# Patient Record
Sex: Female | Born: 1960 | Race: White | Hispanic: No | Marital: Married | Smoking: Never smoker
Health system: Southern US, Community
[De-identification: ages and names within clinical notes are randomized; demographics above are authoritative.]

## PROBLEM LIST (undated history)

## (undated) DIAGNOSIS — R51 Headache: Secondary | ICD-10-CM

## (undated) DIAGNOSIS — R112 Nausea with vomiting, unspecified: Secondary | ICD-10-CM

## (undated) DIAGNOSIS — I10 Essential (primary) hypertension: Secondary | ICD-10-CM

## (undated) DIAGNOSIS — R002 Palpitations: Secondary | ICD-10-CM

## (undated) DIAGNOSIS — Q2112 Patent foramen ovale: Secondary | ICD-10-CM

## (undated) DIAGNOSIS — Z8589 Personal history of malignant neoplasm of other organs and systems: Secondary | ICD-10-CM

## (undated) DIAGNOSIS — F329 Major depressive disorder, single episode, unspecified: Secondary | ICD-10-CM

## (undated) DIAGNOSIS — K219 Gastro-esophageal reflux disease without esophagitis: Secondary | ICD-10-CM

## (undated) DIAGNOSIS — I2510 Atherosclerotic heart disease of native coronary artery without angina pectoris: Secondary | ICD-10-CM

## (undated) DIAGNOSIS — Q211 Atrial septal defect: Secondary | ICD-10-CM

## (undated) DIAGNOSIS — C4492 Squamous cell carcinoma of skin, unspecified: Secondary | ICD-10-CM

## (undated) DIAGNOSIS — F32A Depression, unspecified: Secondary | ICD-10-CM

## (undated) DIAGNOSIS — Z9889 Other specified postprocedural states: Secondary | ICD-10-CM

## (undated) DIAGNOSIS — G2581 Restless legs syndrome: Secondary | ICD-10-CM

## (undated) DIAGNOSIS — K589 Irritable bowel syndrome without diarrhea: Secondary | ICD-10-CM

## (undated) DIAGNOSIS — M1990 Unspecified osteoarthritis, unspecified site: Secondary | ICD-10-CM

## (undated) DIAGNOSIS — R519 Headache, unspecified: Secondary | ICD-10-CM

## (undated) HISTORY — DX: Squamous cell carcinoma of skin, unspecified: C44.92

## (undated) HISTORY — PX: WISDOM TOOTH EXTRACTION: SHX21

## (undated) HISTORY — DX: Atrial septal defect: Q21.1

## (undated) HISTORY — PX: ABDOMINOPLASTY: SUR9

## (undated) HISTORY — DX: Patent foramen ovale: Q21.12

## (undated) HISTORY — DX: Atherosclerotic heart disease of native coronary artery without angina pectoris: I25.10

## (undated) HISTORY — PX: BACK SURGERY: SHX140

## (undated) HISTORY — PX: COLONOSCOPY: SHX174

---
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
## 2000-10-10 ENCOUNTER — Encounter: Payer: Self-pay | Admitting: Neurological Surgery

## 2000-10-10 ENCOUNTER — Inpatient Hospital Stay (HOSPITAL_COMMUNITY): Admission: RE | Admit: 2000-10-10 | Discharge: 2000-10-13 | Payer: Self-pay | Admitting: Neurological Surgery

## 2000-11-01 ENCOUNTER — Encounter: Admission: RE | Admit: 2000-11-01 | Discharge: 2000-11-01 | Payer: Self-pay | Admitting: Neurological Surgery

## 2000-11-01 ENCOUNTER — Encounter: Payer: Self-pay | Admitting: Neurological Surgery

## 2000-12-06 ENCOUNTER — Encounter: Payer: Self-pay | Admitting: Neurological Surgery

## 2000-12-06 ENCOUNTER — Encounter: Admission: RE | Admit: 2000-12-06 | Discharge: 2000-12-06 | Payer: Self-pay | Admitting: Neurological Surgery

## 2001-01-26 ENCOUNTER — Encounter: Payer: Self-pay | Admitting: Neurological Surgery

## 2001-01-26 ENCOUNTER — Encounter: Admission: RE | Admit: 2001-01-26 | Discharge: 2001-01-26 | Payer: Self-pay | Admitting: Neurological Surgery

## 2001-03-30 ENCOUNTER — Encounter: Payer: Self-pay | Admitting: Neurological Surgery

## 2001-03-30 ENCOUNTER — Encounter: Admission: RE | Admit: 2001-03-30 | Discharge: 2001-03-30 | Payer: Self-pay | Admitting: Neurological Surgery

## 2004-03-09 ENCOUNTER — Ambulatory Visit: Payer: Self-pay

## 2004-03-12 ENCOUNTER — Ambulatory Visit: Payer: Self-pay

## 2004-09-27 ENCOUNTER — Ambulatory Visit: Payer: Self-pay

## 2004-11-03 ENCOUNTER — Ambulatory Visit (HOSPITAL_COMMUNITY): Admission: RE | Admit: 2004-11-03 | Discharge: 2004-11-03 | Payer: Self-pay | Admitting: Neurological Surgery

## 2005-01-14 ENCOUNTER — Ambulatory Visit: Payer: Self-pay | Admitting: Neurology

## 2005-01-21 ENCOUNTER — Ambulatory Visit: Payer: Self-pay

## 2005-07-08 ENCOUNTER — Ambulatory Visit: Payer: Self-pay | Admitting: Internal Medicine

## 2005-12-29 ENCOUNTER — Ambulatory Visit: Payer: Self-pay | Admitting: Internal Medicine

## 2006-01-10 ENCOUNTER — Ambulatory Visit: Payer: Self-pay | Admitting: Neurology

## 2006-04-03 ENCOUNTER — Ambulatory Visit: Payer: Self-pay

## 2006-12-19 ENCOUNTER — Ambulatory Visit: Payer: Self-pay | Admitting: Internal Medicine

## 2007-04-13 ENCOUNTER — Ambulatory Visit: Payer: Self-pay

## 2007-04-16 ENCOUNTER — Ambulatory Visit: Payer: Self-pay

## 2007-06-25 ENCOUNTER — Ambulatory Visit: Payer: Self-pay | Admitting: Internal Medicine

## 2008-02-29 ENCOUNTER — Encounter: Payer: Self-pay | Admitting: Internal Medicine

## 2008-03-30 ENCOUNTER — Encounter: Payer: Self-pay | Admitting: Internal Medicine

## 2008-05-20 ENCOUNTER — Ambulatory Visit: Payer: Self-pay

## 2008-06-25 ENCOUNTER — Ambulatory Visit: Payer: Self-pay | Admitting: Internal Medicine

## 2008-07-15 ENCOUNTER — Ambulatory Visit: Payer: Self-pay | Admitting: Internal Medicine

## 2008-07-29 ENCOUNTER — Ambulatory Visit: Payer: Self-pay | Admitting: Gastroenterology

## 2009-05-07 ENCOUNTER — Ambulatory Visit: Payer: Self-pay | Admitting: Internal Medicine

## 2009-06-01 ENCOUNTER — Ambulatory Visit: Payer: Self-pay

## 2009-07-03 ENCOUNTER — Other Ambulatory Visit: Payer: Self-pay | Admitting: Internal Medicine

## 2010-06-08 ENCOUNTER — Ambulatory Visit: Payer: Self-pay

## 2010-07-12 ENCOUNTER — Ambulatory Visit: Payer: Self-pay | Admitting: Internal Medicine

## 2010-07-15 ENCOUNTER — Ambulatory Visit: Payer: Self-pay | Admitting: Internal Medicine

## 2010-08-18 ENCOUNTER — Ambulatory Visit: Payer: Self-pay | Admitting: General Practice

## 2010-08-20 ENCOUNTER — Encounter: Payer: Self-pay | Admitting: General Practice

## 2010-11-17 ENCOUNTER — Ambulatory Visit: Payer: Self-pay | Admitting: Internal Medicine

## 2011-03-18 ENCOUNTER — Other Ambulatory Visit: Payer: Self-pay | Admitting: Internal Medicine

## 2011-07-07 ENCOUNTER — Emergency Department: Payer: Self-pay | Admitting: Unknown Physician Specialty

## 2011-07-18 ENCOUNTER — Ambulatory Visit: Payer: Self-pay | Admitting: Internal Medicine

## 2011-09-12 ENCOUNTER — Other Ambulatory Visit: Payer: Self-pay | Admitting: Internal Medicine

## 2011-09-12 LAB — BASIC METABOLIC PANEL
Anion Gap: 8 (ref 7–16)
BUN: 6 mg/dL — ABNORMAL LOW (ref 7–18)
Calcium, Total: 8.5 mg/dL (ref 8.5–10.1)
Chloride: 102 mmol/L (ref 98–107)
Co2: 29 mmol/L (ref 21–32)
Creatinine: 0.72 mg/dL (ref 0.60–1.30)
EGFR (African American): >60
EGFR (Non-African Amer.): >60
Glucose: 100 mg/dL — ABNORMAL HIGH (ref 65–99)
Osmolality: 275 (ref 275–301)
Potassium: 4.1 mmol/L (ref 3.5–5.1)
Sodium: 139 mmol/L (ref 136–145)

## 2011-09-12 LAB — MAGNESIUM: Magnesium: 1.9 mg/dL

## 2011-09-12 LAB — LIPID PANEL
Cholesterol: 169 mg/dL (ref 0–200)
HDL Cholesterol: 60 mg/dL (ref 40–60)
Ldl Cholesterol, Calc: 94 mg/dL (ref 0–100)
Triglycerides: 77 mg/dL (ref 0–200)
VLDL Cholesterol, Calc: 15 mg/dL (ref 5–40)

## 2011-09-12 LAB — TSH: Thyroid Stimulating Horm: 1.42 u[IU]/mL

## 2011-09-15 ENCOUNTER — Encounter: Payer: Self-pay | Admitting: Physical Medicine and Rehabilitation

## 2011-09-28 ENCOUNTER — Encounter: Payer: Self-pay | Admitting: Physical Medicine and Rehabilitation

## 2011-10-03 ENCOUNTER — Ambulatory Visit: Payer: Self-pay

## 2011-10-29 ENCOUNTER — Encounter: Payer: Self-pay | Admitting: Physical Medicine and Rehabilitation

## 2012-03-08 ENCOUNTER — Other Ambulatory Visit: Payer: Self-pay | Admitting: Neurological Surgery

## 2012-03-08 ENCOUNTER — Other Ambulatory Visit (HOSPITAL_COMMUNITY): Payer: Self-pay | Admitting: Neurological Surgery

## 2012-03-08 DIAGNOSIS — M5416 Radiculopathy, lumbar region: Secondary | ICD-10-CM

## 2012-03-13 ENCOUNTER — Encounter (HOSPITAL_COMMUNITY): Payer: Self-pay

## 2012-03-14 ENCOUNTER — Ambulatory Visit (HOSPITAL_COMMUNITY)
Admission: RE | Admit: 2012-03-14 | Discharge: 2012-03-14 | Disposition: A | Discharge status: Home / Self Care | Payer: 59 | Source: Ambulatory Visit | Attending: Neurological Surgery | Admitting: Neurological Surgery

## 2012-03-14 VITALS — BP 108/64 | HR 63 | Temp 97.6°F | Resp 20 | Ht 64.5 in | Wt 160.0 lb

## 2012-03-14 DIAGNOSIS — M5144 Schmorl's nodes, thoracic region: Secondary | ICD-10-CM | POA: Insufficient documentation

## 2012-03-14 DIAGNOSIS — M545 Low back pain, unspecified: Secondary | ICD-10-CM | POA: Insufficient documentation

## 2012-03-14 DIAGNOSIS — M79609 Pain in unspecified limb: Secondary | ICD-10-CM | POA: Insufficient documentation

## 2012-03-14 DIAGNOSIS — IMO0002 Reserved for concepts with insufficient information to code with codable children: Secondary | ICD-10-CM | POA: Insufficient documentation

## 2012-03-14 DIAGNOSIS — M519 Unspecified thoracic, thoracolumbar and lumbosacral intervertebral disc disorder: Secondary | ICD-10-CM | POA: Insufficient documentation

## 2012-03-14 DIAGNOSIS — M25559 Pain in unspecified hip: Secondary | ICD-10-CM | POA: Insufficient documentation

## 2012-03-14 DIAGNOSIS — M5416 Radiculopathy, lumbar region: Secondary | ICD-10-CM

## 2012-03-14 MED ORDER — IOHEXOL 180 MG/ML  SOLN
20.0000 mL | Freq: Once | INTRAMUSCULAR | Status: AC | PRN
  Administered 2012-03-14: 16 mL via INTRATHECAL

## 2012-03-14 MED ORDER — DIAZEPAM 5 MG PO TABS
10.0000 mg | ORAL_TABLET | Freq: Once | ORAL | Status: AC
  Administered 2012-03-14: 10 mg via ORAL

## 2012-03-14 MED ORDER — ONDANSETRON HCL 4 MG/2ML IJ SOLN: 4.0000 mg | Freq: Four times a day (QID) | INTRAMUSCULAR | Status: DC | PRN

## 2012-03-14 MED ORDER — HYDROCODONE-ACETAMINOPHEN 5-325 MG PO TABS
1.0000 | ORAL_TABLET | ORAL | Status: DC | PRN
  Administered 2012-03-14: 1 via ORAL

## 2012-03-14 MED ORDER — HYDROCODONE-ACETAMINOPHEN 5-325 MG PO TABS
ORAL_TABLET | ORAL | Status: AC
  Filled 2012-03-14: qty 1

## 2012-03-14 MED ORDER — DIAZEPAM 5 MG PO TABS
ORAL_TABLET | ORAL | Status: AC
  Filled 2012-03-14: qty 2
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
## 2012-03-16 ENCOUNTER — Other Ambulatory Visit: Payer: Self-pay | Admitting: Neurology

## 2012-03-16 LAB — COMPREHENSIVE METABOLIC PANEL
Albumin: 3.8 g/dL (ref 3.4–5.0)
Alkaline Phosphatase: 98 U/L (ref 50–136)
Anion Gap: 7 (ref 7–16)
BUN: 8 mg/dL (ref 7–18)
Bilirubin,Total: 0.4 mg/dL (ref 0.2–1.0)
Calcium, Total: 9.1 mg/dL (ref 8.5–10.1)
Chloride: 101 mmol/L (ref 98–107)
Co2: 30 mmol/L (ref 21–32)
Creatinine: 0.7 mg/dL (ref 0.60–1.30)
EGFR (African American): >60
EGFR (Non-African Amer.): >60
Glucose: 98 mg/dL (ref 65–99)
Osmolality: 274 (ref 275–301)
Potassium: 3.6 mmol/L (ref 3.5–5.1)
SGOT(AST): 15 U/L (ref 15–37)
SGPT (ALT): 29 U/L (ref 12–78)
Sodium: 138 mmol/L (ref 136–145)
Total Protein: 7.2 g/dL (ref 6.4–8.2)

## 2012-03-16 LAB — CBC WITH DIFFERENTIAL/PLATELET
Basophil #: 0.1 10*3/uL (ref 0.0–0.1)
Basophil %: 0.7 %
Eosinophil #: 0.1 10*3/uL (ref 0.0–0.7)
Eosinophil %: 1.2 %
HCT: 42.2 % (ref 35.0–47.0)
HGB: 14.4 g/dL (ref 12.0–16.0)
Lymphocyte #: 1.1 10*3/uL (ref 1.0–3.6)
Lymphocyte %: 14.3 %
MCH: 32.2 pg (ref 26.0–34.0)
MCHC: 34.1 g/dL (ref 32.0–36.0)
MCV: 95 fL (ref 80–100)
Monocyte #: 0.4 x10 3/mm (ref 0.2–0.9)
Monocyte %: 5.7 %
Neutrophil #: 5.9 10*3/uL (ref 1.4–6.5)
Neutrophil %: 78.1 %
Platelet: 193 10*3/uL (ref 150–440)
RBC: 4.46 10*6/uL (ref 3.80–5.20)
RDW: 12.3 % (ref 11.5–14.5)
WBC: 7.6 10*3/uL (ref 3.6–11.0)

## 2012-03-19 ENCOUNTER — Ambulatory Visit: Payer: Self-pay | Admitting: Neurology

## 2012-05-15 ENCOUNTER — Encounter: Payer: Self-pay | Admitting: Physician Assistant

## 2012-05-28 ENCOUNTER — Emergency Department: Payer: Self-pay | Admitting: Emergency Medicine

## 2012-05-28 LAB — COMPREHENSIVE METABOLIC PANEL
Albumin: 3.6 g/dL (ref 3.4–5.0)
Alkaline Phosphatase: 79 U/L (ref 50–136)
Anion Gap: 5 — ABNORMAL LOW (ref 7–16)
BUN: 8 mg/dL (ref 7–18)
Bilirubin,Total: 0.4 mg/dL (ref 0.2–1.0)
Calcium, Total: 8.5 mg/dL (ref 8.5–10.1)
Chloride: 106 mmol/L (ref 98–107)
Co2: 29 mmol/L (ref 21–32)
Creatinine: 0.72 mg/dL (ref 0.60–1.30)
EGFR (African American): >60
EGFR (Non-African Amer.): >60
Glucose: 97 mg/dL (ref 65–99)
Osmolality: 278 (ref 275–301)
Potassium: 3.6 mmol/L (ref 3.5–5.1)
SGOT(AST): 23 U/L (ref 15–37)
SGPT (ALT): 38 U/L (ref 12–78)
Sodium: 140 mmol/L (ref 136–145)
Total Protein: 7.2 g/dL (ref 6.4–8.2)

## 2012-05-28 LAB — CBC
HCT: 43.8 % (ref 35.0–47.0)
HGB: 14.8 g/dL (ref 12.0–16.0)
MCH: 32 pg (ref 26.0–34.0)
MCHC: 33.8 g/dL (ref 32.0–36.0)
MCV: 95 fL (ref 80–100)
Platelet: 166 10*3/uL (ref 150–440)
RBC: 4.62 10*6/uL (ref 3.80–5.20)
RDW: 12.7 % (ref 11.5–14.5)
WBC: 5.6 10*3/uL (ref 3.6–11.0)

## 2012-05-28 LAB — LIPASE, BLOOD: Lipase: 167 U/L (ref 73–393)

## 2012-05-30 ENCOUNTER — Encounter: Payer: Self-pay | Admitting: Physician Assistant

## 2012-06-03 ENCOUNTER — Ambulatory Visit: Payer: Self-pay

## 2012-06-12 ENCOUNTER — Ambulatory Visit: Payer: Self-pay | Admitting: Gastroenterology

## 2012-06-13 LAB — PATHOLOGY REPORT

## 2012-06-20 DIAGNOSIS — N870 Mild cervical dysplasia: Secondary | ICD-10-CM | POA: Insufficient documentation

## 2012-06-20 DIAGNOSIS — K589 Irritable bowel syndrome without diarrhea: Secondary | ICD-10-CM | POA: Insufficient documentation

## 2012-06-20 DIAGNOSIS — M519 Unspecified thoracic, thoracolumbar and lumbosacral intervertebral disc disorder: Secondary | ICD-10-CM | POA: Insufficient documentation

## 2012-06-22 ENCOUNTER — Ambulatory Visit: Payer: Self-pay | Admitting: Gastroenterology

## 2012-06-25 ENCOUNTER — Other Ambulatory Visit: Payer: Self-pay | Admitting: Gastroenterology

## 2012-06-25 LAB — HCG, QUANTITATIVE, PREGNANCY: Beta Hcg, Quant.: 2 m[IU]/mL

## 2012-06-26 ENCOUNTER — Ambulatory Visit: Payer: Self-pay | Admitting: Gastroenterology

## 2012-06-30 ENCOUNTER — Encounter: Payer: Self-pay | Admitting: Physician Assistant

## 2012-07-04 ENCOUNTER — Emergency Department: Payer: Self-pay | Admitting: Emergency Medicine

## 2012-07-04 LAB — URINALYSIS, COMPLETE
Bilirubin,UR: NEGATIVE
Blood: NEGATIVE
Glucose,UR: NEGATIVE mg/dL (ref 0–75)
Ketone: NEGATIVE
Leukocyte Esterase: NEGATIVE
Nitrite: NEGATIVE
Ph: 7 (ref 4.5–8.0)
Protein: NEGATIVE
RBC,UR: <1 /HPF (ref 0–5)
Specific Gravity: 1.012 (ref 1.003–1.030)
Squamous Epithelial: 1
WBC UR: NONE SEEN /HPF (ref 0–5)

## 2012-07-18 ENCOUNTER — Ambulatory Visit: Payer: Self-pay | Admitting: Internal Medicine

## 2012-10-03 ENCOUNTER — Ambulatory Visit: Payer: Self-pay

## 2013-07-31 ENCOUNTER — Ambulatory Visit: Payer: Self-pay | Admitting: Internal Medicine

## 2013-12-02 ENCOUNTER — Ambulatory Visit: Payer: Self-pay

## 2014-04-03 DIAGNOSIS — R5383 Other fatigue: Secondary | ICD-10-CM | POA: Insufficient documentation

## 2014-04-03 DIAGNOSIS — M1990 Unspecified osteoarthritis, unspecified site: Secondary | ICD-10-CM | POA: Insufficient documentation

## 2014-05-13 ENCOUNTER — Ambulatory Visit: Payer: Self-pay | Admitting: Family Medicine

## 2014-05-28 DIAGNOSIS — M7541 Impingement syndrome of right shoulder: Secondary | ICD-10-CM | POA: Insufficient documentation

## 2014-05-28 DIAGNOSIS — M65811 Other synovitis and tenosynovitis, right shoulder: Secondary | ICD-10-CM | POA: Insufficient documentation

## 2014-06-03 ENCOUNTER — Encounter: Payer: Self-pay | Admitting: Surgery

## 2014-06-30 ENCOUNTER — Encounter: Payer: Self-pay | Admitting: Surgery

## 2014-07-29 ENCOUNTER — Encounter
Admit: 2014-07-29 | Disposition: A | Discharge status: Home / Self Care | Payer: Self-pay | Attending: Surgery | Admitting: Surgery

## 2014-09-24 DIAGNOSIS — N94819 Vulvodynia, unspecified: Secondary | ICD-10-CM | POA: Insufficient documentation

## 2014-12-04 DIAGNOSIS — S43431D Superior glenoid labrum lesion of right shoulder, subsequent encounter: Secondary | ICD-10-CM | POA: Insufficient documentation

## 2014-12-18 ENCOUNTER — Ambulatory Visit: Payer: 59 | Attending: Surgery

## 2014-12-18 DIAGNOSIS — M25511 Pain in right shoulder: Secondary | ICD-10-CM | POA: Diagnosis present

## 2014-12-18 DIAGNOSIS — M542 Cervicalgia: Secondary | ICD-10-CM | POA: Insufficient documentation

## 2014-12-18 NOTE — Therapy (Signed)
[place] MAIN [facility] SERVICES [address] [place], [place], [address] Phone: [phone]   Fax:  [phone]  Physical Therapy Evaluation  Patient Details  Name: Lori Morales MRN: [id number] Date of Birth: Dec 16, 1960 Referring Provider:  Corky Mull, MD  Encounter Date: 12/18/2014      PT End of Session - 12/18/14 1557    Visit Number 1   Number of Visits 17   Date for PT Re-Evaluation 02/12/15   PT Start Time 1120   PT Stop Time 1201   PT Time Calculation (min) 41 min   Activity Tolerance Patient tolerated treatment well  improved ROM and reduced pain       History reviewed. No pertinent past medical history.  History reviewed. No pertinent past surgical history.  There were no vitals filed for this visit.  Visit Diagnosis:  Pain in joint, shoulder region, right - Plan: PT plan of care cert/re-cert  Cervicalgia - Plan: PT plan of care cert/re-cert      Subjective Assessment - 12/18/14 1550    Subjective pt reports she has had a few months of shoulder pain relief following her last PT episode. Her pain returned about 2 months ago. she went to see an orthopedic who performed MRI which showed a bone spur and a slap tear. she has not had surgery yet, but would like reudced pain prior to surgery. pt is being evaluated today for R shoulder pain.    Patient Stated Goals reduce pain before surgery   Currently in Pain? Yes   Pain Score 5    Pain Location --  R shoulder lateral, R upper trap            [facility] PT Assessment - 12/18/14 1553    Assessment   Medical Diagnosis R shoulder pain/SLAP tear   Hand Dominance Right   Prior Therapy PT   Precautions   Precautions None   Balance Screen   Has the patient fallen in the past 6 months No   Has the patient had a decrease in activity level because of a fear of falling?  No   Is the patient reluctant to leave their home because of a fear of falling?  No   Home  [place] residence   Living Arrangements Alone   Prior Function   Level of Independence Independent  exercises daily    Vocation Full time employment  nurse, mostly desk    R shoulder ROM: elevation 110 , abduction 105, ER 50, IR to L4 Neck ROM: R rotation 50 deg, L rotation 30 deg, L sidebend 15 deg, R sidebend 40 deg, flexion WNL, extension 30 deg + hawkins kennedy, +neers Pain in subacromal space.  Pain/ trigger points R upper trap, R biceps, R deltoid.  MMT: flexion 3-/5 likely due to pain  Abduction 3-/5 likely due to pain ER 4+/5 IR 5/5 Posture: good Manual therapy: Soft tissue massage to R upper trapezius Patient received dry needling therapy education and acknowledged understanding of risks and benefits of dry needling therapy prior to receiving treatment. Patient voiced understanding of treatment options and elected to proceed with dry needling therapy.  Trigger point dry needling to R upper trap, biceps and deltoid Pt demonstrated improved shoulder flexion to 160 deg, and L sidebend of the C spine to 25 deg as well as reports of a great reduction in pain  Therex: Upper trap, levator, SCM stretch 30s x 2 each  [place] Adult PT Treatment/Exercise - 12/18/14 0001    Manual Therapy   Manual Therapy Soft tissue mobilization  t   Manual therapy comments STM to R upper trap following trigger point dry needling to upper trap and lateral deltoid and R biceps                PT Education - 12/18/14 1556    Education provided Yes   Education Details dry needling educaiton, cervical stretching   Person(s) Educated Patient   Methods Explanation   Comprehension Verbalized understanding             PT Long Term Goals - 12/18/14 1606    PT LONG TERM GOAL #1   Title pt will be independent with HEP to reduce pain, improve flexibility and maximize function.    Time 8   Period Weeks   Status New   PT LONG TERM  GOAL #2   Title pt will elevate shoulder to 160 deg flexion with pain <3/10 with normal scapular/humeral rhythem.    Time 8   Period Weeks   Status New   PT LONG TERM GOAL #3   Title pt will be able to do her hair with sufficient ER and pain <3/10 in R shoulder   Time 8   Period Weeks   Status New               Plan - 12/18/14 1558    Clinical Impression Statement pt presents with R shoulder pain, neck pain, tightness 2 weeks prior to R shoulder decompression/debridement. pt demonstrates pain, impaired ROM of the R shoulder and neck, increased tightness, trigger points, reduced flexibility and strength. pt would benefit from skilled PT services to reduce pain prior to surgery to maximize function as well as return pt to PLOF following surgery.    Pt will benefit from skilled therapeutic intervention in order to improve on the following deficits Hypomobility;Pain;Impaired UE functional use;Decreased strength;Decreased activity tolerance;Decreased range of motion;Impaired flexibility   Rehab Potential Good   PT Frequency 2x / week   PT Duration 8 weeks   PT Treatment/Interventions Aquatic Therapy;ADLs/Self Care Home Management;Cryotherapy;Electrical Stimulation;Iontophoresis 4mg /ml Dexamethasone;Moist Heat;Therapeutic exercise;Therapeutic activities;Ultrasound;Neuromuscular re-education;Manual techniques;Taping;Dry needling;Passive range of motion   PT Next Visit Plan re-eval if s/p surgery         Problem List There are no active problems to display for this patient.  Gorden Harms. Lori Morales, PT, DPT [phone]  Lori Morales 12/18/2014, 4:19 PM  [place] [facility] MAIN [facility] SERVICES [address] [place], [place], [address] Phone: [phone]   Fax:  [phone]

## 2014-12-18 NOTE — Patient Instructions (Signed)
Upper trap, levator and SCM stretching 30s x 2 bilaterally from [email or web address]

## 2014-12-30 ENCOUNTER — Inpatient Hospital Stay (HOSPITAL_COMMUNITY)
Admission: RE | Admit: 2014-12-30 | Discharge: 2014-12-30 | Disposition: A | Discharge status: Home / Self Care | Payer: 59 | Source: Ambulatory Visit

## 2014-12-31 ENCOUNTER — Ambulatory Visit: Admission: RE | Admit: 2014-12-31 | Payer: 59 | Source: Ambulatory Visit | Admitting: Surgery

## 2014-12-31 ENCOUNTER — Encounter (HOSPITAL_COMMUNITY)
Admission: RE | Admit: 2014-12-31 | Discharge: 2014-12-31 | Disposition: A | Discharge status: Home / Self Care | Payer: 59 | Source: Ambulatory Visit | Attending: Orthopedic Surgery | Admitting: Orthopedic Surgery

## 2014-12-31 ENCOUNTER — Encounter (HOSPITAL_COMMUNITY): Payer: Self-pay

## 2014-12-31 ENCOUNTER — Encounter: Admission: RE | Payer: Self-pay | Source: Ambulatory Visit

## 2014-12-31 ENCOUNTER — Other Ambulatory Visit (HOSPITAL_COMMUNITY): Payer: Self-pay | Admitting: *Deleted

## 2014-12-31 DIAGNOSIS — K219 Gastro-esophageal reflux disease without esophagitis: Secondary | ICD-10-CM | POA: Diagnosis not present

## 2014-12-31 DIAGNOSIS — Z79899 Other long term (current) drug therapy: Secondary | ICD-10-CM | POA: Diagnosis not present

## 2014-12-31 DIAGNOSIS — M19011 Primary osteoarthritis, right shoulder: Secondary | ICD-10-CM | POA: Diagnosis not present

## 2014-12-31 DIAGNOSIS — F329 Major depressive disorder, single episode, unspecified: Secondary | ICD-10-CM | POA: Diagnosis not present

## 2014-12-31 DIAGNOSIS — M7541 Impingement syndrome of right shoulder: Secondary | ICD-10-CM | POA: Diagnosis present

## 2014-12-31 DIAGNOSIS — R51 Headache: Secondary | ICD-10-CM | POA: Diagnosis not present

## 2014-12-31 HISTORY — DX: Headache, unspecified: R51.9

## 2014-12-31 HISTORY — DX: Unspecified osteoarthritis, unspecified site: M19.90

## 2014-12-31 HISTORY — DX: Restless legs syndrome: G25.81

## 2014-12-31 HISTORY — DX: Depression, unspecified: F32.A

## 2014-12-31 HISTORY — DX: Irritable bowel syndrome, unspecified: K58.9

## 2014-12-31 HISTORY — DX: Headache: R51

## 2014-12-31 HISTORY — DX: Gastro-esophageal reflux disease without esophagitis: K21.9

## 2014-12-31 HISTORY — DX: Major depressive disorder, single episode, unspecified: F32.9

## 2014-12-31 HISTORY — DX: Palpitations: R00.2

## 2014-12-31 LAB — APTT: aPTT: 31 seconds (ref 24–37)

## 2014-12-31 LAB — CBC WITH DIFFERENTIAL/PLATELET
Basophils Absolute: 0.1 10*3/uL (ref 0.0–0.1)
Basophils Relative: 1 % (ref 0–1)
Eosinophils Absolute: 0.1 10*3/uL (ref 0.0–0.7)
Eosinophils Relative: 2 % (ref 0–5)
HCT: 43.4 % (ref 36.0–46.0)
Hemoglobin: 14.6 g/dL (ref 12.0–15.0)
Lymphocytes Relative: 29 % (ref 12–46)
Lymphs Abs: 1.6 10*3/uL (ref 0.7–4.0)
MCH: 30.7 pg (ref 26.0–34.0)
MCHC: 33.6 g/dL (ref 30.0–36.0)
MCV: 91.2 fL (ref 78.0–100.0)
Monocytes Absolute: 0.4 10*3/uL (ref 0.1–1.0)
Monocytes Relative: 8 % (ref 3–12)
Neutro Abs: 3.3 10*3/uL (ref 1.7–7.7)
Neutrophils Relative %: 60 % (ref 43–77)
Platelets: 183 10*3/uL (ref 150–400)
RBC: 4.76 MIL/uL (ref 3.87–5.11)
RDW: 12.3 % (ref 11.5–15.5)
WBC: 5.5 10*3/uL (ref 4.0–10.5)

## 2014-12-31 LAB — COMPREHENSIVE METABOLIC PANEL
ALT: 22 U/L (ref 14–54)
AST: 22 U/L (ref 15–41)
Albumin: 3.9 g/dL (ref 3.5–5.0)
Alkaline Phosphatase: 70 U/L (ref 38–126)
Anion gap: 5 (ref 5–15)
BUN: 8 mg/dL (ref 6–20)
CO2: 28 mmol/L (ref 22–32)
Calcium: 9.2 mg/dL (ref 8.9–10.3)
Chloride: 107 mmol/L (ref 101–111)
Creatinine, Ser: 0.69 mg/dL (ref 0.44–1.00)
GFR calc Af Amer: >60 mL/min (ref >60)
GFR calc non Af Amer: >60 mL/min (ref >60)
Glucose, Bld: 94 mg/dL (ref 65–99)
Potassium: 4 mmol/L (ref 3.5–5.1)
Sodium: 140 mmol/L (ref 135–145)
Total Bilirubin: 0.4 mg/dL (ref 0.3–1.2)
Total Protein: 6.7 g/dL (ref 6.5–8.1)

## 2014-12-31 LAB — PROTIME-INR
INR: 1 (ref 0.00–1.49)
Prothrombin Time: 13.4 seconds (ref 11.6–15.2)

## 2014-12-31 SURGERY — SHOULDER ARTHROSCOPY WITH SUBACROMIAL DECOMPRESSION
Anesthesia: Choice | Laterality: Right

## 2014-12-31 NOTE — Progress Notes (Signed)
Pt denies chest pain or sob. States she has PVC's/PAC's occasionally over the years, usually due to stress or too much caffeine. She did have a stress test in February, 2016 due to family history of early deaths from MI's. She states it was negative. Will request copy of stress test from [facility].

## 2014-12-31 NOTE — Pre-Procedure Instructions (Signed)
Lori Morales  12/31/2014     Your procedure is scheduled on Thursday, January 01, 2015 at 10:00 AM.   Report to [facility] Entrance "A" Admitting Office at 8:00 AM.   Call this number if you have problems the morning of surgery: [phone]     Remember:  Do not eat food or drink liquids after midnight tonight.  Take these medicines the morning of surgery with A SIP OF WATER: Mimvey, Pantoprazole (Protonix)   Do not wear jewelry, make-up or nail polish.  Do not wear lotions, powders, or perfumes.  You may NOT wear deodorant.  Do not shave 48 hours prior to surgery.    Do not bring valuables to the hospital.  [facility] is not responsible for any belongings or valuables.  Contacts, dentures or bridgework may not be worn into surgery.  Leave your suitcase in the car.  After surgery it may be brought to your room.  For patients admitted to the hospital, discharge time will be determined by your treatment team.  Patients discharged the day of surgery will not be allowed to drive home.   Special instructions:  [place] - Preparing for Surgery  Before surgery, you can play an important role.  Because skin is not sterile, your skin needs to be as free of germs as possible.  You can reduce the number of germs on you skin by washing with CHG (chlorahexidine gluconate) soap before surgery.  CHG is an antiseptic cleaner which kills germs and bonds with the skin to continue killing germs even after washing.  Please DO NOT use if you have an allergy to CHG or antibacterial soaps.  If your skin becomes reddened/irritated stop using the CHG and inform your nurse when you arrive at Short Stay.  Do not shave (including legs and underarms) for at least 48 hours prior to the first CHG shower.  You may shave your face.  Please follow these instructions carefully:   1.  Shower with CHG Soap the night before surgery and the                                morning of Surgery.  2.  If  you choose to wash your hair, wash your hair first as usual with your       normal shampoo.  3.  After you shampoo, rinse your hair and body thoroughly to remove the                      Shampoo.  4.  Use CHG as you would any other liquid soap.  You can apply chg directly       to the skin and wash gently with scrungie or a clean washcloth.  5.  Apply the CHG Soap to your body ONLY FROM THE NECK DOWN.        Do not use on open wounds or open sores.  Avoid contact with your eyes, ears, mouth and genitals (private parts).  Wash genitals (private parts) with your normal soap.  6.  Wash thoroughly, paying special attention to the area where your surgery        will be performed.  7.  Thoroughly rinse your body with warm water from the neck down.  8.  DO NOT shower/wash with your normal soap after using and rinsing off       the CHG  Soap.  9.  Pat yourself dry with a clean towel.            10.  Wear clean pajamas.            11.  Place clean sheets on your bed the night of your first shower and do not        sleep with pets.  Day of Surgery  Do not apply any lotions/deodorants the morning of surgery.  Please wear clean clothes to the hospital.    Please read over the following fact sheets that you were given. Pain Booklet, Coughing and Deep Breathing and Surgical Site Infection Prevention

## 2015-01-01 ENCOUNTER — Encounter (HOSPITAL_COMMUNITY): Payer: Self-pay | Admitting: Surgery

## 2015-01-01 ENCOUNTER — Encounter (HOSPITAL_COMMUNITY)
Admission: RE | Disposition: A | Discharge status: Home / Self Care | Payer: Self-pay | Source: Ambulatory Visit | Attending: Orthopedic Surgery

## 2015-01-01 ENCOUNTER — Ambulatory Visit (HOSPITAL_COMMUNITY): Payer: 59 | Admitting: Anesthesiology

## 2015-01-01 ENCOUNTER — Ambulatory Visit (HOSPITAL_COMMUNITY)
Admission: RE | Admit: 2015-01-01 | Discharge: 2015-01-01 | Disposition: A | Discharge status: Home / Self Care | Payer: 59 | Source: Ambulatory Visit | Attending: Orthopedic Surgery | Admitting: Orthopedic Surgery

## 2015-01-01 DIAGNOSIS — R51 Headache: Secondary | ICD-10-CM | POA: Insufficient documentation

## 2015-01-01 DIAGNOSIS — M7541 Impingement syndrome of right shoulder: Secondary | ICD-10-CM | POA: Diagnosis not present

## 2015-01-01 DIAGNOSIS — M19011 Primary osteoarthritis, right shoulder: Secondary | ICD-10-CM | POA: Insufficient documentation

## 2015-01-01 DIAGNOSIS — K219 Gastro-esophageal reflux disease without esophagitis: Secondary | ICD-10-CM | POA: Insufficient documentation

## 2015-01-01 DIAGNOSIS — Z79899 Other long term (current) drug therapy: Secondary | ICD-10-CM | POA: Insufficient documentation

## 2015-01-01 DIAGNOSIS — F329 Major depressive disorder, single episode, unspecified: Secondary | ICD-10-CM | POA: Insufficient documentation

## 2015-01-01 HISTORY — DX: Other specified postprocedural states: Z98.890

## 2015-01-01 HISTORY — DX: Other specified postprocedural states: R11.2

## 2015-01-01 SURGERY — SHOULDER ARTHROSCOPY WITH SUBACROMIAL DECOMPRESSION AND DISTAL CLAVICLE EXCISION
Anesthesia: General | Site: Shoulder | Laterality: Right

## 2015-01-01 MED ORDER — ONDANSETRON 4 MG PO TBDP: 4.0000 mg | ORAL_TABLET | Freq: Three times a day (TID) | ORAL | Status: DC | PRN

## 2015-01-01 MED ORDER — NEOSTIGMINE METHYLSULFATE 10 MG/10ML IV SOLN
INTRAVENOUS | Status: DC | PRN
Start: 2015-01-01 — End: 2015-01-01
  Administered 2015-01-01: 3 mg via INTRAVENOUS

## 2015-01-01 MED ORDER — PROPOFOL 10 MG/ML IV BOLUS
INTRAVENOUS | Status: DC | PRN
  Administered 2015-01-01: 140 mg via INTRAVENOUS

## 2015-01-01 MED ORDER — MIDAZOLAM HCL 5 MG/ML IJ SOLN: 1.0000 mg | Freq: Once | INTRAMUSCULAR | Status: DC

## 2015-01-01 MED ORDER — ONDANSETRON HCL 4 MG PO TABS: 4.0000 mg | ORAL_TABLET | Freq: Three times a day (TID) | ORAL | Status: DC | PRN

## 2015-01-01 MED ORDER — DIAZEPAM 5 MG PO TABS: 2.5000 mg | ORAL_TABLET | Freq: Four times a day (QID) | ORAL | Status: DC | PRN

## 2015-01-01 MED ORDER — LACTATED RINGERS IV SOLN
INTRAVENOUS | Status: DC
Start: 2015-01-01 — End: 2015-01-01
  Administered 2015-01-01: 09:00:00 via INTRAVENOUS

## 2015-01-01 MED ORDER — LACTATED RINGERS IV SOLN
INTRAVENOUS | Status: DC | PRN
  Administered 2015-01-01: 10:00:00 via INTRAVENOUS

## 2015-01-01 MED ORDER — ROCURONIUM BROMIDE 100 MG/10ML IV SOLN
INTRAVENOUS | Status: DC | PRN
  Administered 2015-01-01: 40 mg via INTRAVENOUS

## 2015-01-01 MED ORDER — GLYCOPYRROLATE 0.2 MG/ML IJ SOLN
INTRAMUSCULAR | Status: AC
  Filled 2015-01-01: qty 2

## 2015-01-01 MED ORDER — METOCLOPRAMIDE HCL 5 MG/ML IJ SOLN
INTRAMUSCULAR | Status: AC
  Filled 2015-01-01: qty 2

## 2015-01-01 MED ORDER — STERILE WATER FOR IRRIGATION IR SOLN
Status: DC | PRN
  Administered 2015-01-01: 15 mL

## 2015-01-01 MED ORDER — ONDANSETRON HCL 4 MG/2ML IJ SOLN
INTRAMUSCULAR | Status: AC
  Filled 2015-01-01: qty 2

## 2015-01-01 MED ORDER — LIDOCAINE HCL (CARDIAC) 20 MG/ML IV SOLN
INTRAVENOUS | Status: DC | PRN
  Administered 2015-01-01: 80 mg via INTRAVENOUS

## 2015-01-01 MED ORDER — MIDAZOLAM HCL 2 MG/2ML IJ SOLN
INTRAMUSCULAR | Status: AC
  Administered 2015-01-01: 1 mg
  Filled 2015-01-01: qty 2

## 2015-01-01 MED ORDER — CHLORHEXIDINE GLUCONATE 4 % EX LIQD
60.0000 mL | Freq: Once | CUTANEOUS | Status: DC
Start: 2015-01-01 — End: 2015-01-01

## 2015-01-01 MED ORDER — PANTOPRAZOLE SODIUM 40 MG IV SOLR
40.0000 mg | INTRAVENOUS | Status: AC
  Administered 2015-01-01: 40 mg via INTRAVENOUS
  Filled 2015-01-01: qty 40

## 2015-01-01 MED ORDER — CEFAZOLIN SODIUM-DEXTROSE 2-3 GM-% IV SOLR
INTRAVENOUS | Status: AC
Start: 2015-01-01 — End: 2015-01-01
  Administered 2015-01-01: 2 g via INTRAVENOUS
  Filled 2015-01-01: qty 50

## 2015-01-01 MED ORDER — LIDOCAINE HCL (CARDIAC) 20 MG/ML IV SOLN
INTRAVENOUS | Status: AC
  Filled 2015-01-01: qty 5

## 2015-01-01 MED ORDER — OXYCODONE HCL 5 MG PO TABS: 5.0000 mg | ORAL_TABLET | Freq: Once | ORAL | Status: DC | PRN

## 2015-01-01 MED ORDER — ONDANSETRON HCL 4 MG/2ML IJ SOLN
4.0000 mg | Freq: Once | INTRAMUSCULAR | Status: AC | PRN
  Administered 2015-01-01: 4 mg via INTRAVENOUS

## 2015-01-01 MED ORDER — DIPHENHYDRAMINE HCL 50 MG/ML IJ SOLN
12.5000 mg | INTRAMUSCULAR | Status: AC | PRN
  Administered 2015-01-01 (×2): 12.5 mg via INTRAVENOUS

## 2015-01-01 MED ORDER — METOCLOPRAMIDE HCL 5 MG/ML IJ SOLN
10.0000 mg | Freq: Once | INTRAMUSCULAR | Status: AC
  Administered 2015-01-01: 10 mg via INTRAVENOUS

## 2015-01-01 MED ORDER — FENTANYL CITRATE (PF) 100 MCG/2ML IJ SOLN
25.0000 ug | Freq: Once | INTRAMUSCULAR | Status: AC
Start: 2015-01-01 — End: 2015-01-01
  Administered 2015-01-01: 25 ug via INTRAVENOUS

## 2015-01-01 MED ORDER — PROMETHAZINE HCL 25 MG/ML IJ SOLN
6.2500 mg | INTRAMUSCULAR | Status: AC | PRN
  Administered 2015-01-01 (×2): 6.25 mg via INTRAVENOUS

## 2015-01-01 MED ORDER — PROPOFOL 10 MG/ML IV BOLUS
INTRAVENOUS | Status: AC
  Filled 2015-01-01: qty 20

## 2015-01-01 MED ORDER — DEXAMETHASONE SODIUM PHOSPHATE 10 MG/ML IJ SOLN
10.0000 mg | Freq: Once | INTRAMUSCULAR | Status: AC
  Administered 2015-01-01: 10 mg via INTRAVENOUS

## 2015-01-01 MED ORDER — ROCURONIUM BROMIDE 50 MG/5ML IV SOLN
INTRAVENOUS | Status: AC
  Filled 2015-01-01: qty 1

## 2015-01-01 MED ORDER — PROMETHAZINE HCL 25 MG/ML IJ SOLN
6.2500 mg | INTRAMUSCULAR | Status: DC | PRN
  Administered 2015-01-01: 6.25 mg via INTRAVENOUS

## 2015-01-01 MED ORDER — LACTATED RINGERS IV BOLUS (SEPSIS)
500.0000 mL | Freq: Once | INTRAVENOUS | Status: AC
  Administered 2015-01-01: 500 mL via INTRAVENOUS

## 2015-01-01 MED ORDER — GLYCOPYRROLATE 0.2 MG/ML IJ SOLN
INTRAMUSCULAR | Status: DC | PRN
  Administered 2015-01-01: .4 mg via INTRAVENOUS

## 2015-01-01 MED ORDER — PANTOPRAZOLE SODIUM 40 MG IV SOLR: 40.0000 mg | INTRAVENOUS | Status: DC

## 2015-01-01 MED ORDER — PROMETHAZINE HCL 25 MG/ML IJ SOLN
INTRAMUSCULAR | Status: AC
  Filled 2015-01-01: qty 1

## 2015-01-01 MED ORDER — NEOSTIGMINE METHYLSULFATE 10 MG/10ML IV SOLN
INTRAVENOUS | Status: AC
  Filled 2015-01-01: qty 1

## 2015-01-01 MED ORDER — FENTANYL CITRATE (PF) 100 MCG/2ML IJ SOLN
INTRAMUSCULAR | Status: AC
  Administered 2015-01-01: 25 ug via INTRAVENOUS
  Filled 2015-01-01: qty 2

## 2015-01-01 MED ORDER — FENTANYL CITRATE (PF) 100 MCG/2ML IJ SOLN
INTRAMUSCULAR | Status: DC | PRN
  Administered 2015-01-01 (×2): 50 ug via INTRAVENOUS

## 2015-01-01 MED ORDER — DIPHENHYDRAMINE HCL 50 MG/ML IJ SOLN
INTRAMUSCULAR | Status: AC
  Filled 2015-01-01: qty 1

## 2015-01-01 MED ORDER — FENTANYL CITRATE (PF) 250 MCG/5ML IJ SOLN
INTRAMUSCULAR | Status: AC
  Filled 2015-01-01: qty 5

## 2015-01-01 MED ORDER — ONDANSETRON HCL 4 MG/2ML IJ SOLN
INTRAMUSCULAR | Status: DC | PRN
  Administered 2015-01-01: 4 mg via INTRAVENOUS

## 2015-01-01 MED ORDER — HYDROCODONE-ACETAMINOPHEN 5-325 MG PO TABS: 1.0000 | ORAL_TABLET | Freq: Four times a day (QID) | ORAL | Status: DC | PRN

## 2015-01-01 MED ORDER — MIDAZOLAM HCL 2 MG/2ML IJ SOLN
INTRAMUSCULAR | Status: AC
  Filled 2015-01-01: qty 4

## 2015-01-01 MED ORDER — SODIUM CHLORIDE 0.9 % IR SOLN
Status: DC | PRN
  Administered 2015-01-01 (×2): 3000 mL

## 2015-01-01 MED ORDER — DEXAMETHASONE SODIUM PHOSPHATE 10 MG/ML IJ SOLN
INTRAMUSCULAR | Status: AC
  Filled 2015-01-01: qty 1

## 2015-01-01 MED ORDER — CEFAZOLIN SODIUM-DEXTROSE 2-3 GM-% IV SOLR: 2.0000 g | INTRAVENOUS | Status: DC

## 2015-01-01 MED ORDER — SCOPOLAMINE 1 MG/3DAYS TD PT72
MEDICATED_PATCH | TRANSDERMAL | Status: DC | PRN
  Administered 2015-01-01: 1 via TRANSDERMAL

## 2015-01-01 MED ORDER — LACTATED RINGERS IV SOLN: INTRAVENOUS | Status: DC

## 2015-01-01 MED ORDER — OXYCODONE-ACETAMINOPHEN 5-325 MG PO TABS: 1.0000 | ORAL_TABLET | ORAL | Status: DC | PRN

## 2015-01-01 MED ORDER — BUPIVACAINE-EPINEPHRINE (PF) 0.5% -1:200000 IJ SOLN
INTRAMUSCULAR | Status: DC | PRN
  Administered 2015-01-01: 20 mL via PERINEURAL

## 2015-01-01 MED ORDER — HYDROMORPHONE HCL 1 MG/ML IJ SOLN: 0.2500 mg | INTRAMUSCULAR | Status: DC | PRN

## 2015-01-01 MED ORDER — OXYCODONE HCL 5 MG/5ML PO SOLN: 5.0000 mg | Freq: Once | ORAL | Status: DC | PRN

## 2015-01-01 SURGICAL SUPPLY — 61 items
BLADE CUTTER GATOR 3.5 (BLADE) ×3 IMPLANT
BLADE GREAT WHITE 4.2 (BLADE) ×2 IMPLANT
BLADE GREAT WHITE 4.2MM (BLADE) ×1
BLADE SURG 11 STRL SS (BLADE) ×3 IMPLANT
BOOTCOVER CLEANROOM LRG (PROTECTIVE WEAR) ×6 IMPLANT
BUR OVAL 4.0 (BURR) ×3 IMPLANT
CANISTER SUCT LVC 12 LTR MEDI- (MISCELLANEOUS) ×3 IMPLANT
CANNULA ACUFLEX KIT 5X76 (CANNULA) ×3 IMPLANT
CANNULA DRILOCK 5.0MMX75MM (CANNULA) ×1
CANNULA DRILOCK 5.0X75 (CANNULA) ×2 IMPLANT
CLOSURE STERI-STRIP 1/2X4 (GAUZE/BANDAGES/DRESSINGS) ×1
CLOSURE WOUND 1/2 X4 (GAUZE/BANDAGES/DRESSINGS) ×1
CLSR STERI-STRIP ANTIMIC 1/2X4 (GAUZE/BANDAGES/DRESSINGS) ×1 IMPLANT
CONNECTOR 5 IN 1 STRAIGHT STRL (MISCELLANEOUS) ×3 IMPLANT
DRAPE INCISE 23X17 IOBAN STRL (DRAPES) ×2
DRAPE INCISE 23X17 STRL (DRAPES) IMPLANT
DRAPE INCISE IOBAN 23X17 STRL (DRAPES) ×1 IMPLANT
DRAPE INCISE IOBAN 66X45 STRL (DRAPES) ×3 IMPLANT
DRAPE ORTHO SPLIT 77X108 STRL (DRAPES) ×6
DRAPE STERI 35X30 U-POUCH (DRAPES) ×2 IMPLANT
DRAPE SURG 17X11 SM STRL (DRAPES) ×3 IMPLANT
DRAPE SURG ORHT 6 SPLT 77X108 (DRAPES) ×2 IMPLANT
DRAPE U-SHAPE 47X51 STRL (DRAPES) IMPLANT
DRSG PAD ABDOMINAL 8X10 ST (GAUZE/BANDAGES/DRESSINGS) ×4 IMPLANT
DURAPREP 26ML APPLICATOR (WOUND CARE) ×3 IMPLANT
GAUZE SPONGE 4X4 12PLY STRL (GAUZE/BANDAGES/DRESSINGS) ×3 IMPLANT
GLOVE BIO SURGEON STRL SZ7.5 (GLOVE) ×3 IMPLANT
GLOVE BIO SURGEON STRL SZ8 (GLOVE) ×3 IMPLANT
GLOVE BIOGEL PI IND STRL 7.0 (GLOVE) IMPLANT
GLOVE BIOGEL PI INDICATOR 7.0 (GLOVE) ×6
GLOVE EUDERMIC 7 POWDERFREE (GLOVE) ×3 IMPLANT
GLOVE SS BIOGEL STRL SZ 7.5 (GLOVE) ×1 IMPLANT
GLOVE SUPERSENSE BIOGEL SZ 7.5 (GLOVE) ×2
GLOVE SURG SS PI 6.5 STRL IVOR (GLOVE) ×2 IMPLANT
GOWN STRL REUS W/ TWL LRG LVL3 (GOWN DISPOSABLE) ×1 IMPLANT
GOWN STRL REUS W/ TWL XL LVL3 (GOWN DISPOSABLE) ×2 IMPLANT
GOWN STRL REUS W/TWL LRG LVL3 (GOWN DISPOSABLE) ×3
GOWN STRL REUS W/TWL XL LVL3 (GOWN DISPOSABLE) ×6
KIT BASIN OR (CUSTOM PROCEDURE TRAY) ×3 IMPLANT
KIT ROOM TURNOVER OR (KITS) ×3 IMPLANT
KIT SHOULDER TRACTION (DRAPES) ×3 IMPLANT
MANIFOLD NEPTUNE II (INSTRUMENTS) ×3 IMPLANT
NDL SPNL 18GX3.5 QUINCKE PK (NEEDLE) ×1 IMPLANT
NEEDLE SPNL 18GX3.5 QUINCKE PK (NEEDLE) ×3 IMPLANT
NS IRRIG 1000ML POUR BTL (IV SOLUTION) ×3 IMPLANT
PACK SHOULDER (CUSTOM PROCEDURE TRAY) ×3 IMPLANT
PAD ARMBOARD 7.5X6 YLW CONV (MISCELLANEOUS) ×6 IMPLANT
SET ARTHROSCOPY TUBING (MISCELLANEOUS) ×3
SET ARTHROSCOPY TUBING LN (MISCELLANEOUS) ×1 IMPLANT
SLING ARM FOAM STRAP LRG (SOFTGOODS) ×2 IMPLANT
SLING ARM LRG ADULT FOAM STRAP (SOFTGOODS) IMPLANT
SPONGE GAUZE 4X4 12PLY STER LF (GAUZE/BANDAGES/DRESSINGS) ×2 IMPLANT
SPONGE LAP 4X18 X RAY DECT (DISPOSABLE) IMPLANT
STRIP CLOSURE SKIN 1/2X4 (GAUZE/BANDAGES/DRESSINGS) ×2 IMPLANT
SUT MNCRL AB 3-0 PS2 18 (SUTURE) ×3 IMPLANT
SUT PDS AB 0 CT 36 (SUTURE) ×2 IMPLANT
TAPE PAPER 3X10 WHT MICROPORE (GAUZE/BANDAGES/DRESSINGS) ×2 IMPLANT
TOWEL OR 17X24 6PK STRL BLUE (TOWEL DISPOSABLE) ×3 IMPLANT
TOWEL OR 17X26 10 PK STRL BLUE (TOWEL DISPOSABLE) ×3 IMPLANT
WAND SUCTION MAX 4MM 90S (SURGICAL WAND) ×3 IMPLANT
WATER STERILE IRR 1000ML POUR (IV SOLUTION) ×3 IMPLANT

## 2015-01-01 NOTE — Addendum Note (Signed)
Addendum  created 01/01/15 1930 by Jillyn Hidden, MD   Modules edited: Notes Section   Notes Section:  File: [id number]

## 2015-01-01 NOTE — Anesthesia Postprocedure Evaluation (Addendum)
  Anesthesia Post-op Note  Patient: Lori Morales  Procedure(s) Performed: Procedure(s) (LRB): RIGHT SHOULDER ARTHROSCOPY WITH SUBACROMIAL DECOMPRESSION AND DISTAL CLAVICLE RESECTION  (Right)  Patient Location: PACU  Anesthesia Type: GA combined with regional for post-op pain  Level of Consciousness: awake and alert   Airway and Oxygen Therapy: Patient Spontanous Breathing  Post-op Pain: mild  Post-op Assessment: Post-op Vital signs reviewed, Patient's Cardiovascular Status Stable, Respiratory Function Stable, Patent Airway and No signs of Nausea or vomiting  Last Vitals:  Filed Vitals:   01/01/15 1530  BP: 115/70  Pulse: 70  Temp: 37.1 C  Resp: 17    Post-op Vital Signs: stable   Complications: Patient with significant post op nausea and vomiting, she has received zofran x2, decadron, reglan, phenergan and benadryl

## 2015-01-01 NOTE — H&P (Signed)
[place]    Chief Complaint: RIGHT SHOULDER IMPINGEMENT  HPI: The patient is a 54 y.o. female with chronic right shoulder pain and impingement refractory to conservative Rx.  Past Medical History  Diagnosis Date  . Headache     migraines  . Depression   . Restless legs syndrome   . IBS (irritable bowel syndrome)   . Palpitations   . GERD (gastroesophageal reflux disease)   . Arthritis     Past Surgical History  Procedure Laterality Date  . Colonoscopy    . Back surgery      1 laminectomy, 1 fusion  . Wisdom tooth extraction    . Abdominoplasty      Family History  Problem Relation Age of Onset  . CAD Mother   . Stroke Mother   . Heart attack Father     Social History:  reports that she has never smoked. She has never used smokeless tobacco. She reports that she does not drink alcohol or use illicit drugs.  Allergies:  Allergies  Allergen Reactions  . Desvenlafaxine Swelling and Other (See Comments)    Headaches (reaction to Pristiq)  . Penicillins Hives and Rash    Medications Prior to Admission  Medication Sig Dispense Refill  . acetaminophen (TYLENOL) 500 MG tablet Take 1,000 mg by mouth every 6 (six) hours as needed for moderate pain.    . celecoxib (CELEBREX) 200 MG capsule Take 200 mg by mouth daily as needed for moderate pain.     Marland Kitchen MIMVEY LO 0.5-0.1 MG per tablet Take 1 tablet by mouth at bedtime.   4  . pantoprazole (PROTONIX) 40 MG tablet Take 40 mg by mouth at bedtime.     . pramipexole (MIRAPEX) 0.25 MG tablet Take 0.25 mg by mouth at bedtime.    Marland Kitchen zolpidem (AMBIEN CR) 6.25 MG CR tablet Take 6.25 mg by mouth at bedtime as needed for sleep.        Physical Exam: right shoulder with pain and restricted motion as noted at recent office visit  Vitals  Temp:  [97 F (36.1 C)] 97 F (36.1 C) (08/04 0810) Pulse Rate:  [39-74] 60 (08/04 0919) Resp:  [14-20] 16 (08/04 0919) BP: (141)/(93) 141/93 mmHg (08/04 0810) SpO2:  [84 %-100 %] 100 % (08/04  0919) Weight:  [72.576 kg (160 lb)] 72.576 kg (160 lb) (08/04 0810)  Assessment/Plan  Impression: RIGHT SHOULDER IMPINGEMENT   Plan of Action: Procedure(s): RIGHT SHOULDER ARTHROSCOPY WITH SUBACROMIAL DECOMPRESSION AND DISTAL CLAVICLE RESECTION   Jurgen Groeneveld M 01/01/2015, 9:31 AM Contact # [phone]

## 2015-01-01 NOTE — Anesthesia Procedure Notes (Addendum)
Procedure Name: Intubation Date/Time: 01/01/2015 10:08 AM Performed by: Izora Gala Pre-anesthesia Checklist: Patient identified, Emergency Drugs available, Suction available and Patient being monitored Patient Re-evaluated:Patient Re-evaluated prior to inductionOxygen Delivery Method: Circle system utilized Preoxygenation: Pre-oxygenation with 100% oxygen Intubation Type: IV induction Laryngoscope Size: Miller and 3 Grade View: Grade I Tube type: Oral Tube size: 7.0 mm Number of attempts: 1 Placement Confirmation: ETT inserted through vocal cords under direct vision,  positive ETCO2 and breath sounds checked- equal and bilateral Secured at: 22 cm Tube secured with: Tape    Anesthesia Regional Block:  Interscalene brachial plexus block  Pre-Anesthetic Checklist: ,, timeout performed, Correct Patient, Correct Site, Correct Laterality, Correct Procedure, Correct Position, site marked, Risks and benefits discussed,  Surgical consent,  Pre-op evaluation,  At surgeon's request and post-op pain management  Laterality: Right  Prep: chloraprep       Needles:   Needle Type: Echogenic Stimulator Needle     Needle Length: 4cm 4 cm Needle Gauge: 21 and 21 G    Additional Needles:  Procedures: ultrasound guided (picture in chart)  Motor weakness within 10 minutes. Interscalene brachial plexus block Narrative:  Injection made incrementally with aspirations every 5 mL.  Performed by: Personally  Anesthesiologist: JUDD, BENJAMIN  Additional Notes:  No pain on injection. No increased resistance to injection.  Meaningful verbal contact maintained throughout block placement.  Motor intact immediately after block. Loss of deltoid function at 20 minutes.     Procedure Name: Intubation Date/Time: 01/01/2015 10:08 AM Performed by: Izora Gala Pre-anesthesia Checklist: Patient identified, Emergency Drugs available, Suction available and Patient being monitored Patient  Re-evaluated:Patient Re-evaluated prior to inductionOxygen Delivery Method: Circle system utilized Preoxygenation: Pre-oxygenation with 100% oxygen Intubation Type: IV induction Ventilation: Mask ventilation without difficulty Laryngoscope Size: Miller and 3 Grade View: Grade I Tube type: Oral Tube size: 7.5 mm Number of attempts: 1 Airway Equipment and Method: Stylet and LTA kit utilized Placement Confirmation: ETT inserted through vocal cords under direct vision,  positive ETCO2 and CO2 detector Secured at: 22 cm Tube secured with: Tape Dental Injury: Teeth and Oropharynx as per pre-operative assessment

## 2015-01-01 NOTE — Op Note (Signed)
NAMERASHEA, HOSKIE NO.:  [id number]  MEDICAL RECORD NO.:  [id number]  LOCATION:  [facility]                         FACILITY:  [facility]  PHYSICIAN:  Metta Clines. Katti Pelle, M.D.  DATE OF BIRTH:  18-Nov-1960  DATE OF PROCEDURE:  01/01/2015 DATE OF DISCHARGE:  01/01/2015                              OPERATIVE REPORT   PREOPERATIVE DIAGNOSES: 1. Chronic right shoulder impingement syndrome. 2. Right shoulder symptomatic AC joint arthropathy.  POSTOPERATIVE DIAGNOSES: 1. Chronic right shoulder impingement syndrome. 2. Right shoulder symptomatic AC joint arthropathy. 3. Partial articular rotator cuff tear.  PROCEDURE: 1. Right shoulder examination under anesthesia. 2. Right shoulder glenohumeral joint diagnostic arthroscopy. 3. Debridement of partial articular rotator cuff tear. 4. Arthroscopic subacromial decompression and bursectomy. 5. Arthroscopic distal clavicle resection.  SURGEON:  Metta Clines. Naylea Wigington, M.D.  Terrence DupontOlivia Mackie A. Shuford, P.A.-C.  ANESTHESIA:  General endotracheal as well as an interscalene block.  ESTIMATED BLOOD LOSS:  Minimal.  DRAINS:  None.  HISTORY:  Ms. Champa is a 54 year old female who has had chronic and progressive increasing right shoulder pain with impingement syndrome and symptomatic AC joint arthropathy which is refractory to prolonged attempts at conservative management.  Due to her ongoing pain and functional limitations and failure to respond to conservative management, she is brought to the operating room at this time for planned right shoulder arthroscopy as described below.  Preoperatively, I counseled Ms. Rondeau regarding treatment options as well as risks versus benefits thereof.  Possible complications were reviewed including bleeding, infection, neurovascular injury, persistent pain, loss of motion, anesthetic  complication, possible need for additional surgery.  She understands and accepts and agrees with our planned  procedure.  PROCEDURE IN DETAIL:  After undergoing routine preop evaluation, the patient received prophylactic antibiotics and an interscalene block was established in the holding area by the Anesthesia Department.  She was placed supine on the operating table, underwent smooth induction of general endotracheal anesthesia.  Turned to left lateral decubitus position on a beanbag and appropriately padded and protected.  Right shoulder examination under anesthesia revealed full motion, and no instability patterns were noted.  Right arm was then suspended at 70 degrees of abduction with 10 pounds of traction, and the right shoulder girdle region was sterilely prepped and draped in standard fashion and time-out was called.  Posterior portal was established in the glenohumeral joint and anterior portal was established under direct visualization.  Articular surfaces were found to be in relatively position although with some very minimal thinning and fibrillation of the articular surface of the inferior and anterior glenoid which certainly did not appear to be a full-thickness defect, but there was some superficial fraying and fibrillation.  Also a small area in the humeral head post superiorly which showed some fibrillation and fraying. The labrum anteroinferiorly showed some mild degenerative fraying as well which we debrided.  The superior labrum showed a stable attachment of the biceps anchor and biceps tendon itself was normal caliber with no obvious distal instability.  The rotator cuff showed articular-sided partial tearing involving the distal supraspinatus which we debrided with a shaver and careful inspected this area. We did pass the tag suture  of 0 PDS for evaluation of this area on the bursal side.  No instability patterns were noted.  Balance of the glenoid joint showed no additional pathologies.  Fluid and instruments were then removed.  Arm was dropped down to 30 degrees of  abduction.  The arthroscope introduced into the subacromial space of the posterior portal and a direct lateral portal established in the subacromial space where abundant dense bursal adhesions were encountered.  These were all divided and excised with combination of shaver and Stryker wand.  The wand was then used to remove the periosteum from the undersurface of the anterior half of the acromion and subacromial space was markedly stenotic with severe bony impingement by the anterior acromion.  A bur was introduced and used to perform subacromial decompression creating a type 1 morphology.  The portal was then established directly anterior to the distal clavicle, and the distal clavicle resection performed with a bur.  Care was taken to confirm visualization of entire circumference of the distal clavicle to ensure adequate removal of bone.  We then completed the subacromial/subdeltoid bursectomy and we carefully inspected and probed the area surrounding the tag suture and did not identify any significant deficiency, defects, or attenuation.  Tag sutures removed, bursectomy was completed, and hemostasis was obtained.  Fluid and instruments were removed.  The portals were closed with Monocryl and Steri-Strips and dry dressing taped about the right shoulder.  Right arm was placed in a sling.  The patient was awakened, extubated, and taken to the recovery room in stable condition.  Jenetta Loges, PA-C was used as an Environmental consultant throughout the case, essential for help with positioning of the patient, positioning of the extremity, management of the arthroscopic equipment, tissue manipulation, suture management, wound closure, and intraoperative decision making.     Metta Clines. Jarious Lyon, M.D.     KMS/MEDQ  D:  01/01/2015  T:  01/01/2015  Job:  [id number]

## 2015-01-01 NOTE — Op Note (Signed)
01/01/2015  11:26 AM  PATIENT:   Lori Morales  54 y.o. female  PRE-OPERATIVE DIAGNOSIS:  RIGHT SHOULDER IMPINGEMENT , AC joint OA  POST-OPERATIVE DIAGNOSIS:  Same with partial articular rotator cuff tear  PROCEDURE:  RSA, RC debridement, SAD, DCR  SURGEON:  Abigaile Rossie, Metta Clines M.D.  ASSISTANTS: Shuford pac   ANESTHESIA:   GET + ISB  EBL: min  SPECIMEN:  none  Drains: none   PATIENT DISPOSITION:  PACU - hemodynamically stable.    PLAN OF CARE: Discharge to home after PACU  Dictation# [id number]   Contact # [phone]

## 2015-01-01 NOTE — Discharge Instructions (Signed)
° °  Lori Morales. Supple, M.D., F.A.A.O.S. Orthopaedic Surgery Specializing in Arthroscopic and Reconstructive Surgery of the Shoulder and Knee [phone] [address] [place], [place] [address] - Fax [phone]   POST-OP SHOULDER ARTHROSCOPY INSTRUCTIONS  1. Call the office at [phone] to schedule your first post-op appointment 7-10 days from the date of your surgery.  2. Leave the steri-strips in place over your incisions when performing dressing changes and showering. You may remove your dressings and begin showering 72 hours from surgery. You can expect drainage that is clear to bloody in nature that occasionally will soak through your dressings. If this occurs go ahead and perform a dressing change. The drainage should lessen daily and when there is no drainage from your incisions feel free to go without a dressing.  3. Wear your sling for comfort. You may come out of your sling for ad lib activity and even decide not to use the sling at all. If you find you are more comfortable in your sling, make sure you come out of your sling at least 3-4 times a day to do the exercises that are included below.  4. Range of motion to your elbow, wrist, and hand are encouraged 3-5 times daily. Exercise to your hand and fingers helps to reduce swelling you may experience.  5. Utilize ice to the shoulder 3-4 times minimum a day and additionally if you are experiencing pain.  6. You may drive when safely off narcotics and muscle relaxants.  7. If you had a block pre-operatively to provide post-op pain relief you may want to go ahead and begin utilizing your pain meds as your arm begins to wake up. Blocks can sometimes last up to 16-18 hours. If you are still pain-free prior to going to bed you may want to strongly consider taking a pain medication to avoid being awakened in the night with the onset of pain. A muscle relaxant is also provided for you should you experience muscle spasms. It  is recommended that if you are experiencing pain that your pain medication alone is not controlling, add the muscle relaxant along with the pain medication which can give additional pain relief. The first one to two days is generally the most severe of your pain and then should gradually decrease. As your pain lessens it is recommended that you decrease your use of the pain medications to an "as needed basis" only and to always comply with the recommended dosages of the pain medications.  8. Pain medications can produce constipation along with their use. If you experience this, the use of an over the counter stool softener or laxative daily is recommended.   9. For additional questions or concerns, please do not hesitate to call the office. If after hours there is an answering service to forward your concerns to the physician on call.   POST-OP EXERCISES  The pendulum exercises should be performed while bending at the waist as far over as possible thereby letting gravity do the work for you.  Range of Motion Exercises: Pendulum (circular)  Repeat 20 times. Do 3 sessions per day.     Range of Motion Exercises: Pendulum (side-to-side)  Repeat 20 times. Do 3 sessions per day.    Range of Motion Exercises (self-stretching activities):  Slide arm up wall with palm toward you, moving closer to the wall. Hold for 5 seconds.  Repeat 10 times. Do 3 sessions per day.

## 2015-01-01 NOTE — Anesthesia Preprocedure Evaluation (Addendum)
Anesthesia Evaluation  Patient identified by MRN, date of birth, ID band Patient awake    Reviewed: Allergy & Precautions, H&P , NPO status , Patient's Chart, lab work & pertinent test results  History of Anesthesia Complications Negative for: history of anesthetic complications  Airway Mallampati: II  TM Distance: >3 FB Neck ROM: full    Dental no notable dental hx.    Pulmonary neg pulmonary ROS,  breath sounds clear to auscultation  Pulmonary exam normal       Cardiovascular negative cardio ROS Normal cardiovascular exam+ dysrhythmias (PVC/PACs occasionally, negative stress test in 06/2014) Rhythm:regular Rate:Normal     Neuro/Psych  Headaches, Depression    GI/Hepatic Neg liver ROS, GERD-  ,  Endo/Other  negative endocrine ROS  Renal/GU negative Renal ROS     Musculoskeletal  (+) Arthritis -,   Abdominal   Peds  Hematology negative hematology ROS (+)   Anesthesia Other Findings   Reproductive/Obstetrics negative OB ROS                            Anesthesia Physical Anesthesia Plan  ASA: II  Anesthesia Plan: General   Post-op Pain Management: GA combined w/ Regional for post-op pain   Induction: Intravenous  Airway Management Planned: Oral ETT  Additional Equipment:   Intra-op Plan:   Post-operative Plan: Extubation in OR  Informed Consent: I have reviewed the patients History and Physical, chart, labs and discussed the procedure including the risks, benefits and alternatives for the proposed anesthesia with the patient or authorized representative who has indicated his/her understanding and acceptance.     Plan Discussed with: Anesthesiologist, CRNA and Surgeon  Anesthesia Plan Comments:         Anesthesia Quick Evaluation

## 2015-01-01 NOTE — Transfer of Care (Signed)
Immediate Anesthesia Transfer of Care Note  Patient: Lori Morales  Procedure(s) Performed: Procedure(s): RIGHT SHOULDER ARTHROSCOPY WITH SUBACROMIAL DECOMPRESSION AND DISTAL CLAVICLE RESECTION  (Right)  Patient Location: PACU  Anesthesia Type:General and Regional  Level of Consciousness: awake, oriented and sedated  Airway & Oxygen Therapy: Patient Spontanous Breathing and Patient connected to nasal cannula oxygen  Post-op Assessment: Report given to RN, Post -op Vital signs reviewed and stable, Patient moving all extremities and Patient moving all extremities X 4  Post vital signs: Reviewed and stable  Last Vitals:  Filed Vitals:   01/01/15 0940  BP: 160/73  Pulse: 71  Temp:   Resp: 11    Complications: No apparent anesthesia complications

## 2015-01-08 ENCOUNTER — Ambulatory Visit: Payer: 59 | Attending: Orthopedic Surgery

## 2015-01-08 ENCOUNTER — Ambulatory Visit: Payer: 59

## 2015-01-08 DIAGNOSIS — M25511 Pain in right shoulder: Secondary | ICD-10-CM | POA: Insufficient documentation

## 2015-01-08 DIAGNOSIS — M542 Cervicalgia: Secondary | ICD-10-CM | POA: Insufficient documentation

## 2015-01-08 DIAGNOSIS — M25611 Stiffness of right shoulder, not elsewhere classified: Secondary | ICD-10-CM

## 2015-01-08 NOTE — Therapy (Signed)
[place] MAIN [facility] SERVICES [address] [place], [place], [address] Phone: [phone]   Fax:  [phone]  Physical Therapy Re- Evaluation  Patient Details  Name: Lori Morales MRN: [id number] Date of Birth: 02-23-1961 Referring Provider:  Justice Britain, MD  Encounter Date: 01/08/2015      PT End of Session - 01/08/15 1756    Visit Number 1   Number of Visits 16   Date for PT Re-Evaluation 02/05/15   PT Start Time 6384   PT Stop Time 1615   PT Time Calculation (min) 60 min   Activity Tolerance Patient tolerated treatment well   Behavior During Therapy [facility] for tasks assessed/performed      Past Medical History  Diagnosis Date  . Headache     migraines  . Depression   . Restless legs syndrome   . IBS (irritable bowel syndrome)   . Palpitations   . GERD (gastroesophageal reflux disease)   . Arthritis   . PONV (postoperative nausea and vomiting)     Past Surgical History  Procedure Laterality Date  . Colonoscopy    . Back surgery      1 laminectomy, 1 fusion  . Wisdom tooth extraction    . Abdominoplasty      There were no vitals filed for this visit.  Visit Diagnosis:  Pain in joint, shoulder region, right - Plan: PT plan of care cert/re-cert  Decreased ROM of right shoulder - Plan: PT plan of care cert/re-cert      Subjective Assessment - 01/08/15 1522    Subjective Pt reports she had right shoulder subacromial decompression and distal clavical exicsion on 01/01/2015.  pt reports she was sleeping in her recliner due to medication.  pt reports she has been icing frequently but has not today.    pt reports she was sick after surgery, pt vomited and required medication to control vomiting.  pt reports she did not feel pain after surgery because of nerve block.   pt reports she was given exerciees (pendulum, wall slides) and performs 2 sets of 20 3x/day.    pt reports she is sore in shoulder with pain about 3/10 and took  half vicodin and vailum before PT.  pt relates for the past two days have only taken meds at night and after exerciseing.     Currently in Pain? Yes   Pain Score 3    Pain Location Shoulder   Pain Orientation Right   Pain Descriptors / Indicators Sore   Pain Type Surgical pain   Pain Onset 1 to 4 weeks ago       UE dermatomes: decreased sensation C4/C5 on R  R shoulder MMT: deferred due to pain  R shoulder AROM: Flexion: 50 Abduction: 36 IR: 45 ER: 25  R shoulder PROM: Flexion: 75 Abduction: 60 ER in scaption: 55 IR: 50 degrees    Visual inspection of incision: healing well with steri strips   therex: Wand exercises (active assisted shoulder ROM):  In supine: flexion and ER x10 In sitting: abduction and ER/IR x10 each Table slides (flexion, abduction) x10 Scapular retractions x10 Pt required verbal cues for correct exercise technique and for slow controlled pace                       PT Education - 01/08/15 1756    Education provided Yes   Education Details plan of care and HEP   Person(s) Educated Patient  Methods Explanation;Demonstration   Comprehension Verbalized understanding;Returned demonstration             PT Long Term Goals - 01/08/15 1801    PT LONG TERM GOAL #1   Title pt will be independent with HEP to reduce pain, improve flexibility and maximize function.    Time 8   Period Weeks   Status New   PT LONG TERM GOAL #2   Title pt will elevate shoulder to 160 deg flexion with pain <3/10 with normal scapular/humeral rhythem.    Time 8   Period Weeks   Status New   PT LONG TERM GOAL #3   Title pt will be able to do her hair with sufficient ER and pain <3/10 in R shoulder   Time 8   Period Weeks   Status New               Plan - 01/08/15 1758    Clinical Impression Statement pt is a 54 year old female s/p right shoulder subacromial decompression and distal clavical exicsion on 01/01/2015. pt presents with impaired  shoulder ROM, pain, decreased strength.  pt will benefit from skilled PT services to decrease pain, improve deficits to return to prior level of function   Pt will benefit from skilled therapeutic intervention in order to improve on the following deficits Hypomobility;Pain;Impaired UE functional use;Decreased strength;Decreased activity tolerance;Decreased range of motion;Impaired flexibility   Rehab Potential Good   PT Frequency 2x / week   PT Duration 8 weeks   PT Treatment/Interventions Aquatic Therapy;ADLs/Self Care Home Management;Cryotherapy;Electrical Stimulation;Iontophoresis 4mg /ml Dexamethasone;Moist Heat;Therapeutic exercise;Therapeutic activities;Ultrasound;Neuromuscular re-education;Manual techniques;Taping;Dry needling;Passive range of motion   PT Next Visit Plan --         Problem List There are no active problems to display for this patient.  Renford Dills, SPT This entire session was performed under direct supervision and direction of a licensed Chiropractor . I have personally read, edited and approve of the note as written. Gorden Harms. Tortorici, PT, DPT [phone]  Tortorici,Ashley 01/08/2015, 6:13 PM  [place] [facility] MAIN [facility] SERVICES [address] [place], [place], [address] Phone: [phone]   Fax:  [phone]

## 2015-01-08 NOTE — Patient Instructions (Signed)
[email or web address] Wand exercises(flexion, abduction, internal rotation/external rotation) 2x10 Table slides (flexoin and abduction) 2x10

## 2015-01-12 ENCOUNTER — Ambulatory Visit: Payer: 59

## 2015-01-12 DIAGNOSIS — M25611 Stiffness of right shoulder, not elsewhere classified: Secondary | ICD-10-CM

## 2015-01-12 DIAGNOSIS — M25511 Pain in right shoulder: Secondary | ICD-10-CM | POA: Diagnosis not present

## 2015-01-12 NOTE — Therapy (Signed)
[place] MAIN [facility] SERVICES [address] [place], [place], [address] Phone: [phone]   Fax:  [phone]  Physical Therapy Treatment  Patient Details  Name: Lori Morales MRN: [id number] Date of Birth: [birth year] Referring Provider:  Justice Britain, MD  Encounter Date: 01/12/2015      PT End of Session - 01/12/15 1206    Visit Number 2   Number of Visits 16   Date for PT Re-Evaluation 02/05/15   PT Start Time 0955   PT Stop Time 1030   PT Time Calculation (min) 35 min   Activity Tolerance Patient tolerated treatment well   Behavior During Therapy [facility] for tasks assessed/performed      Past Medical History  Diagnosis Date  . Headache     migraines  . Depression   . Restless legs syndrome   . IBS (irritable bowel syndrome)   . Palpitations   . GERD (gastroesophageal reflux disease)   . Arthritis   . PONV (postoperative nausea and vomiting)     Past Surgical History  Procedure Laterality Date  . Colonoscopy    . Back surgery      1 laminectomy, 1 fusion  . Wisdom tooth extraction    . Abdominoplasty      There were no vitals filed for this visit.  Visit Diagnosis:  Pain in joint, shoulder region, right  Decreased ROM of right shoulder      Subjective Assessment - 01/12/15 1201    Subjective pt reports she is doing well and is sore from donning clothing this morning and from not icing this morning.  pt relates she is always stiff/sore in the morning but improves throughout the day.  pt relates she has gained some motion since her last session and exercies are getting easier   Currently in Pain? Yes   Pain Score --  right shoulder soreness   Pain Orientation Right   Pain Onset 1 to 4 weeks ago      pt arrived 8 minutes late  Manual Therapy: R shoulder PROM, flexion/abduction/ ER, 2x10 and small oscillations at end range on last set Ischemic compression to mid right trap to decrease tension and pain  Soft  tissue massage to right biceps to decrease tension and pain Pt noted decreased pain after ischemic compression and soft tissue massage  Therex: Active assisted (wand) exercises  In supine: flexion x10 In sitting: abduction and external rotation x 10 each  Right shoulder AROM Flexion: 40 degrees  Abduction 75 degrees  Right shoulder AAROM Flexion: 120 Abduction: 80   .cold pack to R shoulder following therapy for pain relief no charge x 10 min                             PT Long Term Goals - 01/08/15 1801    PT LONG TERM GOAL #1   Title pt will be independent with HEP to reduce pain, improve flexibility and maximize function.    Time 8   Period Weeks   Status New   PT LONG TERM GOAL #2   Title pt will elevate shoulder to 160 deg flexion with pain <3/10 with normal scapular/humeral rhythem.    Time 8   Period Weeks   Status New   PT LONG TERM GOAL #3   Title pt will be able to do her hair with sufficient ER and pain <3/10 in R shoulder   Time  8   Period Weeks   Status New               Plan - 01/12/15 1208    Clinical Impression Statement pt's incisions are healing well and presents with tighness/pain in right biceps and mid right trap which improved with manual therapy.  pt's presents with improved active abduction ROM and flexion/abduction AAROM.  pt experiened minimal increaase in pain with manual treatment.     Pt will benefit from skilled therapeutic intervention in order to improve on the following deficits Hypomobility;Pain;Impaired UE functional use;Decreased strength;Decreased activity tolerance;Decreased range of motion;Impaired flexibility   Rehab Potential Good   PT Frequency 2x / week   PT Duration 8 weeks   PT Treatment/Interventions Aquatic Therapy;ADLs/Self Care Home Management;Cryotherapy;Electrical Stimulation;Iontophoresis 4mg /ml Dexamethasone;Moist Heat;Therapeutic exercise;Therapeutic activities;Ultrasound;Neuromuscular  re-education;Manual techniques;Taping;Dry needling;Passive range of motion   PT Next Visit Plan progress HEP         Problem List There are no active problems to display for this patient.   This entire session was performed under direct supervision and direction of a licensed therapist/therapist assistant . I have personally read, edited and approve of the note as written. Gorden Harms. Tortorici, PT, DPT [phone]  Tortorici,Ashley 01/12/2015, 2:41 PM  [place] MAIN [facility] SERVICES [address] [place], [place], [address] Phone: [phone]   Fax:  [phone]

## 2015-01-14 ENCOUNTER — Ambulatory Visit: Payer: 59

## 2015-01-14 DIAGNOSIS — M25511 Pain in right shoulder: Secondary | ICD-10-CM

## 2015-01-14 DIAGNOSIS — M25611 Stiffness of right shoulder, not elsewhere classified: Secondary | ICD-10-CM

## 2015-01-15 NOTE — Therapy (Signed)
[place] MAIN [facility] SERVICES [address] [place], [place], [address] Phone: [phone]   Fax:  [phone]  Physical Therapy Treatment  Patient Details  Name: Lori Morales MRN: [id number] Date of Birth: [birth year] Referring Provider:  Justice Britain, MD  Encounter Date: 01/14/2015      PT End of Session - 01/15/15 1032    Visit Number 3   Number of Visits 16   Date for PT Re-Evaluation 02/05/15   PT Start Time 2536   PT Stop Time 1555   PT Time Calculation (min) 39 min   Activity Tolerance Patient tolerated treatment well   Behavior During Therapy [facility] for tasks assessed/performed      Past Medical History  Diagnosis Date  . Headache     migraines  . Depression   . Restless legs syndrome   . IBS (irritable bowel syndrome)   . Palpitations   . GERD (gastroesophageal reflux disease)   . Arthritis   . PONV (postoperative nausea and vomiting)     Past Surgical History  Procedure Laterality Date  . Colonoscopy    . Back surgery      1 laminectomy, 1 fusion  . Wisdom tooth extraction    . Abdominoplasty      There were no vitals filed for this visit.  Visit Diagnosis:  Pain in joint, shoulder region, right  Decreased ROM of right shoulder      Subjective Assessment - 01/15/15 0953    Subjective pt reports she went to follow up with the surgeon. he reports everything looks good. one of her anterior incisions is a little puffy and red and should be monitored. pt reports she has been compliant to HEP and pain stays low except when she is doing a lot.    Patient Stated Goals reduce pain before surgery   Currently in Pain? Yes   Pain Score 3    Pain Location --  R shoulder   Pain Onset 1 to 4 weeks ago      Manual Therapy: GH mobilizations grade II-III inferior/posterior glides 3 bouts of 30s  AC and [place] joint mobs inferior and superior grade II-III 3 bouts of 15s R shoulder PROM, flexion/abduction/ ER, x15 and small  oscillations at end range on last set Soft tissue massage to right deltoid to decrease tension and pain   Therex: Inclined ball roll up mat table - flexion x 10 Bent over row and shoulder extension 2x10 Shoulder isometrics: adduction, ER/IR 5s x 10 each Pt reports no increased pain with therex. Min cues for proper performance.                             PT Education - 01/15/15 1031    Education provided Yes   Education Details cryotherapy   Person(s) Educated Patient   Methods Explanation   Comprehension Verbalized understanding             PT Long Term Goals - 01/08/15 1801    PT LONG TERM GOAL #1   Title pt will be independent with HEP to reduce pain, improve flexibility and maximize function.    Time 8   Period Weeks   Status New   PT LONG TERM GOAL #2   Title pt will elevate shoulder to 160 deg flexion with pain <3/10 with normal scapular/humeral rhythem.    Time 8   Period Weeks   Status New  PT LONG TERM GOAL #3   Title pt will be able to do her hair with sufficient ER and pain <3/10 in R shoulder   Time 8   Period Weeks   Status New               Plan - 01/15/15 1044    Clinical Impression Statement pt is progressing well in terms of ROM, pain and therex. initially she has pain with passive shoulder flexion/abduction but improves with manual therapy. pts ER ROM is nearly WNL. pt did well with progeression of scapular muscle strengthening today.    Pt will benefit from skilled therapeutic intervention in order to improve on the following deficits Hypomobility;Pain;Impaired UE functional use;Decreased strength;Decreased activity tolerance;Decreased range of motion;Impaired flexibility   Rehab Potential Good   PT Frequency 2x / week   PT Duration 8 weeks   PT Treatment/Interventions Aquatic Therapy;ADLs/Self Care Home Management;Cryotherapy;Electrical Stimulation;Iontophoresis 4mg /ml Dexamethasone;Moist Heat;Therapeutic  exercise;Therapeutic activities;Ultrasound;Neuromuscular re-education;Manual techniques;Taping;Dry needling;Passive range of motion   PT Next Visit Plan progress HEP         Problem List There are no active problems to display for this patient.  Gorden Harms. Paulena Servais, PT, DPT [phone]  Adia Crammer 01/15/2015, 10:46 AM  [place] MAIN [facility] SERVICES [address] [place], [place], [address] Phone: [phone]   Fax:  [phone]

## 2015-01-20 ENCOUNTER — Ambulatory Visit: Payer: 59

## 2015-01-22 ENCOUNTER — Ambulatory Visit: Payer: 59

## 2015-01-22 DIAGNOSIS — M25611 Stiffness of right shoulder, not elsewhere classified: Secondary | ICD-10-CM

## 2015-01-22 DIAGNOSIS — M25511 Pain in right shoulder: Secondary | ICD-10-CM | POA: Diagnosis not present

## 2015-01-22 DIAGNOSIS — M542 Cervicalgia: Secondary | ICD-10-CM

## 2015-01-22 NOTE — Therapy (Signed)
[place] MAIN [facility] SERVICES [address] [place], [place], [address] Phone: [phone]   Fax:  [phone]  Physical Therapy Treatment  Patient Details  Name: Lori Morales MRN: [id number] Date of Birth: [birth year] Referring Provider:  Justice Britain, MD  Encounter Date: 01/22/2015      PT End of Session - 01/22/15 1351    Visit Number 4   Number of Visits 16   Date for PT Re-Evaluation 02/05/15   PT Start Time 1009   PT Stop Time 1051   PT Time Calculation (min) 42 min   Activity Tolerance Patient tolerated treatment well   Behavior During Therapy [facility] for tasks assessed/performed      Past Medical History  Diagnosis Date  . Headache     migraines  . Depression   . Restless legs syndrome   . IBS (irritable bowel syndrome)   . Palpitations   . GERD (gastroesophageal reflux disease)   . Arthritis   . PONV (postoperative nausea and vomiting)     Past Surgical History  Procedure Laterality Date  . Colonoscopy    . Back surgery      1 laminectomy, 1 fusion  . Wisdom tooth extraction    . Abdominoplasty      There were no vitals filed for this visit.  Visit Diagnosis:  Pain in joint, shoulder region, right  Decreased ROM of right shoulder  Cervicalgia      Subjective Assessment - 01/22/15 1346    Subjective pt reports overall she has been doing well. however 2 days ago she was washing her bathroom when suddenly her R arm felt like she had a big pop and a gread deal of pain in the superior shoulder along the upper trap. pt is nervous that she has injured her shoulder. she reports having increased pain since then as well.    Patient Stated Goals reduce pain before surgery   Currently in Pain? Yes   Pain Score 6    Pain Location --  top of R shoulder and deltoid   Pain Onset 1 to 4 weeks ago     shoulder screen: (-) drop arm test, (-) dropping sign, (-) belly press test, (-) hornblowers sign. (-) for increased  shoulder shrug or loss of ROM  Manual therapy: Soft tissue massage and ischemic trigger point release to R upper trap, levator scapulae, borders of subscapularis (prone and supine), biceps, deltoid (r) PA and unilateral glide to T spine 2 bouts of 30 s each . Pt is particularly tender on T2-4 grade 3  Pt reported great reduction in pain and improved mobility with less pain following therapy.  Brief instruction of levator scap stretching.                              PT Education - 01/22/15 1351    Education provided Yes   Education Details upper trap and levator stretching   Person(s) Educated Patient   Methods Explanation   Comprehension Returned demonstration;Verbalized understanding             PT Long Term Goals - 01/08/15 1801    PT LONG TERM GOAL #1   Title pt will be independent with HEP to reduce pain, improve flexibility and maximize function.    Time 8   Period Weeks   Status New   PT LONG TERM GOAL #2   Title pt will elevate shoulder  to 160 deg flexion with pain <3/10 with normal scapular/humeral rhythem.    Time 8   Period Weeks   Status New   PT LONG TERM GOAL #3   Title pt will be able to do her hair with sufficient ER and pain <3/10 in R shoulder   Time 8   Period Weeks   Status New               Plan - 01/22/15 1352    Clinical Impression Statement PT performed quick shoulder screen, particular for RTC injury. pt does not appear to have injured her RTC, but does have pain with shoulder IR vs ER today. pt has increased tenderness and triggerpoints to the R upper trap,biceps, R levator scapulae and subscapularis. pt responded very well to manual therapy today reporting a dramatic reduction in pain both at rest and with movement. treatment today was limited to manual therapy today due to her increased pain. plan to return to therex/manual next visit as tolerated.    Pt will benefit from skilled therapeutic intervention in order  to improve on the following deficits Hypomobility;Pain;Impaired UE functional use;Decreased strength;Decreased activity tolerance;Decreased range of motion;Impaired flexibility   Rehab Potential Good   PT Frequency 2x / week   PT Duration 8 weeks   PT Treatment/Interventions Aquatic Therapy;ADLs/Self Care Home Management;Cryotherapy;Electrical Stimulation;Iontophoresis 4mg /ml Dexamethasone;Moist Heat;Therapeutic exercise;Therapeutic activities;Ultrasound;Neuromuscular re-education;Manual techniques;Taping;Dry needling;Passive range of motion   PT Next Visit Plan progress HEP         Problem List There are no active problems to display for this patient.  Gorden Harms. Marlyn Rabine, PT, DPT [phone]  Ophia Shamoon 01/22/2015, 1:55 PM  [place] [facility] MAIN [facility] SERVICES [address] [place], [place], [address] Phone: [phone]   Fax:  [phone]

## 2015-01-26 DIAGNOSIS — E785 Hyperlipidemia, unspecified: Secondary | ICD-10-CM | POA: Insufficient documentation

## 2015-01-27 ENCOUNTER — Ambulatory Visit: Payer: 59

## 2015-01-27 DIAGNOSIS — M25511 Pain in right shoulder: Secondary | ICD-10-CM | POA: Diagnosis not present

## 2015-01-27 DIAGNOSIS — M25611 Stiffness of right shoulder, not elsewhere classified: Secondary | ICD-10-CM

## 2015-01-27 NOTE — Therapy (Signed)
[place] MAIN [facility] SERVICES [address] [place], [place], [address] Phone: [phone]   Fax:  [phone]  Physical Therapy Treatment  Patient Details  Name: Lori Morales MRN: [id number] Date of Birth: 24-Apr-1961 Referring Provider:  Justice Britain, MD  Encounter Date: 01/27/2015      PT End of Session - 01/27/15 1226    Visit Number 5   Number of Visits 16   Date for PT Re-Evaluation 02/05/15   PT Start Time 1324   PT Stop Time 1120   PT Time Calculation (min) 45 min   Activity Tolerance Patient tolerated treatment well   Behavior During Therapy [facility] for tasks assessed/performed      Past Medical History  Diagnosis Date  . Headache     migraines  . Depression   . Restless legs syndrome   . IBS (irritable bowel syndrome)   . Palpitations   . GERD (gastroesophageal reflux disease)   . Arthritis   . PONV (postoperative nausea and vomiting)     Past Surgical History  Procedure Laterality Date  . Colonoscopy    . Back surgery      1 laminectomy, 1 fusion  . Wisdom tooth extraction    . Abdominoplasty      There were no vitals filed for this visit.  Visit Diagnosis:  Pain in joint, shoulder region, right  Decreased ROM of right shoulder      Subjective Assessment - 01/27/15 1218    Subjective Pt states that she is doing well today. She denies pain at rest and reports pain only at end range. Pt states that the R shoulder pain she was experiencing resolved with rest, ice, and muscle relaxers. Pt is performing HEP and is eager to be able to improve her shoulder strength. No specific questions or concerns at this time.    Pain Onset 1 to 4 weeks ago        OBJECTIVE: Manual therapy: Gentle R shoulder distraction while progressing through PROM abduction; PROM R shoulder flexion, abduction, ER, and IR, pain-free; STM to R anterior and lateral deltoid, long head bicep tendon, and R upper trap; R shoulder AP  mobilizations grade II, 30 second/bout x 3 R shoulder medial to lateral mobilizations grade II, 30 second/bout x 3;  There-ex: AROM R shoulder flexion to end-range;  L sidelying R scapular retractions x 10 with manual resistance; L sidelying R shoulder ER against gravity with wash cloth under elbow x 10;                       PT Education - 01/27/15 1221    Education provided Yes   Education Details HEP progression; L sidelying R shoulder retraction, L sidelying R shoulder ER against gravity (pain-free)   Person(s) Educated Patient   Methods Explanation;Demonstration   Comprehension Verbalized understanding;Returned demonstration             PT Long Term Goals - 01/08/15 1801    PT LONG TERM GOAL #1   Title pt will be independent with HEP to reduce pain, improve flexibility and maximize function.    Time 8   Period Weeks   Status New   PT LONG TERM GOAL #2   Title pt will elevate shoulder to 160 deg flexion with pain <3/10 with normal scapular/humeral rhythem.    Time 8   Period Weeks   Status New   PT LONG TERM GOAL #3   Title  pt will be able to do her hair with sufficient ER and pain <3/10 in R shoulder   Time 8   Period Weeks   Status New               Plan - 01/27/15 1243    Clinical Impression Statement Palpable muscle spasms with R shoulder PROM into flexion and abduction. Most significant direction of impairment for R shoulder is abduction as she reports pain as she approaches 90 degrees. Pt with considerable improvement in R shoulder PROM external rotation at end of session. Pt encouraged to initiate sidelying scapular retraction and ER (against gravity) for periscapular and shoulder strengthening. Encouraged pt to continue HEP and follow-up as scheduled.   Pt will benefit from skilled therapeutic intervention in order to improve on the following deficits Hypomobility;Pain;Impaired UE functional use;Decreased strength;Decreased activity  tolerance;Decreased range of motion;Impaired flexibility   Rehab Potential Good   PT Frequency 2x / week   PT Duration 8 weeks   PT Treatment/Interventions Aquatic Therapy;ADLs/Self Care Home Management;Cryotherapy;Electrical Stimulation;Iontophoresis 4mg /ml Dexamethasone;Moist Heat;Therapeutic exercise;Therapeutic activities;Ultrasound;Neuromuscular re-education;Manual techniques;Taping;Dry needling;Passive range of motion   PT Next Visit Plan Progress R shoulder strengthening and pain-free AAROM   Consulted and Agree with Plan of Care Patient        Problem List There are no active problems to display for this patient.  Phillips Grout PT, DPT   Lori Morales 01/27/2015, 2:47 PM  [place] MAIN [facility] SERVICES [address] [place], [place], [address] Phone: [phone]   Fax:  [phone]

## 2015-01-29 ENCOUNTER — Ambulatory Visit: Payer: 59 | Attending: Surgery

## 2015-01-29 DIAGNOSIS — M25511 Pain in right shoulder: Secondary | ICD-10-CM | POA: Diagnosis not present

## 2015-01-29 DIAGNOSIS — M25611 Stiffness of right shoulder, not elsewhere classified: Secondary | ICD-10-CM

## 2015-01-29 NOTE — Therapy (Signed)
[place] MAIN [facility] SERVICES [address] [place], [place], [address] Phone: [phone]   Fax:  [phone]  Physical Therapy Treatment  Patient Details  Name: Lori Morales MRN: [id number] Date of Birth: [birth year] Referring Provider:  Justice Britain, MD  Encounter Date: 01/29/2015      PT End of Session - 01/29/15 1149    Visit Number 6   Number of Visits 16   Date for PT Re-Evaluation 02/05/15   PT Start Time 7253   PT Stop Time 1125   PT Time Calculation (min) 50 min   Activity Tolerance Patient tolerated treatment well   Behavior During Therapy [facility] for tasks assessed/performed      Past Medical History  Diagnosis Date  . Headache     migraines  . Depression   . Restless legs syndrome   . IBS (irritable bowel syndrome)   . Palpitations   . GERD (gastroesophageal reflux disease)   . Arthritis   . PONV (postoperative nausea and vomiting)     Past Surgical History  Procedure Laterality Date  . Colonoscopy    . Back surgery      1 laminectomy, 1 fusion  . Wisdom tooth extraction    . Abdominoplasty      There were no vitals filed for this visit.  Visit Diagnosis:  Pain in joint, shoulder region, right  Decreased ROM of right shoulder      Subjective Assessment - 01/29/15 1147    Subjective Pt reports she is doing well today. She reports some upper back soreness near the medial border of her sacpula as well as her upper trap area. Pt states she had increased soreness yesterday. She performed her new HEP (scpaular retraction and ER) without soreness or pain.         OBJECTIVE: Manual therapy: Gentle R shoulder distraction while progressing through PROM abduction; PROM R shoulder flexion, abduction, ER, and IR, pain-free; T2-T8 CPA grade III, 20 sec/bout x 1 bout/level; R UPA T2-T8, 20 sec/bout x 1 bout/level; Instruction in seated chair extensions as well as foam rolling vs tennis balls for thoracic extension  in standing; STM to R upper trap, paraspinals, levator, and rhomboids with focus on medial border and superior angle of scapula; pt reports considerable improvement in muscle guarding/pain after STM; Biofreeze applied to area at end of session;  There-ex: L sidelying R scapular retractions 2 x 10 with manual resistance; L sidelying R shoulder ER against gravity with wash cloth under elbow 2 x 10;                         PT Education - 01/29/15 1149    Education provided Yes   Education Details Posture education, thoracic foam rolling vs seated chair extensions   Person(s) Educated Patient   Methods Explanation;Demonstration   Comprehension Verbalized understanding;Returned demonstration             PT Long Term Goals - 01/08/15 1801    PT LONG TERM GOAL #1   Title pt will be independent with HEP to reduce pain, improve flexibility and maximize function.    Time 8   Period Weeks   Status New   PT LONG TERM GOAL #2   Title pt will elevate shoulder to 160 deg flexion with pain <3/10 with normal scapular/humeral rhythem.    Time 8   Period Weeks   Status New   PT LONG TERM GOAL #3  Title pt will be able to do her hair with sufficient ER and pain <3/10 in R shoulder   Time 8   Period Weeks   Status New               Plan - 01/29/15 1149    Clinical Impression Statement Notable improvement in pain free R shoulder flexion following scapular and thoracic mobilizations. Pt reports decrease in muscle spasms following soft tissue mobilization. Pt encouraged to continue HEP and follow-up as scheduled.    Pt will benefit from skilled therapeutic intervention in order to improve on the following deficits Hypomobility;Pain;Impaired UE functional use;Decreased strength;Decreased activity tolerance;Decreased range of motion;Impaired flexibility   Rehab Potential Good   PT Frequency 2x / week   PT Duration 8 weeks   PT Treatment/Interventions Aquatic  Therapy;ADLs/Self Care Home Management;Cryotherapy;Electrical Stimulation;Iontophoresis 4mg /ml Dexamethasone;Moist Heat;Therapeutic exercise;Therapeutic activities;Ultrasound;Neuromuscular re-education;Manual techniques;Taping;Dry needling;Passive range of motion   PT Next Visit Plan Progress R shoulder strengthening and pain-free AAROM, thoracic and scapular mobilizations, soft tissue release upper trap, levator, rhomboids   Consulted and Agree with Plan of Care Patient        Problem List There are no active problems to display for this patient.  Phillips Grout PT, DPT   Batul Diego 01/29/2015, 11:54 AM  [place] MAIN [facility] SERVICES [address] [place], [place], [address] Phone: [phone]   Fax:  [phone]

## 2015-02-04 ENCOUNTER — Ambulatory Visit: Payer: 59

## 2015-02-04 DIAGNOSIS — M25611 Stiffness of right shoulder, not elsewhere classified: Secondary | ICD-10-CM

## 2015-02-04 DIAGNOSIS — M25511 Pain in right shoulder: Secondary | ICD-10-CM | POA: Diagnosis not present

## 2015-02-04 NOTE — Therapy (Addendum)
[place] MAIN [facility] SERVICES [address] [place], [place], [address] Phone: [phone]   Fax:  [phone]  Physical Therapy Treatment /Physical Therapy Progress Note 01/07/2015  to 02/04/15  Patient Details  Name: GLENDON FISER MRN: [id number] Date of Birth: Sep 14, 1960 Referring Provider:  Justice Britain, MD  Encounter Date: 02/04/2015      PT End of Session - 02/04/15 1603    Visit Number 9   Number of Visits 24   Date for PT Re-Evaluation 03/04/15   PT Start Time 0930   PT Stop Time 3817   PT Time Calculation (min) 45 min   Activity Tolerance Patient tolerated treatment well   Behavior During Therapy [facility] for tasks assessed/performed      Past Medical History  Diagnosis Date  . Headache     migraines  . Depression   . Restless legs syndrome   . IBS (irritable bowel syndrome)   . Palpitations   . GERD (gastroesophageal reflux disease)   . Arthritis   . PONV (postoperative nausea and vomiting)     Past Surgical History  Procedure Laterality Date  . Colonoscopy    . Back surgery      1 laminectomy, 1 fusion  . Wisdom tooth extraction    . Abdominoplasty      There were no vitals filed for this visit.  Visit Diagnosis:  Pain in joint, shoulder region, right - Plan: PT plan of care cert/re-cert  Decreased ROM of right shoulder - Plan: PT plan of care cert/re-cert      Subjective Assessment - 02/04/15 1602    Subjective pt reports she has had some spasms (points to R upper trap) when trying to reach for items and such. reprots they are very painful and limit her movement for a few hours.    Patient Stated Goals reduce pain before surgery   Currently in Pain? Yes   Pain Score 4    Pain Location --  upper trap   Pain Orientation Right   Pain Descriptors / Indicators Aching   Pain Onset 1 to 4 weeks ago     AROM RUE: Flexion 130 deg Abduction 110 deg ER 65 deg  IR to L4     OBJECTIVE: Manual  therapy: ; T2-T8 CPA grade III, 20 sec/bout x 2 bout/level; R UPA T2-T8, 20 sec/bout x 1 bout/level; Soft tissue massage and AISTM (using edge tool) to R upper trap, levator, and rhomboids with focus on upper trap; pt reports considerable improvement in muscle guarding/pain after STM; ischemic trigger point release to levator and upper trap Patient received dry needling therapy education and acknowledged understanding of risks and benefits of dry needling therapy prior to receiving treatment. Patient voiced understanding of treatment options and elected to proceed with dry needling therapy.   Trigger point dry needling to R upper trap - twitch response noted                            PT Education - 02/04/15 1603    Education provided Yes   Education Details review trap stretch   Person(s) Educated Patient   Methods Explanation   Comprehension Verbalized understanding             PT Long Term Goals - 02/04/15 1604    PT LONG TERM GOAL #1   Title pt will be independent with HEP to reduce pain, improve flexibility and maximize function.  Time 8   Period Weeks   Status Partially Met   PT LONG TERM GOAL #2   Title pt will elevate shoulder to 160 deg flexion with pain <3/10 with normal scapular/humeral rhythem.    Time 8   Period Weeks   Status Partially Met   PT LONG TERM GOAL #3   Title pt will be able to do her hair with sufficient ER and pain <3/10 in R shoulder   Time 8   Period Weeks   Status Partially Met   PT LONG TERM GOAL #4   Title pt will be able to lift 5lb item into over head cabinet x 5 without pain to return to regular ADLs   Time 4   Period Weeks   Status New               Plan - 02/04/15 1603    Clinical Impression Statement pt responded well to manual therapy today and was able to demonstrate improved shoulder AROM. pt has partially met PT goals and is doing well other than her upper trap/levator spasms. pt would benefit  from continued skilled PT services to further address pain, ROM, strength to return to PLOF.    Pt will benefit from skilled therapeutic intervention in order to improve on the following deficits Hypomobility;Pain;Impaired UE functional use;Decreased strength;Decreased activity tolerance;Decreased range of motion;Impaired flexibility   Rehab Potential Good   PT Frequency 2x / week   PT Duration 8 weeks   PT Treatment/Interventions Aquatic Therapy;ADLs/Self Care Home Management;Cryotherapy;Electrical Stimulation;Iontophoresis 83m/ml Dexamethasone;Moist Heat;Therapeutic exercise;Therapeutic activities;Ultrasound;Neuromuscular re-education;Manual techniques;Taping;Dry needling;Passive range of motion   PT Next Visit Plan Progress R shoulder strengthening and pain-free AAROM, thoracic and scapular mobilizations, soft tissue release upper trap, levator, rhomboids   Consulted and Agree with Plan of Care Patient        Problem List There are no active problems to display for this patient.  AGorden Harms Letoya Stallone, PT, DPT #[id number] Wynona Duhamel 02/04/2015, 4:06 PM  [place] [facility] [address] [place] [address]Phone: [phone]  Fax:  [phone]

## 2015-02-10 ENCOUNTER — Ambulatory Visit: Payer: 59

## 2015-02-10 DIAGNOSIS — M25511 Pain in right shoulder: Secondary | ICD-10-CM

## 2015-02-10 DIAGNOSIS — M25611 Stiffness of right shoulder, not elsewhere classified: Secondary | ICD-10-CM

## 2015-02-10 NOTE — Therapy (Signed)
[place] MAIN [facility] SERVICES [address] [place], [place], [address] Phone: [phone]   Fax:  [phone]  Physical Therapy Treatment  Patient Details  Name: Lori Morales MRN: [id number] Date of Birth: [birth year] Referring Provider:  Justice Britain, MD  Encounter Date: 02/10/2015      PT End of Session - 02/10/15 1701    Visit Number 10   Number of Visits 24   Date for PT Re-Evaluation 03/04/15   PT Start Time 0930   PT Stop Time 1020   PT Time Calculation (min) 50 min   Activity Tolerance Patient tolerated treatment well   Behavior During Therapy [facility] for tasks assessed/performed      Past Medical History  Diagnosis Date  . Headache     migraines  . Depression   . Restless legs syndrome   . IBS (irritable bowel syndrome)   . Palpitations   . GERD (gastroesophageal reflux disease)   . Arthritis   . PONV (postoperative nausea and vomiting)     Past Surgical History  Procedure Laterality Date  . Colonoscopy    . Back surgery      1 laminectomy, 1 fusion  . Wisdom tooth extraction    . Abdominoplasty      There were no vitals filed for this visit.  Visit Diagnosis:  Pain in joint, shoulder region, right  Decreased ROM of right shoulder      Subjective Assessment - 02/10/15 1659    Subjective pt reports she typically feels better following therapy, but has spasms that seem to set her back pain-wise. pt reports she had follow up with PA who reports she may have injured her RTC, but most likely not.    Patient Stated Goals reduce pain before surgery   Currently in Pain? Yes   Pain Score 4    Pain Location --  R shoulder   Pain Onset 1 to 4 weeks ago       manual therapy: Extensive Soft tissue massage to R upper trap, supra spinatus, infraspinatus and teres major/minor muscle belly. Included efflurage, muscle stripping, ischemic TP release and contract relax Grade III-IIV thoracic CPA and rotational  mobilizations 2x20s each level.  Posterior capsule stretch with contract relax  AP, PA and inferior GH glides grade III 2x30s each  PROM to shoulder into flexion/ scaption ER/IR with gentle over pressure and small end range oscillations  Trigger points located throughout periscapular area. Trigger point in the teres minor refers to the wrist per pt and gives feeling of weakness in the hand.                                PT Long Term Goals - 02/04/15 1604    PT LONG TERM GOAL #1   Title pt will be independent with HEP to reduce pain, improve flexibility and maximize function.    Time 8   Period Weeks   Status Partially Met   PT LONG TERM GOAL #2   Title pt will elevate shoulder to 160 deg flexion with pain <3/10 with normal scapular/humeral rhythem.    Time 8   Period Weeks   Status Partially Met   PT LONG TERM GOAL #3   Title pt will be able to do her hair with sufficient ER and pain <3/10 in R shoulder   Time 8   Period Weeks   Status Partially Met  PT LONG TERM GOAL #4   Title pt will be able to lift 5lb item into over head cabinet x 5 without pain to return to regular ADLs   Time 4   Period Weeks   Status New               Plan - 02/10/15 1701    Clinical Impression Statement pt again responds well to soft tissue massage, and maunal therapy with less pain during her ROM. she has nearly full passive ROM, but is painful with active ROM. pt pain/tenderness with soft tissue is primarily located to infraspinatus and teres minor / major   Pt will benefit from skilled therapeutic intervention in order to improve on the following deficits Hypomobility;Pain;Impaired UE functional use;Decreased strength;Decreased activity tolerance;Decreased range of motion;Impaired flexibility   Rehab Potential Good   PT Frequency 2x / week   PT Duration 8 weeks   PT Treatment/Interventions Aquatic Therapy;ADLs/Self Care Home Management;Cryotherapy;Electrical  Stimulation;Iontophoresis 56m/ml Dexamethasone;Moist Heat;Therapeutic exercise;Therapeutic activities;Ultrasound;Neuromuscular re-education;Manual techniques;Taping;Dry needling;Passive range of motion   PT Next Visit Plan Progress R shoulder strengthening and pain-free AAROM, thoracic and scapular mobilizations, soft tissue release upper trap, levator, rhomboids   Consulted and Agree with Plan of Care Patient        Problem List There are no active problems to display for this patient.  AGorden Harms [name], PT, DPT #[phone] [name] 02/10/2015, 5:03 PM  [place] [facility]MAIN [facility]Field [facility] HospitalSERVICES [address] [address]Phone: [phone]  Fax:  [phone]

## 2015-02-12 ENCOUNTER — Ambulatory Visit: Payer: 59

## 2015-02-12 DIAGNOSIS — M25511 Pain in right shoulder: Secondary | ICD-10-CM | POA: Diagnosis not present

## 2015-02-12 DIAGNOSIS — M25611 Stiffness of right shoulder, not elsewhere classified: Secondary | ICD-10-CM

## 2015-02-12 NOTE — Therapy (Signed)
[place] MAIN [facility] SERVICES [address] [place], [place], [address] Phone: [phone]   Fax:  [phone]  Physical Therapy Treatment  Patient Details  Name: Lori Morales MRN: [id number] Date of Birth: [birth year] Referring Provider:  Justice Britain, MD  Encounter Date: 02/12/2015      PT End of Session - 02/12/15 1237    Visit Number 11   Number of Visits 24   Date for PT Re-Evaluation 03/04/15   PT Start Time 0930   PT Stop Time 3354   PT Time Calculation (min) 44 min   Activity Tolerance Patient tolerated treatment well   Behavior During Therapy [facility] for tasks assessed/performed      Past Medical History  Diagnosis Date  . Headache     migraines  . Depression   . Restless legs syndrome   . IBS (irritable bowel syndrome)   . Palpitations   . GERD (gastroesophageal reflux disease)   . Arthritis   . PONV (postoperative nausea and vomiting)     Past Surgical History  Procedure Laterality Date  . Colonoscopy    . Back surgery      1 laminectomy, 1 fusion  . Wisdom tooth extraction    . Abdominoplasty      There were no vitals filed for this visit.  Visit Diagnosis:  Pain in joint, shoulder region, right  Decreased ROM of right shoulder      Subjective Assessment - 02/12/15 1231    Subjective pt reports she is having less pain and tightness since tuesday.    Patient Stated Goals reduce pain before surgery   Currently in Pain? Yes   Pain Score 2    Pain Orientation --  R shoulder and upper trap   Pain Onset 1 to 4 weeks ago        Therex: UE ranger into full RUE flexion x 10, then ABCs A-Z min cues for shoulder depression Shoulder protraction on wall 2x10 D2 flexion 0lbs 2x5 D1 shoulder extension : red band 2x10 Shoulder adduction: red band 2x10 Triceps ext red band 2x10 Bicep curl red band 2x10 Doorway and posterior capsule stretch 30s x 2  Pt needs min cues for scapular positioning Manual  therapy: [place] joint mobs grade 3 posterior inferior and anterior superior 2x20 AC joint mobs inferior and superior grade 3 2x20 Inferior and posterior GH glides grade 3-4 2x30s  Shoulder dystraction grade 2-3 2x15  Pt demonstrates full shoulder flexion AROM in sitting following therapy without pain                                PT Long Term Goals - 02/04/15 1604    PT LONG TERM GOAL #1   Title pt will be independent with HEP to reduce pain, improve flexibility and maximize function.    Time 8   Period Weeks   Status Partially Met   PT LONG TERM GOAL #2   Title pt will elevate shoulder to 160 deg flexion with pain <3/10 with normal scapular/humeral rhythem.    Time 8   Period Weeks   Status Partially Met   PT LONG TERM GOAL #3   Title pt will be able to do her hair with sufficient ER and pain <3/10 in R shoulder   Time 8   Period Weeks   Status Partially Met   PT LONG TERM GOAL #4   Title pt  will be able to lift 5lb item into over head cabinet x 5 without pain to return to regular ADLs   Time 4   Period Weeks   Status New               Plan - 02/12/15 1239    Clinical Impression Statement (p) pt did well with progression of therex today. pt actually reported reduction of pain following exercises. pt also responded well to [facility] and AC joint mobs. pt would benefit from continued skilled PT services to further improve function, strength, ROM to return to PLOF.    Pt will benefit from skilled therapeutic intervention in order to improve on the following deficits (p) Hypomobility;Pain;Impaired UE functional use;Decreased strength;Decreased activity tolerance;Decreased range of motion;Impaired flexibility   Rehab Potential (p) Good   PT Frequency (p) 2x / week   PT Duration (p) 8 weeks   PT Treatment/Interventions (p) Aquatic Therapy;ADLs/Self Care Home Management;Cryotherapy;Electrical Stimulation;Iontophoresis 81m/ml Dexamethasone;Moist Heat;Therapeutic  exercise;Therapeutic activities;Ultrasound;Neuromuscular re-education;Manual techniques;Taping;Dry needling;Passive range of motion   Consulted and Agree with Plan of Care (p) Patient        Problem List There are no active problems to display for this patient.  AGorden Harms Janee Ureste, PT, DPT #[phone] Yoshio Seliga 02/12/2015, 1:01 PM  CToomsubaMAIN RMurrells Inlet [facility] D[facility] Surgery CenterSERVICES [address] [address]Phone: [phone]  Fax:  [phone]

## 2015-02-17 ENCOUNTER — Ambulatory Visit: Payer: 59

## 2015-02-17 DIAGNOSIS — M25611 Stiffness of right shoulder, not elsewhere classified: Secondary | ICD-10-CM

## 2015-02-17 DIAGNOSIS — M25511 Pain in right shoulder: Secondary | ICD-10-CM

## 2015-02-17 NOTE — Therapy (Signed)
[place] MAIN [facility] SERVICES [address] [place], [place], [address] Phone: [phone]   Fax:  [phone]  Physical Therapy Treatment  Patient Details  Name: Lori Morales MRN: [id number] Date of Birth: [birth year] Referring Provider:  Justice Britain, MD  Encounter Date: 02/17/2015      PT End of Session - 02/17/15 1703    Visit Number 12   Number of Visits 24   Date for PT Re-Evaluation 03/04/15   PT Start Time 0935   PT Stop Time 1020   PT Time Calculation (min) 45 min   Activity Tolerance Patient tolerated treatment well   Behavior During Therapy [facility] for tasks assessed/performed      Past Medical History  Diagnosis Date  . Headache     migraines  . Depression   . Restless legs syndrome   . IBS (irritable bowel syndrome)   . Palpitations   . GERD (gastroesophageal reflux disease)   . Arthritis   . PONV (postoperative nausea and vomiting)     Past Surgical History  Procedure Laterality Date  . Colonoscopy    . Back surgery      1 laminectomy, 1 fusion  . Wisdom tooth extraction    . Abdominoplasty      There were no vitals filed for this visit.  Visit Diagnosis:  Pain in joint, shoulder region, right  Decreased ROM of right shoulder      Subjective Assessment - 02/17/15 1702    Subjective pt reports her clavicle is less sore, but she is still having posterior shoulder stiffness and pain.    Patient Stated Goals reduce pain before surgery   Currently in Pain? Yes   Pain Score 2    Pain Location --  R shoulder    Pain Onset 1 to 4 weeks ago      Therex: Shoulder protraction on wall 2x10 D2 flexion 0lbs 2x8 D1 shoulder extension : red band 2x10 side lying abduction - 2lbs 2x10 side lying shoulder flexion 0lbs 2x10 Side lying ER 2lbs 2x10 Mobilization with movement into shoulder flexion (with PT assist) 3x10  Manual therapy: [place] joint mobs grade 3 posterior inferior and anterior superior 2x20 AC joint  mobs inferior and superior grade 3 2x20 Inferior and posterior GH glides grade 3-4 2x30s  Shoulder dystraction grade 2-3 2x15  Pt demonstrates full shoulder flexion AROM in sitting following therapy without pain                                         PT Long Term Goals - 02/17/15 1704    PT LONG TERM GOAL #1   Title pt will be independent with HEP to reduce pain, improve flexibility and maximize function.    Time 8   Period Weeks   Status Partially Met   PT LONG TERM GOAL #2   Title pt will elevate shoulder to 160 deg flexion with pain <3/10 with normal scapular/humeral rhythem.    Time 8   Period Weeks   Status Partially Met   PT LONG TERM GOAL #3   Title pt will be able to do her hair with sufficient ER and pain <3/10 in R shoulder   Time 8   Period Weeks   Status Partially Met   PT LONG TERM GOAL #4   Title pt will be able to lift 5lb item into  over head cabinet x 5 without pain to return to regular ADLs   Time 4   Period Weeks   Status New               Plan - 02/17/15 1703    Clinical Impression Statement pt responds well to manual therapy and mobilization with movement. pt demonstrates improved arm strength, but still deficits when compared to her PLOF. pt has mid range posterior shoulder pain with some exercises in area of infraspinatus muscle belly.    Pt will benefit from skilled therapeutic intervention in order to improve on the following deficits Hypomobility;Pain;Impaired UE functional use;Decreased strength;Decreased activity tolerance;Decreased range of motion;Impaired flexibility   Rehab Potential Good   PT Frequency 2x / week   PT Duration 8 weeks   PT Treatment/Interventions Aquatic Therapy;ADLs/Self Care Home Management;Cryotherapy;Electrical Stimulation;Iontophoresis 37m/ml Dexamethasone;Moist Heat;Therapeutic exercise;Therapeutic activities;Ultrasound;Neuromuscular re-education;Manual techniques;Taping;Dry  needling;Passive range of motion   Consulted and Agree with Plan of Care Patient        Problem List There are no active problems to display for this patient. AGorden Harms Jermarion Poffenberger, PT, DPT #[id number]  Jamarques Pinedo 02/17/2015, 5:05 PM  CPevelyMAIN RNorthwest Regional Surgery Center LLCSERVICES [address] [place] [address]Phone: [phone]  Fax:  [phone]

## 2015-02-19 ENCOUNTER — Ambulatory Visit: Payer: 59

## 2015-02-19 DIAGNOSIS — M25511 Pain in right shoulder: Secondary | ICD-10-CM

## 2015-02-19 NOTE — Therapy (Signed)
[place] MAIN [facility] SERVICES [address] [place], [place], [address] Phone: [phone]   Fax:  [phone]  Physical Therapy Treatment  Patient Details  Name: LEVENIA SKALICKY MRN: [id number] Date of Birth: Oct 29, 1960 Referring Provider:  Justice Britain, MD  Encounter Date: 02/19/2015      PT End of Session - 02/19/15 1637    Visit Number 13   Number of Visits 24   Date for PT Re-Evaluation 03/04/15   PT Start Time 0932   PT Stop Time 1015   PT Time Calculation (min) 43 min   Activity Tolerance Patient tolerated treatment well   Behavior During Therapy [facility] for tasks assessed/performed      Past Medical History  Diagnosis Date  . Headache     migraines  . Depression   . Restless legs syndrome   . IBS (irritable bowel syndrome)   . Palpitations   . GERD (gastroesophageal reflux disease)   . Arthritis   . PONV (postoperative nausea and vomiting)     Past Surgical History  Procedure Laterality Date  . Colonoscopy    . Back surgery      1 laminectomy, 1 fusion  . Wisdom tooth extraction    . Abdominoplasty      There were no vitals filed for this visit.  Visit Diagnosis:  Pain in joint, shoulder region, right      Subjective Assessment - 02/19/15 1635    Subjective pt reports she felt better after last session regarding her shoulder pain and stiffness. She reports, however that she still cannot sleep comfortably in her bed due to R shoulder.   Patient Stated Goals reduce pain before surgery   Currently in Pain? Yes   Pain Score 2    Pain Location --  R shoulder    Pain Onset 1 to 4 weeks ago             Therex: Shoulder protraction on  Incline table with small push up x10 Incline weight shift (shoulder taps) 2x10 D2 flexion 0lbs 2x10 D1 shoulder extension : red band 3x10 Standing shoulder ER/IR yellow/red 3x10 Wall slide with lift off - yellow band 2x10 Pt requires min verbal and tactile cues for proper  exercise performance especially scapular positioning   Mobilization with movement into shoulder flexion (with PT assist) 3x10  Manual therapy: [place] joint mobs grade 3 posterior inferior and anterior superior 2x20 AC joint mobs inferior and superior grade 3 2x20 Inferior and posterior GH glides grade 3-4 2x30s  Shoulder dystraction grade 2-3 2x15  Pt demonstrates full shoulder flexion AROM in sitting following therapy without pain                                PT Education - 02/19/15 1636    Education provided Yes   Education Details inclined weight shift and incline push up at home    Person(s) Educated Patient   Methods Explanation   Comprehension Verbalized understanding;Returned demonstration             PT Long Term Goals - 02/17/15 1704    PT LONG TERM GOAL #1   Title pt will be independent with HEP to reduce pain, improve flexibility and maximize function.    Time 8   Period Weeks   Status Partially Met   PT LONG TERM GOAL #2   Title pt will elevate shoulder to 160 deg  flexion with pain <3/10 with normal scapular/humeral rhythem.    Time 8   Period Weeks   Status Partially Met   PT LONG TERM GOAL #3   Title pt will be able to do her hair with sufficient ER and pain <3/10 in R shoulder   Time 8   Period Weeks   Status Partially Met   PT LONG TERM GOAL #4   Title pt will be able to lift 5lb item into over head cabinet x 5 without pain to return to regular ADLs   Time 4   Period Weeks   Status New               Plan - 02/19/15 1637    Clinical Impression Statement pt continues to progress well with her strengthening program. she does still exhibit a painful arc but reports it is improving. pt respondes well to manual therapy and mobilization with movement reporting less pain and stiffness   Pt will benefit from skilled therapeutic intervention in order to improve on the following deficits Hypomobility;Pain;Impaired UE functional  use;Decreased strength;Decreased activity tolerance;Decreased range of motion;Impaired flexibility   Rehab Potential Good   PT Frequency 2x / week   PT Duration 8 weeks   PT Treatment/Interventions Aquatic Therapy;ADLs/Self Care Home Management;Cryotherapy;Electrical Stimulation;Iontophoresis 5m/ml Dexamethasone;Moist Heat;Therapeutic exercise;Therapeutic activities;Ultrasound;Neuromuscular re-education;Manual techniques;Taping;Dry needling;Passive range of motion   Consulted and Agree with Plan of Care Patient        Problem List There are no active problems to display for this patient.  AGorden Harms Tortorici, PT, DPT #[phone] Tortorici,[place] 02/19/2015, 4:38 PM  CLake San MarcosMAIN RHaven Behavioral Health Of Eastern PennsylvaniaSERVICES [address] [address]Phone: [phone]  Fax:  [phone]

## 2015-02-23 ENCOUNTER — Ambulatory Visit: Payer: 59

## 2015-02-23 DIAGNOSIS — M25511 Pain in right shoulder: Secondary | ICD-10-CM

## 2015-02-23 DIAGNOSIS — M25611 Stiffness of right shoulder, not elsewhere classified: Secondary | ICD-10-CM

## 2015-02-23 NOTE — Therapy (Signed)
[place] MAIN [facility] SERVICES [address] [place], [place], [address] Phone: [phone]   Fax:  [phone]  Physical Therapy Treatment  Patient Details  Name: Lori Morales MRN: [id number] Date of Birth: Jun 04, 1960 Referring Provider:  Justice Britain, MD  Encounter Date: 02/23/2015      PT End of Session - 02/23/15 1234    Visit Number 14   Number of Visits 24   Date for PT Re-Evaluation 03/04/15   PT Start Time 0935   PT Stop Time 1015   PT Time Calculation (min) 40 min   Activity Tolerance Patient tolerated treatment well   Behavior During Therapy [facility] for tasks assessed/performed      Past Medical History  Diagnosis Date  . Headache     migraines  . Depression   . Restless legs syndrome   . IBS (irritable bowel syndrome)   . Palpitations   . GERD (gastroesophageal reflux disease)   . Arthritis   . PONV (postoperative nausea and vomiting)     Past Surgical History  Procedure Laterality Date  . Colonoscopy    . Back surgery      1 laminectomy, 1 fusion  . Wisdom tooth extraction    . Abdominoplasty      There were no vitals filed for this visit.  Visit Diagnosis:  Pain in joint, shoulder region, right  Decreased ROM of right shoulder      Subjective Assessment - 02/23/15 1233    Subjective Pt relates she has noticed her right arm is stronger and was not as sore after seeing her granddaughter this weekend. Pt denies any current pain in her shoulder but is sore in her anterior shoulder and upper trap.     Patient Stated Goals reduce pain before surgery   Currently in Pain? No/denies  sorenss   Pain Score 0-No pain   Pain Location Shoulder   Pain Onset 1 to 4 weeks ago        Therex: D2 flexion in standing 2x10 D2 flexion in standing with yellow band 2x10 Standing shoulder ER/IR red band with towel roll 2x10 Wall slide with lift off  2x10 Pt requires min verbal, visual and tactile cues for proper exercise  technique   Mobilization with movement into shoulder flexion (with PT assist) 3x10  Manual therapy: [place] joint mobs grade 3 posterior, inferior and superior 2x30 sec AC joint mobs inferior and superior grade 3 2x20 Inferior and posterior GH glides grade 3-4 3x30s   Shoulder dystraction grade 2-3 3x15  Pt demonstrates full shoulder flexion AROM in standing at the end of the session                             PT Education - 02/23/15 1234    Education provided Yes   Education Details HEP progression    Person(s) Educated Patient   Methods Explanation   Comprehension Verbalized understanding             PT Long Term Goals - 02/17/15 1704    PT LONG TERM GOAL #1   Title pt will be independent with HEP to reduce pain, improve flexibility and maximize function.    Time 8   Period Weeks   Status Partially Met   PT LONG TERM GOAL #2   Title pt will elevate shoulder to 160 deg flexion with pain <3/10 with normal scapular/humeral rhythem.    Time 8  Period Weeks   Status Partially Met   PT LONG TERM GOAL #3   Title pt will be able to do her hair with sufficient ER and pain <3/10 in R shoulder   Time 8   Period Weeks   Status Partially Met   PT LONG TERM GOAL #4   Title pt will be able to lift 5lb item into over head cabinet x 5 without pain to return to regular ADLs   Time 4   Period Weeks   Status New               Problem List There are no active problems to display for this patient.  Lori Morales, SPT This entire session was performed under direct supervision and direction of a licensed Chiropractor . I have personally read, edited and approve of the note as written. Gorden Harms. Lori Morales, PT, DPT [phone]  Lori Morales,Lori Morales 02/24/2015, 10:25 AM  [place] MAIN [facility] SERVICES [address] [place], [place], [address] Phone: [phone]   Fax:  [phone]

## 2015-02-26 ENCOUNTER — Ambulatory Visit: Payer: 59

## 2015-02-26 DIAGNOSIS — M25611 Stiffness of right shoulder, not elsewhere classified: Secondary | ICD-10-CM

## 2015-02-26 DIAGNOSIS — M25511 Pain in right shoulder: Secondary | ICD-10-CM

## 2015-02-26 NOTE — Therapy (Signed)
[place] MAIN [facility] [address] [place], [place], [address] Phone: [phone]   Fax:  [phone]  Physical Therapy Treatment  Patient Details  Name: Lori Morales MRN: [id number] Date of Birth: 31-Mar-1961 Referring Provider:  Justice Britain, MD  Encounter Date: 02/26/2015      PT End of Session - 02/26/15 1057    Visit Number 15   Number of Visits 24   Date for PT Re-Evaluation 03/04/15   PT Start Time 0930   PT Stop Time 1020   PT Time Calculation (min) 50 min   Activity Tolerance Patient tolerated treatment well   Behavior During Therapy [facility] for tasks assessed/performed      Past Medical History  Diagnosis Date  . Headache     migraines  . Depression   . Restless legs syndrome   . IBS (irritable bowel syndrome)   . Palpitations   . GERD (gastroesophageal reflux disease)   . Arthritis   . PONV (postoperative nausea and vomiting)     Past Surgical History  Procedure Laterality Date  . Colonoscopy    . Back surgery      1 laminectomy, 1 fusion  . Wisdom tooth extraction    . Abdominoplasty      There were no vitals filed for this visit.  Visit Diagnosis:  Pain in joint, shoulder region, right  Decreased ROM of right shoulder      Subjective Assessment - 02/26/15 1201    Subjective pt relates she has been since Tuesday afternoon after taking blood pressures manually and using Doppler for ABI for ~3 hours.  Her pain/soreness is located in her upper right trap and teres minor.  Pt notes pain with motions involving shoulder flexion and external rotation such as wiping windows in a clockwise pattern.     Patient Stated Goals reduce pain before surgery   Pain Score 2    Pain Location Shoulder   Pain Orientation Right   Pain Onset 1 to 4 weeks ago   Pain Score 2        Therex: D2 flexion in standing x10 D2 flexion in standing with yellow band x10 Wall slide with lift and yellow band 2x10 Shoulder  external rotation with shoulder abducted at 90 degrees  Shoulder external rotation with shoulder abducted at 90 degrees and with yellow band x10 Lawnmower exercise x10 Lawnmower exercise with yellow band 2x10 Robber exercise x10 Robber exercise with yellow band 2x10 Right shoulder horizontal stretch 2x20 sec Shoulder internal rotation AAROM with strap x10 Pt required min verbal and tactile cues for correct exercise technique and from shrugging right shoulder  Pt display equal shoulder ROM in all planes, she does have pain with shoulder internal rotation in her upper trap and teres minoer  Moist heat x7 min to right shoulder                          PT Education - 02/26/15 1057    Education provided Yes   Education Details plan of care and new exercises for HEP   Person(s) Educated Patient   Methods Explanation   Comprehension Verbalized understanding             PT Long Term Goals - 02/17/15 1704    PT LONG TERM GOAL #1   Title pt will be independent with HEP to reduce pain, improve flexibility and maximize function.    Time 8  Period Weeks   Status Partially Met   PT LONG TERM GOAL #2   Title pt will elevate shoulder to 160 deg flexion with pain <3/10 with normal scapular/humeral rhythem.    Time 8   Period Weeks   Status Partially Met   PT LONG TERM GOAL #3   Title pt will be able to do her hair with sufficient ER and pain <3/10 in R shoulder   Time 8   Period Weeks   Status Partially Met   PT LONG TERM GOAL #4   Title pt will be able to lift 5lb item into over head cabinet x 5 without pain to return to regular ADLs   Time 4   Period Weeks   Status New               Plan - 02/26/15 1205    Clinical Impression Statement Pt was educated on HEP routine (warm-up, exercises, and end with stretches) to strengthen her shoulder strength.  Pt presents with increased tension in her upper trap and teres minor and pt noted decreased  pain/tension at the end of the session.  pt did well with strength progression and requires min verbal/tactile cues from shrugging right shoulder.     Pt will benefit from skilled therapeutic intervention in order to improve on the following deficits Hypomobility;Pain;Impaired UE functional use;Decreased strength;Decreased activity tolerance;Decreased range of motion;Impaired flexibility   Rehab Potential Good   PT Frequency 2x / week   PT Duration 8 weeks   PT Treatment/Interventions Aquatic Therapy;ADLs/Self Care Home Management;Cryotherapy;Electrical Stimulation;Iontophoresis 59m/ml Dexamethasone;Moist Heat;Therapeutic exercise;Therapeutic activities;Ultrasound;Neuromuscular re-education;Manual techniques;Taping;Dry needling;Passive range of motion   PT Next Visit Plan HEP progression / progress note ?   Consulted and Agree with Plan of Care Patient        Problem List There are no active problems to display for this patient.  JRenford Dills SPT This entire session was performed under direct supervision and direction of a licensed tChiropractor. I have personally read, edited and approve of the note as written. AGorden Harms Tortorici, PT, DPT #[phone] Tortorici,Ashley 02/26/2015, 12:48 PM  [place] [address] [address]Phone: [phone]  Fax:  [phone]

## 2015-03-02 ENCOUNTER — Ambulatory Visit: Payer: 59 | Attending: Surgery

## 2015-03-02 DIAGNOSIS — M25511 Pain in right shoulder: Secondary | ICD-10-CM | POA: Diagnosis present

## 2015-03-02 DIAGNOSIS — M542 Cervicalgia: Secondary | ICD-10-CM | POA: Diagnosis present

## 2015-03-02 DIAGNOSIS — M25611 Stiffness of right shoulder, not elsewhere classified: Secondary | ICD-10-CM

## 2015-03-02 NOTE — Patient Instructions (Signed)
[email or web address] Upper trap stretch 5x30 sec Levator scapulae stretch 5x30 sec pec stretch 5x30 sec Horizontal adduction stretch 5x30 sec Wall slide with lift 3x10 Shoulder extension with red band 3x10 Bilateral shoulders rows 3x10 D1 extension 3x10 D2 flexion 3x10 Serratus anterior punch 3x10

## 2015-03-02 NOTE — Therapy (Signed)
[place] MAIN [facility] [address] [place], [place], [address] Phone: [phone]   Fax:  [phone]  Physical Therapy Treatment/Discharge Note 9/20-10-3/16  Patient Details  Name: Lori Morales MRN: [id number] Date of Birth: [birth year] Referring Provider:  Justice Britain, MD  Encounter Date: 03/02/2015      PT End of Session - 03/02/15 1137    Visit Number 16   Number of Visits 24   Date for PT Re-Evaluation 03/04/15   PT Start Time 1000   PT Stop Time 1045   PT Time Calculation (min) 45 min   Activity Tolerance Patient tolerated treatment well   Behavior During Therapy [facility] for tasks assessed/performed      Past Medical History  Diagnosis Date  . Headache     migraines  . Depression   . Restless legs syndrome   . IBS (irritable bowel syndrome)   . Palpitations   . GERD (gastroesophageal reflux disease)   . Arthritis   . PONV (postoperative nausea and vomiting)     Past Surgical History  Procedure Laterality Date  . Colonoscopy    . Back surgery      1 laminectomy, 1 fusion  . Wisdom tooth extraction    . Abdominoplasty      There were no vitals filed for this visit.  Visit Diagnosis:  Pain in joint, shoulder region, right  Decreased ROM of right shoulder  Cervicalgia      Subjective Assessment - 03/02/15 1136    Subjective Pt reports she has been doing well and occasionally has lateral shoulder, upper trap and posterior shoulder pain, especially with flexion and internal rotation movements.  Pt is looking forward to graduating from PT continuing making gains on her own.     Patient Stated Goals reduce pain before surgery   Currently in Pain? No/denies   Pain Score 2    Pain Location Shoulder   Pain Orientation Right   Pain Onset 1 to 4 weeks ago      There ex: Scifit: x2 min  Lift 5# over head x5, to simulate placing dishes overhead.    Upper trap stretch 2x20 sec Levator scapulae stretch 2x20  sec pec stretch 2x20 sec Horizontal adduction stretch 2x20 sec Wall slide with lift 2x10 Shoulder extension with red band 2x10 Bilateral shoulders rows 1x10 D1 extension with red band 2x10 D2 flexion with red band x3, increased pain D2 flexion with yellow band x 6, slight increase in pain D2 flexion without resistance x10, minimal pain shoulder Serratus anterior punch with red band 2x10 Pt required min verbal, visual and tactile cues for correct technique Pt required occasional cues to decrease right shoulder shrug                            PT Education - 03/02/15 1136    Education provided Yes   Education Details plan of care, review HEP   Person(s) Educated Patient   Methods Explanation;Demonstration   Comprehension Verbalized understanding;Returned demonstration             PT Long Term Goals - 03/02/15 1158    PT LONG TERM GOAL #1   Title pt will be independent with HEP to reduce pain, improve flexibility and maximize function.    Time 8   Period Weeks   Status Achieved   PT LONG TERM GOAL #2   Title pt will elevate shoulder to 160 deg  flexion with pain <3/10 with normal scapular/humeral rhythem.    Baseline equal shoulder flexion with 2/10 pain    Time 8   Period Weeks   Status Achieved   PT LONG TERM GOAL #3   Title pt will be able to do her hair with sufficient ER and pain <3/10 in R shoulder   Baseline pt is able to do her hair with sufficient ER and 2/10 pain in her right shoulder    Time 8   Period Weeks   Status Achieved   PT LONG TERM GOAL #4   Title pt will be able to lift 5lb item into over head cabinet x 5 without pain to return to regular ADLs   Baseline pt is able to lift 5# weight over head cabinent x5 with less 1/10    Time 4   Period Weeks   Status Partially Met               Plan - 03/02/15 1206    Clinical Impression Statement pt has made significant improvements since the start of PT and has met and  partially met all her goals for PT.  pt has equal Right shoulder AROM compared to left shoulder in all planes.  Pt's deficit is shoulder and periscapular strength which has progressively been improving and will be discharged at this time with a HEP to maintain improve gains made in PT.      Pt will benefit from skilled therapeutic intervention in order to improve on the following deficits Hypomobility;Pain;Impaired UE functional use;Decreased strength;Decreased activity tolerance;Decreased range of motion;Impaired flexibility   Rehab Potential Good   PT Frequency --   PT Duration --   PT Treatment/Interventions Aquatic Therapy;ADLs/Self Care Home Management;Cryotherapy;Electrical Stimulation;Iontophoresis 29m/ml Dexamethasone;Moist Heat;Therapeutic exercise;Therapeutic activities;Ultrasound;Neuromuscular re-education;Manual techniques;Taping;Dry needling;Passive range of motion   PT Next Visit Plan discharged    Consulted and Agree with Plan of Care Patient        Problem List There are no active problems to display for this patient.  JRenford Dills SPT This entire session was performed under direct supervision and direction of a licensed tChiropractor. I have personally read, edited and approve of the note as written. AGorden Harms Lori Morales, PT, DPT #[id number] Lori Morales,Lori Morales 03/02/2015, 5:47 PM  [place] [facility] [address] [address]Phone: [phone]  Fax:  [phone]

## 2015-03-04 ENCOUNTER — Other Ambulatory Visit: Payer: Self-pay | Admitting: Obstetrics and Gynecology

## 2015-03-04 DIAGNOSIS — Z1239 Encounter for other screening for malignant neoplasm of breast: Secondary | ICD-10-CM

## 2015-03-11 ENCOUNTER — Ambulatory Visit
Admission: RE | Admit: 2015-03-11 | Discharge: 2015-03-11 | Disposition: A | Discharge status: Home / Self Care | Payer: 59 | Source: Ambulatory Visit | Attending: Obstetrics and Gynecology | Admitting: Obstetrics and Gynecology

## 2015-03-11 DIAGNOSIS — Z1239 Encounter for other screening for malignant neoplasm of breast: Secondary | ICD-10-CM

## 2015-03-11 DIAGNOSIS — Z1231 Encounter for screening mammogram for malignant neoplasm of breast: Secondary | ICD-10-CM | POA: Insufficient documentation

## 2015-07-21 DIAGNOSIS — N94819 Vulvodynia, unspecified: Secondary | ICD-10-CM | POA: Diagnosis not present

## 2015-07-21 DIAGNOSIS — Z1231 Encounter for screening mammogram for malignant neoplasm of breast: Secondary | ICD-10-CM | POA: Diagnosis not present

## 2015-07-21 DIAGNOSIS — N951 Menopausal and female climacteric states: Secondary | ICD-10-CM | POA: Diagnosis not present

## 2015-07-21 DIAGNOSIS — Z01419 Encounter for gynecological examination (general) (routine) without abnormal findings: Secondary | ICD-10-CM | POA: Diagnosis not present

## 2015-07-22 DIAGNOSIS — E784 Other hyperlipidemia: Secondary | ICD-10-CM | POA: Diagnosis not present

## 2015-07-22 DIAGNOSIS — M519 Unspecified thoracic, thoracolumbar and lumbosacral intervertebral disc disorder: Secondary | ICD-10-CM | POA: Diagnosis not present

## 2015-07-22 DIAGNOSIS — R5383 Other fatigue: Secondary | ICD-10-CM | POA: Diagnosis not present

## 2015-07-29 DIAGNOSIS — F3289 Other specified depressive episodes: Secondary | ICD-10-CM | POA: Diagnosis not present

## 2015-07-29 DIAGNOSIS — M7541 Impingement syndrome of right shoulder: Secondary | ICD-10-CM | POA: Diagnosis not present

## 2015-07-29 DIAGNOSIS — E784 Other hyperlipidemia: Secondary | ICD-10-CM | POA: Diagnosis not present

## 2015-07-29 DIAGNOSIS — Z0001 Encounter for general adult medical examination with abnormal findings: Secondary | ICD-10-CM | POA: Diagnosis not present

## 2015-09-01 ENCOUNTER — Encounter: Payer: Self-pay | Admitting: Physician Assistant

## 2015-09-01 ENCOUNTER — Ambulatory Visit: Payer: Self-pay | Admitting: Physician Assistant

## 2015-09-01 VITALS — BP 150/90 | HR 88 | Temp 98.4°F

## 2015-09-01 DIAGNOSIS — R52 Pain, unspecified: Secondary | ICD-10-CM

## 2015-09-01 DIAGNOSIS — J09X2 Influenza due to identified novel influenza A virus with other respiratory manifestations: Secondary | ICD-10-CM

## 2015-09-01 LAB — POCT INFLUENZA A/B
Influenza A, POC: POSITIVE — AB
Influenza B, POC: NEGATIVE

## 2015-09-01 MED ORDER — HYDROCOD POLST-CPM POLST ER 10-8 MG/5ML PO SUER: 5.0000 mL | Freq: Two times a day (BID) | ORAL | Status: DC | PRN

## 2015-09-01 MED ORDER — OSELTAMIVIR PHOSPHATE 75 MG PO CAPS: 75.0000 mg | ORAL_CAPSULE | Freq: Two times a day (BID) | ORAL | Status: DC

## 2015-09-01 NOTE — Progress Notes (Signed)
S: C/o runny nose and congestion with dry cough for 1 days, + fever, chills, body aches, r gland is tender on neck;  denies cp/sob, v/d; mucus was green this am but clear throughout the day, cough is sporadic, body aches are in all large muscles  O: PE: vitals wnl, nad,  perrl eomi, normocephalic, tms dull, nasal mucosa red and swollen, throat injected, neck supple no lymph, lungs c t a, cv rrr, neuro intact, flu swab +A  A:  Acute influenza A  P: tamiflu 75 bid x 5d, tussionex for cough prn, nr; drink fluids, continue regular meds , use otc meds of choice, return if not improving in 5 days, return earlier if worsening

## 2015-10-22 ENCOUNTER — Ambulatory Visit
Admission: EM | Admit: 2015-10-22 | Discharge: 2015-10-22 | Disposition: A | Discharge status: Home / Self Care | Payer: 59 | Attending: Emergency Medicine | Admitting: Emergency Medicine

## 2015-10-22 ENCOUNTER — Encounter: Payer: Self-pay | Admitting: *Deleted

## 2015-10-22 DIAGNOSIS — B029 Zoster without complications: Secondary | ICD-10-CM

## 2015-10-22 MED ORDER — VALACYCLOVIR HCL 1 G PO TABS: 1000.0000 mg | ORAL_TABLET | Freq: Three times a day (TID) | ORAL | Status: DC

## 2015-10-22 MED ORDER — HYDROCODONE-ACETAMINOPHEN 5-325 MG PO TABS: 1.0000 | ORAL_TABLET | Freq: Four times a day (QID) | ORAL | Status: DC | PRN

## 2015-10-22 NOTE — Discharge Instructions (Signed)
Continue your Celebrex. Taper off of the prednisone. Take 1 g of Tylenol up to 4 times a day as needed for pain. This with the Celebrex as an effective combination for pain. Do not take the Vicodin if you're taking the Tylenol, as a BiPAP Tylenol in them and too much Tylenol can hurt your liver. Do not exceed 4 g of Tylenol from all sources in one day. Follow-up with her primary care physician as needed. Go to the ER if the rash disseminates

## 2015-10-22 NOTE — ED Provider Notes (Signed)
HPI  SUBJECTIVE:  Lori Morales is a 55 y.o. female who presents with possible shingles. She reports atraumatic left-sided low back pain with radicular symptoms starting 6 days ago.  She states that the pain is different than her usual back pain. She describes the pain as aching, burning, tingling. She states it started in her back and has traveled distally down her lateral thigh. She noted some hyperesthesias in the lateral aspect of her thigh down to her knee several days ago. She noted a vesicular rash on her left buttock today. She has been taking Celebrex, Vicodin at night. She was started on some prednisone 3 days ago for possible low back pain flare. Symptoms are better with Vicodin and resting, worse with sitting. She denies nausea, vomiting, fevers, weakness, urinary complaints, saddle anesthesia, urinary or fecal incontinence. She states that she has been under an increased amount of stress. Past medical history of back surgeries, chickenpox. No history of diabetes, hypertension. PMD: Dr. Jens Som.  Past Medical History  Diagnosis Date  . Headache     migraines  . Depression   . Restless legs syndrome   . IBS (irritable bowel syndrome)   . Palpitations   . GERD (gastroesophageal reflux disease)   . Arthritis   . PONV (postoperative nausea and vomiting)     Past Surgical History  Procedure Laterality Date  . Colonoscopy    . Back surgery      1 laminectomy, 1 fusion  . Wisdom tooth extraction    . Abdominoplasty      Family History  Problem Relation Age of Onset  . CAD Mother   . Stroke Mother   . Heart attack Father   . Breast cancer Maternal Aunt 53    Social History  Substance Use Topics  . Smoking status: Never Smoker   . Smokeless tobacco: Never Used  . Alcohol Use: No    No current facility-administered medications for this encounter.  Current outpatient prescriptions:  .  amitriptyline (ELAVIL) 10 MG tablet, Take by mouth., Disp: , Rfl:  .  celecoxib  (CELEBREX) 200 MG capsule, Take 200 mg by mouth daily as needed for moderate pain. , Disp: , Rfl:  .  MIMVEY LO 0.5-0.1 MG per tablet, Take 1 tablet by mouth at bedtime. , Disp: , Rfl: 4 .  pantoprazole (PROTONIX) 40 MG tablet, Take 40 mg by mouth at bedtime. , Disp: , Rfl:  .  pramipexole (MIRAPEX) 0.25 MG tablet, Take 0.25 mg by mouth at bedtime., Disp: , Rfl:  .  zolpidem (AMBIEN CR) 6.25 MG CR tablet, , Disp: , Rfl:  .  HYDROcodone-acetaminophen (NORCO/VICODIN) 5-325 MG tablet, Take 1-2 tablets by mouth every 6 (six) hours as needed for moderate pain., Disp: 24 tablet, Rfl: 0 .  valACYclovir (VALTREX) 1000 MG tablet, Take 1 tablet (1,000 mg total) by mouth 3 (three) times daily. X 7 days, Disp: 21 tablet, Rfl: 0  Allergies  Allergen Reactions  . Desvenlafaxine Swelling and Other (See Comments)    Headaches (reaction to Pristiq)  . Penicillins Hives and Rash     ROS  As noted in HPI.   Physical Exam  BP 153/76 mmHg  Pulse 58  Temp(Src) 97.7 F (36.5 C) (Oral)  Resp 18  Ht 5\' 4"  (1.626 m)  Wt 160 lb (72.576 kg)  BMI 27.45 kg/m2  SpO2 99%  LMP 02/13/2012  Constitutional: Well developed, well nourished, no acute distress Eyes:  EOMI, conjunctiva normal bilaterally HENT: Normocephalic, atraumatic,mucus membranes  moist Respiratory: Normal inspiratory effort Cardiovascular: Normal rate GI: nondistended skin: Erythematous vesicular rash on left buttock. No crusting. See picture    Musculoskeletal: Bilateral lower extremities nontender. No bony L spine tenderness.  Sensation baseline light touch bilaterally for Pt,  Motor symmetric bilateral 5/5 hip flexion, quadriceps, hamstrings, EHL, foot dorsiflexion, foot plantarflexion, gait normal Neurologic: Alert & oriented x 3, no focal neuro deficits Psychiatric: Speech and behavior appropriate   ED Course   Medications - No data to display  No orders of the defined types were placed in this encounter.    No results found  for this or any previous visit (from the past 24 hour(s)). No results found.  ED Clinical Impression  Shingles  ED Assessment/Plan  Presentation most consistent with shingles. We'll have patient taper off her prednisone 40 mg. She is on day #3 of this. Also, Valtrex, refill Vicodin. Continue celebrex. Follow-up with primary care physician as needed. To the ER if gets worse.  Discussed MDM, plan and followup with patient. Discussed sn/sx that should prompt return to the ED. Patient  agrees with plan.   *This clinic note was created using Dragon dictation software. Therefore, there may be occasional mistakes despite careful proofreading.  ?   Melynda Ripple, MD 10/22/15 2102

## 2015-10-22 NOTE — ED Notes (Signed)
Patient has been having symptoms of pain and tingling in her left hip radiating down her left leg to her knee. Today she noticed a rash forming on her right hip.

## 2015-11-12 DIAGNOSIS — R030 Elevated blood-pressure reading, without diagnosis of hypertension: Secondary | ICD-10-CM | POA: Diagnosis not present

## 2015-11-12 DIAGNOSIS — M549 Dorsalgia, unspecified: Secondary | ICD-10-CM | POA: Diagnosis not present

## 2015-11-12 DIAGNOSIS — Z6827 Body mass index (BMI) 27.0-27.9, adult: Secondary | ICD-10-CM | POA: Diagnosis not present

## 2016-03-01 ENCOUNTER — Other Ambulatory Visit: Payer: Self-pay | Admitting: Obstetrics and Gynecology

## 2016-03-01 DIAGNOSIS — Z1231 Encounter for screening mammogram for malignant neoplasm of breast: Secondary | ICD-10-CM

## 2016-03-02 DIAGNOSIS — M5010 Cervical disc disorder with radiculopathy, unspecified cervical region: Secondary | ICD-10-CM | POA: Diagnosis not present

## 2016-03-02 DIAGNOSIS — M542 Cervicalgia: Secondary | ICD-10-CM | POA: Diagnosis not present

## 2016-03-02 DIAGNOSIS — M47812 Spondylosis without myelopathy or radiculopathy, cervical region: Secondary | ICD-10-CM | POA: Diagnosis not present

## 2016-03-07 ENCOUNTER — Other Ambulatory Visit: Payer: Self-pay | Admitting: Physician Assistant

## 2016-03-07 DIAGNOSIS — M5412 Radiculopathy, cervical region: Secondary | ICD-10-CM

## 2016-03-21 ENCOUNTER — Ambulatory Visit
Admission: RE | Admit: 2016-03-21 | Discharge: 2016-03-21 | Disposition: A | Discharge status: Home / Self Care | Payer: 59 | Source: Ambulatory Visit | Attending: Physician Assistant | Admitting: Physician Assistant

## 2016-03-21 DIAGNOSIS — M4722 Other spondylosis with radiculopathy, cervical region: Secondary | ICD-10-CM | POA: Diagnosis not present

## 2016-03-21 DIAGNOSIS — M5412 Radiculopathy, cervical region: Secondary | ICD-10-CM | POA: Diagnosis present

## 2016-03-21 DIAGNOSIS — M4802 Spinal stenosis, cervical region: Secondary | ICD-10-CM | POA: Diagnosis not present

## 2016-03-21 DIAGNOSIS — Z1231 Encounter for screening mammogram for malignant neoplasm of breast: Secondary | ICD-10-CM | POA: Diagnosis not present

## 2016-03-21 DIAGNOSIS — M50122 Cervical disc disorder at C5-C6 level with radiculopathy: Secondary | ICD-10-CM | POA: Diagnosis not present

## 2016-03-21 DIAGNOSIS — M47812 Spondylosis without myelopathy or radiculopathy, cervical region: Secondary | ICD-10-CM | POA: Insufficient documentation

## 2016-03-21 DIAGNOSIS — M50123 Cervical disc disorder at C6-C7 level with radiculopathy: Secondary | ICD-10-CM | POA: Diagnosis not present

## 2016-03-22 ENCOUNTER — Ambulatory Visit
Admission: RE | Admit: 2016-03-22 | Discharge: 2016-03-22 | Disposition: A | Discharge status: Home / Self Care | Payer: 59 | Source: Ambulatory Visit | Attending: Obstetrics and Gynecology | Admitting: Obstetrics and Gynecology

## 2016-03-22 DIAGNOSIS — Z1231 Encounter for screening mammogram for malignant neoplasm of breast: Secondary | ICD-10-CM | POA: Diagnosis not present

## 2016-03-22 DIAGNOSIS — M4802 Spinal stenosis, cervical region: Secondary | ICD-10-CM | POA: Diagnosis not present

## 2016-03-22 DIAGNOSIS — M4722 Other spondylosis with radiculopathy, cervical region: Secondary | ICD-10-CM | POA: Diagnosis not present

## 2016-04-11 NOTE — Progress Notes (Signed)
[facility] [place], Lori Morales [address] Phone: [phone] Subjective:    I'm seeing this patient by the request  of:  Lori Briscoe Burns III, MD   CC: neck pain   [id number]  Lori Morales is a 55 y.o. female coming in with complaint of neck pain .  Seen by another provider. atient has bpain for quite some time.Patient has been on many different medications including amitriptyline, Celebrex,lidocainetopical.Starting have pain mostly on the left side of the neck going down the left arm.Seems to have tingling in the left fourth and fifth fingers.Patient states some things can make it worse likes sitting an a computer.atient rates the severity of pain a 7 out of 10 and worsening. Affecting daily activities. Can even keep her up at night.    previous imaging includes a MRI of the cervical spine 03/21/2016. This was independently visualized by me. atient's MRI does show a C6-C7 moderate left foraminal stenosis.Mild degenerative changes otherwise.  Past Medical History:  Diagnosis Date  . Arthritis   . Depression   . GERD (gastroesophageal reflux disease)   . Headache    migraines  . IBS (irritable bowel syndrome)   . Palpitations   . PONV (postoperative nausea and vomiting)   . Restless legs syndrome    Past Surgical History:  Procedure Laterality Date  . ABDOMINOPLASTY    . BACK SURGERY     1 laminectomy, 1 fusion  . COLONOSCOPY    . WISDOM TOOTH EXTRACTION     Social History   Social History  . Marital status: Married    Spouse name: N/A  . Number of children: N/A  . Years of education: N/A   Social History Main Topics  . Smoking status: Never Smoker  . Smokeless tobacco: Never Used  . Alcohol use No  . Drug use: No  . Sexual activity: Not on file   Other Topics Concern  . Not on file   Social History Narrative  . No narrative on file   Allergies  Allergen Reactions  . Desvenlafaxine Swelling and Other (See Comments)    Headaches (reaction to Pristiq)  . Penicillins Hives and Rash   Family History  Problem Relation Age of Onset  . CAD Mother   . Stroke Mother   . Heart attack Father   . Breast cancer Maternal Aunt 53    Past medical history, social, surgical and family history all reviewed in electronic medical record.  No pertanent information unless stated regarding to the chief complaint.   Review of Systems:Review of systems updated and as accurate as of 04/11/16  No headache, visual changes, nausea, vomiting, diarrhea, constipation, dizziness, abdominal pain, skin rash, fevers, chills, night sweats, weight loss, swollen lymph nodes, body aches, joint swelling, muscle aches, chest pain, shortness of breath, mood changes.   Objective  Last menstrual period 02/13/2012. Systems examined below as of 04/11/16   General: No apparent distress alert and oriented x3 mood and affect normal, dressed appropriately.  HEENT: Pupils equal, extraocular movements intact  Respiratory: Patient's speak in full sentences and does not appear short of breath  Cardiovascular: No lower extremity edema, non tender, no erythema  Skin: Warm dry intact with no signs of infection or rash on extremities or on axial skeleton.  Abdomen: Soft nontender  Neuro: Cranial nerves II through XII are intact, neurovascularly intact in all extremities with 2+ DTRs and 2+ pulses.  Lymph: No lymphadenopathy of posterior or anterior  cervical chain or axillae bilaterally.  Gait normal with good balance and coordination.  MSK:  Non tender with full range of motion and good stability and symmetric strength and tone of shoulders, elbows, wrist, hip, knee and ankles bilaterally.  Neck: Inspection unremarkable. No palpable stepoffs. Negative Spurling's maneuver. Limited to 5 degrees of extension. Worsening radiation down the  Left arm.  Grip strength and sensation normal in bilateral hands Strength good C4 to T1 distribution Negative Hoffman  sign bilaterally Reflexes normal  Osteopathic findings T3 E RS left with inhaled 3rd rib  Procedure note 97110; 15 minutes spent for Therapeutic exercises as stated in above notes.  This included exercises focusing on stretching, strengthening, with significant focus on eccentric aspects.   Basic scapular stabilization to include adduction and depression of scapula Scaption, focusing on proper movement and good control Internal and External rotation utilizing a theraband, with elbow tucked at side entire time Rows with theraband    Proper technique shown and discussed handout in great detail with ATC.  All questions were discussed and answered.     Impression and Recommendations:     This case required medical decision making of moderate complexity.      Note: This dictation was prepared with Dragon dictation along with smaller phrase technology. Any transcriptional errors that result from this process are unintentional.

## 2016-04-12 ENCOUNTER — Ambulatory Visit (INDEPENDENT_AMBULATORY_CARE_PROVIDER_SITE_OTHER): Payer: 59 | Admitting: Family Medicine

## 2016-04-12 ENCOUNTER — Encounter: Payer: Self-pay | Admitting: Family Medicine

## 2016-04-12 DIAGNOSIS — M5010 Cervical disc disorder with radiculopathy, unspecified cervical region: Secondary | ICD-10-CM

## 2016-04-12 DIAGNOSIS — M999 Biomechanical lesion, unspecified: Secondary | ICD-10-CM | POA: Diagnosis not present

## 2016-04-12 MED ORDER — VENLAFAXINE HCL ER 37.5 MG PO CP24: 37.5000 mg | ORAL_CAPSULE | Freq: Every day | ORAL | 1 refills | Status: DC

## 2016-04-12 MED ORDER — PREDNISONE 50 MG PO TABS: 50.0000 mg | ORAL_TABLET | Freq: Every day | ORAL | 0 refills | Status: DC

## 2016-04-12 NOTE — Assessment & Plan Note (Signed)
Decision today to treat with OMT was based on Physical Exam  After verbal consent patient was treated with HVLA, ME techniques in thoracic and rib areas  Patient tolerated the procedure well with improvement in symptoms  Patient given exercises, stretches and lifestyle modifications  See medications in patient instructions if given  Patient will follow up in 3-4 weeks              

## 2016-04-12 NOTE — Patient Instructions (Signed)
God to see you  Ice 20 minutes 2 times daily. Usually after activity and before bed. Exercises 3 times a week.  Avoid activity with hands outside of peripheral vision.  Prednisone daily for 5 days.  effexor 37.5 mg daily  See me again in 2 weeks or send me a message in 1 week if not better and we will get epidural.

## 2016-04-12 NOTE — Assessment & Plan Note (Signed)
atient does have more of a cervical radiculopathy. Patient even prednisone, Effexor, we discussed home exercises and patient work with Product/process development scientist. We discussed icing regimen. Patient will continue to be active. We discussed though if worsening symptoms patient may respond well to epidural or nerve root injection at the C6-C7 level XCs contributing to the discomfort that she is having.Patient will come back anll do increasing mulation of the cervical spine at that time if patient's radicular symptoms has improved.

## 2016-04-18 ENCOUNTER — Encounter: Payer: Self-pay | Admitting: Family Medicine

## 2016-04-18 DIAGNOSIS — M5010 Cervical disc disorder with radiculopathy, unspecified cervical region: Secondary | ICD-10-CM

## 2016-04-20 IMAGING — MG MM SCREENING BREAST TOMO BILATERAL
9 of 12 series · 9 of 28 positions shown · non-contrast
Comparison: Previous exam(s).

CLINICAL DATA: Screening.

EXAM:
DIGITAL SCREENING BILATERAL MAMMOGRAM WITH 3D TOMO WITH CAD

[R MLO synth-2D]
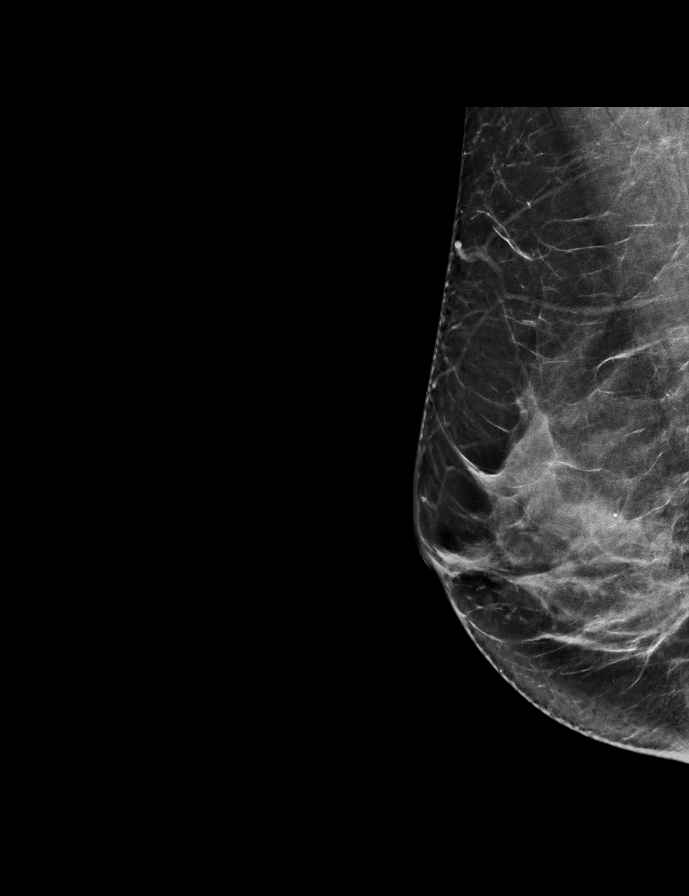

[L MLO synth-2D]
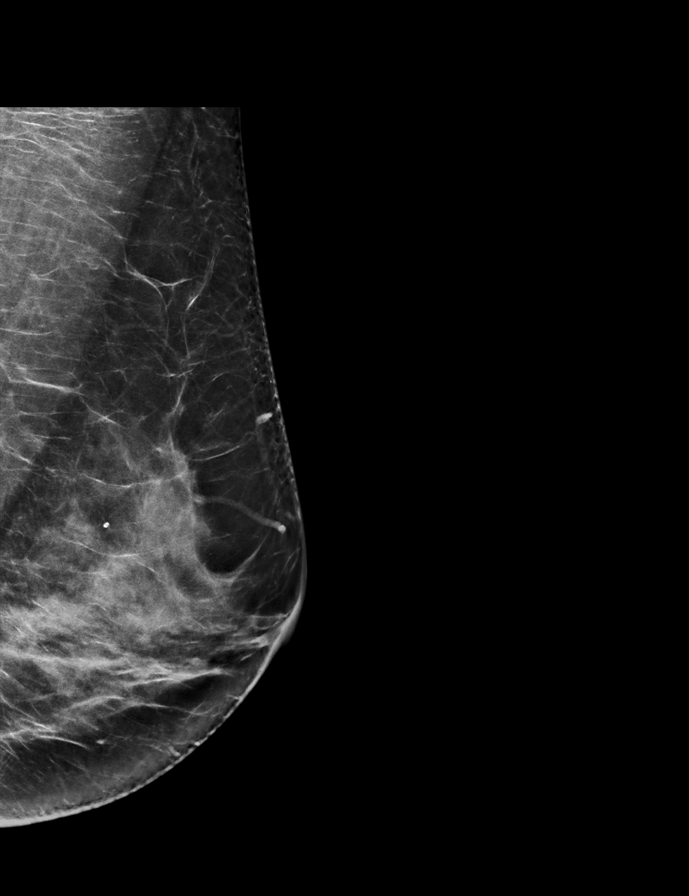

[R MLO]
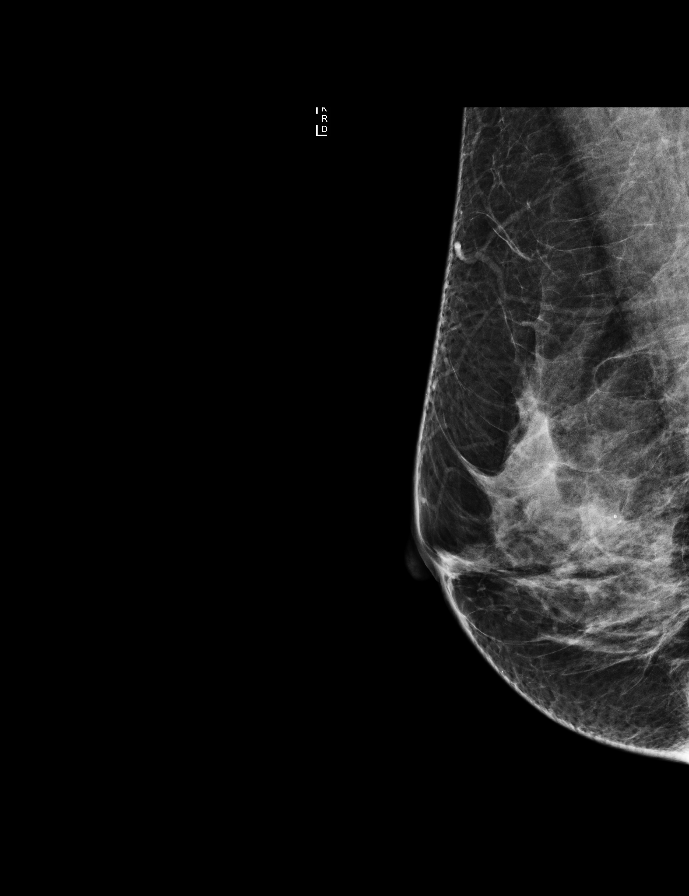

[L CC synth-2D]
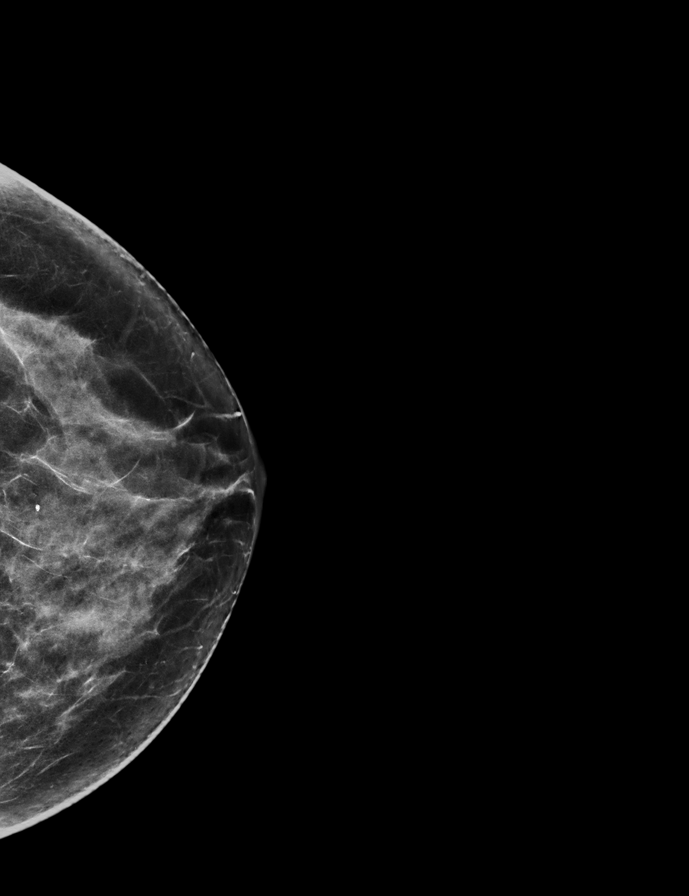

[R CC synth-2D]
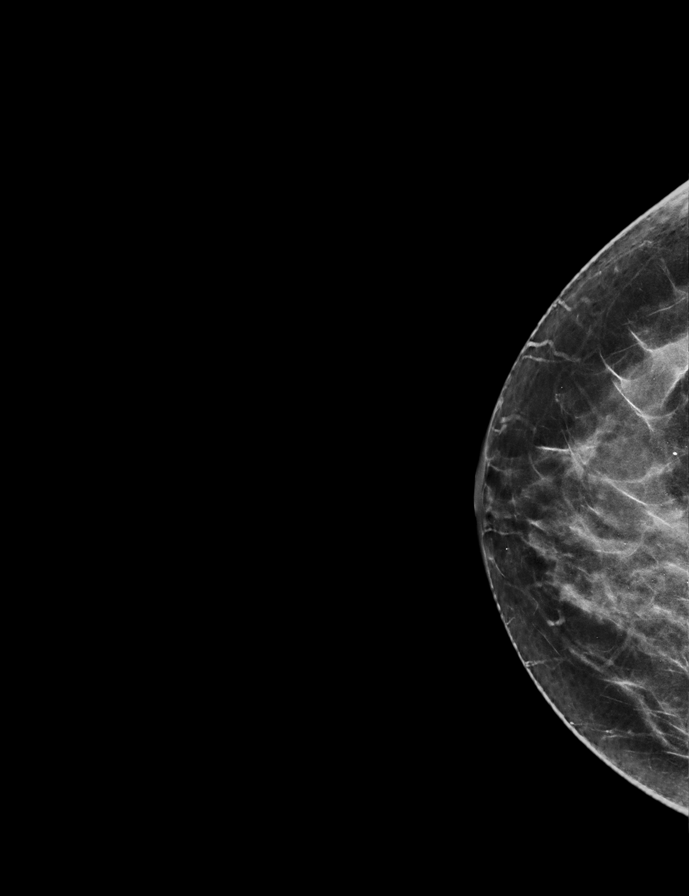

[L MLO]
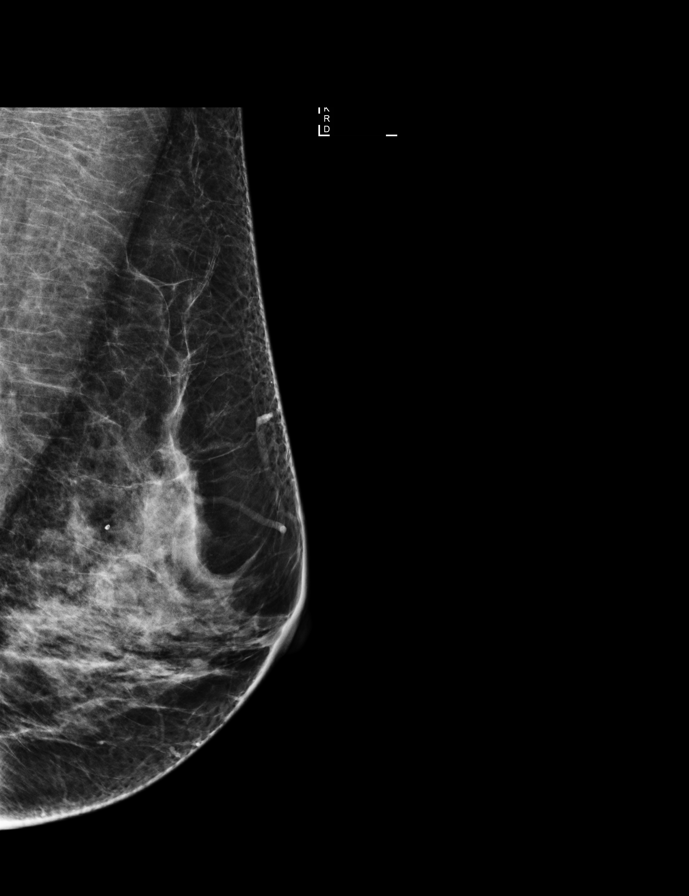

[L CC]
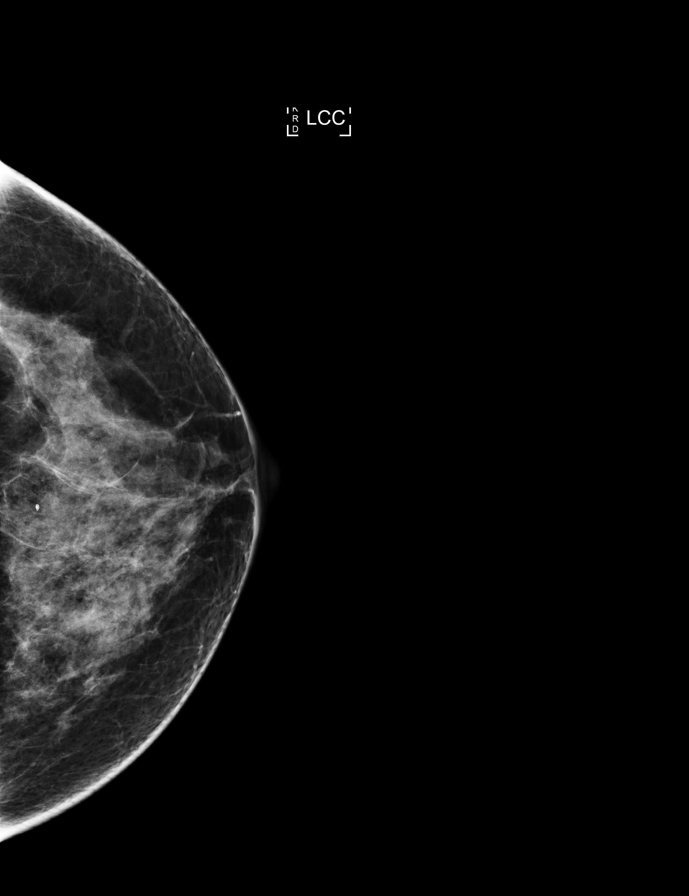

[R CC]
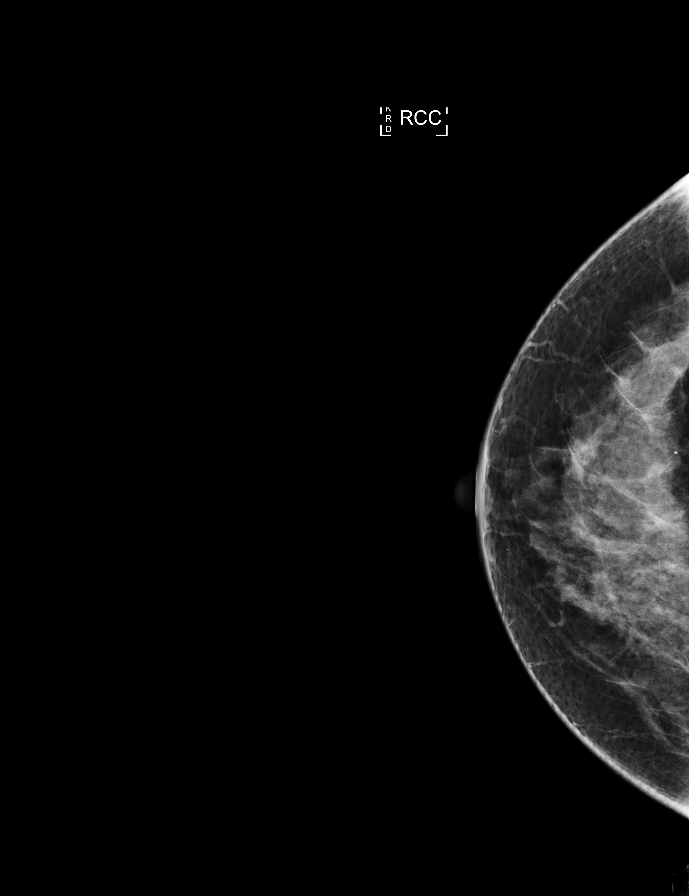

[L MLO tomo · tomo slice 37/73.0]
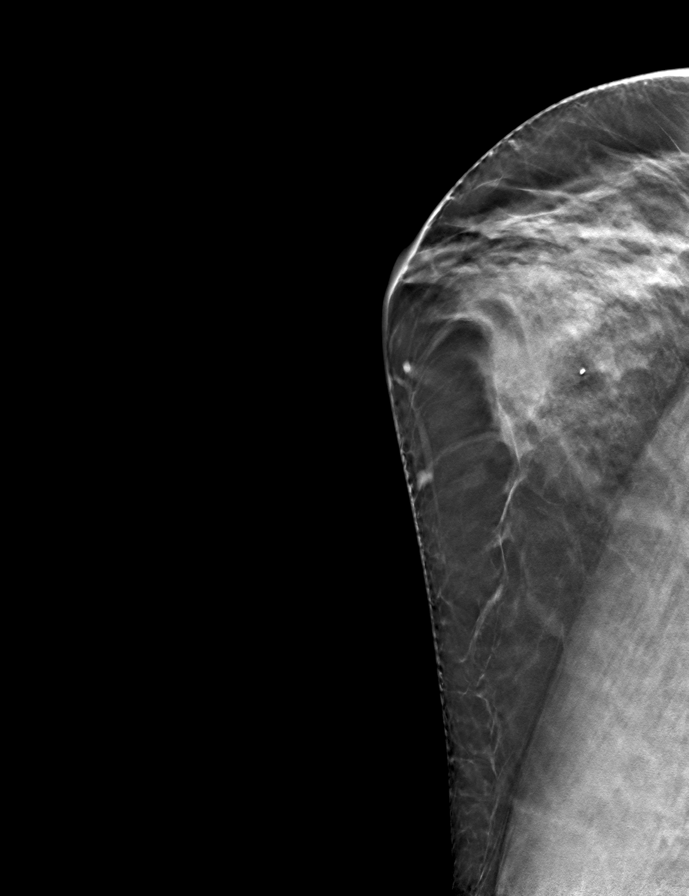

[9 of 28 positions shown; findings below may reference images not displayed]

ACR Breast Density Category c: The breast tissue is heterogeneously
dense, which may obscure small masses.
FINDINGS: There are no findings suspicious for malignancy. Images were
processed with CAD.
IMPRESSION: No mammographic evidence of malignancy. A result letter of this
screening mammogram will be mailed directly to the patient.

RECOMMENDATION:
Screening mammogram in one year. (Code:OA-G-1SS)

BI-RADS CATEGORY  1: Negative.

## 2016-04-28 ENCOUNTER — Ambulatory Visit
Admission: RE | Admit: 2016-04-28 | Discharge: 2016-04-28 | Disposition: A | Discharge status: Home / Self Care | Payer: 59 | Source: Ambulatory Visit | Attending: Family Medicine | Admitting: Family Medicine

## 2016-04-28 DIAGNOSIS — M50222 Other cervical disc displacement at C5-C6 level: Secondary | ICD-10-CM | POA: Diagnosis not present

## 2016-04-28 DIAGNOSIS — M5010 Cervical disc disorder with radiculopathy, unspecified cervical region: Secondary | ICD-10-CM

## 2016-04-28 MED ORDER — IOPAMIDOL (ISOVUE-M 300) INJECTION 61%: 1.0000 mL | Freq: Once | INTRAMUSCULAR | Status: DC | PRN

## 2016-04-28 MED ORDER — TRIAMCINOLONE ACETONIDE 40 MG/ML IJ SUSP (RADIOLOGY): 60.0000 mg | Freq: Once | INTRAMUSCULAR | Status: DC

## 2016-04-28 NOTE — Discharge Instructions (Signed)

## 2016-05-18 DIAGNOSIS — Z808 Family history of malignant neoplasm of other organs or systems: Secondary | ICD-10-CM | POA: Diagnosis not present

## 2016-05-18 DIAGNOSIS — L578 Other skin changes due to chronic exposure to nonionizing radiation: Secondary | ICD-10-CM | POA: Diagnosis not present

## 2016-05-18 DIAGNOSIS — D0461 Carcinoma in situ of skin of right upper limb, including shoulder: Secondary | ICD-10-CM | POA: Diagnosis not present

## 2016-05-18 DIAGNOSIS — L821 Other seborrheic keratosis: Secondary | ICD-10-CM | POA: Diagnosis not present

## 2016-05-18 DIAGNOSIS — D1800 Hemangioma unspecified site: Secondary | ICD-10-CM | POA: Diagnosis not present

## 2016-05-18 DIAGNOSIS — D485 Neoplasm of uncertain behavior of skin: Secondary | ICD-10-CM | POA: Diagnosis not present

## 2016-05-18 DIAGNOSIS — Z1283 Encounter for screening for malignant neoplasm of skin: Secondary | ICD-10-CM | POA: Diagnosis not present

## 2016-05-18 DIAGNOSIS — D229 Melanocytic nevi, unspecified: Secondary | ICD-10-CM | POA: Diagnosis not present

## 2016-05-18 DIAGNOSIS — L98499 Non-pressure chronic ulcer of skin of other sites with unspecified severity: Secondary | ICD-10-CM | POA: Diagnosis not present

## 2016-05-18 DIAGNOSIS — L812 Freckles: Secondary | ICD-10-CM | POA: Diagnosis not present

## 2016-06-01 ENCOUNTER — Emergency Department: Payer: 59

## 2016-06-01 ENCOUNTER — Emergency Department
Admission: EM | Admit: 2016-06-01 | Discharge: 2016-06-01 | Disposition: A | Discharge status: Home / Self Care | Payer: 59 | Attending: Student in an Organized Health Care Education/Training Program | Admitting: Student in an Organized Health Care Education/Training Program

## 2016-06-01 DIAGNOSIS — R55 Syncope and collapse: Secondary | ICD-10-CM | POA: Insufficient documentation

## 2016-06-01 DIAGNOSIS — R42 Dizziness and giddiness: Secondary | ICD-10-CM | POA: Diagnosis not present

## 2016-06-01 LAB — CBC
HCT: 45.6 % (ref 35.0–47.0)
Hemoglobin: 15.2 g/dL (ref 12.0–16.0)
MCH: 30.9 pg (ref 26.0–34.0)
MCHC: 33.3 g/dL (ref 32.0–36.0)
MCV: 92.6 fL (ref 80.0–100.0)
Platelets: 190 10*3/uL (ref 150–440)
RBC: 4.92 MIL/uL (ref 3.80–5.20)
RDW: 13.5 % (ref 11.5–14.5)
WBC: 7.4 10*3/uL (ref 3.6–11.0)

## 2016-06-01 LAB — COMPREHENSIVE METABOLIC PANEL
ALT: 36 U/L (ref 14–54)
AST: 31 U/L (ref 15–41)
Albumin: 4.4 g/dL (ref 3.5–5.0)
Alkaline Phosphatase: 74 U/L (ref 38–126)
Anion gap: 8 (ref 5–15)
BUN: 11 mg/dL (ref 6–20)
CO2: 28 mmol/L (ref 22–32)
Calcium: 9.5 mg/dL (ref 8.9–10.3)
Chloride: 103 mmol/L (ref 101–111)
Creatinine, Ser: 0.79 mg/dL (ref 0.44–1.00)
GFR calc Af Amer: >60 mL/min (ref >60)
GFR calc non Af Amer: >60 mL/min (ref >60)
Glucose, Bld: 123 mg/dL — ABNORMAL HIGH (ref 65–99)
Potassium: 3.7 mmol/L (ref 3.5–5.1)
Sodium: 139 mmol/L (ref 135–145)
Total Bilirubin: 0.6 mg/dL (ref 0.3–1.2)
Total Protein: 7.5 g/dL (ref 6.5–8.1)

## 2016-06-01 LAB — PROTIME-INR
INR: 0.97
Prothrombin Time: 12.9 seconds (ref 11.4–15.2)

## 2016-06-01 LAB — TROPONIN I
Troponin I: <0.03 ng/mL (ref <0.03)
Troponin I: <0.03 ng/mL (ref <0.03)

## 2016-06-01 LAB — DIFFERENTIAL
Basophils Absolute: 0.1 10*3/uL (ref 0–0.1)
Basophils Relative: 1 %
Eosinophils Absolute: 0 10*3/uL (ref 0–0.7)
Eosinophils Relative: 1 %
Lymphocytes Relative: 14 %
Lymphs Abs: 1.1 10*3/uL (ref 1.0–3.6)
Monocytes Absolute: 0.7 10*3/uL (ref 0.2–0.9)
Monocytes Relative: 10 %
Neutro Abs: 5.5 10*3/uL (ref 1.4–6.5)
Neutrophils Relative %: 74 %

## 2016-06-01 LAB — APTT: aPTT: 33 seconds (ref 24–36)

## 2016-06-01 NOTE — ED Notes (Signed)
Patient transported to CT 

## 2016-06-01 NOTE — ED Triage Notes (Signed)
Pt alert and oriented X4, active, cooperative, pt in NAD. RR even and unlabored, color WNL.    

## 2016-06-01 NOTE — ED Notes (Signed)
Spoke with Dr. Alfred Levins regarding patient sx. Verbal orders received.

## 2016-06-01 NOTE — ED Triage Notes (Addendum)
Pt arrives to ER via POV c/o near syncopal episode, "strange sensation" to head and "wave" of light that occurred at 1130AM today. Pt states hx of similar episodes that are shorter in duration X 2 weeks. Pt states that she has hard time finding words X 2 weeks intermittently. Pt able to speak clearly and in complete sentences today.  CBG checked PTA was 90.

## 2016-06-01 NOTE — ED Notes (Signed)
Patient transported to MRI 

## 2016-06-01 NOTE — ED Provider Notes (Signed)
[facility] Emergency Department Provider Note    First MD Initiated Contact with Patient 06/01/16 1524     (approximate)  I have reviewed the triage vital signs and the nursing notes.   HISTORY  Chief Complaint Near Syncope    HPI Lori Morales is a 56 y.o. female  Presents with near syncope.  patient states that she is having "an out of body experience ".  His episodes of very short duration States that she feels lightheaded and that she is about to pass out. Denies any numbness or tingling but feels that she gets about to lose her vision. Denies any chest pain or shortness of breath. States that she does have trouble finding words during these episodes. States that around 11:30 today she was having a conversation with her her friends when she had recurrence of her symptoms. States that the symptoms lasted 10-15 minutes and resolved spontaneously.  Went to sit down so she came to the ER to be checked out.  Past Medical History:  Diagnosis Date  . Arthritis   . Depression   . GERD (gastroesophageal reflux disease)   . Headache    migraines  . IBS (irritable bowel syndrome)   . Palpitations   . PONV (postoperative nausea and vomiting)   . Restless legs syndrome    Family History  Problem Relation Age of Onset  . CAD Mother   . Stroke Mother   . Heart attack Father   . Breast cancer Maternal Aunt 53   Past Surgical History:  Procedure Laterality Date  . ABDOMINOPLASTY    . BACK SURGERY     1 laminectomy, 1 fusion  . COLONOSCOPY    . WISDOM TOOTH EXTRACTION     Patient Active Problem List   Diagnosis Date Noted  . Cervical disc disorder with radiculopathy of cervical region 04/12/2016  . Nonallopathic lesion of thoracic region 04/12/2016  . Nonallopathic lesion of rib cage 04/12/2016      Prior to Admission medications   Medication Sig Start Date End Date Taking? Authorizing Provider  amitriptyline (ELAVIL) 10 MG tablet Take by mouth  every other day.  07/21/15 07/20/16  Historical Provider, MD  celecoxib (CELEBREX) 200 MG capsule Take 200 mg by mouth daily as needed for moderate pain.     Historical Provider, MD  HYDROcodone-acetaminophen (NORCO/VICODIN) 5-325 MG tablet Take 1-2 tablets by mouth every 6 (six) hours as needed for moderate pain. 10/22/15   Melynda Ripple, MD  MIMVEY LO 0.5-0.1 MG per tablet Take 1 tablet by mouth at bedtime.  10/22/14   Historical Provider, MD  pantoprazole (PROTONIX) 40 MG tablet Take 40 mg by mouth at bedtime.     Historical Provider, MD  pramipexole (MIRAPEX) 0.25 MG tablet Take 0.25 mg by mouth at bedtime.    Historical Provider, MD  zolpidem (AMBIEN CR) 6.25 MG CR tablet  05/07/15   Historical Provider, MD    Allergies Desvenlafaxine and Penicillins    Social History Social History  Substance Use Topics  . Smoking status: Never Smoker  . Smokeless tobacco: Never Used  . Alcohol use No    Review of Systems Patient denies headaches, rhinorrhea, blurry vision, numbness, shortness of breath, chest pain, edema, cough, abdominal pain, nausea, vomiting, diarrhea, dysuria, fevers, rashes or hallucinations unless otherwise stated above in HPI. ____________________________________________   PHYSICAL EXAM:  VITAL SIGNS: Vitals:   06/01/16 1311 06/01/16 1742  BP: (!) 167/88 (!) 153/92  Pulse: 72 74  Resp: 16 18  Temp: 97.7 F (36.5 C)     Constitutional: Alert and oriented. Well appearing and in no acute distress. Eyes: Conjunctivae are normal. PERRL. EOMI. Head: Atraumatic. Nose: No congestion/rhinnorhea. Mouth/Throat: Mucous membranes are moist.  Oropharynx non-erythematous. Neck: No stridor. Painless ROM. No cervical spine tenderness to palpation Hematological/Lymphatic/Immunilogical: No cervical lymphadenopathy. Cardiovascular: Normal rate, regular rhythm. Grossly normal heart sounds.  Good peripheral circulation. Respiratory: Normal respiratory effort.  No retractions.  Lungs CTAB. Gastrointestinal: Soft and nontender. No distention. No abdominal bruits. No CVA tenderness. Musculoskeletal: No lower extremity tenderness nor edema.  No joint effusions. Neurologic:  CN- intact.  No facial droop, Normal FNF.  Normal heel to shin.  Sensation intact bilaterally. Normal speech and language. No gross focal neurologic deficits are appreciated. No gait instability.  Skin:  Skin is warm, dry and intact. No rash noted. Psychiatric: Mood and affect are normal. Speech and behavior are normal.  ____________________________________________   LABS (all labs ordered are listed, but only abnormal results are displayed)  Results for orders placed or performed during the hospital encounter of 06/01/16 (from the past 24 hour(s))  Protime-INR     Status: None   Collection Time: 06/01/16  1:19 PM  Result Value Ref Range   Prothrombin Time 12.9 11.4 - 15.2 seconds   INR 0.97   APTT     Status: None   Collection Time: 06/01/16  1:19 PM  Result Value Ref Range   aPTT 33 24 - 36 seconds  CBC     Status: None   Collection Time: 06/01/16  1:19 PM  Result Value Ref Range   WBC 7.4 3.6 - 11.0 K/uL   RBC 4.92 3.80 - 5.20 MIL/uL   Hemoglobin 15.2 12.0 - 16.0 g/dL   HCT 45.6 35.0 - 47.0 %   MCV 92.6 80.0 - 100.0 fL   MCH 30.9 26.0 - 34.0 pg   MCHC 33.3 32.0 - 36.0 g/dL   RDW 13.5 11.5 - 14.5 %   Platelets 190 150 - 440 K/uL  Differential     Status: None   Collection Time: 06/01/16  1:19 PM  Result Value Ref Range   Neutrophils Relative % 74 %   Neutro Abs 5.5 1.4 - 6.5 K/uL   Lymphocytes Relative 14 %   Lymphs Abs 1.1 1.0 - 3.6 K/uL   Monocytes Relative 10 %   Monocytes Absolute 0.7 0.2 - 0.9 K/uL   Eosinophils Relative 1 %   Eosinophils Absolute 0.0 0 - 0.7 K/uL   Basophils Relative 1 %   Basophils Absolute 0.1 0 - 0.1 K/uL  Comprehensive metabolic panel     Status: Abnormal   Collection Time: 06/01/16  1:19 PM  Result Value Ref Range   Sodium 139 135 - 145 mmol/L    Potassium 3.7 3.5 - 5.1 mmol/L   Chloride 103 101 - 111 mmol/L   CO2 28 22 - 32 mmol/L   Glucose, Bld 123 (H) 65 - 99 mg/dL   BUN 11 6 - 20 mg/dL   Creatinine, Ser 0.79 0.44 - 1.00 mg/dL   Calcium 9.5 8.9 - 10.3 mg/dL   Total Protein 7.5 6.5 - 8.1 g/dL   Albumin 4.4 3.5 - 5.0 g/dL   AST 31 15 - 41 U/L   ALT 36 14 - 54 U/L   Alkaline Phosphatase 74 38 - 126 U/L   Total Bilirubin 0.6 0.3 - 1.2 mg/dL   GFR calc non Af Amer >60 >60 mL/min  GFR calc Af Amer >60 >60 mL/min   Anion gap 8 5 - 15  Troponin I     Status: None   Collection Time: 06/01/16  1:19 PM  Result Value Ref Range   Troponin I <0.03 <0.03 ng/mL  Troponin I     Status: None   Collection Time: 06/01/16  4:23 PM  Result Value Ref Range   Troponin I <0.03 <0.03 ng/mL   ____________________________________________  EKG My review and personal interpretation at Time: 13:23   Indication: near syncope  Rate: 70  Rhythm: sinuis Axis: normakl Other: normal ekg, no acute ischemia, normal intervals ____________________________________________  RADIOLOGY  I personally reviewed all radiographic images ordered to evaluate for the above acute complaints and reviewed radiology reports and findings.  These findings were personally discussed with the patient.  Please see medical record for radiology report.  ____________________________________________   PROCEDURES  Procedure(s) performed:  Procedures    Critical Care performed: no ____________________________________________   INITIAL IMPRESSION / ASSESSMENT AND PLAN / ED COURSE  Pertinent labs & imaging results that were available during my care of the patient were reviewed by me and considered in my medical decision making (see chart for details).  DDX: tia, orthostasis, dehydration, seizure, electrolye abn, acs, dysrhythmia, anxiety  Lori Morales is a 56 y.o. who presents to the ED with Brief near syncopal episode as described above. Patient arrives afebrile  and hemodynamically stable. Her neurologic exam is nonfocal. Very bizarre presentation. CT imaging ordered at triage due to concern for CVA versus TIA shows no acute intracranial abnormality. Her blood work is otherwise reassuring. EKG shows no evidence of acute ischemia or dysrhythmia. Differential at this time is broad. Does not seem to be describing any true vascular TIA pathology but certainly a possibility. No evidence of acute infectious process. Patient is otherwise well-appearing at this time.  Based on her description of the events and concern for TIA versus other acute central nervous system abnormality we'll order MRI to evaluate for acute abnormality. Less consistent with cardiac etiology but will delta troponin to further risk stratify.  The patient will be placed on continuous pulse oximetry and telemetry for monitoring.  Laboratory evaluation will be sent to evaluate for the above complaints.     Clinical Course as of Jun 01 1914  Wed Jun 01, 2016  1737 Patient reassessed. Remains asymptomatic and hemodynamic stable the duration of 4 hours here in the ER. Her MRI shows microvascular changes but no evidence of acute or subacute infarct. A spoke with Dr. Leonel Ramsay of neurology regarding the patient's presentation and he agrees that this is not clinically consistent with a typical TIA.  He did bring up the possibility of seizure. I recommended observation in the hospital for EEG in the morning and neurology evaluation however the patient has declined this. I also offered Tylenol neurology evaluation the patient states that she feels well at this point is requesting discharge home. As she does have very atypical presentation with reassuring workup thus far I do think that this is a reasonable option. I will provide referral for neurology.  Patient was able to tolerate PO and was able to ambulate with a steady gait.  Have discussed with the patient and available family all diagnostics and treatments  performed thus far and all questions were answered to the best of my ability. The patient demonstrates understanding and agreement with plan.   [PR]    Clinical Course User Index [PR] Merlyn Lot, MD  ____________________________________________   FINAL CLINICAL IMPRESSION(S) / ED DIAGNOSES  Final diagnoses:  Near syncope      NEW MEDICATIONS STARTED DURING THIS VISIT:  Discharge Medication List as of 06/01/2016  5:40 PM       Note:  This document was prepared using Dragon voice recognition software and may include unintentional dictation errors.    Merlyn Lot, MD 06/01/16 [id number]

## 2016-06-02 DIAGNOSIS — I10 Essential (primary) hypertension: Secondary | ICD-10-CM | POA: Diagnosis not present

## 2016-06-02 DIAGNOSIS — M4692 Unspecified inflammatory spondylopathy, cervical region: Secondary | ICD-10-CM | POA: Diagnosis not present

## 2016-06-02 DIAGNOSIS — M1990 Unspecified osteoarthritis, unspecified site: Secondary | ICD-10-CM | POA: Diagnosis not present

## 2016-06-02 DIAGNOSIS — R42 Dizziness and giddiness: Secondary | ICD-10-CM | POA: Diagnosis not present

## 2016-06-08 DIAGNOSIS — I10 Essential (primary) hypertension: Secondary | ICD-10-CM | POA: Diagnosis not present

## 2016-06-08 DIAGNOSIS — R7989 Other specified abnormal findings of blood chemistry: Secondary | ICD-10-CM | POA: Diagnosis not present

## 2016-06-14 DIAGNOSIS — R42 Dizziness and giddiness: Secondary | ICD-10-CM | POA: Diagnosis not present

## 2016-06-14 DIAGNOSIS — Z8669 Personal history of other diseases of the nervous system and sense organs: Secondary | ICD-10-CM | POA: Diagnosis not present

## 2016-06-17 ENCOUNTER — Encounter (INDEPENDENT_AMBULATORY_CARE_PROVIDER_SITE_OTHER): Payer: 59

## 2016-06-20 DIAGNOSIS — R1012 Left upper quadrant pain: Secondary | ICD-10-CM | POA: Diagnosis not present

## 2016-06-20 DIAGNOSIS — R1013 Epigastric pain: Secondary | ICD-10-CM | POA: Diagnosis not present

## 2016-07-11 DIAGNOSIS — Z8589 Personal history of malignant neoplasm of other organs and systems: Secondary | ICD-10-CM

## 2016-07-11 DIAGNOSIS — C44622 Squamous cell carcinoma of skin of right upper limb, including shoulder: Secondary | ICD-10-CM | POA: Diagnosis not present

## 2016-07-11 DIAGNOSIS — L821 Other seborrheic keratosis: Secondary | ICD-10-CM | POA: Diagnosis not present

## 2016-07-11 DIAGNOSIS — L99 Other disorders of skin and subcutaneous tissue in diseases classified elsewhere: Secondary | ICD-10-CM | POA: Diagnosis not present

## 2016-07-11 DIAGNOSIS — Z808 Family history of malignant neoplasm of other organs or systems: Secondary | ICD-10-CM | POA: Diagnosis not present

## 2016-07-11 DIAGNOSIS — L820 Inflamed seborrheic keratosis: Secondary | ICD-10-CM | POA: Diagnosis not present

## 2016-07-11 HISTORY — DX: Personal history of malignant neoplasm of other organs and systems: Z85.89

## 2016-07-25 DIAGNOSIS — N951 Menopausal and female climacteric states: Secondary | ICD-10-CM | POA: Diagnosis not present

## 2016-07-25 DIAGNOSIS — B373 Candidiasis of vulva and vagina: Secondary | ICD-10-CM | POA: Diagnosis not present

## 2016-07-25 DIAGNOSIS — N950 Postmenopausal bleeding: Secondary | ICD-10-CM | POA: Diagnosis not present

## 2016-07-25 DIAGNOSIS — Z1231 Encounter for screening mammogram for malignant neoplasm of breast: Secondary | ICD-10-CM | POA: Diagnosis not present

## 2016-07-25 DIAGNOSIS — Z01419 Encounter for gynecological examination (general) (routine) without abnormal findings: Secondary | ICD-10-CM | POA: Diagnosis not present

## 2016-07-26 DIAGNOSIS — Z791 Long term (current) use of non-steroidal anti-inflammatories (NSAID): Secondary | ICD-10-CM | POA: Diagnosis not present

## 2016-07-26 DIAGNOSIS — E784 Other hyperlipidemia: Secondary | ICD-10-CM | POA: Diagnosis not present

## 2016-07-28 DIAGNOSIS — N950 Postmenopausal bleeding: Secondary | ICD-10-CM | POA: Diagnosis not present

## 2016-08-02 DIAGNOSIS — M4692 Unspecified inflammatory spondylopathy, cervical region: Secondary | ICD-10-CM | POA: Diagnosis not present

## 2016-08-02 DIAGNOSIS — Z87898 Personal history of other specified conditions: Secondary | ICD-10-CM | POA: Insufficient documentation

## 2016-08-02 DIAGNOSIS — Z0001 Encounter for general adult medical examination with abnormal findings: Secondary | ICD-10-CM | POA: Diagnosis not present

## 2016-08-02 DIAGNOSIS — Z23 Encounter for immunization: Secondary | ICD-10-CM | POA: Diagnosis not present

## 2016-08-02 DIAGNOSIS — F3289 Other specified depressive episodes: Secondary | ICD-10-CM | POA: Diagnosis not present

## 2016-08-02 DIAGNOSIS — E784 Other hyperlipidemia: Secondary | ICD-10-CM | POA: Diagnosis not present

## 2016-08-29 DIAGNOSIS — D72818 Other decreased white blood cell count: Secondary | ICD-10-CM | POA: Diagnosis not present

## 2016-09-14 DIAGNOSIS — Z1211 Encounter for screening for malignant neoplasm of colon: Secondary | ICD-10-CM | POA: Diagnosis not present

## 2016-09-14 DIAGNOSIS — Z8371 Family history of colonic polyps: Secondary | ICD-10-CM | POA: Diagnosis not present

## 2016-10-12 DIAGNOSIS — H5213 Myopia, bilateral: Secondary | ICD-10-CM | POA: Diagnosis not present

## 2017-01-17 ENCOUNTER — Ambulatory Visit: Payer: 59 | Admitting: Anesthesiology

## 2017-01-17 ENCOUNTER — Ambulatory Visit
Admission: RE | Admit: 2017-01-17 | Discharge: 2017-01-17 | Disposition: A | Discharge status: Home / Self Care | Payer: 59 | Source: Ambulatory Visit | Attending: Gastroenterology | Admitting: Gastroenterology

## 2017-01-17 ENCOUNTER — Encounter
Admission: RE | Disposition: A | Discharge status: Home / Self Care | Payer: Self-pay | Source: Ambulatory Visit | Attending: Gastroenterology

## 2017-01-17 ENCOUNTER — Encounter: Payer: Self-pay | Admitting: *Deleted

## 2017-01-17 DIAGNOSIS — Z1211 Encounter for screening for malignant neoplasm of colon: Secondary | ICD-10-CM | POA: Diagnosis not present

## 2017-01-17 DIAGNOSIS — R51 Headache: Secondary | ICD-10-CM | POA: Insufficient documentation

## 2017-01-17 DIAGNOSIS — Z800 Family history of malignant neoplasm of digestive organs: Secondary | ICD-10-CM | POA: Insufficient documentation

## 2017-01-17 DIAGNOSIS — Z7982 Long term (current) use of aspirin: Secondary | ICD-10-CM | POA: Diagnosis not present

## 2017-01-17 DIAGNOSIS — Z79899 Other long term (current) drug therapy: Secondary | ICD-10-CM | POA: Diagnosis not present

## 2017-01-17 DIAGNOSIS — F329 Major depressive disorder, single episode, unspecified: Secondary | ICD-10-CM | POA: Diagnosis not present

## 2017-01-17 DIAGNOSIS — I10 Essential (primary) hypertension: Secondary | ICD-10-CM | POA: Insufficient documentation

## 2017-01-17 DIAGNOSIS — M1990 Unspecified osteoarthritis, unspecified site: Secondary | ICD-10-CM | POA: Diagnosis not present

## 2017-01-17 DIAGNOSIS — K589 Irritable bowel syndrome without diarrhea: Secondary | ICD-10-CM | POA: Diagnosis not present

## 2017-01-17 DIAGNOSIS — K5730 Diverticulosis of large intestine without perforation or abscess without bleeding: Secondary | ICD-10-CM | POA: Insufficient documentation

## 2017-01-17 DIAGNOSIS — K635 Polyp of colon: Secondary | ICD-10-CM | POA: Diagnosis not present

## 2017-01-17 DIAGNOSIS — K219 Gastro-esophageal reflux disease without esophagitis: Secondary | ICD-10-CM | POA: Insufficient documentation

## 2017-01-17 DIAGNOSIS — G2581 Restless legs syndrome: Secondary | ICD-10-CM | POA: Insufficient documentation

## 2017-01-17 DIAGNOSIS — Z8371 Family history of colonic polyps: Secondary | ICD-10-CM | POA: Insufficient documentation

## 2017-01-17 DIAGNOSIS — Z880 Allergy status to penicillin: Secondary | ICD-10-CM | POA: Diagnosis not present

## 2017-01-17 DIAGNOSIS — K648 Other hemorrhoids: Secondary | ICD-10-CM | POA: Diagnosis not present

## 2017-01-17 DIAGNOSIS — D123 Benign neoplasm of transverse colon: Secondary | ICD-10-CM | POA: Diagnosis not present

## 2017-01-17 HISTORY — PX: COLONOSCOPY WITH PROPOFOL: SHX5780

## 2017-01-17 HISTORY — DX: Essential (primary) hypertension: I10

## 2017-01-17 SURGERY — COLONOSCOPY WITH PROPOFOL
Anesthesia: General

## 2017-01-17 MED ORDER — FENTANYL CITRATE (PF) 100 MCG/2ML IJ SOLN
INTRAMUSCULAR | Status: AC
  Filled 2017-01-17: qty 2

## 2017-01-17 MED ORDER — SODIUM CHLORIDE 0.9 % IV SOLN
INTRAVENOUS | Status: DC
  Administered 2017-01-17: 08:00:00 via INTRAVENOUS
  Administered 2017-01-17: 1000 mL via INTRAVENOUS

## 2017-01-17 MED ORDER — PROPOFOL 500 MG/50ML IV EMUL
INTRAVENOUS | Status: AC
  Filled 2017-01-17: qty 50

## 2017-01-17 MED ORDER — LIDOCAINE 2% (20 MG/ML) 5 ML SYRINGE
INTRAMUSCULAR | Status: DC | PRN
  Administered 2017-01-17: 40 mg via INTRAVENOUS

## 2017-01-17 MED ORDER — PHENYLEPHRINE HCL 10 MG/ML IJ SOLN
INTRAMUSCULAR | Status: DC | PRN
  Administered 2017-01-17 (×2): 100 ug via INTRAVENOUS

## 2017-01-17 MED ORDER — SODIUM CHLORIDE 0.9 % IV SOLN: INTRAVENOUS | Status: DC

## 2017-01-17 MED ORDER — MIDAZOLAM HCL 2 MG/2ML IJ SOLN
INTRAMUSCULAR | Status: AC
  Filled 2017-01-17: qty 2

## 2017-01-17 MED ORDER — FENTANYL CITRATE (PF) 100 MCG/2ML IJ SOLN
INTRAMUSCULAR | Status: DC | PRN
  Administered 2017-01-17 (×2): 50 ug via INTRAVENOUS

## 2017-01-17 MED ORDER — PROPOFOL 10 MG/ML IV BOLUS
INTRAVENOUS | Status: DC | PRN
  Administered 2017-01-17: 100 mg via INTRAVENOUS

## 2017-01-17 MED ORDER — PROPOFOL 500 MG/50ML IV EMUL
INTRAVENOUS | Status: DC | PRN
  Administered 2017-01-17: 140 ug/kg/min via INTRAVENOUS

## 2017-01-17 NOTE — Transfer of Care (Signed)
Immediate Anesthesia Transfer of Care Note  Patient: Lori Morales  Procedure(s) Performed: Procedure(s): COLONOSCOPY WITH PROPOFOL (N/A)  Patient Location: PACU and Endoscopy Unit  Anesthesia Type:General  Level of Consciousness: sedated  Airway & Oxygen Therapy: Patient Spontanous Breathing and Patient connected to nasal cannula oxygen  Post-op Assessment: Report given to RN and Post -op Vital signs reviewed and stable  Post vital signs: Reviewed and stable  Last Vitals: There were no vitals filed for this visit.  Last Pain: There were no vitals filed for this visit.       Complications: No apparent anesthesia complications

## 2017-01-17 NOTE — H&P (Signed)
Outpatient short stay form Pre-procedure 01/17/2017 8:26 AM Lollie Sails MD  Primary Physician: Dr. Ramonita Lab  Reason for visit:  Colonoscopy  History of present illness:  Patient is a 56 year old female presenting today as above. Her last colonoscopy was about 5 years ago. There are no polyps at that time. She does have a strong family history of colon polyps in multiple relatives, first and second degree. She tolerated her prep well. She takes 81 mg aspirin that has been held for several days. She takes no other aspirin or blood thinning agent.    Current Facility-Administered Medications:  .  0.9 %  sodium chloride infusion, , Intravenous, Continuous, Lollie Sails, MD, Last Rate: 20 mL/hr at 01/17/17 0819 .  0.9 %  sodium chloride infusion, , Intravenous, Continuous, Lollie Sails, MD  Prescriptions Prior to Admission  Medication Sig Dispense Refill Last Dose  . aspirin EC 81 MG tablet Take 81 mg by mouth daily.     . cholecalciferol (VITAMIN D) 1000 units tablet Take 1,000 Units by mouth daily.     . metoprolol succinate (TOPROL-XL) 25 MG 24 hr tablet Take 25 mg by mouth daily.   01/16/2017 at 2100  . amitriptyline (ELAVIL) 10 MG tablet Take by mouth every other day.    Taking  . celecoxib (CELEBREX) 200 MG capsule Take 200 mg by mouth daily as needed for moderate pain.    Taking  . HYDROcodone-acetaminophen (NORCO/VICODIN) 5-325 MG tablet Take 1-2 tablets by mouth every 6 (six) hours as needed for moderate pain. 24 tablet 0 Taking  . MIMVEY LO 0.5-0.1 MG per tablet Take 1 tablet by mouth at bedtime.   4 Taking  . pantoprazole (PROTONIX) 40 MG tablet Take 40 mg by mouth at bedtime.    Taking  . pramipexole (MIRAPEX) 0.25 MG tablet Take 0.25 mg by mouth at bedtime.   Taking  . zolpidem (AMBIEN CR) 6.25 MG CR tablet    Taking     Allergies  Allergen Reactions  . Desvenlafaxine Swelling and Other (See Comments)    Headaches (reaction to Pristiq)  . Penicillins Hives  and Rash     Past Medical History:  Diagnosis Date  . Arthritis   . Depression   . GERD (gastroesophageal reflux disease)   . Headache    migraines  . Hypertension   . IBS (irritable bowel syndrome)   . Palpitations   . PONV (postoperative nausea and vomiting)   . Restless legs syndrome     Review of systems:      Physical Exam    Heart and lungs: Regular rate and rhythm without rub or gallop lungs are bilaterally clear    HEENT: Normocephalic atraumatic eyes are anicteric    Other:     Pertinant exam for procedure: Soft nontender nondistended bowel sounds positive and normoactive.    Planned proceedures: Colonoscopy and indicated procedures. I have discussed the risks benefits and complications of procedures to include not limited to bleeding, infection, perforation and the risk of sedation and the patient wishes to proceed.    Lollie Sails, MD Gastroenterology 01/17/2017  8:26 AM

## 2017-01-17 NOTE — Anesthesia Postprocedure Evaluation (Signed)
Anesthesia Post Note  Patient: Lori Morales  Procedure(s) Performed: Procedure(s) (LRB): COLONOSCOPY WITH PROPOFOL (N/A)  Patient location during evaluation: Endoscopy Anesthesia Type: General Level of consciousness: awake and alert Pain management: pain level controlled Vital Signs Assessment: post-procedure vital signs reviewed and stable Respiratory status: spontaneous breathing and respiratory function stable Cardiovascular status: stable Anesthetic complications: no     Last Vitals:  Vitals:   01/17/17 0920 01/17/17 0930  BP: 111/73 115/78  Pulse: (!) 54 (!) 57  Resp: 12 12  Temp:    SpO2: 100% 100%    Last Pain:  Vitals:   01/17/17 0900  TempSrc: Tympanic                 Renate Danh K

## 2017-01-17 NOTE — Op Note (Signed)
[facility] Gastroenterology Patient Name: Lori Morales Procedure Date: 01/17/2017 8:31 AM MRN: [id number] Account #: [id number] Date of Birth: [birth year] Admit Type: Outpatient Age: 56 Room: [facility] ENDO ROOM 3 Gender: Female Note Status: Finalized Procedure:            Colonoscopy Indications:          Colon cancer screening in patient at increased risk:                        Family history of 1st-degree relative with colon                        polyps, Colon cancer screening in patient at increased                        risk: Family history of colon polyps in multiple                        1st-degree relatives Providers:            Lollie Sails, MD Referring MD:         Ramonita Lab, MD (Referring MD) Medicines:            Monitored Anesthesia Care Complications:        No immediate complications. Procedure:            Pre-Anesthesia Assessment:                       - ASA Grade Assessment: II - A patient with mild                        systemic disease.                       After obtaining informed consent, the colonoscope was                        passed under direct vision. Throughout the procedure,                        the patient's blood pressure, pulse, and oxygen                        saturations were monitored continuously. The                        Colonoscope was introduced through the anus and                        advanced to the the cecum, identified by appendiceal                        orifice and ileocecal valve. The colonoscopy was                        performed without difficulty. The patient tolerated the                        procedure well. The quality of the bowel preparation  was good. Findings:      A 2 mm polyp was found in the hepatic flexure. The polyp was sessile.       The polyp was removed with a cold biopsy forceps. Resection and       retrieval were complete.      A few small-mouthed  diverticula were found in the sigmoid colon,       descending colon and distal descending colon.      The digital rectal exam was normal. Impression:           - One 2 mm polyp at the hepatic flexure, removed with a                        cold biopsy forceps. Resected and retrieved.                       - Diverticulosis in the sigmoid colon, in the                        descending colon and in the distal descending colon. Recommendation:       - Discharge patient to home.                       - Await pathology results.                       - Telephone GI clinic for pathology results in 1 week. Procedure Code(s):    --- Professional ---                       [phone], Colonoscopy, flexible; with biopsy, single or                        multiple Diagnosis Code(s):    --- Professional ---                       D12.3, Benign neoplasm of transverse colon (hepatic                        flexure or splenic flexure)                       Z83.71, Family history of colonic polyps                       K57.30, Diverticulosis of large intestine without                        perforation or abscess without bleeding CPT copyright 2016 [employer]. All rights reserved. The codes documented in this report are preliminary and upon coder review may  be revised to meet current compliance requirements. Lollie Sails, MD 01/17/2017 9:02:53 AM This report has been signed electronically. Number of Addenda: 0 Note Initiated On: 01/17/2017 8:31 AM Scope Withdrawal Time: 0 hours 9 minutes 43 seconds  Total Procedure Duration: 0 hours 21 minutes 1 second       [facility]

## 2017-01-17 NOTE — Anesthesia Post-op Follow-up Note (Signed)
Anesthesia QCDR form completed.        

## 2017-01-17 NOTE — Anesthesia Preprocedure Evaluation (Signed)
Anesthesia Evaluation  Patient identified by MRN, date of birth, ID band Patient awake    Reviewed: Allergy & Precautions, NPO status , Patient's Chart, lab work & pertinent test results  History of Anesthesia Complications (+) PONV and history of anesthetic complications  Airway Mallampati: I       Dental   Pulmonary           Cardiovascular hypertension, Pt. on medications and Pt. on home beta blockers (-) CAD, (-) Past MI and (-) CHF (-) dysrhythmias (-) Valvular Problems/Murmurs     Neuro/Psych Depression    GI/Hepatic Neg liver ROS, GERD  Medicated and Poorly Controlled,  Endo/Other  neg diabetes  Renal/GU negative Renal ROS     Musculoskeletal   Abdominal   Peds  Hematology   Anesthesia Other Findings   Reproductive/Obstetrics                             Anesthesia Physical Anesthesia Plan  ASA: II  Anesthesia Plan: General   Post-op Pain Management:    Induction: Intravenous  PONV Risk Score and Plan: Propofol infusion  Airway Management Planned:   Additional Equipment:   Intra-op Plan:   Post-operative Plan:   Informed Consent: I have reviewed the patients History and Physical, chart, labs and discussed the procedure including the risks, benefits and alternatives for the proposed anesthesia with the patient or authorized representative who has indicated his/her understanding and acceptance.     Plan Discussed with:   Anesthesia Plan Comments:         Anesthesia Quick Evaluation

## 2017-01-19 ENCOUNTER — Encounter: Payer: Self-pay | Admitting: Gastroenterology

## 2017-01-19 LAB — SURGICAL PATHOLOGY

## 2017-01-24 DIAGNOSIS — E784 Other hyperlipidemia: Secondary | ICD-10-CM | POA: Diagnosis not present

## 2017-01-24 DIAGNOSIS — D72818 Other decreased white blood cell count: Secondary | ICD-10-CM | POA: Diagnosis not present

## 2017-02-10 DIAGNOSIS — M519 Unspecified thoracic, thoracolumbar and lumbosacral intervertebral disc disorder: Secondary | ICD-10-CM | POA: Diagnosis not present

## 2017-02-10 DIAGNOSIS — E784 Other hyperlipidemia: Secondary | ICD-10-CM | POA: Diagnosis not present

## 2017-02-10 DIAGNOSIS — F3289 Other specified depressive episodes: Secondary | ICD-10-CM | POA: Diagnosis not present

## 2017-02-10 DIAGNOSIS — M4692 Unspecified inflammatory spondylopathy, cervical region: Secondary | ICD-10-CM | POA: Diagnosis not present

## 2017-03-29 ENCOUNTER — Other Ambulatory Visit: Payer: Self-pay | Admitting: Internal Medicine

## 2017-03-31 ENCOUNTER — Other Ambulatory Visit: Payer: Self-pay | Admitting: Obstetrics and Gynecology

## 2017-03-31 ENCOUNTER — Other Ambulatory Visit: Payer: Self-pay | Admitting: Internal Medicine

## 2017-03-31 DIAGNOSIS — Z1231 Encounter for screening mammogram for malignant neoplasm of breast: Secondary | ICD-10-CM

## 2017-03-31 DIAGNOSIS — M5090 Cervical disc disorder, unspecified, unspecified cervical region: Secondary | ICD-10-CM

## 2017-04-05 DIAGNOSIS — D1801 Hemangioma of skin and subcutaneous tissue: Secondary | ICD-10-CM | POA: Diagnosis not present

## 2017-04-05 DIAGNOSIS — D229 Melanocytic nevi, unspecified: Secondary | ICD-10-CM | POA: Diagnosis not present

## 2017-04-05 DIAGNOSIS — Z808 Family history of malignant neoplasm of other organs or systems: Secondary | ICD-10-CM | POA: Diagnosis not present

## 2017-04-05 DIAGNOSIS — L578 Other skin changes due to chronic exposure to nonionizing radiation: Secondary | ICD-10-CM | POA: Diagnosis not present

## 2017-04-05 DIAGNOSIS — L812 Freckles: Secondary | ICD-10-CM | POA: Diagnosis not present

## 2017-04-05 DIAGNOSIS — Z85828 Personal history of other malignant neoplasm of skin: Secondary | ICD-10-CM | POA: Diagnosis not present

## 2017-04-05 DIAGNOSIS — L821 Other seborrheic keratosis: Secondary | ICD-10-CM | POA: Diagnosis not present

## 2017-04-05 DIAGNOSIS — D225 Melanocytic nevi of trunk: Secondary | ICD-10-CM | POA: Diagnosis not present

## 2017-04-05 DIAGNOSIS — Z1283 Encounter for screening for malignant neoplasm of skin: Secondary | ICD-10-CM | POA: Diagnosis not present

## 2017-04-10 ENCOUNTER — Ambulatory Visit
Admission: RE | Admit: 2017-04-10 | Discharge: 2017-04-10 | Disposition: A | Discharge status: Home / Self Care | Payer: 59 | Source: Ambulatory Visit | Attending: Obstetrics and Gynecology | Admitting: Obstetrics and Gynecology

## 2017-04-10 DIAGNOSIS — Z1231 Encounter for screening mammogram for malignant neoplasm of breast: Secondary | ICD-10-CM | POA: Insufficient documentation

## 2017-04-11 ENCOUNTER — Ambulatory Visit
Admission: RE | Admit: 2017-04-11 | Discharge: 2017-04-11 | Disposition: A | Discharge status: Home / Self Care | Payer: 59 | Source: Ambulatory Visit | Attending: Internal Medicine | Admitting: Internal Medicine

## 2017-04-11 DIAGNOSIS — M5090 Cervical disc disorder, unspecified, unspecified cervical region: Secondary | ICD-10-CM

## 2017-04-11 DIAGNOSIS — M5000 Cervical disc disorder with myelopathy, unspecified cervical region: Secondary | ICD-10-CM | POA: Diagnosis not present

## 2017-04-11 MED ORDER — IOPAMIDOL (ISOVUE-M 300) INJECTION 61%
1.0000 mL | Freq: Once | INTRAMUSCULAR | Status: AC | PRN
  Administered 2017-04-11: 1 mL via EPIDURAL

## 2017-04-11 MED ORDER — METHYLPREDNISOLONE ACETATE 40 MG/ML INJ SUSP (RADIOLOG
120.0000 mg | Freq: Once | INTRAMUSCULAR | Status: AC
  Administered 2017-04-11: 120 mg via EPIDURAL

## 2017-04-11 NOTE — Discharge Instructions (Signed)

## 2017-07-10 DIAGNOSIS — R739 Hyperglycemia, unspecified: Secondary | ICD-10-CM | POA: Insufficient documentation

## 2017-07-11 DIAGNOSIS — R739 Hyperglycemia, unspecified: Secondary | ICD-10-CM | POA: Diagnosis not present

## 2017-07-11 DIAGNOSIS — M4692 Unspecified inflammatory spondylopathy, cervical region: Secondary | ICD-10-CM | POA: Diagnosis not present

## 2017-07-11 DIAGNOSIS — M1990 Unspecified osteoarthritis, unspecified site: Secondary | ICD-10-CM | POA: Diagnosis not present

## 2017-07-23 ENCOUNTER — Encounter: Payer: Self-pay | Admitting: Gynecology

## 2017-07-23 ENCOUNTER — Ambulatory Visit
Admission: EM | Admit: 2017-07-23 | Discharge: 2017-07-23 | Disposition: A | Discharge status: Home / Self Care | Payer: 59 | Attending: Family Medicine | Admitting: Family Medicine

## 2017-07-23 ENCOUNTER — Other Ambulatory Visit: Payer: Self-pay

## 2017-07-23 DIAGNOSIS — J018 Other acute sinusitis: Secondary | ICD-10-CM

## 2017-07-23 DIAGNOSIS — J069 Acute upper respiratory infection, unspecified: Secondary | ICD-10-CM | POA: Diagnosis not present

## 2017-07-23 LAB — RAPID STREP SCREEN (MED CTR MEBANE ONLY): Streptococcus, Group A Screen (Direct): NEGATIVE

## 2017-07-23 MED ORDER — AZITHROMYCIN 250 MG PO TABS: ORAL_TABLET | ORAL | 0 refills | Status: DC

## 2017-07-23 MED ORDER — HYDROCODONE-HOMATROPINE 5-1.5 MG/5ML PO SYRP: 5.0000 mL | ORAL_SOLUTION | Freq: Four times a day (QID) | ORAL | 0 refills | Status: DC | PRN

## 2017-07-23 NOTE — ED Provider Notes (Signed)
[facility]    CSN: [id number] Arrival date & time: 07/23/17  0841  History   Chief Complaint Chief Complaint  Patient presents with  . Cough    HPI Lori Morales is a 57 y.o. female who presents today for a one-week history of facial pressure, sore throat and productive cough.  Patient denies any interaction with people positive for the flu at this time.  Patient reports a pressure-like sensation without vision changes.  Constant cough throughout the day, productive cough worsening at night.  Patient is non-smoker.  No history of recurrent sinusitis, no history of COPD.  Patient reports mild fevers at home.  No hemoptysis.  No shortness of breath.  Patient does report a penicillin allergy.  HPI  Past Medical History:  Diagnosis Date  . Arthritis   . Depression   . GERD (gastroesophageal reflux disease)   . Headache    migraines  . Hypertension   . IBS (irritable bowel syndrome)   . Palpitations   . PONV (postoperative nausea and vomiting)   . Restless legs syndrome     Patient Active Problem List   Diagnosis Date Noted  . Cervical disc disorder with radiculopathy of cervical region 04/12/2016  . Nonallopathic lesion of thoracic region 04/12/2016  . Nonallopathic lesion of rib cage 04/12/2016    Past Surgical History:  Procedure Laterality Date  . ABDOMINOPLASTY    . BACK SURGERY     1 laminectomy, 1 fusion  . COLONOSCOPY    . COLONOSCOPY WITH PROPOFOL N/A 01/17/2017   Procedure: COLONOSCOPY WITH PROPOFOL;  Surgeon: Lollie Sails, MD;  Location: [facility] ENDOSCOPY;  Service: Endoscopy;  Laterality: N/A;  . WISDOM TOOTH EXTRACTION      OB History    No data available       Home Medications    Prior to Admission medications   Medication Sig Start Date End Date Taking? Authorizing Provider  aspirin EC 81 MG tablet Take 81 mg by mouth daily.   Yes [provider]  cholecalciferol (VITAMIN D) 1000 units tablet Take 1,000 Units by mouth  daily.   Yes [provider]  metoprolol succinate (TOPROL-XL) 25 MG 24 hr tablet Take 25 mg by mouth daily.   Yes [provider]  MIMVEY LO 0.5-0.1 MG per tablet Take 1 tablet by mouth at bedtime.  10/22/14  Yes [provider]  pantoprazole (PROTONIX) 40 MG tablet Take 40 mg by mouth at bedtime.    Yes [provider]  pramipexole (MIRAPEX) 0.25 MG tablet Take 0.25 mg by mouth at bedtime.   Yes [provider]  zolpidem (AMBIEN CR) 6.25 MG CR tablet  05/07/15  Yes [provider]  amitriptyline (ELAVIL) 10 MG tablet Take by mouth every other day.  07/21/15 07/20/16  [provider]  azithromycin (ZITHROMAX Z-PAK) 250 MG tablet Take 2 tabs on day 1 and 1 tablet days 2-5 07/23/17   Lattie Corns, PA-C  celecoxib (CELEBREX) 200 MG capsule Take 200 mg by mouth daily as needed for moderate pain.     [provider]  HYDROcodone-acetaminophen (NORCO/VICODIN) 5-325 MG tablet Take 1-2 tablets by mouth every 6 (six) hours as needed for moderate pain. 10/22/15   Melynda Ripple, MD  HYDROcodone-homatropine [facility]) 5-1.5 MG/5ML syrup Take 5 mLs by mouth every 6 (six) hours as needed for cough. 07/23/17   Lattie Corns, PA-C    Family History Family History  Problem Relation Age of Onset  .  CAD Mother   . Stroke Mother   . Heart attack Father   . Breast cancer Maternal Aunt 53    Social History Social History   Tobacco Use  . Smoking status: Never Smoker  . Smokeless tobacco: Never Used  Substance Use Topics  . Alcohol use: No  . Drug use: No     Allergies   Penicillins and Pristiq [desvenlafaxine]   Review of Systems Review of Systems  Constitutional: Positive for chills and fever.  HENT: Positive for congestion, sinus pressure and sinus pain.   Respiratory: Positive for cough.   Gastrointestinal: Negative.   Endocrine: Negative.   Genitourinary: Negative.   Musculoskeletal: Negative.     Allergic/Immunologic: Negative.   Neurological: Negative.   Hematological: Negative.   Psychiatric/Behavioral: Negative.     Physical Exam Triage Vital Signs ED Triage Vitals  Enc Vitals Group     BP 07/23/17 0856 (!) 155/84     Pulse Rate 07/23/17 0856 95     Resp 07/23/17 0856 16     Temp 07/23/17 0856 98.5 F (36.9 C)     Temp src --      SpO2 07/23/17 0856 99 %     Weight 07/23/17 0857 169 lb (76.7 kg)     Height 07/23/17 0857 5\' 5"  (1.651 m)     Head Circumference --      Peak Flow --      Pain Score 07/23/17 0857 2     Pain Loc --      Pain Edu? --      Excl. in [place]? --    No data found.  Updated Vital Signs BP (!) 155/84 (BP Location: Left Arm)   Pulse 95   Temp 98.5 F (36.9 C)   Resp 16   Ht 5\' 5"  (1.651 m)   Wt 169 lb (76.7 kg)   LMP 02/13/2012   SpO2 99%   BMI 28.12 kg/m   Visual Acuity Right Eye Distance:   Left Eye Distance:   Bilateral Distance:    Right Eye Near:   Left Eye Near:    Bilateral Near:     Physical Exam  Constitutional: She appears well-developed and well-nourished.  HENT:  Bilateral nasal canals erythematous. Oropharynx erythematous without purulent discharge. Tympanic membranes showed signs of increased pressure however no signs of infection. Patient is tender to palpation over the frontal sinus.  Neck:  No lymphadenopathy  Cardiovascular: Normal rate, regular rhythm, S1 normal, S2 normal and normal heart sounds.  Pulmonary/Chest:  Mild wheeze to bilateral upper lobes, lower lobes normal to auscultation without wheeze rhonchi or rales.   UC Treatments / Results  Labs (all labs ordered are listed, but only abnormal results are displayed) Labs Reviewed  RAPID STREP SCREEN (NOT AT [facility])  CULTURE, GROUP A STREP [facility])    EKG  EKG Interpretation None       Radiology No results found.  Procedures Procedures (including critical care time)  Medications Ordered in UC Medications - No data to  display   Initial Impression / Assessment and Plan / UC Course  I have reviewed the triage vital signs and the nursing notes.  Pertinent labs & imaging results that were available during my care of the patient were reviewed by me and considered in my medical decision making (see chart for details).    1.  Treatment options were discussed today with the patient. 2.  Patient demonstrates signs of a bacterial sinusitis with symptoms ongoing for  1 week.  The patient has a penicillin allergy so she was prescribed a Z-Pak.  Rapid strep negative today. 3.  Hycodan for cough symptoms at night.  Encouraged to continue to take Flonase and a daily allergy medication. 4.  We will follow up with her PCP or urgent care if symptoms fail to improve.  Final Clinical Impressions(s) / UC Diagnoses   Final diagnoses:  Acute non-recurrent sinusitis of other sinus  Acute upper respiratory infection    ED Discharge Orders        Ordered    HYDROcodone-homatropine (HYCODAN) 5-1.5 MG/5ML syrup  Every 6 hours PRN     07/23/17 0949    azithromycin (ZITHROMAX Z-PAK) 250 MG tablet     07/23/17 0949     Controlled Substance Prescriptions [place] Controlled Substance Registry consulted? Yes, I have consulted the [place] Controlled Substances Registry for this patient, and feel the risk/benefit ratio today is favorable for proceeding with this prescription for a controlled substance.   Lattie Corns, PA-C 07/23/17 1008

## 2017-07-23 NOTE — Discharge Instructions (Signed)
-  Take medications as prescribed. -Follow-up with PCP if symptoms worsen.

## 2017-07-23 NOTE — ED Triage Notes (Signed)
Patient c/o upper respiratory infection and chest congestion/ sore throat.

## 2017-07-26 LAB — CULTURE, GROUP A STREP (THRC)

## 2017-07-28 DIAGNOSIS — R05 Cough: Secondary | ICD-10-CM | POA: Diagnosis not present

## 2017-07-28 DIAGNOSIS — J40 Bronchitis, not specified as acute or chronic: Secondary | ICD-10-CM | POA: Diagnosis not present

## 2017-07-31 ENCOUNTER — Encounter: Payer: Self-pay | Admitting: Nurse Practitioner

## 2017-07-31 ENCOUNTER — Emergency Department: Payer: 59

## 2017-07-31 ENCOUNTER — Emergency Department
Admission: EM | Admit: 2017-07-31 | Discharge: 2017-07-31 | Disposition: A | Discharge status: Home / Self Care | Payer: 59 | Attending: Emergency Medicine | Admitting: Emergency Medicine

## 2017-07-31 ENCOUNTER — Other Ambulatory Visit: Payer: Self-pay

## 2017-07-31 ENCOUNTER — Encounter: Payer: Self-pay | Admitting: Emergency Medicine

## 2017-07-31 DIAGNOSIS — R0602 Shortness of breath: Secondary | ICD-10-CM | POA: Diagnosis not present

## 2017-07-31 DIAGNOSIS — R0789 Other chest pain: Secondary | ICD-10-CM | POA: Insufficient documentation

## 2017-07-31 DIAGNOSIS — I10 Essential (primary) hypertension: Secondary | ICD-10-CM | POA: Insufficient documentation

## 2017-07-31 DIAGNOSIS — R05 Cough: Secondary | ICD-10-CM | POA: Insufficient documentation

## 2017-07-31 DIAGNOSIS — Z79899 Other long term (current) drug therapy: Secondary | ICD-10-CM | POA: Diagnosis not present

## 2017-07-31 DIAGNOSIS — Z7982 Long term (current) use of aspirin: Secondary | ICD-10-CM | POA: Insufficient documentation

## 2017-07-31 DIAGNOSIS — R079 Chest pain, unspecified: Secondary | ICD-10-CM | POA: Diagnosis not present

## 2017-07-31 LAB — CBC
HCT: 43 % (ref 35.0–47.0)
Hemoglobin: 14.1 g/dL (ref 12.0–16.0)
MCH: 30.5 pg (ref 26.0–34.0)
MCHC: 32.9 g/dL (ref 32.0–36.0)
MCV: 92.8 fL (ref 80.0–100.0)
Platelets: 274 10*3/uL (ref 150–440)
RBC: 4.63 MIL/uL (ref 3.80–5.20)
RDW: 12.4 % (ref 11.5–14.5)
WBC: 12 10*3/uL — ABNORMAL HIGH (ref 3.6–11.0)

## 2017-07-31 LAB — COMPREHENSIVE METABOLIC PANEL
ALT: 26 U/L (ref 14–54)
AST: 36 U/L (ref 15–41)
Albumin: 4.4 g/dL (ref 3.5–5.0)
Alkaline Phosphatase: 85 U/L (ref 38–126)
Anion gap: 11 (ref 5–15)
BUN: 10 mg/dL (ref 6–20)
CO2: 26 mmol/L (ref 22–32)
Calcium: 9.5 mg/dL (ref 8.9–10.3)
Chloride: 100 mmol/L — ABNORMAL LOW (ref 101–111)
Creatinine, Ser: 0.81 mg/dL (ref 0.44–1.00)
GFR calc Af Amer: >60 mL/min (ref >60)
GFR calc non Af Amer: >60 mL/min (ref >60)
Glucose, Bld: 153 mg/dL — ABNORMAL HIGH (ref 65–99)
Potassium: 4.4 mmol/L (ref 3.5–5.1)
Sodium: 137 mmol/L (ref 135–145)
Total Bilirubin: 0.7 mg/dL (ref 0.3–1.2)
Total Protein: 8.4 g/dL — ABNORMAL HIGH (ref 6.5–8.1)

## 2017-07-31 LAB — INFLUENZA PANEL BY PCR (TYPE A & B)
Influenza A By PCR: NEGATIVE
Influenza B By PCR: NEGATIVE

## 2017-07-31 LAB — TROPONIN I: Troponin I: <0.03 ng/mL (ref <0.03)

## 2017-07-31 MED ORDER — METHYLPREDNISOLONE SODIUM SUCC 125 MG IJ SOLR
125.0000 mg | Freq: Once | INTRAMUSCULAR | Status: AC
  Administered 2017-07-31: 125 mg via INTRAVENOUS
  Filled 2017-07-31: qty 2

## 2017-07-31 MED ORDER — HYDROCOD POLST-CPM POLST ER 10-8 MG/5ML PO SUER: 5.0000 mL | Freq: Two times a day (BID) | ORAL | 0 refills | Status: DC | PRN

## 2017-07-31 MED ORDER — IOPAMIDOL (ISOVUE-370) INJECTION 76%
75.0000 mL | Freq: Once | INTRAVENOUS | Status: AC | PRN
  Administered 2017-07-31: 75 mL via INTRAVENOUS
  Filled 2017-07-31: qty 75

## 2017-07-31 MED ORDER — MORPHINE SULFATE (PF) 4 MG/ML IV SOLN
4.0000 mg | Freq: Once | INTRAVENOUS | Status: AC
  Administered 2017-07-31: 4 mg via INTRAVENOUS
  Filled 2017-07-31: qty 1

## 2017-07-31 MED ORDER — PREDNISONE 10 MG (48) PO TBPK: ORAL_TABLET | ORAL | 0 refills | Status: DC

## 2017-07-31 MED ORDER — BENZONATATE 200 MG PO CAPS: 200.0000 mg | ORAL_CAPSULE | Freq: Three times a day (TID) | ORAL | 0 refills | Status: DC | PRN

## 2017-07-31 NOTE — Progress Notes (Signed)
Spoke with patient regarding her symptoms and onset.  Patient states she started getting sick about 2 weeks ago.  Patient was seen at [facility] on 2/24 and given abx and cough medicine.  Patient thought she was getting better, but ended up going to her PCP's office on 3/1 and was given Omnicef and prednisone.  Patient thought she was feeling better on Saturday 3/2, but then developed pain with coughing that radiates to her back on 3/3.  Informed patient that I could not rule out MI symptoms; however, I have no way to perform and xray or EKG.  Informed patient I recommend she go to the ER to rule out these symptoms.  Patient was very pleasant and verbalized understanding.

## 2017-07-31 NOTE — ED Provider Notes (Signed)
[facility] Emergency Department Provider Note  ____________________________________________   First MD Initiated Contact with Patient 07/31/17 1555     (approximate)  I have reviewed the triage vital signs and the nursing notes.   HISTORY  Chief Complaint Chest Pain and Cough    HPI Lori Morales is a 57 y.o. female presents emergency department complaining of right-sided chest pain.  She states she has had a cough and congestion for over 2 weeks.  She is been on 2 rounds of antibiotics.  A steroid pack and inhalers.  She is very concerned about the pain in the right chest is worse with cough.  She does take hormone replacement.  She is a non-smoker.  She denies any pain in her lower legs.   Past Medical History:  Diagnosis Date  . Arthritis   . Depression   . GERD (gastroesophageal reflux disease)   . Headache    migraines  . Hypertension   . IBS (irritable bowel syndrome)   . Palpitations   . PONV (postoperative nausea and vomiting)   . Restless legs syndrome     Patient Active Problem List   Diagnosis Date Noted  . Cervical disc disorder with radiculopathy of cervical region 04/12/2016  . Nonallopathic lesion of thoracic region 04/12/2016  . Nonallopathic lesion of rib cage 04/12/2016    Past Surgical History:  Procedure Laterality Date  . ABDOMINOPLASTY    . BACK SURGERY     1 laminectomy, 1 fusion  . COLONOSCOPY    . COLONOSCOPY WITH PROPOFOL N/A 01/17/2017   Procedure: COLONOSCOPY WITH PROPOFOL;  Surgeon: Lollie Sails, MD;  Location: [facility] ENDOSCOPY;  Service: Endoscopy;  Laterality: N/A;  . WISDOM TOOTH EXTRACTION      Prior to Admission medications   Medication Sig Start Date End Date Taking? Authorizing Provider  amitriptyline (ELAVIL) 10 MG tablet Take by mouth every other day.  07/21/15 07/20/16  [provider]  aspirin EC 81 MG tablet Take 81 mg by mouth daily.    [provider]  benzonatate  (TESSALON) 200 MG capsule Take 1 capsule (200 mg total) by mouth 3 (three) times daily as needed for cough (during the day while at work). 07/31/17   Caryn Section Linden Dolin, PA-C  Blood Glucose Monitoring Suppl (FREESTYLE LITE) DEVI  07/13/17   [provider]  cefdinir (OMNICEF) 300 MG capsule  07/28/17   [provider]  celecoxib (CELEBREX) 200 MG capsule Take 200 mg by mouth daily as needed for moderate pain.     [provider]  chlorpheniramine-HYDROcodone (TUSSIONEX PENNKINETIC ER) 10-8 MG/5ML SUER Take 5 mLs by mouth every 12 (twelve) hours as needed for cough. 07/31/17   Fisher, Linden Dolin, PA-C  cholecalciferol (VITAMIN D) 1000 units tablet Take 1,000 Units by mouth daily.    [provider]  fluconazole (DIFLUCAN) 150 MG tablet  06/12/17   [provider]  FREESTYLE LITE test strip  07/13/17   [provider]  HYDROcodone-homatropine (HYCODAN) 5-1.5 MG/5ML syrup Take 5 mLs by mouth every 6 (six) hours as needed for cough. 07/23/17   Lattie Corns, PA-C  Lancets (FREESTYLE) lancets  07/13/17   [provider]  metoprolol succinate (TOPROL-XL) 25 MG 24 hr tablet Take 25 mg by mouth daily.    [provider]  MIMVEY LO 0.5-0.1 MG per tablet Take 1 tablet by mouth at bedtime.  10/22/14   [provider]  pantoprazole (PROTONIX) 40 MG tablet Take  40 mg by mouth at bedtime.     [provider]  pramipexole (MIRAPEX) 0.25 MG tablet Take 0.25 mg by mouth at bedtime.    [provider]  predniSONE (STERAPRED UNI-PAK 48 TAB) 10 MG (48) TBPK tablet Take 6 pills for 2 days then decrease by 1 pill every 2 days 07/31/17   Versie Starks, PA-C  vitamin B-12 (CYANOCOBALAMIN) 1000 MCG tablet Take by mouth.    [provider]  zolpidem (AMBIEN CR) 6.25 MG CR tablet  05/07/15   [provider]    Allergies Penicillins and Pristiq [desvenlafaxine]  Family History  Problem Relation Age of Onset  . CAD  Mother   . Stroke Mother   . Heart attack Father   . Breast cancer Maternal Aunt 53    Social History Social History   Tobacco Use  . Smoking status: Never Smoker  . Smokeless tobacco: Never Used  Substance Use Topics  . Alcohol use: No  . Drug use: No    Review of Systems  Constitutional: No fever/chills Eyes: No visual changes. ENT: No sore throat. Respiratory: Positive cough Cardiac.  Positive for right sided chest pain Genitourinary: Negative for dysuria. Musculoskeletal: Negative for back pain. Skin: Negative for rash.    ____________________________________________   PHYSICAL EXAM:  VITAL SIGNS: ED Triage Vitals  Enc Vitals Group     BP 07/31/17 1347 (!) 165/79     Pulse Rate 07/31/17 1347 82     Resp 07/31/17 1347 20     Temp 07/31/17 1347 97.7 F (36.5 C)     Temp Source 07/31/17 1347 Oral     SpO2 07/31/17 1347 98 %     Weight 07/31/17 1348 165 lb (74.8 kg)     Height 07/31/17 1348 '5\' 5"'  (1.651 m)     Head Circumference --      Peak Flow --      Pain Score 07/31/17 1353 10     Pain Loc --      Pain Edu? --      Excl. in [place]? --     Constitutional: Alert and oriented. Well appearing and in no acute distress. Eyes: Conjunctivae are normal.  Head: Atraumatic. Nose: No congestion/rhinnorhea. Mouth/Throat: Mucous membranes are moist.   Cardiovascular: Normal rate, regular rhythm.  Heart sounds are normal Respiratory: Normal respiratory effort.  No retractions, lungs are clear to auscultation, cough is dry and hacking GU: deferred Musculoskeletal: FROM all extremities, warm and well perfused Neurologic:  Normal speech and language.  Skin:  Skin is warm, dry and intact. No rash noted. Psychiatric: Mood and affect are normal. Speech and behavior are normal.  ____________________________________________   LABS (all labs ordered are listed, but only abnormal results are displayed)  Labs Reviewed  CBC - Abnormal; Notable for the following  components:      Result Value   WBC 12.0 (*)    All other components within normal limits  COMPREHENSIVE METABOLIC PANEL - Abnormal; Notable for the following components:   Chloride 100 (*)    Glucose, Bld 153 (*)    Total Protein 8.4 (*)    All other components within normal limits  TROPONIN I  INFLUENZA PANEL BY PCR (TYPE A & B)   ____________________________________________   ____________________________________________  RADIOLOGY  Chest x-ray is negative CT of the chest is negative for PE, positive for linear atelectasis or scarring  ____________________________________________   PROCEDURES  Procedure(s) performed: Saline lock, Solu-Medrol 125 mg IV, morphine  4 mg IV  Procedures    ____________________________________________   INITIAL IMPRESSION / ASSESSMENT AND PLAN / ED COURSE  Pertinent labs & imaging results that were available during my care of the patient were reviewed by me and considered in my medical decision making (see chart for details).  She is 57 year old female presents emergency department complaining of right-sided chest pain.  She has had a cough and congestion for greater than 2 weeks.  She states she has not gotten any better.  She been on 2 rounds of antibiotics, steroids and inhalers.  She is also on Tussionex.  She states none of these are helping.  On physical exam patient appears anxious.  She has a dry hacking cough which seems to reproduce the pain.  Other than that the exam is benign  CBC shows an elevated white count, met C is basically normal, influenza is negative, troponin is less than 0.03, EKG shows normal sinus rhythm with possible left atrial enlargement.  Chest x-ray is negative.  CT of the chest for PE was ordered due to hormone replacement therapy and pain   CT of the chest is negative for PE, positive for linear atelectasis or scarring  CT results were discussed with patient.  She was given Solu-Medrol 125 mg IV, morphine 4  mg IV.  She was instructed to continue the current medications.  She is to start another steroid pack tomorrow.  She was given a prescription for Tussionex 150 mL's 5 mL's every 12 hours while at home.  Tessalon Perles 200 mg 3 times a day as needed for cough while at work.  She was instructed to follow-up with her regular doctor as needed.  She was discharged in stable condition  As part of my medical decision making, I reviewed the following data within the [place] notes reviewed and incorporated, Labs reviewed influenza test is negative, complete metabolic panel basically normal, troponin is negative, CBC with elevated white count 12, EKG interpreted normal sinus rhythm with left atrial enlargement interpreted by the heart station Dr., Old chart reviewed, Radiograph reviewed x-ray of the chest is negative, CT of the chest is negative for PE, Notes from prior ED visits and [place] Controlled Substance Database  ____________________________________________   FINAL CLINICAL IMPRESSION(S) / ED DIAGNOSES  Final diagnoses:  Chest wall pain      NEW MEDICATIONS STARTED DURING THIS VISIT:  New Prescriptions   BENZONATATE (TESSALON) 200 MG CAPSULE    Take 1 capsule (200 mg total) by mouth 3 (three) times daily as needed for cough (during the day while at work).   CHLORPHENIRAMINE-HYDROCODONE (TUSSIONEX PENNKINETIC ER) 10-8 MG/5ML SUER    Take 5 mLs by mouth every 12 (twelve) hours as needed for cough.   PREDNISONE (STERAPRED UNI-PAK 48 TAB) 10 MG (48) TBPK TABLET    Take 6 pills for 2 days then decrease by 1 pill every 2 days     Note:  This document was prepared using Dragon voice recognition software and may include unintentional dictation errors.    Versie Starks, PA-C 07/31/17 [place], [place], MD 08/01/17 1540

## 2017-07-31 NOTE — Discharge Instructions (Signed)
Follow-up with your regular doctor if not better in 3-5 days.  Use medication as prescribed.  If you are worsening please return to the emergency department.  You should rest and drink fluids

## 2017-07-31 NOTE — ED Notes (Addendum)
See triage note  States she developed some sinus pressure about 2 weeks ago  Was dx'd with sinusitis and placed on z-pak.. Then cough became worse   Was seen at [facility]  dx'd with bronchitis  Placed on prednisone  States this weekend  Pain became worse with cough and breathing

## 2017-07-31 NOTE — ED Triage Notes (Signed)
Cough x 2 weeks. Yesterday started R chest pain.

## 2017-08-04 ENCOUNTER — Ambulatory Visit: Payer: Self-pay | Admitting: Dietician

## 2017-08-07 DIAGNOSIS — M519 Unspecified thoracic, thoracolumbar and lumbosacral intervertebral disc disorder: Secondary | ICD-10-CM | POA: Diagnosis not present

## 2017-08-07 DIAGNOSIS — M47812 Spondylosis without myelopathy or radiculopathy, cervical region: Secondary | ICD-10-CM | POA: Diagnosis not present

## 2017-08-07 DIAGNOSIS — R739 Hyperglycemia, unspecified: Secondary | ICD-10-CM | POA: Diagnosis not present

## 2017-08-07 DIAGNOSIS — E7849 Other hyperlipidemia: Secondary | ICD-10-CM | POA: Diagnosis not present

## 2017-08-14 DIAGNOSIS — E7849 Other hyperlipidemia: Secondary | ICD-10-CM | POA: Diagnosis not present

## 2017-08-14 DIAGNOSIS — F3289 Other specified depressive episodes: Secondary | ICD-10-CM | POA: Diagnosis not present

## 2017-08-14 DIAGNOSIS — R5383 Other fatigue: Secondary | ICD-10-CM | POA: Diagnosis not present

## 2017-08-14 DIAGNOSIS — M519 Unspecified thoracic, thoracolumbar and lumbosacral intervertebral disc disorder: Secondary | ICD-10-CM | POA: Diagnosis not present

## 2017-08-25 ENCOUNTER — Encounter: Payer: Self-pay | Admitting: Dietician

## 2017-08-25 ENCOUNTER — Encounter: Payer: 59 | Attending: Internal Medicine | Admitting: Dietician

## 2017-08-25 VITALS — Ht 64.5 in | Wt 164.9 lb

## 2017-08-25 DIAGNOSIS — R739 Hyperglycemia, unspecified: Secondary | ICD-10-CM | POA: Insufficient documentation

## 2017-08-25 DIAGNOSIS — Z713 Dietary counseling and surveillance: Secondary | ICD-10-CM | POA: Insufficient documentation

## 2017-08-25 NOTE — Progress Notes (Signed)
Medical Nutrition Therapy: Visit start time: 3086 end time: 1415  Assessment:  Diagnosis: hyperglycemia Past medical history: Depression, HLD, IBS Psychosocial issues/ stress concerns: dx of anxiety/ depression, controlled Preferred learning method:  . Auditory . Visual . Hands-on  Current weight: 164.9# (post lunch meal)  Height: 5'4.5" Medications, supplements: Protonix, Vit D3 2000 IU, Mirapex, Mimvey  Progress and evaluation: Patient reports implementing several lifestyle changes since learning of HgbA1c of 6.2 in February 2019. Feels that this value to some degree is elevated d/t taking prednisone for a number of weeks. Has felt sx of hypoglyemia a couple of times. Also reports slow weight gain from age 57 until currently which she has struggled to lose. Diet history includes low carb diet and the "old" [employer] program which she did not enjoy nearly as much as the new [employer] program which she currently follows. Re-joined [facility] February 12 after HgbA1c lab and reports wt loss of 9#. Goal wt 145-150#. Denies feeling overly hungry, as when she does get hungry between meals she chooses "zero point" foods to eat such as fruit, vegetables, small serving of nut butter + apple, a lower carb protein shake or hard boiled eggs. Main change to her diet post joining [employer] is her focus on reducing dietary sugar, as she was not focused on this before. Feels that [employer] program is helping her to be more flexible with her food choices, allowing her not to cut out any foods, and overall be more conscious of her food choices. Her family does not eat pork and only eats fried food 1x/ month from [place]. When going to [facility], she chooses "skinny" drinks or chooses a lower calorie option like an Americano with a splash of milk.  Physical activity: Circuit training 3 x/wk for 1 hour each session. 180 min/week total per her report. She joined the [employer] gym with her husband who also has health-related goals. Planning to  increase activity to 4x/wk within the next month.  Dietary Intake:  Usual eating pattern includes 3 meals and 2 snacks per day. Dining out frequency: 1 meals per month (Ruby Tuesdays, [employer]). 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  Breakfast: 1/2 cup oatmeal + blueberries + boiled egg, Kodiac cakes waffles (1/2 cup serving) with light syrup, English muffin "light wheat" + egg + canadian bacon Snack: boiled egg or another "zero point" food such as fruit Lunch: grilled or baked chicken + 1-2 veggies or salad bar with grilled chicken and 1-2 tsp viniagrette dressing Snack: apple + peanut butter Supper: venison, grilled chicken, fish, pork shoulder rarely + green vegetable + ferro/ quinoa/ brown rice/ sweet potatoes Snack: n/a Beverages: water, Starbucks coffee, Skinny Mocha or Americano with a splash of milk  Nutrition Care Education: Topics covered: recommended exercise and weight loss guidelines to prevent diabetes, how to overcome weight loss plateaus, learning from principles in the [employer] program and creating lifestyle changes to carry beyond the program's end, carb cycling, protein + carb combination snacks to combat low blood sugar, being flexible with the diet and avoiding feelings of deprivation Basic nutrition: basic food groups, appropriate nutrient balance, appropriate meal and snack schedule, general nutrition guidelines    Weight control: benefits of weight control, identifying healthy weight, determining reasonable weight goal, behavioral changes for weight loss Advanced nutrition: cooking techniques, dining out Hyperlipidemia: healthy and unhealthy fats, role of fiber Other lifestyle changes:  benefits of making changes, readiness for change, identifying habits that need to change  Nutritional Diagnosis:  [place]-2.2  Altered nutrition-related laboratory As related to lifestyle habits and recent steroid use.  As evidenced by Glucose 153 H, suspected episodes of hypoglycemia.  Intervention:  Discussion as noted above. Patient plans to continue to follow [facility] program, as she feels that the habits she is forming are sustainable. She was provided a daily carbohydrate gram goal to hit daily (170-180g) should she decide to start tracking daily carbohydrates in order to encourage further weight loss. She plans to increase her physical activity as well. A follow up may be scheduled once she calls the insurance company to inquire about requirements.   Education Materials given:  . Healthy Habits to Prevent Diabetes handout . Goals/ instructions  Learner/ who was taught:  . Patient  Level of understanding: Marland Kitchen Verbalizes/ demonstrates competency  Demonstrated degree of understanding via:   Teach back Learning barriers: . None  Willingness to learn/ readiness for change: . Eager, change in progress  Monitoring and Evaluation:  Dietary intake, exercise, HgbA1c, and body weight      follow up: prn

## 2017-08-30 DIAGNOSIS — R922 Inconclusive mammogram: Secondary | ICD-10-CM | POA: Diagnosis not present

## 2017-08-30 DIAGNOSIS — N870 Mild cervical dysplasia: Secondary | ICD-10-CM | POA: Diagnosis not present

## 2017-08-30 DIAGNOSIS — Z01419 Encounter for gynecological examination (general) (routine) without abnormal findings: Secondary | ICD-10-CM | POA: Diagnosis not present

## 2017-08-30 DIAGNOSIS — Z1231 Encounter for screening mammogram for malignant neoplasm of breast: Secondary | ICD-10-CM | POA: Diagnosis not present

## 2017-08-30 DIAGNOSIS — N951 Menopausal and female climacteric states: Secondary | ICD-10-CM | POA: Diagnosis not present

## 2017-10-16 ENCOUNTER — Ambulatory Visit (INDEPENDENT_AMBULATORY_CARE_PROVIDER_SITE_OTHER): Payer: Self-pay | Admitting: Adult Health

## 2017-10-16 ENCOUNTER — Encounter: Payer: Self-pay | Admitting: Adult Health

## 2017-10-16 VITALS — BP 150/90 | HR 58 | Temp 97.8°F | Wt 164.0 lb

## 2017-10-16 DIAGNOSIS — F329 Major depressive disorder, single episode, unspecified: Secondary | ICD-10-CM | POA: Insufficient documentation

## 2017-10-16 DIAGNOSIS — H6992 Unspecified Eustachian tube disorder, left ear: Secondary | ICD-10-CM

## 2017-10-16 DIAGNOSIS — F32A Depression, unspecified: Secondary | ICD-10-CM | POA: Insufficient documentation

## 2017-10-16 DIAGNOSIS — G43909 Migraine, unspecified, not intractable, without status migrainosus: Secondary | ICD-10-CM | POA: Insufficient documentation

## 2017-10-16 DIAGNOSIS — H66002 Acute suppurative otitis media without spontaneous rupture of ear drum, left ear: Secondary | ICD-10-CM

## 2017-10-16 DIAGNOSIS — H6982 Other specified disorders of Eustachian tube, left ear: Secondary | ICD-10-CM

## 2017-10-16 MED ORDER — CEPHALEXIN 500 MG PO CAPS: 500.0000 mg | ORAL_CAPSULE | Freq: Two times a day (BID) | ORAL | 0 refills | Status: DC

## 2017-10-16 MED ORDER — PREDNISONE 10 MG (21) PO TBPK: ORAL_TABLET | ORAL | 0 refills | Status: DC

## 2017-10-16 NOTE — Patient Instructions (Addendum)
Cephalexin tablets or capsules What is this medicine? CEPHALEXIN (sef a LEX in) is a cephalosporin antibiotic. It is used to treat certain kinds of bacterial infections It will not work for colds, flu, or other viral infections. This medicine may be used for other purposes; ask your health care provider or pharmacist if you have questions. COMMON BRAND NAME(S): Biocef, Daxbia, Keflex, Keftab What should I tell my health care provider before I take this medicine? They need to know if you have any of these conditions: -kidney disease -stomach or intestine problems, especially colitis -an unusual or allergic reaction to cephalexin, other cephalosporins, penicillins, other antibiotics, medicines, foods, dyes or preservatives -pregnant or trying to get pregnant -breast-feeding How should I use this medicine? Take this medicine by mouth with a full glass of water. Follow the directions on the prescription label. This medicine can be taken with or without food. Take your medicine at regular intervals. Do not take your medicine more often than directed. Take all of your medicine as directed even if you think you are better. Do not skip doses or stop your medicine early. Talk to your pediatrician regarding the use of this medicine in children. While this drug may be prescribed for selected conditions, precautions do apply. Overdosage: If you think you have taken too much of this medicine contact a poison control center or emergency room at once. NOTE: This medicine is only for you. Do not share this medicine with others. What if I miss a dose? If you miss a dose, take it as soon as you can. If it is almost time for your next dose, take only that dose. Do not take double or extra doses. There should be at least 4 to 6 hours between doses. What may interact with this medicine? -probenecid -some other antibiotics This list may not describe all possible interactions. Give your health care provider a list of  all the medicines, herbs, non-prescription drugs, or dietary supplements you use. Also tell them if you smoke, drink alcohol, or use illegal drugs. Some items may interact with your medicine. What should I watch for while using this medicine? Tell your doctor or health care professional if your symptoms do not begin to improve in a few days. Do not treat diarrhea with over the counter products. Contact your doctor if you have diarrhea that lasts more than 2 days or if it is severe and watery. If you have diabetes, you may get a false-positive result for sugar in your urine. Check with your doctor or health care professional. What side effects may I notice from receiving this medicine? Side effects that you should report to your doctor or health care professional as soon as possible: -allergic reactions like skin rash, itching or hives, swelling of the face, lips, or tongue -breathing problems -pain or trouble passing urine -redness, blistering, peeling or loosening of the skin, including inside the mouth -severe or watery diarrhea -unusually weak or tired -yellowing of the eyes, skin Side effects that usually do not require medical attention (report to your doctor or health care professional if they continue or are bothersome): -gas or heartburn -genital or anal irritation -headache -joint or muscle pain -nausea, vomiting This list may not describe all possible side effects. Call your doctor for medical advice about side effects. You may report side effects to FDA at 1-800-FDA-1088. Where should I keep my medicine? Keep out of the reach of children. Store at room temperature between 59 and 86 degrees F (15 and  30 degrees C). Throw away any unused medicine after the expiration date. NOTE: This sheet is a summary. It may not cover all possible information. If you have questions about this medicine, talk to your doctor, pharmacist, or health care provider.  2018 Elsevier/Gold Standard  (2007-08-20 17:09:13) Prednisone tablets What is this medicine? PREDNISONE (PRED ni sone) is a corticosteroid. It is commonly used to treat inflammation of the skin, joints, lungs, and other organs. Common conditions treated include asthma, allergies, and arthritis. It is also used for other conditions, such as blood disorders and diseases of the adrenal glands. This medicine may be used for other purposes; ask your health care provider or pharmacist if you have questions. COMMON BRAND NAME(S): Deltasone, Predone, Sterapred, Sterapred DS What should I tell my health care provider before I take this medicine? They need to know if you have any of these conditions: -Cushing's syndrome -diabetes -glaucoma -heart disease -high blood pressure -infection (especially a virus infection such as chickenpox, cold sores, or herpes) -kidney disease -liver disease -mental illness -myasthenia gravis -osteoporosis -seizures -stomach or intestine problems -thyroid disease -an unusual or allergic reaction to lactose, prednisone, other medicines, foods, dyes, or preservatives -pregnant or trying to get pregnant -breast-feeding How should I use this medicine? Take this medicine by mouth with a glass of water. Follow the directions on the prescription label. Take this medicine with food. If you are taking this medicine once a day, take it in the morning. Do not take more medicine than you are told to take. Do not suddenly stop taking your medicine because you may develop a severe reaction. Your doctor will tell you how much medicine to take. If your doctor wants you to stop the medicine, the dose may be slowly lowered over time to avoid any side effects. Talk to your pediatrician regarding the use of this medicine in children. Special care may be needed. Overdosage: If you think you have taken too much of this medicine contact a poison control center or emergency room at once. NOTE: This medicine is only for  you. Do not share this medicine with others. What if I miss a dose? If you miss a dose, take it as soon as you can. If it is almost time for your next dose, talk to your doctor or health care professional. You may need to miss a dose or take an extra dose. Do not take double or extra doses without advice. What may interact with this medicine? Do not take this medicine with any of the following medications: -metyrapone -mifepristone This medicine may also interact with the following medications: -aminoglutethimide -amphotericin B -aspirin and aspirin-like medicines -barbiturates -certain medicines for diabetes, like glipizide or glyburide -cholestyramine -cholinesterase inhibitors -cyclosporine -digoxin -diuretics -ephedrine -female hormones, like estrogens and birth control pills -isoniazid -ketoconazole -NSAIDS, medicines for pain and inflammation, like ibuprofen or naproxen -phenytoin -rifampin -toxoids -vaccines -warfarin This list may not describe all possible interactions. Give your health care provider a list of all the medicines, herbs, non-prescription drugs, or dietary supplements you use. Also tell them if you smoke, drink alcohol, or use illegal drugs. Some items may interact with your medicine. What should I watch for while using this medicine? Visit your doctor or health care professional for regular checks on your progress. If you are taking this medicine over a prolonged period, carry an identification card with your name and address, the type and dose of your medicine, and your doctor's name and address. This medicine may increase your  risk of getting an infection. Tell your doctor or health care professional if you are around anyone with measles or chickenpox, or if you develop sores or blisters that do not heal properly. If you are going to have surgery, tell your doctor or health care professional that you have taken this medicine within the last twelve months. Ask  your doctor or health care professional about your diet. You may need to lower the amount of salt you eat. This medicine may affect blood sugar levels. If you have diabetes, check with your doctor or health care professional before you change your diet or the dose of your diabetic medicine. What side effects may I notice from receiving this medicine? Side effects that you should report to your doctor or health care professional as soon as possible: -allergic reactions like skin rash, itching or hives, swelling of the face, lips, or tongue -changes in emotions or moods -changes in vision -depressed mood -eye pain -fever or chills, cough, sore throat, pain or difficulty passing urine -increased thirst -swelling of ankles, feet Side effects that usually do not require medical attention (report to your doctor or health care professional if they continue or are bothersome): -confusion, excitement, restlessness -headache -nausea, vomiting -skin problems, acne, thin and shiny skin -trouble sleeping -weight gain This list may not describe all possible side effects. Call your doctor for medical advice about side effects. You may report side effects to FDA at 1-800-FDA-1088. Where should I keep my medicine? Keep out of the reach of children. Store at room temperature between 15 and 30 degrees C (59 and 86 degrees F). Protect from light. Keep container tightly closed. Throw away any unused medicine after the expiration date. NOTE: This sheet is a summary. It may not cover all possible information. If you have questions about this medicine, talk to your doctor, pharmacist, or health care provider.  2018 Elsevier/Gold Standard (2010-12-30 10:57:14)

## 2017-10-16 NOTE — Progress Notes (Addendum)
Subjective:     Patient ID: Lori Morales, female   DOB: [birth year], 57 y.o.   MRN: [id number]  HPI   Blood pressure (!) 150/90, pulse (!) 58, temperature 97.8 F (36.6 C), weight 164 lb (74.4 kg), last menstrual period 02/13/2012, SpO2 99 %. Denies any chance of pregnancy post menopause.   Patient is a 57 year old pleasant female in no acute distress with pressure in left ear pain and pressure after spending two 2 weeks in [place]. Left sided ear pain/ pressure. Left sided facial pressure maxillary area. Rhinorrhea. Sinus congestion.  Patient  denies any fever, body aches,chills, rash, chest pain, shortness of breath, nausea, vomiting, or diarrhea.  She is allergic to penicillin- only reports she has taken cephalexin/ Keflex without any issue.      Allergies  Allergen Reactions  . Penicillins Hives and Rash  . Pristiq [Desvenlafaxine] Swelling and Other (See Comments)    Headaches (reaction to Pristiq)    She has been taking sudafed- discussed blood pressure reading and to monitor per guideline parameters and avoid decongestants if possible..    Review of Systems     Objective:   Physical Exam  Constitutional: She is oriented to person, place, and time. Vital signs are normal. She appears well-developed and well-nourished. She is active.  Non-toxic appearance. She does not have a sickly appearance. She does not appear ill. No distress. She is not intubated.  Patient is alert and oriented and responsive to questions Engages in eye contact with provider. Speaks in full sentences without any pauses without any shortness of breath.    HENT:  Head: Normocephalic and atraumatic.  Right Ear: Hearing and external ear normal. Tympanic membrane is not perforated and not erythematous. No middle ear effusion.  Left Ear: Hearing and external ear normal. Tympanic membrane is erythematous. Tympanic membrane is not perforated. A middle ear effusion is present.  Nose: Mucosal edema and  rhinorrhea present. Right sinus exhibits no maxillary sinus tenderness and no frontal sinus tenderness. Left sinus exhibits no maxillary sinus tenderness and no frontal sinus tenderness.  Mouth/Throat: Uvula is midline and mucous membranes are normal. Posterior oropharyngeal erythema present. No oropharyngeal exudate, posterior oropharyngeal edema or tonsillar abscesses.  Eyes: Pupils are equal, round, and reactive to light. Conjunctivae and EOM are normal. Right eye exhibits no discharge. Left eye exhibits no discharge. No scleral icterus.  Neck: Normal range of motion. Neck supple. No JVD present. No tracheal deviation present.  Cardiovascular: Normal rate, regular rhythm, normal heart sounds and intact distal pulses. Exam reveals no gallop and no friction rub.  No murmur heard. Pulmonary/Chest: Effort normal and breath sounds normal. No accessory muscle usage or stridor. No apnea, no tachypnea and no bradypnea. She is not intubated. No respiratory distress. She has no wheezes. She has no rales. She exhibits no tenderness.  Abdominal: Soft. Bowel sounds are normal.  Musculoskeletal: Normal range of motion.  Patient moves on and off of exam table and in room without difficulty. Gait is normal in hall and in room. Patient is oriented to person place time and situation. Patient answers questions appropriately and engages in conversation.   Lymphadenopathy:       Head (right side): No submental, no submandibular, no tonsillar, no preauricular, no posterior auricular and no occipital adenopathy present.       Head (left side): No submental, no submandibular, no tonsillar, no preauricular, no posterior auricular and no occipital adenopathy present.    She has no cervical adenopathy.  She has no axillary adenopathy.  Neurological: She is alert and oriented to person, place, and time. She displays normal reflexes. No cranial nerve deficit. She exhibits normal muscle tone. Coordination normal.  Skin:  Skin is warm and dry. Capillary refill takes less than 2 seconds. No rash noted. She is not diaphoretic. No erythema. No pallor.  Psychiatric: She has a normal mood and affect. Her speech is normal and behavior is normal. Judgment and thought content normal. Cognition and memory are normal.  Vitals reviewed.      Assessment:     Eustachian tube dysfunction, left  Non-recurrent acute suppurative otitis media of left ear without spontaneous rupture of tympanic membrane     Plan:     Meds ordered this encounter  Medications  . cephALEXin (KEFLEX) 500 MG capsule    Sig: Take 1 capsule (500 mg total) by mouth 2 (two) times daily.    Dispense:  20 capsule    Refill:  0  . predniSONE (STERAPRED UNI-PAK 21 TAB) 10 MG (21) TBPK tablet    Sig: By mouth Take 6 tablets on day 1, Take 5 tablets day 2 Take 4 tablets day 3 Take 3 tablets day 4 Take 2 tablets day five 5 Take 1 tablet day    Dispense:  21 tablet    Refill:  0  Flonase per package instructions bilateral nostrils- purchase over the counter.   Patient requests Keflex and reports she has taken without any issue at all since she has her penicillin allergy years ago.  Discussed signs and symptoms of allergic reaction and possibility of reaction. She is a Equities trader and verbalizes understanding and knows to stop medicine seek medical treatment based on severity. Have Benadryl on hand at all times. Discussed possibility of cross sensitivity.  She again confirms she has had no issues with Keflex ever and has taken multiple times. She is aware Azithromycin is drug of choice with any allergy. Discussed risks versus benefits.   Advised patient call the office or your primary care doctor for an appointment if no improvement within 72 hours or if any symptoms change or worsen at any time  Advised ER or urgent Care if after hours or on weekend. Call 911 for emergency symptoms at any time.Patinet verbalized understanding of all instructions  given/reviewed and treatment plan and has no further questions or concerns at this time.    Patient verbalized understanding of all instructions given and denies any further questions at this time.

## 2017-10-18 ENCOUNTER — Telehealth: Payer: Self-pay | Admitting: Emergency Medicine

## 2017-10-18 NOTE — Telephone Encounter (Signed)
Left message follow up call for visit with [facility] 

## 2018-02-07 DIAGNOSIS — M519 Unspecified thoracic, thoracolumbar and lumbosacral intervertebral disc disorder: Secondary | ICD-10-CM | POA: Diagnosis not present

## 2018-02-07 DIAGNOSIS — R5383 Other fatigue: Secondary | ICD-10-CM | POA: Diagnosis not present

## 2018-02-07 DIAGNOSIS — R739 Hyperglycemia, unspecified: Secondary | ICD-10-CM | POA: Diagnosis not present

## 2018-02-07 DIAGNOSIS — E7849 Other hyperlipidemia: Secondary | ICD-10-CM | POA: Diagnosis not present

## 2018-02-15 DIAGNOSIS — E7849 Other hyperlipidemia: Secondary | ICD-10-CM | POA: Diagnosis not present

## 2018-02-15 DIAGNOSIS — R739 Hyperglycemia, unspecified: Secondary | ICD-10-CM | POA: Diagnosis not present

## 2018-02-15 DIAGNOSIS — M519 Unspecified thoracic, thoracolumbar and lumbosacral intervertebral disc disorder: Secondary | ICD-10-CM | POA: Diagnosis not present

## 2018-02-15 DIAGNOSIS — E538 Deficiency of other specified B group vitamins: Secondary | ICD-10-CM | POA: Diagnosis not present

## 2018-03-27 ENCOUNTER — Other Ambulatory Visit: Payer: Self-pay | Admitting: Obstetrics and Gynecology

## 2018-03-27 DIAGNOSIS — Z1231 Encounter for screening mammogram for malignant neoplasm of breast: Secondary | ICD-10-CM

## 2018-04-11 DIAGNOSIS — L578 Other skin changes due to chronic exposure to nonionizing radiation: Secondary | ICD-10-CM | POA: Diagnosis not present

## 2018-04-11 DIAGNOSIS — Z85828 Personal history of other malignant neoplasm of skin: Secondary | ICD-10-CM | POA: Diagnosis not present

## 2018-04-11 DIAGNOSIS — L821 Other seborrheic keratosis: Secondary | ICD-10-CM | POA: Diagnosis not present

## 2018-04-11 DIAGNOSIS — L905 Scar conditions and fibrosis of skin: Secondary | ICD-10-CM | POA: Diagnosis not present

## 2018-04-11 DIAGNOSIS — Z808 Family history of malignant neoplasm of other organs or systems: Secondary | ICD-10-CM | POA: Diagnosis not present

## 2018-04-11 DIAGNOSIS — D1800 Hemangioma unspecified site: Secondary | ICD-10-CM | POA: Diagnosis not present

## 2018-04-11 DIAGNOSIS — Z1283 Encounter for screening for malignant neoplasm of skin: Secondary | ICD-10-CM | POA: Diagnosis not present

## 2018-04-11 DIAGNOSIS — D229 Melanocytic nevi, unspecified: Secondary | ICD-10-CM | POA: Diagnosis not present

## 2018-04-11 DIAGNOSIS — L820 Inflamed seborrheic keratosis: Secondary | ICD-10-CM | POA: Diagnosis not present

## 2018-04-17 ENCOUNTER — Ambulatory Visit
Admission: RE | Admit: 2018-04-17 | Discharge: 2018-04-17 | Disposition: A | Discharge status: Home / Self Care | Payer: 59 | Source: Ambulatory Visit | Attending: Obstetrics and Gynecology | Admitting: Obstetrics and Gynecology

## 2018-04-17 DIAGNOSIS — Z1231 Encounter for screening mammogram for malignant neoplasm of breast: Secondary | ICD-10-CM | POA: Diagnosis not present

## 2018-06-15 ENCOUNTER — Emergency Department: Payer: 59

## 2018-06-15 ENCOUNTER — Inpatient Hospital Stay
Admission: EM | Admit: 2018-06-15 | Discharge: 2018-06-16 | DRG: 065 | Disposition: A | Discharge status: Home / Self Care | Payer: 59 | Attending: Specialist | Admitting: Specialist

## 2018-06-15 ENCOUNTER — Inpatient Hospital Stay (HOSPITAL_COMMUNITY)
Admit: 2018-06-15 | Discharge: 2018-06-15 | Disposition: A | Discharge status: Home / Self Care | Payer: 59 | Attending: Internal Medicine | Admitting: Internal Medicine

## 2018-06-15 ENCOUNTER — Other Ambulatory Visit: Payer: Self-pay

## 2018-06-15 DIAGNOSIS — E876 Hypokalemia: Secondary | ICD-10-CM | POA: Diagnosis present

## 2018-06-15 DIAGNOSIS — Z981 Arthrodesis status: Secondary | ICD-10-CM | POA: Diagnosis not present

## 2018-06-15 DIAGNOSIS — I10 Essential (primary) hypertension: Secondary | ICD-10-CM | POA: Diagnosis not present

## 2018-06-15 DIAGNOSIS — Z8249 Family history of ischemic heart disease and other diseases of the circulatory system: Secondary | ICD-10-CM | POA: Diagnosis not present

## 2018-06-15 DIAGNOSIS — E785 Hyperlipidemia, unspecified: Secondary | ICD-10-CM | POA: Diagnosis present

## 2018-06-15 DIAGNOSIS — M1990 Unspecified osteoarthritis, unspecified site: Secondary | ICD-10-CM | POA: Diagnosis present

## 2018-06-15 DIAGNOSIS — R0789 Other chest pain: Secondary | ICD-10-CM | POA: Diagnosis not present

## 2018-06-15 DIAGNOSIS — F329 Major depressive disorder, single episode, unspecified: Secondary | ICD-10-CM | POA: Diagnosis present

## 2018-06-15 DIAGNOSIS — Q6101 Congenital single renal cyst: Secondary | ICD-10-CM

## 2018-06-15 DIAGNOSIS — G43909 Migraine, unspecified, not intractable, without status migrainosus: Secondary | ICD-10-CM | POA: Diagnosis present

## 2018-06-15 DIAGNOSIS — I639 Cerebral infarction, unspecified: Secondary | ICD-10-CM | POA: Diagnosis not present

## 2018-06-15 DIAGNOSIS — R29818 Other symptoms and signs involving the nervous system: Secondary | ICD-10-CM | POA: Diagnosis not present

## 2018-06-15 DIAGNOSIS — I672 Cerebral atherosclerosis: Secondary | ICD-10-CM | POA: Diagnosis present

## 2018-06-15 DIAGNOSIS — Z823 Family history of stroke: Secondary | ICD-10-CM | POA: Diagnosis not present

## 2018-06-15 DIAGNOSIS — Z881 Allergy status to other antibiotic agents status: Secondary | ICD-10-CM

## 2018-06-15 DIAGNOSIS — R29701 NIHSS score 1: Secondary | ICD-10-CM | POA: Diagnosis present

## 2018-06-15 DIAGNOSIS — Z818 Family history of other mental and behavioral disorders: Secondary | ICD-10-CM | POA: Diagnosis not present

## 2018-06-15 DIAGNOSIS — G2581 Restless legs syndrome: Secondary | ICD-10-CM | POA: Diagnosis present

## 2018-06-15 DIAGNOSIS — Z803 Family history of malignant neoplasm of breast: Secondary | ICD-10-CM | POA: Diagnosis not present

## 2018-06-15 DIAGNOSIS — K219 Gastro-esophageal reflux disease without esophagitis: Secondary | ICD-10-CM | POA: Diagnosis present

## 2018-06-15 DIAGNOSIS — R531 Weakness: Secondary | ICD-10-CM | POA: Diagnosis not present

## 2018-06-15 DIAGNOSIS — Z791 Long term (current) use of non-steroidal anti-inflammatories (NSAID): Secondary | ICD-10-CM

## 2018-06-15 DIAGNOSIS — Q2549 Other congenital malformations of aorta: Secondary | ICD-10-CM | POA: Diagnosis not present

## 2018-06-15 DIAGNOSIS — K589 Irritable bowel syndrome without diarrhea: Secondary | ICD-10-CM | POA: Diagnosis present

## 2018-06-15 DIAGNOSIS — I6381 Other cerebral infarction due to occlusion or stenosis of small artery: Secondary | ICD-10-CM | POA: Diagnosis present

## 2018-06-15 DIAGNOSIS — Z79899 Other long term (current) drug therapy: Secondary | ICD-10-CM

## 2018-06-15 DIAGNOSIS — R260 Ataxic gait: Secondary | ICD-10-CM | POA: Diagnosis present

## 2018-06-15 DIAGNOSIS — R7303 Prediabetes: Secondary | ICD-10-CM | POA: Diagnosis present

## 2018-06-15 DIAGNOSIS — G4700 Insomnia, unspecified: Secondary | ICD-10-CM | POA: Diagnosis not present

## 2018-06-15 DIAGNOSIS — K7689 Other specified diseases of liver: Secondary | ICD-10-CM | POA: Diagnosis present

## 2018-06-15 DIAGNOSIS — G8321 Monoplegia of upper limb affecting right dominant side: Secondary | ICD-10-CM | POA: Diagnosis present

## 2018-06-15 DIAGNOSIS — Z880 Allergy status to penicillin: Secondary | ICD-10-CM

## 2018-06-15 LAB — COMPREHENSIVE METABOLIC PANEL
ALT: 23 U/L (ref 0–44)
AST: 26 U/L (ref 15–41)
Albumin: 4.9 g/dL (ref 3.5–5.0)
Alkaline Phosphatase: 97 U/L (ref 38–126)
Anion gap: 8 (ref 5–15)
BUN: 14 mg/dL (ref 6–20)
CO2: 26 mmol/L (ref 22–32)
Calcium: 9.4 mg/dL (ref 8.9–10.3)
Chloride: 105 mmol/L (ref 98–111)
Creatinine, Ser: 0.66 mg/dL (ref 0.44–1.00)
GFR calc Af Amer: >60 mL/min (ref >60)
GFR calc non Af Amer: >60 mL/min (ref >60)
Glucose, Bld: 111 mg/dL — ABNORMAL HIGH (ref 70–99)
Potassium: 3.6 mmol/L (ref 3.5–5.1)
Sodium: 139 mmol/L (ref 135–145)
Total Bilirubin: 0.6 mg/dL (ref 0.3–1.2)
Total Protein: 8.1 g/dL (ref 6.5–8.1)

## 2018-06-15 LAB — DIFFERENTIAL
Abs Immature Granulocytes: 0.01 10*3/uL (ref 0.00–0.07)
Basophils Absolute: 0.1 10*3/uL (ref 0.0–0.1)
Basophils Relative: 1 %
Eosinophils Absolute: 0.1 10*3/uL (ref 0.0–0.5)
Eosinophils Relative: 2 %
Immature Granulocytes: 0 %
Lymphocytes Relative: 33 %
Lymphs Abs: 1.9 10*3/uL (ref 0.7–4.0)
Monocytes Absolute: 0.5 10*3/uL (ref 0.1–1.0)
Monocytes Relative: 9 %
Neutro Abs: 3.2 10*3/uL (ref 1.7–7.7)
Neutrophils Relative %: 55 %

## 2018-06-15 LAB — LIPID PANEL
Cholesterol: 211 mg/dL — ABNORMAL HIGH (ref 0–200)
HDL: 65 mg/dL (ref >40)
LDL Cholesterol: 134 mg/dL — ABNORMAL HIGH (ref 0–99)
Total CHOL/HDL Ratio: 3.2 RATIO
Triglycerides: 58 mg/dL (ref <150)
VLDL: 12 mg/dL (ref 0–40)

## 2018-06-15 LAB — CBC
HCT: 44.6 % (ref 36.0–46.0)
HCT: 42.7 % (ref 36.0–46.0)
Hemoglobin: 14.5 g/dL (ref 12.0–15.0)
Hemoglobin: 14.2 g/dL (ref 12.0–15.0)
MCH: 30 pg (ref 26.0–34.0)
MCH: 30.7 pg (ref 26.0–34.0)
MCHC: 32.5 g/dL (ref 30.0–36.0)
MCHC: 33.3 g/dL (ref 30.0–36.0)
MCV: 92.1 fL (ref 80.0–100.0)
MCV: 92.4 fL (ref 80.0–100.0)
Platelets: 192 10*3/uL (ref 150–400)
Platelets: 172 10*3/uL (ref 150–400)
RBC: 4.84 MIL/uL (ref 3.87–5.11)
RBC: 4.62 MIL/uL (ref 3.87–5.11)
RDW: 12.1 % (ref 11.5–15.5)
RDW: 12.2 % (ref 11.5–15.5)
WBC: 5.8 10*3/uL (ref 4.0–10.5)
WBC: 5.7 10*3/uL (ref 4.0–10.5)
nRBC: 0 % (ref 0.0–0.2)
nRBC: 0 % (ref 0.0–0.2)

## 2018-06-15 LAB — GLUCOSE, CAPILLARY
Glucose-Capillary: 78 mg/dL (ref 70–99)
Glucose-Capillary: 74 mg/dL (ref 70–99)
Glucose-Capillary: 99 mg/dL (ref 70–99)

## 2018-06-15 LAB — MAGNESIUM: Magnesium: 2.1 mg/dL (ref 1.7–2.4)

## 2018-06-15 LAB — APTT: aPTT: 35 seconds (ref 24–36)

## 2018-06-15 LAB — TROPONIN I
Troponin I: <0.03 ng/mL (ref <0.03)
Troponin I: <0.03 ng/mL (ref <0.03)
Troponin I: <0.03 ng/mL (ref <0.03)

## 2018-06-15 LAB — PROTIME-INR
INR: 0.92
Prothrombin Time: 12.3 seconds (ref 11.4–15.2)

## 2018-06-15 LAB — ECHOCARDIOGRAM COMPLETE
Height: 64.5 in
Weight: 2895.96 oz

## 2018-06-15 MED ORDER — LOSARTAN POTASSIUM 25 MG PO TABS
25.0000 mg | ORAL_TABLET | Freq: Every day | ORAL | Status: DC
  Administered 2018-06-15: 25 mg via ORAL
  Filled 2018-06-15 (×2): qty 1

## 2018-06-15 MED ORDER — SENNOSIDES-DOCUSATE SODIUM 8.6-50 MG PO TABS: 1.0000 | ORAL_TABLET | Freq: Every evening | ORAL | Status: DC | PRN

## 2018-06-15 MED ORDER — ACETAMINOPHEN 325 MG PO TABS
650.0000 mg | ORAL_TABLET | ORAL | Status: DC | PRN
  Administered 2018-06-15: 18:00:00 650 mg via ORAL
  Filled 2018-06-15: qty 2

## 2018-06-15 MED ORDER — INSULIN ASPART 100 UNIT/ML ~~LOC~~ SOLN: 0.0000 [IU] | Freq: Three times a day (TID) | SUBCUTANEOUS | Status: DC

## 2018-06-15 MED ORDER — IOHEXOL 350 MG/ML SOLN
75.0000 mL | Freq: Once | INTRAVENOUS | Status: AC | PRN
  Administered 2018-06-15: 75 mL via INTRAVENOUS

## 2018-06-15 MED ORDER — CELECOXIB 200 MG PO CAPS
200.0000 mg | ORAL_CAPSULE | Freq: Every evening | ORAL | Status: DC
  Administered 2018-06-15: 22:00:00 200 mg via ORAL
  Filled 2018-06-15 (×2): qty 1

## 2018-06-15 MED ORDER — STROKE: EARLY STAGES OF RECOVERY BOOK
Freq: Once | Status: AC
  Administered 2018-06-15: 15:00:00

## 2018-06-15 MED ORDER — ENOXAPARIN SODIUM 40 MG/0.4ML ~~LOC~~ SOLN
40.0000 mg | SUBCUTANEOUS | Status: DC
  Administered 2018-06-15: 40 mg via SUBCUTANEOUS
  Filled 2018-06-15: qty 0.4

## 2018-06-15 MED ORDER — PRAMIPEXOLE DIHYDROCHLORIDE 0.25 MG PO TABS
0.2500 mg | ORAL_TABLET | Freq: Every day | ORAL | Status: DC
  Administered 2018-06-15: 0.25 mg via ORAL
  Filled 2018-06-15: qty 1

## 2018-06-15 MED ORDER — AMITRIPTYLINE HCL 10 MG PO TABS
10.0000 mg | ORAL_TABLET | Freq: Every day | ORAL | Status: DC
  Administered 2018-06-15: 23:00:00 10 mg via ORAL
  Filled 2018-06-15 (×2): qty 1

## 2018-06-15 MED ORDER — ZOLPIDEM TARTRATE 5 MG PO TABS
5.0000 mg | ORAL_TABLET | Freq: Every evening | ORAL | Status: DC | PRN
  Administered 2018-06-16: 5 mg via ORAL
  Filled 2018-06-15: qty 1

## 2018-06-15 MED ORDER — POTASSIUM CHLORIDE CRYS ER 20 MEQ PO TBCR
20.0000 meq | EXTENDED_RELEASE_TABLET | Freq: Two times a day (BID) | ORAL | Status: DC
  Administered 2018-06-15: 20 meq via ORAL
  Filled 2018-06-15 (×2): qty 1

## 2018-06-15 MED ORDER — ROSUVASTATIN CALCIUM 10 MG PO TABS
5.0000 mg | ORAL_TABLET | Freq: Every day | ORAL | Status: DC
  Administered 2018-06-15: 23:00:00 5 mg via ORAL
  Filled 2018-06-15: qty 1

## 2018-06-15 MED ORDER — PANTOPRAZOLE SODIUM 40 MG PO TBEC
40.0000 mg | DELAYED_RELEASE_TABLET | Freq: Every day | ORAL | Status: DC
  Administered 2018-06-15: 40 mg via ORAL
  Filled 2018-06-15: qty 1

## 2018-06-15 MED ORDER — INSULIN ASPART 100 UNIT/ML ~~LOC~~ SOLN: 0.0000 [IU] | Freq: Every day | SUBCUTANEOUS | Status: DC

## 2018-06-15 MED ORDER — ACETAMINOPHEN 160 MG/5ML PO SOLN
650.0000 mg | ORAL | Status: DC | PRN
  Filled 2018-06-15: qty 20.3

## 2018-06-15 MED ORDER — ASPIRIN 81 MG PO CHEW
324.0000 mg | CHEWABLE_TABLET | Freq: Once | ORAL | Status: AC
  Administered 2018-06-15: 324 mg via ORAL
  Filled 2018-06-15: qty 4

## 2018-06-15 MED ORDER — ESTRADIOL-NORETHINDRONE ACET 0.5-0.1 MG PO TABS
1.0000 | ORAL_TABLET | Freq: Every day | ORAL | Status: DC
  Administered 2018-06-15: 1 via ORAL

## 2018-06-15 MED ORDER — SODIUM CHLORIDE 0.9% FLUSH: 3.0000 mL | Freq: Once | INTRAVENOUS | Status: DC

## 2018-06-15 MED ORDER — ASPIRIN EC 81 MG PO TBEC
81.0000 mg | DELAYED_RELEASE_TABLET | Freq: Every day | ORAL | Status: DC
  Filled 2018-06-15: qty 1

## 2018-06-15 MED ORDER — ACETAMINOPHEN 650 MG RE SUPP: 650.0000 mg | RECTAL | Status: DC | PRN

## 2018-06-15 NOTE — Progress Notes (Signed)
CODE STROKE- PHARMACY COMMUNICATION   Time CODE STROKE called/page received:@ 1100  Time response to CODE STROKE was made (in person or via phone): @ 11:13   Time Stroke Kit retrieved from [place] (only if needed):N/A  Name of Provider/Nurse contacted:Kassie, RN  Past Medical History:  Diagnosis Date  . Arthritis   . Depression   . GERD (gastroesophageal reflux disease)   . Headache    migraines  . Hypertension   . IBS (irritable bowel syndrome)   . Palpitations   . PONV (postoperative nausea and vomiting)   . Restless legs syndrome    Prior to Admission medications   Medication Sig Start Date End Date Taking? Authorizing Provider  amitriptyline (ELAVIL) 10 MG tablet Take 10 mg by mouth at bedtime.    Yes [provider]  celecoxib (CELEBREX) 200 MG capsule Take 200 mg by mouth every evening.    Yes [provider]  metoprolol succinate (TOPROL-XL) 25 MG 24 hr tablet Take 25 mg by mouth at bedtime.    Yes [provider]  MIMVEY LO 0.5-0.1 MG per tablet Take 1 tablet by mouth at bedtime.  10/22/14  Yes [provider]  pantoprazole (PROTONIX) 40 MG tablet Take 40 mg by mouth at bedtime.    Yes [provider]  pramipexole (MIRAPEX) 0.25 MG tablet Take 0.25 mg by mouth at bedtime.   Yes [provider]  zolpidem (AMBIEN CR) 6.25 MG CR tablet Take 6.25 mg by mouth at bedtime as needed for sleep.    Yes [provider]  benzonatate (TESSALON) 200 MG capsule Take 1 capsule (200 mg total) by mouth 3 (three) times daily as needed for cough (during the day while at work). Patient not taking: Reported on 08/25/2017 07/31/17   Versie Starks, PA-C  cephALEXin (KEFLEX) 500 MG capsule Take 1 capsule (500 mg total) by mouth 2 (two) times daily. 10/16/17   Flinchum, Kelby Aline, FNP  chlorpheniramine-HYDROcodone (TUSSIONEX PENNKINETIC ER) 10-8 MG/5ML SUER Take 5 mLs by mouth every 12 (twelve) hours as needed for cough. Patient not  taking: Reported on 08/25/2017 07/31/17   Caryn Section Linden Dolin, PA-C  HYDROcodone-homatropine [facility]) 5-1.5 MG/5ML syrup Take 5 mLs by mouth every 6 (six) hours as needed for cough. Patient not taking: Reported on 08/25/2017 07/23/17   Lattie Corns, PA-C  predniSONE (STERAPRED UNI-PAK 21 TAB) 10 MG (21) TBPK tablet By mouth Take 6 tablets on day 1, Take 5 tablets day 2 Take 4 tablets day 3 Take 3 tablets day 4 Take 2 tablets day five 5 Take 1 tablet day 10/16/17   Flinchum, Kelby Aline, FNP  aspirin EC 81 MG tablet Take 81 mg by mouth daily.  06/15/18  [provider]  Blood Glucose Monitoring Suppl (FREESTYLE LITE) DEVI  07/13/17 06/15/18  [provider]  cefdinir (OMNICEF) 300 MG capsule  07/28/17 06/15/18  [provider]  cholecalciferol (VITAMIN D) 1000 units tablet Take 1,000 Units by mouth daily.  06/15/18  [provider]  fluconazole (DIFLUCAN) 150 MG tablet  06/12/17 06/15/18  [provider]  FREESTYLE LITE test strip  07/13/17 06/15/18  [provider]  Lancets (FREESTYLE) lancets  07/13/17 06/15/18  [provider]  vitamin B-12 (CYANOCOBALAMIN) 1000 MCG tablet Take by mouth.  06/15/18  [provider]    Pernell Dupre, PharmD, BCPS Clinical Pharmacist 06/15/2018 12:24 PM

## 2018-06-15 NOTE — ED Triage Notes (Signed)
Patient arrives with c/o waking up today and feeling heaviness. Patient reports thinking it was related to medication. Patient states around 830 she felt heaviness overall. Patient reports feeling decreased sensation to right arm and leg.

## 2018-06-15 NOTE — Consult Note (Addendum)
Cardiology Consultation:   Patient ID: Lori Morales MRN: [id number]; DOB: 11-06-1960  Admit date: 06/15/2018 Date of Consult: 06/15/2018  Primary Care Provider: Adin Hector, MD Primary Cardiologist: [facility], Dr. Rockey Situ / Dr. Fletcher Anon Primary Electrophysiologist:  None    Patient Profile:   Lori Morales is a 58 y.o. female with a hx of hypertension, atypical chest pain, GERD, depression, lumbar disease with cervical spinal stenosis, and migraine headaches who is being seen today for the evaluation of possible CVA at the request of patient and Dr. Margaretmary Eddy.  History of Present Illness:   Lori Morales is a 58 year old female with PMH as above and includes HTN. Patient reported does not smoke, drink alcohol, or use any illegal drugs.  No previous cardiac history other than hypertension. 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 PTA medications included Toprol-XL 25 mg at bedtime daily for blood pressure. Family history significant for mother with CAD, CVA, and dementia. Her father had "sudden death before the age of 44," and her younger brother had a CVA. Of note, 07/18/2012 CT chest showed no focal abnormality in chest or mediastinum; however, it did note hepatic and left renal cysts.  Previously followed by Dr. Ubaldo Glassing of [facility] for intermittent and atypical chest pain.  (Unable to locate [facility] visit and notes on review of EMR). Since then, followed with Dr. Ramonita Lab of [facility] for ongoing chest pain, facial swelling, and HTN. On 07/31/2013, she underwent a myocardial perfusion study that showed no significant ischemia, no significant wall motion abnormality, and was overall ruled low risk scan with estimated EF 58% and no EKG changes concerning for ischemia.  She reportedly also had a Holter study completed per Dr. Caryl Comes (cannot locate in EMR ) that did not show arrhythmia but did indicate PACs and PVCs.  Patient did report that she was symptomatic with ectopy and that it always  increased with stress.  Per patient, Dr. Olin Pia recommended was that she make an appointment with Dr. Fletcher Anon for her high blood pressure, chronic chest pain, and symptomatic ectopy. However, she had not yet gotten around to it yet. Of note, patient stated she is otherwise very conscious of her family history, given her elevated blood pressure and atypical chest pain. She has had several MRI brain and CT head scans over the last several years, which have been negative up to the most recent 06/01/2016 MRI.   06/01/2016 MRI showed mild white matter lesions most consistent with chronic microvascular ischemia.  Most recent CT of the chest showed no acute abnormality.  Patient reportedly has been under a lot of stress for the past several months d/t her mother's dementia.  She stated that she has been trying to sell her mother's house and managing her children during this difficult time.  She stated that she does workout and referenced both [employer] and [employer].  She stated that she wears a Holter monitor during these workouts, and that her heart rate will reach up to 150 bpm.  She also reported that her blood pressure frequently is elevated during physician appointments; however, she does not take her blood pressure at home. She stated her stress was actually a little better this week, as she was off work and [place] from [place]13 through Wednesday 1/15.  Patient stated that she took 2 sleeping pills the night of 1/16, as she frequently experiences sleeping problems.  She reportedly does not sleep through the night,  and frequently will wake up every hour.  Today, 1/17, she was scheduled to return to work from vacation (1/17).  She stated that she woke up and looked at the alarm clock, but she was puzzled as the time appeared to say 9 AM, rather than 6 AM (when her alarm should have gone off).  In her, when she looked at the clock, it said 6 AM.  Despite this, she continued to get ready for the  day.  She texted to work to see if anyone had called out, and she noted difficulty moving her fingers while texting. She reported difficulty finding words as well. While in the shower, she mistakenly grabbed the conditioner instead of the shampoo.  She stated that she would have thought more of the symptoms; however, she performed a mini stroke test on herself, and did not note any symptoms consistent with stroke. She drove her car to the dealership before work, and noted that she was unable to drive in a straight line and instead veered to the right with other vehicles honking at her the whole ride to the dealership.  When she dropped off her car, she signed paperwork and noted her signature was not consistent with her usual signature. Nevertheless, she proceeded to work.  She reported feeling palpitations, but this was not unusual when under stress.  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  At this point, she decided to present for further evaluation.  She was seen at [facility] ED with complaint of feeling "not like herself," as well as right arm and leg "feeling heavier than normal."  She reported resolution in her difficulty finding words, occurring earlier in the morning as mentioned above. She denied headaches, current chest pain, or feeling of racing heart rate.  Associated symptoms included palpitations, as noted above.  Vital signs significant for elevated blood pressure 174/94.  Labs significant for negative troponin, glucose 111, Hemoglobin 14.2, K 3.6. Cr 0.66. EKG was NSR, QRS 101, 60 bpm. Code stroke was activated while in the ED but IV TPA not administered due to a low NIH stroke scale per Neurology documentation. CT showed possible left thalamic lacunar stroke. CT angio showed no large vessel occlusion but diffuse intracranial atherosclerosis. Per neurology, patient's thalamic  lacunar stroke is most consistent with that found due to elevated blood pressure.  Since her admission, the patient has had consistently elevated systolic blood pressure.  On review of telemetry, she has not had any arrhythmias.  Past Medical History:  Diagnosis Date  . Arthritis   . Depression   . GERD (gastroesophageal reflux disease)   . Headache    migraines  . Hypertension   . IBS (irritable bowel syndrome)   . Palpitations   . PONV (postoperative nausea and vomiting)   . Restless legs syndrome     Past Surgical History:  Procedure Laterality Date  . ABDOMINOPLASTY    . BACK SURGERY     1 laminectomy, 1 fusion  . COLONOSCOPY    . COLONOSCOPY WITH PROPOFOL N/A 01/17/2017   Procedure: COLONOSCOPY WITH PROPOFOL;  Surgeon: Lollie Sails, MD;  Location: [facility] ENDOSCOPY;  Service: Endoscopy;  Laterality: N/A;  . WISDOM TOOTH EXTRACTION       Home Medications:  Prior to Admission medications   Medication Sig Start Date End Date Taking? Authorizing Provider  amitriptyline (ELAVIL) 10 MG  tablet Take 10 mg by mouth at bedtime.    Yes [provider]  celecoxib (CELEBREX) 200 MG capsule Take 200 mg by mouth every evening.    Yes [provider]  metoprolol succinate (TOPROL-XL) 25 MG 24 hr tablet Take 25 mg by mouth at bedtime.    Yes [provider]  MIMVEY LO 0.5-0.1 MG per tablet Take 1 tablet by mouth at bedtime.  10/22/14  Yes [provider]  pantoprazole (PROTONIX) 40 MG tablet Take 40 mg by mouth at bedtime.    Yes [provider]  pramipexole (MIRAPEX) 0.25 MG tablet Take 0.25 mg by mouth at bedtime.   Yes [provider]  zolpidem (AMBIEN CR) 6.25 MG CR tablet Take 6.25 mg by mouth at bedtime as needed for sleep.    Yes [provider]  benzonatate (TESSALON) 200 MG capsule Take 1 capsule (200 mg total) by mouth 3 (three) times daily as needed for cough (during the day while at work). Patient not taking:  Reported on 08/25/2017 07/31/17   Versie Starks, PA-C  cephALEXin (KEFLEX) 500 MG capsule Take 1 capsule (500 mg total) by mouth 2 (two) times daily. 10/16/17   Flinchum, Kelby Aline, FNP  chlorpheniramine-HYDROcodone (TUSSIONEX PENNKINETIC ER) 10-8 MG/5ML SUER Take 5 mLs by mouth every 12 (twelve) hours as needed for cough. Patient not taking: Reported on 08/25/2017 07/31/17   Caryn Section Linden Dolin, PA-C  HYDROcodone-homatropine [facility]) 5-1.5 MG/5ML syrup Take 5 mLs by mouth every 6 (six) hours as needed for cough. Patient not taking: Reported on 08/25/2017 07/23/17   Lattie Corns, PA-C  predniSONE (STERAPRED UNI-PAK 21 TAB) 10 MG (21) TBPK tablet By mouth Take 6 tablets on day 1, Take 5 tablets day 2 Take 4 tablets day 3 Take 3 tablets day 4 Take 2 tablets day five 5 Take 1 tablet day 10/16/17   Flinchum, Kelby Aline, FNP    Inpatient Medications: Scheduled Meds: .  stroke: mapping our early stages of recovery book   Does not apply Once  . amitriptyline  10 mg Oral QHS  . [START ON 06/16/2018] aspirin EC  81 mg Oral Daily  . celecoxib  200 mg Oral QPM  . enoxaparin (LOVENOX) injection  40 mg Subcutaneous Q24H  . Estradiol-Norethindrone Acet  1 tablet Oral QHS  . insulin aspart  0-5 Units Subcutaneous QHS  . insulin aspart  0-9 Units Subcutaneous TID WC  . pantoprazole  40 mg Oral QHS  . pramipexole  0.25 mg Oral QHS  . sodium chloride flush  3 mL Intravenous Once   Continuous Infusions:  PRN Meds: acetaminophen **OR** acetaminophen (TYLENOL) oral liquid 160 mg/5 mL **OR** acetaminophen, senna-docusate, zolpidem  Allergies:    Allergies  Allergen Reactions  . Penicillins Hives and Rash  . Pristiq [Desvenlafaxine] Swelling and Other (See Comments)    Headaches (reaction to Pristiq)    Social History:   Social History   Socioeconomic History  . Marital status: Married    Spouse name: Not on file  . Number of children: Not on file  . Years of education: Not on file  . Highest  education level: Not on file  Occupational History  . Not on file  Social Needs  . Financial resource strain: Not on file  . Food insecurity:    Worry: Not on file    Inability: Not on file  . Transportation needs:    Medical: Not on file    Non-medical: Not on file  Tobacco Use  . Smoking status: Never Smoker  . Smokeless tobacco: Never Used  Substance and Sexual Activity  . Alcohol use: No  . Drug use: No  . Sexual activity: Not on file  Lifestyle  . Physical activity:    Days per week: Not on file    Minutes per session: Not on file  . Stress: Not on file  Relationships  . Social connections:    Talks on phone: Not on file    Gets together: Not on file    Attends religious service: Not on file    Active member of club or organization: Not on file    Attends meetings of clubs or organizations: Not on file    Relationship status: Not on file  . Intimate partner violence:    Fear of current or ex partner: Not on file    Emotionally abused: Not on file    Physically abused: Not on file    Forced sexual activity: Not on file  Other Topics Concern  . Not on file  Social History Narrative  . Not on file    Family History:    Family History  Problem Relation Age of Onset  . CAD Mother   . Stroke Mother   . Heart attack Father   . Breast cancer Maternal Aunt 23     ROS:  Please see the history of present illness.  Review of Systems  Constitutional: Positive for malaise/fatigue. Negative for weight loss.       Stress - mother with dementia, declining  Eyes: Negative for blurred vision.  Respiratory: Negative for cough.   Cardiovascular: Positive for chest pain and palpitations. Negative for orthopnea and leg swelling.       Intermittent, atypical and ongoing for years - CP Palpitations made worse with stress. Symptomatic. Did experience this AM on 1/17.  Gastrointestinal: Positive for vomiting. Negative for nausea.  Musculoskeletal: Negative for falls.    Neurological: Positive for sensory change, speech change, focal weakness and headaches. Negative for dizziness and loss of consciousness.       Heaviness of right upper extremity. Right sided deviation while driving. Chronic HA/migraines  Psychiatric/Behavioral: Negative for substance abuse.       Stress  All other systems reviewed and are negative.   All other ROS reviewed and negative.     Physical Exam/Data:   Vitals:   06/15/18 1330 06/15/18 1400 06/15/18 1430 06/15/18 1459  BP: (!) 163/76 (!) 151/76 140/82 (!) 153/76  Pulse: 68 66 65 (!) 58  Resp: 17 11 16 20   Temp:    97.6 F (36.4 C)  SpO2: 100% 98% 98% 99%  Weight:      Height:       No intake or output data in the 24 hours ending 06/15/18 1559 Filed Weights   06/15/18 1100  Weight: 82.1 kg   Body mass index is 30.59 kg/m.  General:  Well nourished, well developed, in no acute distress.  Joined by her husband HEENT: normal Neck: no JVD Vascular: No carotid bruits; FA pulses 2+ bilaterally without bruits.  Radial pulses 2+ Cardiac:  normal S1, S2; RRR; no murmur  Lungs:  clear to auscultation bilaterally, no wheezing, rhonchi or rales  Abd: soft, nontender, no hepatomegaly  Ext: no edema Musculoskeletal:  No deformities Skin: warm and dry  Neuro:   no focal abnormalities noted Psych:  Normal affect   EKG:  The EKG was personally reviewed and demonstrates:  SR, 60 bpm  Telemetry:  Telemetry was personally reviewed and demonstrates: NSR, 60 to 70 bpm  Relevant CV Studies: Pending echo Pending bubble study  08/06/2013 Myocardial perfusion scan Summary   1. Exercise myocardial perfusion study with no significant ischemia.   2. No significant wall motion abnormality noted.   3. Overall, this is a Low risk scan.   4. The estimated ejection fraction is 58%.   5. The left ventricular global function was normal.   6. There are no EKG changes concerning for ischemia.   7. There is no artifact noted on this  study.   Laboratory Data:  Chemistry Recent Labs  Lab 06/15/18 1119  NA 139  K 3.6  CL 105  CO2 26  GLUCOSE 111*  BUN 14  CREATININE 0.66  CALCIUM 9.4  GFRNONAA >60  GFRAA >60  ANIONGAP 8    Recent Labs  Lab 06/15/18 1119  PROT 8.1  ALBUMIN 4.9  AST 26  ALT 23  ALKPHOS 97  BILITOT 0.6   Hematology Recent Labs  Lab 06/15/18 1119  WBC 5.8  RBC 4.84  HGB 14.5  HCT 44.6  MCV 92.1  MCH 30.0  MCHC 32.5  RDW 12.1  PLT 192   Cardiac Enzymes Recent Labs  Lab 06/15/18 1119 06/15/18 1519  TROPONINI <0.03 <0.03   No results for input(s): TROPIPOC in the last 168 hours.  BNPNo results for input(s): BNP, PROBNP in the last 168 hours.  DDimer No results for input(s): DDIMER in the last 168 hours.  Radiology/Studies:  Ct Angio Head W Or Wo Contrast  Result Date: 06/15/2018 CLINICAL DATA:  Abnormal feeling after waking today. Weakness. Heaviness. Decreased sensation in the right arm and leg. EXAM: CT ANGIOGRAPHY HEAD AND NECK TECHNIQUE: Multidetector CT imaging of the head and neck was performed using the standard protocol during bolus administration of intravenous contrast. Multiplanar CT image reconstructions and MIPs were obtained to evaluate the vascular anatomy. Carotid stenosis measurements (when applicable) are obtained utilizing NASCET criteria, using the distal internal carotid diameter as the denominator. CONTRAST:  68mL OMNIPAQUE IOHEXOL 350 MG/ML SOLN COMPARISON:  Head CT same day FINDINGS: CTA NECK FINDINGS Aortic arch: Aortic atherosclerosis. 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 Right carotid system: Common carotid artery widely patent to the bifurcation region. Mild soft and calcified plaque at the ICA bulb. Minimal diameter is 4 mm, the same as the more distal cervical ICA. Therefore there is no stenosis. Upper cervical ICA is tortuous. Left carotid  system: Common carotid artery widely patent to the bifurcation region. Primarily soft plaque at the carotid bifurcation and ICA bulb. Minimal diameter 3.7 mm. Compared to a more distal cervical ICA diameter of 4 mm, this indicates a 10% stenosis. Upper cervical ICA is tortuous but widely patent. Vertebral arteries: Left vertebral artery arises directly from the arch. No origin stenosis. This vessel is widely patent through the cervical region to the foramen magnum. Right vertebral artery shows calcified plaque at its origin with stenosis estimated at 30%. Beyond that, the vessel is widely patent through the cervical region. Skeleton: Chronic spinal curvature. Chronic mid cervical degenerative changes. Other neck: No mass or lymphadenopathy. Upper chest: Normal Review of the MIP images confirms the above findings CTA HEAD FINDINGS Anterior circulation: Both internal carotid arteries are patent through the skull base and siphon regions. No siphon atherosclerotic calcification. The anterior and middle cerebral vessels are patent without proximal  stenosis, aneurysm or vascular malformation. Posterior circulation: Both vertebral arteries are widely patent to the basilar. No basilar stenosis. Posterior circulation branch vessels patent and unremarkable. More distal PCA branch vessels do show some atherosclerotic irregularity. Venous sinuses: Patent and normal. Anatomic variants: None significant. Delayed phase: No abnormal enhancement. Review of the MIP images confirms the above findings IMPRESSION: 1. 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 2. Atherosclerotic plaque at both carotid bifurcations. No stenosis on the right. 10% stenosis on the left. 3. 30% stenosis at the right vertebral artery origin. 4. No intracranial large or medium vessel occlusion or correctable proximal stenosis. 5. The patient  does have some smaller vessel atherosclerotic irregularity in the posterior cerebral artery branches. Aortic Atherosclerosis (ICD10-I70.0). Electronically Signed   By: Nelson Chimes M.D.   On: 06/15/2018 12:15   Ct Angio Neck W Or Wo Contrast  Result Date: 06/15/2018 CLINICAL DATA:  Abnormal feeling after waking today. Weakness. Heaviness. Decreased sensation in the right arm and leg. EXAM: CT ANGIOGRAPHY HEAD AND NECK TECHNIQUE: Multidetector CT imaging of the head and neck was performed using the standard protocol during bolus administration of intravenous contrast. Multiplanar CT image reconstructions and MIPs were obtained to evaluate the vascular anatomy. Carotid stenosis measurements (when applicable) are obtained utilizing NASCET criteria, using the distal internal carotid diameter as the denominator. CONTRAST:  65mL OMNIPAQUE IOHEXOL 350 MG/ML SOLN COMPARISON:  Head CT same day FINDINGS: CTA NECK FINDINGS Aortic arch: Aortic atherosclerosis. 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 Right carotid system: Common carotid artery widely patent to the bifurcation region. Mild soft and calcified plaque at the ICA bulb. Minimal diameter is 4 mm, the same as the more distal cervical ICA. Therefore there is no stenosis. Upper cervical ICA is tortuous. Left carotid system: Common carotid artery widely patent to the bifurcation region. Primarily soft plaque at the carotid bifurcation and ICA bulb. Minimal diameter 3.7 mm. Compared to a more distal cervical ICA diameter of 4 mm, this indicates a 10% stenosis. Upper cervical ICA is tortuous but widely patent. Vertebral arteries: Left vertebral artery arises directly from the arch. No origin stenosis. This vessel is widely patent through the cervical region to the foramen magnum. Right vertebral artery shows calcified plaque at its origin with stenosis estimated at  30%. Beyond that, the vessel is widely patent through the cervical region. Skeleton: Chronic spinal curvature. Chronic mid cervical degenerative changes. Other neck: No mass or lymphadenopathy. Upper chest: Normal Review of the MIP images confirms the above findings CTA HEAD FINDINGS Anterior circulation: Both internal carotid arteries are patent through the skull base and siphon regions. No siphon atherosclerotic calcification. The anterior and middle cerebral vessels are patent without proximal stenosis, aneurysm or vascular malformation. Posterior circulation: Both vertebral arteries are widely patent to the basilar. No basilar stenosis. Posterior circulation branch vessels patent and unremarkable. More distal PCA branch vessels do show some atherosclerotic irregularity. Venous sinuses: Patent and normal. Anatomic variants: None significant. Delayed phase: No abnormal enhancement. Review of the MIP images confirms the above findings IMPRESSION: 1. 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 2. Atherosclerotic plaque at both carotid bifurcations. No stenosis on the right. 10% stenosis on  the left. 3. 30% stenosis at the right vertebral artery origin. 4. No intracranial large or medium vessel occlusion or correctable proximal stenosis. 5. The patient does have some smaller vessel atherosclerotic irregularity in the posterior cerebral artery branches. Aortic Atherosclerosis (ICD10-I70.0). Electronically Signed   By: Nelson Chimes M.D.   On: 06/15/2018 12:15   Mr Brain Wo Contrast  Result Date: 06/15/2018 CLINICAL DATA:  Acute presentation with decreased sensation of the right arm and leg and weakness. EXAM: MRI HEAD WITHOUT CONTRAST TECHNIQUE: Multiplanar, multiecho pulse sequences of the brain and surrounding structures were obtained without intravenous contrast. COMPARISON:  CT studies same  day. FINDINGS: Brain: 9 mm acute infarction of the left thalamus. No other acute infarction. No swelling or hemorrhage. Elsewhere, brainstem and cerebellum are normal. There are mild chronic small-vessel ischemic changes of the hemispheric deep white matter. No cortical or large vessel territory infarction. No mass lesion, hydrocephalus or extra-axial collection. Vascular: Major vessels at the base of the brain show flow. 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 Electronically Signed   By: Nelson Chimes M.D.   On: 06/15/2018 12:58   Ct Head Code Stroke Wo Contrast  Result Date: 06/15/2018 CLINICAL DATA:  Code stroke. Right arm weakness starting this morning EXAM: CT HEAD WITHOUT CONTRAST TECHNIQUE: Contiguous axial images were obtained from the base of the skull through the vertex without intravenous contrast. COMPARISON:  Brain MRI 06/01/2016 FINDINGS: Brain: Low-density in the left thalamus, new and indistinct. No evidence of cortical infarct. No hemorrhage, hydrocephalus, or masslike finding Vascular: No hyperdense vessel or unexpected calcification. Skull: Negative Sinuses/Orbits: No acute finding Other: These results were called by telephone at the time of interpretation on 06/15/2018 at 11:10 am to present neurologist (Dr. Corky Downs was examining the patient) who verbally acknowledged these results. ASPECTS [facility] Stroke Program Early CT Score) Not scored given the above IMPRESSION: 1. 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 2. No intracranial hemorrhage. Electronically Signed   By: Monte Fantasia M.D.   On: 06/15/2018 11:13    Assessment and Plan:   CVA  - Discussed with neurology. Imaging shows infarct most consistent with stroke due to elevated blood pressure. Given CVA, neurology recommending we exercise caution with rate of reduction of her SBP.  Patient has had consistently elevated blood  pressure since admission. PTA Toprol XL.  --Previously seen by Dr. Ubaldo Glassing cardiology for intermittent atypical CP with 2015 NM study completed as above in CV studies and low risk. Holter study completed showing ectopy but no arrhythmias (not in EMR). -Pending updated lipid panel, hemoglobin A1c, TSH.  Troponin still cycling. Daily BMET to monitor renal function and electrolytes.  Cr 0.66, potassium 3.6 with recommendation to replete as below.   -Started on statin. Discussed importance of maintaining goal LDL below 70 in the future.  Also discussed possibly obtaining CAC scan in [place], based on CT scan here showing carotid stenosis.  -Continue newly initiated ASA, Crestor, and Losartan.  Baseline liver function within limits on start of statin and pending baseline lipid panel.  Monitor vitals and telemetry. Will exercise caution with rate of reduction reduction of blood pressure with possible restart of Toprol before discharge if needed.   --Echo results pending.  Will also need bubble study completed to assess for PFO.    HTN -  Currently followed by Dr. Caryl Comes for HTN. PTA medications included Toprol XL, currently on hold.  As above,  patient does not take blood pressure at home leading up to admission.  She does report stress, lack of sleep, and frequently elevated blood pressure at doctor's visits. 05/2016 MRI showing small vessel changes as in HPI before CVA this admission.   -Continue ASA, losartan, and Crestor.  Will reassess restarting Toprol-XL before discharge if needed for additional BP control and with consideration of neurology recommendations.  Will plan to schedule for outpatient follow-up with Dr. Fletcher Anon at discharge. Will also schedule follow-up outpatient BMET and lipid panel after starting on ARB and Crestor. Discussed with patient the importance of starting to monitor her blood pressure at home.  Also discussed obtaining an apple watch to monitor her heart rate at home.  Hypokalemia - K  3.6. Replete with goal 4.0.   - Check magnesium and if needed replete with goal 2.0.  Daily BMET.  For questions or updates, please contact [place] Please consult [email or web address] for contact info under     Signed, Arvil Chaco, PA-C  06/15/2018 3:59 PM

## 2018-06-15 NOTE — ED Notes (Signed)
Pt transported to room 116.

## 2018-06-15 NOTE — ED Notes (Signed)
Patient transported to MRI 
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
## 2018-06-15 NOTE — ED Notes (Signed)
ED TO INPATIENT HANDOFF REPORT  Name/Age/Gender Lori Morales 58 y.o. female  Code Status    Code Status Orders  (From admission, onward)         Start     Ordered   06/15/18 1351  Full code  Continuous     06/15/18 1352        Code Status History    This patient has a current code status but no historical code status.    Advance Directive Documentation     Most Recent Value  Type of Advance Directive  Living will  Pre-existing out of facility DNR order (yellow form or pink MOST form)  -  "MOST" Form in Place?  -      Home/SNF/Other Home  Chief Complaint numbness  Level of Care/Admitting Diagnosis ED Disposition    ED Disposition Condition [place]: [facility] [[id number]]  Level of Care: Med-Surg [16]  Diagnosis: Acute CVA (cerebrovascular accident) [facility]) [[id number]]  Admitting Physician: Nicholes Mango [[id number]]  Attending Physician: Nicholes Mango [[id number]]  Estimated length of stay: past midnight tomorrow  Certification:: I certify this patient will need inpatient services for at least 2 midnights  Bed request comments: 1c  PT Class (Do Not Modify): Inpatient [101]  PT Acc Code (Do Not Modify): Private [1]       Medical History Past Medical History:  Diagnosis Date  . Arthritis   . Depression   . GERD (gastroesophageal reflux disease)   . Headache    migraines  . Hypertension   . IBS (irritable bowel syndrome)   . Palpitations   . PONV (postoperative nausea and vomiting)   . Restless legs syndrome     Allergies Allergies  Allergen Reactions  . Penicillins Hives and Rash  . Pristiq [Desvenlafaxine] Swelling and Other (See Comments)    Headaches (reaction to Pristiq)    IV Location/Drains/Wounds Patient Lines/Drains/Airways Status   Active Line/Drains/Airways    Name:   Placement date:   Placement time:   Site:   Days:   Peripheral IV Left Antecubital   -    1118    Antecubital      Airway   01/17/17     0816     514   Incision 03/14/12 Lumbar   03/14/12    0835     2284   Incision (Closed) 01/01/15 Shoulder Right   01/01/15    1050     1261          Labs/Imaging Results for orders placed or performed during the hospital encounter of 06/15/18 (from the past 48 hour(s))  Glucose, capillary     Status: None   Collection Time: 06/15/18 10:57 AM  Result Value Ref Range   Glucose-Capillary 78 70 - 99 mg/dL  Protime-INR     Status: None   Collection Time: 06/15/18 11:19 AM  Result Value Ref Range   Prothrombin Time 12.3 11.4 - 15.2 seconds   INR 0.92     Comment: Performed at [facility], [address] [place], [place] [address]  APTT     Status: None   Collection Time: 06/15/18 11:19 AM  Result Value Ref Range   aPTT 35 24 - 36 seconds    Comment: Performed at [facility], [address] [place], [place] [address]  CBC     Status: None   Collection Time: 06/15/18 11:19 AM  Result Value Ref Range   WBC  5.8 4.0 - 10.5 K/uL   RBC 4.84 3.87 - 5.11 MIL/uL   Hemoglobin 14.5 12.0 - 15.0 g/dL   HCT 44.6 36.0 - 46.0 %   MCV 92.1 80.0 - 100.0 fL   MCH 30.0 26.0 - 34.0 pg   MCHC 32.5 30.0 - 36.0 g/dL   RDW 12.1 11.5 - 15.5 %   Platelets 192 150 - 400 K/uL   nRBC 0.0 0.0 - 0.2 %    Comment: Performed at [facility], [address] [place], [place] [address]  Differential     Status: None   Collection Time: 06/15/18 11:19 AM  Result Value Ref Range   Neutrophils Relative % 55 %   Neutro Abs 3.2 1.7 - 7.7 K/uL   Lymphocytes Relative 33 %   Lymphs Abs 1.9 0.7 - 4.0 K/uL   Monocytes Relative 9 %   Monocytes Absolute 0.5 0.1 - 1.0 K/uL   Eosinophils Relative 2 %   Eosinophils Absolute 0.1 0.0 - 0.5 K/uL   Basophils Relative 1 %   Basophils Absolute 0.1 0.0 - 0.1 K/uL   Immature Granulocytes 0 %   Abs Immature Granulocytes 0.01 0.00 - 0.07 K/uL    Comment: Performed at [facility], [address] [place], [place] [address]   Comprehensive metabolic panel     Status: Abnormal   Collection Time: 06/15/18 11:19 AM  Result Value Ref Range   Sodium 139 135 - 145 mmol/L   Potassium 3.6 3.5 - 5.1 mmol/L   Chloride 105 98 - 111 mmol/L   CO2 26 22 - 32 mmol/L   Glucose, Bld 111 (H) 70 - 99 mg/dL   BUN 14 6 - 20 mg/dL   Creatinine, Ser 0.66 0.44 - 1.00 mg/dL   Calcium 9.4 8.9 - 10.3 mg/dL   Total Protein 8.1 6.5 - 8.1 g/dL   Albumin 4.9 3.5 - 5.0 g/dL   AST 26 15 - 41 U/L   ALT 23 0 - 44 U/L   Alkaline Phosphatase 97 38 - 126 U/L   Total Bilirubin 0.6 0.3 - 1.2 mg/dL   GFR calc non Af Amer >60 >60 mL/min   GFR calc Af Amer >60 >60 mL/min   Anion gap 8 5 - 15    Comment: Performed at [facility], [address] [place],  [address]  Troponin I - Once     Status: None   Collection Time: 06/15/18 11:19 AM  Result Value Ref Range   Troponin I <0.03 <0.03 ng/mL    Comment: Performed at [facility], [address] [place],  [address]   Ct Angio Head W Or Wo Contrast  Result Date: 06/15/2018 CLINICAL DATA:  Abnormal feeling after waking today. Weakness. Heaviness. Decreased sensation in the right arm and leg. EXAM: CT ANGIOGRAPHY HEAD AND NECK TECHNIQUE: Multidetector CT imaging of the head and neck was performed using the standard protocol during bolus administration of intravenous contrast. Multiplanar CT image reconstructions and MIPs were obtained to evaluate the vascular anatomy. Carotid stenosis measurements (when applicable) are obtained utilizing NASCET criteria, using the distal internal carotid diameter as the denominator. CONTRAST:  76mL OMNIPAQUE IOHEXOL 350 MG/ML SOLN COMPARISON:  Head CT same day FINDINGS: CTA NECK FINDINGS Aortic arch: Aortic atherosclerosis. 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 Right carotid system: Common carotid artery widely patent to the  bifurcation region. Mild soft  and calcified plaque at the ICA bulb. Minimal diameter is 4 mm, the same as the more distal cervical ICA. Therefore there is no stenosis. Upper cervical ICA is tortuous. Left carotid system: Common carotid artery widely patent to the bifurcation region. Primarily soft plaque at the carotid bifurcation and ICA bulb. Minimal diameter 3.7 mm. Compared to a more distal cervical ICA diameter of 4 mm, this indicates a 10% stenosis. Upper cervical ICA is tortuous but widely patent. Vertebral arteries: Left vertebral artery arises directly from the arch. No origin stenosis. This vessel is widely patent through the cervical region to the foramen magnum. Right vertebral artery shows calcified plaque at its origin with stenosis estimated at 30%. Beyond that, the vessel is widely patent through the cervical region. Skeleton: Chronic spinal curvature. Chronic mid cervical degenerative changes. Other neck: No mass or lymphadenopathy. Upper chest: Normal Review of the MIP images confirms the above findings CTA HEAD FINDINGS Anterior circulation: Both internal carotid arteries are patent through the skull base and siphon regions. No siphon atherosclerotic calcification. The anterior and middle cerebral vessels are patent without proximal stenosis, aneurysm or vascular malformation. Posterior circulation: Both vertebral arteries are widely patent to the basilar. No basilar stenosis. Posterior circulation branch vessels patent and unremarkable. More distal PCA branch vessels do show some atherosclerotic irregularity. Venous sinuses: Patent and normal. Anatomic variants: None significant. Delayed phase: No abnormal enhancement. Review of the MIP images confirms the above findings IMPRESSION: 1. 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 2. Atherosclerotic plaque at both carotid  bifurcations. No stenosis on the right. 10% stenosis on the left. 3. 30% stenosis at the right vertebral artery origin. 4. No intracranial large or medium vessel occlusion or correctable proximal stenosis. 5. The patient does have some smaller vessel atherosclerotic irregularity in the posterior cerebral artery branches. Aortic Atherosclerosis (ICD10-I70.0). Electronically Signed   By: Nelson Chimes M.D.   On: 06/15/2018 12:15   Ct Angio Neck W Or Wo Contrast  Result Date: 06/15/2018 CLINICAL DATA:  Abnormal feeling after waking today. Weakness. Heaviness. Decreased sensation in the right arm and leg. EXAM: CT ANGIOGRAPHY HEAD AND NECK TECHNIQUE: Multidetector CT imaging of the head and neck was performed using the standard protocol during bolus administration of intravenous contrast. Multiplanar CT image reconstructions and MIPs were obtained to evaluate the vascular anatomy. Carotid stenosis measurements (when applicable) are obtained utilizing NASCET criteria, using the distal internal carotid diameter as the denominator. CONTRAST:  24mL OMNIPAQUE IOHEXOL 350 MG/ML SOLN COMPARISON:  Head CT same day FINDINGS: CTA NECK FINDINGS Aortic arch: Aortic atherosclerosis. 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 Right carotid system: Common carotid artery widely patent to the bifurcation region. Mild soft and calcified plaque at the ICA bulb. Minimal diameter is 4 mm, the same as the more distal cervical ICA. Therefore there is no stenosis. Upper cervical ICA is tortuous. Left carotid system: Common carotid artery widely patent to the bifurcation region. Primarily soft plaque at the carotid bifurcation and ICA bulb. Minimal diameter 3.7 mm. Compared to a more distal cervical ICA diameter of 4 mm, this indicates a 10% stenosis. Upper cervical ICA is tortuous but widely patent. Vertebral arteries: Left vertebral artery  arises directly from the arch. No origin stenosis. This vessel is widely patent through the cervical region  to the foramen magnum. Right vertebral artery shows calcified plaque at its origin with stenosis estimated at 30%. Beyond that, the vessel is widely patent through the cervical region. Skeleton: Chronic spinal curvature. Chronic mid cervical degenerative changes. Other neck: No mass or lymphadenopathy. Upper chest: Normal Review of the MIP images confirms the above findings CTA HEAD FINDINGS Anterior circulation: Both internal carotid arteries are patent through the skull base and siphon regions. No siphon atherosclerotic calcification. The anterior and middle cerebral vessels are patent without proximal stenosis, aneurysm or vascular malformation. Posterior circulation: Both vertebral arteries are widely patent to the basilar. No basilar stenosis. Posterior circulation branch vessels patent and unremarkable. More distal PCA branch vessels do show some atherosclerotic irregularity. Venous sinuses: Patent and normal. Anatomic variants: None significant. Delayed phase: No abnormal enhancement. Review of the MIP images confirms the above findings IMPRESSION: 1. 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 2. Atherosclerotic plaque at both carotid bifurcations. No stenosis on the right. 10% stenosis on the left. 3. 30% stenosis at the right vertebral artery origin. 4. No intracranial large or medium vessel occlusion or correctable proximal stenosis. 5. The patient does have some smaller vessel atherosclerotic irregularity in the posterior cerebral artery branches. Aortic Atherosclerosis (ICD10-I70.0). Electronically Signed   By: Nelson Chimes M.D.   On: 06/15/2018 12:15   Mr Brain Wo Contrast  Result Date: 06/15/2018 CLINICAL DATA:  Acute presentation with decreased sensation of the right arm  and leg and weakness. EXAM: MRI HEAD WITHOUT CONTRAST TECHNIQUE: Multiplanar, multiecho pulse sequences of the brain and surrounding structures were obtained without intravenous contrast. COMPARISON:  CT studies same day. FINDINGS: Brain: 9 mm acute infarction of the left thalamus. No other acute infarction. No swelling or hemorrhage. Elsewhere, brainstem and cerebellum are normal. There are mild chronic small-vessel ischemic changes of the hemispheric deep white matter. No cortical or large vessel territory infarction. No mass lesion, hydrocephalus or extra-axial collection. Vascular: Major vessels at the base of the brain show flow. 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 Electronically Signed   By: Nelson Chimes M.D.   On: 06/15/2018 12:58   Ct Head Code Stroke Wo Contrast  Result Date: 06/15/2018 CLINICAL DATA:  Code stroke. Right arm weakness starting this morning EXAM: CT HEAD WITHOUT CONTRAST TECHNIQUE: Contiguous axial images were obtained from the base of the skull through the vertex without intravenous contrast. COMPARISON:  Brain MRI 06/01/2016 FINDINGS: Brain: Low-density in the left thalamus, new and indistinct. No evidence of cortical infarct. No hemorrhage, hydrocephalus, or masslike finding Vascular: No hyperdense vessel or unexpected calcification. Skull: Negative Sinuses/Orbits: No acute finding Other: These results were called by telephone at the time of interpretation on 06/15/2018 at 11:10 am to present neurologist (Dr. Corky Downs was examining the patient) who verbally acknowledged these results. ASPECTS [facility] Stroke Program Early CT Score) Not scored given the above IMPRESSION: 1. 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 2. No intracranial hemorrhage. Electronically Signed   By: Monte Fantasia M.D.   On: 06/15/2018 11:13    Pending Labs Unresulted Labs (From admission, onward)     Start     Ordered   06/22/18 0500  Creatinine, serum  (enoxaparin (LOVENOX)    CrCl >/= 30 ml/min)  Weekly,   STAT    Comments:  while on enoxaparin  therapy    06/15/18 1352   06/16/18 0500  Lipid panel  Tomorrow morning,   STAT    Comments:  Fasting    06/15/18 1352   06/16/18 0500  Hemoglobin A1c  Tomorrow morning,   STAT     06/15/18 1352   06/16/18 0500  TSH  Tomorrow morning,   STAT     06/15/18 1409   06/15/18 1350  HIV antibody (Routine Testing)  Once,   STAT     06/15/18 1352   06/15/18 1350  CBC  (enoxaparin (LOVENOX)    CrCl >/= 30 ml/min)  Once,   STAT    Comments:  Baseline for enoxaparin therapy IF NOT ALREADY DRAWN.  Notify MD if PLT < 100 K.    06/15/18 1352   06/15/18 1350  Creatinine, serum  (enoxaparin (LOVENOX)    CrCl >/= 30 ml/min)  Once,   STAT    Comments:  Baseline for enoxaparin therapy IF NOT ALREADY DRAWN.    06/15/18 1352   06/15/18 1344  Troponin I - Now Then Q6H  Now then every 6 hours,   STAT     06/15/18 1343          Vitals/Pain Today's Vitals   06/15/18 1120 06/15/18 1211 06/15/18 1330 06/15/18 1400  BP:  (!) 165/81 (!) 163/76 (!) 151/76  Pulse:  63 68 66  Resp:  11 17 11   SpO2:  100% 100% 98%  Weight:      Height: 5' 4.5" (1.638 m)     PainSc:        Isolation Precautions No active isolations  Medications Medications  sodium chloride flush (NS) 0.9 % injection 3 mL ( Intravenous Canceled Entry 06/15/18 1125)   stroke: mapping our early stages of recovery book (has no administration in time range)  acetaminophen (TYLENOL) tablet 650 mg (has no administration in time range)    Or  acetaminophen (TYLENOL) solution 650 mg (has no administration in time range)    Or  acetaminophen (TYLENOL) suppository 650 mg (has no administration in time range)  senna-docusate (Senokot-S) tablet 1 tablet (has no administration in time range)  enoxaparin (LOVENOX) injection 40 mg (has no administration in time range)  aspirin EC tablet 81 mg (has  no administration in time range)  insulin aspart (novoLOG) injection 0-9 Units (has no administration in time range)  insulin aspart (novoLOG) injection 0-5 Units (has no administration in time range)  aspirin chewable tablet 324 mg (324 mg Oral Given 06/15/18 1141)  iohexol (OMNIPAQUE) 350 MG/ML injection 75 mL (75 mLs Intravenous Contrast Given 06/15/18 1148)    Mobility walks

## 2018-06-15 NOTE — ED Provider Notes (Signed)
[facility] Emergency Department Provider Note  ____________________________________________  Time seen: Approximately 11:29 AM  I have reviewed the triage vital signs and the nursing notes.   HISTORY  Chief Complaint Code Stroke and Numbness   HPI Lori Morales is a 58 y.o. female with a history of migraine headaches and hypertension who presents for evaluation of right-sided numbness.  Patient reports that she woke up this morning feeling off.  She feels that her right arm and leg are heavier than normal.  She had difficulty driving and felt like she kept deviating to the right while driving. She also noted some difficulty finding words which has now resolved. She denies any prior history of stroke or fh of stroke. She denies HA, CP, facial droop, slurred speech. Her symptoms have constant and moderate in intensity. No history of smoking.    Past Medical History:  Diagnosis Date  . Arthritis   . Depression   . GERD (gastroesophageal reflux disease)   . Headache    migraines  . Hypertension   . IBS (irritable bowel syndrome)   . Palpitations   . PONV (postoperative nausea and vomiting)   . Restless legs syndrome     Patient Active Problem List   Diagnosis Date Noted  . Depression 10/16/2017  . Migraine 10/16/2017  . Hyperglycemia, unspecified 07/10/2017  . H/O dizziness 08/02/2016  . Cervical disc disorder with radiculopathy of cervical region 04/12/2016  . Nonallopathic lesion of thoracic region 04/12/2016  . Nonallopathic lesion of rib cage 04/12/2016  . Facet arthritis of cervical region 03/21/2016  . Hyperlipidemia 01/26/2015  . Labral tear of shoulder, right, subsequent encounter 12/04/2014  . 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 Impingement syndrome of right shoulder 05/28/2014  . Right supraspinatus tenosynovitis 05/28/2014  . Arthritis pain 04/03/2014  . Fatigue 04/03/2014  . 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 IBS  (irritable bowel syndrome) 06/20/2012  . Lumbar disc disease 06/20/2012    Past Surgical History:  Procedure Laterality Date  . ABDOMINOPLASTY    . BACK SURGERY     1 laminectomy, 1 fusion  . COLONOSCOPY    . COLONOSCOPY WITH PROPOFOL N/A 01/17/2017   Procedure: COLONOSCOPY WITH PROPOFOL;  Surgeon: Lollie Sails, MD;  Location: [facility] ENDOSCOPY;  Service: Endoscopy;  Laterality: N/A;  . WISDOM TOOTH EXTRACTION      Prior to Admission medications   Medication Sig Start Date End Date Taking? Authorizing Provider  amitriptyline (ELAVIL) 10 MG tablet Take 10 mg by mouth at bedtime.    Yes [provider]  celecoxib (CELEBREX) 200 MG capsule Take 200 mg by mouth every evening.    Yes [provider]  metoprolol succinate (TOPROL-XL) 25 MG 24 hr tablet Take 25 mg by mouth at bedtime.    Yes [provider]  MIMVEY LO 0.5-0.1 MG per tablet Take 1 tablet by mouth at bedtime.  10/22/14  Yes [provider]  pantoprazole (PROTONIX) 40 MG tablet Take 40 mg by mouth at bedtime.    Yes [provider]  pramipexole (MIRAPEX) 0.25 MG tablet Take 0.25 mg by mouth at bedtime.   Yes [provider]  zolpidem (AMBIEN CR) 6.25 MG CR tablet Take 6.25 mg by mouth at bedtime as needed for sleep.    Yes [provider]  benzonatate (TESSALON) 200 MG capsule Take 1 capsule (200 mg total) by mouth 3 (three) times daily as needed for cough (during the day while at work). Patient not  taking: Reported on 08/25/2017 07/31/17   Versie Starks, PA-C  cephALEXin (KEFLEX) 500 MG capsule Take 1 capsule (500 mg total) by mouth 2 (two) times daily. 10/16/17   Flinchum, Kelby Aline, FNP  chlorpheniramine-HYDROcodone (TUSSIONEX PENNKINETIC ER) 10-8 MG/5ML SUER Take 5 mLs by mouth every 12 (twelve) hours as needed for cough. Patient not taking: Reported on 08/25/2017 07/31/17   Caryn Section Linden Dolin, PA-C  HYDROcodone-homatropine [facility]) 5-1.5 MG/5ML syrup Take 5 mLs by  mouth every 6 (six) hours as needed for cough. Patient not taking: Reported on 08/25/2017 07/23/17   Lattie Corns, PA-C  predniSONE (STERAPRED UNI-PAK 21 TAB) 10 MG (21) TBPK tablet By mouth Take 6 tablets on day 1, Take 5 tablets day 2 Take 4 tablets day 3 Take 3 tablets day 4 Take 2 tablets day five 5 Take 1 tablet day 10/16/17   Flinchum, Kelby Aline, FNP    Allergies Penicillins and Pristiq [desvenlafaxine]  Family History  Problem Relation Age of Onset  . CAD Mother   . Stroke Mother   . Heart attack Father   . Breast cancer Maternal Aunt 53    Social History Social History   Tobacco Use  . Smoking status: Never Smoker  . Smokeless tobacco: Never Used  Substance Use Topics  . Alcohol use: No  . Drug use: No    Review of Systems  Constitutional: Negative for fever. Eyes: Negative for visual changes. ENT: Negative for sore throat. Neck: No neck pain  Cardiovascular: Negative for chest pain. Respiratory: Negative for shortness of breath. Gastrointestinal: Negative for abdominal pain, vomiting or diarrhea. Genitourinary: Negative for dysuria. Musculoskeletal: Negative for back pain. Skin: Negative for rash. Neurological: Negative for headaches. + R sided heaviness and difficulty finding words Psych: No SI or HI  ____________________________________________   PHYSICAL EXAM:  VITAL SIGNS: ED Triage Vitals  Enc Vitals Group     BP 06/15/18 1100 (!) 174/94     Pulse Rate 06/15/18 1108 62     Resp 06/15/18 1108 16     Temp --      Temp src --      SpO2 06/15/18 1108 100 %     Weight 06/15/18 1100 181 lb (82.1 kg)     Height 06/15/18 1120 5' 4.5" (1.638 m)     Head Circumference --      Peak Flow --      Pain Score 06/15/18 1119 0     Pain Loc --      Pain Edu? --      Excl. in [place]? --     Constitutional: Alert and oriented. Well appearing and in no apparent distress. HEENT:      Head: Normocephalic and atraumatic.         Eyes: Conjunctivae are  normal. Sclera is non-icteric.       Mouth/Throat: Mucous membranes are moist.       Neck: Supple with no signs of meningismus. Cardiovascular: Regular rate and rhythm. No murmurs, gallops, or rubs. 2+ symmetrical distal pulses are present in all extremities. No JVD. Respiratory: Normal respiratory effort. Lungs are clear to auscultation bilaterally. No wheezes, crackles, or rhonchi.  Gastrointestinal: Soft, non tender, and non distended with positive bowel sounds. No rebound or guarding. Musculoskeletal: Nontender with normal range of motion in all extremities. No edema, cyanosis, or erythema of extremities. Neurologic: A & O x3, PERRL, EOMI, no nystagmus, CN II-XII intact, motor testing reveals good tone and bulk throughout. There is no  evidence of pronator drift or dysmetria. Muscle strength is 5/5 throughout. Deep tendon reflexes are 2+ throughout with downgoing toes. Sensory examination shows mild decreased sensation to touch but no extinction on the RU and RLE. Gait deferred Skin: Skin is warm, dry and intact. No rash noted. Psychiatric: Mood and affect are normal. Speech and behavior are normal.  ____________________________________________   LABS (all labs ordered are listed, but only abnormal results are displayed)  Labs Reviewed  COMPREHENSIVE METABOLIC PANEL - Abnormal; Notable for the following components:      Result Value   Glucose, Bld 111 (*)    All other components within normal limits  PROTIME-INR  APTT  CBC  DIFFERENTIAL  TROPONIN I  GLUCOSE, CAPILLARY  CBG MONITORING, ED   ____________________________________________  EKG  ED ECG REPORT I, Rudene Re, the attending physician, personally viewed and interpreted this ECG.  Normal sinus rhythm, rate of 60, normal intervals, normal axis, minimal ST depressions in inferior leads with no ST elevations.  Unchanged from prior ____________________________________________  RADIOLOGY  I have personally reviewed  the images performed during this visit and I agree with the Radiologist's read.   Interpretation by Radiologist:  Ct Angio Head W Or Wo Contrast  Result Date: 06/15/2018 CLINICAL DATA:  Abnormal feeling after waking today. Weakness. Heaviness. Decreased sensation in the right arm and leg. EXAM: CT ANGIOGRAPHY HEAD AND NECK TECHNIQUE: Multidetector CT imaging of the head and neck was performed using the standard protocol during bolus administration of intravenous contrast. Multiplanar CT image reconstructions and MIPs were obtained to evaluate the vascular anatomy. Carotid stenosis measurements (when applicable) are obtained utilizing NASCET criteria, using the distal internal carotid diameter as the denominator. CONTRAST:  64mL OMNIPAQUE IOHEXOL 350 MG/ML SOLN COMPARISON:  Head CT same day FINDINGS: CTA NECK FINDINGS Aortic arch: Aortic atherosclerosis. 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 Right carotid system: Common carotid artery widely patent to the bifurcation region. Mild soft and calcified plaque at the ICA bulb. Minimal diameter is 4 mm, the same as the more distal cervical ICA. Therefore there is no stenosis. Upper cervical ICA is tortuous. Left carotid system: Common carotid artery widely patent to the bifurcation region. Primarily soft plaque at the carotid bifurcation and ICA bulb. Minimal diameter 3.7 mm. Compared to a more distal cervical ICA diameter of 4 mm, this indicates a 10% stenosis. Upper cervical ICA is tortuous but widely patent. Vertebral arteries: Left vertebral artery arises directly from the arch. No origin stenosis. This vessel is widely patent through the cervical region to the foramen magnum. Right vertebral artery shows calcified plaque at its origin with stenosis estimated at 30%. Beyond that, the vessel is widely patent through the cervical region. Skeleton: Chronic  spinal curvature. Chronic mid cervical degenerative changes. Other neck: No mass or lymphadenopathy. Upper chest: Normal Review of the MIP images confirms the above findings CTA HEAD FINDINGS Anterior circulation: Both internal carotid arteries are patent through the skull base and siphon regions. No siphon atherosclerotic calcification. The anterior and middle cerebral vessels are patent without proximal stenosis, aneurysm or vascular malformation. Posterior circulation: Both vertebral arteries are widely patent to the basilar. No basilar stenosis. Posterior circulation branch vessels patent and unremarkable. More distal PCA branch vessels do show some atherosclerotic irregularity. Venous sinuses: Patent and normal. Anatomic variants: None significant. Delayed phase: No abnormal enhancement. Review of the MIP images confirms the above findings IMPRESSION:  1. 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 2. Atherosclerotic plaque at both carotid bifurcations. No stenosis on the right. 10% stenosis on the left. 3. 30% stenosis at the right vertebral artery origin. 4. No intracranial large or medium vessel occlusion or correctable proximal stenosis. 5. The patient does have some smaller vessel atherosclerotic irregularity in the posterior cerebral artery branches. Aortic Atherosclerosis (ICD10-I70.0). Electronically Signed   By: Nelson Chimes M.D.   On: 06/15/2018 12:15   Ct Angio Neck W Or Wo Contrast  Result Date: 06/15/2018 CLINICAL DATA:  Abnormal feeling after waking today. Weakness. Heaviness. Decreased sensation in the right arm and leg. EXAM: CT ANGIOGRAPHY HEAD AND NECK TECHNIQUE: Multidetector CT imaging of the head and neck was performed using the standard protocol during bolus administration of intravenous contrast. Multiplanar CT image reconstructions and MIPs were obtained to evaluate  the vascular anatomy. Carotid stenosis measurements (when applicable) are obtained utilizing NASCET criteria, using the distal internal carotid diameter as the denominator. CONTRAST:  29mL OMNIPAQUE IOHEXOL 350 MG/ML SOLN COMPARISON:  Head CT same day FINDINGS: CTA NECK FINDINGS Aortic arch: Aortic atherosclerosis. 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 Right carotid system: Common carotid artery widely patent to the bifurcation region. Mild soft and calcified plaque at the ICA bulb. Minimal diameter is 4 mm, the same as the more distal cervical ICA. Therefore there is no stenosis. Upper cervical ICA is tortuous. Left carotid system: Common carotid artery widely patent to the bifurcation region. Primarily soft plaque at the carotid bifurcation and ICA bulb. Minimal diameter 3.7 mm. Compared to a more distal cervical ICA diameter of 4 mm, this indicates a 10% stenosis. Upper cervical ICA is tortuous but widely patent. Vertebral arteries: Left vertebral artery arises directly from the arch. No origin stenosis. This vessel is widely patent through the cervical region to the foramen magnum. Right vertebral artery shows calcified plaque at its origin with stenosis estimated at 30%. Beyond that, the vessel is widely patent through the cervical region. Skeleton: Chronic spinal curvature. Chronic mid cervical degenerative changes. Other neck: No mass or lymphadenopathy. Upper chest: Normal Review of the MIP images confirms the above findings CTA HEAD FINDINGS Anterior circulation: Both internal carotid arteries are patent through the skull base and siphon regions. No siphon atherosclerotic calcification. The anterior and middle cerebral vessels are patent without proximal stenosis, aneurysm or vascular malformation. Posterior circulation: Both vertebral arteries are widely patent to the basilar. No basilar stenosis.  Posterior circulation branch vessels patent and unremarkable. More distal PCA branch vessels do show some atherosclerotic irregularity. Venous sinuses: Patent and normal. Anatomic variants: None significant. Delayed phase: No abnormal enhancement. Review of the MIP images confirms the above findings IMPRESSION: 1. 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 2. Atherosclerotic plaque at both carotid bifurcations. No stenosis on the right. 10% stenosis on the left. 3. 30% stenosis at the right vertebral artery origin. 4. No intracranial large or medium vessel occlusion or correctable proximal stenosis. 5. The patient does have some smaller vessel atherosclerotic irregularity in the posterior cerebral artery branches. Aortic Atherosclerosis (ICD10-I70.0). Electronically Signed   By: Nelson Chimes M.D.   On: 06/15/2018 12:15   Ct Head  Code Stroke Wo Contrast  Result Date: 06/15/2018 CLINICAL DATA:  Code stroke. Right arm weakness starting this morning EXAM: CT HEAD WITHOUT CONTRAST TECHNIQUE: Contiguous axial images were obtained from the base of the skull through the vertex without intravenous contrast. COMPARISON:  Brain MRI 06/01/2016 FINDINGS: Brain: Low-density in the left thalamus, new and indistinct. No evidence of cortical infarct. No hemorrhage, hydrocephalus, or masslike finding Vascular: No hyperdense vessel or unexpected calcification. Skull: Negative Sinuses/Orbits: No acute finding Other: These results were called by telephone at the time of interpretation on 06/15/2018 at 11:10 am to present neurologist (Dr. Corky Downs was examining the patient) who verbally acknowledged these results. ASPECTS [facility] Stroke Program Early CT Score) Not scored given the above IMPRESSION: 1. 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 2. No intracranial hemorrhage. Electronically Signed   By: Monte Fantasia M.D.   On: 06/15/2018 11:13     ____________________________________________   PROCEDURES  Procedure(s) performed: None Procedures Critical Care performed:  None ____________________________________________   INITIAL IMPRESSION / ASSESSMENT AND PLAN / ED COURSE  58 y.o. female with a history of migraine headaches and hypertension who presents for evaluation of right-sided heaviness and difficulty finding words. Patient was made a code stroke on arrival and evaluated by in house neurologist with NHISS of 1. CT concerning for possible L thalamic acute stroke. No indication for tPA. ASA given. CTA ordered by neurologist. Plan to admit for stroke evaluation.    _________________________ 12:30 PM on 06/15/2018 -----------------------------------------  CTA showing no proximal large vessel occlusion. Will discuss with the Hospitalist for admission.   As part of my medical decision making, I reviewed the following data within the [facility] notes reviewed and incorporated, Labs reviewed , EKG interpreted , Old EKG reviewed, Old chart reviewed, Radiograph reviewed , Discussed with admitting physician , A consult was requested and obtained from this/these consultant(s) Neurology, Notes from prior ED visits and [place] Controlled Substance Database    Pertinent labs & imaging results that were available during my care of the patient were reviewed by me and considered in my medical decision making (see chart for details).    ____________________________________________   FINAL CLINICAL IMPRESSION(S) / ED DIAGNOSES  Final diagnoses:  Acute ischemic stroke ([place])      NEW MEDICATIONS STARTED DURING THIS VISIT:  ED Discharge Orders    None       Note:  This document was prepared using Dragon voice recognition software and may include unintentional dictation errors.    Alfred Levins, [place], MD 06/15/18 [phone]

## 2018-06-15 NOTE — H&P (Signed)
[place] [facility] [place] NAME: Lori Morales    MR#:  [id number]  DATE OF BIRTH:  04/18/1961  DATE OF ADMISSION:  06/15/2018  PRIMARY CARE PHYSICIAN: Adin Hector, MD   REQUESTING/REFERRING PHYSICIAN: Rudene Re, MD  CHIEF COMPLAINT:  Right arm heaviness and code stroke  HISTORY OF PRESENT ILLNESS:  Lori Morales  is a 58 y.o. female with a known history of essential hypertension, migraine headache under a lot of stress for the past several days has presented to the ED with right-sided heaviness and for scratchy handwriting.  Patient is in her usual state of health until today morning and while coming to the work had difficulty while driving as she kept deviating on the lane while driving.  Came to the work and noticed scratchy handwriting and a right arm heaviness.  Informed to her colleague and code stroke was called.  CT head has revealed acute stroke and MRI confirmed 9 mm left thalamic stroke.  Patient was evaluated by neurologist who has recommended baby aspirin on daily basis.  Patient has passed bedside swallow evaluation and reporting chest pressure.  She actually has an appointment with Dr. Fletcher Anon soon adding the same.  Husband at bedside. Reports patient is borderline diabetic and follows carb modified diet  PAST MEDICAL HISTORY:   Past Medical History:  Diagnosis Date  . Arthritis   . Depression   . GERD (gastroesophageal reflux disease)   . Headache    migraines  . Hypertension   . IBS (irritable bowel syndrome)   . Palpitations   . PONV (postoperative nausea and vomiting)   . Restless legs syndrome     PAST SURGICAL HISTOIRY:   Past Surgical History:  Procedure Laterality Date  . ABDOMINOPLASTY    . BACK SURGERY     1 laminectomy, 1 fusion  . COLONOSCOPY    . COLONOSCOPY WITH PROPOFOL N/A 01/17/2017   Procedure: COLONOSCOPY WITH PROPOFOL;  Surgeon: Lollie Sails, MD;  Location: [facility] ENDOSCOPY;   Service: Endoscopy;  Laterality: N/A;  . WISDOM TOOTH EXTRACTION      SOCIAL HISTORY:   Social History   Tobacco Use  . Smoking status: Never Smoker  . Smokeless tobacco: Never Used  Substance Use Topics  . Alcohol use: No    FAMILY HISTORY:   Family History  Problem Relation Age of Onset  . CAD Mother   . Stroke Mother   . Heart attack Father   . Breast cancer Maternal Aunt 53    DRUG ALLERGIES:   Allergies  Allergen Reactions  . Penicillins Hives and Rash  . Pristiq [Desvenlafaxine] Swelling and Other (See Comments)    Headaches (reaction to Pristiq)    REVIEW OF SYSTEMS:  CONSTITUTIONAL: No fever, fatigue or weakness.  EYES: No blurred or double vision.  EARS, NOSE, AND THROAT: No tinnitus or ear pain.  RESPIRATORY: No cough, shortness of breath, wheezing or hemoptysis.  CARDIOVASCULAR: No chest pain, orthopnea, edema.  GASTROINTESTINAL: No nausea, vomiting, diarrhea or abdominal pain.  GENITOURINARY: No dysuria, hematuria.  ENDOCRINE: No polyuria, nocturia,  HEMATOLOGY: No anemia, easy bruising or bleeding SKIN: No rash or lesion. MUSCULOSKELETAL: No joint pain or arthritis.   NEUROLOGIC: Right upper extremity heaviness and scratchy handwriting, no tingling, numbness, weakness.  PSYCHIATRY: No anxiety or depression.   MEDICATIONS AT HOME:   Prior to Admission medications   Medication Sig Start Date End Date Taking? Authorizing Provider  amitriptyline (ELAVIL) 10 MG  tablet Take 10 mg by mouth at bedtime.    Yes [provider]  celecoxib (CELEBREX) 200 MG capsule Take 200 mg by mouth every evening.    Yes [provider]  metoprolol succinate (TOPROL-XL) 25 MG 24 hr tablet Take 25 mg by mouth at bedtime.    Yes [provider]  MIMVEY LO 0.5-0.1 MG per tablet Take 1 tablet by mouth at bedtime.  10/22/14  Yes [provider]  pantoprazole (PROTONIX) 40 MG tablet Take 40 mg by mouth at bedtime.    Yes [provider]  pramipexole (MIRAPEX) 0.25 MG tablet Take 0.25 mg by mouth at bedtime.   Yes [provider]  zolpidem (AMBIEN CR) 6.25 MG CR tablet Take 6.25 mg by mouth at bedtime as needed for sleep.    Yes [provider]  benzonatate (TESSALON) 200 MG capsule Take 1 capsule (200 mg total) by mouth 3 (three) times daily as needed for cough (during the day while at work). Patient not taking: Reported on 08/25/2017 07/31/17   Versie Starks, PA-C  cephALEXin (KEFLEX) 500 MG capsule Take 1 capsule (500 mg total) by mouth 2 (two) times daily. 10/16/17   Flinchum, Kelby Aline, FNP  chlorpheniramine-HYDROcodone (TUSSIONEX PENNKINETIC ER) 10-8 MG/5ML SUER Take 5 mLs by mouth every 12 (twelve) hours as needed for cough. Patient not taking: Reported on 08/25/2017 07/31/17   Caryn Section Linden Dolin, PA-C  HYDROcodone-homatropine [facility]) 5-1.5 MG/5ML syrup Take 5 mLs by mouth every 6 (six) hours as needed for cough. Patient not taking: Reported on 08/25/2017 07/23/17   Lattie Corns, PA-C  predniSONE (STERAPRED UNI-PAK 21 TAB) 10 MG (21) TBPK tablet By mouth Take 6 tablets on day 1, Take 5 tablets day 2 Take 4 tablets day 3 Take 3 tablets day 4 Take 2 tablets day five 5 Take 1 tablet day 10/16/17   Flinchum, Kelby Aline, FNP      VITAL SIGNS:  Blood pressure (!) 163/76, pulse 68, resp. rate 17, height 5' 4.5" (1.638 m), weight 82.1 kg, last menstrual period 02/13/2012, SpO2 100 %.  PHYSICAL EXAMINATION:  GENERAL:  58 y.o.-year-old patient lying in the bed with no acute distress.  EYES: Pupils equal, round, reactive to light and accommodation. No scleral icterus. Extraocular muscles intact.  HEENT: Head atraumatic, normocephalic. Oropharynx and nasopharynx clear.  NECK:  Supple, no jugular venous distention. No thyroid enlargement, no tenderness.  LUNGS: Normal breath sounds bilaterally, no wheezing, rales,rhonchi or crepitation. No use of accessory muscles of respiration.  CARDIOVASCULAR: S1, S2 normal.  No murmurs, rubs, or gallops.  ABDOMEN: Soft, nontender, nondistended. Bowel sounds present. No organomegaly or mass.  EXTREMITIES: No pedal edema, cyanosis, or clubbing.  NEUROLOGIC: Cranial nerves II through XII are intact. Muscle strength 5/5 in all extremities. Sensation intact. Gait not checked. Hyper reflexia PSYCHIATRIC: The patient is alert and oriented x 3.  SKIN: No obvious rash, lesion, or ulcer.   LABORATORY PANEL:   CBC Recent Labs  Lab 06/15/18 1119  WBC 5.8  HGB 14.5  HCT 44.6  PLT 192   ------------------------------------------------------------------------------------------------------------------  Chemistries  Recent Labs  Lab 06/15/18 1119  NA 139  K 3.6  CL 105  CO2 26  GLUCOSE 111*  BUN 14  CREATININE 0.66  CALCIUM 9.4  AST 26  ALT 23  ALKPHOS 97  BILITOT 0.6   ------------------------------------------------------------------------------------------------------------------  Cardiac Enzymes Recent Labs  Lab 06/15/18 1119  TROPONINI <0.03   ------------------------------------------------------------------------------------------------------------------  RADIOLOGY:  Ct Angio Head  W Or Wo Contrast  Result Date: 06/15/2018 CLINICAL DATA:  Abnormal feeling after waking today. Weakness. Heaviness. Decreased sensation in the right arm and leg. EXAM: CT ANGIOGRAPHY HEAD AND NECK TECHNIQUE: Multidetector CT imaging of the head and neck was performed using the standard protocol during bolus administration of intravenous contrast. Multiplanar CT image reconstructions and MIPs were obtained to evaluate the vascular anatomy. Carotid stenosis measurements (when applicable) are obtained utilizing NASCET criteria, using the distal internal carotid diameter as the denominator. CONTRAST:  40mL OMNIPAQUE IOHEXOL 350 MG/ML SOLN COMPARISON:  Head CT same day FINDINGS: CTA NECK FINDINGS Aortic arch: Aortic atherosclerosis. 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 Left vertebral artery arises directly from the arch. Right carotid system: Common carotid artery widely patent to the bifurcation region. Mild soft and calcified plaque at the ICA bulb. Minimal diameter is 4 mm, the same as the more distal cervical ICA. Therefore there is no stenosis. Upper cervical ICA is tortuous. Left carotid system: Common carotid artery widely patent to the bifurcation region. Primarily soft plaque at the carotid bifurcation and ICA bulb. Minimal diameter 3.7 mm. Compared to a more distal cervical ICA diameter of 4 mm, this indicates a 10% stenosis. Upper cervical ICA is tortuous but widely patent. Vertebral arteries: Left vertebral artery arises directly from the arch. No origin stenosis. This vessel is widely patent through the cervical region to the foramen magnum. Right vertebral artery shows calcified plaque at its origin with stenosis estimated at 30%. Beyond that, the vessel is widely patent through the cervical region. Skeleton: Chronic spinal curvature. Chronic mid cervical degenerative changes. Other neck: No mass or lymphadenopathy. Upper chest: Normal Review of the MIP images confirms the above findings CTA HEAD FINDINGS Anterior circulation: Both internal carotid arteries are patent through the skull base and siphon regions. No siphon atherosclerotic calcification. The anterior and middle cerebral vessels are patent without proximal stenosis, aneurysm or vascular malformation. Posterior circulation: Both vertebral arteries are widely patent to the basilar. No basilar stenosis. Posterior circulation branch vessels patent and unremarkable. More distal PCA branch vessels do show some atherosclerotic irregularity. Venous sinuses: Patent and normal. Anatomic variants: None significant. Delayed phase: No abnormal enhancement. Review of the MIP images confirms the above findings IMPRESSION: 1. 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 Left vertebral artery arises directly from the arch. 2. Atherosclerotic plaque at both carotid bifurcations. No stenosis on the right. 10% stenosis on the left. 3. 30% stenosis at the right vertebral artery origin. 4. No intracranial large or medium vessel occlusion or correctable proximal stenosis. 5. The patient does have some smaller vessel atherosclerotic irregularity in the posterior cerebral artery branches. Aortic Atherosclerosis (ICD10-I70.0). Electronically Signed   By: Nelson Chimes M.D.   On: 06/15/2018 12:15   Ct Angio Neck W Or Wo Contrast  Result Date: 06/15/2018 CLINICAL DATA:  Abnormal feeling after waking today. Weakness. Heaviness. Decreased sensation in the right arm and leg. EXAM: CT ANGIOGRAPHY HEAD AND NECK TECHNIQUE: Multidetector CT imaging of the head and neck was performed using the standard protocol during bolus administration of intravenous contrast. Multiplanar CT image reconstructions and MIPs were obtained to evaluate the vascular anatomy. Carotid stenosis measurements (when applicable) are obtained utilizing NASCET criteria, using the distal internal carotid diameter as the denominator. CONTRAST:  91mL OMNIPAQUE IOHEXOL 350 MG/ML SOLN COMPARISON:  Head CT same day FINDINGS: CTA NECK  FINDINGS Aortic arch: Aortic atherosclerosis. 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 Right carotid system: Common carotid artery widely patent to the bifurcation region. Mild soft and calcified plaque at the ICA bulb. Minimal diameter is 4 mm, the same as the more distal cervical ICA. Therefore there is no stenosis. Upper cervical ICA is tortuous. Left carotid system: Common carotid artery widely patent to the bifurcation region. Primarily soft plaque at the carotid bifurcation and ICA bulb. Minimal diameter 3.7 mm. Compared to a more  distal cervical ICA diameter of 4 mm, this indicates a 10% stenosis. Upper cervical ICA is tortuous but widely patent. Vertebral arteries: Left vertebral artery arises directly from the arch. No origin stenosis. This vessel is widely patent through the cervical region to the foramen magnum. Right vertebral artery shows calcified plaque at its origin with stenosis estimated at 30%. Beyond that, the vessel is widely patent through the cervical region. Skeleton: Chronic spinal curvature. Chronic mid cervical degenerative changes. Other neck: No mass or lymphadenopathy. Upper chest: Normal Review of the MIP images confirms the above findings CTA HEAD FINDINGS Anterior circulation: Both internal carotid arteries are patent through the skull base and siphon regions. No siphon atherosclerotic calcification. The anterior and middle cerebral vessels are patent without proximal stenosis, aneurysm or vascular malformation. Posterior circulation: Both vertebral arteries are widely patent to the basilar. No basilar stenosis. Posterior circulation branch vessels patent and unremarkable. More distal PCA branch vessels do show some atherosclerotic irregularity. Venous sinuses: Patent and normal. Anatomic variants: None significant. Delayed phase: No abnormal enhancement. Review of the MIP images confirms the above findings IMPRESSION: 1. 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 2. Atherosclerotic plaque at both carotid bifurcations. No stenosis on the right. 10% stenosis on the left. 3. 30% stenosis at the right vertebral artery origin. 4. No intracranial large or medium vessel occlusion or correctable proximal stenosis. 5. The patient does have some smaller vessel atherosclerotic irregularity in the posterior cerebral artery branches. Aortic Atherosclerosis (ICD10-I70.0). Electronically Signed   By: Nelson Chimes M.D.   On: 06/15/2018 12:15   Mr Brain Wo Contrast  Result Date: 06/15/2018 CLINICAL DATA:  Acute presentation with decreased sensation of the right arm and leg and weakness. EXAM: MRI HEAD WITHOUT CONTRAST TECHNIQUE: Multiplanar, multiecho pulse sequences of the brain and surrounding structures were obtained without intravenous contrast. COMPARISON:  CT studies same day. FINDINGS: Brain: 9 mm acute infarction of the left thalamus. No other acute infarction. No swelling or hemorrhage. Elsewhere, brainstem and cerebellum are normal. There are mild chronic small-vessel ischemic changes of the hemispheric deep white matter. No cortical or large vessel territory infarction. No mass lesion, hydrocephalus or extra-axial collection. Vascular: Major vessels at the base of the brain show flow. 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 Electronically Signed   By: Nelson Chimes M.D.   On: 06/15/2018 12:58   Ct Head Code Stroke Wo Contrast  Result Date: 06/15/2018 CLINICAL DATA:  Code stroke. Right arm weakness starting this morning EXAM: CT HEAD WITHOUT CONTRAST TECHNIQUE: Contiguous axial images were obtained from the base of the skull through the vertex without intravenous contrast. COMPARISON:  Brain MRI 06/01/2016 FINDINGS: Brain: Low-density in the left thalamus, new and indistinct.  No evidence of cortical infarct. No hemorrhage, hydrocephalus, or masslike finding Vascular: No hyperdense vessel or unexpected calcification. Skull: Negative Sinuses/Orbits: No acute finding Other: These results were called by telephone at the time of interpretation on 06/15/2018 at 11:10 am to present neurologist (Dr. Corky Downs was examining the patient) who verbally acknowledged these results. ASPECTS [facility] Stroke Program Early CT Score) Not scored given the above IMPRESSION: 1. 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 2. No  intracranial hemorrhage. Electronically Signed   By: Monte Fantasia M.D.   On: 06/15/2018 11:13    EKG:   Orders placed or performed during the hospital encounter of 06/15/18  . ED EKG  . ED EKG    IMPRESSION AND PLAN:    #Acute left thalamic stroke Admit to MedSurg unit CT head has revealed a stroke and MRI confirmed 9 mm left thalamic stroke CT angiogram of the head and neck are done no acute findings We will get echocardiogram Seen and evaluated by neurology Dr. Paschal Dopp Patient is started on baby aspirin 81 mg enteric-coated which need to be continued.  Aspirin 325 mg dose given today in the ED Patient has passed bedside swallow evaluation, start heart healthy diet Check TSH fasting lipid panel and A1c Speech therapy, PT and OT evaluation We will allow permissive hypertension for the first 24-48h - will treat PRN if SBP >220 mmHg. Blood pressures can be gradually normalized to SBP<140 upon discharge Statin Therapy - LDL goal < 70  Neurochecks  #Essential hypertension blood pressure elevated Will allow permissive hypertension today as documented above and need tight control of her blood pressure. Holding home medication metoprolol  #Chest pressure Monitor patient on telemetry Cycle troponins Echocardiogram ordered Cardiology consult placed to Dr. Rockey Situ and text message sent via haiku chat  #Borderline diabetes mellitus Check hemoglobin A1c and provide sliding scale insulin  #GERD PPI  #Insomnia Ambien nightly as needed   All the records are reviewed and case discussed with ED provider. Management plans discussed with the patient, family and they are in agreement.  CODE STATUS: fc   TOTAL TIME TAKING CARE OF THIS PATIENT: 45 minutes.   Note: This dictation was prepared with Dragon dictation along with smaller phrase technology. Any transcriptional errors that result from this process are unintentional.  Nicholes Mango M.D on 06/15/2018 at 1:58 PM  Between 7am to  6pm - Pager - [phone]  After 6pm go to [email or web address] - password [email or web address] [facility]  [facility]  Office  [phone]  CC: Primary care physician; Adin Hector, MD

## 2018-06-15 NOTE — Consult Note (Signed)
Referring Physician: Dr. Alfred Levins    Reason for Consult: R numbness   HPI: Lori Morales is an 58 y.o. female with past medical history significant for hypertension, migraines who woke up this morning feeling off.  Patient stated that she woke up this morning feeling little bit unusual, she drove to work, but around 8:30 AM she was feeling little bit numb on the right side that include right upper extremity right lower extremity but not the face, patient reported that she while she was driving she was drifting to the right, when she arrived at work she decided to come to the emergency room where stroke code was called.  Patient denied any slurred speech, no visual changes, no focal motor weakness however she felt that her right-sided was heavier, patient denies any shortness of breath, she denied any chest pain, no fevers. Patient reported that she does not take aspirin, she is not on any anticoagulation.  In the emergency room her systolic blood pressure was in the 170, her CT scan of the brain shows possible left lacunar thalamic stroke, CT angiogram showed no large vessel occlusion but multiple intracranial atherosclerosis. IV tPA was not given due to the fact that NIH stroke scale was low with left upper and lower extremity numbness and CT scan of the brain already showed a possible left thalamic lacunar stroke patient was admitted to the hospital for stroke work-up.   Date last known well: 06/15/2018 Time last known well: 20 AM   tPA Given: no due to low NIHss 1  Past Medical History Past Medical History:  Diagnosis Date  . Arthritis   . Depression   . GERD (gastroesophageal reflux disease)   . Headache    migraines  . Hypertension   . IBS (irritable bowel syndrome)   . Palpitations   . PONV (postoperative nausea and vomiting)   . Restless legs syndrome     Surgical History Past Surgical History:  Procedure Laterality Date  . ABDOMINOPLASTY    . BACK SURGERY     1  laminectomy, 1 fusion  . COLONOSCOPY    . COLONOSCOPY WITH PROPOFOL N/A 01/17/2017   Procedure: COLONOSCOPY WITH PROPOFOL;  Surgeon: Lollie Sails, MD;  Location: [facility] ENDOSCOPY;  Service: Endoscopy;  Laterality: N/A;  . WISDOM TOOTH EXTRACTION      Family History  Family History  Problem Relation Age of Onset  . CAD Mother   . Stroke Mother   . Heart attack Father   . Breast cancer Maternal Aunt 53    Social History:   reports that she has never smoked. She has never used smokeless tobacco. She reports that she does not drink alcohol or use drugs.  Allergies:  Allergies  Allergen Reactions  . Penicillins Hives and Rash  . Pristiq [Desvenlafaxine] Swelling and Other (See Comments)    Headaches (reaction to Pristiq)    Home Medications:  (Not in a hospital admission)   Hospital Medications . sodium chloride flush  3 mL Intravenous Once    ROS: Negative except of what is reported in HPI   Physical Examination:  Vitals:   06/15/18 1100 06/15/18 1108 06/15/18 1120 06/15/18 1211  BP: (!) 174/94 (!) 166/93  (!) 165/81  Pulse:  62  63  Resp:  16  11  SpO2:  100%  100%  Weight: 82.1 kg     Height:   5' 4.5" (1.638 m)     General -  Heart - Regular rate and rhythm -  no murmer appreciated Lungs - Clear to auscultation  Abdomen - Soft - non tender Extremities - Distal pulses intact - no edema Skin - Warm and dry   Neurologic Examination: NIHSS 1 Mental Status: Alert, oriented, thought content appropriate.  Speech fluent without evidence of aphasia. Able to follow 3 step commands without difficulty. Cranial Nerves: II: Discs not visualized; Visual fields grossly normal, pupils equal, round, reactive to light III,IV, VI: ptosis not present, extra-ocular motions intact bilaterally V,VII: smile symmetric, facial light touch sensation normal bilaterally VIII: hearing normal bilaterally XI: bilateral shoulder shrug XII: midline tongue extension Motor: RUE -  5/5    LUE - 5/5   RLE - 5/5    LLE - 5/5 Tone and bulk:normal tone throughout; no atrophy noted Sensory: Decreased on RUE/RLE Deep Tendon Reflexes: 2+ and symmetric throughout Cerebellar: normal finger-to-nose and normal heel-to-shin test Gait: deferred at this time.   NIHSS 1a Level of Conscious: 1b LOC Questions:  1c LOC Commands:  2 Best Gaze:  3 Visual:  4 Facial Palsy:  5a Motor Arm - left:  5b Motor Arm - Right:  6a Motor Leg - Left:  6b Motor Leg - Right:  7 Limb Ataxia:  8 Sensory: 1 9 Best Language:  10 Dysarthria: 11 Extinct. and Inattention: TOTAL:    LABORATORY STUDIES:  Basic Metabolic Panel: Recent Labs  Lab 06/15/18 1119  NA 139  K 3.6  CL 105  CO2 26  GLUCOSE 111*  BUN 14  CREATININE 0.66  CALCIUM 9.4    Liver Function Tests: Recent Labs  Lab 06/15/18 1119  AST 26  ALT 23  ALKPHOS 97  BILITOT 0.6  PROT 8.1  ALBUMIN 4.9   No results for input(s): LIPASE, AMYLASE in the last 168 hours. No results for input(s): AMMONIA in the last 168 hours.  CBC: Recent Labs  Lab 06/15/18 1119  WBC 5.8  NEUTROABS 3.2  HGB 14.5  HCT 44.6  MCV 92.1  PLT 192    Cardiac Enzymes: Recent Labs  Lab 06/15/18 1119  TROPONINI <0.03    BNP: Invalid input(s): POCBNP  CBG: Recent Labs  Lab 06/15/18 1057  GLUCAP 78    Microbiology:   Coagulation Studies: Recent Labs    06/15/18 1119  LABPROT 12.3  INR 0.92    Urinalysis: No results for input(s): COLORURINE, LABSPEC, PHURINE, GLUCOSEU, HGBUR, BILIRUBINUR, KETONESUR, PROTEINUR, UROBILINOGEN, NITRITE, LEUKOCYTESUR in the last 168 hours.  Invalid input(s): APPERANCEUR  Lipid Panel:     Component Value Date/Time   CHOL 169 09/12/2011 0820   TRIG 77 09/12/2011 0820   HDL 60 09/12/2011 0820   VLDL 15 09/12/2011 0820   LDLCALC 94 09/12/2011 0820    HgbA1C:  No results found for: HGBA1C  Urine Drug Screen:  No results found for: LABOPIA, COCAINSCRNUR, LABBENZ, AMPHETMU, THCU,  LABBARB   Alcohol Level:  No results for input(s): ETH in the last 168 hours.  Miscellaneous labs:  EKG  EKG   IMAGING: Ct Angio Head W Or Wo Contrast  Result Date: 06/15/2018 CLINICAL DATA:  Abnormal feeling after waking today. Weakness. Heaviness. Decreased sensation in the right arm and leg. EXAM: CT ANGIOGRAPHY HEAD AND NECK TECHNIQUE: Multidetector CT imaging of the head and neck was performed using the standard protocol during bolus administration of intravenous contrast. Multiplanar CT image reconstructions and MIPs were obtained to evaluate the vascular anatomy. Carotid stenosis measurements (when applicable) are obtained utilizing NASCET criteria, using the distal internal carotid diameter as the  denominator. CONTRAST:  49mL OMNIPAQUE IOHEXOL 350 MG/ML SOLN COMPARISON:  Head CT same day FINDINGS: CTA NECK FINDINGS Aortic arch: Aortic atherosclerosis. 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 Right carotid system: Common carotid artery widely patent to the bifurcation region. Mild soft and calcified plaque at the ICA bulb. Minimal diameter is 4 mm, the same as the more distal cervical ICA. Therefore there is no stenosis. Upper cervical ICA is tortuous. Left carotid system: Common carotid artery widely patent to the bifurcation region. Primarily soft plaque at the carotid bifurcation and ICA bulb. Minimal diameter 3.7 mm. Compared to a more distal cervical ICA diameter of 4 mm, this indicates a 10% stenosis. Upper cervical ICA is tortuous but widely patent. Vertebral arteries: Left vertebral artery arises directly from the arch. No origin stenosis. This vessel is widely patent through the cervical region to the foramen magnum. Right vertebral artery shows calcified plaque at its origin with stenosis estimated at 30%. Beyond that, the vessel is widely patent through the cervical region.  Skeleton: Chronic spinal curvature. Chronic mid cervical degenerative changes. Other neck: No mass or lymphadenopathy. Upper chest: Normal Review of the MIP images confirms the above findings CTA HEAD FINDINGS Anterior circulation: Both internal carotid arteries are patent through the skull base and siphon regions. No siphon atherosclerotic calcification. The anterior and middle cerebral vessels are patent without proximal stenosis, aneurysm or vascular malformation. Posterior circulation: Both vertebral arteries are widely patent to the basilar. No basilar stenosis. Posterior circulation branch vessels patent and unremarkable. More distal PCA branch vessels do show some atherosclerotic irregularity. Venous sinuses: Patent and normal. Anatomic variants: None significant. Delayed phase: No abnormal enhancement. Review of the MIP images confirms the above findings IMPRESSION: 1. 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 2. Atherosclerotic plaque at both carotid bifurcations. No stenosis on the right. 10% stenosis on the left. 3. 30% stenosis at the right vertebral artery origin. 4. No intracranial large or medium vessel occlusion or correctable proximal stenosis. 5. The patient does have some smaller vessel atherosclerotic irregularity in the posterior cerebral artery branches. Aortic Atherosclerosis (ICD10-I70.0). Electronically Signed   By: Nelson Chimes M.D.   On: 06/15/2018 12:15   Ct Angio Neck W Or Wo Contrast  Result Date: 06/15/2018 CLINICAL DATA:  Abnormal feeling after waking today. Weakness. Heaviness. Decreased sensation in the right arm and leg. EXAM: CT ANGIOGRAPHY HEAD AND NECK TECHNIQUE: Multidetector CT imaging of the head and neck was performed using the standard protocol during bolus administration of intravenous contrast. Multiplanar CT image reconstructions and MIPs were  obtained to evaluate the vascular anatomy. Carotid stenosis measurements (when applicable) are obtained utilizing NASCET criteria, using the distal internal carotid diameter as the denominator. CONTRAST:  63mL OMNIPAQUE IOHEXOL 350 MG/ML SOLN COMPARISON:  Head CT same day FINDINGS: CTA NECK FINDINGS Aortic arch: Aortic atherosclerosis. 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 Right carotid system: Common carotid artery widely patent to the bifurcation region. Mild soft and calcified plaque at the ICA bulb. Minimal diameter is 4 mm, the same as the more distal cervical ICA. Therefore there is no stenosis. Upper cervical ICA is tortuous. Left carotid system: Common carotid artery widely patent to the bifurcation region. Primarily  soft plaque at the carotid bifurcation and ICA bulb. Minimal diameter 3.7 mm. Compared to a more distal cervical ICA diameter of 4 mm, this indicates a 10% stenosis. Upper cervical ICA is tortuous but widely patent. Vertebral arteries: Left vertebral artery arises directly from the arch. No origin stenosis. This vessel is widely patent through the cervical region to the foramen magnum. Right vertebral artery shows calcified plaque at its origin with stenosis estimated at 30%. Beyond that, the vessel is widely patent through the cervical region. Skeleton: Chronic spinal curvature. Chronic mid cervical degenerative changes. Other neck: No mass or lymphadenopathy. Upper chest: Normal Review of the MIP images confirms the above findings CTA HEAD FINDINGS Anterior circulation: Both internal carotid arteries are patent through the skull base and siphon regions. No siphon atherosclerotic calcification. The anterior and middle cerebral vessels are patent without proximal stenosis, aneurysm or vascular malformation. Posterior circulation: Both vertebral arteries are widely patent to the basilar. No  basilar stenosis. Posterior circulation branch vessels patent and unremarkable. More distal PCA branch vessels do show some atherosclerotic irregularity. Venous sinuses: Patent and normal. Anatomic variants: None significant. Delayed phase: No abnormal enhancement. Review of the MIP images confirms the above findings IMPRESSION: 1. 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 2. Atherosclerotic plaque at both carotid bifurcations. No stenosis on the right. 10% stenosis on the left. 3. 30% stenosis at the right vertebral artery origin. 4. No intracranial large or medium vessel occlusion or correctable proximal stenosis. 5. The patient does have some smaller vessel atherosclerotic irregularity in the posterior cerebral artery branches. Aortic Atherosclerosis (ICD10-I70.0). Electronically Signed   By: Nelson Chimes M.D.   On: 06/15/2018 12:15   Ct Head Code Stroke Wo Contrast  Result Date: 06/15/2018 CLINICAL DATA:  Code stroke. Right arm weakness starting this morning EXAM: CT HEAD WITHOUT CONTRAST TECHNIQUE: Contiguous axial images were obtained from the base of the skull through the vertex without intravenous contrast. COMPARISON:  Brain MRI 06/01/2016 FINDINGS: Brain: Low-density in the left thalamus, new and indistinct. No evidence of cortical infarct. No hemorrhage, hydrocephalus, or masslike finding Vascular: No hyperdense vessel or unexpected calcification. Skull: Negative Sinuses/Orbits: No acute finding Other: These results were called by telephone at the time of interpretation on 06/15/2018 at 11:10 am to present neurologist (Dr. Corky Downs was examining the patient) who verbally acknowledged these results. ASPECTS [facility] Stroke Program Early CT Score) Not scored given the above IMPRESSION: 1. 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 2. No intracranial hemorrhage. Electronically Signed    By: Monte Fantasia M.D.   On: 06/15/2018 11:13      Assessment: 58 y.o. female  with past medical history significant for migraines, hypertension presented to hospital complaining about left-sided numbness with CT showed possible left lacunar thalamic infarct, CT angiogram showed no large vessel occlusion.       Plan:  HgbA1c, fasting lipid panel  MRI brain   CT Angiogram Head and Neck reviewed   PT consult, OT consult, Speech consult  Echocardiogram  Prophylactic therapy - Antiplatelet medication start ASA 81 mg daily   Allow for permissive hypertension for the first 24-48h - only treat PRN if SBP >220 mmHg. Blood pressures can be gradually normalized to SBP<140 upon discharge  Statin Therapy - LDL goal < 70  Risk factor modification  Telemetry monitoring  Frequent neuro checks  Fall Precautions  DVT prophelaxis  NPO until swallowing evaluation has been passed (aspirin suppository if needed)      Arnaldo Natal, MD

## 2018-06-15 NOTE — Progress Notes (Signed)
   06/15/18 1100  Clinical Encounter Type  Visited With Patient;Other (Comment)  Visit Type Initial;Code  Referral From Nurse  Spiritual Encounters  Spiritual Needs Prayer;Emotional  Pt has code stroke. Ch offered pastoral presence and prayer.

## 2018-06-15 NOTE — Progress Notes (Signed)
*  PRELIMINARY RESULTS* Echocardiogram 2D Echocardiogram has been performed.  Lori Morales Lori Morales Lori Morales Lori Morales 06/15/2018, 4:18 PM

## 2018-06-15 NOTE — Progress Notes (Signed)
SLP Cancellation Note  Patient Details Name: Lori Morales MRN: [id number] DOB: March 04, 1961   Cancelled treatment:       Reason Eval/Treat Not Completed: SLP screened, no needs identified, will sign off((At This Time)). Chart reviewed; met w/ pt and Husband in room. Pt was sitting up in bed talking w/ other visitors (no speech/dysarthria noted overtly). Conversed w/ pt asking questions re: her language use, speech, and swallowing. Pt stated she has consumed thin liquids and eaten a meal w/ No difficulty swallowing; Husband agreed. Pt engaged in conversation describing her RUE heaviness and also described the incident this morning which brought her to the ED - no aphasia noted, no dysarthria. Pt did appear somewhat fatigued which can impact cognitive-linguistic engagement. Discussed the difference between difficulty using language and the motor weakness impacting writing language. Pt stated she felt it was just the heaviness of the R hand impacting her texting. Instructed pt to gage how she felt over the next 1-2 days w/ her language abilities/use and if she needed any further f/u assessment, she could be seen by Outpatient services w/ a referral from her PCP. Pt and Husband agreed w/ the informal assessment of no further services indicated at this time. Encouraged general aspiration precautions w/ any oral intake.    [name], Story, CCC-SLP Watson,Katherine 06/15/2018, 3:03 PM

## 2018-06-15 NOTE — ED Notes (Signed)
Lori Morales, [phone], contact information.

## 2018-06-15 NOTE — Plan of Care (Signed)
  Problem: Education: Goal: Knowledge of General Education information will improve Description Including pain rating scale, medication(s)/side effects and non-pharmacologic comfort measures Outcome: Progressing   Problem: Health Behavior/Discharge Planning: Goal: Ability to manage health-related needs will improve Outcome: Progressing   Problem: Clinical Measurements: Goal: Ability to maintain clinical measurements within normal limits will improve Outcome: Progressing   Problem: Safety: Goal: Ability to remain free from injury will improve Outcome: Progressing   Problem: Education: Goal: Knowledge of disease or condition will improve Outcome: Progressing Goal: Knowledge of secondary prevention will improve Outcome: Progressing   Problem: Ischemic Stroke/TIA Tissue Perfusion: Goal: Complications of ischemic stroke/TIA will be minimized Outcome: Progressing

## 2018-06-15 NOTE — ED Notes (Signed)
Patient transported to CT 

## 2018-06-16 LAB — BASIC METABOLIC PANEL
Anion gap: 5 (ref 5–15)
BUN: 17 mg/dL (ref 6–20)
CO2: 26 mmol/L (ref 22–32)
Calcium: 9 mg/dL (ref 8.9–10.3)
Chloride: 107 mmol/L (ref 98–111)
Creatinine, Ser: 0.49 mg/dL (ref 0.44–1.00)
GFR calc Af Amer: >60 mL/min (ref >60)
GFR calc non Af Amer: >60 mL/min (ref >60)
Glucose, Bld: 106 mg/dL — ABNORMAL HIGH (ref 70–99)
Potassium: 3.8 mmol/L (ref 3.5–5.1)
Sodium: 138 mmol/L (ref 135–145)

## 2018-06-16 LAB — TROPONIN I: Troponin I: <0.03 ng/mL (ref <0.03)

## 2018-06-16 LAB — HEMOGLOBIN A1C
Hgb A1c MFr Bld: 5.9 % — ABNORMAL HIGH (ref 4.8–5.6)
Mean Plasma Glucose: 122.63 mg/dL

## 2018-06-16 LAB — TSH: TSH: 5.931 u[IU]/mL — ABNORMAL HIGH (ref 0.350–4.500)

## 2018-06-16 LAB — HIV ANTIBODY (ROUTINE TESTING W REFLEX): HIV Screen 4th Generation wRfx: NONREACTIVE

## 2018-06-16 LAB — GLUCOSE, CAPILLARY: Glucose-Capillary: 99 mg/dL (ref 70–99)

## 2018-06-16 MED ORDER — ASPIRIN 81 MG PO TBEC: 81.0000 mg | DELAYED_RELEASE_TABLET | Freq: Every day | ORAL | 1 refills | Status: AC

## 2018-06-16 MED ORDER — ROSUVASTATIN CALCIUM 10 MG PO TABS: 10.0000 mg | ORAL_TABLET | Freq: Every day | ORAL | 1 refills | Status: DC

## 2018-06-16 MED ORDER — LOSARTAN POTASSIUM 25 MG PO TABS: 25.0000 mg | ORAL_TABLET | Freq: Every day | ORAL | 1 refills | Status: DC

## 2018-06-16 NOTE — Discharge Summary (Signed)
[facility] [place] NAME: Lori Morales    MR#:  [id number]  DATE OF BIRTH:  1961-01-11  DATE OF ADMISSION:  06/15/2018 ADMITTING PHYSICIAN: Nicholes Mango, MD  DATE OF DISCHARGE: 06/16/2018 11:18 AM  PRIMARY CARE PHYSICIAN: Tama High III, MD    ADMISSION DIAGNOSIS:  Acute ischemic stroke ([place]) [I63.9]  DISCHARGE DIAGNOSIS:  Active Problems:   Acute CVA (cerebrovascular accident) ([place])   SECONDARY DIAGNOSIS:   Past Medical History:  Diagnosis Date  . Arthritis   . Depression   . GERD (gastroesophageal reflux disease)   . Headache    migraines  . Hypertension   . IBS (irritable bowel syndrome)   . Palpitations   . PONV (postoperative nausea and vomiting)   . Restless legs syndrome     HOSPITAL COURSE:   58 year old female with past medical history of hypertension, IBS, restless leg syndrome, arthritis, GERD who presented to the hospital due to right arm weakness/numbness and also gait instability.  1.  Acute CVA-this was the cause of patient's right arm weakness and numbness and gait instability.  Patient had an MRI on admission which showed a acute left thalamic stroke.   -Patient had ongoing symptoms for a while which she attributed to possibility her sleeping aid but continued to have some difficulty using her right arm when texting and also writing and therefore a stroke code was initiated.  Patient underwent an MRI which was positive as mentioned above. - Seen by neurology and this is likely small vessel CVA.  Patient was started on aspirin, low-dose statin. - Patient has also been seen by physical therapy, Occupational Therapy.  Patient's clinical symptoms have improved and she is now being discharged home on the aspirin and statin and did not require any therapy services.  2.  Essential hypertension- patient had some relative bradycardia and therefore was taken off her Toprol and switched over to oral losartan as per  cardiology.  She is presently being discharged on losartan and further changes of her meds can be done as an outpatient.  3.  Chest pain/pressure-patient was observed on telemetry, she had 3 sets of cardiac markers checked which were negative. - Echocardiogram showed normal ejection fraction with no wall motion abnormalities.  She is stable to be discharged from a cardiac perspective.    4.  GERD-she will continue her Protonix.  5.  Restless leg syndrome-she will continue her Mirapex  6.  Osteoarthritis-she will continue her Celebrex.  DISCHARGE CONDITIONS:   Stable  CONSULTS OBTAINED:  Treatment Team:  Catarina Hartshorn, MD Minna Merritts, MD  DRUG ALLERGIES:   Allergies  Allergen Reactions  . Penicillins Hives and Rash  . Pristiq [Desvenlafaxine] Swelling and Other (See Comments)    Headaches (reaction to Pristiq)    DISCHARGE MEDICATIONS:   Allergies as of 06/16/2018      Reactions   Penicillins Hives, Rash   Pristiq [desvenlafaxine] Swelling, Other (See Comments)   Headaches (reaction to Pristiq)      Medication List    STOP taking these medications   benzonatate 200 MG capsule Commonly known as:  TESSALON   cephALEXin 500 MG capsule Commonly known as:  KEFLEX   chlorpheniramine-HYDROcodone 10-8 MG/5ML Suer Commonly known as:  TUSSIONEX PENNKINETIC ER   HYDROcodone-homatropine 5-1.5 MG/5ML syrup Commonly known as:  HYCODAN   metoprolol succinate 25 MG 24 hr tablet Commonly known as:  TOPROL-XL   predniSONE 10 MG (21) Tbpk tablet Commonly known  asArnetha Gula 21 TAB     TAKE these medications   amitriptyline 10 MG tablet Commonly known as:  ELAVIL Take 10 mg by mouth at bedtime.   aspirin 81 MG EC tablet Take 1 tablet (81 mg total) by mouth daily.   celecoxib 200 MG capsule Commonly known as:  CELEBREX Take 200 mg by mouth every evening.   losartan 25 MG tablet Commonly known as:  COZAAR Take 1 tablet (25 mg total) by mouth  daily.   MIMVEY LO 0.5-0.1 MG tablet Generic drug:  Estradiol-Norethindrone Acet Take 1 tablet by mouth at bedtime.   pantoprazole 40 MG tablet Commonly known as:  PROTONIX Take 40 mg by mouth at bedtime.   pramipexole 0.25 MG tablet Commonly known as:  MIRAPEX Take 0.25 mg by mouth at bedtime.   rosuvastatin 10 MG tablet Commonly known as:  CRESTOR Take 1 tablet (10 mg total) by mouth daily at 6 PM.   zolpidem 6.25 MG CR tablet Commonly known as:  AMBIEN CR Take 6.25 mg by mouth at bedtime as needed for sleep.         DISCHARGE INSTRUCTIONS:   DIET:  Cardiac diet  DISCHARGE CONDITION:  Stable  ACTIVITY:  Activity as tolerated  OXYGEN:  Home Oxygen: No.   Oxygen Delivery: room air  DISCHARGE LOCATION:  home   If you experience worsening of your admission symptoms, develop shortness of breath, life threatening emergency, suicidal or homicidal thoughts you must seek medical attention immediately by calling 911 or calling your MD immediately  if symptoms less severe.  You Must read complete instructions/literature along with all the possible adverse reactions/side effects for all the Medicines you take and that have been prescribed to you. Take any new Medicines after you have completely understood and accpet all the possible adverse reactions/side effects.   Please note  You were cared for by a hospitalist during your hospital stay. If you have any questions about your discharge medications or the care you received while you were in the hospital after you are discharged, you can call the unit and asked to speak with the hospitalist on call if the hospitalist that took care of you is not available. Once you are discharged, your primary care physician will handle any further medical issues. Please note that NO REFILLS for any discharge medications will be authorized once you are discharged, as it is imperative that you return to your primary care physician (or  establish a relationship with a primary care physician if you do not have one) for your aftercare needs so that they can reassess your need for medications and monitor your lab values.     Today   No acute events overnight, patient's right hand/arm weakness has improved.  No slurred speech no headache and any other neurologic symptoms.  Ambulated well with the help of physical therapy.  Will discharge home today.  VITAL SIGNS:  Blood pressure 127/80, pulse (!) 54, temperature 97.6 F (36.4 C), resp. rate 17, height 5' 4.5" (1.638 m), weight 82.1 kg, last menstrual period 02/13/2012, SpO2 96 %.  I/O:  No intake or output data in the 24 hours ending 06/16/18 1415  PHYSICAL EXAMINATION:  GENERAL:  58 y.o.-year-old patient lying in the bed with no acute distress.  EYES: Pupils equal, round, reactive to light and accommodation. No scleral icterus. Extraocular muscles intact.  HEENT: Head atraumatic, normocephalic. Oropharynx and nasopharynx clear.  NECK:  Supple, no jugular venous distention. No thyroid enlargement,  no tenderness.  LUNGS: Normal breath sounds bilaterally, no wheezing, rales,rhonchi. No use of accessory muscles of respiration.  CARDIOVASCULAR: S1, S2 normal. No murmurs, rubs, or gallops.  ABDOMEN: Soft, non-tender, non-distended. Bowel sounds present. No organomegaly or mass.  EXTREMITIES: No pedal edema, cyanosis, or clubbing.  NEUROLOGIC: Cranial nerves II through XII are intact. No focal motor or sensory defecits b/l.  PSYCHIATRIC: The patient is alert and oriented x 3.  SKIN: No obvious rash, lesion, or ulcer.   DATA REVIEW:   CBC Recent Labs  Lab 06/15/18 1519  WBC 5.7  HGB 14.2  HCT 42.7  PLT 172    Chemistries  Recent Labs  Lab 06/15/18 1119 06/15/18 1953 06/16/18 0134  NA 139  --  138  K 3.6  --  3.8  CL 105  --  107  CO2 26  --  26  GLUCOSE 111*  --  106*  BUN 14  --  17  CREATININE 0.66  --  0.49  CALCIUM 9.4  --  9.0  MG  --  2.1  --   AST  26  --   --   ALT 23  --   --   ALKPHOS 97  --   --   BILITOT 0.6  --   --     Cardiac Enzymes Recent Labs  Lab 06/16/18 0134  TROPONINI <0.03     RADIOLOGY:  Ct Angio Head W Or Wo Contrast  Result Date: 06/15/2018 CLINICAL DATA:  Abnormal feeling after waking today. Weakness. Heaviness. Decreased sensation in the right arm and leg. EXAM: CT ANGIOGRAPHY HEAD AND NECK TECHNIQUE: Multidetector CT imaging of the head and neck was performed using the standard protocol during bolus administration of intravenous contrast. Multiplanar CT image reconstructions and MIPs were obtained to evaluate the vascular anatomy. Carotid stenosis measurements (when applicable) are obtained utilizing NASCET criteria, using the distal internal carotid diameter as the denominator. CONTRAST:  35mL OMNIPAQUE IOHEXOL 350 MG/ML SOLN COMPARISON:  Head CT same day FINDINGS: CTA NECK FINDINGS Aortic arch: Aortic atherosclerosis. 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 Right carotid system: Common carotid artery widely patent to the bifurcation region. Mild soft and calcified plaque at the ICA bulb. Minimal diameter is 4 mm, the same as the more distal cervical ICA. Therefore there is no stenosis. Upper cervical ICA is tortuous. Left carotid system: Common carotid artery widely patent to the bifurcation region. Primarily soft plaque at the carotid bifurcation and ICA bulb. Minimal diameter 3.7 mm. Compared to a more distal cervical ICA diameter of 4 mm, this indicates a 10% stenosis. Upper cervical ICA is tortuous but widely patent. Vertebral arteries: Left vertebral artery arises directly from the arch. No origin stenosis. This vessel is widely patent through the cervical region to the foramen magnum. Right vertebral artery shows calcified plaque at its origin with stenosis estimated at 30%. Beyond that, the vessel is widely  patent through the cervical region. Skeleton: Chronic spinal curvature. Chronic mid cervical degenerative changes. Other neck: No mass or lymphadenopathy. Upper chest: Normal Review of the MIP images confirms the above findings CTA HEAD FINDINGS Anterior circulation: Both internal carotid arteries are patent through the skull base and siphon regions. No siphon atherosclerotic calcification. The anterior and middle cerebral vessels are patent without proximal stenosis, aneurysm or vascular malformation. Posterior circulation: Both vertebral arteries are widely patent to the basilar. No basilar stenosis.  Posterior circulation branch vessels patent and unremarkable. More distal PCA branch vessels do show some atherosclerotic irregularity. Venous sinuses: Patent and normal. Anatomic variants: None significant. Delayed phase: No abnormal enhancement. Review of the MIP images confirms the above findings IMPRESSION: 1. 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 2. Atherosclerotic plaque at both carotid bifurcations. No stenosis on the right. 10% stenosis on the left. 3. 30% stenosis at the right vertebral artery origin. 4. No intracranial large or medium vessel occlusion or correctable proximal stenosis. 5. The patient does have some smaller vessel atherosclerotic irregularity in the posterior cerebral artery branches. Aortic Atherosclerosis (ICD10-I70.0). Electronically Signed   By: Nelson Chimes M.D.   On: 06/15/2018 12:15   Ct Angio Neck W Or Wo Contrast  Result Date: 06/15/2018 CLINICAL DATA:  Abnormal feeling after waking today. Weakness. Heaviness. Decreased sensation in the right arm and leg. EXAM: CT ANGIOGRAPHY HEAD AND NECK TECHNIQUE: Multidetector CT imaging of the head and neck was performed using the standard protocol during bolus administration of intravenous contrast. Multiplanar CT  image reconstructions and MIPs were obtained to evaluate the vascular anatomy. Carotid stenosis measurements (when applicable) are obtained utilizing NASCET criteria, using the distal internal carotid diameter as the denominator. CONTRAST:  70mL OMNIPAQUE IOHEXOL 350 MG/ML SOLN COMPARISON:  Head CT same day FINDINGS: CTA NECK FINDINGS Aortic arch: Aortic atherosclerosis. 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 Right carotid system: Common carotid artery widely patent to the bifurcation region. Mild soft and calcified plaque at the ICA bulb. Minimal diameter is 4 mm, the same as the more distal cervical ICA. Therefore there is no stenosis. Upper cervical ICA is tortuous. Left carotid system: Common carotid artery widely patent to the bifurcation region. Primarily soft plaque at the carotid bifurcation and ICA bulb. Minimal diameter 3.7 mm. Compared to a more distal cervical ICA diameter of 4 mm, this indicates a 10% stenosis. Upper cervical ICA is tortuous but widely patent. Vertebral arteries: Left vertebral artery arises directly from the arch. No origin stenosis. This vessel is widely patent through the cervical region to the foramen magnum. Right vertebral artery shows calcified plaque at its origin with stenosis estimated at 30%. Beyond that, the vessel is widely patent through the cervical region. Skeleton: Chronic spinal curvature. Chronic mid cervical degenerative changes. Other neck: No mass or lymphadenopathy. Upper chest: Normal Review of the MIP images confirms the above findings CTA HEAD FINDINGS Anterior circulation: Both internal carotid arteries are patent through the skull base and siphon regions. No siphon atherosclerotic calcification. The anterior and middle cerebral vessels are patent without proximal stenosis, aneurysm or vascular malformation. Posterior circulation: Both vertebral arteries  are widely patent to the basilar. No basilar stenosis. Posterior circulation branch vessels patent and unremarkable. More distal PCA branch vessels do show some atherosclerotic irregularity. Venous sinuses: Patent and normal. Anatomic variants: None significant. Delayed phase: No abnormal enhancement. Review of the MIP images confirms the above findings IMPRESSION: 1. 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 2. Atherosclerotic plaque at both carotid bifurcations. No stenosis on the right. 10% stenosis on the left. 3. 30% stenosis at the right vertebral artery origin. 4. No intracranial large or medium vessel occlusion  or correctable proximal stenosis. 5. The patient does have some smaller vessel atherosclerotic irregularity in the posterior cerebral artery branches. Aortic Atherosclerosis (ICD10-I70.0). Electronically Signed   By: Nelson Chimes M.D.   On: 06/15/2018 12:15   Mr Brain Wo Contrast  Result Date: 06/15/2018 CLINICAL DATA:  Acute presentation with decreased sensation of the right arm and leg and weakness. EXAM: MRI HEAD WITHOUT CONTRAST TECHNIQUE: Multiplanar, multiecho pulse sequences of the brain and surrounding structures were obtained without intravenous contrast. COMPARISON:  CT studies same day. FINDINGS: Brain: 9 mm acute infarction of the left thalamus. No other acute infarction. No swelling or hemorrhage. Elsewhere, brainstem and cerebellum are normal. There are mild chronic small-vessel ischemic changes of the hemispheric deep white matter. No cortical or large vessel territory infarction. No mass lesion, hydrocephalus or extra-axial collection. Vascular: Major vessels at the base of the brain show flow. 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 Electronically Signed   By: Nelson Chimes M.D.   On: 06/15/2018 12:58   Ct Head Code Stroke Wo Contrast  Result Date: 06/15/2018 CLINICAL DATA:  Code stroke. Right arm weakness starting this morning EXAM: CT HEAD WITHOUT CONTRAST TECHNIQUE: Contiguous axial images were obtained from the base of the skull through the vertex without intravenous contrast. COMPARISON:  Brain MRI 06/01/2016 FINDINGS: Brain: Low-density in the left thalamus, new and indistinct. No evidence of cortical infarct. No hemorrhage, hydrocephalus, or masslike finding Vascular: No hyperdense vessel or unexpected calcification. Skull: Negative Sinuses/Orbits: No acute finding Other: These results were called by telephone at the time of interpretation on 06/15/2018 at 11:10 am to present neurologist (Dr. Corky Downs was examining the patient) who verbally acknowledged these results. ASPECTS [facility] Stroke Program Early CT Score) Not scored given the above IMPRESSION: 1. 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 2. No intracranial hemorrhage. Electronically Signed   By: Monte Fantasia M.D.   On: 06/15/2018 11:13      Management plans discussed with the patient, family and they are in agreement.  CODE STATUS:     Code Status Orders  (From admission, onward)         Start     Ordered   06/15/18 1351  Full code  Continuous     06/15/18 1352          TOTAL TIME TAKING CARE OF THIS PATIENT: 40 minutes.    Henreitta Leber M.D on 06/16/2018 at 2:15 PM  Between 7am to 6pm - Pager - [phone]  After 6pm go to [email or web address] - Proofreader  [facility]  Office  [phone]  CC: Primary care physician; Adin Hector, MD

## 2018-06-16 NOTE — Progress Notes (Signed)
Brief History  Lori Morales is an 58 y.o. female with past medical history significant for hypertension, migraines who woke up this morning feeling off.  Patient stated that she woke up this morning feeling little bit unusual, she drove to work, but around 8:30 AM she was feeling little bit numb on the right side that include right upper extremity right lower extremity but not the face, patient reported that she while she was driving she was drifting to the right, when she arrived at work she decided to come to the emergency room where stroke code was called.  Patient denied any slurred speech, no visual changes, no focal motor weakness however she felt that her right-sided was heavier, patient denies any shortness of breath, she denied any chest pain, no fevers. Patient reported that she does not take aspirin, she is not on any anticoagulation.  In the emergency room her systolic blood pressure was in the 170, her CT scan of the brain shows possible left lacunar thalamic stroke, CT angiogram showed no large vessel occlusion but multiple intracranial atherosclerosis. IV tPA was not given due to the fact that NIH stroke scale was low with left upper and lower extremity numbness and CT scan of the brain already showed a possible left thalamic lacunar stroke patient was admitted to the hospital for stroke work-up.   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 Arthritis   . Depression   . GERD (gastroesophageal reflux disease)   . Headache    migraines  . Hypertension   . IBS (irritable bowel syndrome)   . Palpitations   . PONV (postoperative nausea and vomiting)   . Restless legs syndrome     Past Surgical History Past Surgical History:  Procedure Laterality Date  . ABDOMINOPLASTY    . BACK SURGERY     1 laminectomy, 1 fusion  . COLONOSCOPY    . COLONOSCOPY WITH PROPOFOL N/A 01/17/2017   Procedure:  COLONOSCOPY WITH PROPOFOL;  Surgeon: Lollie Sails, MD;  Location: [facility];  Service: Endoscopy;  Laterality: N/A;  . WISDOM TOOTH EXTRACTION      Allergies Allergies  Allergen Reactions  . Penicillins Hives and Rash  . Pristiq [Desvenlafaxine] Swelling and Other (See Comments)    Headaches (reaction to Pristiq)    Home Medications Medications Prior to Admission  Medication Sig Dispense Refill  . amitriptyline (ELAVIL) 10 MG tablet Take 10 mg by mouth at bedtime.     . celecoxib (CELEBREX) 200 MG capsule Take 200 mg by mouth every evening.     . metoprolol succinate (TOPROL-XL) 25 MG 24 hr tablet Take 25 mg by mouth at bedtime.     Marland Kitchen MIMVEY LO 0.5-0.1 MG per tablet Take 1 tablet by mouth at bedtime.   4  . pantoprazole (PROTONIX) 40 MG tablet Take 40 mg by mouth at bedtime.     . pramipexole (MIRAPEX) 0.25 MG tablet Take 0.25 mg by mouth at bedtime.    Marland Kitchen zolpidem (AMBIEN CR) 6.25 MG CR tablet Take 6.25 mg by mouth at bedtime as needed for sleep.     . benzonatate (TESSALON) 200 MG capsule Take 1 capsule (200 mg total) by mouth 3 (three) times daily as needed for cough (during the day while at work). (Patient not taking: Reported on 08/25/2017) 30 capsule 0  . cephALEXin (KEFLEX) 500 MG capsule Take 1 capsule (500 mg total) by mouth  2 (two) times daily. 20 capsule 0  . chlorpheniramine-HYDROcodone (TUSSIONEX PENNKINETIC ER) 10-8 MG/5ML SUER Take 5 mLs by mouth every 12 (twelve) hours as needed for cough. (Patient not taking: Reported on 08/25/2017) 150 mL 0  . HYDROcodone-homatropine (HYCODAN) 5-1.5 MG/5ML syrup Take 5 mLs by mouth every 6 (six) hours as needed for cough. (Patient not taking: Reported on 08/25/2017) 120 mL 0  . predniSONE (STERAPRED UNI-PAK 21 TAB) 10 MG (21) TBPK tablet By mouth Take 6 tablets on day 1, Take 5 tablets day 2 Take 4 tablets day 3 Take 3 tablets day 4 Take 2 tablets day five 5 Take 1 tablet day 21 tablet 0    Hospital Medications . amitriptyline   10 mg Oral QHS  . aspirin EC  81 mg Oral Daily  . celecoxib  200 mg Oral QPM  . enoxaparin (LOVENOX) injection  40 mg Subcutaneous Q24H  . Estradiol-Norethindrone Acet  1 tablet Oral QHS  . insulin aspart  0-5 Units Subcutaneous QHS  . insulin aspart  0-9 Units Subcutaneous TID WC  . losartan  25 mg Oral Daily  . pantoprazole  40 mg Oral QHS  . potassium chloride  20 mEq Oral BID  . pramipexole  0.25 mg Oral QHS  . rosuvastatin  5 mg Oral q1800  . sodium chloride flush  3 mL Intravenous Once     Objective  Feels better, still having difficulties with R hand  fine motor tasks  Intake/Output from previous day: No intake/output data recorded. Intake/Output this shift: No intake/output data recorded. Nutritional status:  Diet Order            Diet Heart Room service appropriate? Yes; Fluid consistency: Thin  Diet effective now               Physical Exam -  Vitals:   06/15/18 2223 06/16/18 0009 06/16/18 0200 06/16/18 0418  BP: (!) 144/89 125/86 120/80 127/80  Pulse: (!) 57 (!) 51 (!) 55 (!) 54  Resp: 18 17 17 17   Temp: 97.7 F (36.5 C) (!) 97.5 F (36.4 C) 97.7 F (36.5 C) 97.6 F (36.4 C)  TempSrc: Oral Oral Oral   SpO2: 97% 99% 99% 96%  Weight:      Height:       General - lying in bed NAD  Heart - Regular rate and rhythm - no murmer Lungs - Clear to auscultation Abdomen - Soft - non tender Extremities - Distal pulses intact - no edema Skin - Warm and dry  Neurologic Exam:   Neurologic Examination: NIHSS 1 Mental Status: Alert, oriented, thought content appropriate.  Speech fluent without evidence of aphasia. Able to follow 3 step commands without difficulty. Cranial Nerves: II: Discs not visualized; Visual fields grossly normal, pupils equal, round, reactive to light III,IV, VI: ptosis not present, extra-ocular motions intact bilaterally V,VII: smile symmetric, facial light touch sensation normal bilaterally VIII: hearing normal bilaterally XI:  bilateral shoulder shrug XII: midline tongue extension Motor: RUE - 5/5                                            LUE - 5/5   RLE - 5/5  LLE - 5/5 Tone and bulk:normal tone throughout; no atrophy noted Sensory: Decreased on RUE/RLE Deep Tendon Reflexes: 2+ and symmetric throughout Cerebellar: normal finger-to-nose and normal heel-to-shin test Gait: deferred at this time.    LABORATORY RESULTS:  Basic Metabolic Panel: Recent Labs  Lab 06/15/18 1119 06/15/18 1953 06/16/18 0134  NA 139  --  138  K 3.6  --  3.8  CL 105  --  107  CO2 26  --  26  GLUCOSE 111*  --  106*  BUN 14  --  17  CREATININE 0.66  --  0.49  CALCIUM 9.4  --  9.0  MG  --  2.1  --     Liver Function Tests: Recent Labs  Lab 06/15/18 1119  AST 26  ALT 23  ALKPHOS 97  BILITOT 0.6  PROT 8.1  ALBUMIN 4.9   No results for input(s): LIPASE, AMYLASE in the last 168 hours. No results for input(s): AMMONIA in the last 168 hours.  CBC: Recent Labs  Lab 06/15/18 1119 06/15/18 1519  WBC 5.8 5.7  NEUTROABS 3.2  --   HGB 14.5 14.2  HCT 44.6 42.7  MCV 92.1 92.4  PLT 192 172    Cardiac Enzymes: Recent Labs  Lab 06/15/18 1119 06/15/18 1519 06/15/18 1953 06/16/18 0134  TROPONINI <0.03 <0.03 <0.03 <0.03    Lipid Panel: Recent Labs  Lab 06/15/18 1953  CHOL 211*  TRIG 58  HDL 65  CHOLHDL 3.2  VLDL 12  LDLCALC 134*    CBG: Recent Labs  Lab 06/15/18 1057 06/15/18 1713 06/15/18 2051 06/16/18 0747  GLUCAP 78 74 99 99    Microbiology:   Coagulation Studies: Recent Labs    06/15/18 1119  LABPROT 12.3  INR 0.92    Miscellaneous Labs:   IMAGING RESULTS Ct Angio Head W Or Wo Contrast  Result Date: 06/15/2018 CLINICAL DATA:  Abnormal feeling after waking today. Weakness. Heaviness. Decreased sensation in the right arm and leg. EXAM: CT ANGIOGRAPHY HEAD AND NECK TECHNIQUE: Multidetector CT imaging of the head and neck was performed  using the standard protocol during bolus administration of intravenous contrast. Multiplanar CT image reconstructions and MIPs were obtained to evaluate the vascular anatomy. Carotid stenosis measurements (when applicable) are obtained utilizing NASCET criteria, using the distal internal carotid diameter as the denominator. CONTRAST:  59mL OMNIPAQUE IOHEXOL 350 MG/ML SOLN COMPARISON:  Head CT same day FINDINGS: CTA NECK FINDINGS Aortic arch: Aortic atherosclerosis. 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 Right carotid system: Common carotid artery widely patent to the bifurcation region. Mild soft and calcified plaque at the ICA bulb. Minimal diameter is 4 mm, the same as the more distal cervical ICA. Therefore there is no stenosis. Upper cervical ICA is tortuous. Left carotid system: Common carotid artery widely patent to the bifurcation region. Primarily soft plaque at the carotid bifurcation and ICA bulb. Minimal diameter 3.7 mm. Compared to a more distal cervical ICA diameter of 4 mm, this indicates a 10% stenosis. Upper cervical ICA is tortuous but widely patent. Vertebral arteries: Left vertebral artery arises directly from the arch. No origin stenosis. This vessel is widely patent through the cervical region to the foramen magnum. Right vertebral artery shows calcified plaque at its origin with stenosis estimated at 30%. Beyond that, the vessel is widely patent through the cervical region. Skeleton: Chronic spinal curvature. Chronic mid cervical degenerative changes. Other neck:  No mass or lymphadenopathy. Upper chest: Normal Review of the MIP images confirms the above findings CTA HEAD FINDINGS Anterior circulation: Both internal carotid arteries are patent through the skull base and siphon regions. No siphon atherosclerotic calcification. The anterior and middle cerebral vessels are patent without  proximal stenosis, aneurysm or vascular malformation. Posterior circulation: Both vertebral arteries are widely patent to the basilar. No basilar stenosis. Posterior circulation branch vessels patent and unremarkable. More distal PCA branch vessels do show some atherosclerotic irregularity. Venous sinuses: Patent and normal. Anatomic variants: None significant. Delayed phase: No abnormal enhancement. Review of the MIP images confirms the above findings IMPRESSION: 1. 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 2. Atherosclerotic plaque at both carotid bifurcations. No stenosis on the right. 10% stenosis on the left. 3. 30% stenosis at the right vertebral artery origin. 4. No intracranial large or medium vessel occlusion or correctable proximal stenosis. 5. The patient does have some smaller vessel atherosclerotic irregularity in the posterior cerebral artery branches. Aortic Atherosclerosis (ICD10-I70.0). Electronically Signed   By: Nelson Chimes M.D.   On: 06/15/2018 12:15   Ct Angio Neck W Or Wo Contrast  Result Date: 06/15/2018 CLINICAL DATA:  Abnormal feeling after waking today. Weakness. Heaviness. Decreased sensation in the right arm and leg. EXAM: CT ANGIOGRAPHY HEAD AND NECK TECHNIQUE: Multidetector CT imaging of the head and neck was performed using the standard protocol during bolus administration of intravenous contrast. Multiplanar CT image reconstructions and MIPs were obtained to evaluate the vascular anatomy. Carotid stenosis measurements (when applicable) are obtained utilizing NASCET criteria, using the distal internal carotid diameter as the denominator. CONTRAST:  61mL OMNIPAQUE IOHEXOL 350 MG/ML SOLN COMPARISON:  Head CT same day FINDINGS: CTA NECK FINDINGS Aortic arch: Aortic atherosclerosis. 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 Left vertebral artery arises directly from the arch. Right carotid system: Common carotid artery widely patent to the bifurcation region. Mild soft and calcified plaque at the ICA bulb. Minimal diameter is 4 mm, the same as the more distal cervical ICA. Therefore there is no stenosis. Upper cervical ICA is tortuous. Left carotid system: Common carotid artery widely patent to the bifurcation region. Primarily soft plaque at the carotid bifurcation and ICA bulb. Minimal diameter 3.7 mm. Compared to a more distal cervical ICA diameter of 4 mm, this indicates a 10% stenosis. Upper cervical ICA is tortuous but widely patent. Vertebral arteries: Left vertebral artery arises directly from the arch. No origin stenosis. This vessel is widely patent through the cervical region to the foramen magnum. Right vertebral artery shows calcified plaque at its origin with stenosis estimated at 30%. Beyond that, the vessel is widely patent through the cervical region. Skeleton: Chronic spinal curvature. Chronic mid cervical degenerative changes. Other neck: No mass or lymphadenopathy. Upper chest: Normal Review of the MIP images confirms the above findings CTA HEAD FINDINGS Anterior circulation: Both internal carotid arteries are patent through the skull base and siphon regions. No siphon atherosclerotic calcification. The anterior and middle cerebral vessels are patent without proximal stenosis, aneurysm or vascular malformation. Posterior circulation: Both vertebral arteries are widely patent to the basilar. No basilar stenosis. Posterior circulation branch vessels patent and unremarkable. More distal PCA branch vessels do show some atherosclerotic irregularity. Venous sinuses: Patent and normal. Anatomic variants: None significant. Delayed phase: No abnormal enhancement. Review of the MIP  images confirms the above findings IMPRESSION: 1. 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 Left vertebral artery arises directly from the arch. 2. Atherosclerotic plaque at both carotid bifurcations. No stenosis on the right. 10% stenosis on the left. 3. 30% stenosis at the right vertebral artery origin. 4. No intracranial large or medium vessel occlusion or correctable proximal stenosis. 5. The patient does have some smaller vessel atherosclerotic irregularity in the posterior cerebral artery branches. Aortic Atherosclerosis (ICD10-I70.0). Electronically Signed   By: Nelson Chimes M.D.   On: 06/15/2018 12:15   Mr Brain Wo Contrast  Result Date: 06/15/2018 CLINICAL DATA:  Acute presentation with decreased sensation of the right arm and leg and weakness. EXAM: MRI HEAD WITHOUT CONTRAST TECHNIQUE: Multiplanar, multiecho pulse sequences of the brain and surrounding structures were obtained without intravenous contrast. COMPARISON:  CT studies same day. FINDINGS: Brain: 9 mm acute infarction of the left thalamus. No other acute infarction. No swelling or hemorrhage. Elsewhere, brainstem and cerebellum are normal. There are mild chronic small-vessel ischemic changes of the hemispheric deep white matter. No cortical or large vessel territory infarction. No mass lesion, hydrocephalus or extra-axial collection. Vascular: Major vessels at the base of the brain show flow. 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 Electronically Signed   By: Nelson Chimes M.D.   On: 06/15/2018 12:58   Ct Head Code Stroke Wo Contrast  Result Date: 06/15/2018 CLINICAL DATA:  Code stroke. Right arm weakness starting this morning EXAM: CT HEAD WITHOUT CONTRAST TECHNIQUE: Contiguous axial images were obtained from the base of the skull through the vertex without intravenous contrast. COMPARISON:  Brain MRI 06/01/2016 FINDINGS: Brain: Low-density in the left thalamus, new and indistinct. No evidence of cortical  infarct. No hemorrhage, hydrocephalus, or masslike finding Vascular: No hyperdense vessel or unexpected calcification. Skull: Negative Sinuses/Orbits: No acute finding Other: These results were called by telephone at the time of interpretation on 06/15/2018 at 11:10 am to present neurologist (Dr. Corky Downs was examining the patient) who verbally acknowledged these results. ASPECTS [facility] Stroke Program Early CT Score) Not scored given the above IMPRESSION: 1. 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 2. No intracranial hemorrhage. Electronically Signed   By: Monte Fantasia M.D.   On: 06/15/2018 11:13        Assessment/Plan:  57 y.o. 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 PT/OT  -ECHO reviewed, normal EF no thrombus -Hyperlipidemia on statin -Follow up with out patient neurology -Plan discussed in details with the patient.    Arnaldo Natal, MD

## 2018-06-16 NOTE — Progress Notes (Signed)
PT Screening Note  Patient Details Name: Lori Morales MRN: [id number] DOB: [birth year]   Cancelled Treatment:    Reason Eval/Treat Not Completed: PT screened, no needs identified, will sign off. Pt AMB 400+ft at baseline gait speed, no gross gait impairment or asymmetry seen. Pt performs 6 stairs up/down, denies safety concerns with independent performance at home. Pt educated on importance of moderate intensity exercise in early acute phase s/p CVA to optimize return to baseline subjective function. Patient is at baseline for basic functional mobility, all education completed, and time is given to address all questions/concerns. No additional skilled PT services needed at this time, PT signing off. PT recommends daily ambulation ad lib or with nursing staff as needed to prevent deconditioning.    10:38 AM, 06/16/18 Etta Grandchild, PT, DPT Physical Therapist - [facility]  [phone] ([place])     Buccola,Allan C 06/16/2018, 10:36 AM

## 2018-06-16 NOTE — Evaluation (Signed)
Occupational Therapy Evaluation Patient Details Name: Lori Morales MRN: [id number] DOB: 19-Aug-1960 Today's Date: 06/16/2018    History of Present Illness Lori Morales  is a 58 y.o. female with a known history of essential hypertension, migraine headache. She presented to the ED with right-sided heaviness and difficulty with writing and typing with her right hand. MRI results indicate a 77mm infarct L [place].    Clinical Impression   [name] seen for OT evaluation this date. Prior to hospital admission, pt was independent in all aspects of ADL/IADL, working, driving, and providing care for her mother. Pt denies falls history in past 12 months. Pt lives with her spouse in a 2 story home with 3 steps to enter with R hand rail. Pt endorses already living on the first level of the home. Currently pt reporting overall symptoms have resolved. Pt demonstrates baseline independence to perform ADL and mobility tasks and no cognitive, or visual deficits appreciated with assessment. On this date, [name] reports that she feels generalize decreased sensation across her R side with slight RUE weakness and decrease in coordination noted. Overall, RUE WFL. On this date, Pt able to use her R hand to sign her name, and reports having no difficulty using RUE for ADL/IADL tasks. No skilled OT needs identified. Will sign off. Please re-consult if additional OT needs arise.     Follow Up Recommendations  No OT follow up    Equipment Recommendations       Recommendations for Other Services       Precautions / Restrictions Restrictions Weight Bearing Restrictions: No      Mobility Bed Mobility Overal bed mobility: Independent             General bed mobility comments: Pt able to complete all bed mobility independently.  Transfers Overall transfer level: Independent               General transfer comment: Pt reports ambulating within her room independently. No concerns.     Balance  Overall balance assessment: Independent                                         ADL either performed or assessed with clinical judgement   ADL Overall ADL's : Independent                                       General ADL Comments: [name] reports using the bathroom and dressing independently since admission. She does not have ADL concerns and has returned to baseline function.      Vision Baseline Vision/History: Wears glasses(Wears right contact only for distance.) Wears Glasses: Distance only Patient Visual Report: No change from baseline Vision Assessment?: No apparent visual deficits Additional Comments: Vision appears [facility]. No noted differences between right and left peripheral. Pt able to read, use cell phone, etc. with no difficulty on this date.      Perception     Praxis      Pertinent Vitals/Pain Pain Assessment: No/denies pain     Hand Dominance Right   Extremity/Trunk Assessment Upper Extremity Assessment Upper Extremity Assessment: Overall WFL for tasks assessed;RUE deficits/detail RUE Deficits / Details: [name] endorses feeling decreased sensation throughout the right side of her body including her RUE and RLE. She also stated that  she has noticed she has difficulty with texting and writing using her R hand, which is her dominant hand. Strength/coordination overall WFL, however noted differences between right and left UE including decreased speed with opposition and decreased coordinatin with finger to nose in RUE.  RUE Sensation: decreased light touch RUE Coordination: decreased fine motor   Lower Extremity Assessment Lower Extremity Assessment: Overall WFL for tasks assessed   Cervical / Trunk Assessment Cervical / Trunk Assessment: Normal   Communication Communication Communication: No difficulties   Cognition Arousal/Alertness: Awake/alert Behavior During Therapy: WFL for tasks assessed/performed Overall  Cognitive Status: Within Functional Limits for tasks assessed                                     General Comments  [name] reports feeling back to baseline function in all ADLs and functional mobility. Her only concern on this date is a slight noticeable decrease in her FM control in her RUE. She reported difficulty with writing and texting upon admission. On this date pt expressed satisfaction with being able to sign her name without difficulty. Overall FM appears to be [facility].     Exercises Other Exercises Other Exercises: Instructed pt in FM strengthening activities to use in room, RUE positioning, and encouraged functional use of RUE as much as possible to promote increased FM coordination and decrease risk of neglect/skin breakdown.    Shoulder Instructions      Home Living Family/patient expects to be discharged to:: Private residence Living Arrangements: Spouse/significant other Available Help at Discharge: Family;Available PRN/intermittently;Available 24 hours/day(Pt reports her husband could work from home if needed immediately upon DC. ) Type of Home: House Home Access: Stairs to enter [employer] of Steps: 3 Entrance Stairs-Rails: Right Home Layout: Two level;Able to live on main level with bedroom/bathroom Alternate Level Stairs-Number of Steps: Pt endorses that she does not use the upper level of her home often. She already lives on main level.   Bathroom Shower/Tub: Occupational psychologist: Standard Bathroom Accessibility: Yes How Accessible: Accessible via walker;Accessible via wheelchair Home Equipment: None          Prior Functioning/Environment Level of Independence: Independent        Comments: Lori Morales was independent in all ADL/IADLs prior to admission. She works full time and provides care for her mother.         OT Problem List: Decreased strength;Decreased coordination;Impaired sensation;Impaired UE functional  use;Decreased knowledge of use of DME or AE      OT Treatment/Interventions:      OT Goals(Current goals can be found in the care plan section) Acute Rehab OT Goals Patient Stated Goal: To feel better and go home.  OT Goal Formulation: All assessment and education complete, DC therapy Time For Goal Achievement: 06/16/18 Potential to Achieve Goals: Good  OT Frequency:     Barriers to D/C:            Co-evaluation              AM-PAC OT "6 Clicks" Daily Activity     Outcome Measure Help from another person eating meals?: None Help from another person taking care of personal grooming?: None Help from another person toileting, which includes using toliet, bedpan, or urinal?: None Help from another person bathing (including washing, rinsing, drying)?: None Help from another person to put on and taking off regular upper body clothing?:  None Help from another person to put on and taking off regular lower body clothing?: None 6 Click Score: 24   End of Session    Activity Tolerance: Patient tolerated treatment well Patient left: in bed;with call bell/phone within reach  OT Visit Diagnosis: Muscle weakness (generalized) (M62.81);Other abnormalities of gait and mobility (R26.89);Other symptoms and signs involving the nervous system (Y61.683)                Time: 7290-2111 OT Time Calculation (min): 23 min Charges:  OT General Charges $OT Visit: 1 Visit OT Evaluation $OT Eval Low Complexity: 1 Low OT Treatments $Therapeutic Exercise: 8-22 mins  Shara Blazing, M.S., OTR/L Ascom: [phone] 06/16/18, 9:33 AM

## 2018-06-16 NOTE — Plan of Care (Signed)
  Problem: Education: Goal: Knowledge of General Education information will improve Description Including pain rating scale, medication(s)/side effects and non-pharmacologic comfort measures Outcome: Progressing   Problem: Health Behavior/Discharge Planning: Goal: Ability to manage health-related needs will improve Outcome: Progressing   Problem: Clinical Measurements: Goal: Ability to maintain clinical measurements within normal limits will improve Outcome: Progressing   Problem: Safety: Goal: Ability to remain free from injury will improve Outcome: Progressing   Problem: Education: Goal: Knowledge of disease or condition will improve Outcome: Progressing Goal: Knowledge of secondary prevention will improve Outcome: Progressing   Problem: Ischemic Stroke/TIA Tissue Perfusion: Goal: Complications of ischemic stroke/TIA will be minimized Outcome: Progressing

## 2018-06-18 ENCOUNTER — Other Ambulatory Visit: Payer: Self-pay | Admitting: *Deleted

## 2018-06-18 ENCOUNTER — Encounter: Payer: Self-pay | Admitting: *Deleted

## 2018-06-18 NOTE — Patient Outreach (Signed)
[facility]) Care Management  06/18/2018  Lori Morales 12/30/1960 [id number]   Transition of care telephone call:   Subjective: Initial unsuccessful telephone call to patient's preferred number in order to complete transition of care assessment; no answer, left HIPAA compliant voicemail message requesting return call.   Objective: Per the electronic medical record, Lori Morales  was hospitalized at [facility] from 1/17-1/18/20 for an acute left thalamic stroke. Comorbidities include: arthritis, hyperlipidemia, IBS, GERD, migraine headaches, HTN,  Restless Leg Syndrome and depression She was discharged to home on 06/16/18 without the need for home health services or durable medical equipment per the discharge summary.   Assessment:   Plan: This RNCM will attempt another outreach within 4 business days and will route unsuccessful outreach letter with [place] Management pamphlet and 24 Hour Nurse Line Magnet to [place] Management clinical pool to be mailed to patient's home address.  Barrington Ellison RN,CCM,CDE [place] Management Coordinator Office Phone [phone] Office Fax [phone]

## 2018-06-19 ENCOUNTER — Other Ambulatory Visit: Payer: Self-pay | Admitting: *Deleted

## 2018-06-19 DIAGNOSIS — R739 Hyperglycemia, unspecified: Secondary | ICD-10-CM | POA: Diagnosis not present

## 2018-06-19 DIAGNOSIS — Z8673 Personal history of transient ischemic attack (TIA), and cerebral infarction without residual deficits: Secondary | ICD-10-CM | POA: Diagnosis not present

## 2018-06-19 DIAGNOSIS — E7849 Other hyperlipidemia: Secondary | ICD-10-CM | POA: Diagnosis not present

## 2018-06-19 NOTE — Patient Outreach (Signed)
[facility]) Care Management  06/19/2018  EMMAMAE MCNAMARA [birth year] [id number]   Transition of care call    Subjective: Returned call to patient after she left a message for this RNCM at 942 am this morning in response to voicemail left for her yesterday; 2 HIPAA identifiers verified. Explained purpose of call and completed transition of care assessment.  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 She says she was very surprised when she was told her MRI was positive for a stork as she says she is very active, participates in a regular exercise routine and eats a healthy diet. She says she is receptive to receiving written information on cholesterol management and preventing Type 2 diabetes     Objective:  Per the electronic medical record, Mrs. Petsch  was hospitalized at [facility] from 1/17-1/18/20 for an acute left thalamic stroke. Comorbidities include: arthritis, hyperlipidemia, IBS, GERD, migraine headaches, HTN,  Restless Leg Syndrome, prediabetes with recent Hgb A1C= 5.9% (on 06/16/18) and depression She was discharged to home on 06/16/18 without the need for home health services or durable medical equipment per the discharge summary.   Assessment:  See transition of care flowsheet for assessment details.   Plan:  Discussed Active Health Management chronic disease program offered by [facility] and verified that Devika is active with the program.  Will mail her information on strategies to lower total cholesterol and LDL,  glycemic index food guide, [facility] and [place] article on prediabetes.  No other ongoing care management needs identified so will close case to [place] Management care management services and route successful outreach letter with [place] Management pamphlet and 24 Hour Nurse Line Magnet to [place] Management clinical pool to be mailed to patient's home address.   Barrington Ellison RN,CCM,CDE [place] Management Coordinator Office Phone [phone] Office Fax [phone]

## 2018-06-21 ENCOUNTER — Ambulatory Visit: Payer: Self-pay | Admitting: *Deleted

## 2018-06-28 ENCOUNTER — Encounter: Payer: Self-pay | Admitting: Occupational Therapy

## 2018-06-28 ENCOUNTER — Ambulatory Visit: Payer: 59 | Attending: Internal Medicine | Admitting: Occupational Therapy

## 2018-06-28 DIAGNOSIS — M6281 Muscle weakness (generalized): Secondary | ICD-10-CM | POA: Diagnosis not present

## 2018-06-28 DIAGNOSIS — R278 Other lack of coordination: Secondary | ICD-10-CM | POA: Insufficient documentation

## 2018-06-29 DIAGNOSIS — Z8673 Personal history of transient ischemic attack (TIA), and cerebral infarction without residual deficits: Secondary | ICD-10-CM | POA: Diagnosis not present

## 2018-06-29 DIAGNOSIS — E7849 Other hyperlipidemia: Secondary | ICD-10-CM | POA: Diagnosis not present

## 2018-06-29 DIAGNOSIS — R739 Hyperglycemia, unspecified: Secondary | ICD-10-CM | POA: Diagnosis not present

## 2018-06-29 NOTE — Therapy (Signed)
[place] MAIN [facility] SERVICES [address] [place], [place], [address] Phone: [phone]   Fax:  [phone]  Occupational Therapy Evaluation  Patient Details  Name: Lori Morales MRN: [id number] Date of Birth: October 29, 1960 No data recorded  Encounter Date: 06/28/2018  OT End of Session - 06/29/18 1642    Visit Number  1    Number of Visits  8    Date for OT Re-Evaluation  08/23/18    OT Start Time  1000    OT Stop Time  1058    OT Time Calculation (min)  58 min    Activity Tolerance  Patient tolerated treatment well    Behavior During Therapy  [facility] for tasks assessed/performed       Past Medical History:  Diagnosis Date  . Arthritis   . Depression   . GERD (gastroesophageal reflux disease)   . Headache    migraines  . Hypertension   . IBS (irritable bowel syndrome)   . Palpitations   . PONV (postoperative nausea and vomiting)   . Restless legs syndrome     Past Surgical History:  Procedure Laterality Date  . ABDOMINOPLASTY    . BACK SURGERY     1 laminectomy, 1 fusion  . COLONOSCOPY    . COLONOSCOPY WITH PROPOFOL N/A 01/17/2017   Procedure: COLONOSCOPY WITH PROPOFOL;  Surgeon: Lollie Sails, MD;  Location: [facility] ENDOSCOPY;  Service: Endoscopy;  Laterality: N/A;  . WISDOM TOOTH EXTRACTION      There were no vitals filed for this visit.  Subjective Assessment - 06/28/18 1010    Subjective   Patient reports she Woke up the morning of 06/15/2018 and checked her phone, tried to text and had difficulty but thought it was related to sleeping medication she took the night before.  She continued to get ready and had difficulty in the shower with the conditioner and shampoo.  She decided to go to work but after a few hours her right arm felt heavy and she had difficulty typing.  She went to the ER and was admitted for CVA.      Pertinent History  Patient admitted to [facility] on 06/15/2018 after having weakness and incoordination in her  right side upon waking.  She was diagnosed with an acute left thalamic stroke.  She stayed in the hospital overnight and was discharged home.  She is planning to return to work next week.      Patient Stated Goals  Patient reports she would like to have her right hand to return to normal and be able to do all the things she did before.      Currently in Pain?  No/denies    Pain Score  0-No pain    Multiple Pain Sites  No        [facility] OT Assessment - 06/28/18 1010      Assessment   Medical Diagnosis  acute left thalamic stroke.      Onset Date/Surgical Date  06/15/18    Hand Dominance  Right    Prior Therapy  evaluations in acute setting      Precautions   Precautions  None      Restrictions   Weight Bearing Restrictions  No      Balance Screen   Has the patient fallen in the past 6 months  No    Has the patient had a decrease in activity level because of a fear of falling?   No  Is the patient reluctant to leave their home because of a fear of falling?   No      Home  Environment   Family/patient expects to be discharged to:  Private residence    Living Arrangements  Spouse/significant other    Available Help at Discharge  Family    Type of [place]  None    Lives With  Spouse      Prior Function   Level of Independence  Independent    Vocation  Full time employment      ADL   Eating/Feeding  Modified independent    Grooming  Modified independent    Upper Body Bathing  Independent    Lower Body Bathing  Independent    Upper Body Dressing  Increased time    Lower Body Dressing  Modified independent    Banker - Civil engineer, contracting  Modified independent    ADL comments  Patient is currently independent with her basic self care tasks, reports right arm feels heavy at times and a bit slower towards the end of  the day.  She has not returned to work yet and plans to next week.  She will need to be able to use a computer, use of mouse and be able to perform typing to complete payroll and other work tasks.  She has had difficulty with texting since the incident,typing and reports writing feels "weird".  She has engaged in limited home management tasks but has not returned to full function.        IADL   Prior Level of Function Shopping  independent    Prior Level of Function Light Housekeeping  independent    Prior Level of Function Meal Prep  independent    Meal Prep  Able to complete simple warm meal prep    Prior Level of Function Community Mobility  independent    Community Mobility  Drives own vehicle    Prior Level of Function Medication Managment  independent    Medication Management  Is responsible for taking medication in correct dosages at correct time    Prior Level of Function Financial Management  independent      Mobility   Mobility Status  Independent      Written Expression   Dominant Hand  Right      Vision - History   Additional Comments  denies any visual changes      Cognition   Overall Cognitive Status  Within Functional Limits for tasks assessed      Sensation   Light Touch  Appears Intact    Stereognosis  Appears Intact    Hot/Cold  Appears Intact    Proprioception  Appears Intact      Coordination   Gross Motor Movements are Fluid and Coordinated  Yes    Fine Motor Movements are Fluid and Coordinated  Yes    Finger Nose Finger Test  intact    9 Hole Peg Test  Right;Left    Right 9 Hole Peg Test  20 secs    Left 9 Hole Peg Test  19 secs      ROM / Strength   AROM / PROM / Strength  AROM;Strength      AROM   Overall AROM   Within functional limits  for tasks performed    AROM Assessment Site  --    Right/Left Shoulder  --      Strength   Overall Strength  Deficits    Overall Strength Comments  mild muscle weakness in right wrist 4/5      Hand Function    Right Hand Grip (lbs)  45    Right Hand Lateral Pinch  15 lbs    Right Hand 3 Point Pinch  10 lbs    Left Hand Grip (lbs)  55    Left Hand Lateral Pinch  17 lbs    Left 3 point pinch  16 lbs    Comment  right 9# 2 point pinch, left 12#       difficulty noted with manipulation of coins, translatory movements of the hand, using the hand for storage.   Patient became emotionally labile during session and states, "I don't think it has fully hit me yet."    Discussed her return to work next week, she would likely perform best if she returns 1/2 days for the first week. 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   Discussed pacing self, doing as much as possible ahead of time, asking for assistance as needed, and working towards a deadline.    Also suggested patient consider use of EACP for counseling if needed.                 OT Education - 06/29/18 1640    Education Details  plan of care, goals, fine motor coordination and activity modification    Person(s) Educated  Patient    Methods  Explanation    Comprehension  Verbalized understanding          OT Long Term Goals - 06/29/18 1649      OT LONG TERM GOAL #1   Title  Patient will complete home exercise program with modified independence.     Baseline  no current program at eval     Time  8    Period  Weeks    Status  New    Target Date  08/23/18      OT LONG TERM GOAL #2   Title  Patient will demonstrate being able to text 5 messages without difficulty with good speed and 100% accuracy.      Baseline  difficulty at eval     Time  8    Period  Weeks    Status  New    Target Date  08/23/18      OT LONG TERM GOAL #3   Title  Patient will improve right wrist strength to 5/5 to be able to perform all necessary work tasks.      Baseline  4/5 strength in right wrist.     Time  8    Period  Weeks    Status  New    Target Date  08/23/18      OT LONG  TERM GOAL #4   Title  Patient will improve right grip strength by 5# to be able to open all jars and containers.     Baseline  45# on right at eval     Time  8    Period  Weeks    Status  New    Target Date  08/23/18      OT LONG TERM GOAL #5   Title  Patient will be able to type for up  to 8 mins without a rest break and no hand fatigue.    Baseline  able to type 3 mins before hand fatigue on the right at eval.     Time  8    Period  Weeks    Status  New    Target Date  08/23/18      Long Term Additional Goals   Additional Long Term Goals  Yes      OT LONG TERM GOAL #6   Title  Patient will complete handwriting of one paragraph without hand fatigue and with 90% legibility.     Baseline  able to write short words/sentences but hand begins to fatigue.     Time  8    Period  Weeks    Status  New    Target Date  08/23/18            Plan - 06/29/18 1643    Clinical Impression Statement  Patient is a 58 yo female who was diagnosed with acute left thalamic stroke on 06/15/2018.  Patient was seen this date for OT evaluation and many of her symptoms have resolved.  With examination, patient demonstrates mild right wrist and hand weakness, decreased endurance to repeated right hand tasks, mild incoordination especially with translatory skills of the right hand and using the hand for storage.  She has difficulty with text messaging, typing and manipulation of small objects which are a part of her job responsibilities. Patient would benefit from skilled OT services to maximize independence in necessary daily tasks.      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     Occupational performance deficits (Please refer to evaluation for details):  IADL's;Work;ADL's    Rehab Potential  Excellent    Current Impairments/barriers affecting  progress:  positive: family support, motivation, mild deficits    OT Frequency  1x / week    OT Duration  8 weeks    OT Treatment/Interventions  Self-care/ADL training;Therapeutic exercise;Neuromuscular education;Moist Heat;Therapeutic activities;Patient/family education    Clinical Decision Making  Limited treatment options, no task modification necessary    Consulted and Agree with Plan of Care  Patient       Patient will benefit from skilled therapeutic intervention in order to improve the following deficits and impairments:  Impaired UE functional use, Decreased strength, Decreased coordination, Decreased activity tolerance  Visit Diagnosis: Muscle weakness (generalized)  Other lack of coordination    Problem List Patient Active Problem List   Diagnosis Date Noted  . Acute CVA (cerebrovascular accident) ([place]) 06/15/2018  . Depression 10/16/2017  . Migraine 10/16/2017  . Hyperglycemia, unspecified 07/10/2017  . Hyperglycemia 07/10/2017  . H/O dizziness 08/02/2016  . Cervical disc disorder with radiculopathy of cervical region 04/12/2016  . Nonallopathic lesion of thoracic region 04/12/2016  . Nonallopathic lesion of rib cage 04/12/2016  . Facet arthritis of cervical region 03/21/2016  . Hyperlipidemia 01/26/2015  . Labral tear of shoulder, right, subsequent encounter 12/04/2014  . 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 Impingement syndrome of right shoulder 05/28/2014  . Right supraspinatus tenosynovitis 05/28/2014  . Arthritis pain 04/03/2014  . Fatigue 04/03/2014  . 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 IBS (irritable bowel syndrome) 06/20/2012  . Lumbar disc disease 06/20/2012   Amy T Lovett, OTR/L, CLT  Lovett,Amy 06/29/2018, 4:58 PM  [place] [facility]  SERVICES [place], [place], [address] Phone: [phone]   Fax:  [phone]  Name: Lori Morales MRN: [id number] Date of Birth: [birth year]

## 2018-07-04 ENCOUNTER — Encounter: Payer: Self-pay | Admitting: Occupational Therapy

## 2018-07-05 ENCOUNTER — Encounter: Payer: Self-pay | Admitting: Speech Pathology

## 2018-07-05 ENCOUNTER — Ambulatory Visit: Payer: 59 | Attending: Internal Medicine | Admitting: Speech Pathology

## 2018-07-05 DIAGNOSIS — R278 Other lack of coordination: Secondary | ICD-10-CM | POA: Diagnosis not present

## 2018-07-05 DIAGNOSIS — M6281 Muscle weakness (generalized): Secondary | ICD-10-CM | POA: Diagnosis not present

## 2018-07-05 DIAGNOSIS — R471 Dysarthria and anarthria: Secondary | ICD-10-CM | POA: Diagnosis not present

## 2018-07-05 DIAGNOSIS — I639 Cerebral infarction, unspecified: Secondary | ICD-10-CM | POA: Diagnosis not present

## 2018-07-05 NOTE — Therapy (Signed)
[place] MAIN [facility] SERVICES [address] [place], [place], [address] Phone: [phone]   Fax:  [phone]  Speech Language Pathology Evaluation  Patient Details  Name: Lori Morales MRN: [id number] Date of Birth: 08/21/1960 No data recorded  Encounter Date: 07/05/2018  End of Session - 07/05/18 1323    Visit Number  1    Number of Visits  1    SLP Start Time  1113   Pt arrived late due to being detained at MD appt   SLP Stop Time   1200    SLP Time Calculation (min)  47 min       Past Medical History:  Diagnosis Date  . Arthritis   . Depression   . GERD (gastroesophageal reflux disease)   . Headache    migraines  . Hypertension   . IBS (irritable bowel syndrome)   . Palpitations   . PONV (postoperative nausea and vomiting)   . Restless legs syndrome     Past Surgical History:  Procedure Laterality Date  . ABDOMINOPLASTY    . BACK SURGERY     1 laminectomy, 1 fusion  . COLONOSCOPY    . COLONOSCOPY WITH PROPOFOL N/A 01/17/2017   Procedure: COLONOSCOPY WITH PROPOFOL;  Surgeon: Lollie Sails, MD;  Location: [facility] ENDOSCOPY;  Service: Endoscopy;  Laterality: N/A;  . WISDOM TOOTH EXTRACTION      There were no vitals filed for this visit.  Subjective Assessment - 07/05/18 1258    Subjective  Pt was very pleasant and cooperative with all evaluation tasks, completing tasks with attention to detail and completeness.    Currently in Pain?  No/denies         SLP Evaluation [facility] - 07/05/18 1258      SLP Visit Information   SLP Received On  07/05/18    Onset Date  06/16/2018    Medical Diagnosis  [place] CVA      Subjective   Patient/Family Stated Goal  To check and see if there are any remaining deficits s/p CVA      General Information   HPI Patient reports she woke up the morning of 06/15/2018 and checked her phone, tried to text and had difficulty but thought it was related to sleeping medication she took the  night before.  She continued to get ready and had difficulty in the shower with the conditioner and shampoo.  She decided to go to work but after a few hours her right arm felt heavy and she had difficulty typing.  She went to the ER and was admitted for CVA. Pt did not present with s/s aphasia, dysarthria, or dysphagia throughout hospital admission; however after returning home for the past few weeks husband reported he doesn't feel pt has completely returned to baseline. Pt and husband report she has some difficulty finding her words when stressed or tired.   Behavioral/Cognition  WFL      Prior Functional Status   Cognitive/Linguistic Baseline  Within functional limits     Lives With  Spouse    Available Support  Family      Cognition   Overall Cognitive Status  Within Functional Limits for tasks assessed      Auditory Comprehension   Overall Auditory Comprehension  Appears within functional limits for tasks assessed      Reading Comprehension   Reading Status  Within funtional limits      Expression   Primary Mode of Expression  Verbal      Verbal Expression   Overall Verbal Expression  Appears within functional limits for tasks assessed      Written Expression   Dominant Hand  Right    Written Expression  Within Functional Limits      Oral Motor/Sensory Function   Overall Oral Motor/Sensory Function  Appears within functional limits for tasks assessed      Motor Speech   Overall Motor Speech  Appears within functional limits for tasks assessed, spontaneous speech 100% intelligible and fluent  Very mild intermittent imprecision of articulatory accuracy     Standardized Assessments   Standardized Assessments   Cognitive Linguistic Quick Test      Cognitive Linguistic Quick Test (Ages 18-69)   Attention  WNL    Memory  WNL    Executive Function  WNL    Language  WNL    Visuospatial Skills  WNL    Severity Rating Total  20    Composite Severity Rating  16.8 WNL         SLP Education - 07/05/18 1321    Education Details  Reviewed & discussed results of Speech-Language assessment which revealed all areas of speech, language, cognition WFL. Educated re: use of overarticulation to improve precision of articulatory accuracy when needed.    Person(s) Educated  Patient    Methods  Explanation    Comprehension  Verbalized understanding           Plan - 07/05/18 1323    Clinical Impression Statement  This very pleasant 58 y/o female presents with functional receptive & expressive language and cognitive linguistic skills. All areas of speech, language and cognition are assessed to be [facility]. Spontaneous speech was 100% intelligible, well organized, grammatically appropriate and fluent. Occasional mild imprecision of articulatory accuracy noted with sibilants (ie. /s/); however this imprecision was intermittent, very mild and did not affect intelligibility of speech at the conversational level. Pt reports she notices this very mild imprecision at times also when she is tired and/or stressed. Educated pt re:use of overarticulation to improve articulatory precision. Skilled ST tx is not indicated at this time.    Speech Therapy Frequency  One time visit, Eval only   Duration  Other (comment)   No additional visits indicated   Consulted and Agree with Plan of Care  Patient       Patient will benefit from skilled therapeutic intervention in order to improve the following deficits and impairments:   Dysarthria and anarthria    Problem List Patient Active Problem List   Diagnosis Date Noted  . Acute CVA (cerebrovascular accident) ([place]) 06/15/2018  . Depression 10/16/2017  . Migraine 10/16/2017  . Hyperglycemia, unspecified 07/10/2017  . Hyperglycemia 07/10/2017  . H/O dizziness 08/02/2016  . Cervical disc disorder with radiculopathy of cervical region 04/12/2016  . Nonallopathic lesion of thoracic region 04/12/2016  . Nonallopathic lesion of rib cage  04/12/2016  . Facet arthritis of cervical region 03/21/2016  . Hyperlipidemia 01/26/2015  . Labral tear of shoulder, right, subsequent encounter 12/04/2014  . 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 Impingement syndrome of right shoulder 05/28/2014  . Right supraspinatus tenosynovitis 05/28/2014  . Arthritis pain 04/03/2014  . Fatigue 04/03/2014  . 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 IBS (irritable bowel syndrome) 06/20/2012  . Lumbar disc disease 06/20/2012    Rulon Eisenmenger, MA,  CCC-SLP 07/05/2018, 1:30 PM  [place] MAIN [facility] SERVICES [address] [place], [place], [address] Phone: [phone]  Fax:  [phone]  Name: Lori Morales MRN: [id number] Date of Birth: [birth year]

## 2018-07-06 ENCOUNTER — Ambulatory Visit: Payer: 59 | Admitting: Occupational Therapy

## 2018-07-10 ENCOUNTER — Ambulatory Visit: Payer: 59 | Admitting: Occupational Therapy

## 2018-07-10 DIAGNOSIS — M6281 Muscle weakness (generalized): Secondary | ICD-10-CM

## 2018-07-10 DIAGNOSIS — R278 Other lack of coordination: Secondary | ICD-10-CM

## 2018-07-10 DIAGNOSIS — R471 Dysarthria and anarthria: Secondary | ICD-10-CM | POA: Diagnosis not present

## 2018-07-12 ENCOUNTER — Ambulatory Visit: Payer: 59 | Admitting: Occupational Therapy

## 2018-07-12 ENCOUNTER — Encounter: Payer: Self-pay | Admitting: Occupational Therapy

## 2018-07-14 ENCOUNTER — Encounter: Payer: Self-pay | Admitting: Occupational Therapy

## 2018-07-14 NOTE — Therapy (Signed)
[place] MAIN [facility] SERVICES [address] [place], [place], [address] Phone: [phone]   Fax:  [phone]  Occupational Therapy Treatment  Patient Details  Name: Lori Morales MRN: [id number] Date of Birth: 10-16-1960 No data recorded  Encounter Date: 07/10/2018  OT End of Session - 07/14/18 0908    Visit Number  2    Number of Visits  8    Date for OT Re-Evaluation  08/23/18    OT Start Time  0835    OT Stop Time  0915    OT Time Calculation (min)  40 min    Activity Tolerance  Patient tolerated treatment well    Behavior During Therapy  [facility] for tasks assessed/performed       Past Medical History:  Diagnosis Date  . Arthritis   . Depression   . GERD (gastroesophageal reflux disease)   . Headache    migraines  . Hypertension   . IBS (irritable bowel syndrome)   . Palpitations   . PONV (postoperative nausea and vomiting)   . Restless legs syndrome     Past Surgical History:  Procedure Laterality Date  . ABDOMINOPLASTY    . BACK SURGERY     1 laminectomy, 1 fusion  . COLONOSCOPY    . COLONOSCOPY WITH PROPOFOL N/A 01/17/2017   Procedure: COLONOSCOPY WITH PROPOFOL;  Surgeon: Lollie Sails, MD;  Location: [facility] ENDOSCOPY;  Service: Endoscopy;  Laterality: N/A;  . WISDOM TOOTH EXTRACTION      There were no vitals filed for this visit.  Subjective Assessment - 07/14/18 0908    Subjective   Patient reports she had difficulty with putting in contacts this am, using small makeup brush and mascara    Pertinent History  Patient admitted to [facility] on 06/15/2018 after having weakness and incoordination in her right side upon waking.  She was diagnosed with an acute left thalamic stroke.  She stayed in the hospital overnight and was discharged home.  She is planning to return to work next week.      Patient Stated Goals  Patient reports she would like to have her right hand to return to normal and be able to do all the things she did  before.      Currently in Pain?  No/denies    Pain Score  0-No pain        ADLs:Patient seen for focus on hand writing to compose a paragraph with 5 to 6 sentences. Patient able to complete with mild hand fatigue and no rest breaks. Patient seen for manipulation of small earring backs to pick up and sort into a container. Small pieces were less than quarter inch in size   Neuromuscular Reeducation: Use of dowel stick for focus on translatory skills of the hand moving the fingers up and down of the stick while maintaining a tripod grasp, verbal cues and therapist demonstration required. Use of cards for focus on speed and dexterity with thumb and finger combinations with the cards. Instructed on this exercise to perform at home with ways to sort and vary the activity while focusing on increasing speed.   Therapeutic Exercise: Patient seen for resistive putty exercises with two containers of green putty and use of variety of hand tools and grips. L bar used for pinch and Pull, key pinch to utilized as well as small round knob. hand fatigue noted with exercises and required short rest breaks as well as cues for technique and grip onto  tools.      Patient continues to make excellent progress with refining fine motor coordination skills, improving hand strength, and improving functional use of right hand for necessary daily activities. Patient continues to demonstrate mild impairment in these areas and would benefit from continued therapy one time a week for the next few weeks.                 OT Education - 07/14/18 0908    Education Details  translatory movements of the hand, [facility]    Person(s) Educated  Patient    Methods  Explanation;Demonstration    Comprehension  Verbalized understanding;Returned demonstration          OT Long Term Goals - 06/29/18 1649      OT LONG TERM GOAL #1   Title  Patient will complete home exercise program with modified independence.      Baseline  no current program at eval     Time  8    Period  Weeks    Status  New    Target Date  08/23/18      OT LONG TERM GOAL #2   Title  Patient will demonstrate being able to text 5 messages without difficulty with good speed and 100% accuracy.      Baseline  difficulty at eval     Time  8    Period  Weeks    Status  New    Target Date  08/23/18      OT LONG TERM GOAL #3   Title  Patient will improve right wrist strength to 5/5 to be able to perform all necessary work tasks.      Baseline  4/5 strength in right wrist.     Time  8    Period  Weeks    Status  New    Target Date  08/23/18      OT LONG TERM GOAL #4   Title  Patient will improve right grip strength by 5# to be able to open all jars and containers.     Baseline  45# on right at eval     Time  8    Period  Weeks    Status  New    Target Date  08/23/18      OT LONG TERM GOAL #5   Title  Patient will be able to type for up to 8 mins without a rest break and no hand fatigue.    Baseline  able to type 3 mins before hand fatigue on the right at eval.     Time  8    Period  Weeks    Status  New    Target Date  08/23/18      Long Term Additional Goals   Additional Long Term Goals  Yes      OT LONG TERM GOAL #6   Title  Patient will complete handwriting of one paragraph without hand fatigue and with 90% legibility.     Baseline  able to write short words/sentences but hand begins to fatigue.     Time  8    Period  Weeks    Status  New    Target Date  08/23/18            Plan - 07/14/18 0909    Clinical Impression Statement  Patient continues to make excellent progress with refining fine motor coordination skills, improving hand strength, and improving functional use of right hand for  necessary daily activities. Patient continues to demonstrate mild impairment in these areas and would benefit from continued therapy one time a week for the next few weeks.    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     Occupational performance deficits (Please refer to evaluation for details):  IADL's;Work;ADL's    Rehab Potential  Excellent    Current Impairments/barriers affecting progress:  positive: family support, motivation, mild deficits    OT Frequency  1x / week    OT Duration  8 weeks    OT Treatment/Interventions  Self-care/ADL training;Therapeutic exercise;Neuromuscular education;Moist Heat;Therapeutic activities;Patient/family education    Consulted and Agree with Plan of Care  Patient       Patient will benefit from skilled therapeutic intervention in order to improve the following deficits and impairments:  Impaired UE functional use, Decreased strength, Decreased coordination, Decreased activity tolerance  Visit Diagnosis: Muscle weakness (generalized)  Other lack of coordination    Problem List Patient Active Problem List   Diagnosis Date Noted  . Acute CVA (cerebrovascular accident) ([place]) 06/15/2018  . Depression 10/16/2017  . Migraine 10/16/2017  . Hyperglycemia, unspecified 07/10/2017  . Hyperglycemia 07/10/2017  . H/O dizziness 08/02/2016  . Cervical disc disorder with radiculopathy of cervical region 04/12/2016  . Nonallopathic lesion of thoracic region 04/12/2016  . Nonallopathic lesion of rib cage 04/12/2016  . Facet arthritis of cervical region 03/21/2016  . Hyperlipidemia 01/26/2015  . Labral tear of shoulder, right, subsequent encounter 12/04/2014  . 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 Impingement syndrome of right shoulder 05/28/2014  . Right supraspinatus tenosynovitis 05/28/2014  . Arthritis pain 04/03/2014  . Fatigue 04/03/2014  . 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 IBS (irritable bowel syndrome) 06/20/2012  . Lumbar disc disease 06/20/2012   Achilles Dunk, OTR/L,  CLT  Lovett,Amy 07/14/2018, 3:47 PM  [place] MAIN [facility] SERVICES [address] [place], [place], [address] Phone: [phone]   Fax:  [phone]  Name: Lori Morales MRN: [id number] Date of Birth: [birth year]

## 2018-07-18 ENCOUNTER — Ambulatory Visit: Payer: 59 | Admitting: Occupational Therapy

## 2018-07-18 DIAGNOSIS — M6281 Muscle weakness (generalized): Secondary | ICD-10-CM

## 2018-07-18 DIAGNOSIS — R278 Other lack of coordination: Secondary | ICD-10-CM | POA: Diagnosis not present

## 2018-07-18 DIAGNOSIS — R471 Dysarthria and anarthria: Secondary | ICD-10-CM | POA: Diagnosis not present

## 2018-07-20 ENCOUNTER — Ambulatory Visit: Payer: 59 | Admitting: Occupational Therapy

## 2018-07-20 ENCOUNTER — Encounter: Payer: Self-pay | Admitting: Occupational Therapy

## 2018-07-20 NOTE — Therapy (Signed)
[place] MAIN [facility] SERVICES [address] [place], [place], [address] Phone: [phone]   Fax:  [phone]  Occupational Therapy Treatment  Patient Details  Name: Lori Morales MRN: [id number] Date of Birth: 1961-03-25 No data recorded  Encounter Date: 07/18/2018  OT End of Session - 07/20/18 1203    Visit Number  3    Number of Visits  8    Date for OT Re-Evaluation  08/23/18    OT Start Time  0833    OT Stop Time  0916    OT Time Calculation (min)  43 min    Activity Tolerance  Patient tolerated treatment well    Behavior During Therapy  [facility] for tasks assessed/performed       Past Medical History:  Diagnosis Date  . Arthritis   . Depression   . GERD (gastroesophageal reflux disease)   . Headache    migraines  . Hypertension   . IBS (irritable bowel syndrome)   . Palpitations   . PONV (postoperative nausea and vomiting)   . Restless legs syndrome     Past Surgical History:  Procedure Laterality Date  . ABDOMINOPLASTY    . BACK SURGERY     1 laminectomy, 1 fusion  . COLONOSCOPY    . COLONOSCOPY WITH PROPOFOL N/A 01/17/2017   Procedure: COLONOSCOPY WITH PROPOFOL;  Surgeon: Lollie Sails, MD;  Location: [facility] ENDOSCOPY;  Service: Endoscopy;  Laterality: N/A;  . WISDOM TOOTH EXTRACTION      There were no vitals filed for this visit.  Subjective Assessment - 07/20/18 1201    Subjective   Patient reports she has continued to experience slow steady results in hand function, still has difficulty with using small brush for eye makeup.      Pertinent History  Patient admitted to [facility] on 06/15/2018 after having weakness and incoordination in her right side upon waking.  She was diagnosed with an acute left thalamic stroke.  She stayed in the hospital overnight and was discharged home.  She is planning to return to work next week.      Patient Stated Goals  Patient reports she would like to have her right hand to return to  normal and be able to do all the things she did before.      Currently in Pain?  No/denies    Pain Score  0-No pain       Therex: Reassessment of skills: Grip strength  Right 56# Left 60# Pinch skills Lateral right 17# Left 17.5# 3 point pinch Right 17# Left 20# 2 point pinch  Right 11# Left 11# 9 hole peg test 22 sec right Red resistive theraputty for wrist extension exercises for 10 repetitions for 2 sets with cues for technique.   Yellow resistive putty for tripod grasp, cues for using fingers for movement and not wrist, followed by extension pattern (same movement as outlined above), multiple trials completed with short rest breaks as needed.      Neuromuscular reeducation:  RUE Patient seen this date for focus on prehension patterns with index, thumb and middle finger to work towards fine motor control patterns to pick up and place small pegs into grid with cues to flex at PIP and IP joints really targeting this motion required to control and redevelop handwriting skills.   Difficulty with small pegs to target the motion so graded task to use round ball pegs to increase surface area of object for fingers, verbal cues and  occasional therapist guiding through the motion to establish movement pattern.   Manipulation of golf ball, holding in supinated hand position and attempting to move ball up into a full finger perched position.  Also performed with soft spongy like ball.  Patient requires cues from therapist as well as tactile cues for facilitation of proper movements of the fingers.     Red resistive theraputty for wrist extension exercises for 10 repetitions for 2 sets with cues for technique.   Yellow resistive putty for tripod grasp, cues for using fingers for movement and not wrist, followed by extension pattern (same movement as outlined above), multiple trials completed with short rest breaks as needed.     Patient has improved grip strength significantly and has made  improvements with typing, handwriting and functional use of the right hand.   She still demonstrates hand fatigue at times with tasks and is focusing on refining hand skills for coordination and manipulation of objects.                  OT Education - 07/20/18 1202    Education Details  translatory movements of the hand, [place], isolated finger movements and patterns to reinforce handwriting.     Person(s) Educated  Patient    Methods  Explanation;Demonstration    Comprehension  Verbalized understanding;Returned demonstration          OT Long Term Goals - 07/20/18 1204      OT LONG TERM GOAL #1   Title  Patient will complete home exercise program with modified independence.     Baseline  no current program at eval     Time  8    Period  Weeks    Status  On-going    Target Date  08/23/18      OT LONG TERM GOAL #2   Title  Patient will demonstrate being able to text 5 messages without difficulty with good speed and 100% accuracy.      Baseline  difficulty at eval     Time  8    Period  Weeks    Status  On-going    Target Date  08/23/18      OT LONG TERM GOAL #3   Title  Patient will improve right wrist strength to 5/5 to be able to perform all necessary work tasks.      Baseline  4/5 strength in right wrist.     Time  8    Period  Weeks    Status  On-going    Target Date  08/23/18      OT LONG TERM GOAL #4   Title  Patient will improve right grip strength by 5# to be able to open all jars and containers.     Baseline  45# on right at eval     Time  8    Period  Weeks    Status  Achieved      OT LONG TERM GOAL #5   Title  Patient will be able to type for up to 8 mins without a rest break and no hand fatigue.    Baseline  able to type 3 mins before hand fatigue on the right at eval.     Time  8    Period  Weeks    Status  On-going    Target Date  08/23/18      OT LONG TERM GOAL #6   Title  Patient will complete handwriting of one paragraph  without hand  fatigue and with 90% legibility.     Baseline  able to write short words/sentences but hand begins to fatigue.     Time  8    Period  Weeks    Status  On-going    Target Date  08/23/18            Plan - 07/20/18 1203    Clinical Impression Statement  Patient has improved grip strength significantly and has made improvements with typing, handwriting and functional use of the right hand.   She still demonstrates hand fatigue at times with tasks and is focusing on refining hand skills for coordination and manipulation of objects.  Continued progress in all areas and is planning to continue with exercises at home over the next week.     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     Occupational performance deficits (Please refer to evaluation for details):  IADL's;Work;ADL's    Rehab Potential  Excellent    Current Impairments/barriers affecting progress:  positive: family support, motivation, mild deficits    OT Frequency  1x / week    OT Duration  8 weeks    OT Treatment/Interventions  Self-care/ADL training;Therapeutic exercise;Neuromuscular education;Moist Heat;Therapeutic activities;Patient/family education    Consulted and Agree with Plan of Care  Patient       Patient will benefit from skilled therapeutic intervention in order to improve the following deficits and impairments:  Impaired UE functional use, Decreased strength, Decreased coordination, Decreased activity tolerance  Visit Diagnosis: Muscle weakness (generalized)  Other lack of coordination    Problem List Patient Active Problem List   Diagnosis Date Noted  . Acute CVA (cerebrovascular accident) ([place]) 06/15/2018  . Depression 10/16/2017  . Migraine 10/16/2017  . Hyperglycemia, unspecified 07/10/2017  . Hyperglycemia 07/10/2017  . H/O dizziness  08/02/2016  . Cervical disc disorder with radiculopathy of cervical region 04/12/2016  . Nonallopathic lesion of thoracic region 04/12/2016  . Nonallopathic lesion of rib cage 04/12/2016  . Facet arthritis of cervical region 03/21/2016  . Hyperlipidemia 01/26/2015  . Labral tear of shoulder, right, subsequent encounter 12/04/2014  . 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 Impingement syndrome of right shoulder 05/28/2014  . Right supraspinatus tenosynovitis 05/28/2014  . Arthritis pain 04/03/2014  . Fatigue 04/03/2014  . 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 IBS (irritable bowel syndrome) 06/20/2012  . Lumbar disc disease 06/20/2012   Achilles Dunk, OTR/L, CLT  Rexanna Louthan 07/20/2018, 12:15 PM  [place] MAIN [facility] SERVICES [address] [place], [place], [address] Phone: [phone]   Fax:  [phone]  Name: LATESSA TILLIS MRN: [id number] Date of Birth: [birth year]

## 2018-07-24 ENCOUNTER — Ambulatory Visit: Payer: 59 | Admitting: Occupational Therapy

## 2018-07-27 ENCOUNTER — Ambulatory Visit: Payer: 59 | Admitting: Occupational Therapy

## 2018-07-31 ENCOUNTER — Ambulatory Visit: Payer: 59 | Admitting: Occupational Therapy

## 2018-08-02 ENCOUNTER — Encounter: Payer: Self-pay | Admitting: Occupational Therapy

## 2018-08-07 ENCOUNTER — Encounter: Payer: Self-pay | Admitting: Occupational Therapy

## 2018-08-09 ENCOUNTER — Encounter: Payer: Self-pay | Admitting: Occupational Therapy

## 2018-08-13 ENCOUNTER — Encounter: Payer: Self-pay | Admitting: Occupational Therapy

## 2018-08-14 ENCOUNTER — Encounter: Payer: Self-pay | Admitting: Occupational Therapy

## 2018-08-14 DIAGNOSIS — E538 Deficiency of other specified B group vitamins: Secondary | ICD-10-CM | POA: Diagnosis not present

## 2018-08-14 DIAGNOSIS — E7849 Other hyperlipidemia: Secondary | ICD-10-CM | POA: Diagnosis not present

## 2018-08-14 DIAGNOSIS — R739 Hyperglycemia, unspecified: Secondary | ICD-10-CM | POA: Diagnosis not present

## 2018-08-16 ENCOUNTER — Encounter: Payer: Self-pay | Admitting: Occupational Therapy

## 2018-08-16 DIAGNOSIS — G4733 Obstructive sleep apnea (adult) (pediatric): Secondary | ICD-10-CM | POA: Diagnosis not present

## 2018-08-20 DIAGNOSIS — Z0000 Encounter for general adult medical examination without abnormal findings: Secondary | ICD-10-CM | POA: Diagnosis not present

## 2018-08-20 DIAGNOSIS — M519 Unspecified thoracic, thoracolumbar and lumbosacral intervertebral disc disorder: Secondary | ICD-10-CM | POA: Diagnosis not present

## 2018-08-20 DIAGNOSIS — G43909 Migraine, unspecified, not intractable, without status migrainosus: Secondary | ICD-10-CM | POA: Diagnosis not present

## 2018-08-20 DIAGNOSIS — Z8673 Personal history of transient ischemic attack (TIA), and cerebral infarction without residual deficits: Secondary | ICD-10-CM | POA: Diagnosis not present

## 2018-08-21 ENCOUNTER — Encounter: Payer: Self-pay | Admitting: Occupational Therapy

## 2018-08-23 ENCOUNTER — Encounter: Payer: Self-pay | Admitting: Occupational Therapy

## 2018-08-28 ENCOUNTER — Encounter: Payer: Self-pay | Admitting: Occupational Therapy

## 2018-08-30 ENCOUNTER — Encounter: Payer: Self-pay | Admitting: Occupational Therapy

## 2018-09-11 MED FILL — PRAVASTATIN SODIUM 10 MG TA: 10 | 90 days supply | Qty: 90 | Fill #0

## 2018-09-11 MED FILL — AMITRIPTYLINE HCL 10 MG TAB: 10 | 30 days supply | Qty: 30 | Fill #0

## 2018-09-11 MED FILL — PANTOPRAZOLE SOD DR 40 MG T: 40 | 30 days supply | Qty: 30 | Fill #0

## 2018-09-11 MED FILL — CELECOXIB 200 MG CAPSULE: 200 | 30 days supply | Qty: 30 | Fill #0

## 2018-09-12 MED FILL — ZOLPIDEM TART ER 6.25 MG TA: 6.25 | 30 days supply | Qty: 30 | Fill #0

## 2018-10-06 MED FILL — PANTOPRAZOLE SOD DR 40 MG T: 40 | 30 days supply | Qty: 30 | Fill #1

## 2018-10-10 MED FILL — ZOLPIDEM TART ER 6.25 MG TA: 6.25 | 30 days supply | Qty: 30 | Fill #1

## 2018-10-16 DIAGNOSIS — Z1239 Encounter for other screening for malignant neoplasm of breast: Secondary | ICD-10-CM | POA: Diagnosis not present

## 2018-10-16 DIAGNOSIS — N94819 Vulvodynia, unspecified: Secondary | ICD-10-CM | POA: Diagnosis not present

## 2018-10-16 DIAGNOSIS — N951 Menopausal and female climacteric states: Secondary | ICD-10-CM | POA: Diagnosis not present

## 2018-10-16 DIAGNOSIS — Z01419 Encounter for gynecological examination (general) (routine) without abnormal findings: Secondary | ICD-10-CM | POA: Diagnosis not present

## 2018-10-18 DIAGNOSIS — Z8673 Personal history of transient ischemic attack (TIA), and cerebral infarction without residual deficits: Secondary | ICD-10-CM | POA: Diagnosis not present

## 2018-10-18 DIAGNOSIS — I10 Essential (primary) hypertension: Secondary | ICD-10-CM | POA: Diagnosis not present

## 2018-10-25 MED FILL — CELECOXIB 200 MG CAP: 200 | 30 days supply | Qty: 30 | Fill #1

## 2018-11-02 MED FILL — PANTOPRAZOLE SOD DR 40 MG T: 40 | 30 days supply | Qty: 30 | Fill #2

## 2018-11-02 MED FILL — LOSARTAN POTASSIUM 25 MG TA: 25 | 30 days supply | Qty: 30 | Fill #0

## 2018-11-02 MED FILL — PRAMIPEXOLE 0.25 MG TABLET: 0.25 | 90 days supply | Qty: 90 | Fill #0

## 2018-11-20 MED FILL — ZOLPIDEM TART ER 6.25 MG TA: 6.25 | 30 days supply | Qty: 30 | Fill #0

## 2018-11-20 MED FILL — CELECOXIB 200 MG CAP: 200 | 90 days supply | Qty: 90 | Fill #0

## 2018-11-26 MED FILL — LOSARTAN POTASSIUM 25 MG TA: 25 | 30 days supply | Qty: 30 | Fill #1

## 2018-11-30 MED FILL — PANTOPRAZOLE SOD DR 40 MG T: 40 | 30 days supply | Qty: 30 | Fill #3

## 2018-12-04 DIAGNOSIS — E611 Iron deficiency: Secondary | ICD-10-CM | POA: Diagnosis not present

## 2018-12-04 DIAGNOSIS — I10 Essential (primary) hypertension: Secondary | ICD-10-CM | POA: Diagnosis not present

## 2018-12-04 DIAGNOSIS — I639 Cerebral infarction, unspecified: Secondary | ICD-10-CM | POA: Diagnosis not present

## 2018-12-04 DIAGNOSIS — G2581 Restless legs syndrome: Secondary | ICD-10-CM | POA: Diagnosis not present

## 2018-12-04 MED FILL — PRAVASTATIN SODIUM 10 MG TA: 10 | 90 days supply | Qty: 90 | Fill #1

## 2018-12-13 ENCOUNTER — Ambulatory Visit (INDEPENDENT_AMBULATORY_CARE_PROVIDER_SITE_OTHER): Payer: 59 | Admitting: Cardiovascular Disease

## 2018-12-13 ENCOUNTER — Encounter: Payer: Self-pay | Admitting: Cardiovascular Disease

## 2018-12-13 ENCOUNTER — Other Ambulatory Visit: Payer: Self-pay

## 2018-12-13 VITALS — BP 112/72 | HR 63 | Ht 65.0 in | Wt 163.8 lb

## 2018-12-13 DIAGNOSIS — E785 Hyperlipidemia, unspecified: Secondary | ICD-10-CM

## 2018-12-13 DIAGNOSIS — I10 Essential (primary) hypertension: Secondary | ICD-10-CM | POA: Diagnosis not present

## 2018-12-13 DIAGNOSIS — Z8673 Personal history of transient ischemic attack (TIA), and cerebral infarction without residual deficits: Secondary | ICD-10-CM | POA: Diagnosis not present

## 2018-12-13 MED ORDER — PRAVASTATIN SODIUM 40 MG PO TABS: 40.0000 mg | ORAL_TABLET | Freq: Every day | ORAL | 3 refills | Status: DC

## 2018-12-13 NOTE — Patient Instructions (Signed)
Medication Instructions:  Your physician has recommended you make the following change in your medication:  1) INCREASED Pravastatin 40mg  daily.   If you need a refill on your cardiac medications before your next appointment, please call your pharmacy.   Lab work: None ordered If you have labs (blood work) drawn today and your tests are completely normal, you will receive your results only by: Marland Kitchen MyChart Message (if you have MyChart) OR . A paper copy in the mail If you have any lab test that is abnormal or we need to change your treatment, we will call you to review the results.  Testing/Procedures: None ordered  Follow-Up: At [facility], you and your health needs are our priority.  As part of our continuing mission to provide you with exceptional heart care, we have created designated Provider Care Teams.  These Care Teams include your primary Cardiologist (physician) and Advanced Practice Providers (APPs -  Physician Assistants and Nurse Practitioners) who all work together to provide you with the care you need, when you need it. You will need a follow up appointment as needed   You may see Dr.Arida  or one of the following Advanced Practice Providers on your designated Care Team:   Murray Hodgkins, NP Christell Faith, PA-C . Marrianne Mood, PA-C

## 2018-12-13 NOTE — Progress Notes (Signed)
Cardiology Office Note   Date:  12/13/2018   ID:  Lori Morales, DOB 1960/07/31, MRN [id number]  PCP:  Adin Hector, MD  Cardiologist:   Kathlyn Sacramento, MD   Chief Complaint  Patient presents with  . other    Pt. was at [facility] with a Acute CVA. Meds reviewed by the pt. verbally. Pt. c/o palpitations at times and shortness of breath on exertion.       History of Present Illness: Lori Morales is a 58 y.o. female who was referred by Gayland Curry for evaluation of cardiac source of embolism. She has chronic medical conditions that include essential hypertension, mild hyperlipidemia, migraine and strong family history of vascular disease. She was hospitalized at [facility] in January 2020 with right arm weakness, numbness and gait instability.  MRI showed acute left thalamic stroke.  She was seen by neurology who felt that she had small vessel CVA.  She was started on aspirin and statin.  She was switched from Toprol to losartan due to bradycardia.  Echocardiogram was normal.  Hypercoagulable work-up as an outpatient was negative.  She has been doing well with no recent chest pain, shortness of breath or palpitations.  She has no history of arrhythmia.  She has hyperlipidemia but did not tolerate rosuvastatin even at a smaller dose.  She was thus switched to small dose pravastatin.   Past Medical History:  Diagnosis Date  . Arthritis   . Depression   . GERD (gastroesophageal reflux disease)   . Headache    migraines  . Hx of squamous cell carcinoma 07/11/2016   Right distal dorsum lat forearm   . Hypertension   . IBS (irritable bowel syndrome)   . Palpitations   . PONV (postoperative nausea and vomiting)   . Restless legs syndrome     Past Surgical History:  Procedure Laterality Date  . ABDOMINOPLASTY    . BACK SURGERY     1 laminectomy, 1 fusion  . COLONOSCOPY    . COLONOSCOPY WITH PROPOFOL N/A 01/17/2017   Procedure: COLONOSCOPY WITH PROPOFOL;  Surgeon: Lollie Sails, MD;  Location: [facility];  Service: Endoscopy;  Laterality: N/A;  . WISDOM TOOTH EXTRACTION       Current Outpatient Medications  Medication Sig Dispense Refill  . amitriptyline (ELAVIL) 10 MG tablet Take 10 mg by mouth at bedtime.     Marland Kitchen aspirin EC 81 MG tablet Take 81 mg by mouth daily.     . celecoxib (CELEBREX) 200 MG capsule Take 200 mg by mouth every evening.     Marland Kitchen losartan (COZAAR) 25 MG tablet Take 1 tablet (25 mg total) by mouth daily. 30 tablet 1  . MIMVEY LO 0.5-0.1 MG per tablet Take 1 tablet by mouth at bedtime.   4  . pantoprazole (PROTONIX) 40 MG tablet Take 40 mg by mouth at bedtime.     . pramipexole (MIRAPEX) 0.25 MG tablet Take 1 to 1 and half tablet daily.    . pravastatin (PRAVACHOL) 10 MG tablet Take 10 mg by mouth daily.     Marland Kitchen zolpidem (AMBIEN CR) 6.25 MG CR tablet Take 6.25 mg by mouth at bedtime as needed for sleep.      No current facility-administered medications for this visit.     Allergies:   Penicillins, Pristiq [desvenlafaxine], and Rosuvastatin    Social History:  The patient  reports that she has never smoked. She has never used smokeless tobacco. She reports that  she does not drink alcohol or use drugs.   Family History:  The patient's family history includes Breast cancer (age of onset: 50) in her maternal aunt; CAD in her mother; Heart attack in her father; Stroke in her mother.    ROS:  Please see the history of present illness.   Otherwise, review of systems are positive for none.   All other systems are reviewed and negative.    PHYSICAL EXAM: VS:  BP 112/72 (BP Location: Right Arm, Patient Position: Sitting, Cuff Size: Normal)   Pulse 63   Ht 5\' 5"  (1.651 m)   Wt 163 lb 12 oz (74.3 kg)   LMP 02/13/2012   BMI 27.25 kg/m  , BMI Body mass index is 27.25 kg/m. GEN: Well nourished, well developed, in no acute distress  HEENT: normal  Neck: no JVD, carotid bruits, or masses Cardiac: RRR; no murmurs, rubs, or gallops,no edema   Respiratory:  clear to auscultation bilaterally, normal work of breathing GI: soft, nontender, nondistended, + BS MS: no deformity or atrophy  Skin: warm and dry, no rash Neuro:  Strength and sensation are intact Psych: euthymic mood, full affect   EKG:  EKG is ordered today. The ekg ordered today demonstrates normal sinus rhythm with no significant ST or T wave changes.   Recent Labs: 06/15/2018: ALT 23; Hemoglobin 14.2; Magnesium 2.1; Platelets 172 06/16/2018: BUN 17; Creatinine, Ser 0.49; Potassium 3.8; Sodium 138; TSH 5.931    Lipid Panel    Component Value Date/Time   CHOL 211 (H) 06/15/2018 1953   CHOL 169 09/12/2011 0820   TRIG 58 06/15/2018 1953   TRIG 77 09/12/2011 0820   HDL 65 06/15/2018 1953   HDL 60 09/12/2011 0820   CHOLHDL 3.2 06/15/2018 1953   VLDL 12 06/15/2018 1953   VLDL 15 09/12/2011 0820   LDLCALC 134 (H) 06/15/2018 1953   LDLCALC 94 09/12/2011 0820      Wt Readings from Last 3 Encounters:  12/13/18 163 lb 12 oz (74.3 kg)  06/15/18 181 lb (82.1 kg)  10/16/17 164 lb (74.4 kg)       PAD Screen 12/13/2018  Previous PAD dx? No  Previous surgical procedure? No  Pain with walking? No  Feet/toe relief with dangling? No  Painful, non-healing ulcers? No  Extremities discolored? No      ASSESSMENT AND PLAN:  1.  Status post thalamic stroke: I discussed with Dr. Manuella Ghazi; her neurologist regarding the suspicion for embolic stroke.  It seems that the suspicion is overall low and thus no need for a loop recorder.  However, we do recommend a two-week Zio patch monitor to evaluate for possible underlying paroxysmal atrial fibrillation.  2.  Hyperlipidemia: LDL was 134 but improved to 94 with treatment.  Given her vascular disease, I think we need to be more aggressive with her lipid management.  Thus, I increased pravastatin to 40 mg once daily.  If LDL remains above 70, can consider Zetia or Bempedoic acid  3.  Essential hypertension: Blood pressure is  controlled with losartan.     Disposition:   FU with me as needed  Signed,  Kathlyn Sacramento, MD  12/13/2018 2:33 PM    [place]

## 2018-12-14 ENCOUNTER — Telehealth: Payer: Self-pay | Admitting: Cardiovascular Disease

## 2018-12-14 DIAGNOSIS — Z8673 Personal history of transient ischemic attack (TIA), and cerebral infarction without residual deficits: Secondary | ICD-10-CM

## 2018-12-14 NOTE — Telephone Encounter (Signed)
Lori Hampshire, MD  P Cv Div [facility] Triage        Please place a 2-week Zio patch monitor. I already informed the patient of this.

## 2018-12-14 NOTE — Telephone Encounter (Signed)
Order placed for a ZIO XT monitor. Will forward to scheduling to call patient to arrange a time for her to come in for placement.   Dx- Stroke.

## 2018-12-17 NOTE — Telephone Encounter (Signed)
Scheduled

## 2018-12-17 NOTE — Telephone Encounter (Signed)
lmov to call office for schedule

## 2018-12-18 MED FILL — ZOLPIDEM TART ER 6.25 MG TA: 6.25 | 30 days supply | Qty: 30 | Fill #1

## 2018-12-19 ENCOUNTER — Ambulatory Visit (INDEPENDENT_AMBULATORY_CARE_PROVIDER_SITE_OTHER): Payer: 59

## 2018-12-19 ENCOUNTER — Other Ambulatory Visit: Payer: Self-pay

## 2018-12-19 DIAGNOSIS — Z8673 Personal history of transient ischemic attack (TIA), and cerebral infarction without residual deficits: Secondary | ICD-10-CM | POA: Diagnosis not present

## 2018-12-26 MED FILL — LOSARTAN POTASSIUM 25 MG TA: 25 | 30 days supply | Qty: 30 | Fill #2

## 2018-12-31 DIAGNOSIS — H5213 Myopia, bilateral: Secondary | ICD-10-CM | POA: Diagnosis not present

## 2018-12-31 MED FILL — PANTOPRAZOLE SOD DR 40 MG T: 40 | 30 days supply | Qty: 30 | Fill #0

## 2019-01-01 ENCOUNTER — Telehealth: Payer: Self-pay | Admitting: Cardiovascular Disease

## 2019-01-01 NOTE — Telephone Encounter (Signed)
Please call patient to discuss monitor. States she is supposed to remove it tomorrow and does not have the box for it.

## 2019-01-01 NOTE — Telephone Encounter (Signed)
Adv the pt that we were able to locate an extra box for her return the zio-monitor. Adv the pt that I will leave the box at the front desk to be picked up. Pt verbalized understanding and voiced appreciation.

## 2019-01-08 DIAGNOSIS — I639 Cerebral infarction, unspecified: Secondary | ICD-10-CM | POA: Diagnosis not present

## 2019-01-24 MED FILL — PANTOPRAZOLE SOD DR 40 MG T: 40 | 30 days supply | Qty: 30 | Fill #1

## 2019-01-25 MED FILL — LOSARTAN POTASSIUM 25 MG TA: 25 | 30 days supply | Qty: 30 | Fill #3

## 2019-02-12 DIAGNOSIS — E7849 Other hyperlipidemia: Secondary | ICD-10-CM | POA: Diagnosis not present

## 2019-02-12 DIAGNOSIS — R739 Hyperglycemia, unspecified: Secondary | ICD-10-CM | POA: Diagnosis not present

## 2019-02-12 DIAGNOSIS — E611 Iron deficiency: Secondary | ICD-10-CM | POA: Diagnosis not present

## 2019-02-12 MED FILL — CELECOXIB 200 MG CAP: 200 | 90 days supply | Qty: 90 | Fill #1

## 2019-02-18 MED FILL — ZOLPIDEM TART ER 6.25 MG TA: 6.25 | 30 days supply | Qty: 30 | Fill #2

## 2019-02-20 DIAGNOSIS — M519 Unspecified thoracic, thoracolumbar and lumbosacral intervertebral disc disorder: Secondary | ICD-10-CM | POA: Diagnosis not present

## 2019-02-20 DIAGNOSIS — I10 Essential (primary) hypertension: Secondary | ICD-10-CM | POA: Diagnosis not present

## 2019-02-20 DIAGNOSIS — E7849 Other hyperlipidemia: Secondary | ICD-10-CM | POA: Diagnosis not present

## 2019-02-20 DIAGNOSIS — R739 Hyperglycemia, unspecified: Secondary | ICD-10-CM | POA: Diagnosis not present

## 2019-02-21 ENCOUNTER — Telehealth: Payer: Self-pay | Admitting: *Deleted

## 2019-02-21 DIAGNOSIS — R0600 Dyspnea, unspecified: Secondary | ICD-10-CM

## 2019-02-21 DIAGNOSIS — I10 Essential (primary) hypertension: Secondary | ICD-10-CM

## 2019-02-21 DIAGNOSIS — Z8673 Personal history of transient ischemic attack (TIA), and cerebral infarction without residual deficits: Secondary | ICD-10-CM

## 2019-02-21 DIAGNOSIS — R0602 Shortness of breath: Secondary | ICD-10-CM

## 2019-02-21 DIAGNOSIS — E785 Hyperlipidemia, unspecified: Secondary | ICD-10-CM

## 2019-02-21 DIAGNOSIS — I739 Peripheral vascular disease, unspecified: Secondary | ICD-10-CM

## 2019-02-21 DIAGNOSIS — R0609 Other forms of dyspnea: Secondary | ICD-10-CM

## 2019-02-21 NOTE — Telephone Encounter (Signed)
Coronary CT scheduler Mack Guise requested to place Coronary CT orders in on this pt as ordered by her PCP Dr. Ramonita Lab with [facility].  Dr. Caryl Comes ordered this for the following diagnosis provided:  History stroke, HLP, DOE, HTN, and PAD.  Order placed and Dr. Meda Coffee to read only.  Results should be sent back to the original ordering Provider, Dr. Ramonita Lab.  Pt will have this done at our [facility] location.  Mack Guise CT scheduler to follow-up with the pt on scheduling this, having pre-certed, and having the pts PCP provider her with Coronary CT instructions, per our protocol.

## 2019-02-23 MED FILL — PANTOPRAZOLE SOD DR 40 MG T: 40 | 30 days supply | Qty: 30 | Fill #2

## 2019-02-26 ENCOUNTER — Telehealth (HOSPITAL_COMMUNITY): Payer: Self-pay | Admitting: Emergency Medicine

## 2019-02-26 NOTE — Telephone Encounter (Signed)
Left message on voicemail with name and callback number Casimer Russett RN Navigator Cardiac Imaging [facility] [phone] Office [phone] Cell  

## 2019-02-27 ENCOUNTER — Ambulatory Visit
Admission: RE | Admit: 2019-02-27 | Discharge: 2019-02-27 | Disposition: A | Discharge status: Home / Self Care | Payer: 59 | Source: Ambulatory Visit

## 2019-02-27 ENCOUNTER — Other Ambulatory Visit: Payer: Self-pay

## 2019-02-27 ENCOUNTER — Ambulatory Visit
Admission: RE | Admit: 2019-02-27 | Discharge: 2019-02-27 | Disposition: A | Discharge status: Home / Self Care | Payer: 59 | Source: Ambulatory Visit | Attending: Cardiology | Admitting: Cardiology

## 2019-02-27 DIAGNOSIS — I10 Essential (primary) hypertension: Secondary | ICD-10-CM

## 2019-02-27 DIAGNOSIS — Z8673 Personal history of transient ischemic attack (TIA), and cerebral infarction without residual deficits: Secondary | ICD-10-CM | POA: Diagnosis not present

## 2019-02-27 DIAGNOSIS — R0600 Dyspnea, unspecified: Secondary | ICD-10-CM

## 2019-02-27 DIAGNOSIS — R0602 Shortness of breath: Secondary | ICD-10-CM

## 2019-02-27 DIAGNOSIS — E785 Hyperlipidemia, unspecified: Secondary | ICD-10-CM | POA: Diagnosis not present

## 2019-02-27 DIAGNOSIS — I739 Peripheral vascular disease, unspecified: Secondary | ICD-10-CM | POA: Diagnosis not present

## 2019-02-27 DIAGNOSIS — R0609 Other forms of dyspnea: Secondary | ICD-10-CM

## 2019-02-27 DIAGNOSIS — I2510 Atherosclerotic heart disease of native coronary artery without angina pectoris: Secondary | ICD-10-CM | POA: Diagnosis not present

## 2019-02-27 MED ORDER — NITROGLYCERIN 0.4 MG SL SUBL
0.8000 mg | SUBLINGUAL_TABLET | Freq: Once | SUBLINGUAL | Status: AC
  Administered 2019-02-27: 0.8 mg via SUBLINGUAL

## 2019-02-27 MED ORDER — IOHEXOL 350 MG/ML SOLN
75.0000 mL | Freq: Once | INTRAVENOUS | Status: AC | PRN
  Administered 2019-02-27: 10:00:00 75 mL via INTRAVENOUS

## 2019-02-27 MED ORDER — METOPROLOL TARTRATE 5 MG/5ML IV SOLN
5.0000 mg | INTRAVENOUS | Status: DC | PRN
  Administered 2019-02-27 (×2): 5 mg via INTRAVENOUS

## 2019-02-27 MED FILL — LOSARTAN POTASSIUM 25 MG TA: 25 | 30 days supply | Qty: 30 | Fill #4

## 2019-02-27 NOTE — Progress Notes (Signed)
Patient tolerated CT without incident. Drank coffee. Ambulated to exit steady gait.

## 2019-02-28 ENCOUNTER — Other Ambulatory Visit: Payer: Self-pay | Admitting: Emergency Medicine

## 2019-02-28 ENCOUNTER — Encounter: Payer: Self-pay | Admitting: Cardiology

## 2019-02-28 DIAGNOSIS — R0602 Shortness of breath: Secondary | ICD-10-CM

## 2019-02-28 DIAGNOSIS — I2510 Atherosclerotic heart disease of native coronary artery without angina pectoris: Secondary | ICD-10-CM | POA: Diagnosis not present

## 2019-02-28 DIAGNOSIS — Q211 Atrial septal defect: Secondary | ICD-10-CM | POA: Insufficient documentation

## 2019-02-28 DIAGNOSIS — Q2112 Patent foramen ovale: Secondary | ICD-10-CM | POA: Insufficient documentation

## 2019-03-04 MED FILL — PRAVASTATIN SODIUM 10 MG TA: 10 | 90 days supply | Qty: 90 | Fill #2

## 2019-03-06 ENCOUNTER — Other Ambulatory Visit: Payer: Self-pay | Admitting: Obstetrics and Gynecology

## 2019-03-06 DIAGNOSIS — Z1231 Encounter for screening mammogram for malignant neoplasm of breast: Secondary | ICD-10-CM

## 2019-03-25 MED FILL — PANTOPRAZOLE SOD DR 40 MG T: 40 | 30 days supply | Qty: 30 | Fill #3

## 2019-03-26 MED FILL — LOSARTAN POTASSIUM 25 MG TA: 25 | 30 days supply | Qty: 30 | Fill #5

## 2019-04-04 MED FILL — ZOLPIDEM TART ER 6.25 MG TA: 6.25 | 30 days supply | Qty: 30 | Fill #3

## 2019-04-17 DIAGNOSIS — D229 Melanocytic nevi, unspecified: Secondary | ICD-10-CM | POA: Diagnosis not present

## 2019-04-17 DIAGNOSIS — L814 Other melanin hyperpigmentation: Secondary | ICD-10-CM | POA: Diagnosis not present

## 2019-04-17 DIAGNOSIS — D225 Melanocytic nevi of trunk: Secondary | ICD-10-CM | POA: Diagnosis not present

## 2019-04-17 DIAGNOSIS — L570 Actinic keratosis: Secondary | ICD-10-CM | POA: Diagnosis not present

## 2019-04-17 DIAGNOSIS — Z1283 Encounter for screening for malignant neoplasm of skin: Secondary | ICD-10-CM | POA: Diagnosis not present

## 2019-04-17 DIAGNOSIS — L578 Other skin changes due to chronic exposure to nonionizing radiation: Secondary | ICD-10-CM | POA: Diagnosis not present

## 2019-04-17 DIAGNOSIS — Z85828 Personal history of other malignant neoplasm of skin: Secondary | ICD-10-CM | POA: Diagnosis not present

## 2019-04-17 DIAGNOSIS — L821 Other seborrheic keratosis: Secondary | ICD-10-CM | POA: Diagnosis not present

## 2019-04-17 DIAGNOSIS — D2230 Melanocytic nevi of unspecified part of face: Secondary | ICD-10-CM | POA: Diagnosis not present

## 2019-04-22 ENCOUNTER — Ambulatory Visit
Admission: RE | Admit: 2019-04-22 | Discharge: 2019-04-22 | Disposition: A | Discharge status: Home / Self Care | Payer: 59 | Source: Ambulatory Visit | Attending: Obstetrics and Gynecology | Admitting: Obstetrics and Gynecology

## 2019-04-22 DIAGNOSIS — Z1231 Encounter for screening mammogram for malignant neoplasm of breast: Secondary | ICD-10-CM | POA: Insufficient documentation

## 2019-04-24 MED FILL — PANTOPRAZOLE SOD DR 40 MG T: 40 | 30 days supply | Qty: 30 | Fill #4

## 2019-04-26 MED FILL — LOSARTAN POTASSIUM 25 MG TA: 25 | 30 days supply | Qty: 30 | Fill #6

## 2019-05-01 MED FILL — ZOLPIDEM TART ER 6.25 MG TA: 6.25 | 30 days supply | Qty: 30 | Fill #4

## 2019-05-08 DIAGNOSIS — M7582 Other shoulder lesions, left shoulder: Secondary | ICD-10-CM | POA: Diagnosis not present

## 2019-05-08 DIAGNOSIS — M542 Cervicalgia: Secondary | ICD-10-CM | POA: Diagnosis not present

## 2019-05-27 MED FILL — PANTOPRAZOLE SOD DR 40 MG T: 40 | 30 days supply | Qty: 30 | Fill #5

## 2019-05-27 MED FILL — LOSARTAN POTASSIUM 25 MG TA: 25 | 30 days supply | Qty: 30 | Fill #7

## 2019-06-05 MED FILL — PRAVASTATIN NA 40 MG TAB: 40 | 90 days supply | Qty: 90 | Fill #0

## 2019-06-23 MED FILL — PANTOPRAZOLE SOD DR 40 MG T: 40 | 30 days supply | Qty: 30 | Fill #6

## 2019-06-24 MED FILL — LOSARTAN POTASSIUM 25 MG TA: 25 | 30 days supply | Qty: 30 | Fill #8

## 2019-07-11 ENCOUNTER — Other Ambulatory Visit: Payer: Self-pay | Admitting: Physician Assistant

## 2019-07-11 DIAGNOSIS — M5412 Radiculopathy, cervical region: Secondary | ICD-10-CM | POA: Diagnosis not present

## 2019-07-11 DIAGNOSIS — Z8673 Personal history of transient ischemic attack (TIA), and cerebral infarction without residual deficits: Secondary | ICD-10-CM | POA: Diagnosis not present

## 2019-07-11 DIAGNOSIS — M542 Cervicalgia: Secondary | ICD-10-CM | POA: Diagnosis not present

## 2019-07-23 MED FILL — PANTOPRAZOLE SOD DR 40 MG T: 40 | 90 days supply | Qty: 90 | Fill #0

## 2019-07-24 ENCOUNTER — Other Ambulatory Visit: Payer: Self-pay

## 2019-07-24 ENCOUNTER — Ambulatory Visit
Admission: RE | Admit: 2019-07-24 | Discharge: 2019-07-24 | Disposition: A | Discharge status: Home / Self Care | Payer: 59 | Source: Ambulatory Visit | Attending: Physician Assistant | Admitting: Physician Assistant

## 2019-07-24 DIAGNOSIS — M542 Cervicalgia: Secondary | ICD-10-CM | POA: Diagnosis not present

## 2019-07-24 DIAGNOSIS — M5412 Radiculopathy, cervical region: Secondary | ICD-10-CM | POA: Insufficient documentation

## 2019-07-25 MED FILL — LOSARTAN POTASSIUM 25 MG TA: 25 | 30 days supply | Qty: 30 | Fill #0

## 2019-07-29 ENCOUNTER — Other Ambulatory Visit: Payer: Self-pay | Admitting: Internal Medicine

## 2019-07-29 ENCOUNTER — Other Ambulatory Visit: Payer: Self-pay | Admitting: Physician Assistant

## 2019-07-29 DIAGNOSIS — M5412 Radiculopathy, cervical region: Secondary | ICD-10-CM

## 2019-07-29 DIAGNOSIS — M5090 Cervical disc disorder, unspecified, unspecified cervical region: Secondary | ICD-10-CM

## 2019-07-31 ENCOUNTER — Ambulatory Visit
Admission: RE | Admit: 2019-07-31 | Discharge: 2019-07-31 | Disposition: A | Discharge status: Home / Self Care | Payer: 59 | Source: Ambulatory Visit | Attending: Internal Medicine | Admitting: Internal Medicine

## 2019-07-31 ENCOUNTER — Other Ambulatory Visit: Payer: Self-pay | Admitting: Internal Medicine

## 2019-07-31 ENCOUNTER — Other Ambulatory Visit: Payer: Self-pay

## 2019-07-31 DIAGNOSIS — M5090 Cervical disc disorder, unspecified, unspecified cervical region: Secondary | ICD-10-CM

## 2019-07-31 DIAGNOSIS — M47812 Spondylosis without myelopathy or radiculopathy, cervical region: Secondary | ICD-10-CM | POA: Diagnosis not present

## 2019-07-31 MED ORDER — TRIAMCINOLONE ACETONIDE 40 MG/ML IJ SUSP (RADIOLOGY)
60.0000 mg | Freq: Once | INTRAMUSCULAR | Status: AC
  Administered 2019-07-31: 60 mg via EPIDURAL

## 2019-07-31 MED ORDER — IOPAMIDOL (ISOVUE-M 300) INJECTION 61%
1.0000 mL | Freq: Once | INTRAMUSCULAR | Status: AC | PRN
  Administered 2019-07-31: 1 mL via EPIDURAL

## 2019-07-31 NOTE — Discharge Instructions (Signed)

## 2019-08-15 DIAGNOSIS — M519 Unspecified thoracic, thoracolumbar and lumbosacral intervertebral disc disorder: Secondary | ICD-10-CM | POA: Diagnosis not present

## 2019-08-15 DIAGNOSIS — E7849 Other hyperlipidemia: Secondary | ICD-10-CM | POA: Diagnosis not present

## 2019-08-15 DIAGNOSIS — R739 Hyperglycemia, unspecified: Secondary | ICD-10-CM | POA: Diagnosis not present

## 2019-08-15 DIAGNOSIS — I10 Essential (primary) hypertension: Secondary | ICD-10-CM | POA: Diagnosis not present

## 2019-08-22 DIAGNOSIS — Z0000 Encounter for general adult medical examination without abnormal findings: Secondary | ICD-10-CM | POA: Diagnosis not present

## 2019-08-22 DIAGNOSIS — M519 Unspecified thoracic, thoracolumbar and lumbosacral intervertebral disc disorder: Secondary | ICD-10-CM | POA: Diagnosis not present

## 2019-08-22 DIAGNOSIS — I10 Essential (primary) hypertension: Secondary | ICD-10-CM | POA: Diagnosis not present

## 2019-08-22 DIAGNOSIS — I2510 Atherosclerotic heart disease of native coronary artery without angina pectoris: Secondary | ICD-10-CM | POA: Diagnosis not present

## 2019-08-28 MED FILL — PRAVASTATIN NA 40 MG TAB: 40 | 90 days supply | Qty: 90 | Fill #1

## 2019-08-29 ENCOUNTER — Ambulatory Visit: Payer: 59 | Attending: Internal Medicine

## 2019-08-29 ENCOUNTER — Other Ambulatory Visit: Payer: Self-pay

## 2019-08-29 DIAGNOSIS — R278 Other lack of coordination: Secondary | ICD-10-CM | POA: Diagnosis not present

## 2019-08-29 DIAGNOSIS — M6281 Muscle weakness (generalized): Secondary | ICD-10-CM | POA: Diagnosis not present

## 2019-08-29 DIAGNOSIS — M5440 Lumbago with sciatica, unspecified side: Secondary | ICD-10-CM | POA: Insufficient documentation

## 2019-08-29 NOTE — Patient Instructions (Signed)
Access Code: [email or web address] URL: [email or web address] Date: 08/29/2019 Prepared by: Janna Arch  Exercises Supine Posterior Pelvic Tilt - 1 x daily - 7 x weekly - 10 reps - 2 sets - 5 hold Seated Posterior Pelvic Tilt - 1 x daily - 7 x weekly - 10 reps - 2 sets - 5 hold Prone Press Up on Elbows - 1 x daily - 7 x weekly - 10 reps - 2 sets - 5 hold Standing Hip Flexor Stretch - 1 x daily - 7 x weekly - 2 sets - 2 reps - 30 hold  Patient Education Office Posture

## 2019-08-29 NOTE — Therapy (Signed)
[place] MAIN [facility] SERVICES [address] [place], [place], [address] Phone: [phone]   Fax:  [phone]  Physical Therapy Evaluation  Patient Details  Name: Lori Morales MRN: [id number] Date of Birth: 11/18/1960 Referring Provider (PT): Tama High III   Encounter Date: 08/29/2019  PT End of Session - 08/29/19 1237    Visit Number  1    Number of Visits  8    Date for PT Re-Evaluation  09/26/19    Authorization Type  1/10 eval 08/29/19    PT Start Time  1016    PT Stop Time  1059    PT Time Calculation (min)  43 min    Activity Tolerance  No increased pain    Behavior During Therapy  [facility] for tasks assessed/performed       Past Medical History:  Diagnosis Date  . Arthritis   . CAD (coronary artery disease), native coronary artery    mild non calcified plaque in mid LAD and prox LCx on coronary CTA 01/2019  . Depression   . GERD (gastroesophageal reflux disease)   . Headache    migraines  . Hx of squamous cell carcinoma 07/11/2016   Right distal dorsum lat forearm   . Hypertension   . IBS (irritable bowel syndrome)   . Palpitations   . PFO (patent foramen ovale)    small by cardiac CTA 01/2019  . PONV (postoperative nausea and vomiting)   . Restless legs syndrome     Past Surgical History:  Procedure Laterality Date  . ABDOMINOPLASTY    . BACK SURGERY     1 laminectomy, 1 fusion  . COLONOSCOPY    . COLONOSCOPY WITH PROPOFOL N/A 01/17/2017   Procedure: COLONOSCOPY WITH PROPOFOL;  Surgeon: Lollie Sails, MD;  Location: [facility] ENDOSCOPY;  Service: Endoscopy;  Laterality: N/A;  . WISDOM TOOTH EXTRACTION      There were no vitals filed for this visit.   Subjective Assessment - 08/29/19 1022    Subjective  Patient is a pleasant 59 year old female who presents with LBP w sciatica of L side.    Pertinent History  Patient is a pleasant 59 year old female who presents with LBP w sciatica of L side. PMH of hyperglycemia,  elevated BP, arthritis, HLD, restless legs, chronic insomnia, prior vertiginous migraine, thalamic CVA, anxiety, CAD, depression, GERD, hx of cancer, IBS, lumbar disc disease (prior back surgery with fusion x2 L4-5, S1), and cervical disc disease. Her mother recently passed away with hospice. Has additionally worsening neck issues with MRI revealing disc bulge  with unsuccessful steroid taper. Per physician note pain is on left side of neck and down left shoulder. Additionally has discomfort in SI joint (left). Severe increase in low back radiating pain began a week prior to PT eval (08/18/19) with acute onset of weakness. 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    Limitations  Sitting;Lifting;Standing;Walking;House hold activities    How long can you sit comfortably?  ease off after a whie sitting but immediately painful    How long can you stand comfortably?  immediately painful and then eases off    How long can you walk comfortably?  a couple of minutes    Patient Stated Goals  pain to go away    Currently in Pain?  Yes    Pain Score  6     Pain  Location  Back    Pain Orientation  Left;Lower    Pain Descriptors / Indicators  Aching;Throbbing;Radiating    Pain Type  Acute pain    Pain Radiating Towards  LLE; initially on lateral aspect of leg, this morning began coming down back of leg.    Pain Onset  1 to 4 weeks ago    Pain Frequency  Intermittent    Aggravating Factors   worse in the morning, initial position change of sitting or standing. prolonged sitting. bedtime is worse than morning    Pain Relieving Factors  heat helps    Effect of Pain on Daily Activities  weakness of LLE now limits stair negotiation, limits social/working out         [facility] PT Assessment - 08/29/19 0001      Assessment   Medical Diagnosis  LBP w sciatica     Referring Provider (PT)  Tama High III    Onset Date/Surgical Date  --   08/18/19   Hand  Dominance  Right    Prior Therapy  s/p fusions       Precautions   Precautions  Other (comment)   has fused lumbar region      Restrictions   Weight Bearing Restrictions  No      Balance Screen   Has the patient fallen in the past 6 months  No    Has the patient had a decrease in activity level because of a fear of falling?   Yes    Is the patient reluctant to leave their home because of a fear of falling?   No      Home Film/video editor residence    Living Arrangements  Spouse/significant other    Available Help at Discharge  Family    Type of [place] to enter    Entrance Stairs-Number of Steps  2      Prior Function   Level of Independence  Independent    Vocation  Full time employment    Vocation Requirements  prolonged sitting    Leisure  works out 5x/week. hiking, bird watching       Cognition   Overall Cognitive Status  Within Functional Limits for tasks assessed         Eval back LBP w sciatica    Patient is a pleasant 59 year old female who presents with LBP w sciatica of L side. PMH of hyperglycemia, elevated BP, arthritis, HLD, restless legs, chronic insomnia, prior vertiginous migraine, thalamic CVA, anxiety, CAD, depression, GERD, hx of cancer, IBS, lumbar disc disease (prior back surgery with fusion x2 L4-5, S1), and cervical disc disease. Her mother recently passed away with hospice. Has additionally worsening neck issues with MRI revealing disc bulge  with unsuccessful steroid taper. Per physician note pain is on left side of neck and down left shoulder. Additionally has discomfort in SI joint (left). Severe increase in low back radiating pain began a week prior to PT eval (08/18/19) with acute onset of weakness. 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    Exercises 5 days a week   PAIN: Back pain Worst Pain: 8/10  Current pain 6/10 Least pain:  2/10   Walking and sitting make it worse, bothers her the most at bedtime   Weaker on LLE especially going up the stairs  POSTURE: Seated: anterior pelvic tilt, flattening of upper lumbar spine, slight forward head rounded shoulders posturing  Standing: slight flattening of upper lumbar spine, anterior pelvic shift, weight shift onto RLE.   PROM/AROM: Trunk Flexion WFL *, painful when returning to upright position  Trunk Extension 13 *pain at end range ; repeated motion pt reports no change   Trunk R SB Limited 25% *  Trunk L SB Limited 25 % *  Trunk R rotation Limited 50% *  Trunk L rotation Limited 50% *  *pain   Soft tissue length: Limited piriformis length LLE Limited hip flexors/iliposoas bilaterally Lumbar paraspinals and quadratus musculature: limited muscle tissue length with L>R.    STRENGTH:  Graded on a 0-5 scale Muscle Group Left Right  Hip Flex 3+/5 4/5  Hip Abd 3/5 4/5  Hip Add 2+/5 4/5  Hip Ext 2+/5 4-/5  Hip IR/ER 3/5 4-/5  Knee Flex 3+/5* 4/5  Knee Ext 3+/5* 4/5  Ankle DF 4/5 4/5  Ankle PF 4/5 4/5  * pain   SPECIAL TESTS:  Back: SLR+ LLE  Slump test: deferred due to pain Pelvis - Distraction  -compression  +sacral thrust test Hip: -Scour bilaterally -FABER bilaterally -FAIR bilaterally Piriformis tender and +  FUNCTIONAL MOBILITY: Able to transition sit to stand, noticeable increase in pain, shift onto RLE Sit <>supine: functional but slow with pain increase, good use of log rolling Supine <>prone slow and painful but able to be performed.   GAIT: Antalgic gait pattern with decreased hip extension and decreased weight shift/acceptance onto LLE  OUTCOME MEASURES: TEST Outcome Interpretation  None performed due to pain     FOTO 44/100 Discharge score 56  Risk Adjusted Foto 45/100   ABC 94.4%             Access Code: [email or web address] URL: [email or web address] Date: 08/29/2019 Prepared by: Janna Arch  Exercises Supine Posterior Pelvic Tilt - 1 x daily - 7 x weekly - 10 reps - 2 sets - 5 hold Seated Posterior Pelvic Tilt - 1 x daily - 7 x weekly - 10 reps - 2 sets - 5 hold Prone Press Up on Elbows - 1 x daily - 7 x weekly - 10 reps - 2 sets - 5 hold Standing Hip Flexor Stretch - 1 x daily - 7 x weekly - 2 sets - 2 reps - 30 hold  Patient Education Office Posture  Radiculopathy due to disc herniation due to the following: sudden onset, sitting (flexion) is worse, focal muscle weakness, dural tension,    Patient is a pleasant 59 year old female who presents to physical therapy for low back pain with radiating sciatica on L side.  Noticeable weakness of LLE against muscle testing with additional postural compensatory mechanisms off limb in weightbearing. Patient does have s5 and lumbar fusions limiting ability to mobilize spinal region. Thoracic spine is hypomobile and surrounding musculature of lumbar and sacral region is limited with high muscle guarding.  Patient would benefit from skilled physical therapy to decrease pain and improve mobility to return patient to PLOF.    Objective measurements completed on examination: See above findings.              PT Education - 08/29/19 1234    Education Details  goals, POC, HEP    Person(s) Educated  Patient    Methods  Explanation;Demonstration;Tactile cues;Verbal cues;Handout    Comprehension  Verbalized understanding;Returned demonstration;Verbal cues required;Tactile cues required       PT Short  Term Goals - 08/29/19 1242      PT SHORT TERM GOAL #1   Title  Patient will be independent in home exercise program to improve strength/mobility for better functional independence with ADLs.    Baseline  4/1: HEP given    Time  2    Period  Weeks    Status  New    Target Date  09/26/19        PT Long Term Goals - 08/29/19 1243      PT LONG TERM GOAL #1   Title  Patient will report a worst pain of 3/10 on VAS in low  back  to improve tolerance with ADLs and reduced symptoms with activities.    Baseline  4/1: 8/10    Time  4    Period  Weeks    Status  New    Target Date  09/26/19      PT LONG TERM GOAL #2   Title  Patient will increase FOTO score to equal to or greater than  56/100   to demonstrate statistically significant improvement in mobility and quality of life.    Baseline  4/1: 44/100    Time  4    Period  Weeks    Status  New    Target Date  09/26/19      PT LONG TERM GOAL #3   Title  Patient will increase BLE gross strength to 4+/5 as to improve functional strength for independent gait, increased standing tolerance and increased ADL ability.    Baseline  4/1:see note    Time  4    Period  Weeks    Status  New    Target Date  09/26/19      PT LONG TERM GOAL #4   Title  Patient will tolerate sitting unsupported for 15+ minutes to demonstrate improved back extensor strength and improved sitting tolerance.    Baseline  4/1: painful to sit at work    Time  4    Period  Weeks    Status  New    Target Date  09/26/19             Plan - 08/29/19 1238    Clinical Impression Statement  Patient is a pleasant 59 year old female who presents to physical therapy for low back pain with radiating sciatica on L side.  Noticeable weakness of LLE against muscle testing with additional postural compensatory mechanisms off limb in weightbearing. Patient does have s5 and lumbar fusions limiting ability to mobilize spinal region. Thoracic spine is hypomobile and surrounding musculature of lumbar and sacral region is limited with high muscle guarding.  Patient would benefit from skilled physical therapy to decrease pain and improve mobility to return patient to PLOF.    Personal Factors and Comorbidities  Age;Comorbidity 3+;Past/Current Experience;Profession;Social Background    Comorbidities  hyperglycemia, elevated BP, arthritis, HLD, restless legs, chronic insomnia, prior vertiginous migraine,  thalamic CVA, anxiety, CAD, depression, GERD, hx of cancer, IBS, lumbar disc disease (prior back surgery with fusion x2 L4-5, S1), and cervical disc disease.    Examination-Activity Limitations  Bathing;Bed Mobility;Bend;Caring for [employer];Locomotion Level;Lift;Stand;Toileting;Transfers    Examination-Participation Restrictions  Cleaning;Community Activity;Driving;Interpersonal Relationship;Laundry;Volunteer;Meal Prep;Yard Work;Other    Stability/Clinical Decision Making  Evolving/Moderate complexity    Clinical Decision Making  Moderate    Rehab Potential  Fair    PT Frequency  2x / week    PT Duration  4 weeks    PT Treatment/Interventions  ADLs/Self Care Home Management;Biofeedback;Cryotherapy;Electrical Stimulation;Iontophoresis 4mg /ml Dexamethasone;Moist Heat;Traction;Ultrasound;DME Instruction;Gait training;Stair training;Functional mobility training;Therapeutic activities;Patient/family education;Neuromuscular re-education;Balance training;Therapeutic exercise;Manual techniques;Energy conservation;Dry needling;Passive range of motion;Taping;Vestibular;Vasopneumatic Device    PT Next Visit Plan  review HEP, distraction, STM, repeated motions    PT Home Exercise Plan  see above    Consulted and Agree with Plan of Care  Patient       Patient will benefit from skilled therapeutic intervention in order to improve the following deficits and impairments:  Abnormal gait, Decreased activity tolerance, Decreased coordination, Decreased mobility, Decreased range of motion, Difficulty walking, Decreased strength, Hypomobility, Impaired flexibility, Impaired perceived functional ability, Increased muscle spasms, Postural dysfunction, Improper body mechanics, Pain  Visit Diagnosis: Muscle weakness (generalized)  Other lack of coordination     Problem List Patient Active Problem List   Diagnosis Date Noted  . PFO (patent foramen ovale)   . CAD  (coronary artery disease), native coronary artery   . Acute CVA (cerebrovascular accident) ([place]) 06/15/2018  . Depression 10/16/2017  . Migraine 10/16/2017  . Hyperglycemia, unspecified 07/10/2017  . Hyperglycemia 07/10/2017  . H/O dizziness 08/02/2016  . Cervical disc disorder with radiculopathy of cervical region 04/12/2016  . Nonallopathic lesion of thoracic region 04/12/2016  . Nonallopathic lesion of rib cage 04/12/2016  . Facet arthritis of cervical region 03/21/2016  . Hyperlipidemia 01/26/2015  . Labral tear of shoulder, right, subsequent encounter 12/04/2014  . 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 Impingement syndrome of right shoulder 05/28/2014  . Right supraspinatus tenosynovitis 05/28/2014  . Arthritis pain 04/03/2014  . Fatigue 04/03/2014  . 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 IBS (irritable bowel syndrome) 06/20/2012  . Lumbar disc disease 06/20/2012   Janna Arch, PT, DPT   08/29/2019, 12:53 PM  [place] MAIN [facility] SERVICES [address] [place], [place], [address] Phone: [phone]   Fax:  [phone]  Name: SHAHZODA STREMEL MRN: [id number] Date of Birth: 12/05/1960

## 2019-08-30 ENCOUNTER — Other Ambulatory Visit: Payer: Self-pay | Admitting: Cardiovascular Disease

## 2019-09-02 ENCOUNTER — Ambulatory Visit: Payer: 59

## 2019-09-02 ENCOUNTER — Other Ambulatory Visit: Payer: Self-pay

## 2019-09-02 DIAGNOSIS — M5440 Lumbago with sciatica, unspecified side: Secondary | ICD-10-CM

## 2019-09-02 DIAGNOSIS — R278 Other lack of coordination: Secondary | ICD-10-CM | POA: Diagnosis not present

## 2019-09-02 DIAGNOSIS — M6281 Muscle weakness (generalized): Secondary | ICD-10-CM

## 2019-09-02 NOTE — Therapy (Signed)
[place] MAIN [facility] SERVICES [address] [place], [place], [address] Phone: [phone]   Fax:  [phone]  Physical Therapy Treatment  Patient Details  Name: Lori Morales MRN: [id number] Date of Birth: 04-12-1961 Referring Provider (PT): Tama High III   Encounter Date: 09/02/2019  PT End of Session - 09/02/19 1142    Visit Number  2    Number of Visits  8    Date for PT Re-Evaluation  09/26/19    Authorization Type  2/10 eval 08/29/19    PT Start Time  0845    PT Stop Time  0927    PT Time Calculation (min)  42 min    Activity Tolerance  No increased pain    Behavior During Therapy  [facility] for tasks assessed/performed       Past Medical History:  Diagnosis Date  . Arthritis   . CAD (coronary artery disease), native coronary artery    mild non calcified plaque in mid LAD and prox LCx on coronary CTA 01/2019  . Depression   . GERD (gastroesophageal reflux disease)   . Headache    migraines  . Hx of squamous cell carcinoma 07/11/2016   Right distal dorsum lat forearm   . Hypertension   . IBS (irritable bowel syndrome)   . Palpitations   . PFO (patent foramen ovale)    small by cardiac CTA 01/2019  . PONV (postoperative nausea and vomiting)   . Restless legs syndrome     Past Surgical History:  Procedure Laterality Date  . ABDOMINOPLASTY    . BACK SURGERY     1 laminectomy, 1 fusion  . COLONOSCOPY    . COLONOSCOPY WITH PROPOFOL N/A 01/17/2017   Procedure: COLONOSCOPY WITH PROPOFOL;  Surgeon: Lollie Sails, MD;  Location: [facility] ENDOSCOPY;  Service: Endoscopy;  Laterality: N/A;  . WISDOM TOOTH EXTRACTION      There were no vitals filed for this visit.  Subjective Assessment - 09/02/19 1140    Subjective  Patient reports this weekend was fun but limited by pain. Did her HEP but the car ride to [place] was challenging.    Pertinent History  Patient is a pleasant 59 year old female who presents with LBP w sciatica of L  side. PMH of hyperglycemia, elevated BP, arthritis, HLD, restless legs, chronic insomnia, prior vertiginous migraine, thalamic CVA, anxiety, CAD, depression, GERD, hx of cancer, IBS, lumbar disc disease (prior back surgery with fusion x2 L4-5, S1), and cervical disc disease. Her mother recently passed away with hospice. Has additionally worsening neck issues with MRI revealing disc bulge  with unsuccessful steroid taper. Per physician note pain is on left side of neck and down left shoulder. Additionally has discomfort in SI joint (left). Severe increase in low back radiating pain began a week prior to PT eval (08/18/19) with acute onset of weakness. 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    Limitations  Sitting;Lifting;Standing;Walking;House hold activities    How long can you sit comfortably?  ease off after a whie sitting but immediately painful    How long can you stand comfortably?  immediately painful and then eases off    How long can you walk comfortably?  a couple of minutes    Patient Stated Goals  pain to go away    Currently in Pain?  Yes    Pain Score  6  Pain Location  Back    Pain Orientation  Left;Lower    Pain Descriptors / Indicators  Aching;Radiating    Pain Type  Acute pain    Pain Onset  1 to 4 weeks ago    Pain Frequency  Intermittent            Supine:  Hamstring stretch with PT overpressure 60 second holds, leg on PT shoulder Popliteal angle lengthening LLE limited 60 seconds  SLR nerve glides with LLE on PT shoulder 20x, cues for velocity of movement 5x5 sciatic nerve glide with patient holding behind knee Piriformis stretch ; modified due to pain 30 second holds SAD with belt around patient's hip in inferior direction 5x 20 second holds Posterior pelvic tilts 10x with cueing for optimal muscle recruitment TrA activation pressing into swiss ball with hands and knees for optimal muscle recruitment x  10  Prone:heat pad to upper back STM to lumbar paraspinals, quadratus, and lat musculature with implementation of effleurage and ptrissage x 9 minutes ; noticeable muscle tissue guarding and knots in lateral aspect roller to piriformis bilaterally x 3 minutes                  PT Education - 09/02/19 1141    Education Details  exercise technique, manual, body mechanics    Person(s) Educated  Patient    Methods  Explanation;Demonstration;Tactile cues;Verbal cues    Comprehension  Verbalized understanding;Returned demonstration;Verbal cues required;Tactile cues required       PT Short Term Goals - 08/29/19 1242      PT SHORT TERM GOAL #1   Title  Patient will be independent in home exercise program to improve strength/mobility for better functional independence with ADLs.    Baseline  4/1: HEP given    Time  2    Period  Weeks    Status  New    Target Date  09/26/19        PT Long Term Goals - 08/29/19 1243      PT LONG TERM GOAL #1   Title  Patient will report a worst pain of 3/10 on VAS in low back  to improve tolerance with ADLs and reduced symptoms with activities.    Baseline  4/1: 8/10    Time  4    Period  Weeks    Status  New    Target Date  09/26/19      PT LONG TERM GOAL #2   Title  Patient will increase FOTO score to equal to or greater than  56/100   to demonstrate statistically significant improvement in mobility and quality of life.    Baseline  4/1: 44/100    Time  4    Period  Weeks    Status  New    Target Date  09/26/19      PT LONG TERM GOAL #3   Title  Patient will increase BLE gross strength to 4+/5 as to improve functional strength for independent gait, increased standing tolerance and increased ADL ability.    Baseline  4/1:see note    Time  4    Period  Weeks    Status  New    Target Date  09/26/19      PT LONG TERM GOAL #4   Title  Patient will tolerate sitting unsupported for 15+ minutes to demonstrate improved back  extensor strength and improved sitting tolerance.    Baseline  4/1: painful to sit at work  Time  4    Period  Weeks    Status  New    Target Date  09/26/19            Plan - 09/02/19 1143    Clinical Impression Statement  Patient reports reduction of back pain at end of session as well as improved gait mechanics with decreased episodes of antalgic gait. Multiple regions of muscle tissue tension noted with muscle guarding improved with manual and therex. Nerve glides introduced and tolerated well. Patient would benefit from skilled physical therapy to decrease pain and improve mobility to return patient to PLOF.    Personal Factors and Comorbidities  Age;Comorbidity 3+;Past/Current Experience;Profession;Social Background    Comorbidities  hyperglycemia, elevated BP, arthritis, HLD, restless legs, chronic insomnia, prior vertiginous migraine, thalamic CVA, anxiety, CAD, depression, GERD, hx of cancer, IBS, lumbar disc disease (prior back surgery with fusion x2 L4-5, S1), and cervical disc disease.    Examination-Activity Limitations  Bathing;Bed Mobility;Bend;Caring for [employer];Locomotion Level;Lift;Stand;Toileting;Transfers    Examination-Participation Restrictions  Cleaning;Community Activity;Driving;Interpersonal Relationship;Laundry;Volunteer;Meal Prep;Yard Work;Other    Stability/Clinical Decision Making  Evolving/Moderate complexity    Rehab Potential  Fair    PT Frequency  2x / week    PT Duration  4 weeks    PT Treatment/Interventions  ADLs/Self Care Home Management;Biofeedback;Cryotherapy;Electrical Stimulation;Iontophoresis 4mg /ml Dexamethasone;Moist Heat;Traction;Ultrasound;DME Instruction;Gait training;Stair training;Functional mobility training;Therapeutic activities;Patient/family education;Neuromuscular re-education;Balance training;Therapeutic exercise;Manual techniques;Energy conservation;Dry needling;Passive range of  motion;Taping;Vestibular;Vasopneumatic Device    PT Next Visit Plan  review HEP, distraction, STM, repeated motions    PT Home Exercise Plan  see above    Consulted and Agree with Plan of Care  Patient       Patient will benefit from skilled therapeutic intervention in order to improve the following deficits and impairments:  Abnormal gait, Decreased activity tolerance, Decreased coordination, Decreased mobility, Decreased range of motion, Difficulty walking, Decreased strength, Hypomobility, Impaired flexibility, Impaired perceived functional ability, Increased muscle spasms, Postural dysfunction, Improper body mechanics, Pain  Visit Diagnosis: Acute left-sided low back pain with sciatica, sciatica laterality unspecified  Muscle weakness (generalized)     Problem List Patient Active Problem List   Diagnosis Date Noted  . PFO (patent foramen ovale)   . CAD (coronary artery disease), native coronary artery   . Acute CVA (cerebrovascular accident) ([place]) 06/15/2018  . Depression 10/16/2017  . Migraine 10/16/2017  . Hyperglycemia, unspecified 07/10/2017  . Hyperglycemia 07/10/2017  . H/O dizziness 08/02/2016  . Cervical disc disorder with radiculopathy of cervical region 04/12/2016  . Nonallopathic lesion of thoracic region 04/12/2016  . Nonallopathic lesion of rib cage 04/12/2016  . Facet arthritis of cervical region 03/21/2016  . Hyperlipidemia 01/26/2015  . Labral tear of shoulder, right, subsequent encounter 12/04/2014  . 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 Impingement syndrome of right shoulder 05/28/2014  . Right supraspinatus tenosynovitis 05/28/2014  . Arthritis pain 04/03/2014  . Fatigue 04/03/2014  . 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 IBS (irritable bowel syndrome) 06/20/2012  . Lumbar disc disease 06/20/2012   Janna Arch, PT, DPT   09/02/2019, 11:45 AM  [place] MAIN [facility] SERVICES [address]  [place], [place], [address] Phone: [phone]   Fax:  [phone]  Name: PRETTY DILEO MRN: [id number] Date of Birth: 27-Jul-1960

## 2019-09-05 ENCOUNTER — Ambulatory Visit: Payer: 59

## 2019-09-05 ENCOUNTER — Other Ambulatory Visit: Payer: Self-pay

## 2019-09-05 DIAGNOSIS — M6281 Muscle weakness (generalized): Secondary | ICD-10-CM | POA: Diagnosis not present

## 2019-09-05 DIAGNOSIS — R278 Other lack of coordination: Secondary | ICD-10-CM

## 2019-09-05 DIAGNOSIS — M5440 Lumbago with sciatica, unspecified side: Secondary | ICD-10-CM | POA: Diagnosis not present

## 2019-09-05 NOTE — Therapy (Signed)
[place] MAIN [facility] SERVICES [address] [place], [place], [address] Phone: [phone]   Fax:  [phone]  Physical Therapy Treatment  Patient Details  Name: Lori Morales MRN: [id number] Date of Birth: 1960-09-30 Referring Provider (PT): Tama High III   Encounter Date: 09/05/2019  PT End of Session - 09/05/19 1638    Visit Number  3    Number of Visits  8    Date for PT Re-Evaluation  09/26/19    Authorization Type  3/10 eval 08/29/19    PT Start Time  0846    PT Stop Time  0928    PT Time Calculation (min)  42 min    Activity Tolerance  Patient limited by pain;Patient tolerated treatment well    Behavior During Therapy  [facility] for tasks assessed/performed       Past Medical History:  Diagnosis Date  . Arthritis   . CAD (coronary artery disease), native coronary artery    mild non calcified plaque in mid LAD and prox LCx on coronary CTA 01/2019  . Depression   . GERD (gastroesophageal reflux disease)   . Headache    migraines  . Hx of squamous cell carcinoma 07/11/2016   Right distal dorsum lat forearm   . Hypertension   . IBS (irritable bowel syndrome)   . Palpitations   . PFO (patent foramen ovale)    small by cardiac CTA 01/2019  . PONV (postoperative nausea and vomiting)   . Restless legs syndrome     Past Surgical History:  Procedure Laterality Date  . ABDOMINOPLASTY    . BACK SURGERY     1 laminectomy, 1 fusion  . COLONOSCOPY    . COLONOSCOPY WITH PROPOFOL N/A 01/17/2017   Procedure: COLONOSCOPY WITH PROPOFOL;  Surgeon: Lollie Sails, MD;  Location: [facility] ENDOSCOPY;  Service: Endoscopy;  Laterality: N/A;  . WISDOM TOOTH EXTRACTION      There were no vitals filed for this visit.  Subjective Assessment - 09/05/19 1637    Subjective  Patient reports the day of her last session was good, with decreased pain, however upon night time had return of pain. Is noticing pain is more centralized now however with  decreased depth down leg.    Pertinent History  Patient is a pleasant 59 year old female who presents with LBP w sciatica of L side. PMH of hyperglycemia, elevated BP, arthritis, HLD, restless legs, chronic insomnia, prior vertiginous migraine, thalamic CVA, anxiety, CAD, depression, GERD, hx of cancer, IBS, lumbar disc disease (prior back surgery with fusion x2 L4-5, S1), and cervical disc disease. Her mother recently passed away with hospice. Has additionally worsening neck issues with MRI revealing disc bulge  with unsuccessful steroid taper. Per physician note pain is on left side of neck and down left shoulder. Additionally has discomfort in SI joint (left). Severe increase in low back radiating pain began a week prior to PT eval (08/18/19) with acute onset of weakness. 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    Limitations  Sitting;Lifting;Standing;Walking;House hold activities    How long can you sit comfortably?  ease off after a whie sitting but immediately painful    How long can you stand comfortably?  immediately painful and then eases off    How long can you walk comfortably?  a couple of minutes    Patient Stated Goals  pain to go away  Currently in Pain?  Yes    Pain Score  5     Pain Location  Back    Pain Orientation  Left;Lower    Pain Descriptors / Indicators  Aching;Radiating    Pain Type  Acute pain    Pain Onset  1 to 4 weeks ago    Pain Frequency  Intermittent            Supine:  Hamstring stretch with PT overpressure 60 second holds, leg on PT shoulder Popliteal angle lengthening LLE limited 60 seconds  SLR nerve glides with LLE on PT shoulder 20x, cues for velocity of movement 5x5 sciatic nerve glide with patient holding behind knee Piriformis stretch ; modified due to pain 30 second holds Cross body single knee to chest hold 30 seconds  Posterior pelvic tilts 10x with cueing for optimal muscle  recruitment TrA activation pressing into swiss ball with hands and knees for optimal muscle recruitment x 10  TrA activation with heel slide, focus on core activation and progression 10x each LE.  isometric contract relax pressing into PT shoulder LLE 10x 5 second hold  Prone: STM to lumbar paraspinals, quadratus, and lat musculature with implementation of effleurage and ptrissage x 13 minutes ; noticeable muscle tissue guarding and knots in lateral aspect STM to piriformis L sided x2 minutes    Pt educated throughout session about proper posture and technique with exercises. Improved exercise technique, movement at target joints, use of target muscles after min to mod verbal, visual, tactile cues    Patient presents with improved centralization of symptoms. Progression of core stability after reduction of muscle guarding is tolerated, as well as gentle LLE strengthening.  Patient would benefit from skilled physical therapy to decrease pain and improve mobility to return patient to PLOF.                 PT Education - 09/05/19 1638    Education Details  exercise technique, core activation    Person(s) Educated  Patient    Methods  Explanation;Demonstration;Tactile cues;Verbal cues    Comprehension  Verbalized understanding;Returned demonstration;Verbal cues required;Tactile cues required       PT Short Term Goals - 08/29/19 1242      PT SHORT TERM GOAL #1   Title  Patient will be independent in home exercise program to improve strength/mobility for better functional independence with ADLs.    Baseline  4/1: HEP given    Time  2    Period  Weeks    Status  New    Target Date  09/26/19        PT Long Term Goals - 08/29/19 1243      PT LONG TERM GOAL #1   Title  Patient will report a worst pain of 3/10 on VAS in low back  to improve tolerance with ADLs and reduced symptoms with activities.    Baseline  4/1: 8/10    Time  4    Period  Weeks    Status  New     Target Date  09/26/19      PT LONG TERM GOAL #2   Title  Patient will increase FOTO score to equal to or greater than  56/100   to demonstrate statistically significant improvement in mobility and quality of life.    Baseline  4/1: 44/100    Time  4    Period  Weeks    Status  New    Target Date  09/26/19  PT LONG TERM GOAL #3   Title  Patient will increase BLE gross strength to 4+/5 as to improve functional strength for independent gait, increased standing tolerance and increased ADL ability.    Baseline  4/1:see note    Time  4    Period  Weeks    Status  New    Target Date  09/26/19      PT LONG TERM GOAL #4   Title  Patient will tolerate sitting unsupported for 15+ minutes to demonstrate improved back extensor strength and improved sitting tolerance.    Baseline  4/1: painful to sit at work    Time  4    Period  Weeks    Status  New    Target Date  09/26/19            Plan - 09/05/19 1640    Clinical Impression Statement  Patient presents with improved centralization of symptoms. Progression of core stability after reduction of muscle guarding is tolerated, as well as gentle LLE strengthening.  Patient would benefit from skilled physical therapy to decrease pain and improve mobility to return patient to PLOF.    Personal Factors and Comorbidities  Age;Comorbidity 3+;Past/Current Experience;Profession;Social Background    Comorbidities  hyperglycemia, elevated BP, arthritis, HLD, restless legs, chronic insomnia, prior vertiginous migraine, thalamic CVA, anxiety, CAD, depression, GERD, hx of cancer, IBS, lumbar disc disease (prior back surgery with fusion x2 L4-5, S1), and cervical disc disease.    Examination-Activity Limitations  Bathing;Bed Mobility;Bend;Caring for [employer];Locomotion Level;Lift;Stand;Toileting;Transfers    Examination-Participation Restrictions  Cleaning;Community Activity;Driving;Interpersonal  Relationship;Laundry;Volunteer;Meal Prep;Yard Work;Other    Stability/Clinical Decision Making  Evolving/Moderate complexity    Rehab Potential  Fair    PT Frequency  2x / week    PT Duration  4 weeks    PT Treatment/Interventions  ADLs/Self Care Home Management;Biofeedback;Cryotherapy;Electrical Stimulation;Iontophoresis 4mg /ml Dexamethasone;Moist Heat;Traction;Ultrasound;DME Instruction;Gait training;Stair training;Functional mobility training;Therapeutic activities;Patient/family education;Neuromuscular re-education;Balance training;Therapeutic exercise;Manual techniques;Energy conservation;Dry needling;Passive range of motion;Taping;Vestibular;Vasopneumatic Device    PT Next Visit Plan  review HEP, distraction, STM, repeated motions    PT Home Exercise Plan  see above    Consulted and Agree with Plan of Care  Patient       Patient will benefit from skilled therapeutic intervention in order to improve the following deficits and impairments:  Abnormal gait, Decreased activity tolerance, Decreased coordination, Decreased mobility, Decreased range of motion, Difficulty walking, Decreased strength, Hypomobility, Impaired flexibility, Impaired perceived functional ability, Increased muscle spasms, Postural dysfunction, Improper body mechanics, Pain  Visit Diagnosis: Acute left-sided low back pain with sciatica, sciatica laterality unspecified  Other lack of coordination  Muscle weakness (generalized)     Problem List Patient Active Problem List   Diagnosis Date Noted  . PFO (patent foramen ovale)   . CAD (coronary artery disease), native coronary artery   . Acute CVA (cerebrovascular accident) ([place]) 06/15/2018  . Depression 10/16/2017  . Migraine 10/16/2017  . Hyperglycemia, unspecified 07/10/2017  . Hyperglycemia 07/10/2017  . H/O dizziness 08/02/2016  . Cervical disc disorder with radiculopathy of cervical region 04/12/2016  . Nonallopathic lesion of thoracic region 04/12/2016  .  Nonallopathic lesion of rib cage 04/12/2016  . Facet arthritis of cervical region 03/21/2016  . Hyperlipidemia 01/26/2015  . Labral tear of shoulder, right, subsequent encounter 12/04/2014  . 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 Impingement syndrome of right shoulder 05/28/2014  . Right supraspinatus tenosynovitis 05/28/2014  . Arthritis pain 04/03/2014  . Fatigue 04/03/2014  . 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  IBS (irritable bowel syndrome) 06/20/2012  . Lumbar disc disease 06/20/2012   Janna Arch, PT, DPT   09/05/2019, 4:42 PM  [place] MAIN [facility] SERVICES [address] [place], [place], [address] Phone: [phone]   Fax:  [phone]  Name: Lori Morales MRN: [id number] Date of Birth: Jun 16, 1960

## 2019-09-10 ENCOUNTER — Other Ambulatory Visit: Payer: Self-pay

## 2019-09-10 ENCOUNTER — Ambulatory Visit: Payer: 59

## 2019-09-10 DIAGNOSIS — M5440 Lumbago with sciatica, unspecified side: Secondary | ICD-10-CM

## 2019-09-10 DIAGNOSIS — R278 Other lack of coordination: Secondary | ICD-10-CM

## 2019-09-10 DIAGNOSIS — M6281 Muscle weakness (generalized): Secondary | ICD-10-CM | POA: Diagnosis not present

## 2019-09-10 NOTE — Therapy (Signed)
[place] MAIN [facility] SERVICES [address] [place], [place], [address] Phone: [phone]   Fax:  [phone]  Physical Therapy Treatment  Patient Details  Name: Lori Morales MRN: [id number] Date of Birth: 12/10/1960 Referring Provider (PT): Tama High III   Encounter Date: 09/10/2019  PT End of Session - 09/10/19 1058    Visit Number  4    Number of Visits  8    Date for PT Re-Evaluation  09/26/19    Authorization Type  4/10 eval 08/29/19    PT Start Time  0945    PT Stop Time  1027    PT Time Calculation (min)  42 min    Activity Tolerance  Patient tolerated treatment well    Behavior During Therapy  [facility] for tasks assessed/performed       Past Medical History:  Diagnosis Date  . Arthritis   . CAD (coronary artery disease), native coronary artery    mild non calcified plaque in mid LAD and prox LCx on coronary CTA 01/2019  . Depression   . GERD (gastroesophageal reflux disease)   . Headache    migraines  . Hx of squamous cell carcinoma 07/11/2016   Right distal dorsum lat forearm   . Hypertension   . IBS (irritable bowel syndrome)   . Palpitations   . PFO (patent foramen ovale)    small by cardiac CTA 01/2019  . PONV (postoperative nausea and vomiting)   . Restless legs syndrome     Past Surgical History:  Procedure Laterality Date  . ABDOMINOPLASTY    . BACK SURGERY     1 laminectomy, 1 fusion  . COLONOSCOPY    . COLONOSCOPY WITH PROPOFOL N/A 01/17/2017   Procedure: COLONOSCOPY WITH PROPOFOL;  Surgeon: Lollie Sails, MD;  Location: [facility] ENDOSCOPY;  Service: Endoscopy;  Laterality: N/A;  . WISDOM TOOTH EXTRACTION      There were no vitals filed for this visit.  Subjective Assessment - 09/10/19 1047    Subjective  Patient reports an improvement of symptoms in back with decreased radiation, improved pain control. New onset of neck pain this morning more painful than back this morning.    Pertinent History   Patient is a pleasant 59 year old female who presents with LBP w sciatica of L side. PMH of hyperglycemia, elevated BP, arthritis, HLD, restless legs, chronic insomnia, prior vertiginous migraine, thalamic CVA, anxiety, CAD, depression, GERD, hx of cancer, IBS, lumbar disc disease (prior back surgery with fusion x2 L4-5, S1), and cervical disc disease. Her mother recently passed away with hospice. Has additionally worsening neck issues with MRI revealing disc bulge  with unsuccessful steroid taper. Per physician note pain is on left side of neck and down left shoulder. Additionally has discomfort in SI joint (left). Severe increase in low back radiating pain began a week prior to PT eval (08/18/19) with acute onset of weakness. 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    Limitations  Sitting;Lifting;Standing;Walking;House hold activities    How long can you sit comfortably?  ease off after a whie sitting but immediately painful    How long can you stand comfortably?  immediately painful and then eases off    How long can you walk comfortably?  a couple of minutes    Patient Stated Goals  pain to go away    Currently in Pain?  Yes  Pain Score  3     Pain Location  Back    Pain Orientation  Lower    Pain Descriptors / Indicators  Aching    Pain Type  Acute pain    Pain Onset  1 to 4 weeks ago    Pain Frequency  Intermittent           Supine:  Hamstring stretch with PT overpressure 60 second holds, leg on PT shoulder Popliteal angle lengthening LLE limited 60 seconds  SLR nerve glides with LLE on PT shoulder 20x, cues for velocity of movement 5x5 sciatic nerve glide with patient holding behind knee Piriformis stretch ; modified due to pain 30 second holds Cross body single knee to chest hold 30 seconds    Prone: STM to lumbar paraspinals, quadratus, and lat musculature with implementation of effleurage and ptrissage x 13 minutes ;  noticeable muscle tissue guarding and knots in lateral aspect    Pt educated throughout session about proper posture and technique with exercises. Improved exercise technique, movement at target joints, use of target muscles after min to mod verbal, visual, tactile cues   HEP Progression: Demonstrate and perform   Access Code: [email or web address] URL: [email or web address] Date: 09/09/2019 Prepared by: Janna Arch  Exercises  . Bird Dog - 1 x daily - 7 x weekly - 10 reps - 2 sets - 5 hold . Cat-Camel - 1 x daily - 7 x weekly - 10 reps - 2 sets - 5 hold . Supine Figure 4 Piriformis Stretch - 1 x daily - 7 x weekly - 10 reps - 2 sets - 5 hold . Supine Piriformis Stretch - 1 x daily - 7 x weekly - 10 reps - 2 sets - 5 hold . Seated Thoracic Lumbar Extension - 1 x daily - 7 x weekly - 10 reps - 2 sets - 5 hold . Kneeling Thoracic Extension Stretch with Swiss Ball - 1 x daily - 7 x weekly - 10 reps - 2 sets - 5 hold    Patient continues to demonstrate improved pain levels and centralization of symptoms allowing progression of HEP to be performed/given. Patient demonstrated understanding of HEP. Use of heat pad allows for reduction of tension and improves patient performance of interventions. Patient would benefit from skilled physical therapy to decrease pain and improve mobility to return patient to PLOF          PT Education - 09/10/19 1057    Education Details  exercise technique, HEP progression    Person(s) Educated  Patient    Methods  Explanation;Demonstration;Tactile cues;Verbal cues;Handout    Comprehension  Verbalized understanding;Returned demonstration;Verbal cues required;Tactile cues required       PT Short Term Goals - 08/29/19 1242      PT SHORT TERM GOAL #1   Title  Patient will be independent in home exercise program to improve strength/mobility for better functional independence with ADLs.    Baseline  4/1: HEP given    Time  2    Period  Weeks     Status  New    Target Date  09/26/19        PT Long Term Goals - 08/29/19 1243      PT LONG TERM GOAL #1   Title  Patient will report a worst pain of 3/10 on VAS in low back  to improve tolerance with ADLs and reduced symptoms with activities.    Baseline  4/1: 8/10    Time  4  Period  Weeks    Status  New    Target Date  09/26/19      PT LONG TERM GOAL #2   Title  Patient will increase FOTO score to equal to or greater than  56/100   to demonstrate statistically significant improvement in mobility and quality of life.    Baseline  4/1: 44/100    Time  4    Period  Weeks    Status  New    Target Date  09/26/19      PT LONG TERM GOAL #3   Title  Patient will increase BLE gross strength to 4+/5 as to improve functional strength for independent gait, increased standing tolerance and increased ADL ability.    Baseline  4/1:see note    Time  4    Period  Weeks    Status  New    Target Date  09/26/19      PT LONG TERM GOAL #4   Title  Patient will tolerate sitting unsupported for 15+ minutes to demonstrate improved back extensor strength and improved sitting tolerance.    Baseline  4/1: painful to sit at work    Time  4    Period  Weeks    Status  New    Target Date  09/26/19            Plan - 09/10/19 1100    Clinical Impression Statement  Patient continues to demonstrate improved pain levels and centralization of symptoms allowing progression of HEP to be performed/given. Patient demonstrated understanding of HEP. Use of heat pad allows for reduction of tension and improves patient performance of interventions. Patient would benefit from skilled physical therapy to decrease pain and improve mobility to return patient to PLOF    Personal Factors and Comorbidities  Age;Comorbidity 3+;Past/Current Experience;Profession;Social Background    Comorbidities  hyperglycemia, elevated BP, arthritis, HLD, restless legs, chronic insomnia, prior vertiginous migraine, thalamic CVA,  anxiety, CAD, depression, GERD, hx of cancer, IBS, lumbar disc disease (prior back surgery with fusion x2 L4-5, S1), and cervical disc disease.    Examination-Activity Limitations  Bathing;Bed Mobility;Bend;Caring for [employer];Locomotion Level;Lift;Stand;Toileting;Transfers    Examination-Participation Restrictions  Cleaning;Community Activity;Driving;Interpersonal Relationship;Laundry;Volunteer;Meal Prep;Yard Work;Other    Stability/Clinical Decision Making  Evolving/Moderate complexity    Rehab Potential  Fair    PT Frequency  2x / week    PT Duration  4 weeks    PT Treatment/Interventions  ADLs/Self Care Home Management;Biofeedback;Cryotherapy;Electrical Stimulation;Iontophoresis 4mg /ml Dexamethasone;Moist Heat;Traction;Ultrasound;DME Instruction;Gait training;Stair training;Functional mobility training;Therapeutic activities;Patient/family education;Neuromuscular re-education;Balance training;Therapeutic exercise;Manual techniques;Energy conservation;Dry needling;Passive range of motion;Taping;Vestibular;Vasopneumatic Device    PT Next Visit Plan  review HEP, distraction, STM, repeated motions    PT Home Exercise Plan  see above    Consulted and Agree with Plan of Care  Patient       Patient will benefit from skilled therapeutic intervention in order to improve the following deficits and impairments:  Abnormal gait, Decreased activity tolerance, Decreased coordination, Decreased mobility, Decreased range of motion, Difficulty walking, Decreased strength, Hypomobility, Impaired flexibility, Impaired perceived functional ability, Increased muscle spasms, Postural dysfunction, Improper body mechanics, Pain  Visit Diagnosis: Acute left-sided low back pain with sciatica, sciatica laterality unspecified  Other lack of coordination  Muscle weakness (generalized)     Problem List Patient Active Problem List   Diagnosis Date Noted  . PFO  (patent foramen ovale)   . CAD (coronary artery disease), native coronary artery   . Acute CVA (cerebrovascular accident) ([place]) 06/15/2018  .  Depression 10/16/2017  . Migraine 10/16/2017  . Hyperglycemia, unspecified 07/10/2017  . Hyperglycemia 07/10/2017  . H/O dizziness 08/02/2016  . Cervical disc disorder with radiculopathy of cervical region 04/12/2016  . Nonallopathic lesion of thoracic region 04/12/2016  . Nonallopathic lesion of rib cage 04/12/2016  . Facet arthritis of cervical region 03/21/2016  . Hyperlipidemia 01/26/2015  . Labral tear of shoulder, right, subsequent encounter 12/04/2014  . 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 Impingement syndrome of right shoulder 05/28/2014  . Right supraspinatus tenosynovitis 05/28/2014  . Arthritis pain 04/03/2014  . Fatigue 04/03/2014  . 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 IBS (irritable bowel syndrome) 06/20/2012  . Lumbar disc disease 06/20/2012   Janna Arch, PT, DPT   09/10/2019, 11:02 AM  [place] MAIN [facility] SERVICES [address] [place], [place], [address] Phone: [phone]   Fax:  [phone]  Name: Lori Morales MRN: [id number] Date of Birth: 1960-09-11

## 2019-09-12 ENCOUNTER — Ambulatory Visit: Payer: 59

## 2019-09-12 ENCOUNTER — Other Ambulatory Visit: Payer: Self-pay

## 2019-09-12 DIAGNOSIS — M5440 Lumbago with sciatica, unspecified side: Secondary | ICD-10-CM

## 2019-09-12 DIAGNOSIS — R278 Other lack of coordination: Secondary | ICD-10-CM

## 2019-09-12 DIAGNOSIS — M6281 Muscle weakness (generalized): Secondary | ICD-10-CM

## 2019-09-12 NOTE — Therapy (Signed)
[place] MAIN [facility] SERVICES [address] [place], [place], [address] Phone: [phone]   Fax:  [phone]  Physical Therapy Treatment  Patient Details  Name: Lori Morales MRN: [id number] Date of Birth: 1961/03/13 Referring Provider (PT): Tama High III   Encounter Date: 09/12/2019  PT End of Session - 09/12/19 1034    Visit Number  5    Number of Visits  8    Date for PT Re-Evaluation  09/26/19    Authorization Type  5/10 eval 08/29/19    PT Start Time  0945    PT Stop Time  1028    PT Time Calculation (min)  43 min    Activity Tolerance  Patient tolerated treatment well    Behavior During Therapy  [facility] for tasks assessed/performed       Past Medical History:  Diagnosis Date  . Arthritis   . CAD (coronary artery disease), native coronary artery    mild non calcified plaque in mid LAD and prox LCx on coronary CTA 01/2019  . Depression   . GERD (gastroesophageal reflux disease)   . Headache    migraines  . Hx of squamous cell carcinoma 07/11/2016   Right distal dorsum lat forearm   . Hypertension   . IBS (irritable bowel syndrome)   . Palpitations   . PFO (patent foramen ovale)    small by cardiac CTA 01/2019  . PONV (postoperative nausea and vomiting)   . Restless legs syndrome     Past Surgical History:  Procedure Laterality Date  . ABDOMINOPLASTY    . BACK SURGERY     1 laminectomy, 1 fusion  . COLONOSCOPY    . COLONOSCOPY WITH PROPOFOL N/A 01/17/2017   Procedure: COLONOSCOPY WITH PROPOFOL;  Surgeon: Lollie Sails, MD;  Location: [facility] ENDOSCOPY;  Service: Endoscopy;  Laterality: N/A;  . WISDOM TOOTH EXTRACTION      There were no vitals filed for this visit.  Subjective Assessment - 09/12/19 1032    Subjective  Patient reports continued improvement of low back pain, neck pain continues to be present.    Pertinent History  Patient is a pleasant 59 year old female who presents with LBP w sciatica of L side. PMH  of hyperglycemia, elevated BP, arthritis, HLD, restless legs, chronic insomnia, prior vertiginous migraine, thalamic CVA, anxiety, CAD, depression, GERD, hx of cancer, IBS, lumbar disc disease (prior back surgery with fusion x2 L4-5, S1), and cervical disc disease. Her mother recently passed away with hospice. Has additionally worsening neck issues with MRI revealing disc bulge  with unsuccessful steroid taper. Per physician note pain is on left side of neck and down left shoulder. Additionally has discomfort in SI joint (left). Severe increase in low back radiating pain began a week prior to PT eval (08/18/19) with acute onset of weakness. 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    Limitations  Sitting;Lifting;Standing;Walking;House hold activities    How long can you sit comfortably?  ease off after a whie sitting but immediately painful    How long can you stand comfortably?  immediately painful and then eases off    How long can you walk comfortably?  a couple of minutes    Patient Stated Goals  pain to go away    Currently in Pain?  Yes    Pain Score  2     Pain Location  Back  Pain Orientation  Lower    Pain Descriptors / Indicators  Aching    Pain Type  Acute pain    Pain Onset  1 to 4 weeks ago    Pain Frequency  Intermittent    Multiple Pain Sites  Yes    Pain Score  6    Pain Location  Neck    Pain Orientation  Lower;Left    Pain Descriptors / Indicators  Aching;Stabbing    Pain Type  Acute pain    Pain Radiating Towards  arm    Pain Onset  In the past 7 days    Pain Frequency  Intermittent            Supine:  Hamstring stretch with PT overpressure 60 second holds, leg on PT shoulder Popliteal angle lengthening LE limited 60 seconds; performed both LE SLR nerve glides with LE on PT shoulder 20x, cues for velocity of movement; performed both sides 5x5 sciatic nerve glide with patient holding behind  knee Piriformis stretch ; modified due to pain 30 second holds Cross body single knee to chest hold 60 seconds each LE  Posterior pelvic tilts 10x with cueing for optimal muscle recruitment Posterior pelvic tilt with rainbow ball adduction squeeze 15x    Prone: STM to lumbar paraspinals, quadratus, and inferior lat musculature with implementation of effleurage and petrissage x 16 minutes ; noticeable muscle tissue guarding and knots in lateral aspect STM to piriformis L sided x2 minutes    Pt educated throughout session about proper posture and technique with exercises. Improved exercise technique, movement at target joints, use of target muscles after min to mod verbal, visual, tactile cues    Use of heat pad to low back and neck allow for improved muscle tissue relaxation for manipulation and lengthening.                  PT Education - 09/12/19 1033    Education Details  exercise technique, manual can get referral for neck if pain persists    Person(s) Educated  Patient    Methods  Explanation;Demonstration;Tactile cues;Verbal cues    Comprehension  Verbalized understanding;Verbal cues required;Returned demonstration;Tactile cues required       PT Short Term Goals - 08/29/19 1242      PT SHORT TERM GOAL #1   Title  Patient will be independent in home exercise program to improve strength/mobility for better functional independence with ADLs.    Baseline  4/1: HEP given    Time  2    Period  Weeks    Status  New    Target Date  09/26/19        PT Long Term Goals - 08/29/19 1243      PT LONG TERM GOAL #1   Title  Patient will report a worst pain of 3/10 on VAS in low back  to improve tolerance with ADLs and reduced symptoms with activities.    Baseline  4/1: 8/10    Time  4    Period  Weeks    Status  New    Target Date  09/26/19      PT LONG TERM GOAL #2   Title  Patient will increase FOTO score to equal to or greater than  56/100   to demonstrate  statistically significant improvement in mobility and quality of life.    Baseline  4/1: 44/100    Time  4    Period  Weeks    Status  New    Target Date  09/26/19      PT LONG TERM GOAL #3   Title  Patient will increase BLE gross strength to 4+/5 as to improve functional strength for independent gait, increased standing tolerance and increased ADL ability.    Baseline  4/1:see note    Time  4    Period  Weeks    Status  New    Target Date  09/26/19      PT LONG TERM GOAL #4   Title  Patient will tolerate sitting unsupported for 15+ minutes to demonstrate improved back extensor strength and improved sitting tolerance.    Baseline  4/1: painful to sit at work    Time  4    Period  Weeks    Status  New    Target Date  09/26/19            Plan - 09/12/19 1037    Clinical Impression Statement  Patient continues to demonstrate excellent progression with decreased pain level and improved muscle control. Slow progression of strengthening tolerated well with no episodes of pain increase. Patient would benefit from skilled physical therapy to decrease pain and improve mobility to return patient to PLOF    Personal Factors and Comorbidities  Age;Comorbidity 3+;Past/Current Experience;Profession;Social Background    Comorbidities  hyperglycemia, elevated BP, arthritis, HLD, restless legs, chronic insomnia, prior vertiginous migraine, thalamic CVA, anxiety, CAD, depression, GERD, hx of cancer, IBS, lumbar disc disease (prior back surgery with fusion x2 L4-5, S1), and cervical disc disease.    Examination-Activity Limitations  Bathing;Bed Mobility;Bend;Caring for [employer];Locomotion Level;Lift;Stand;Toileting;Transfers    Examination-Participation Restrictions  Cleaning;Community Activity;Driving;Interpersonal Relationship;Laundry;Volunteer;Meal Prep;Yard Work;Other    Stability/Clinical Decision Making  Evolving/Moderate complexity    Rehab  Potential  Fair    PT Frequency  2x / week    PT Duration  4 weeks    PT Treatment/Interventions  ADLs/Self Care Home Management;Biofeedback;Cryotherapy;Electrical Stimulation;Iontophoresis 4mg /ml Dexamethasone;Moist Heat;Traction;Ultrasound;DME Instruction;Gait training;Stair training;Functional mobility training;Therapeutic activities;Patient/family education;Neuromuscular re-education;Balance training;Therapeutic exercise;Manual techniques;Energy conservation;Dry needling;Passive range of motion;Taping;Vestibular;Vasopneumatic Device    PT Next Visit Plan  review HEP, distraction, STM, repeated motions    PT Home Exercise Plan  see above    Consulted and Agree with Plan of Care  Patient       Patient will benefit from skilled therapeutic intervention in order to improve the following deficits and impairments:  Abnormal gait, Decreased activity tolerance, Decreased coordination, Decreased mobility, Decreased range of motion, Difficulty walking, Decreased strength, Hypomobility, Impaired flexibility, Impaired perceived functional ability, Increased muscle spasms, Postural dysfunction, Improper body mechanics, Pain  Visit Diagnosis: Acute left-sided low back pain with sciatica, sciatica laterality unspecified  Other lack of coordination  Muscle weakness (generalized)     Problem List Patient Active Problem List   Diagnosis Date Noted  . PFO (patent foramen ovale)   . CAD (coronary artery disease), native coronary artery   . Acute CVA (cerebrovascular accident) ([place]) 06/15/2018  . Depression 10/16/2017  . Migraine 10/16/2017  . Hyperglycemia, unspecified 07/10/2017  . Hyperglycemia 07/10/2017  . H/O dizziness 08/02/2016  . Cervical disc disorder with radiculopathy of cervical region 04/12/2016  . Nonallopathic lesion of thoracic region 04/12/2016  . Nonallopathic lesion of rib cage 04/12/2016  . Facet arthritis of cervical region 03/21/2016  . Hyperlipidemia 01/26/2015  . Labral  tear of shoulder, right, subsequent encounter 12/04/2014  . 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 Impingement syndrome of right shoulder 05/28/2014  . Right supraspinatus tenosynovitis 05/28/2014  . Arthritis  pain 04/03/2014  . Fatigue 04/03/2014  . 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 IBS (irritable bowel syndrome) 06/20/2012  . Lumbar disc disease 06/20/2012   Janna Arch, PT, DPT   09/12/2019, 10:38 AM  [place] MAIN [facility] SERVICES [address] [place], [place], [address] Phone: [phone]   Fax:  [phone]  Name: Lori Morales MRN: [id number] Date of Birth: 01/04/1961

## 2019-09-17 ENCOUNTER — Other Ambulatory Visit: Payer: Self-pay

## 2019-09-17 ENCOUNTER — Ambulatory Visit: Payer: 59

## 2019-09-17 DIAGNOSIS — M5440 Lumbago with sciatica, unspecified side: Secondary | ICD-10-CM | POA: Diagnosis not present

## 2019-09-17 DIAGNOSIS — M6281 Muscle weakness (generalized): Secondary | ICD-10-CM | POA: Diagnosis not present

## 2019-09-17 DIAGNOSIS — R278 Other lack of coordination: Secondary | ICD-10-CM | POA: Diagnosis not present

## 2019-09-17 NOTE — Therapy (Signed)
[place] MAIN [facility] SERVICES [address] [place], [place], [address] Phone: [phone]   Fax:  [phone]  Physical Therapy Treatment  Patient Details  Name: Lori Morales MRN: [id number] Date of Birth: 1960-06-02 Referring Provider (PT): Tama High III   Encounter Date: 09/17/2019  PT End of Session - 09/17/19 1621    Visit Number  6    Number of Visits  8    Date for PT Re-Evaluation  09/26/19    Authorization Type  6/10 eval 08/29/19    PT Start Time  0845    PT Stop Time  0927    PT Time Calculation (min)  42 min    Activity Tolerance  Patient tolerated treatment well    Behavior During Therapy  [facility] for tasks assessed/performed       Past Medical History:  Diagnosis Date  . Arthritis   . CAD (coronary artery disease), native coronary artery    mild non calcified plaque in mid LAD and prox LCx on coronary CTA 01/2019  . Depression   . GERD (gastroesophageal reflux disease)   . Headache    migraines  . Hx of squamous cell carcinoma 07/11/2016   Right distal dorsum lat forearm   . Hypertension   . IBS (irritable bowel syndrome)   . Palpitations   . PFO (patent foramen ovale)    small by cardiac CTA 01/2019  . PONV (postoperative nausea and vomiting)   . Restless legs syndrome     Past Surgical History:  Procedure Laterality Date  . ABDOMINOPLASTY    . BACK SURGERY     1 laminectomy, 1 fusion  . COLONOSCOPY    . COLONOSCOPY WITH PROPOFOL N/A 01/17/2017   Procedure: COLONOSCOPY WITH PROPOFOL;  Surgeon: Lollie Sails, MD;  Location: [facility] ENDOSCOPY;  Service: Endoscopy;  Laterality: N/A;  . WISDOM TOOTH EXTRACTION      There were no vitals filed for this visit.  Subjective Assessment - 09/17/19 1620    Subjective  Patient reports back pain radiated this morning, is improving with her stretches but slightly higher than last session.    Pertinent History  Patient is a pleasant 59 year old female who presents with  LBP w sciatica of L side. PMH of hyperglycemia, elevated BP, arthritis, HLD, restless legs, chronic insomnia, prior vertiginous migraine, thalamic CVA, anxiety, CAD, depression, GERD, hx of cancer, IBS, lumbar disc disease (prior back surgery with fusion x2 L4-5, S1), and cervical disc disease. Her mother recently passed away with hospice. Has additionally worsening neck issues with MRI revealing disc bulge  with unsuccessful steroid taper. Per physician note pain is on left side of neck and down left shoulder. Additionally has discomfort in SI joint (left). Severe increase in low back radiating pain began a week prior to PT eval (08/18/19) with acute onset of weakness. 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    Limitations  Sitting;Lifting;Standing;Walking;House hold activities    How long can you sit comfortably?  ease off after a whie sitting but immediately painful    How long can you stand comfortably?  immediately painful and then eases off    How long can you walk comfortably?  a couple of minutes    Patient Stated Goals  pain to go away    Currently in Pain?  Yes    Pain Score  3  Pain Location  Back    Pain Orientation  Lower    Pain Descriptors / Indicators  Aching;Radiating    Pain Type  Acute pain    Pain Radiating Towards  LLE    Pain Onset  1 to 4 weeks ago    Pain Frequency  Intermittent         Supine:  Hamstring stretch with PT overpressure 60 second holds, leg on PT shoulder Popliteal angle lengthening LE limited 60 seconds; performed both LE SLR nerve glides with LE on PT shoulder 20x, cues for velocity of movement; performed both sides 5x5 sciatic nerve glide with patient holding behind knee Piriformis stretch ;60 second holds Cross body single knee to chest hold 60 seconds each LE      Prone: STM to lumbar paraspinals, quadratus, and inferior lat musculature with implementation of effleurage and petrissage  x 19 minutes ; noticeable muscle tissue guarding and knots in lateral aspect STM to piriformis each side x2 minutes    Pt educated throughout session about proper posture and technique with exercises. Improved exercise technique, movement at target joints, use of target muscles after min to mod verbal, visual, tactile cues   Use of heat pad to low back and neck allow for improved muscle tissue relaxation for manipulation and lengthening.      Patient presented with increased guarding of low back upon arrival resulting in focused session on lengthening and pain reduction. Combined manual and therex resulted in centralization of pain, decreased pain level, and improved gait mechanics by end of session. Patient would benefit from skilled physical therapy to decrease pain and improve mobility to return patient to [facility]                    PT Education - 09/17/19 1621    Education Details  exercise technique, manual    Person(s) Educated  Patient    Methods  Explanation;Demonstration;Tactile cues;Verbal cues    Comprehension  Verbalized understanding;Returned demonstration;Verbal cues required;Tactile cues required       PT Short Term Goals - 08/29/19 1242      PT SHORT TERM GOAL #1   Title  Patient will be independent in home exercise program to improve strength/mobility for better functional independence with ADLs.    Baseline  4/1: HEP given    Time  2    Period  Weeks    Status  New    Target Date  09/26/19        PT Long Term Goals - 08/29/19 1243      PT LONG TERM GOAL #1   Title  Patient will report a worst pain of 3/10 on VAS in low back  to improve tolerance with ADLs and reduced symptoms with activities.    Baseline  4/1: 8/10    Time  4    Period  Weeks    Status  New    Target Date  09/26/19      PT LONG TERM GOAL #2   Title  Patient will increase FOTO score to equal to or greater than  56/100   to demonstrate statistically significant improvement in  mobility and quality of life.    Baseline  4/1: 44/100    Time  4    Period  Weeks    Status  New    Target Date  09/26/19      PT LONG TERM GOAL #3   Title  Patient will increase BLE gross strength  to 4+/5 as to improve functional strength for independent gait, increased standing tolerance and increased ADL ability.    Baseline  4/1:see note    Time  4    Period  Weeks    Status  New    Target Date  09/26/19      PT LONG TERM GOAL #4   Title  Patient will tolerate sitting unsupported for 15+ minutes to demonstrate improved back extensor strength and improved sitting tolerance.    Baseline  4/1: painful to sit at work    Time  4    Period  Weeks    Status  New    Target Date  09/26/19            Plan - 09/17/19 1623    Clinical Impression Statement  Patient presented with increased guarding of low back upon arrival resulting in focused session on lengthening and pain reduction. Combined manual and therex resulted in centralization of pain, decreased pain level, and improved gait mechanics by end of session. Patient would benefit from skilled physical therapy to decrease pain and improve mobility to return patient to PLOF    Personal Factors and Comorbidities  Age;Comorbidity 3+;Past/Current Experience;Profession;Social Background    Comorbidities  hyperglycemia, elevated BP, arthritis, HLD, restless legs, chronic insomnia, prior vertiginous migraine, thalamic CVA, anxiety, CAD, depression, GERD, hx of cancer, IBS, lumbar disc disease (prior back surgery with fusion x2 L4-5, S1), and cervical disc disease.    Examination-Activity Limitations  Bathing;Bed Mobility;Bend;Caring for [employer];Locomotion Level;Lift;Stand;Toileting;Transfers    Examination-Participation Restrictions  Cleaning;Community Activity;Driving;Interpersonal Relationship;Laundry;Volunteer;Meal Prep;Yard Work;Other    Stability/Clinical Decision Making   Evolving/Moderate complexity    Rehab Potential  Fair    PT Frequency  2x / week    PT Duration  4 weeks    PT Treatment/Interventions  ADLs/Self Care Home Management;Biofeedback;Cryotherapy;Electrical Stimulation;Iontophoresis 4mg /ml Dexamethasone;Moist Heat;Traction;Ultrasound;DME Instruction;Gait training;Stair training;Functional mobility training;Therapeutic activities;Patient/family education;Neuromuscular re-education;Balance training;Therapeutic exercise;Manual techniques;Energy conservation;Dry needling;Passive range of motion;Taping;Vestibular;Vasopneumatic Device    PT Next Visit Plan  review HEP, distraction, STM, repeated motions    PT Home Exercise Plan  see above    Consulted and Agree with Plan of Care  Patient       Patient will benefit from skilled therapeutic intervention in order to improve the following deficits and impairments:  Abnormal gait, Decreased activity tolerance, Decreased coordination, Decreased mobility, Decreased range of motion, Difficulty walking, Decreased strength, Hypomobility, Impaired flexibility, Impaired perceived functional ability, Increased muscle spasms, Postural dysfunction, Improper body mechanics, Pain  Visit Diagnosis: Acute left-sided low back pain with sciatica, sciatica laterality unspecified  Other lack of coordination  Muscle weakness (generalized)     Problem List Patient Active Problem List   Diagnosis Date Noted  . PFO (patent foramen ovale)   . CAD (coronary artery disease), native coronary artery   . Acute CVA (cerebrovascular accident) ([place]) 06/15/2018  . Depression 10/16/2017  . Migraine 10/16/2017  . Hyperglycemia, unspecified 07/10/2017  . Hyperglycemia 07/10/2017  . H/O dizziness 08/02/2016  . Cervical disc disorder with radiculopathy of cervical region 04/12/2016  . Nonallopathic lesion of thoracic region 04/12/2016  . Nonallopathic lesion of rib cage 04/12/2016  . Facet arthritis of cervical region 03/21/2016   . Hyperlipidemia 01/26/2015  . Labral tear of shoulder, right, subsequent encounter 12/04/2014  . 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 Impingement syndrome of right shoulder 05/28/2014  . Right supraspinatus tenosynovitis 05/28/2014  . Arthritis pain 04/03/2014  . Fatigue 04/03/2014  . 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  IBS (irritable bowel syndrome) 06/20/2012  . Lumbar disc disease 06/20/2012   Janna Arch, PT, DPT   09/17/2019, 4:25 PM  [place] MAIN [facility] SERVICES [address] [place], [place], [address] Phone: [phone]   Fax:  [phone]  Name: Lori Morales MRN: [id number] Date of Birth: [birth year]

## 2019-09-19 ENCOUNTER — Other Ambulatory Visit: Payer: Self-pay

## 2019-09-19 ENCOUNTER — Ambulatory Visit: Payer: 59

## 2019-09-19 DIAGNOSIS — M5440 Lumbago with sciatica, unspecified side: Secondary | ICD-10-CM | POA: Diagnosis not present

## 2019-09-19 DIAGNOSIS — R278 Other lack of coordination: Secondary | ICD-10-CM

## 2019-09-19 DIAGNOSIS — M6281 Muscle weakness (generalized): Secondary | ICD-10-CM

## 2019-09-19 MED FILL — LOSARTAN POTASSIUM 25 MG TA: 25 | 30 days supply | Qty: 30 | Fill #1

## 2019-09-19 NOTE — Therapy (Signed)
[place] MAIN [facility] SERVICES [address] [place], [place], [address] Phone: [phone]   Fax:  [phone]  Physical Therapy Treatment  Patient Details  Name: Lori Morales MRN: [id number] Date of Birth: 05/07/1961 Referring Provider (PT): Tama High III   Encounter Date: 09/19/2019  PT End of Session - 09/19/19 1101    Visit Number  7    Number of Visits  8    Date for PT Re-Evaluation  09/26/19    Authorization Type  7/10 eval 08/29/19    PT Start Time  0946    PT Stop Time  1032    PT Time Calculation (min)  46 min    Activity Tolerance  Patient tolerated treatment well    Behavior During Therapy  [facility] for tasks assessed/performed       Past Medical History:  Diagnosis Date  . Arthritis   . CAD (coronary artery disease), native coronary artery    mild non calcified plaque in mid LAD and prox LCx on coronary CTA 01/2019  . Depression   . GERD (gastroesophageal reflux disease)   . Headache    migraines  . Hx of squamous cell carcinoma 07/11/2016   Right distal dorsum lat forearm   . Hypertension   . IBS (irritable bowel syndrome)   . Palpitations   . PFO (patent foramen ovale)    small by cardiac CTA 01/2019  . PONV (postoperative nausea and vomiting)   . Restless legs syndrome     Past Surgical History:  Procedure Laterality Date  . ABDOMINOPLASTY    . BACK SURGERY     1 laminectomy, 1 fusion  . COLONOSCOPY    . COLONOSCOPY WITH PROPOFOL N/A 01/17/2017   Procedure: COLONOSCOPY WITH PROPOFOL;  Surgeon: Lollie Sails, MD;  Location: [facility] ENDOSCOPY;  Service: Endoscopy;  Laterality: N/A;  . WISDOM TOOTH EXTRACTION      There were no vitals filed for this visit.  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 Was able to do her exercises that night to help a little but is having increased radiation of symptoms.     Pertinent History  Patient is a pleasant 59 year old female who presents with LBP w sciatica of L side. PMH of hyperglycemia, elevated BP, arthritis, HLD, restless legs, chronic insomnia, prior vertiginous migraine, thalamic CVA, anxiety, CAD, depression, GERD, hx of cancer, IBS, lumbar disc disease (prior back surgery with fusion x2 L4-5, S1), and cervical disc disease. Her mother recently passed away with hospice. Has additionally worsening neck issues with MRI revealing disc bulge  with unsuccessful steroid taper. Per physician note pain is on left side of neck and down left shoulder. Additionally has discomfort in SI joint (left). Severe increase in low back radiating pain began a week prior to PT eval (08/18/19) with acute onset of weakness. 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    Limitations  Sitting;Lifting;Standing;Walking;House hold activities    How long can you sit comfortably?  ease off after a whie sitting but immediately painful    How long can you stand comfortably?  immediately painful and then eases off    How long can you walk comfortably?  a couple of minutes    Patient Stated Goals  pain to go away    Currently in  Pain?  Yes    Pain Score  3     Pain Location  Back    Pain Orientation  Lower    Pain Descriptors / Indicators  Aching;Radiating    Pain Type  Acute pain    Pain Onset  1 to 4 weeks ago    Pain Frequency  Intermittent          Supine:  Hamstring stretch with PT overpressure 60 second holds, leg on PT shoulder Popliteal angle lengthening LE limited 60 seconds; performed both LE SLR nerve glides with LE on PT shoulder 20x, cues for velocity of movement; performed both sides 5x5 sciatic nerve glide with patient holding behind knee Piriformis stretch ;60 second holds Cross body single knee to chest hold 60 seconds each LE   green swiss ball TrA contraction with posterior pelvic tilt 10x 3 second  holds Green ball arms crossed, hamstring curl 15x core activation Posterior pelvic tilt with TrA contraction green theraband around knees march 10x each LE   Prone: STM to lumbar paraspinals, quadratus, and inferior lat musculature with implementation of effleurage and petrissage x 14 minutes ; noticeable muscle tissue guarding and knots in lateral aspect Roller to piriformis each side x2 minutes    Pt educated throughout session about proper posture and technique with exercises. Improved exercise technique, movement at target joints, use of target muscles after min to mod verbal, visual, tactile cues   Use of heat pad to low back and neck allow for improved muscle tissue relaxation for manipulation and lengthening.    ducation on need for core activation when pushing heavier/unstabe objects                      PT Education - 09/19/19 1100    Education Details  exercise technique, manual    Person(s) Educated  Patient    Methods  Explanation;Demonstration;Tactile cues;Verbal cues    Comprehension  Verbalized understanding;Returned demonstration;Verbal cues required;Tactile cues required       PT Short Term Goals - 08/29/19 1242      PT SHORT TERM GOAL #1   Title  Patient will be independent in home exercise program to improve strength/mobility for better functional independence with ADLs.    Baseline  4/1: HEP given    Time  2    Period  Weeks    Status  New    Target Date  09/26/19        PT Long Term Goals - 08/29/19 1243      PT LONG TERM GOAL #1   Title  Patient will report a worst pain of 3/10 on VAS in low back  to improve tolerance with ADLs and reduced symptoms with activities.    Baseline  4/1: 8/10    Time  4    Period  Weeks    Status  New    Target Date  09/26/19      PT LONG TERM GOAL #2   Title  Patient will increase FOTO score to equal to or greater than  56/100   to demonstrate statistically significant improvement in mobility and  quality of life.    Baseline  4/1: 44/100    Time  4    Period  Weeks    Status  New    Target Date  09/26/19      PT LONG TERM GOAL #3   Title  Patient will increase BLE gross strength to 4+/5 as to improve  functional strength for independent gait, increased standing tolerance and increased ADL ability.    Baseline  4/1:see note    Time  4    Period  Weeks    Status  New    Target Date  09/26/19      PT LONG TERM GOAL #4   Title  Patient will tolerate sitting unsupported for 15+ minutes to demonstrate improved back extensor strength and improved sitting tolerance.    Baseline  4/1: painful to sit at work    Time  4    Period  Weeks    Status  New    Target Date  09/26/19            Plan - 09/19/19 1102    Clinical Impression Statement  Patient initially presented with increased pain, radiating symptoms, and gait dysfunction however combination of manual and therex allowed for reduction of pain level,  centralization of symptoms, and improved gait mechanics. Nerve glides continue to centralize pain and patient demonstrates ability to perform correctly.  Patient would benefit from skilled physical therapy to decrease pain and improve mobility to return patient to PLOF    Personal Factors and Comorbidities  Age;Comorbidity 3+;Past/Current Experience;Profession;Social Background    Comorbidities  hyperglycemia, elevated BP, arthritis, HLD, restless legs, chronic insomnia, prior vertiginous migraine, thalamic CVA, anxiety, CAD, depression, GERD, hx of cancer, IBS, lumbar disc disease (prior back surgery with fusion x2 L4-5, S1), and cervical disc disease.    Examination-Activity Limitations  Bathing;Bed Mobility;Bend;Caring for [employer];Locomotion Level;Lift;Stand;Toileting;Transfers    Examination-Participation Restrictions  Cleaning;Community Activity;Driving;Interpersonal Relationship;Laundry;Volunteer;Meal Prep;Yard Work;Other     Stability/Clinical Decision Making  Evolving/Moderate complexity    Rehab Potential  Fair    PT Frequency  2x / week    PT Duration  4 weeks    PT Treatment/Interventions  ADLs/Self Care Home Management;Biofeedback;Cryotherapy;Electrical Stimulation;Iontophoresis 4mg /ml Dexamethasone;Moist Heat;Traction;Ultrasound;DME Instruction;Gait training;Stair training;Functional mobility training;Therapeutic activities;Patient/family education;Neuromuscular re-education;Balance training;Therapeutic exercise;Manual techniques;Energy conservation;Dry needling;Passive range of motion;Taping;Vestibular;Vasopneumatic Device    PT Next Visit Plan  review HEP, distraction, STM, repeated motions    PT Home Exercise Plan  see above    Consulted and Agree with Plan of Care  Patient       Patient will benefit from skilled therapeutic intervention in order to improve the following deficits and impairments:  Abnormal gait, Decreased activity tolerance, Decreased coordination, Decreased mobility, Decreased range of motion, Difficulty walking, Decreased strength, Hypomobility, Impaired flexibility, Impaired perceived functional ability, Increased muscle spasms, Postural dysfunction, Improper body mechanics, Pain  Visit Diagnosis: Acute left-sided low back pain with sciatica, sciatica laterality unspecified  Other lack of coordination  Muscle weakness (generalized)     Problem List Patient Active Problem List   Diagnosis Date Noted  . PFO (patent foramen ovale)   . CAD (coronary artery disease), native coronary artery   . Acute CVA (cerebrovascular accident) ([place]) 06/15/2018  . Depression 10/16/2017  . Migraine 10/16/2017  . Hyperglycemia, unspecified 07/10/2017  . Hyperglycemia 07/10/2017  . H/O dizziness 08/02/2016  . Cervical disc disorder with radiculopathy of cervical region 04/12/2016  . Nonallopathic lesion of thoracic region 04/12/2016  . Nonallopathic lesion of rib cage 04/12/2016  . Facet  arthritis of cervical region 03/21/2016  . Hyperlipidemia 01/26/2015  . Labral tear of shoulder, right, subsequent encounter 12/04/2014  . 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 Impingement syndrome of right shoulder 05/28/2014  . Right supraspinatus tenosynovitis 05/28/2014  . Arthritis pain 04/03/2014  . Fatigue 04/03/2014  . 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  IBS (irritable bowel syndrome) 06/20/2012  . Lumbar disc disease 06/20/2012   Janna Arch, PT, DPT   09/19/2019, 11:03 AM  [place] MAIN [facility] SERVICES [address] [place], [place], [address] Phone: [phone]   Fax:  [phone]  Name: Lori Morales MRN: [id number] Date of Birth: 1961-03-21

## 2019-09-24 ENCOUNTER — Other Ambulatory Visit: Payer: Self-pay

## 2019-09-24 ENCOUNTER — Ambulatory Visit: Payer: 59

## 2019-09-24 DIAGNOSIS — M5440 Lumbago with sciatica, unspecified side: Secondary | ICD-10-CM | POA: Diagnosis not present

## 2019-09-24 DIAGNOSIS — R278 Other lack of coordination: Secondary | ICD-10-CM | POA: Diagnosis not present

## 2019-09-24 DIAGNOSIS — M6281 Muscle weakness (generalized): Secondary | ICD-10-CM | POA: Diagnosis not present

## 2019-09-24 NOTE — Therapy (Signed)
[place] MAIN [facility] SERVICES [address] [place], [place], [address] Phone: [phone]   Fax:  [phone]  Physical Therapy Treatment/RECERT  Patient Details  Name: Lori Morales MRN: [id number] Date of Birth: Dec 24, 1960 Referring Provider (PT): Tama High III   Encounter Date: 09/24/2019  PT End of Session - 09/24/19 1003    Visit Number  8    Number of Visits  16    Date for PT Re-Evaluation  10/22/19    Authorization Type  8/10 eval 08/29/19    PT Start Time  0945    PT Stop Time  1029    PT Time Calculation (min)  44 min    Activity Tolerance  Patient tolerated treatment well    Behavior During Therapy  [facility] for tasks assessed/performed       Past Medical History:  Diagnosis Date  . Arthritis   . CAD (coronary artery disease), native coronary artery    mild non calcified plaque in mid LAD and prox LCx on coronary CTA 01/2019  . Depression   . GERD (gastroesophageal reflux disease)   . Headache    migraines  . Hx of squamous cell carcinoma 07/11/2016   Right distal dorsum lat forearm   . Hypertension   . IBS (irritable bowel syndrome)   . Palpitations   . PFO (patent foramen ovale)    small by cardiac CTA 01/2019  . PONV (postoperative nausea and vomiting)   . Restless legs syndrome     Past Surgical History:  Procedure Laterality Date  . ABDOMINOPLASTY    . BACK SURGERY     1 laminectomy, 1 fusion  . COLONOSCOPY    . COLONOSCOPY WITH PROPOFOL N/A 01/17/2017   Procedure: COLONOSCOPY WITH PROPOFOL;  Surgeon: Lollie Sails, MD;  Location: [facility] ENDOSCOPY;  Service: Endoscopy;  Laterality: N/A;  . WISDOM TOOTH EXTRACTION      There were no vitals filed for this visit.  Subjective Assessment - 09/24/19 1302    Subjective  Patient reports her neck has been painful. Saturday was a day of highest pain in a week, has noticed in general however that her low back is ~50-75% improved and is much less frequent.     Pertinent History  Patient is a pleasant 59 year old female who presents with LBP w sciatica of L side. PMH of hyperglycemia, elevated BP, arthritis, HLD, restless legs, chronic insomnia, prior vertiginous migraine, thalamic CVA, anxiety, CAD, depression, GERD, hx of cancer, IBS, lumbar disc disease (prior back surgery with fusion x2 L4-5, S1), and cervical disc disease. Her mother recently passed away with hospice. Has additionally worsening neck issues with MRI revealing disc bulge  with unsuccessful steroid taper. Per physician note pain is on left side of neck and down left shoulder. Additionally has discomfort in SI joint (left). Severe increase in low back radiating pain began a week prior to PT eval (08/18/19) with acute onset of weakness. 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    Limitations  Sitting;Lifting;Standing;Walking;House hold activities    How long can you sit comfortably?  ease off after a whie sitting but immediately painful    How long can you stand comfortably?  immediately painful and then eases off    How long can you walk comfortably?  a couple of minutes    Patient Stated Goals  pain to go away  Currently in Pain?  Yes    Pain Score  2     Pain Location  Back    Pain Orientation  Lower    Pain Descriptors / Indicators  Aching    Pain Type  Acute pain    Pain Onset  1 to 4 weeks ago    Pain Frequency  Intermittent    Pain Score  5    Pain Location  Neck    Pain Orientation  Left    Pain Descriptors / Indicators  Aching;Stabbing    Pain Type  Acute pain    Pain Onset  In the past 7 days    Pain Frequency  Intermittent       Goals :   VAS: 4/10   FOTO : 49.3%  Tolerate sitting 15 minutes: able to tolerate.   BLE strength  Right Left  Hip flexion 5/5 4-/5  Hip Abduction 5/5 4/5  Hip Adduction 5/5 4/5  Knee Extension  5/5 4+/5  Knee Flexion 5/5 4+/5  DF 5/5 4/5  PF 5/5 4/5    MODI: 38%      Supine:   Piriformis stretch ;60 second holds Cross body single knee to chest hold 60 seconds each LE   green swiss ball TrA contraction with posterior pelvic tilt 10x 3 second holds Green ball arms crossed, hamstring curl 15x core activation Posterior pelvic tilt with TrA contraction green theraband around knees march 10x each LE   Prone: STM to lumbar paraspinals, quadratus, and inferior lat musculature with implementation of effleurage and petrissage x 14 minutes ; noticeable muscle tissue guarding and knots in lateral aspect Mobilizations to thoracic spine 3x 20 seconds each level, excessively hypomobile with L>R.    Patient educated on workplace set up, lumbar posturing/body alignment  Pt educated throughout session about proper posture and technique with exercises. Improved exercise technique, movement at target joints, use of target muscles after min to mod verbal, visual, tactile cues   Use of heat pad to low back and neck allow for improved muscle tissue relaxation for manipulation and lengthening.     Pain is now 1x/week bad; occasionally down to knee, patient reports 50-75% better.      Patient's pain is improving from 8/10 to 4/10 highest VAS level, FOTO improving to 49.3%. L hip strength improved with hip flexion continuing to be involved. She is now able to sit for > 15 minutes without pain increase. New goal addition of MODI performed. Pain is now 1x/week bad; occasionally down to knee, patient reports 50-75% better. Patient would benefit from skilled physical therapy to decrease pain and improve mobility to return patient to PLOF                   PT Education - 09/24/19 1004    Education Details  goals, POC    Person(s) Educated  Patient    Methods  Explanation    Comprehension  Verbalized understanding       PT Short Term Goals - 09/24/19 1306      PT SHORT TERM GOAL #1   Title  Patient will be independent in home exercise program to improve  strength/mobility for better functional independence with ADLs.    Baseline  4/1: HEP given 4/27: HEP compliant    Time  2    Period  Weeks    Status  Partially Met    Target Date  10/08/19        PT Long Term  Goals - 09/24/19 1307      PT LONG TERM GOAL #1   Title  Patient will report a worst pain of 3/10 on VAS in low back  to improve tolerance with ADLs and reduced symptoms with activities.    Baseline  4/1: 8/10 4/27: 4/10    Time  4    Period  Weeks    Status  Partially Met    Target Date  10/22/19      PT LONG TERM GOAL #2   Title  Patient will increase FOTO score to equal to or greater than  56/100   to demonstrate statistically significant improvement in mobility and quality of life.    Baseline  4/1: 44/100 4/27: 49.3%    Time  4    Period  Weeks    Status  Partially Met    Target Date  10/22/19      PT LONG TERM GOAL #3   Title  Patient will increase BLE gross strength to 4+/5 as to improve functional strength for independent gait, increased standing tolerance and increased ADL ability.    Baseline  4/1:see note 4/27: see note, improved by one point    Time  4    Period  Weeks    Status  Partially Met    Target Date  10/22/19      PT LONG TERM GOAL #4   Title  Patient will tolerate sitting unsupported for 15+ minutes to demonstrate improved back extensor strength and improved sitting tolerance.    Baseline  4/1: painful to sit at work 4/27: able to sit for > 15 minutes    Time  4    Period  Weeks    Status  Achieved      PT LONG TERM GOAL #5   Title  Patient will reduce modified Oswestry score to <20 as to demonstrate minimal disability with ADLs including improved sleeping tolerance, walking/sitting tolerance etc for better mobility with ADLs.    Baseline  4/27: 38%    Time  4    Period  Weeks    Status  New    Target Date  10/22/19            Plan - 09/24/19 1311    Clinical Impression Statement  Patient's pain is improving from 8/10 to 4/10  highest VAS level, FOTO improving to 49.3%. L hip strength improved with hip flexion continuing to be involved. She is now able to sit for > 15 minutes without pain increase. New goal addition of MODI performed. Pain is now 1x/week bad; occasionally down to knee, patient reports 50-75% better. Patient would benefit from skilled physical therapy to decrease pain and improve mobility to return patient to PLOF    Personal Factors and Comorbidities  Age;Comorbidity 3+;Past/Current Experience;Profession;Social Background    Comorbidities  hyperglycemia, elevated BP, arthritis, HLD, restless legs, chronic insomnia, prior vertiginous migraine, thalamic CVA, anxiety, CAD, depression, GERD, hx of cancer, IBS, lumbar disc disease (prior back surgery with fusion x2 L4-5, S1), and cervical disc disease.    Examination-Activity Limitations  Bathing;Bed Mobility;Bend;Caring for [employer];Locomotion Level;Lift;Stand;Toileting;Transfers    Examination-Participation Restrictions  Cleaning;Community Activity;Driving;Interpersonal Relationship;Laundry;Volunteer;Meal Prep;Yard Work;Other    Stability/Clinical Decision Making  Evolving/Moderate complexity    Rehab Potential  Fair    PT Frequency  2x / week    PT Duration  4 weeks    PT Treatment/Interventions  ADLs/Self Care Home Management;Biofeedback;Cryotherapy;Electrical Stimulation;Iontophoresis 83m/ml Dexamethasone;Moist Heat;Traction;Ultrasound;DME Instruction;Gait training;Stair training;Functional mobility training;Therapeutic activities;Patient/family  education;Neuromuscular re-education;Balance training;Therapeutic exercise;Manual techniques;Energy conservation;Dry needling;Passive range of motion;Taping;Vestibular;Vasopneumatic Device    PT Next Visit Plan  review HEP, distraction, STM, repeated motions    PT Home Exercise Plan  see above    Consulted and Agree with Plan of Care  Patient       Patient will  benefit from skilled therapeutic intervention in order to improve the following deficits and impairments:  Abnormal gait, Decreased activity tolerance, Decreased coordination, Decreased mobility, Decreased range of motion, Difficulty walking, Decreased strength, Hypomobility, Impaired flexibility, Impaired perceived functional ability, Increased muscle spasms, Postural dysfunction, Improper body mechanics, Pain  Visit Diagnosis: Acute left-sided low back pain with sciatica, sciatica laterality unspecified  Other lack of coordination  Muscle weakness (generalized)     Problem List Patient Active Problem List   Diagnosis Date Noted  . PFO (patent foramen ovale)   . CAD (coronary artery disease), native coronary artery   . Acute CVA (cerebrovascular accident) ([place]) 06/15/2018  . Depression 10/16/2017  . Migraine 10/16/2017  . Hyperglycemia, unspecified 07/10/2017  . Hyperglycemia 07/10/2017  . H/O dizziness 08/02/2016  . Cervical disc disorder with radiculopathy of cervical region 04/12/2016  . Nonallopathic lesion of thoracic region 04/12/2016  . Nonallopathic lesion of rib cage 04/12/2016  . Facet arthritis of cervical region 03/21/2016  . Hyperlipidemia 01/26/2015  . Labral tear of shoulder, right, subsequent encounter 12/04/2014  . 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 Impingement syndrome of right shoulder 05/28/2014  . Right supraspinatus tenosynovitis 05/28/2014  . Arthritis pain 04/03/2014  . Fatigue 04/03/2014  . 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 IBS (irritable bowel syndrome) 06/20/2012  . Lumbar disc disease 06/20/2012   Janna Arch, PT, DPT   09/24/2019, 1:12 PM  [place] MAIN [facility] SERVICES [address] [place], [place], [address] Phone: [phone]   Fax:  [phone]  Name: JOLIE STROHECKER MRN: [id number] Date of Birth: September 21, 1960

## 2019-09-25 ENCOUNTER — Other Ambulatory Visit: Payer: Self-pay | Admitting: Internal Medicine

## 2019-09-26 ENCOUNTER — Ambulatory Visit: Payer: 59

## 2019-09-26 ENCOUNTER — Other Ambulatory Visit: Payer: Self-pay

## 2019-09-26 DIAGNOSIS — M6281 Muscle weakness (generalized): Secondary | ICD-10-CM | POA: Diagnosis not present

## 2019-09-26 DIAGNOSIS — M5440 Lumbago with sciatica, unspecified side: Secondary | ICD-10-CM | POA: Diagnosis not present

## 2019-09-26 DIAGNOSIS — R278 Other lack of coordination: Secondary | ICD-10-CM

## 2019-09-26 NOTE — Therapy (Signed)
[place] MAIN [facility] SERVICES [address] [place], [place], [address] Phone: [phone]   Fax:  [phone]  Physical Therapy Treatment  Patient Details  Name: Lori Morales MRN: [id number] Date of Birth: January 11, 1961 Referring Provider (PT): Tama High III   Encounter Date: 09/26/2019  PT End of Session - 09/26/19 1238    Visit Number  9    Number of Visits  16    Date for PT Re-Evaluation  10/22/19    Authorization Type  9/10 eval 08/29/19    PT Start Time  0945    PT Stop Time  1026    PT Time Calculation (min)  41 min    Activity Tolerance  Patient tolerated treatment well    Behavior During Therapy  [facility] for tasks assessed/performed       Past Medical History:  Diagnosis Date  . Arthritis   . CAD (coronary artery disease), native coronary artery    mild non calcified plaque in mid LAD and prox LCx on coronary CTA 01/2019  . Depression   . GERD (gastroesophageal reflux disease)   . Headache    migraines  . Hx of squamous cell carcinoma 07/11/2016   Right distal dorsum lat forearm   . Hypertension   . IBS (irritable bowel syndrome)   . Palpitations   . PFO (patent foramen ovale)    small by cardiac CTA 01/2019  . PONV (postoperative nausea and vomiting)   . Restless legs syndrome     Past Surgical History:  Procedure Laterality Date  . ABDOMINOPLASTY    . BACK SURGERY     1 laminectomy, 1 fusion  . COLONOSCOPY    . COLONOSCOPY WITH PROPOFOL N/A 01/17/2017   Procedure: COLONOSCOPY WITH PROPOFOL;  Surgeon: Lollie Sails, MD;  Location: [facility] ENDOSCOPY;  Service: Endoscopy;  Laterality: N/A;  . WISDOM TOOTH EXTRACTION      There were no vitals filed for this visit.  Subjective Assessment - 09/26/19 1236    Subjective  Patient reports she started prednisone due to her neck pain. Her back pain is centralizing.    Pertinent History  Patient is a pleasant 59 year old female who presents with LBP w sciatica of L side.  PMH of hyperglycemia, elevated BP, arthritis, HLD, restless legs, chronic insomnia, prior vertiginous migraine, thalamic CVA, anxiety, CAD, depression, GERD, hx of cancer, IBS, lumbar disc disease (prior back surgery with fusion x2 L4-5, S1), and cervical disc disease. Her mother recently passed away with hospice. Has additionally worsening neck issues with MRI revealing disc bulge  with unsuccessful steroid taper. Per physician note pain is on left side of neck and down left shoulder. Additionally has discomfort in SI joint (left). Severe increase in low back radiating pain began a week prior to PT eval (08/18/19) with acute onset of weakness. 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    Limitations  Sitting;Lifting;Standing;Walking;House hold activities    How long can you sit comfortably?  ease off after a whie sitting but immediately painful    How long can you stand comfortably?  immediately painful and then eases off    How long can you walk comfortably?  a couple of minutes    Patient Stated Goals  pain to go away    Currently in Pain?  Yes    Pain Score  2     Pain Location  Back    Pain Orientation  Lower    Pain Descriptors / Indicators  Aching    Pain Type  Acute pain    Pain Onset  1 to 4 weeks ago    Pain Frequency  Intermittent    Pain Score  4    Pain Location  Neck    Pain Orientation  Left    Pain Descriptors / Indicators  Aching    Pain Type  Acute pain    Pain Onset  In the past 7 days    Pain Frequency  Intermittent             Supine:  Hamstring stretch with PT overpressure 60 second holds, leg on PT shoulder Popliteal angle lengthening LE limited 60 seconds; performed both LE SLR nerve glides with LE on PT shoulder 20x, cues for velocity of movement; performed both sides Piriformis stretch ;60 second holds Cross body single knee to chest hold 60 seconds each LE     Prone: STM to lumbar paraspinals,  quadratus, and inferior lat musculature with implementation of effleurage and petrissage x 14 minutes ; noticeable muscle tissue guarding and knots in lateral aspect Mobilizations to thoracic spine 3x 20 seconds each level, excessively hypomobile with L>R.   Seated on swiss ball: -static stability with core activation, posterior pelvic tilts 10x -Heel raises 15x each LE, cues for posterior pelvic tilt and breathing -single limb marching 15x each LE, cues for exhale,  Core activation  Seated: Forward swiss ball rollout 10x 10 second holds, lateral/diagonal rollout 10x   Pt educated throughout session about proper posture and technique with exercises. Improved exercise technique, movement at target joints, use of target muscles after min to mod verbal, visual, tactile cues                        PT Education - 09/26/19 1238    Education Details  exercise technique, body  mechanics    Person(s) Educated  Patient    Methods  Explanation;Demonstration;Tactile cues;Verbal cues    Comprehension  Verbalized understanding;Returned demonstration;Verbal cues required;Tactile cues required       PT Short Term Goals - 09/24/19 1306      PT SHORT TERM GOAL #1   Title  Patient will be independent in home exercise program to improve strength/mobility for better functional independence with ADLs.    Baseline  4/1: HEP given 4/27: HEP compliant    Time  2    Period  Weeks    Status  Partially Met    Target Date  10/08/19        PT Long Term Goals - 09/24/19 1307      PT LONG TERM GOAL #1   Title  Patient will report a worst pain of 3/10 on VAS in low back  to improve tolerance with ADLs and reduced symptoms with activities.    Baseline  4/1: 8/10 4/27: 4/10    Time  4    Period  Weeks    Status  Partially Met    Target Date  10/22/19      PT LONG TERM GOAL #2   Title  Patient will increase FOTO score to equal to or greater than  56/100   to demonstrate statistically  significant improvement in mobility and quality of life.    Baseline  4/1: 44/100 4/27: 49.3%    Time  4    Period  Weeks    Status  Partially Met    Target Date  10/22/19      PT LONG TERM GOAL #3   Title  Patient will increase BLE gross strength to 4+/5 as to improve functional strength for independent gait, increased standing tolerance and increased ADL ability.    Baseline  4/1:see note 4/27: see note, improved by one point    Time  4    Period  Weeks    Status  Partially Met    Target Date  10/22/19      PT LONG TERM GOAL #4   Title  Patient will tolerate sitting unsupported for 15+ minutes to demonstrate improved back extensor strength and improved sitting tolerance.    Baseline  4/1: painful to sit at work 4/27: able to sit for > 15 minutes    Time  4    Period  Weeks    Status  Achieved      PT LONG TERM GOAL #5   Title  Patient will reduce modified Oswestry score to <20 as to demonstrate minimal disability with ADLs including improved sleeping tolerance, walking/sitting tolerance etc for better mobility with ADLs.    Baseline  4/27: 38%    Time  4    Period  Weeks    Status  New    Target Date  10/22/19            Plan - 09/26/19 1240    Clinical Impression Statement  Patient is progressing with functional core stability and muscle tissue lengthening interventions. Unstable core activation is challenging but patient improved with repetition. Nerve glides continue to centralize pain and patient demonstrates ability to perform correctly. Patient would benefit from skilled physical therapy to decrease pain and improve mobility to return patient to PLOF    Personal Factors and Comorbidities  Age;Comorbidity 3+;Past/Current Experience;Profession;Social Background    Comorbidities  hyperglycemia, elevated BP, arthritis, HLD, restless legs, chronic insomnia, prior vertiginous migraine, thalamic CVA, anxiety, CAD, depression, GERD, hx of cancer, IBS, lumbar disc disease  (prior back surgery with fusion x2 L4-5, S1), and cervical disc disease.    Examination-Activity Limitations  Bathing;Bed Mobility;Bend;Caring for [employer];Locomotion Level;Lift;Stand;Toileting;Transfers    Examination-Participation Restrictions  Cleaning;Community Activity;Driving;Interpersonal Relationship;Laundry;Volunteer;Meal Prep;Yard Work;Other    Stability/Clinical Decision Making  Evolving/Moderate complexity    Rehab Potential  Fair    PT Frequency  2x / week    PT Duration  4 weeks    PT Treatment/Interventions  ADLs/Self Care Home Management;Biofeedback;Cryotherapy;Electrical Stimulation;Iontophoresis 73m/ml Dexamethasone;Moist Heat;Traction;Ultrasound;DME Instruction;Gait training;Stair training;Functional mobility training;Therapeutic activities;Patient/family education;Neuromuscular re-education;Balance training;Therapeutic exercise;Manual techniques;Energy conservation;Dry needling;Passive range of motion;Taping;Vestibular;Vasopneumatic Device    PT Next Visit Plan  review HEP, distraction, STM, repeated motions    PT Home Exercise Plan  see above    Consulted and Agree with Plan of Care  Patient       Patient will benefit from skilled therapeutic intervention in order to improve the following deficits and impairments:  Abnormal gait, Decreased activity tolerance, Decreased coordination, Decreased mobility, Decreased range of motion, Difficulty walking, Decreased strength, Hypomobility, Impaired flexibility, Impaired perceived functional ability, Increased muscle spasms, Postural dysfunction, Improper body mechanics, Pain  Visit Diagnosis: Acute left-sided low back pain with sciatica, sciatica laterality unspecified  Other lack of coordination  Muscle weakness (generalized)     Problem List Patient Active Problem List   Diagnosis Date Noted  . PFO (patent foramen ovale)   . CAD (coronary artery disease), native coronary  artery   . [place]  .  Depression 10/16/2017  . Migraine 10/16/2017  . Hyperglycemia, unspecified 07/10/2017  . Hyperglycemia 07/10/2017  . H/O dizziness 08/02/2016  . Cervical disc disorder with radiculopathy of cervical region 04/12/2016  . Nonallopathic lesion of thoracic region 04/12/2016  . Nonallopathic lesion of rib cage 04/12/2016  . Facet arthritis of cervical region 03/21/2016  . Hyperlipidemia 01/26/2015  . Labral tear of shoulder, right, subsequent encounter 12/04/2014  . 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 Impingement syndrome of right shoulder 05/28/2014  . Right supraspinatus tenosynovitis 05/28/2014  . Arthritis pain 04/03/2014  . Fatigue 04/03/2014  . 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 IBS (irritable bowel syndrome) 06/20/2012  . Lumbar disc disease 06/20/2012   Janna Arch, PT, DPT   09/26/2019, 12:41 PM  [place] MAIN [facility] SERVICES [address] [place], [place], [address] Phone: [phone]   Fax:  [phone]  Name: KEIGHLEY DECKMAN MRN: [id number] Date of Birth: November 15, 1960

## 2019-10-01 ENCOUNTER — Other Ambulatory Visit: Payer: Self-pay

## 2019-10-01 ENCOUNTER — Ambulatory Visit: Payer: 59 | Attending: Internal Medicine

## 2019-10-01 DIAGNOSIS — R471 Dysarthria and anarthria: Secondary | ICD-10-CM | POA: Insufficient documentation

## 2019-10-01 DIAGNOSIS — M5440 Lumbago with sciatica, unspecified side: Secondary | ICD-10-CM | POA: Diagnosis not present

## 2019-10-01 DIAGNOSIS — R278 Other lack of coordination: Secondary | ICD-10-CM | POA: Insufficient documentation

## 2019-10-01 DIAGNOSIS — R293 Abnormal posture: Secondary | ICD-10-CM | POA: Diagnosis not present

## 2019-10-01 DIAGNOSIS — M542 Cervicalgia: Secondary | ICD-10-CM | POA: Insufficient documentation

## 2019-10-01 DIAGNOSIS — M6281 Muscle weakness (generalized): Secondary | ICD-10-CM | POA: Diagnosis not present

## 2019-10-01 NOTE — Therapy (Signed)
[place] MAIN [facility] SERVICES [address] [place], [place], [address] Phone: [phone]   Fax:  [phone]  Physical Therapy Treatment Physical Therapy Progress Note   Dates of reporting period  08/29/19   to   10/01/19   Patient Details  Name: GISSELL BARRA MRN: [id number] Date of Birth: 1960/11/19 Referring Provider (PT): Tama High III   Encounter Date: 10/01/2019  PT End of Session - 10/01/19 1017    Visit Number  10    Number of Visits  16    Date for PT Re-Evaluation  10/22/19    Authorization Type  10/10 eval 08/29/19    PT Start Time  0847    PT Stop Time  0931    PT Time Calculation (min)  44 min    Activity Tolerance  Patient tolerated treatment well    Behavior During Therapy  [facility] for tasks assessed/performed       Past Medical History:  Diagnosis Date  . Arthritis   . CAD (coronary artery disease), native coronary artery    mild non calcified plaque in mid LAD and prox LCx on coronary CTA 01/2019  . Depression   . GERD (gastroesophageal reflux disease)   . Headache    migraines  . Hx of squamous cell carcinoma 07/11/2016   Right distal dorsum lat forearm   . Hypertension   . IBS (irritable bowel syndrome)   . Palpitations   . PFO (patent foramen ovale)    small by cardiac CTA 01/2019  . PONV (postoperative nausea and vomiting)   . Restless legs syndrome     Past Surgical History:  Procedure Laterality Date  . ABDOMINOPLASTY    . BACK SURGERY     1 laminectomy, 1 fusion  . COLONOSCOPY    . COLONOSCOPY WITH PROPOFOL N/A 01/17/2017   Procedure: COLONOSCOPY WITH PROPOFOL;  Surgeon: Lollie Sails, MD;  Location: [facility] ENDOSCOPY;  Service: Endoscopy;  Laterality: N/A;  . WISDOM TOOTH EXTRACTION      There were no vitals filed for this visit.  Subjective Assessment - 10/01/19 1014    Subjective  Patient reports her pain in her back is centralizing, no falls or LOB since last session. Is going to [place] this  weekend.    Pertinent History  Patient is a pleasant 59 year old female who presents with LBP w sciatica of L side. PMH of hyperglycemia, elevated BP, arthritis, HLD, restless legs, chronic insomnia, prior vertiginous migraine, thalamic CVA, anxiety, CAD, depression, GERD, hx of cancer, IBS, lumbar disc disease (prior back surgery with fusion x2 L4-5, S1), and cervical disc disease. Her mother recently passed away with hospice. Has additionally worsening neck issues with MRI revealing disc bulge  with unsuccessful steroid taper. Per physician note pain is on left side of neck and down left shoulder. Additionally has discomfort in SI joint (left). Severe increase in low back radiating pain began a week prior to PT eval (08/18/19) with acute onset of weakness. 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    Limitations  Sitting;Lifting;Standing;Walking;House hold activities    How long can you sit comfortably?  ease off after a whie sitting but immediately painful    How long can you stand comfortably?  immediately painful and then eases off    How long can you walk comfortably?  a couple of minutes    Patient Stated Goals  pain to go away    Currently in Pain?  Yes    Pain Score  2     Pain Location  Back    Pain Orientation  Lower    Pain Descriptors / Indicators  Aching    Pain Type  Acute pain    Pain Onset  1 to 4 weeks ago    Pain Frequency  Intermittent            Treatment:     Supine:  Hamstring stretch with PT overpressure 60 second holds, leg on PT shoulder Popliteal angle lengthening LE limited 60 seconds; performed both LE SLR nerve glides with LE on PT shoulder 20x, cues for velocity of movement; performed both sides Nerve glides 5x5 with towel assistance each LE Piriformis stretch ;60 second holds Cross body single knee to chest hold 60 seconds each LE   posterior pelvic tilt with RTB abduction 15x    Prone: STM to  lumbar paraspinals, quadratus, and inferior lat musculature with implementation of effleurage and petrissage x 14 minutes ; noticeable muscle tissue guarding and knots in lateral aspect Mobilizations to thoracic spine 3x 20 seconds each level, excessively hypomobile with L>R.    Quadruped: Child pose 20-30 second holds x 3 trials, child pose laterally with L/R bias 30 second holds each direction   Pt educated throughout session about proper posture and technique with exercises. Improved exercise technique, movement at target joints, use of target muscles after min to mod verbal, visual, tactile cues   Patient's condition has the potential to improve in response to therapy. Maximum improvement is yet to be obtained. The anticipated improvement is attainable and reasonable in a generally predictable time.  Patient reports her pain is getting better and centralizing. She still experiences some LLE weakness but is noticing an improvement.   Patient's goals performed two sessions prior on 09/24/19, please refer to this note for details on progress. Patient continues to progress with centralization of symptoms with decreased episodes of flare ups.  Patient's condition has the potential to improve in response to therapy. Maximum improvement is yet to be obtained. The anticipated improvement is attainable and reasonable in a generally predictable time.                PT Education - 10/01/19 1016    Education Details  exercise technique, body mechanics    Person(s) Educated  Patient    Methods  Explanation;Demonstration;Tactile cues;Verbal cues    Comprehension  Verbalized understanding;Returned demonstration;Verbal cues required;Tactile cues required       PT Short Term Goals - 09/24/19 1306      PT SHORT TERM GOAL #1   Title  Patient will be independent in home exercise program to improve strength/mobility for better functional independence with ADLs.    Baseline  4/1: HEP given 4/27:  HEP compliant    Time  2    Period  Weeks    Status  Partially Met    Target Date  10/08/19        PT Long Term Goals - 09/24/19 1307      PT LONG TERM GOAL #1   Title  Patient will report a worst pain of 3/10 on VAS in low back  to improve tolerance with ADLs and reduced symptoms with activities.    Baseline  4/1: 8/10 4/27: 4/10    Time  4    Period  Weeks    Status  Partially Met    Target  Date  10/22/19      PT LONG TERM GOAL #2   Title  Patient will increase FOTO score to equal to or greater than  56/100   to demonstrate statistically significant improvement in mobility and quality of life.    Baseline  4/1: 44/100 4/27: 49.3%    Time  4    Period  Weeks    Status  Partially Met    Target Date  10/22/19      PT LONG TERM GOAL #3   Title  Patient will increase BLE gross strength to 4+/5 as to improve functional strength for independent gait, increased standing tolerance and increased ADL ability.    Baseline  4/1:see note 4/27: see note, improved by one point    Time  4    Period  Weeks    Status  Partially Met    Target Date  10/22/19      PT LONG TERM GOAL #4   Title  Patient will tolerate sitting unsupported for 15+ minutes to demonstrate improved back extensor strength and improved sitting tolerance.    Baseline  4/1: painful to sit at work 4/27: able to sit for > 15 minutes    Time  4    Period  Weeks    Status  Achieved      PT LONG TERM GOAL #5   Title  Patient will reduce modified Oswestry score to <20 as to demonstrate minimal disability with ADLs including improved sleeping tolerance, walking/sitting tolerance etc for better mobility with ADLs.    Baseline  4/27: 38%    Time  4    Period  Weeks    Status  New    Target Date  10/22/19            Plan - 10/01/19 1026    Clinical Impression Statement  Patient's goals performed two sessions prior on 09/24/19, please refer to this note for details on progress. Patient continues to progress with  centralization of symptoms with decreased episodes of flare ups.  Patient's condition has the potential to improve in response to therapy. Maximum improvement is yet to be obtained. The anticipated improvement is attainable and reasonable in a generally predictable time.    Personal Factors and Comorbidities  Age;Comorbidity 3+;Past/Current Experience;Profession;Social Background    Comorbidities  hyperglycemia, elevated BP, arthritis, HLD, restless legs, chronic insomnia, prior vertiginous migraine, thalamic CVA, anxiety, CAD, depression, GERD, hx of cancer, IBS, lumbar disc disease (prior back surgery with fusion x2 L4-5, S1), and cervical disc disease.    Examination-Activity Limitations  Bathing;Bed Mobility;Bend;Caring for [employer];Locomotion Level;Lift;Stand;Toileting;Transfers    Examination-Participation Restrictions  Cleaning;Community Activity;Driving;Interpersonal Relationship;Laundry;Volunteer;Meal Prep;Yard Work;Other    Stability/Clinical Decision Making  Evolving/Moderate complexity    Rehab Potential  Fair    PT Frequency  2x / week    PT Duration  4 weeks    PT Treatment/Interventions  ADLs/Self Care Home Management;Biofeedback;Cryotherapy;Electrical Stimulation;Iontophoresis 79m/ml Dexamethasone;Moist Heat;Traction;Ultrasound;DME Instruction;Gait training;Stair training;Functional mobility training;Therapeutic activities;Patient/family education;Neuromuscular re-education;Balance training;Therapeutic exercise;Manual techniques;Energy conservation;Dry needling;Passive range of motion;Taping;Vestibular;Vasopneumatic Device    PT Next Visit Plan  review HEP, distraction, STM, repeated motions    PT Home Exercise Plan  see above    Consulted and Agree with Plan of Care  Patient       Patient will benefit from skilled therapeutic intervention in order to improve the following deficits and impairments:  Abnormal gait, Decreased activity  tolerance, Decreased coordination, Decreased mobility, Decreased range of motion, Difficulty walking, Decreased strength,  Hypomobility, Impaired flexibility, Impaired perceived functional ability, Increased muscle spasms, Postural dysfunction, Improper body mechanics, Pain  Visit Diagnosis: Acute left-sided low back pain with sciatica, sciatica laterality unspecified  Other lack of coordination  Muscle weakness (generalized)     Problem List Patient Active Problem List   Diagnosis Date Noted  . PFO (patent foramen ovale)   . CAD (coronary artery disease), native coronary artery   . Acute CVA (cerebrovascular accident) ([place]) 06/15/2018  . Depression 10/16/2017  . Migraine 10/16/2017  . Hyperglycemia, unspecified 07/10/2017  . Hyperglycemia 07/10/2017  . H/O dizziness 08/02/2016  . Cervical disc disorder with radiculopathy of cervical region 04/12/2016  . Nonallopathic lesion of thoracic region 04/12/2016  . Nonallopathic lesion of rib cage 04/12/2016  . Facet arthritis of cervical region 03/21/2016  . Hyperlipidemia 01/26/2015  . Labral tear of shoulder, right, subsequent encounter 12/04/2014  . 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 Impingement syndrome of right shoulder 05/28/2014  . Right supraspinatus tenosynovitis 05/28/2014  . Arthritis pain 04/03/2014  . Fatigue 04/03/2014  . 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 IBS (irritable bowel syndrome) 06/20/2012  . Lumbar disc disease 06/20/2012   Janna Arch, PT, DPT   10/01/2019, 10:27 AM  [place] MAIN [facility] SERVICES [address] [place], [place], [address] Phone: [phone]   Fax:  [phone]  Name: RHODA WALDVOGEL MRN: [id number] Date of Birth: 06-21-1960

## 2019-10-03 ENCOUNTER — Ambulatory Visit: Payer: 59

## 2019-10-03 ENCOUNTER — Other Ambulatory Visit: Payer: Self-pay

## 2019-10-03 DIAGNOSIS — M6281 Muscle weakness (generalized): Secondary | ICD-10-CM | POA: Diagnosis not present

## 2019-10-03 DIAGNOSIS — R471 Dysarthria and anarthria: Secondary | ICD-10-CM | POA: Diagnosis not present

## 2019-10-03 DIAGNOSIS — R278 Other lack of coordination: Secondary | ICD-10-CM | POA: Diagnosis not present

## 2019-10-03 DIAGNOSIS — R293 Abnormal posture: Secondary | ICD-10-CM | POA: Diagnosis not present

## 2019-10-03 DIAGNOSIS — M542 Cervicalgia: Secondary | ICD-10-CM | POA: Diagnosis not present

## 2019-10-03 DIAGNOSIS — M5440 Lumbago with sciatica, unspecified side: Secondary | ICD-10-CM | POA: Diagnosis not present

## 2019-10-03 NOTE — Therapy (Signed)
[place] MAIN [facility] SERVICES [address] [place], [place], [address] Phone: [phone]   Fax:  [phone]  Physical Therapy Treatment  Patient Details  Name: Lori Morales MRN: [id number] Date of Birth: 01/22/1961 Referring Provider (PT): Tama High III   Encounter Date: 10/03/2019  PT End of Session - 10/03/19 1711    Visit Number  11    Number of Visits  16    Date for PT Re-Evaluation  10/22/19    Authorization Type  1/10 PN 5/4    PT Start Time  1016    PT Stop Time  1100    PT Time Calculation (min)  44 min    Activity Tolerance  Patient tolerated treatment well    Behavior During Therapy  [facility] for tasks assessed/performed       Past Medical History:  Diagnosis Date  . Arthritis   . CAD (coronary artery disease), native coronary artery    mild non calcified plaque in mid LAD and prox LCx on coronary CTA 01/2019  . Depression   . GERD (gastroesophageal reflux disease)   . Headache    migraines  . Hx of squamous cell carcinoma 07/11/2016   Right distal dorsum lat forearm   . Hypertension   . IBS (irritable bowel syndrome)   . Palpitations   . PFO (patent foramen ovale)    small by cardiac CTA 01/2019  . PONV (postoperative nausea and vomiting)   . Restless legs syndrome     Past Surgical History:  Procedure Laterality Date  . ABDOMINOPLASTY    . BACK SURGERY     1 laminectomy, 1 fusion  . COLONOSCOPY    . COLONOSCOPY WITH PROPOFOL N/A 01/17/2017   Procedure: COLONOSCOPY WITH PROPOFOL;  Surgeon: Lollie Sails, MD;  Location: [facility] ENDOSCOPY;  Service: Endoscopy;  Laterality: N/A;  . WISDOM TOOTH EXTRACTION      There were no vitals filed for this visit.  Subjective Assessment - 10/03/19 1710    Subjective  Patient reports her back is centralizing, is heading to [employer]. Has been compliant with HEP, woke up this morning with pain did her nerve glides and pain reduced.    Pertinent History  Patient is  a pleasant 59 year old female who presents with LBP w sciatica of L side. PMH of hyperglycemia, elevated BP, arthritis, HLD, restless legs, chronic insomnia, prior vertiginous migraine, thalamic CVA, anxiety, CAD, depression, GERD, hx of cancer, IBS, lumbar disc disease (prior back surgery with fusion x2 L4-5, S1), and cervical disc disease. Her mother recently passed away with hospice. Has additionally worsening neck issues with MRI revealing disc bulge  with unsuccessful steroid taper. Per physician note pain is on left side of neck and down left shoulder. Additionally has discomfort in SI joint (left). Severe increase in low back radiating pain began a week prior to PT eval (08/18/19) with acute onset of weakness. 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    Limitations  Sitting;Lifting;Standing;Walking;House hold activities    How long can you sit comfortably?  ease off after a whie sitting but immediately painful    How long can you stand comfortably?  immediately painful and then eases off    How long can you walk comfortably?  a couple of minutes    Patient Stated Goals  pain to go away    Currently in Pain?  Yes  Pain Score  1     Pain Location  Back    Pain Orientation  Lower    Pain Descriptors / Indicators  Aching    Pain Type  Acute pain    Pain Onset  1 to 4 weeks ago    Pain Frequency  Intermittent    Pain Score  5    Pain Location  Neck    Pain Orientation  Left    Pain Descriptors / Indicators  Aching;Stabbing    Pain Type  Acute pain    Pain Onset  1 to 4 weeks ago    Pain Frequency  Intermittent          Access Code: [email or web address] URL: [email or web address] Date: 10/03/2019 Prepared by: Marina Moser  Exercises  . Supine Posterior Pelvic Tilt - 1 x daily - 7 x weekly - 10 reps - 2 sets - 5 hold . Supine Transversus Abdominis Bracing - Hands on Stomach - 1 x daily - 7 x weekly - 10 reps - 2 sets - 5  hold . Supine March with Posterior Pelvic Tilt - 1 x daily - 7 x weekly - 10 reps - 2 sets - 5 hold . Supine Hamstring Stretch with Strap - 1 x daily - 7 x weekly - 2 sets - 2 reps - 60 hold . Supine Figure 4 Piriformis Stretch - 1 x daily - 7 x weekly - 2 sets - 2 reps - 60 hold . Supine Single Knee to Chest Stretch - 1 x daily - 7 x weekly - 2 sets - 2 reps - 60 hold . Supine Sciatic Nerve Glide - 1 x daily - 7 x weekly - 10 reps - 2 sets - 5 hold . Seated Hamstring Stretch - 1 x daily - 7 x weekly - 2 sets - 2 reps - 60 hold . Seated Piriformis Stretch with Trunk Bend - 1 x daily - 7 x weekly - 2 sets - 2 reps - 60 hold . Seated Flexion Stretch with Swiss Ball - 1 x daily - 7 x weekly - 10 reps - 2 sets - 5 hold . Seated Thoracic Lumbar Extension with Pectoralis Stretch - 1 x daily - 7 x weekly - 10 reps - 2 sets - 5 hold  Seated Sciatic Tensioner - 1 x daily - 7 x weekly - 10 reps - 2 sets - 5 hold    Prone: STM to lumbar paraspinals, quadratus, and inferior lat musculature with implementation of effleurage and petrissage x14minutes ; noticeable muscle tissue guarding and knots in lateral aspect Mobilizations to thoracic spine 3x 20 seconds each level, excessively hypomobile with L>R   Pt educated throughout session about proper posture and technique with exercises. Improved exercise technique, movement at target joints, use of target muscles after min to mod verbal, visual, tactile cues                 PT Education - 10/03/19 1711    Education Details  HEP for trip    Person(s) Educated  Patient    Methods  Explanation;Demonstration;Tactile cues;Verbal cues;Handout    Comprehension  Verbalized understanding;Returned demonstration;Verbal cues required;Tactile cues required       PT Short Term Goals - 09/24/19 1306      PT SHORT TERM GOAL #1   Title  Patient will be independent in home exercise program to improve strength/mobility for better functional independence  with ADLs.    Baseline  4/1: HEP given 4/27: HEP   compliant    Time  2    Period  Weeks    Status  Partially Met    Target Date  10/08/19        PT Long Term Goals - 09/24/19 1307      PT LONG TERM GOAL #1   Title  Patient will report a worst pain of 3/10 on VAS in low back  to improve tolerance with ADLs and reduced symptoms with activities.    Baseline  4/1: 8/10 4/27: 4/10    Time  4    Period  Weeks    Status  Partially Met    Target Date  10/22/19      PT LONG TERM GOAL #2   Title  Patient will increase FOTO score to equal to or greater than  56/100   to demonstrate statistically significant improvement in mobility and quality of life.    Baseline  4/1: 44/100 4/27: 49.3%    Time  4    Period  Weeks    Status  Partially Met    Target Date  10/22/19      PT LONG TERM GOAL #3   Title  Patient will increase BLE gross strength to 4+/5 as to improve functional strength for independent gait, increased standing tolerance and increased ADL ability.    Baseline  4/1:see note 4/27: see note, improved by one point    Time  4    Period  Weeks    Status  Partially Met    Target Date  10/22/19      PT LONG TERM GOAL #4   Title  Patient will tolerate sitting unsupported for 15+ minutes to demonstrate improved back extensor strength and improved sitting tolerance.    Baseline  4/1: painful to sit at work 4/27: able to sit for > 15 minutes    Time  4    Period  Weeks    Status  Achieved      PT LONG TERM GOAL #5   Title  Patient will reduce modified Oswestry score to <20 as to demonstrate minimal disability with ADLs including improved sleeping tolerance, walking/sitting tolerance etc for better mobility with ADLs.    Baseline  4/27: 38%    Time  4    Period  Weeks    Status  New    Target Date  10/22/19            Plan - 10/03/19 1712    Clinical Impression Statement  Patient is leaving for [place] tomorrow, educated on HEP while on vacation for continued progression of  back. If patient continues on current progression with centralization she will be nearing discharge upon return. Will re-assess upon return to determine status of back with home program. Patient would benefit from skilled physical therapy to decrease pain and improve mobility to return patient to PLOF    Personal Factors and Comorbidities  Age;Comorbidity 3+;Past/Current Experience;Profession;Social Background    Comorbidities  hyperglycemia, elevated BP, arthritis, HLD, restless legs, chronic insomnia, prior vertiginous migraine, thalamic CVA, anxiety, CAD, depression, GERD, hx of cancer, IBS, lumbar disc disease (prior back surgery with fusion x2 L4-5, S1), and cervical disc disease.    Examination-Activity Limitations  Bathing;Bed Mobility;Bend;Caring for Others;Carry;Dressing;Stairs;Squat;Sleep;Sit;Reach Overhead;Locomotion Level;Lift;Stand;Toileting;Transfers    Examination-Participation Restrictions  Cleaning;Community Activity;Driving;Interpersonal Relationship;Laundry;Volunteer;Meal Prep;Yard Work;Other    Stability/Clinical Decision Making  Evolving/Moderate complexity    Rehab Potential  Fair    PT Frequency  2x / week    PT Duration    4 weeks    PT Treatment/Interventions  ADLs/Self Care Home Management;Biofeedback;Cryotherapy;Electrical Stimulation;Iontophoresis 21m/ml Dexamethasone;Moist Heat;Traction;Ultrasound;DME Instruction;Gait training;Stair training;Functional mobility training;Therapeutic activities;Patient/family education;Neuromuscular re-education;Balance training;Therapeutic exercise;Manual techniques;Energy conservation;Dry needling;Passive range of motion;Taping;Vestibular;Vasopneumatic Device    PT Next Visit Plan  assess back for POC    PT Home Exercise Plan  see above    Consulted and Agree with Plan of Care  Patient       Patient will benefit from skilled therapeutic intervention in order to improve the following deficits and impairments:  Abnormal gait, Decreased  activity tolerance, Decreased coordination, Decreased mobility, Decreased range of motion, Difficulty walking, Decreased strength, Hypomobility, Impaired flexibility, Impaired perceived functional ability, Increased muscle spasms, Postural dysfunction, Improper body mechanics, Pain  Visit Diagnosis: Acute left-sided low back pain with sciatica, sciatica laterality unspecified  Other lack of coordination  Muscle weakness (generalized)     Problem List Patient Active Problem List   Diagnosis Date Noted  . PFO (patent foramen ovale)   . CAD (coronary artery disease), native coronary artery   . [place]) [place] 06/15/2018  . Depression 10/16/2017  . Migraine 10/16/2017  . Hyperglycemia, unspecified 07/10/2017  . Hyperglycemia 07/10/2017  . H/O dizziness 08/02/2016  . Cervical disc disorder with radiculopathy of cervical region 04/12/2016  . Nonallopathic lesion of thoracic region 04/12/2016  . Nonallopathic lesion of rib cage 04/12/2016  . Facet arthritis of cervical region 03/21/2016  . Hyperlipidemia 01/26/2015  . Labral tear of shoulder, right, subsequent encounter 12/04/2014  . 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 Impingement syndrome of right shoulder 05/28/2014  . Right supraspinatus tenosynovitis 05/28/2014  . Arthritis pain 04/03/2014  . Fatigue 04/03/2014  . 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 IBS (irritable bowel syndrome) 06/20/2012  . Lumbar disc disease 06/20/2012  MJanna Arch PT, DPT    10/03/2019, 5:13 PM  [place] [facility] [address] [place] [address]Phone: [phone]  Fax:  [phone] Name: MSMANTHA BOAKYEMRN: [id number] of Birth: [birth year]

## 2019-10-15 ENCOUNTER — Other Ambulatory Visit: Payer: Self-pay

## 2019-10-15 ENCOUNTER — Ambulatory Visit: Payer: 59

## 2019-10-15 DIAGNOSIS — R293 Abnormal posture: Secondary | ICD-10-CM | POA: Diagnosis not present

## 2019-10-15 DIAGNOSIS — M6281 Muscle weakness (generalized): Secondary | ICD-10-CM

## 2019-10-15 DIAGNOSIS — R278 Other lack of coordination: Secondary | ICD-10-CM | POA: Diagnosis not present

## 2019-10-15 DIAGNOSIS — M5440 Lumbago with sciatica, unspecified side: Secondary | ICD-10-CM | POA: Diagnosis not present

## 2019-10-15 DIAGNOSIS — R471 Dysarthria and anarthria: Secondary | ICD-10-CM | POA: Diagnosis not present

## 2019-10-15 DIAGNOSIS — M542 Cervicalgia: Secondary | ICD-10-CM | POA: Diagnosis not present

## 2019-10-15 NOTE — Therapy (Signed)
[place] MAIN [facility] SERVICES [address] [place], [place], [address] Phone: [phone]   Fax:  [phone]  Physical Therapy Treatment  Patient Details  Name: Lori Morales MRN: [id number] Date of Birth: 11/13/1960 Referring Provider (PT): Tama High III   Encounter Date: 10/15/2019  PT End of Session - 10/15/19 1646    Visit Number  12    Number of Visits  16    Date for PT Re-Evaluation  10/22/19    Authorization Type  2/10 PN 5/4    PT Start Time  0846    PT Stop Time  0930    PT Time Calculation (min)  44 min    Activity Tolerance  Patient tolerated treatment well    Behavior During Therapy  [facility] for tasks assessed/performed       Past Medical History:  Diagnosis Date  . Arthritis   . CAD (coronary artery disease), native coronary artery    mild non calcified plaque in mid LAD and prox LCx on coronary CTA 01/2019  . Depression   . GERD (gastroesophageal reflux disease)   . Headache    migraines  . Hx of squamous cell carcinoma 07/11/2016   Right distal dorsum lat forearm   . Hypertension   . IBS (irritable bowel syndrome)   . Palpitations   . PFO (patent foramen ovale)    small by cardiac CTA 01/2019  . PONV (postoperative nausea and vomiting)   . Restless legs syndrome     Past Surgical History:  Procedure Laterality Date  . ABDOMINOPLASTY    . BACK SURGERY     1 laminectomy, 1 fusion  . COLONOSCOPY    . COLONOSCOPY WITH PROPOFOL N/A 01/17/2017   Procedure: COLONOSCOPY WITH PROPOFOL;  Surgeon: Lollie Sails, MD;  Location: [facility] ENDOSCOPY;  Service: Endoscopy;  Laterality: N/A;  . WISDOM TOOTH EXTRACTION      There were no vitals filed for this visit.  Subjective Assessment - 10/15/19 1645    Subjective  Patient has returned from her trip to [place]. Feels she is 75% back to her normal. Pain continues to centralize.    Pertinent History  Patient is a pleasant 59 year old female who presents with LBP w  sciatica of L side. PMH of hyperglycemia, elevated BP, arthritis, HLD, restless legs, chronic insomnia, prior vertiginous migraine, thalamic CVA, anxiety, CAD, depression, GERD, hx of cancer, IBS, lumbar disc disease (prior back surgery with fusion x2 L4-5, S1), and cervical disc disease. Her mother recently passed away with hospice. Has additionally worsening neck issues with MRI revealing disc bulge  with unsuccessful steroid taper. Per physician note pain is on left side of neck and down left shoulder. Additionally has discomfort in SI joint (left). Severe increase in low back radiating pain began a week prior to PT eval (08/18/19) with acute onset of weakness. 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    Limitations  Sitting;Lifting;Standing;Walking;House hold activities    How long can you sit comfortably?  ease off after a whie sitting but immediately painful    How long can you stand comfortably?  immediately painful and then eases off    How long can you walk comfortably?  a couple of minutes    Patient Stated Goals  pain to go away    Currently in Pain?  Yes    Pain Score  2  Pain Location  Back    Pain Orientation  Lower    Pain Descriptors / Indicators  Aching    Pain Type  Acute pain    Pain Onset  1 to 4 weeks ago    Pain Frequency  Intermittent          Supine:  Hamstring stretch with PT overpressure 60 second holds, leg on PT shoulder Popliteal angle lengthening LE limited 60 seconds; performed both LE SLR nerve glides with LE on PT shoulder 20x, cues for velocity of movement; performed both sides Piriformis stretch ;60 second holds Cross body single knee to chest hold 60 seconds each LE   posterior pelvic tilt 10x 5 second holds Posterior pelvic tilt with abduction with RTB around knees in hooklying 15x TrA activation with marching 15x each limb.    Prone: STM to lumbar paraspinals, quadratus, and inferior lat  musculature with implementation of effleurage and petrissage x 14 minutes ; noticeable muscle tissue guarding and knots in lateral aspect Mobilizations to thoracic spine 3x 20 seconds each level, excessively hypomobile with L>R.   Hip flexor lengthening with PT overpressure 30 second holds x 2 trials each position   Will see physician on Wednesday.    Pt educated throughout session about proper posture and technique with exercises. Improved exercise technique, movement at target joints, use of target muscles after min to mod verbal, visual, tactile cues                        PT Education - 10/15/19 1646    Education Details  exercise technique, body mechanics, manual    Person(s) Educated  Patient    Methods  Explanation;Demonstration;Tactile cues;Verbal cues    Comprehension  Verbalized understanding;Returned demonstration;Verbal cues required;Tactile cues required       PT Short Term Goals - 09/24/19 1306      PT SHORT TERM GOAL #1   Title  Patient will be independent in home exercise program to improve strength/mobility for better functional independence with ADLs.    Baseline  4/1: HEP given 4/27: HEP compliant    Time  2    Period  Weeks    Status  Partially Met    Target Date  10/08/19        PT Long Term Goals - 09/24/19 1307      PT LONG TERM GOAL #1   Title  Patient will report a worst pain of 3/10 on VAS in low back  to improve tolerance with ADLs and reduced symptoms with activities.    Baseline  4/1: 8/10 4/27: 4/10    Time  4    Period  Weeks    Status  Partially Met    Target Date  10/22/19      PT LONG TERM GOAL #2   Title  Patient will increase FOTO score to equal to or greater than  56/100   to demonstrate statistically significant improvement in mobility and quality of life.    Baseline  4/1: 44/100 4/27: 49.3%    Time  4    Period  Weeks    Status  Partially Met    Target Date  10/22/19      PT LONG TERM GOAL #3   Title  Patient  will increase BLE gross strength to 4+/5 as to improve functional strength for independent gait, increased standing tolerance and increased ADL ability.    Baseline  4/1:see note 4/27: see note, improved  by one point    Time  4    Period  Weeks    Status  Partially Met    Target Date  10/22/19      PT LONG TERM GOAL #4   Title  Patient will tolerate sitting unsupported for 15+ minutes to demonstrate improved back extensor strength and improved sitting tolerance.    Baseline  4/1: painful to sit at work 4/27: able to sit for > 15 minutes    Time  4    Period  Weeks    Status  Achieved      PT LONG TERM GOAL #5   Title  Patient will reduce modified Oswestry score to <20 as to demonstrate minimal disability with ADLs including improved sleeping tolerance, walking/sitting tolerance etc for better mobility with ADLs.    Baseline  4/27: 38%    Time  4    Period  Weeks    Status  New    Target Date  10/22/19            Plan - 10/15/19 1648    Clinical Impression Statement  Patient has returned from her trip to [place]. Back pain continues to centralize and has not changed since she was seen last. Patient does have noticeable muscle tissue length limitation of hip flexors bilaterally that improved with prolonged overpressure. Patient would benefit from skilled physical therapy to decrease pain and improve mobility to return patient to PLOF    Personal Factors and Comorbidities  Age;Comorbidity 3+;Past/Current Experience;Profession;Social Background    Comorbidities  hyperglycemia, elevated BP, arthritis, HLD, restless legs, chronic insomnia, prior vertiginous migraine, thalamic CVA, anxiety, CAD, depression, GERD, hx of cancer, IBS, lumbar disc disease (prior back surgery with fusion x2 L4-5, S1), and cervical disc disease.    Examination-Activity Limitations  Bathing;Bed Mobility;Bend;Caring for [employer];Locomotion  Level;Lift;Stand;Toileting;Transfers    Examination-Participation Restrictions  Cleaning;Community Activity;Driving;Interpersonal Relationship;Laundry;Volunteer;Meal Prep;Yard Work;Other    Stability/Clinical Decision Making  Evolving/Moderate complexity    Rehab Potential  Fair    PT Frequency  2x / week    PT Duration  4 weeks    PT Treatment/Interventions  ADLs/Self Care Home Management;Biofeedback;Cryotherapy;Electrical Stimulation;Iontophoresis 2m/ml Dexamethasone;Moist Heat;Traction;Ultrasound;DME Instruction;Gait training;Stair training;Functional mobility training;Therapeutic activities;Patient/family education;Neuromuscular re-education;Balance training;Therapeutic exercise;Manual techniques;Energy conservation;Dry needling;Passive range of motion;Taping;Vestibular;Vasopneumatic Device    PT Next Visit Plan  assess back for POC    PT Home Exercise Plan  see above    Consulted and Agree with Plan of Care  Patient       Patient will benefit from skilled therapeutic intervention in order to improve the following deficits and impairments:  Abnormal gait, Decreased activity tolerance, Decreased coordination, Decreased mobility, Decreased range of motion, Difficulty walking, Decreased strength, Hypomobility, Impaired flexibility, Impaired perceived functional ability, Increased muscle spasms, Postural dysfunction, Improper body mechanics, Pain  Visit Diagnosis: Acute left-sided low back pain with sciatica, sciatica laterality unspecified  Other lack of coordination  Muscle weakness (generalized)     Problem List Patient Active Problem List   Diagnosis Date Noted  . PFO (patent foramen ovale)   . CAD (coronary artery disease), native coronary artery   . Acute [place] ([place]brovascular [place]) [place] 06/[place]  . Depression 10/16/2017  . Migraine 10/16/2017  . Hyperglycemia, unspecified 07/10/2017  . Hyperglycemia 07/10/2017  . H/O dizziness 08/02/2016  . Cervical disc disorder  with radiculopathy of cervical region 04/12/2016  . Nonallopathic lesion of thoracic region 04/12/2016  . Nonallopathic lesion of rib cage 04/12/2016  . Facet arthritis of cervical  region 03/21/2016  . Hyperlipidemia 01/26/2015  . Labral tear of shoulder, right, subsequent encounter 12/04/2014  . 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 Impingement syndrome of right shoulder 05/28/2014  . Right supraspinatus tenosynovitis 05/28/2014  . Arthritis pain 04/03/2014  . Fatigue 04/03/2014  . 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 IBS (irritable bowel syndrome) 06/20/2012  . Lumbar disc disease 06/20/2012   Janna Arch, PT, DPT   10/16/2019, 7:49 AM  [place] MAIN [facility] SERVICES [address] [place], [place], [address] Phone: [phone]   Fax:  [phone]  Name: JOSSELYNE ONOFRIO MRN: [id number] Date of Birth: 1960/11/30

## 2019-10-16 DIAGNOSIS — M50123 Cervical disc disorder at C6-C7 level with radiculopathy: Secondary | ICD-10-CM | POA: Diagnosis not present

## 2019-10-17 ENCOUNTER — Ambulatory Visit: Payer: 59

## 2019-10-18 ENCOUNTER — Other Ambulatory Visit: Payer: Self-pay | Admitting: Internal Medicine

## 2019-10-18 ENCOUNTER — Other Ambulatory Visit: Payer: Self-pay

## 2019-10-18 ENCOUNTER — Ambulatory Visit: Payer: 59

## 2019-10-18 DIAGNOSIS — R278 Other lack of coordination: Secondary | ICD-10-CM | POA: Diagnosis not present

## 2019-10-18 DIAGNOSIS — R293 Abnormal posture: Secondary | ICD-10-CM | POA: Diagnosis not present

## 2019-10-18 DIAGNOSIS — M542 Cervicalgia: Secondary | ICD-10-CM | POA: Diagnosis not present

## 2019-10-18 DIAGNOSIS — M5440 Lumbago with sciatica, unspecified side: Secondary | ICD-10-CM

## 2019-10-18 DIAGNOSIS — M6281 Muscle weakness (generalized): Secondary | ICD-10-CM | POA: Diagnosis not present

## 2019-10-18 DIAGNOSIS — R471 Dysarthria and anarthria: Secondary | ICD-10-CM | POA: Diagnosis not present

## 2019-10-18 NOTE — Therapy (Signed)
[place] MAIN [facility] SERVICES [address] [place], [place], [address] Phone: [phone]   Fax:  [phone]  Physical Therapy Treatment  Patient Details  Name: Lori Morales MRN: [id number] Date of Birth: [birth year] Referring Provider (PT): Tama High III   Encounter Date: 10/18/2019  PT End of Session - 10/18/19 0819    Visit Number  13    Number of Visits  16    Date for PT Re-Evaluation  10/22/19    Authorization Type  2/10 PN 5/4    PT Start Time  0805    PT Stop Time  0845    PT Time Calculation (min)  40 min    Activity Tolerance  Patient tolerated treatment well;No increased pain    Behavior During Therapy  WFL for tasks assessed/performed       Past Medical History:  Diagnosis Date  . Arthritis   . CAD (coronary artery disease), native coronary artery    mild non calcified plaque in mid LAD and prox LCx on coronary CTA 01/2019  . Depression   . GERD (gastroesophageal reflux disease)   . Headache    migraines  . Hx of squamous cell carcinoma 07/11/2016   Right distal dorsum lat forearm   . Hypertension   . IBS (irritable bowel syndrome)   . Palpitations   . PFO (patent foramen ovale)    small by cardiac CTA 01/2019  . PONV (postoperative nausea and vomiting)   . Restless legs syndrome     Past Surgical History:  Procedure Laterality Date  . ABDOMINOPLASTY    . BACK SURGERY     1 laminectomy, 1 fusion  . COLONOSCOPY    . COLONOSCOPY WITH PROPOFOL N/A 01/17/2017   Procedure: COLONOSCOPY WITH PROPOFOL;  Surgeon: Lollie Sails, MD;  Location: [facility] ENDOSCOPY;  Service: Endoscopy;  Laterality: N/A;  . WISDOM TOOTH EXTRACTION      There were no vitals filed for this visit.  Subjective Assessment - 10/18/19 0812    Subjective  Pt reports she saw MD on Wednesday, he reported a cervical discopathy believes surgery will be inevitable, however would like her to try PT first. Pt awoke with some [place]  phenomenon in the left peroneus previs and Left piriformis, both mostly resolved after getting up. Upon arrival back pain is about a 4/10 this date which is increased since Tuesday.    Pertinent History  Patient is a pleasant 59 year old female who presents with LBP w sciatica of L side. PMH of hyperglycemia, elevated BP, arthritis, HLD, restless legs, chronic insomnia, prior vertiginous migraine, thalamic CVA, anxiety, CAD, depression, GERD, hx of cancer, IBS, lumbar disc disease (prior back surgery with fusion x2 L4-5, S1), and cervical disc disease. Her mother recently passed away with hospice. Has additionally worsening neck issues with MRI revealing disc bulge  with unsuccessful steroid taper. Per physician note pain is on left side of neck and down left shoulder. Additionally has discomfort in SI joint (left). Severe increase in low back radiating pain began a week prior to PT eval (08/18/19) with acute onset of weakness. 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    Limitations  Sitting;Lifting;Standing;Walking;House hold activities    How long can you sit comfortably?  ease off after a whie sitting but immediately painful    How long can you stand comfortably?  immediately painful and then eases  off    How long can you walk comfortably?  a couple of minutes    Patient Stated Goals  pain to go away    Currently in Pain?  Yes    Pain Score  4     Pain Location  Back    Pain Orientation  Left    Pain Descriptors / Indicators  Aching    Pain Type  Acute pain        INTERVENTION THIS DATE: -Hooklying SKTC 2x30sec Bilat  -Hooklying Fig 4 Stretch 2x30sec bilat -Hooklying bridge 2x15 (gentle repeated lumbar extension)  -Supine Single leg LAQ, arms supporting hip at 90* 2x15 bilat (hamstrings stretch/sciatic neurodynamics)  -Left myofascial release (common gluteal musculature about the left posterior iliac fossa) 4 minutes, moving into ART  (combined hip flexion)  -Hooklying Band Clam GTB 2x15  -Lateral side stepping GTB 2x30bilat (cued light and fast, on balls of feet; integrating ankle pronation and hip abductions in CKC)     PT Short Term Goals - 09/24/19 1306      PT SHORT TERM GOAL #1   Title  Patient will be independent in home exercise program to improve strength/mobility for better functional independence with ADLs.    Baseline  4/1: HEP given 4/27: HEP compliant    Time  2    Period  Weeks    Status  Partially Met    Target Date  10/08/19        PT Long Term Goals - 09/24/19 1307      PT LONG TERM GOAL #1   Title  Patient will report a worst pain of 3/10 on VAS in low back  to improve tolerance with ADLs and reduced symptoms with activities.    Baseline  4/1: 8/10 4/27: 4/10    Time  4    Period  Weeks    Status  Partially Met    Target Date  10/22/19      PT LONG TERM GOAL #2   Title  Patient will increase FOTO score to equal to or greater than  56/100   to demonstrate statistically significant improvement in mobility and quality of life.    Baseline  4/1: 44/100 4/27: 49.3%    Time  4    Period  Weeks    Status  Partially Met    Target Date  10/22/19      PT LONG TERM GOAL #3   Title  Patient will increase BLE gross strength to 4+/5 as to improve functional strength for independent gait, increased standing tolerance and increased ADL ability.    Baseline  4/1:see note 4/27: see note, improved by one point    Time  4    Period  Weeks    Status  Partially Met    Target Date  10/22/19      PT LONG TERM GOAL #4   Title  Patient will tolerate sitting unsupported for 15+ minutes to demonstrate improved back extensor strength and improved sitting tolerance.    Baseline  4/1: painful to sit at work 4/27: able to sit for > 15 minutes    Time  4    Period  Weeks    Status  Achieved      PT LONG TERM GOAL #5   Title  Patient will reduce modified Oswestry score to <20 as to demonstrate minimal  disability with ADLs including improved sleeping tolerance, walking/sitting tolerance etc for better mobility with ADLs.    Baseline  4/27: 38%    Time  4    Period  Weeks    Status  New    Target Date  10/22/19            Plan - 10/18/19 0820    Clinical Impression Statement Pt able to complete entire session as planned with rest breaks provided as needed. Pt maintains high level of focus and motivation. Extensive verbal, visual, and tactile cues are provided for most accurate form possible. Continued with goal of centralization of radicular symptoms, as well as manual therapy to address muscle spasms/pain, and activation of weak muscle groups in a CKC context. Pt reports fatigue in hip at end of session, but no pain exacerbation. Overall pt continues to make steady progress toward treatment goals.    Personal Factors and Comorbidities  Age;Comorbidity 3+;Past/Current Experience;Profession;Social Background    Comorbidities  hyperglycemia, elevated BP, arthritis, HLD, restless legs, chronic insomnia, prior vertiginous migraine, thalamic CVA, anxiety, CAD, depression, GERD, hx of cancer, IBS, lumbar disc disease (prior back surgery with fusion x2 L4-5, S1), and cervical disc disease.    Examination-Activity Limitations  Bathing;Bed Mobility;Bend;Caring for [employer];Locomotion Level;Lift;Stand;Toileting;Transfers    Examination-Participation Restrictions  Cleaning;Community Activity;Driving;Interpersonal Relationship;Laundry;Volunteer;Meal Prep;Yard Work;Other    Stability/Clinical Decision Making  Evolving/Moderate complexity    Rehab Potential  Fair    PT Frequency  2x / week    PT Duration  4 weeks    PT Treatment/Interventions  ADLs/Self Care Home Management;Biofeedback;Cryotherapy;Electrical Stimulation;Iontophoresis 43m/ml Dexamethasone;Moist Heat;Traction;Ultrasound;DME Instruction;Gait training;Stair training;Functional mobility  training;Therapeutic activities;Patient/family education;Neuromuscular re-education;Balance training;Therapeutic exercise;Manual techniques;Energy conservation;Dry needling;Passive range of motion;Taping;Vestibular;Vasopneumatic Device    PT Next Visit Plan  wrap up low back POC in prep for new episode for radiculopathy    PT Home Exercise Plan  no updates today    Consulted and Agree with Plan of Care  Patient       Patient will benefit from skilled therapeutic intervention in order to improve the following deficits and impairments:  Abnormal gait, Decreased activity tolerance, Decreased coordination, Decreased mobility, Decreased range of motion, Difficulty walking, Decreased strength, Hypomobility, Impaired flexibility, Impaired perceived functional ability, Increased muscle spasms, Postural dysfunction, Improper body mechanics, Pain  Visit Diagnosis: Acute left-sided low back pain with sciatica, sciatica laterality unspecified  Other lack of coordination  Muscle weakness (generalized)     Problem List Patient Active Problem List   Diagnosis Date Noted  . PFO (patent foramen ovale)   . CAD (coronary artery disease), native coronary artery   . Acute [place] ([place]) ([place] 06[place]  . Depression 10/16/2017  . Migraine 10/16/2017  . Hyperglycemia, unspecified 07/10/2017  . Hyperglycemia 07/10/2017  . H/O dizziness 08/02/2016  . Cervical disc disorder with radiculopathy of cervical region 04/12/2016  . Nonallopathic lesion of thoracic region 04/12/2016  . Nonallopathic lesion of rib cage 04/12/2016  . Facet arthritis of cervical region 03/21/2016  . Hyperlipidemia 01/26/2015  . Labral tear of shoulder, right, subsequent encounter 12/04/2014  . 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 Impingement syndrome of right shoulder 05/28/2014  . Right supraspinatus tenosynovitis 05/28/2014  . Arthritis pain 04/03/2014  . Fatigue 04/03/2014  . Cervical intraepithelial neoplasia grade 1  06/20/2012  . IBS (irritable bowel syndrome) 06/20/2012  . Lumbar disc disease 06/20/2012   8:47 AM, 10/18/19 AEtta Grandchild PT, DPT Physical Therapist - [facility] [phone](AG[place]    BC[place]itiC 10/18/2019, 8:23 AM  [place] [facility] [address]  [place], [place], [address] Phone: [phone]   Fax:  [phone]  Name: Lori Morales MRN: [id number] Date of Birth: Oct 05, 1960

## 2019-10-21 MED FILL — LOSARTAN POTASSIUM 25 MG TA: 25 | 30 days supply | Qty: 30 | Fill #2

## 2019-10-22 ENCOUNTER — Ambulatory Visit: Payer: 59

## 2019-10-22 ENCOUNTER — Other Ambulatory Visit: Payer: Self-pay | Admitting: Obstetrics and Gynecology

## 2019-10-22 ENCOUNTER — Other Ambulatory Visit: Payer: Self-pay

## 2019-10-22 DIAGNOSIS — M6281 Muscle weakness (generalized): Secondary | ICD-10-CM

## 2019-10-22 DIAGNOSIS — R278 Other lack of coordination: Secondary | ICD-10-CM | POA: Diagnosis not present

## 2019-10-22 DIAGNOSIS — R471 Dysarthria and anarthria: Secondary | ICD-10-CM | POA: Diagnosis not present

## 2019-10-22 DIAGNOSIS — Z1231 Encounter for screening mammogram for malignant neoplasm of breast: Secondary | ICD-10-CM | POA: Diagnosis not present

## 2019-10-22 DIAGNOSIS — M5440 Lumbago with sciatica, unspecified side: Secondary | ICD-10-CM

## 2019-10-22 DIAGNOSIS — N952 Postmenopausal atrophic vaginitis: Secondary | ICD-10-CM | POA: Diagnosis not present

## 2019-10-22 DIAGNOSIS — R293 Abnormal posture: Secondary | ICD-10-CM | POA: Diagnosis not present

## 2019-10-22 DIAGNOSIS — Z01419 Encounter for gynecological examination (general) (routine) without abnormal findings: Secondary | ICD-10-CM | POA: Diagnosis not present

## 2019-10-22 DIAGNOSIS — N951 Menopausal and female climacteric states: Secondary | ICD-10-CM | POA: Diagnosis not present

## 2019-10-22 DIAGNOSIS — M542 Cervicalgia: Secondary | ICD-10-CM | POA: Diagnosis not present

## 2019-10-22 NOTE — Therapy (Signed)
[place] MAIN [facility] SERVICES [address] [place], [place], [address] Phone: [phone]   Fax:  [phone]  Physical Therapy Treatment/Reassessment/Discharge  Patient Details  Name: Lori Morales MRN: [id number] Date of Birth: Oct 22, 1960 Referring Provider (PT): Tama High III   Encounter Date: 10/22/2019  PT End of Session - 10/22/19 0904    Visit Number  14    Number of Visits  16    Date for PT Re-Evaluation  10/22/19    PT Start Time  0851    PT Stop Time  0930    PT Time Calculation (min)  39 min    Activity Tolerance  Patient tolerated treatment well;No increased pain    Behavior During Therapy  WFL for tasks assessed/performed       Past Medical History:  Diagnosis Date  . Arthritis   . CAD (coronary artery disease), native coronary artery    mild non calcified plaque in mid LAD and prox LCx on coronary CTA 01/2019  . Depression   . GERD (gastroesophageal reflux disease)   . Headache    migraines  . Hx of squamous cell carcinoma 07/11/2016   Right distal dorsum lat forearm   . Hypertension   . IBS (irritable bowel syndrome)   . Palpitations   . PFO (patent foramen ovale)    small by cardiac CTA 01/2019  . PONV (postoperative nausea and vomiting)   . Restless legs syndrome     Past Surgical History:  Procedure Laterality Date  . ABDOMINOPLASTY    . BACK SURGERY     1 laminectomy, 1 fusion  . COLONOSCOPY    . COLONOSCOPY WITH PROPOFOL N/A 01/17/2017   Procedure: COLONOSCOPY WITH PROPOFOL;  Surgeon: Lollie Sails, MD;  Location: [facility] ENDOSCOPY;  Service: Endoscopy;  Laterality: N/A;  . WISDOM TOOTH EXTRACTION      There were no vitals filed for this visit.  Subjective Assessment - 10/22/19 0855    Subjective  Pt reports her back continues to feel better in the back, corrects complaint from last time regarding 'spam' complaint, says its more akin to twitching (fasiculation). Back has remained about a 1/10  since last session. Pt conitnues to have 4/10 pain in Left upper trap distribution from neck to scapula wiht intermittent shooting, and fluctuating paresthesias. Pt would    Pertinent History  Patient is a pleasant 59 year old female who presents with LBP w sciatica of L side. PMH of hyperglycemia, elevated BP, arthritis, HLD, restless legs, chronic insomnia, prior vertiginous migraine, thalamic CVA, anxiety, CAD, depression, GERD, hx of cancer, IBS, lumbar disc disease (prior back surgery with fusion x2 L4-5, S1), and cervical disc disease. Her mother recently passed away with hospice. Has additionally worsening neck issues with MRI revealing disc bulge  with unsuccessful steroid taper. Per physician note pain is on left side of neck and down left shoulder. Additionally has discomfort in SI joint (left). Severe increase in low back radiating pain began a week prior to PT eval (08/18/19) with acute onset of weakness. 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    How long can you stand comfortably?  ~30-45 minutes    How long can you walk comfortably?  not limited (has been walking up to 2 miles)    Patient Stated Goals  pain to go away    Currently in Pain?  Yes    Pain  Score  1       FOTO:  62.78  INTERVENTION THIS DATE: -HEP review (for managing back symptoms); all correct and appropriate, no modifications offered -Supine SKTC 2x30sec BILAT -Hooklying FABER stretch 1x60sec Bilat (table mediated)  -DKTC osciallation mobilization 1x20 -Prone hip anterior glide + hip extension 1x45sec bilat (psoas stretch)  MMT as below:  STRENGTH:  Graded on a 0-5 scale Muscle Group Left Right  Hip Flex 5/5 5/5  Hip Abd 3/5 4/5  Hip Add 5/5 5/5  Hip Ext (SLR) 4/5 4/5  Hip IR 4+/5 5/5  Hip ER 5/5 5/5  Knee Flex 5/5 5/5  Knee Ext 5/5 5/5  Ankle DF 5/5 5/5  Ankle PF 4/5 4/5      PT Short Term Goals - 10/22/19 6789      PT SHORT TERM GOAL #1    Title  Patient will be independent in home exercise program to improve strength/mobility for better functional independence with ADLs.    Baseline  4/1: HEP given 4/27: HEP compliant    Time  2    Period  Weeks    Status  Achieved    Target Date  10/08/19        PT Long Term Goals - 10/22/19 0914      PT LONG TERM GOAL #1   Title  Patient will report a worst pain of 3/10 on VAS in low back  to improve tolerance with ADLs and reduced symptoms with activities.    Baseline  4/1: 8/10 4/27: 4/10    Time  4    Period  Weeks    Status  Achieved      PT LONG TERM GOAL #2   Title  Patient will increase FOTO score to equal to or greater than  56/100   to demonstrate statistically significant improvement in mobility and quality of life.    Baseline  4/1: 44/100 4/27: 49.3% 5/25: 62/100.    Time  4    Period  Weeks    Status  Achieved    Target Date  10/22/19      PT LONG TERM GOAL #3   Title  Patient will increase BLE gross strength to 4+/5 as to improve functional strength for independent gait, increased standing tolerance and increased ADL ability.    Baseline  4/1:see note 4/27: see note, improved by one point; 5/25 4+/5 or greater thoughout. Still has some mild detectable weakness in 5/5 in hip flexion, 4+/5 hip internal rotation, hip extension.    Time  4    Period  Weeks    Status  Achieved    Target Date  10/22/19      PT LONG TERM GOAL #4   Title  Patient will tolerate sitting unsupported for 15+ minutes to demonstrate improved back extensor strength and improved sitting tolerance.    Baseline  4/1: painful to sit at work 4/27: able to sit for > 15 minutes    Time  4    Period  Weeks    Status  Achieved    Target Date  09/26/19      PT LONG TERM GOAL #5   Title  Patient will reduce modified Oswestry score to <20 as to demonstrate minimal disability with ADLs including improved sleeping tolerance, walking/sitting tolerance etc for better mobility with ADLs.    Baseline   4/27: 38%    Time  4    Period  Weeks    Status  New  Target Date  10/22/19            Plan - 10/22/19 0907    Clinical Impression Statement  Pt reports back pain feeling improved, stable, and successfully self managed. Pt would like ot be DC from PT for back so she can begin PT for her neck pain. Pt has met all LT and ST goals. Pt has shown vast improvement in strength testing, with minimal deficits remaining in Left hip ABDCT, IR, and extension. Pt has met her goal related to [employer]. Pt will now be DC from PT for low back diagnosis and is scheduled to be evaluated for neck pain next visit.    Personal Factors and Comorbidities  Age;Comorbidity 3+;Past/Current Experience;Profession;Social Background    Comorbidities  hyperglycemia, elevated BP, arthritis, HLD, restless legs, chronic insomnia, prior vertiginous migraine, thalamic CVA, anxiety, CAD, depression, GERD, hx of cancer, IBS, lumbar disc disease (prior back surgery with fusion x2 L4-5, S1), and cervical disc disease.    Examination-Activity Limitations  Bathing;Bed Mobility;Bend;Caring for [employer];Locomotion Level;Lift;Stand;Toileting;Transfers    Examination-Participation Restrictions  Cleaning;Community Activity;Driving;Interpersonal Relationship;Laundry;Volunteer;Meal Prep;Yard Work;Other    Stability/Clinical Decision Making  Evolving/Moderate complexity    Clinical Decision Making  Moderate    Rehab Potential  Fair    PT Frequency  2x / week    PT Duration  4 weeks    PT Treatment/Interventions  ADLs/Self Care Home Management;Biofeedback;Cryotherapy;Electrical Stimulation;Iontophoresis 66m/ml Dexamethasone;Moist Heat;Traction;Ultrasound;DME Instruction;Gait training;Stair training;Functional mobility training;Therapeutic activities;Patient/family education;Neuromuscular re-education;Balance training;Therapeutic exercise;Manual techniques;Energy conservation;Dry  needling;Passive range of motion;Taping;Vestibular;Vasopneumatic Device    PT Next Visit Plan  wrap up low back POC in prep for new episode for radiculopathy    PT Home Exercise Plan  no updates today    Consulted and Agree with Plan of Care  Patient       Patient will benefit from skilled therapeutic intervention in order to improve the following deficits and impairments:  Abnormal gait, Decreased activity tolerance, Decreased coordination, Decreased mobility, Decreased range of motion, Difficulty walking, Decreased strength, Hypomobility, Impaired flexibility, Impaired perceived functional ability, Increased muscle spasms, Postural dysfunction, Improper body mechanics, Pain  Visit Diagnosis: Acute left-sided low back pain with sciatica, sciatica laterality unspecified  Other lack of coordination  Muscle weakness (generalized)  Dysarthria and anarthria     Problem List Patient Active Problem List   Diagnosis Date Noted  . PFO (patent foramen ovale)   . CAD (coronary artery disease), native coronary artery   . [place]  . Depression 10/16/2017  . Migraine 10/16/2017  . Hyperglycemia, unspecified 07/10/2017  . Hyperglycemia 07/10/2017  . H/O dizziness 08/02/2016  . Cervical disc disorder with radiculopathy of cervical region 04/12/2016  . Nonallopathic lesion of thoracic region 04/12/2016  . Nonallopathic lesion of rib cage 04/12/2016  . Facet arthritis of cervical region 03/21/2016  . Hyperlipidemia 01/26/2015  . Labral tear of shoulder, right, subsequent encounter 12/04/2014  . 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 Impingement syndrome of right shoulder 05/28/2014  . Right supraspinatus tenosynovitis 05/28/2014  . Arthritis pain 04/03/2014  . Fatigue 04/03/2014  . 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 IBS (irritable bowel syndrome) 06/20/2012  . Lumbar disc disease 06/20/2012    Eugean Arnott C 10/22/2019, 10:31  AM  [place] [facility] [address] [place] [address]Phone: [phone]  Fax:  [phone] Name: Lori ROBOTHAMMRN: [id number] of Birth: 1August 01, 1962

## 2019-10-24 ENCOUNTER — Ambulatory Visit: Payer: 59

## 2019-10-24 ENCOUNTER — Other Ambulatory Visit: Payer: Self-pay

## 2019-10-24 DIAGNOSIS — R471 Dysarthria and anarthria: Secondary | ICD-10-CM | POA: Diagnosis not present

## 2019-10-24 DIAGNOSIS — M542 Cervicalgia: Secondary | ICD-10-CM | POA: Diagnosis not present

## 2019-10-24 DIAGNOSIS — M5440 Lumbago with sciatica, unspecified side: Secondary | ICD-10-CM | POA: Diagnosis not present

## 2019-10-24 DIAGNOSIS — R278 Other lack of coordination: Secondary | ICD-10-CM | POA: Diagnosis not present

## 2019-10-24 DIAGNOSIS — R293 Abnormal posture: Secondary | ICD-10-CM | POA: Diagnosis not present

## 2019-10-24 DIAGNOSIS — M6281 Muscle weakness (generalized): Secondary | ICD-10-CM | POA: Diagnosis not present

## 2019-10-24 NOTE — Therapy (Signed)
[place] MAIN [facility] SERVICES [address] [place], [place], [address] Phone: [phone]   Fax:  [phone]  Physical Therapy Evaluation  Patient Details  Name: Lori Morales MRN: [id number] Date of Birth: Oct 20, 1960 Referring Provider (PT): Tama High III   Encounter Date: 10/24/2019  PT End of Session - 10/24/19 1741    Visit Number  1    Number of Visits  12    Date for PT Re-Evaluation  12/05/19    Authorization Type  1/10 eval 10/24/19    PT Start Time  0847    PT Stop Time  0930    PT Time Calculation (min)  43 min    Activity Tolerance  Patient tolerated treatment well    Behavior During Therapy  [facility] for tasks assessed/performed       Past Medical History:  Diagnosis Date  . Arthritis   . CAD (coronary artery disease), native coronary artery    mild non calcified plaque in mid LAD and prox LCx on coronary CTA 01/2019  . Depression   . GERD (gastroesophageal reflux disease)   . Headache    migraines  . Hx of squamous cell carcinoma 07/11/2016   Right distal dorsum lat forearm   . Hypertension   . IBS (irritable bowel syndrome)   . Palpitations   . PFO (patent foramen ovale)    small by cardiac CTA 01/2019  . PONV (postoperative nausea and vomiting)   . Restless legs syndrome     Past Surgical History:  Procedure Laterality Date  . ABDOMINOPLASTY    . BACK SURGERY     1 laminectomy, 1 fusion  . COLONOSCOPY    . COLONOSCOPY WITH PROPOFOL N/A 01/17/2017   Procedure: COLONOSCOPY WITH PROPOFOL;  Surgeon: Lollie Sails, MD;  Location: [facility] ENDOSCOPY;  Service: Endoscopy;  Laterality: N/A;  . WISDOM TOOTH EXTRACTION      There were no vitals filed for this visit.   Subjective Assessment - 10/24/19 1721    Subjective  Patient is a pleasant 59 year old female who just finished a round of therapy with this author for her LBP with sciatica and now presents for neck pain.    Pertinent History  Patient is a pleasant  59 year old female with new authorization for cervical disc disorder at C6-7 with radiculopathy. Neck pain began December 2020. Prednisone pack x3 times since December and then epidural march 3rd. PMH of hyperglycemia, elevated BP, arthritis, HLD, restless legs, chronic insomnia, prior vertiginous migraine, thalamic CVA, anxiety, CAD, depression, GERD, hx of cancer, IBS, lumbar disc disease (prior back surgery with fusion x2 L4-5, S1), and cervical disc disease. 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    Limitations  Sitting;Lifting;Standing;House hold activities;Reading;Walking    How long can you sit comfortably?  needs to move head or use heat pack    How long can you stand comfortably?  n/a    How long can you walk comfortably?  n/a    Diagnostic tests  MRI:  disc bulge at C6-7    Patient Stated Goals  to decrease her pain    Currently in Pain?  Yes    Pain Score  6     Pain Location  Neck    Pain Orientation  Left    Pain Descriptors / Indicators  Throbbing;Tightness;Shooting    Pain Type  Neuropathic pain;Acute pain  Pain Radiating Towards  LUE to fingers, and occasionally RUE    Pain Onset  More than a month ago    Pain Frequency  Intermittent    Aggravating Factors   stress, prolonged positioning    Pain Relieving Factors  heat helps    Effect of Pain on Daily Activities  pain limits positions required at work, tinging in arms         Cervical disc disorder at C6-7 level with radiculopathy [facility] PT Assessment - 10/24/19 0001      Assessment   Medical Diagnosis  Cervical disc disorder at C6-7 level with radiculopathy    Referring Provider (PT)  Tama High III    Onset Date/Surgical Date  --   mid december 2020   Hand Dominance  Right    Prior Therapy  for back       Precautions   Precautions  Other (comment)   lumbar fusion      Restrictions   Weight Bearing Restrictions  No      Balance Screen   Has the  patient fallen in the past 6 months  No    Has the patient had a decrease in activity level because of a fear of falling?   Yes    Is the patient reluctant to leave their home because of a fear of falling?   No      Home Film/video editor residence    Living Arrangements  Spouse/significant other    Available Help at Discharge  Family    Type of [place] to enter    Entrance Stairs-Number of Steps  2      Prior Function   Level of Independence  Independent    Vocation  Full time employment    Vocation Requirements  prolonged sitting    Leisure  works out 5x/week. hiking, bird watching       Cognition   Overall Cognitive Status  Within Functional Limits for tasks assessed      Observation/Other Assessments   Focus on Therapeutic Outcomes (FOTO)   36%        PAIN: Neck pain:  Worst pain: 8-9/10 Best pain: 2/10 Radiates to shoulder from left ocipit to scapula, then tinglign to fingertips: 2nd and 3rd fingers worse , does have some on right side of neck now; when move head it feels heavy and weak.   POSTURE: Seated: forward head rounded shoulder, L shoulder anterior rotated with scapula protracted and elevated   PROM/AROM:    Right Left  Flexion 38  Extension 40*  Side Bending 31 25*  Rotation 23 33*    Accessory motions: Cervical: Hypomobile UPA and CPAs grade I-III. Thoracic: upper thoracic hypomobile UPA and CPAs  Scapula: limited mobilization due to soft tissue length of surrounding musculature and guarding.    STRENGTH:  Graded on a 0-5 scale Muscle Group Left Right  Shoulder flex 4-/5 4+/5  Shoulder Abd 3+/5 4/5  Shoulder Ext 3+/5 4-/5  Shoulder IR/ER 4-/5 4-/5  Elbow 4-/5 4-/5  Wrist/hand 3+/5 4-/5   L extension and hand movements : increase pain; weakness of L more than right.   SENSATION: Light touch sensation: Impaired LLE    SPECIAL TESTS: Limited first rib mobilitity bilaterally + radial  nerve + median, test limited by symptoms upon arm abduciton and flexion  Suboccipital release: distraction: decreased pain  Spurlings test limited by pain in  attempt to get into position Sharp Pursers test: -    OUTCOME MEASURES: TEST Outcome Interpretation  FOTO 36/100 Discharge score recommended 53/100   NDI 58% Severe Disability                     Treat:  STM to lateral cervical musculature with focus on upper trap and levator scapulae bilaterally. x9 minutes        Objective measurements completed on examination: See above findings.              PT Education - 10/24/19 1738    Education Details  goals, POC,    Person(s) Educated  Patient    Methods  Explanation    Comprehension  Verbalized understanding       PT Short Term Goals - 10/24/19 1744      PT SHORT TERM GOAL #1   Title  Patient will be independent in home exercise program to improve strength/mobility/pain relief for better functional independence with ADLs.    Baseline  5/27: give HEP next session    Time  2    Period  Weeks    Status  New    Target Date  11/07/19        PT Long Term Goals - 10/24/19 1745      PT LONG TERM GOAL #1   Title  Patient will report a worst pain of 3/10 on VAS in cervical spine to improve tolerance with ADLs and reduced symptoms with activities.    Baseline  5/27: 9/10    Time  6    Period  Weeks    Status  New    Target Date  12/05/19      PT LONG TERM GOAL #2   Title  Patient will increase FOTO score to equal to or greater than  53/100   to demonstrate statistically significant improvement in mobility and quality of life.    Baseline  5/27; 36/100    Time  6    Period  Weeks    Status  New    Target Date  12/05/19      PT LONG TERM GOAL #3   Title  Patient will reduce Neck Disability Index score to <10% to demonstrate minimal disability with ADL's including improved sleeping tolerance, sitting tolerance, etc for better mobility at home and work.     Baseline  5/27: 58%    Time  6    Period  Weeks    Status  New    Target Date  12/05/19      PT LONG TERM GOAL #4   Title  Patient will tolerate sitting unsupported with neutral head position for 15+ minutes to demonstrate improved postural musculature strength and improved sitting tolerance.    Baseline  5/27: painful to sit at work    Time  6    Period  Weeks    Status  New    Target Date  12/05/19             Plan - 10/24/19 1742    Clinical Impression Statement  Patient is a pleasant 59 year old female presenting for evaluation of cervical pain with radiculopathy after completing her therapy with this author for her low back. Patient is limited in cervical muscle tissue length in all directions with pain affecting L side more frequently than right. An additional nerve involvement is noted with radial and median nerves.  Her FOTO score of 36 combined  with her NDI of 58% indicate her impairment is affecting her quality of life. Patient would benefit from skilled physical therapy to reduce pain, improve muscle tissue length and posture, and return patient to PLOF.    Personal Factors and Comorbidities  Age;Comorbidity 3+;Past/Current Experience;Profession;Social Background;Time since onset of injury/illness/exacerbation    Comorbidities  hyperglycemia, elevated BP, arthritis, HLD, restless legs, chronic insomnia, prior vertiginous migraine, thalamic CVA, anxiety, CAD, depression, GERD, hx of cancer, IBS, lumbar disc disease (prior back surgery with fusion x2 L4-5, S1), and cervical disc disease.    Examination-Activity Limitations  Bed Mobility;Bend;Caring for [employer];Locomotion Level;Lift;Stand;Transfers;Hygiene/Grooming    Examination-Participation Restrictions  Cleaning;Community Activity;Driving;Interpersonal Relationship;Laundry;Volunteer;Meal Prep;Yard Work;Other    Stability/Clinical Decision Making  Evolving/Moderate complexity     Clinical Decision Making  Moderate    Rehab Potential  Fair    PT Frequency  2x / week    PT Duration  6 weeks    PT Treatment/Interventions  ADLs/Self Care Home Management;Biofeedback;Cryotherapy;Electrical Stimulation;Iontophoresis 4mg /ml Dexamethasone;Moist Heat;Traction;Ultrasound;DME Instruction;Functional mobility training;Therapeutic activities;Patient/family education;Neuromuscular re-education;Balance training;Therapeutic exercise;Manual techniques;Energy conservation;Dry needling;Passive range of motion;Taping;Vestibular;Vasopneumatic Device;Spinal Manipulations;Visual/perceptual remediation/compensation;Canalith Repostioning    PT Next Visit Plan  give cervical HEP. nerve glides, mobilize spine    Consulted and Agree with Plan of Care  Patient       Patient will benefit from skilled therapeutic intervention in order to improve the following deficits and impairments:  Decreased activity tolerance, Decreased coordination, Decreased mobility, Decreased range of motion, Decreased strength, Hypomobility, Impaired flexibility, Impaired perceived functional ability, Increased muscle spasms, Postural dysfunction, Improper body mechanics, Pain, Impaired sensation, Impaired UE functional use  Visit Diagnosis: Cervicalgia  Abnormal posture  Muscle weakness (generalized)     Problem List Patient Active Problem List   Diagnosis Date Noted  . PFO (patent foramen ovale)   . CAD (coronary artery disease), native coronary artery   . Acute CVA (cerebrovascular accident) ([place]) 06/15/2018  . Depression 10/16/2017  . Migraine 10/16/2017  . Hyperglycemia, unspecified 07/10/2017  . Hyperglycemia 07/10/2017  . H/O dizziness 08/02/2016  . Cervical disc disorder with radiculopathy of cervical region 04/12/2016  . Nonallopathic lesion of thoracic region 04/12/2016  . Nonallopathic lesion of rib cage 04/12/2016  . Facet arthritis of cervical region 03/21/2016  . Hyperlipidemia 01/26/2015  .  Labral tear of shoulder, right, subsequent encounter 12/04/2014  . 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 Impingement syndrome of right shoulder 05/28/2014  . Right supraspinatus tenosynovitis 05/28/2014  . Arthritis pain 04/03/2014  . Fatigue 04/03/2014  . 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 IBS (irritable bowel syndrome) 06/20/2012  . Lumbar disc disease 06/20/2012   Janna Arch, PT, DPT   10/24/2019, 5:51 PM  [place] MAIN [facility] SERVICES [address] [place], [place], [address] Phone: [phone]   Fax:  [phone]  Name: Lori Morales MRN: [id number] Date of Birth: 1961-02-14

## 2019-10-29 ENCOUNTER — Other Ambulatory Visit: Payer: Self-pay

## 2019-10-29 ENCOUNTER — Ambulatory Visit: Payer: 59 | Attending: Internal Medicine

## 2019-10-29 DIAGNOSIS — M6281 Muscle weakness (generalized): Secondary | ICD-10-CM | POA: Insufficient documentation

## 2019-10-29 DIAGNOSIS — M5440 Lumbago with sciatica, unspecified side: Secondary | ICD-10-CM | POA: Insufficient documentation

## 2019-10-29 DIAGNOSIS — M542 Cervicalgia: Secondary | ICD-10-CM | POA: Insufficient documentation

## 2019-10-29 DIAGNOSIS — R293 Abnormal posture: Secondary | ICD-10-CM | POA: Diagnosis not present

## 2019-10-29 NOTE — Therapy (Signed)
[place] MAIN [facility] SERVICES [address] [place], [place], [address] Phone: [phone]   Fax:  [phone]  Physical Therapy Treatment  Patient Details  Name: Lori Morales MRN: [id number] Date of Birth: 06-23-1960 Referring Provider (PT): Tama High III   Encounter Date: 10/29/2019  PT End of Session - 10/29/19 0943    Visit Number  2    Number of Visits  12    Date for PT Re-Evaluation  12/05/19    PT Start Time  0935    PT Stop Time  1015    PT Time Calculation (min)  40 min    Activity Tolerance  Patient tolerated treatment well;No increased pain    Behavior During Therapy  WFL for tasks assessed/performed       Past Medical History:  Diagnosis Date  . Arthritis   . CAD (coronary artery disease), native coronary artery    mild non calcified plaque in mid LAD and prox LCx on coronary CTA 01/2019  . Depression   . GERD (gastroesophageal reflux disease)   . Headache    migraines  . Hx of squamous cell carcinoma 07/11/2016   Right distal dorsum lat forearm   . Hypertension   . IBS (irritable bowel syndrome)   . Palpitations   . PFO (patent foramen ovale)    small by cardiac CTA 01/2019  . PONV (postoperative nausea and vomiting)   . Restless legs syndrome     Past Surgical History:  Procedure Laterality Date  . ABDOMINOPLASTY    . BACK SURGERY     1 laminectomy, 1 fusion  . COLONOSCOPY    . COLONOSCOPY WITH PROPOFOL N/A 01/17/2017   Procedure: COLONOSCOPY WITH PROPOFOL;  Surgeon: Lollie Sails, MD;  Location: [facility] ENDOSCOPY;  Service: Endoscopy;  Laterality: N/A;  . WISDOM TOOTH EXTRACTION      There were no vitals filed for this visit.  Subjective Assessment - 10/29/19 0937    Subjective  Pt was busy over weekend restoring her dad's old desk. Pt was mor estiff in back, but did well with recently updated exercises for low back.    Pertinent History  Patient is a pleasant 59 year old female with new  authorization for cervical disc disorder at C6-7 with radiculopathy. Neck pain began December 2020. Prednisone pack x3 times since December and then epidural march 3rd. PMH of hyperglycemia, elevated BP, arthritis, HLD, restless legs, chronic insomnia, prior vertiginous migraine, thalamic CVA, anxiety, CAD, depression, GERD, hx of cancer, IBS, lumbar disc disease (prior back surgery with fusion x2 L4-5, S1), and cervical disc disease. 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    Currently in Pain?  Yes    Pain Score  4     Pain Location  Neck    Pain Orientation  Left        INTERVENTION THIS DATE: -Cervical retraction + chin tuck into 2 pillows 15x5secH  -Supine cervical rotation c manual overpressure 15x3secH bilat -suboccipital release x5 minutes -manual cervical traction 5x30secH  -Cervical lateral glide mobilization C3/4, C4/5, C5/6 1x30sec each; hypomobile on Rt side @ C5/6 (requires Rt lateral flexion for symptom free mobilization.   -right radial nerve testing, has left radial nerve symptoms, worse with Left lateral flexion, improved with Right lateral lateral flexion  -Left medial NTT posstiive with minimal ABDCT, worse with Right cervical lateral stretch  -Ulnar neural tension testing  positive on Left side only -Education on Left medial nerve glides 15x3secH, will add to HEP at later date.   Added to HEP: cervical retraction c chin tuck to HEP (supine c 1-2 pillows), seated scapular elevation c neutral chin (DNF stabilization)      PT Short Term Goals - 10/24/19 1744      PT SHORT TERM GOAL #1   Title  Patient will be independent in home exercise program to improve strength/mobility/pain relief for better functional independence with ADLs.    Baseline  5/27: give HEP next session    Time  2    Period  Weeks    Status  New    Target Date  11/07/19        PT Long Term Goals - 10/24/19 1745      PT LONG TERM GOAL  #1   Title  Patient will report a worst pain of 3/10 on VAS in cervical spine to improve tolerance with ADLs and reduced symptoms with activities.    Baseline  5/27: 9/10    Time  6    Period  Weeks    Status  New    Target Date  12/05/19      PT LONG TERM GOAL #2   Title  Patient will increase FOTO score to equal to or greater than  53/100   to demonstrate statistically significant improvement in mobility and quality of life.    Baseline  5/27; 36/100    Time  6    Period  Weeks    Status  New    Target Date  12/05/19      PT LONG TERM GOAL #3   Title  Patient will reduce Neck Disability Index score to <10% to demonstrate minimal disability with ADL's including improved sleeping tolerance, sitting tolerance, etc for better mobility at home and work.    Baseline  5/27: 58%    Time  6    Period  Weeks    Status  New    Target Date  12/05/19      PT LONG TERM GOAL #4   Title  Patient will tolerate sitting unsupported with neutral head position for 15+ minutes to demonstrate improved postural musculature strength and improved sitting tolerance.    Baseline  5/27: painful to sit at work    Time  6    Period  Weeks    Status  New    Target Date  12/05/19            Plan - 10/29/19 0943    Clinical Impression Statement Pt able to complete entire session as planned with rest breaks provided as needed. Pt maintains high level of focus and motivation. Extensive verbal, visual, and tactile cues are provided for most accurate form possible, especially for teaching neural glides. Tested Neural tension this date with heavy Left median nerve and radial nerve restrictions, will be included in HEP at some point. Added 2 new HEP activities for home. Overall pt continues to make steady progress toward treatment goals.    Personal Factors and Comorbidities  Age;Comorbidity 3+;Past/Current Experience;Profession;Social Background;Time since onset of injury/illness/exacerbation    Comorbidities   hyperglycemia, elevated BP, arthritis, HLD, restless legs, chronic insomnia, prior vertiginous migraine, thalamic CVA, anxiety, CAD, depression, GERD, hx of cancer, IBS, lumbar disc disease (prior back surgery with fusion x2 L4-5, S1), and cervical disc disease.    Examination-Activity Limitations  Bed Mobility;Bend;Caring for [employer];Locomotion Level;Lift;Stand;Transfers;Hygiene/Grooming    Examination-Participation  Restrictions  Cleaning;Community Activity;Driving;Interpersonal Relationship;Laundry;Volunteer;Meal Prep;Yard Work;Other    Stability/Clinical Decision Making  Evolving/Moderate complexity    Clinical Decision Making  Moderate    Rehab Potential  Fair    PT Frequency  2x / week    PT Duration  6 weeks    PT Treatment/Interventions  ADLs/Self Care Home Management;Biofeedback;Cryotherapy;Electrical Stimulation;Iontophoresis 4mg /ml Dexamethasone;Moist Heat;Traction;Ultrasound;DME Instruction;Functional mobility training;Therapeutic activities;Patient/family education;Neuromuscular re-education;Balance training;Therapeutic exercise;Manual techniques;Energy conservation;Dry needling;Passive range of motion;Taping;Vestibular;Vasopneumatic Device;Spinal Manipulations;Visual/perceptual remediation/compensation;Canalith Repostioning    PT Next Visit Plan  give cervical HEP. nerve glides, mobilize spine    PT Home Exercise Plan  Cervical retraction supine, seated scapular retraction, seated scapular levation    Consulted and Agree with Plan of Care  Patient       Patient will benefit from skilled therapeutic intervention in order to improve the following deficits and impairments:  Decreased activity tolerance, Decreased coordination, Decreased mobility, Decreased range of motion, Decreased strength, Hypomobility, Impaired flexibility, Impaired perceived functional ability, Increased muscle spasms, Postural dysfunction, Improper body mechanics, Pain, Impaired  sensation, Impaired UE functional use  Visit Diagnosis: Cervicalgia  Abnormal posture  Muscle weakness (generalized)     Problem List Patient Active Problem List   Diagnosis Date Noted  . PFO (patent foramen ovale)   . CAD (coronary artery disease), native coronary artery   . Acute CVA (cerebrovascular accident) ([place]) 06/15/2018  . Depression 10/16/2017  . Migraine 10/16/2017  . Hyperglycemia, unspecified 07/10/2017  . Hyperglycemia 07/10/2017  . H/O dizziness 08/02/2016  . Cervical disc disorder with radiculopathy of cervical region 04/12/2016  . Nonallopathic lesion of thoracic region 04/12/2016  . Nonallopathic lesion of rib cage 04/12/2016  . Facet arthritis of cervical region 03/21/2016  . Hyperlipidemia 01/26/2015  . Labral tear of shoulder, right, subsequent encounter 12/04/2014  . 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 Impingement syndrome of right shoulder 05/28/2014  . Right supraspinatus tenosynovitis 05/28/2014  . Arthritis pain 04/03/2014  . Fatigue 04/03/2014  . 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 IBS (irritable bowel syndrome) 06/20/2012  . Lumbar disc disease 06/20/2012   9:52 AM, 10/29/19 Etta Grandchild, PT, DPT Physical Therapist - [place] [phone]     Etta Grandchild 10/29/2019, 9:49 AM  [place] MAIN [facility] SERVICES [address] [place], [place], [address] Phone: [phone]   Fax:  [phone]  Name: Lori Morales MRN: [id number] Date of Birth: [birth year]

## 2019-10-31 ENCOUNTER — Ambulatory Visit: Payer: 59

## 2019-10-31 ENCOUNTER — Other Ambulatory Visit: Payer: Self-pay

## 2019-10-31 DIAGNOSIS — M6281 Muscle weakness (generalized): Secondary | ICD-10-CM

## 2019-10-31 DIAGNOSIS — M542 Cervicalgia: Secondary | ICD-10-CM | POA: Diagnosis not present

## 2019-10-31 DIAGNOSIS — R293 Abnormal posture: Secondary | ICD-10-CM

## 2019-10-31 DIAGNOSIS — M5440 Lumbago with sciatica, unspecified side: Secondary | ICD-10-CM | POA: Diagnosis not present

## 2019-10-31 NOTE — Therapy (Signed)
[place] MAIN [facility] SERVICES [address] [place], [place], [address] Phone: [phone]   Fax:  [phone]  Physical Therapy Treatment  Patient Details  Name: Lori Morales MRN: [id number] Date of Birth: 08/17/1960 Referring Provider (PT): Tama High III   Encounter Date: 10/31/2019  PT End of Session - 10/31/19 1428    Visit Number  3    Number of Visits  12    Date for PT Re-Evaluation  12/05/19    PT Start Time  0845    PT Stop Time  0930    PT Time Calculation (min)  45 min    Activity Tolerance  Patient tolerated treatment well;No increased pain    Behavior During Therapy  WFL for tasks assessed/performed       Past Medical History:  Diagnosis Date  . Arthritis   . CAD (coronary artery disease), native coronary artery    mild non calcified plaque in mid LAD and prox LCx on coronary CTA 01/2019  . Depression   . GERD (gastroesophageal reflux disease)   . Headache    migraines  . Hx of squamous cell carcinoma 07/11/2016   Right distal dorsum lat forearm   . Hypertension   . IBS (irritable bowel syndrome)   . Palpitations   . PFO (patent foramen ovale)    small by cardiac CTA 01/2019  . PONV (postoperative nausea and vomiting)   . Restless legs syndrome     Past Surgical History:  Procedure Laterality Date  . ABDOMINOPLASTY    . BACK SURGERY     1 laminectomy, 1 fusion  . COLONOSCOPY    . COLONOSCOPY WITH PROPOFOL N/A 01/17/2017   Procedure: COLONOSCOPY WITH PROPOFOL;  Surgeon: Lollie Sails, MD;  Location: [facility] ENDOSCOPY;  Service: Endoscopy;  Laterality: N/A;  . WISDOM TOOTH EXTRACTION      There were no vitals filed for this visit.  Subjective Assessment - 10/31/19 1424    Subjective  Pt states she was sore in her neck/L scapular region after last session; she took some medication and it calmed down.  Today she reports she is having intermittent tingling in arm and having muscle spasms in L scapular area.     Pertinent History  Patient is a pleasant 59 year old female with new authorization for cervical disc disorder at C6-7 with radiculopathy. Neck pain began December 2020. Prednisone pack x3 times since December and then epidural march 3rd. PMH of hyperglycemia, elevated BP, arthritis, HLD, restless legs, chronic insomnia, prior vertiginous migraine, thalamic CVA, anxiety, CAD, depression, GERD, hx of cancer, IBS, lumbar disc disease (prior back surgery with fusion x2 L4-5, S1), and cervical disc disease. 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    Currently in Pain?  Yes    Pain Score  4     Pain Orientation  Left       Treatment Today: Pre-Tx Cervical Spine AROM (beginning of session):  Extension major restricted, rotation L moderate restricted, rotation R min restricted  -Manual Tx: Manual cervical traction 5x30 sec PROM rotation R x5, L x 5 Supine Man L UT stretch 30 sec x3 L C4-5, 5-6, 6-7 side glide mobs (Gr 1-2 ) 30 secx3 ea SOR 2 min Sidelying L S-T mob (focused on retraction/depression) 30 sec x 3 STM L rhomboids, UT, Lev scap, cervical mm (in sidelying position)   -L UE n. Glides:  Radial n. x10 Medial n. X10  -Seated scapular retraction 2x10  for postural mm retraining, PT verbal/tactile cues and instruction implemented and pt educated on purpose/benefit of this activity to promote improved lower scap mm activation and reduced upper trap/cervical mm activation in sitting.  Post-Tx Cervical Spine AROM: Extension mod restricted, rotation L and R min restricted      PT Education - 10/31/19 1427    Education Details  postural awareness in sitting with practicing gentle scapular retraction throughout work day    [employer]) Educated  Patient    Methods  Explanation;Demonstration;Tactile cues    Comprehension  Verbalized understanding;Returned demonstration;Need further instruction       PT Short Term Goals -  10/24/19 1744      PT SHORT TERM GOAL #1   Title  Patient will be independent in home exercise program to improve strength/mobility/pain relief for better functional independence with ADLs.    Baseline  5/27: give HEP next session    Time  2    Period  Weeks    Status  New    Target Date  11/07/19        PT Long Term Goals - 10/24/19 1745      PT LONG TERM GOAL #1   Title  Patient will report a worst pain of 3/10 on VAS in cervical spine to improve tolerance with ADLs and reduced symptoms with activities.    Baseline  5/27: 9/10    Time  6    Period  Weeks    Status  New    Target Date  12/05/19      PT LONG TERM GOAL #2   Title  Patient will increase FOTO score to equal to or greater than  53/100   to demonstrate statistically significant improvement in mobility and quality of life.    Baseline  5/27; 36/100    Time  6    Period  Weeks    Status  New    Target Date  12/05/19      PT LONG TERM GOAL #3   Title  Patient will reduce Neck Disability Index score to <10% to demonstrate minimal disability with ADL's including improved sleeping tolerance, sitting tolerance, etc for better mobility at home and work.    Baseline  5/27: 58%    Time  6    Period  Weeks    Status  New    Target Date  12/05/19      PT LONG TERM GOAL #4   Title  Patient will tolerate sitting unsupported with neutral head position for 15+ minutes to demonstrate improved postural musculature strength and improved sitting tolerance.    Baseline  5/27: painful to sit at work    Time  6    Period  Weeks    Status  New    Target Date  12/05/19            Plan - 10/31/19 1430    Clinical Impression Statement  Pt continues to have (+) radial and medial n tension signs; tolerated n. glides well without c/o increased UE or neck sx's at end of session.  She has significant palpable mm. tension throughout L UT/levator scap/rhomboids/cervical mm L>R.  Initiated postural mm retraining today and pt tolerated  this well- emphasized this for HEP.  Cervical rotation and extension AROM improved at end of tx.    Personal Factors and Comorbidities  Age;Comorbidity 3+;Past/Current Experience;Profession;Social Background;Time since onset of injury/illness/exacerbation  Comorbidities  hyperglycemia, elevated BP, arthritis, HLD, restless legs, chronic insomnia, prior vertiginous migraine, thalamic CVA, anxiety, CAD, depression, GERD, hx of cancer, IBS, lumbar disc disease (prior back surgery with fusion x2 L4-5, S1), and cervical disc disease.    Examination-Activity Limitations  Bed Mobility;Bend;Caring for [employer];Locomotion Level;Lift;Stand;Transfers;Hygiene/Grooming    Examination-Participation Restrictions  Cleaning;Community Activity;Driving;Interpersonal Relationship;Laundry;Volunteer;Meal Prep;Yard Work;Other    Stability/Clinical Decision Making  Evolving/Moderate complexity    Rehab Potential  Fair    PT Frequency  2x / week    PT Duration  6 weeks    PT Treatment/Interventions  ADLs/Self Care Home Management;Biofeedback;Cryotherapy;Electrical Stimulation;Iontophoresis 4mg /ml Dexamethasone;Moist Heat;Traction;Ultrasound;DME Instruction;Functional mobility training;Therapeutic activities;Patient/family education;Neuromuscular re-education;Balance training;Therapeutic exercise;Manual techniques;Energy conservation;Dry needling;Passive range of motion;Taping;Vestibular;Vasopneumatic Device;Spinal Manipulations;Visual/perceptual remediation/compensation;Canalith Repostioning    PT Next Visit Plan  give cervical HEP. nerve glides, mobilize spine    PT Home Exercise Plan  Cervical retraction supine, seated scapular retraction, seated scapular levation    Consulted and Agree with Plan of Care  Patient       Patient will benefit from skilled therapeutic intervention in order to improve the following deficits and impairments:  Decreased activity tolerance, Decreased  coordination, Decreased mobility, Decreased range of motion, Decreased strength, Hypomobility, Impaired flexibility, Impaired perceived functional ability, Increased muscle spasms, Postural dysfunction, Improper body mechanics, Pain, Impaired sensation, Impaired UE functional use  Visit Diagnosis: Cervicalgia  Abnormal posture  Muscle weakness (generalized)     Problem List Patient Active Problem List   Diagnosis Date Noted  . PFO (patent foramen ovale)   . CAD (coronary artery disease), native coronary artery   . Acute CVA (cerebrovascular accident) ([place]) 06/15/2018  . Depression 10/16/2017  . Migraine 10/16/2017  . Hyperglycemia, unspecified 07/10/2017  . Hyperglycemia 07/10/2017  . H/O dizziness 08/02/2016  . Cervical disc disorder with radiculopathy of cervical region 04/12/2016  . Nonallopathic lesion of thoracic region 04/12/2016  . Nonallopathic lesion of rib cage 04/12/2016  . Facet arthritis of cervical region 03/21/2016  . Hyperlipidemia 01/26/2015  . Labral tear of shoulder, right, subsequent encounter 12/04/2014  . 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 Impingement syndrome of right shoulder 05/28/2014  . Right supraspinatus tenosynovitis 05/28/2014  . Arthritis pain 04/03/2014  . Fatigue 04/03/2014  . 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 IBS (irritable bowel syndrome) 06/20/2012  . Lumbar disc disease 06/20/2012    Pincus Badder 10/31/2019, 2:35 PM Merdis Delay, PT, DPT Physical Therapist - [facility]  Outpatient Physical Therapy- [place] MAIN [facility] SERVICES [address] [place], [place], [address] Phone: [phone]   Fax:  [phone]  Name: VELINDA ROSENKRANTZ MRN: [id number] Date of Birth: March 09, 1961

## 2019-11-04 ENCOUNTER — Ambulatory Visit: Payer: 59

## 2019-11-06 ENCOUNTER — Ambulatory Visit: Payer: 59

## 2019-11-06 ENCOUNTER — Other Ambulatory Visit: Payer: Self-pay

## 2019-11-06 DIAGNOSIS — M5440 Lumbago with sciatica, unspecified side: Secondary | ICD-10-CM | POA: Diagnosis not present

## 2019-11-06 DIAGNOSIS — M6281 Muscle weakness (generalized): Secondary | ICD-10-CM

## 2019-11-06 DIAGNOSIS — R293 Abnormal posture: Secondary | ICD-10-CM

## 2019-11-06 DIAGNOSIS — M542 Cervicalgia: Secondary | ICD-10-CM | POA: Diagnosis not present

## 2019-11-06 NOTE — Therapy (Signed)
[place] MAIN [facility] SERVICES [address] [place], [place], [address] Phone: [phone]   Fax:  [phone]  Physical Therapy Treatment  Patient Details  Name: Lori Morales MRN: [id number] Date of Birth: 1960-07-30 Referring Provider (PT): Tama High III   Encounter Date: 11/06/2019  PT End of Session - 11/06/19 1002    Visit Number  4    Number of Visits  12    Date for PT Re-Evaluation  12/05/19    Authorization Type  4/10 eval 10/24/19    PT Start Time  0802    PT Stop Time  0845    PT Time Calculation (min)  43 min    Activity Tolerance  Patient tolerated treatment well;No increased pain    Behavior During Therapy  WFL for tasks assessed/performed       Past Medical History:  Diagnosis Date  . Arthritis   . CAD (coronary artery disease), native coronary artery    mild non calcified plaque in mid LAD and prox LCx on coronary CTA 01/2019  . Depression   . GERD (gastroesophageal reflux disease)   . Headache    migraines  . Hx of squamous cell carcinoma 07/11/2016   Right distal dorsum lat forearm   . Hypertension   . IBS (irritable bowel syndrome)   . Palpitations   . PFO (patent foramen ovale)    small by cardiac CTA 01/2019  . PONV (postoperative nausea and vomiting)   . Restless legs syndrome     Past Surgical History:  Procedure Laterality Date  . ABDOMINOPLASTY    . BACK SURGERY     1 laminectomy, 1 fusion  . COLONOSCOPY    . COLONOSCOPY WITH PROPOFOL N/A 01/17/2017   Procedure: COLONOSCOPY WITH PROPOFOL;  Surgeon: Lollie Sails, MD;  Location: [facility] ENDOSCOPY;  Service: Endoscopy;  Laterality: N/A;  . WISDOM TOOTH EXTRACTION      There were no vitals filed for this visit.  Subjective Assessment - 11/06/19 1000    Subjective  Patient reports she is having occasional headaches and tingling in L arm.    Pertinent History  Patient is a pleasant 59 year old female with new authorization for cervical disc  disorder at C6-7 with radiculopathy. Neck pain began December 2020. Prednisone pack x3 times since December and then epidural march 3rd. PMH of hyperglycemia, elevated BP, arthritis, HLD, restless legs, chronic insomnia, prior vertiginous migraine, thalamic CVA, anxiety, CAD, depression, GERD, hx of cancer, IBS, lumbar disc disease (prior back surgery with fusion x2 L4-5, S1), and cervical disc disease. 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    Currently in Pain?  Yes    Pain Score  4     Pain Location  Neck    Pain Orientation  Left    Pain Descriptors / Indicators  Aching;Stabbing    Pain Type  Neuropathic pain;Acute pain    Pain Onset  More than a month ago    Pain Frequency  Intermittent         Prone: Mobilizations: CPA/UPA grade II-III each level C1-T11. Hypomobile and painful throughout L side; improved with glides to right in cervical region. And inferior glides with subscap region.  Supine Cervical side bend with overpressure at occiput and shoulder, L and R 2x 30 seconds Cervical rotation with overpressure at occiput and shoulder L and R 2x 30 seconds  Suboccipital release  3x30 second holds Median nerve glide LUE 15x; improvements with repetition Bicep trigger point release with movement (flexion,extension, supination, pronation) x 6 minutes LUE  Seated: Forward trunk lean, resting head against raised plinth table for relaxation, STM to upper trap, levator scap, cervical paraspinals and portions of SCMs.  education on use of towel SNAGS during headaches performed with patient verbalizing understanding   Pt educated throughout session about proper posture and technique with exercises. Improved exercise technique, movement at target joints, use of target muscles after min to mod verbal, visual, tactile cues  Patient presents to physical therapy with high muscle guarding and tension, education on posture, pressure  relief, and use of towel SNAGs performed. Patient reports reduced pain and improved range of motion at end of session with manual and therex. provided as needed. Pt maintains high level of focus and motivation.Patient would benefit from skilled physical therapy to reduce pain, improve muscle tissue length and posture, and return patient to PLOF.                    PT Education - 11/06/19 1001    Education Details  exercise technique, body mechanics    Person(s) Educated  Patient    Methods  Explanation;Demonstration;Tactile cues;Verbal cues    Comprehension  Verbalized understanding;Returned demonstration;Verbal cues required;Tactile cues required       PT Short Term Goals - 10/24/19 1744      PT SHORT TERM GOAL #1   Title  Patient will be independent in home exercise program to improve strength/mobility/pain relief for better functional independence with ADLs.    Baseline  5/27: give HEP next session    Time  2    Period  Weeks    Status  New    Target Date  11/07/19        PT Long Term Goals - 10/24/19 1745      PT LONG TERM GOAL #1   Title  Patient will report a worst pain of 3/10 on VAS in cervical spine to improve tolerance with ADLs and reduced symptoms with activities.    Baseline  5/27: 9/10    Time  6    Period  Weeks    Status  New    Target Date  12/05/19      PT LONG TERM GOAL #2   Title  Patient will increase FOTO score to equal to or greater than  53/100   to demonstrate statistically significant improvement in mobility and quality of life.    Baseline  5/27; 36/100    Time  6    Period  Weeks    Status  New    Target Date  12/05/19      PT LONG TERM GOAL #3   Title  Patient will reduce Neck Disability Index score to <10% to demonstrate minimal disability with ADL's including improved sleeping tolerance, sitting tolerance, etc for better mobility at home and work.    Baseline  5/27: 58%    Time  6    Period  Weeks    Status  New    Target  Date  12/05/19      PT LONG TERM GOAL #4   Title  Patient will tolerate sitting unsupported with neutral head position for 15+ minutes to demonstrate improved postural musculature strength and improved sitting tolerance.    Baseline  5/27: painful to sit at work    Time  6    Period  [place]  Status  New    Target Date  12/05/19            Plan - 11/06/19 1004    Clinical Impression Statement  Patient presents to physical therapy with high muscle guarding and tension, education on posture, pressure relief, and use of towel SNAGs performed. Patient reports reduced pain and improved range of motion at end of session with manual and therex. provided as needed. Pt maintains high level of focus and motivation.Patient would benefit from skilled physical therapy to reduce pain, improve muscle tissue length and posture, and return patient to PLOF.    Personal Factors and Comorbidities  Age;Comorbidity 3+;Past/Current Experience;Profession;Social Background;Time since onset of injury/illness/exacerbation    Comorbidities  hyperglycemia, elevated BP, arthritis, HLD, restless legs, chronic insomnia, prior vertiginous migraine, thalamic CVA, anxiety, CAD, depression, GERD, hx of cancer, IBS, lumbar disc disease (prior back surgery with fusion x2 L4-5, S1), and cervical disc disease.    Examination-Activity Limitations  Bed Mobility;Bend;Caring for [employer];Locomotion Level;Lift;Stand;Transfers;Hygiene/Grooming    Examination-Participation Restrictions  Cleaning;Community Activity;Driving;Interpersonal Relationship;Laundry;Volunteer;Meal Prep;Yard Work;Other    Stability/Clinical Decision Making  Evolving/Moderate complexity    Rehab Potential  Fair    PT Frequency  2x / week    PT Duration  6 weeks    PT Treatment/Interventions  ADLs/Self Care Home Management;Biofeedback;Cryotherapy;Electrical Stimulation;Iontophoresis 4mg /ml Dexamethasone;Moist  Heat;Traction;Ultrasound;DME Instruction;Functional mobility training;Therapeutic activities;Patient/family education;Neuromuscular re-education;Balance training;Therapeutic exercise;Manual techniques;Energy conservation;Dry needling;Passive range of motion;Taping;Vestibular;Vasopneumatic Device;Spinal Manipulations;Visual/perceptual remediation/compensation;Canalith Repostioning    PT Next Visit Plan  give cervical HEP. nerve glides, mobilize spine    PT Home Exercise Plan  Cervical retraction supine, seated scapular retraction, seated scapular levation    Consulted and Agree with Plan of Care  Patient       Patient will benefit from skilled therapeutic intervention in order to improve the following deficits and impairments:  Decreased activity tolerance, Decreased coordination, Decreased mobility, Decreased range of motion, Decreased strength, Hypomobility, Impaired flexibility, Impaired perceived functional ability, Increased muscle spasms, Postural dysfunction, Improper body mechanics, Pain, Impaired sensation, Impaired UE functional use  Visit Diagnosis: Cervicalgia  Abnormal posture  Muscle weakness (generalized)     Problem List Patient Active Problem List   Diagnosis Date Noted  . PFO (patent foramen ovale)   . CAD (coronary artery disease), native coronary artery   . Acute CVA (cerebrovascular accident) ([place]) 06/15/2018  . Depression 10/16/2017  . Migraine 10/16/2017  . Hyperglycemia, unspecified 07/10/2017  . Hyperglycemia 07/10/2017  . H/O dizziness 08/02/2016  . Cervical disc disorder with radiculopathy of cervical region 04/12/2016  . Nonallopathic lesion of thoracic region 04/12/2016  . Nonallopathic lesion of rib cage 04/12/2016  . Facet arthritis of cervical region 03/21/2016  . Hyperlipidemia 01/26/2015  . Labral tear of shoulder, right, subsequent encounter 12/04/2014  . 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 Impingement syndrome of right shoulder 05/28/2014  . Right  supraspinatus tenosynovitis 05/28/2014  . Arthritis pain 04/03/2014  . Fatigue 04/03/2014  . 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 IBS (irritable bowel syndrome) 06/20/2012  . Lumbar disc disease 06/20/2012   Janna Arch, PT, DPT   11/06/2019, 10:06 AM  [place] MAIN [facility] SERVICES [address] [place], [place], [address] Phone: [phone]   Fax:  [phone]  Name: DELAILA NAND MRN: [id number] Date of Birth: July 17, 1960

## 2019-11-13 ENCOUNTER — Ambulatory Visit: Payer: 59

## 2019-11-13 ENCOUNTER — Other Ambulatory Visit: Payer: Self-pay

## 2019-11-13 DIAGNOSIS — M5440 Lumbago with sciatica, unspecified side: Secondary | ICD-10-CM

## 2019-11-13 DIAGNOSIS — R293 Abnormal posture: Secondary | ICD-10-CM

## 2019-11-13 DIAGNOSIS — M542 Cervicalgia: Secondary | ICD-10-CM

## 2019-11-13 DIAGNOSIS — M6281 Muscle weakness (generalized): Secondary | ICD-10-CM

## 2019-11-13 NOTE — Therapy (Signed)
[place] MAIN [facility] SERVICES [address] [place], [place], [address] Phone: [phone]   Fax:  [phone]  Physical Therapy Treatment  Patient Details  Name: Lori Morales MRN: [id number] Date of Birth: [birth year] Referring Provider (PT): Tama High III   Encounter Date: 11/13/2019   PT End of Session - 11/13/19 0858    Visit Number 5    Number of Visits 12    Date for PT Re-Evaluation 12/05/19    Authorization Type 5/10 eval 10/24/19    PT Start Time 0800    PT Stop Time 0846    PT Time Calculation (min) 46 min    Activity Tolerance Patient tolerated treatment well;No increased pain    Behavior During Therapy WFL for tasks assessed/performed           Past Medical History:  Diagnosis Date  . Arthritis   . CAD (coronary artery disease), native coronary artery    mild non calcified plaque in mid LAD and prox LCx on coronary CTA 01/2019  . Depression   . GERD (gastroesophageal reflux disease)   . Headache    migraines  . Hx of squamous cell carcinoma 07/11/2016   Right distal dorsum lat forearm   . Hypertension   . IBS (irritable bowel syndrome)   . Palpitations   . PFO (patent foramen ovale)    small by cardiac CTA 01/2019  . PONV (postoperative nausea and vomiting)   . Restless legs syndrome     Past Surgical History:  Procedure Laterality Date  . ABDOMINOPLASTY    . BACK SURGERY     1 laminectomy, 1 fusion  . COLONOSCOPY    . COLONOSCOPY WITH PROPOFOL N/A 01/17/2017   Procedure: COLONOSCOPY WITH PROPOFOL;  Surgeon: Lollie Sails, MD;  Location: [facility] ENDOSCOPY;  Service: Endoscopy;  Laterality: N/A;  . WISDOM TOOTH EXTRACTION      There were no vitals filed for this visit.   Subjective Assessment - 11/13/19 0854    Subjective Patient reports a couple of flare ups since last seen, potentially due to the prolonged time between visits. No falls LOB since last session.    Pertinent History Patient is a  pleasant 59 year old female with new authorization for cervical disc disorder at C6-7 with radiculopathy. Neck pain began December 2020. Prednisone pack x3 times since December and then epidural march 3rd. PMH of hyperglycemia, elevated BP, arthritis, HLD, restless legs, chronic insomnia, prior vertiginous migraine, thalamic CVA, anxiety, CAD, depression, GERD, hx of cancer, IBS, lumbar disc disease (prior back surgery with fusion x2 L4-5, S1), and cervical disc disease. 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    Currently in Pain? Yes    Pain Score 3     Pain Location Neck    Pain Orientation Left    Pain Descriptors / Indicators Aching;Throbbing    Pain Type Chronic pain;Neuropathic pain    Pain Onset More than a month ago    Pain Frequency Intermittent               Prone: Mobilizations: CPA/UPA grade II-III each level C1-T11. Hypomobile and painful throughout L side; improved with glides to right in cervical region. And inferior glides with subscap region.  STM to upper trap, levator scap, cervical paraspinals and portions of SCMs.   Supine Cervical side bend with overpressure at occiput and shoulder, L and R 2x  30 seconds Cervical rotation with overpressure at occiput and shoulder L and R 2x 30 seconds  Suboccipital release 3x30 second holds Median nerve glide LUE 15x; improvements with repetition Bicep trigger point release with movement (flexion,extension, supination, pronation) x 6 minutes LUE  Inferior L shoulder grade II-III glides 10x 5 seconds AP L shoulder grade II-III glides 10x 5 second holds  Seated: education on use of towel SNAGS during headaches performed with patient verbalizing understanding RTB row LUE 15x    Pt educated throughout session about proper posture and technique with exercises. Improved exercise technique, movement at target joints, use of target muscles after min to mod verbal, visual,  tactile cues                       PT Education - 11/13/19 0857    Education Details exercise technique, body mechanics    Person(s) Educated Patient    Methods Explanation;Demonstration;Tactile cues;Verbal cues    Comprehension Verbalized understanding;Returned demonstration;Verbal cues required;Tactile cues required            PT Short Term Goals - 10/24/19 1744      PT SHORT TERM GOAL #1   Title Patient will be independent in home exercise program to improve strength/mobility/pain relief for better functional independence with ADLs.    Baseline 5/27: give HEP next session    Time 2    Period Weeks    Status New    Target Date 11/07/19             PT Long Term Goals - 10/24/19 1745      PT LONG TERM GOAL #1   Title Patient will report a worst pain of 3/10 on VAS in cervical spine to improve tolerance with ADLs and reduced symptoms with activities.    Baseline 5/27: 9/10    Time 6    Period Weeks    Status New    Target Date 12/05/19      PT LONG TERM GOAL #2   Title Patient will increase FOTO score to equal to or greater than  53/100   to demonstrate statistically significant improvement in mobility and quality of life.    Baseline 5/27; 36/100    Time 6    Period Weeks    Status New    Target Date 12/05/19      PT LONG TERM GOAL #3   Title Patient will reduce Neck Disability Index score to <10% to demonstrate minimal disability with ADL's including improved sleeping tolerance, sitting tolerance, etc for better mobility at home and work.    Baseline 5/27: 58%    Time 6    Period Weeks    Status New    Target Date 12/05/19      PT LONG TERM GOAL #4   Title Patient will tolerate sitting unsupported with neutral head position for 15+ minutes to demonstrate improved postural musculature strength and improved sitting tolerance.    Baseline 5/27: painful to sit at work    Time 6    Period Weeks    Status New    Target Date 12/05/19                  Plan - 11/13/19 0906    Clinical Impression Statement Patient presents to physical therapy with increased muscle tissue tension. Use of manual and therex improved alignment and muscle tissue length for reduction of symptoms. Noted posterior delt muscle strength deficit. Patient would benefit from  skilled physical therapy to reduce pain, improve muscle tissue length and posture, and return patient to PLOF.    Personal Factors and Comorbidities Age;Comorbidity 3+;Past/Current Experience;Profession;Social Background;Time since onset of injury/illness/exacerbation    Comorbidities hyperglycemia, elevated BP, arthritis, HLD, restless legs, chronic insomnia, prior vertiginous migraine, thalamic CVA, anxiety, CAD, depression, GERD, hx of cancer, IBS, lumbar disc disease (prior back surgery with fusion x2 L4-5, S1), and cervical disc disease.    Examination-Activity Limitations Bed Mobility;Bend;Caring for [employer];Locomotion Level;Lift;Stand;Transfers;Hygiene/Grooming    Examination-Participation Restrictions Cleaning;Community Activity;Driving;Interpersonal Relationship;Laundry;Volunteer;Meal Prep;Yard Work;Other    Stability/Clinical Decision Making Evolving/Moderate complexity    Rehab Potential Fair    PT Frequency 2x / week    PT Duration 6 weeks    PT Treatment/Interventions ADLs/Self Care Home Management;Biofeedback;Cryotherapy;Electrical Stimulation;Iontophoresis 4mg /ml Dexamethasone;Moist Heat;Traction;Ultrasound;DME Instruction;Functional mobility training;Therapeutic activities;Patient/family education;Neuromuscular re-education;Balance training;Therapeutic exercise;Manual techniques;Energy conservation;Dry needling;Passive range of motion;Taping;Vestibular;Vasopneumatic Device;Spinal Manipulations;Visual/perceptual remediation/compensation;Canalith Repostioning    PT Next Visit Plan give cervical HEP. nerve glides, mobilize spine    PT Home  Exercise Plan Cervical retraction supine, seated scapular retraction, seated scapular levation    Consulted and Agree with Plan of Care Patient           Patient will benefit from skilled therapeutic intervention in order to improve the following deficits and impairments:  Decreased activity tolerance, Decreased coordination, Decreased mobility, Decreased range of motion, Decreased strength, Hypomobility, Impaired flexibility, Impaired perceived functional ability, Increased muscle spasms, Postural dysfunction, Improper body mechanics, Pain, Impaired sensation, Impaired UE functional use  Visit Diagnosis: Cervicalgia  Abnormal posture  Muscle weakness (generalized)  Acute left-sided low back pain with sciatica, sciatica laterality unspecified     Problem List Patient Active Problem List   Diagnosis Date Noted  . PFO (patent foramen ovale)   . CAD (coronary artery disease), native coronary artery   . Acute CVA (cerebrovascular accident) ([place]) 06/15/2018  . Depression 10/16/2017  . Migraine 10/16/2017  . Hyperglycemia, unspecified 07/10/2017  . Hyperglycemia 07/10/2017  . H/O dizziness 08/02/2016  . Cervical disc disorder with radiculopathy of cervical region 04/12/2016  . Nonallopathic lesion of thoracic region 04/12/2016  . Nonallopathic lesion of rib cage 04/12/2016  . Facet arthritis of cervical region 03/21/2016  . Hyperlipidemia 01/26/2015  . Labral tear of shoulder, right, subsequent encounter 12/04/2014  . 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 Impingement syndrome of right shoulder 05/28/2014  . Right supraspinatus tenosynovitis 05/28/2014  . Arthritis pain 04/03/2014  . Fatigue 04/03/2014  . 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 IBS (irritable bowel syndrome) 06/20/2012  . Lumbar disc disease 06/20/2012   Janna Arch, PT, DPT   11/13/2019, 9:08 AM  [place] MAIN [facility] SERVICES [address] [place], [place],  [address] Phone: [phone]   Fax:  [phone]  Name: MELVINIA ASHBY MRN: [id number] Date of Birth: Apr 19, 1961

## 2019-11-15 ENCOUNTER — Other Ambulatory Visit: Payer: Self-pay

## 2019-11-15 ENCOUNTER — Ambulatory Visit: Payer: 59

## 2019-11-15 DIAGNOSIS — M542 Cervicalgia: Secondary | ICD-10-CM

## 2019-11-15 DIAGNOSIS — M6281 Muscle weakness (generalized): Secondary | ICD-10-CM

## 2019-11-15 DIAGNOSIS — R293 Abnormal posture: Secondary | ICD-10-CM

## 2019-11-15 DIAGNOSIS — M5440 Lumbago with sciatica, unspecified side: Secondary | ICD-10-CM | POA: Diagnosis not present

## 2019-11-15 MED FILL — LOSARTAN POTASSIUM 25 MG TA: 25 | 90 days supply | Qty: 90 | Fill #0

## 2019-11-15 NOTE — Therapy (Signed)
[place] MAIN [facility] SERVICES [address] [place], [place], [address] Phone: [phone]   Fax:  [phone]  Physical Therapy Treatment  Patient Details  Name: Lori Morales MRN: [id number] Date of Birth: June 05, 1960 Referring Provider (PT): Tama High III   Encounter Date: 11/15/2019   PT End of Session - 11/15/19 1034    Visit Number 6    Number of Visits 12    Date for PT Re-Evaluation 12/05/19    Authorization Type 6/10 eval 10/24/19    PT Start Time 0930    PT Stop Time 1012    PT Time Calculation (min) 42 min    Activity Tolerance Patient tolerated treatment well;No increased pain    Behavior During Therapy WFL for tasks assessed/performed           Past Medical History:  Diagnosis Date  . Arthritis   . CAD (coronary artery disease), native coronary artery    mild non calcified plaque in mid LAD and prox LCx on coronary CTA 01/2019  . Depression   . GERD (gastroesophageal reflux disease)   . Headache    migraines  . Hx of squamous cell carcinoma 07/11/2016   Right distal dorsum lat forearm   . Hypertension   . IBS (irritable bowel syndrome)   . Palpitations   . PFO (patent foramen ovale)    small by cardiac CTA 01/2019  . PONV (postoperative nausea and vomiting)   . Restless legs syndrome     Past Surgical History:  Procedure Laterality Date  . ABDOMINOPLASTY    . BACK SURGERY     1 laminectomy, 1 fusion  . COLONOSCOPY    . COLONOSCOPY WITH PROPOFOL N/A 01/17/2017   Procedure: COLONOSCOPY WITH PROPOFOL;  Surgeon: Lollie Sails, MD;  Location: [facility] ENDOSCOPY;  Service: Endoscopy;  Laterality: N/A;  . WISDOM TOOTH EXTRACTION      There were no vitals filed for this visit.   Subjective Assessment - 11/15/19 1032    Subjective Patient presents to physical therapy after having a migraine yesterday and high tension.    Pertinent History Patient is a pleasant 59 year old female with new authorization for  cervical disc disorder at C6-7 with radiculopathy. Neck pain began December 2020. Prednisone pack x3 times since December and then epidural march 3rd. PMH of hyperglycemia, elevated BP, arthritis, HLD, restless legs, chronic insomnia, prior vertiginous migraine, thalamic CVA, anxiety, CAD, depression, GERD, hx of cancer, IBS, lumbar disc disease (prior back surgery with fusion x2 L4-5, S1), and cervical disc disease. 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    Currently in Pain? Yes    Pain Score 4     Pain Location Neck    Pain Orientation Left    Pain Descriptors / Indicators Aching    Pain Type Chronic pain;Neuropathic pain    Pain Onset More than a month ago    Pain Frequency Intermittent               Had a migraine yesterday Prone: Mobilizations: CPA/UPA grade II-III each level C1-T11. Hypomobile and painful throughout L side; improved with glides to right in cervical region. And inferior glides with subscap region.  STM to upper trap, levator scap, cervical paraspinals and portions of SCMs. X 18 minutes   Supine Cervical side bend with overpressure at occiput and shoulder, L and R 2x 30 seconds Cervical rotation  with overpressure at occiput and shoulder L and R 2x 30 seconds  Suboccipital release 3x30 second holds Median nerve glide LUE 15x; improvements with repetition Bicep trigger point release with movement (flexion,extension, supination, pronation) x 6 minutes LUE  Inferior L shoulder grade II-III glides 10x 5 seconds AP L shoulder grade II-III glides 10x 5 second holds       Pt educated throughout session about proper posture and technique with exercises. Improved exercise technique, movement at target joints, use of target muscles after min to mod verbal, visual, tactile cues  Patient presents with high muscle tissue guarding requiring extensive manual for reduction of tension, symptoms, and pain. Patient  requires focused lengthening techniques as well as STM and by end of session reports no pain just soreness. Patient would benefit from skilled physical therapy to reduce pain, improve muscle tissue length and posture, and return patient to PLOF.                     PT Education - 11/15/19 1033    Education Details exercise technique, body mechanics    Person(s) Educated Patient    Methods Explanation;Demonstration;Tactile cues;Verbal cues    Comprehension Verbalized understanding;Returned demonstration;Verbal cues required;Tactile cues required            PT Short Term Goals - 10/24/19 1744      PT SHORT TERM GOAL #1   Title Patient will be independent in home exercise program to improve strength/mobility/pain relief for better functional independence with ADLs.    Baseline 5/27: give HEP next session    Time 2    Period Weeks    Status New    Target Date 11/07/19             PT Long Term Goals - 10/24/19 1745      PT LONG TERM GOAL #1   Title Patient will report a worst pain of 3/10 on VAS in cervical spine to improve tolerance with ADLs and reduced symptoms with activities.    Baseline 5/27: 9/10    Time 6    Period Weeks    Status New    Target Date 12/05/19      PT LONG TERM GOAL #2   Title Patient will increase FOTO score to equal to or greater than  53/100   to demonstrate statistically significant improvement in mobility and quality of life.    Baseline 5/27; 36/100    Time 6    Period Weeks    Status New    Target Date 12/05/19      PT LONG TERM GOAL #3   Title Patient will reduce Neck Disability Index score to <10% to demonstrate minimal disability with ADL's including improved sleeping tolerance, sitting tolerance, etc for better mobility at home and work.    Baseline 5/27: 58%    Time 6    Period Weeks    Status New    Target Date 12/05/19      PT LONG TERM GOAL #4   Title Patient will tolerate sitting unsupported with neutral head  position for 15+ minutes to demonstrate improved postural musculature strength and improved sitting tolerance.    Baseline 5/27: painful to sit at work    Time 6    Period Weeks    Status New    Target Date 12/05/19                 Plan - 11/15/19 1036    Clinical Impression  Statement Patient presents with high muscle tissue guarding requiring extensive manual for reduction of tension, symptoms, and pain. Patient requires focused lengthening techniques as well as STM and by end of session reports no pain just soreness. Patient would benefit from skilled physical therapy to reduce pain, improve muscle tissue length and posture, and return patient to PLOF.    Personal Factors and Comorbidities Age;Comorbidity 3+;Past/Current Experience;Profession;Social Background;Time since onset of injury/illness/exacerbation    Comorbidities hyperglycemia, elevated BP, arthritis, HLD, restless legs, chronic insomnia, prior vertiginous migraine, thalamic CVA, anxiety, CAD, depression, GERD, hx of cancer, IBS, lumbar disc disease (prior back surgery with fusion x2 L4-5, S1), and cervical disc disease.    Examination-Activity Limitations Bed Mobility;Bend;Caring for [employer];Locomotion Level;Lift;Stand;Transfers;Hygiene/Grooming    Examination-Participation Restrictions Cleaning;Community Activity;Driving;Interpersonal Relationship;Laundry;Volunteer;Meal Prep;Yard Work;Other    Stability/Clinical Decision Making Evolving/Moderate complexity    Rehab Potential Fair    PT Frequency 2x / week    PT Duration 6 weeks    PT Treatment/Interventions ADLs/Self Care Home Management;Biofeedback;Cryotherapy;Electrical Stimulation;Iontophoresis 4mg /ml Dexamethasone;Moist Heat;Traction;Ultrasound;DME Instruction;Functional mobility training;Therapeutic activities;Patient/family education;Neuromuscular re-education;Balance training;Therapeutic exercise;Manual techniques;Energy  conservation;Dry needling;Passive range of motion;Taping;Vestibular;Vasopneumatic Device;Spinal Manipulations;Visual/perceptual remediation/compensation;Canalith Repostioning    PT Next Visit Plan give cervical HEP. nerve glides, mobilize spine    PT Home Exercise Plan Cervical retraction supine, seated scapular retraction, seated scapular levation    Consulted and Agree with Plan of Care Patient           Patient will benefit from skilled therapeutic intervention in order to improve the following deficits and impairments:  Decreased activity tolerance, Decreased coordination, Decreased mobility, Decreased range of motion, Decreased strength, Hypomobility, Impaired flexibility, Impaired perceived functional ability, Increased muscle spasms, Postural dysfunction, Improper body mechanics, Pain, Impaired sensation, Impaired UE functional use  Visit Diagnosis: Cervicalgia  Abnormal posture  Muscle weakness (generalized)     Problem List Patient Active Problem List   Diagnosis Date Noted  . PFO (patent foramen ovale)   . CAD (coronary artery disease), native coronary artery   . Acute CVA (cerebrovascular accident) ([place]) 06/15/2018  . Depression 10/16/2017  . Migraine 10/16/2017  . Hyperglycemia, unspecified 07/10/2017  . Hyperglycemia 07/10/2017  . H/O dizziness 08/02/2016  . Cervical disc disorder with radiculopathy of cervical region 04/12/2016  . Nonallopathic lesion of thoracic region 04/12/2016  . Nonallopathic lesion of rib cage 04/12/2016  . Facet arthritis of cervical region 03/21/2016  . Hyperlipidemia 01/26/2015  . Labral tear of shoulder, right, subsequent encounter 12/04/2014  . 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 Impingement syndrome of right shoulder 05/28/2014  . Right supraspinatus tenosynovitis 05/28/2014  . Arthritis pain 04/03/2014  . Fatigue 04/03/2014  . 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 IBS (irritable bowel syndrome) 06/20/2012  . Lumbar disc  disease 06/20/2012   Janna Arch, PT, DPT   11/15/2019, 10:38 AM  [place] MAIN [facility] SERVICES [address] [place], [place], [address] Phone: [phone]   Fax:  [phone]  Name: ADRINNE SZE MRN: [id number] Date of Birth: [birth year]

## 2019-11-18 ENCOUNTER — Ambulatory Visit: Payer: 59

## 2019-11-18 ENCOUNTER — Other Ambulatory Visit: Payer: Self-pay

## 2019-11-18 DIAGNOSIS — M6281 Muscle weakness (generalized): Secondary | ICD-10-CM | POA: Diagnosis not present

## 2019-11-18 DIAGNOSIS — M5440 Lumbago with sciatica, unspecified side: Secondary | ICD-10-CM

## 2019-11-18 DIAGNOSIS — R293 Abnormal posture: Secondary | ICD-10-CM | POA: Diagnosis not present

## 2019-11-18 DIAGNOSIS — M542 Cervicalgia: Secondary | ICD-10-CM | POA: Diagnosis not present

## 2019-11-18 NOTE — Therapy (Signed)
[place] MAIN [facility] SERVICES [address] [place], [place], [address] Phone: [phone]   Fax:  [phone]  Physical Therapy Treatment  Patient Details  Name: RHYLI DEPAULA MRN: [id number] Date of Birth: 1961/04/16 Referring Provider (PT): Tama High III   Encounter Date: 11/18/2019   PT End of Session - 11/18/19 1729    Visit Number 7    Number of Visits 12    Date for PT Re-Evaluation 12/05/19    Authorization Type 7/10 eval 10/24/19    PT Start Time 0845    PT Stop Time 0931    PT Time Calculation (min) 46 min    Activity Tolerance Patient tolerated treatment well;No increased pain    Behavior During Therapy WFL for tasks assessed/performed           Past Medical History:  Diagnosis Date  . Arthritis   . CAD (coronary artery disease), native coronary artery    mild non calcified plaque in mid LAD and prox LCx on coronary CTA 01/2019  . Depression   . GERD (gastroesophageal reflux disease)   . Headache    migraines  . Hx of squamous cell carcinoma 07/11/2016   Right distal dorsum lat forearm   . Hypertension   . IBS (irritable bowel syndrome)   . Palpitations   . PFO (patent foramen ovale)    small by cardiac CTA 01/2019  . PONV (postoperative nausea and vomiting)   . Restless legs syndrome     Past Surgical History:  Procedure Laterality Date  . ABDOMINOPLASTY    . BACK SURGERY     1 laminectomy, 1 fusion  . COLONOSCOPY    . COLONOSCOPY WITH PROPOFOL N/A 01/17/2017   Procedure: COLONOSCOPY WITH PROPOFOL;  Surgeon: Lollie Sails, MD;  Location: [facility] ENDOSCOPY;  Service: Endoscopy;  Laterality: N/A;  . WISDOM TOOTH EXTRACTION      There were no vitals filed for this visit.   Subjective Assessment - 11/18/19 1728    Subjective Patient reports work is very stressful lately and has increased tension.    Pertinent History Patient is a pleasant 59 year old female with new authorization for cervical disc  disorder at C6-7 with radiculopathy. Neck pain began December 2020. Prednisone pack x3 times since December and then epidural march 3rd. PMH of hyperglycemia, elevated BP, arthritis, HLD, restless legs, chronic insomnia, prior vertiginous migraine, thalamic CVA, anxiety, CAD, depression, GERD, hx of cancer, IBS, lumbar disc disease (prior back surgery with fusion x2 L4-5, S1), and cervical disc disease. 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    Currently in Pain? Yes    Pain Score 3     Pain Location Neck    Pain Orientation Left;Posterior    Pain Descriptors / Indicators Aching    Pain Type Neuropathic pain;Chronic pain    Pain Onset More than a month ago    Pain Frequency Intermittent              Prone: Mobilizations: CPA/UPA grade II-III each level C1-T11. Hypomobile and painful throughout L side; improved with glides to right in cervical region. And inferior glides with subscap region.  STM to upper trap, levator scap, cervical paraspinals and portions of SCMs with focus on trigger point reduction . X 18 minutes   Supine Cervical side bend with overpressure at occiput and shoulder, L and R 4x 30 seconds Cervical rotation  with overpressure at occiput and shoulder L and R 4x 30 seconds  Suboccipital release 3x30 second holds Median nerve glide BUE 15x; improvements with repetition  Seated:   scapular retraction with depression mobilization grade II with overpressure and rotation 3 minutes Scapular retractions 10x        Pt educated throughout session about proper posture and technique with exercises. Improved exercise technique, movement at target joints, use of target muscles after min to mod verbal, visual, tactile cues                       PT Education - 11/18/19 1729    Education Details exercise technique, body mechanics, manual    Person(s) Educated Patient    Methods  Explanation;Demonstration;Tactile cues;Verbal cues    Comprehension Verbalized understanding;Returned demonstration;Verbal cues required;Tactile cues required            PT Short Term Goals - 10/24/19 1744      PT SHORT TERM GOAL #1   Title Patient will be independent in home exercise program to improve strength/mobility/pain relief for better functional independence with ADLs.    Baseline 5/27: give HEP next session    Time 2    Period Weeks    Status New    Target Date 11/07/19             PT Long Term Goals - 10/24/19 1745      PT LONG TERM GOAL #1   Title Patient will report a worst pain of 3/10 on VAS in cervical spine to improve tolerance with ADLs and reduced symptoms with activities.    Baseline 5/27: 9/10    Time 6    Period Weeks    Status New    Target Date 12/05/19      PT LONG TERM GOAL #2   Title Patient will increase FOTO score to equal to or greater than  53/100   to demonstrate statistically significant improvement in mobility and quality of life.    Baseline 5/27; 36/100    Time 6    Period Weeks    Status New    Target Date 12/05/19      PT LONG TERM GOAL #3   Title Patient will reduce Neck Disability Index score to <10% to demonstrate minimal disability with ADL's including improved sleeping tolerance, sitting tolerance, etc for better mobility at home and work.    Baseline 5/27: 58%    Time 6    Period Weeks    Status New    Target Date 12/05/19      PT LONG TERM GOAL #4   Title Patient will tolerate sitting unsupported with neutral head position for 15+ minutes to demonstrate improved postural musculature strength and improved sitting tolerance.    Baseline 5/27: painful to sit at work    Time 6    Period Weeks    Status New    Target Date 12/05/19                 Plan - 11/19/19 1226    Clinical Impression Statement Patient continues to have regions of high tension and muscle guarding with right side more affected than left this  session. Extensive manual with deep tissue trigger point release and lengthening techniques reduced symptoms with patient reporting reduction to 0/10 pain with just soreness afterwards. Patient would benefit from skilled physical therapy to reduce pain, improve muscle tissue length and posture, and return patient to PLOF  Personal Factors and Comorbidities Age;Comorbidity 3+;Past/Current Experience;Profession;Social Background;Time since onset of injury/illness/exacerbation    Comorbidities hyperglycemia, elevated BP, arthritis, HLD, restless legs, chronic insomnia, prior vertiginous migraine, thalamic CVA, anxiety, CAD, depression, GERD, hx of cancer, IBS, lumbar disc disease (prior back surgery with fusion x2 L4-5, S1), and cervical disc disease.    Examination-Activity Limitations Bed Mobility;Bend;Caring for [employer];Locomotion Level;Lift;Stand;Transfers;Hygiene/Grooming    Examination-Participation Restrictions Cleaning;Community Activity;Driving;Interpersonal Relationship;Laundry;Volunteer;Meal Prep;Yard Work;Other    Stability/Clinical Decision Making Evolving/Moderate complexity    Rehab Potential Fair    PT Frequency 2x / week    PT Duration 6 weeks    PT Treatment/Interventions ADLs/Self Care Home Management;Biofeedback;Cryotherapy;Electrical Stimulation;Iontophoresis 4mg /ml Dexamethasone;Moist Heat;Traction;Ultrasound;DME Instruction;Functional mobility training;Therapeutic activities;Patient/family education;Neuromuscular re-education;Balance training;Therapeutic exercise;Manual techniques;Energy conservation;Dry needling;Passive range of motion;Taping;Vestibular;Vasopneumatic Device;Spinal Manipulations;Visual/perceptual remediation/compensation;Canalith Repostioning    PT Next Visit Plan give cervical HEP. nerve glides, mobilize spine    PT Home Exercise Plan Cervical retraction supine, seated scapular retraction, seated scapular levation    Consulted  and Agree with Plan of Care Patient           Patient will benefit from skilled therapeutic intervention in order to improve the following deficits and impairments:  Decreased activity tolerance, Decreased coordination, Decreased mobility, Decreased range of motion, Decreased strength, Hypomobility, Impaired flexibility, Impaired perceived functional ability, Increased muscle spasms, Postural dysfunction, Improper body mechanics, Pain, Impaired sensation, Impaired UE functional use  Visit Diagnosis: Cervicalgia  Abnormal posture  Muscle weakness (generalized)  Acute left-sided low back pain with sciatica, sciatica laterality unspecified     Problem List Patient Active Problem List   Diagnosis Date Noted  . PFO (patent foramen ovale)   . CAD (coronary artery disease), native coronary artery   . Acute CVA (cerebrovascular accident) ([place]) 06/15/2018  . Depression 10/16/2017  . Migraine 10/16/2017  . Hyperglycemia, unspecified 07/10/2017  . Hyperglycemia 07/10/2017  . H/O dizziness 08/02/2016  . Cervical disc disorder with radiculopathy of cervical region 04/12/2016  . Nonallopathic lesion of thoracic region 04/12/2016  . Nonallopathic lesion of rib cage 04/12/2016  . Facet arthritis of cervical region 03/21/2016  . Hyperlipidemia 01/26/2015  . Labral tear of shoulder, right, subsequent encounter 12/04/2014  . 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 Impingement syndrome of right shoulder 05/28/2014  . Right supraspinatus tenosynovitis 05/28/2014  . Arthritis pain 04/03/2014  . Fatigue 04/03/2014  . 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 IBS (irritable bowel syndrome) 06/20/2012  . Lumbar disc disease 06/20/2012   Janna Arch, PT, DPT    11/19/2019, 12:30 PM  [place] MAIN [facility] SERVICES [address] [place], [place], [address] Phone: [phone]   Fax:  [phone]  Name: GIULIA HICKEY MRN: [id number] Date of Birth:  Apr 30, 1961

## 2019-11-20 ENCOUNTER — Ambulatory Visit: Payer: 59

## 2019-11-20 ENCOUNTER — Other Ambulatory Visit: Payer: Self-pay

## 2019-11-20 DIAGNOSIS — M542 Cervicalgia: Secondary | ICD-10-CM

## 2019-11-20 DIAGNOSIS — R293 Abnormal posture: Secondary | ICD-10-CM | POA: Diagnosis not present

## 2019-11-20 DIAGNOSIS — M6281 Muscle weakness (generalized): Secondary | ICD-10-CM

## 2019-11-20 DIAGNOSIS — M5440 Lumbago with sciatica, unspecified side: Secondary | ICD-10-CM | POA: Diagnosis not present

## 2019-11-20 MED FILL — PANTOPRAZOLE SOD DR 40 MG T: 40 | 90 days supply | Qty: 90 | Fill #0

## 2019-11-20 NOTE — Therapy (Signed)
[place] MAIN [facility] SERVICES [address] [place], [place], [address] Phone: [phone]   Fax:  [phone]  Physical Therapy Treatment  Patient Details  Name: Lori Morales MRN: [id number] Date of Birth: 1960-12-29 Referring Provider (PT): Tama High III   Encounter Date: 11/20/2019   PT End of Session - 11/20/19 1717    Visit Number 8    Number of Visits 12    Date for PT Re-Evaluation 12/05/19    Authorization Type 8/10 eval 10/24/19    PT Start Time 0800    PT Stop Time 0843    PT Time Calculation (min) 43 min    Activity Tolerance Patient tolerated treatment well;No increased pain    Behavior During Therapy WFL for tasks assessed/performed           Past Medical History:  Diagnosis Date  . Arthritis   . CAD (coronary artery disease), native coronary artery    mild non calcified plaque in mid LAD and prox LCx on coronary CTA 01/2019  . Depression   . GERD (gastroesophageal reflux disease)   . Headache    migraines  . Hx of squamous cell carcinoma 07/11/2016   Right distal dorsum lat forearm   . Hypertension   . IBS (irritable bowel syndrome)   . Palpitations   . PFO (patent foramen ovale)    small by cardiac CTA 01/2019  . PONV (postoperative nausea and vomiting)   . Restless legs syndrome     Past Surgical History:  Procedure Laterality Date  . ABDOMINOPLASTY    . BACK SURGERY     1 laminectomy, 1 fusion  . COLONOSCOPY    . COLONOSCOPY WITH PROPOFOL N/A 01/17/2017   Procedure: COLONOSCOPY WITH PROPOFOL;  Surgeon: Lollie Sails, MD;  Location: [facility] ENDOSCOPY;  Service: Endoscopy;  Laterality: N/A;  . WISDOM TOOTH EXTRACTION      There were no vitals filed for this visit.   Subjective Assessment - 11/20/19 1708    Subjective Patient presents with high tension levels due work work place stress.    Pertinent History Patient is a pleasant 59 year old female with new authorization for cervical disc disorder  at C6-7 with radiculopathy. Neck pain began December 2020. Prednisone pack x3 times since December and then epidural march 3rd. PMH of hyperglycemia, elevated BP, arthritis, HLD, restless legs, chronic insomnia, prior vertiginous migraine, thalamic CVA, anxiety, CAD, depression, GERD, hx of cancer, IBS, lumbar disc disease (prior back surgery with fusion x2 L4-5, S1), and cervical disc disease. 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    Limitations Sitting;Lifting;Standing;House hold activities;Reading;Walking    How long can you sit comfortably? needs to move head or use heat pack    How long can you stand comfortably? n/a    How long can you walk comfortably? n/a    Diagnostic tests MRI:  disc bulge at C6-7    Patient Stated Goals to decrease her pain    Currently in Pain? Yes    Pain Score 3     Pain Location Neck    Pain Orientation Left;Posterior    Pain Descriptors / Indicators Aching    Pain Type Neuropathic pain;Chronic pain    Pain Onset More than a month ago    Pain Frequency Intermittent                Prone: Mobilizations: CPA/UPA grade  II-III each level C1-T11. Hypomobile and painful throughout L side; improved with glides to right in cervical region. And inferior glides with subscap region. inferior directed J mobilization to anterior aspect of kyphotic curvature  STM to upper trap, levator scap, cervical paraspinals and portions of SCMs with focus on trigger point reduction . X 18 minutes   Supine Cervical side bend with overpressure at occiput and shoulder, L and R 4x 30 seconds Cervical rotation with overpressure at occiput and shoulder L and R 4x 30 seconds  Suboccipital release 3x30 second holds Median nerve glide BUE 15x; improvements with repetition  Bicep trigger point with movement (supination pronation with extension flexion) x 5 minutes  Seated:   scapular retraction with depression mobilization  grade II with overpressure and rotation 3 minutes Scapular retractions 10x          Pt educated throughout session about proper posture and technique with exercises. Improved exercise technique, movement at target joints, use of target muscles after min to mod verbal, visual, tactile cues                        PT Education - 11/20/19 1716    Education Details exercise technique, body mechanics, manual    Person(s) Educated Patient    Methods Explanation;Demonstration;Tactile cues;Verbal cues    Comprehension Verbalized understanding;Returned demonstration;Verbal cues required;Tactile cues required            PT Short Term Goals - 10/24/19 1744      PT SHORT TERM GOAL #1   Title Patient will be independent in home exercise program to improve strength/mobility/pain relief for better functional independence with ADLs.    Baseline 5/27: give HEP next session    Time 2    Period Weeks    Status New    Target Date 11/07/19             PT Long Term Goals - 10/24/19 1745      PT LONG TERM GOAL #1   Title Patient will report a worst pain of 3/10 on VAS in cervical spine to improve tolerance with ADLs and reduced symptoms with activities.    Baseline 5/27: 9/10    Time 6    Period Weeks    Status New    Target Date 12/05/19      PT LONG TERM GOAL #2   Title Patient will increase FOTO score to equal to or greater than  53/100   to demonstrate statistically significant improvement in mobility and quality of life.    Baseline 5/27; 36/100    Time 6    Period Weeks    Status New    Target Date 12/05/19      PT LONG TERM GOAL #3   Title Patient will reduce Neck Disability Index score to <10% to demonstrate minimal disability with ADL's including improved sleeping tolerance, sitting tolerance, etc for better mobility at home and work.    Baseline 5/27: 58%    Time 6    Period Weeks    Status New    Target Date 12/05/19      PT LONG TERM GOAL #4   Title  Patient will tolerate sitting unsupported with neutral head position for 15+ minutes to demonstrate improved postural musculature strength and improved sitting tolerance.    Baseline 5/27: painful to sit at work    Time 6    Period Weeks    Status New    Target  Date 12/05/19                 Plan - 11/20/19 1721    Clinical Impression Statement Patient's posture is affected this morning with increased tension resulting in limited muscle tissue length with increasing shoulder rotation and cervical musculature involvement that is improved with prolonged holds and soft tissue techniques. Patient would benefit from skilled physical therapy to reduce pain, improve muscle tissue length and posture, and return patient to PLOF    Personal Factors and Comorbidities Age;Comorbidity 3+;Past/Current Experience;Profession;Social Background;Time since onset of injury/illness/exacerbation    Comorbidities hyperglycemia, elevated BP, arthritis, HLD, restless legs, chronic insomnia, prior vertiginous migraine, thalamic CVA, anxiety, CAD, depression, GERD, hx of cancer, IBS, lumbar disc disease (prior back surgery with fusion x2 L4-5, S1), and cervical disc disease.    Examination-Activity Limitations Bed Mobility;Bend;Caring for [employer];Locomotion Level;Lift;Stand;Transfers;Hygiene/Grooming    Examination-Participation Restrictions Cleaning;Community Activity;Driving;Interpersonal Relationship;Laundry;Volunteer;Meal Prep;Yard Work;Other    Stability/Clinical Decision Making Evolving/Moderate complexity    Rehab Potential Fair    PT Frequency 2x / week    PT Duration 6 weeks    PT Treatment/Interventions ADLs/Self Care Home Management;Biofeedback;Cryotherapy;Electrical Stimulation;Iontophoresis 4mg /ml Dexamethasone;Moist Heat;Traction;Ultrasound;DME Instruction;Functional mobility training;Therapeutic activities;Patient/family education;Neuromuscular re-education;Balance  training;Therapeutic exercise;Manual techniques;Energy conservation;Dry needling;Passive range of motion;Taping;Vestibular;Vasopneumatic Device;Spinal Manipulations;Visual/perceptual remediation/compensation;Canalith Repostioning    PT Next Visit Plan give cervical HEP. nerve glides, mobilize spine    PT Home Exercise Plan Cervical retraction supine, seated scapular retraction, seated scapular levation    Consulted and Agree with Plan of Care Patient           Patient will benefit from skilled therapeutic intervention in order to improve the following deficits and impairments:  Decreased activity tolerance, Decreased coordination, Decreased mobility, Decreased range of motion, Decreased strength, Hypomobility, Impaired flexibility, Impaired perceived functional ability, Increased muscle spasms, Postural dysfunction, Improper body mechanics, Pain, Impaired sensation, Impaired UE functional use  Visit Diagnosis: Cervicalgia  Abnormal posture  Muscle weakness (generalized)     Problem List Patient Active Problem List   Diagnosis Date Noted  . PFO (patent foramen ovale)   . CAD (coronary artery disease), native coronary artery   . Acute CVA (cerebrovascular accident) ([place]) 06/15/2018  . Depression 10/16/2017  . Migraine 10/16/2017  . Hyperglycemia, unspecified 07/10/2017  . Hyperglycemia 07/10/2017  . H/O dizziness 08/02/2016  . Cervical disc disorder with radiculopathy of cervical region 04/12/2016  . Nonallopathic lesion of thoracic region 04/12/2016  . Nonallopathic lesion of rib cage 04/12/2016  . Facet arthritis of cervical region 03/21/2016  . Hyperlipidemia 01/26/2015  . Labral tear of shoulder, right, subsequent encounter 12/04/2014  . 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 Impingement syndrome of right shoulder 05/28/2014  . Right supraspinatus tenosynovitis 05/28/2014  . Arthritis pain 04/03/2014  . Fatigue 04/03/2014  . 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 IBS  (irritable bowel syndrome) 06/20/2012  . Lumbar disc disease 06/20/2012   Janna Arch, PT, DPT    11/20/2019, 5:22 PM  [place] MAIN [facility] SERVICES [address] [place], [place], [address] Phone: [phone]   Fax:  [phone]  Name: STEFFANY SCHOENFELDER MRN: [id number] Date of Birth: May 25, 1961

## 2019-11-25 ENCOUNTER — Other Ambulatory Visit: Payer: Self-pay

## 2019-11-25 ENCOUNTER — Ambulatory Visit: Payer: 59

## 2019-11-25 DIAGNOSIS — M542 Cervicalgia: Secondary | ICD-10-CM

## 2019-11-25 DIAGNOSIS — M6281 Muscle weakness (generalized): Secondary | ICD-10-CM

## 2019-11-25 DIAGNOSIS — R293 Abnormal posture: Secondary | ICD-10-CM | POA: Diagnosis not present

## 2019-11-25 DIAGNOSIS — M5440 Lumbago with sciatica, unspecified side: Secondary | ICD-10-CM | POA: Diagnosis not present

## 2019-11-25 NOTE — Therapy (Signed)
[place] MAIN [facility] SERVICES [address] [place], [place], [address] Phone: [phone]   Fax:  [phone]  Physical Therapy Treatment  Patient Details  Name: Lori Morales MRN: [id number] Date of Birth: 03/03/1961 Referring Provider (PT): Tama High III   Encounter Date: 11/25/2019   PT End of Session - 11/25/19 1231    Visit Number 9    Number of Visits 12    Date for PT Re-Evaluation 12/05/19    Authorization Type 9/10 eval 10/24/19    PT Start Time 0847    PT Stop Time 0929    PT Time Calculation (min) 42 min    Activity Tolerance Patient tolerated treatment well;No increased pain    Behavior During Therapy WFL for tasks assessed/performed           Past Medical History:  Diagnosis Date  . Arthritis   . CAD (coronary artery disease), native coronary artery    mild non calcified plaque in mid LAD and prox LCx on coronary CTA 01/2019  . Depression   . GERD (gastroesophageal reflux disease)   . Headache    migraines  . Hx of squamous cell carcinoma 07/11/2016   Right distal dorsum lat forearm   . Hypertension   . IBS (irritable bowel syndrome)   . Palpitations   . PFO (patent foramen ovale)    small by cardiac CTA 01/2019  . PONV (postoperative nausea and vomiting)   . Restless legs syndrome     Past Surgical History:  Procedure Laterality Date  . ABDOMINOPLASTY    . BACK SURGERY     1 laminectomy, 1 fusion  . COLONOSCOPY    . COLONOSCOPY WITH PROPOFOL N/A 01/17/2017   Procedure: COLONOSCOPY WITH PROPOFOL;  Surgeon: Lollie Sails, MD;  Location: [facility] ENDOSCOPY;  Service: Endoscopy;  Laterality: N/A;  . WISDOM TOOTH EXTRACTION      There were no vitals filed for this visit.   Subjective Assessment - 11/25/19 1225    Subjective Patient reports her headaches have decreased in frequency. Has been noticing an improvement.    Pertinent History Patient is a pleasant 59 year old female with new authorization for  cervical disc disorder at C6-7 with radiculopathy. Neck pain began December 2020. Prednisone pack x3 times since December and then epidural march 3rd. PMH of hyperglycemia, elevated BP, arthritis, HLD, restless legs, chronic insomnia, prior vertiginous migraine, thalamic CVA, anxiety, CAD, depression, GERD, hx of cancer, IBS, lumbar disc disease (prior back surgery with fusion x2 L4-5, S1), and cervical disc disease. 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    Limitations Sitting;Lifting;Standing;House hold activities;Reading;Walking    How long can you sit comfortably? needs to move head or use heat pack    How long can you stand comfortably? n/a    How long can you walk comfortably? n/a    Diagnostic tests MRI:  disc bulge at C6-7    Patient Stated Goals to decrease her pain    Currently in Pain? Yes    Pain Score 3     Pain Location Neck    Pain Orientation Posterior    Pain Descriptors / Indicators Aching    Pain Type Neuropathic pain    Pain Onset More than a month ago    Pain Frequency Intermittent                 Prone: Mobilizations:  CPA/UPA grade II-III each level C1-T11. Hypomobile and painful throughout L side; improved with glides to right in cervical region. And inferior glides with subscap region. inferior directed J mobilization to anterior aspect of kyphotic curvature  STM to upper trap, levator scap, cervical paraspinals and portions of SCMs with focus on trigger point reduction . X 18 minutes   Supine Cervical side bend with overpressure at occiput and shoulder, L and R 4x 30 seconds Cervical rotation with overpressure at occiput and shoulder L and R 4x 30 seconds  Suboccipital release 3x30 second holds Median nerve glide BUE 15x; improvements with repetition  Bicep trigger point with movement (supination pronation with extension flexion) x 5 minutes   Seated:   scapular retraction with depression  mobilization grade II with overpressure and rotation 3 minutes Scapular retractions 10x   trunk extension over roller in chair with arms behind head x 10       Pt educated throughout session about proper posture and technique with exercises. Improved exercise technique, movement at target joints, use of target muscles after min to mod verbal, visual, tactile cues                      PT Education - 11/25/19 1230    Education Details exercise technique, body mechanics, manual    Person(s) Educated Patient    Methods Explanation;Demonstration;Tactile cues;Verbal cues    Comprehension Verbalized understanding;Returned demonstration;Verbal cues required;Tactile cues required            PT Short Term Goals - 10/24/19 1744      PT SHORT TERM GOAL #1   Title Patient will be independent in home exercise program to improve strength/mobility/pain relief for better functional independence with ADLs.    Baseline 5/27: give HEP next session    Time 2    Period Weeks    Status New    Target Date 11/07/19             PT Long Term Goals - 10/24/19 1745      PT LONG TERM GOAL #1   Title Patient will report a worst pain of 3/10 on VAS in cervical spine to improve tolerance with ADLs and reduced symptoms with activities.    Baseline 5/27: 9/10    Time 6    Period Weeks    Status New    Target Date 12/05/19      PT LONG TERM GOAL #2   Title Patient will increase FOTO score to equal to or greater than  53/100   to demonstrate statistically significant improvement in mobility and quality of life.    Baseline 5/27; 36/100    Time 6    Period Weeks    Status New    Target Date 12/05/19      PT LONG TERM GOAL #3   Title Patient will reduce Neck Disability Index score to <10% to demonstrate minimal disability with ADL's including improved sleeping tolerance, sitting tolerance, etc for better mobility at home and work.    Baseline 5/27: 58%    Time 6    Period Weeks     Status New    Target Date 12/05/19      PT LONG TERM GOAL #4   Title Patient will tolerate sitting unsupported with neutral head position for 15+ minutes to demonstrate improved postural musculature strength and improved sitting tolerance.    Baseline 5/27: painful to sit at work    Time 6  Period Weeks    Status New    Target Date 12/05/19                 Plan - 11/25/19 1233    Clinical Impression Statement Patient is improving with decreased muscle tissue limitations allowing for neutralized posture with decreased pain and radiating symptoms. Patient continues to have low levels of pain that are improved with manual and thererex. Patient would benefit from skilled physical therapy to reduce pain, improve muscle tissue length and posture, and return patient to PLOF    Personal Factors and Comorbidities Age;Comorbidity 3+;Past/Current Experience;Profession;Social Background;Time since onset of injury/illness/exacerbation    Comorbidities hyperglycemia, elevated BP, arthritis, HLD, restless legs, chronic insomnia, prior vertiginous migraine, thalamic CVA, anxiety, CAD, depression, GERD, hx of cancer, IBS, lumbar disc disease (prior back surgery with fusion x2 L4-5, S1), and cervical disc disease.    Examination-Activity Limitations Bed Mobility;Bend;Caring for [employer];Locomotion Level;Lift;Stand;Transfers;Hygiene/Grooming    Examination-Participation Restrictions Cleaning;Community Activity;Driving;Interpersonal Relationship;Laundry;Volunteer;Meal Prep;Yard Work;Other    Stability/Clinical Decision Making Evolving/Moderate complexity    Rehab Potential Fair    PT Frequency 2x / week    PT Duration 6 weeks    PT Treatment/Interventions ADLs/Self Care Home Management;Biofeedback;Cryotherapy;Electrical Stimulation;Iontophoresis 4mg /ml Dexamethasone;Moist Heat;Traction;Ultrasound;DME Instruction;Functional mobility training;Therapeutic  activities;Patient/family education;Neuromuscular re-education;Balance training;Therapeutic exercise;Manual techniques;Energy conservation;Dry needling;Passive range of motion;Taping;Vestibular;Vasopneumatic Device;Spinal Manipulations;Visual/perceptual remediation/compensation;Canalith Repostioning    PT Next Visit Plan give cervical HEP. nerve glides, mobilize spine    PT Home Exercise Plan Cervical retraction supine, seated scapular retraction, seated scapular levation    Consulted and Agree with Plan of Care Patient           Patient will benefit from skilled therapeutic intervention in order to improve the following deficits and impairments:  Decreased activity tolerance, Decreased coordination, Decreased mobility, Decreased range of motion, Decreased strength, Hypomobility, Impaired flexibility, Impaired perceived functional ability, Increased muscle spasms, Postural dysfunction, Improper body mechanics, Pain, Impaired sensation, Impaired UE functional use  Visit Diagnosis: Cervicalgia  Abnormal posture  Muscle weakness (generalized)  Acute left-sided low back pain with sciatica, sciatica laterality unspecified     Problem List Patient Active Problem List   Diagnosis Date Noted  . PFO (patent foramen ovale)   . CAD (coronary artery disease), native coronary artery   . Acute CVA (cerebrovascular accident) ([place]) 06/15/2018  . Depression 10/16/2017  . Migraine 10/16/2017  . Hyperglycemia, unspecified 07/10/2017  . Hyperglycemia 07/10/2017  . H/O dizziness 08/02/2016  . Cervical disc disorder with radiculopathy of cervical region 04/12/2016  . Nonallopathic lesion of thoracic region 04/12/2016  . Nonallopathic lesion of rib cage 04/12/2016  . Facet arthritis of cervical region 03/21/2016  . Hyperlipidemia 01/26/2015  . Labral tear of shoulder, right, subsequent encounter 12/04/2014  . 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 Impingement syndrome of right shoulder 05/28/2014  . Right  supraspinatus tenosynovitis 05/28/2014  . Arthritis pain 04/03/2014  . Fatigue 04/03/2014  . 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 IBS (irritable bowel syndrome) 06/20/2012  . Lumbar disc disease 06/20/2012   Janna Arch, PT, DPT   11/25/2019, 12:35 PM  [place] MAIN [facility] SERVICES [address] [place], [place], [address] Phone: [phone]   Fax:  [phone]  Name: JAHNIAH PALLAS MRN: [id number] Date of Birth: 1961-03-28

## 2019-11-26 MED FILL — PRAVASTATIN NA 40 MG TAB: 40 | 90 days supply | Qty: 90 | Fill #0

## 2019-11-27 ENCOUNTER — Ambulatory Visit: Payer: 59

## 2019-11-27 ENCOUNTER — Other Ambulatory Visit: Payer: Self-pay | Admitting: Cardiovascular Disease

## 2019-11-27 DIAGNOSIS — M50123 Cervical disc disorder at C6-C7 level with radiculopathy: Secondary | ICD-10-CM | POA: Diagnosis not present

## 2019-11-28 ENCOUNTER — Other Ambulatory Visit: Payer: Self-pay | Admitting: *Deleted

## 2019-11-28 MED ORDER — PRAVASTATIN SODIUM 40 MG PO TABS: 40.0000 mg | ORAL_TABLET | Freq: Every day | ORAL | 0 refills | Status: AC

## 2019-11-28 NOTE — Telephone Encounter (Signed)
Requested Prescriptions   Signed Prescriptions Disp Refills   pravastatin (PRAVACHOL) 40 MG tablet 30 tablet 0    Sig: Take 1 tablet (40 mg total) by mouth daily. Please call to schedule office visit for further refills. Thank you!    Authorizing Provider: Kathlyn Sacramento A    Ordering User: Britt Bottom

## 2019-11-29 ENCOUNTER — Other Ambulatory Visit: Payer: Self-pay

## 2019-11-29 ENCOUNTER — Ambulatory Visit: Payer: 59 | Attending: Internal Medicine

## 2019-11-29 DIAGNOSIS — R293 Abnormal posture: Secondary | ICD-10-CM | POA: Diagnosis not present

## 2019-11-29 DIAGNOSIS — M6281 Muscle weakness (generalized): Secondary | ICD-10-CM | POA: Diagnosis not present

## 2019-11-29 DIAGNOSIS — M542 Cervicalgia: Secondary | ICD-10-CM | POA: Diagnosis not present

## 2019-11-29 DIAGNOSIS — M5440 Lumbago with sciatica, unspecified side: Secondary | ICD-10-CM | POA: Diagnosis not present

## 2019-11-29 NOTE — Therapy (Signed)
[place] MAIN [facility] SERVICES [address] [place], [place], [address] Phone: [phone]   Fax:  [phone]  Physical Therapy Treatment Physical Therapy Progress Note   Dates of reporting period  10/24/19   to   11/29/19  Patient Details  Name: Lori Morales MRN: [id number] Date of Birth: 10-11-1960 Referring Provider (PT): Tama High III   Encounter Date: 11/29/2019   PT End of Session - 11/29/19 0859    Visit Number 10    Number of Visits 12    Date for PT Re-Evaluation 12/05/19    Authorization Type 10/10 eval 10/24/19; next session 1/10 PN 11/29/19    PT Start Time 0802    PT Stop Time 0846    PT Time Calculation (min) 44 min    Activity Tolerance Patient tolerated treatment well;No increased pain    Behavior During Therapy WFL for tasks assessed/performed           Past Medical History:  Diagnosis Date  . Arthritis   . CAD (coronary artery disease), native coronary artery    mild non calcified plaque in mid LAD and prox LCx on coronary CTA 01/2019  . Depression   . GERD (gastroesophageal reflux disease)   . Headache    migraines  . Hx of squamous cell carcinoma 07/11/2016   Right distal dorsum lat forearm   . Hypertension   . IBS (irritable bowel syndrome)   . Palpitations   . PFO (patent foramen ovale)    small by cardiac CTA 01/2019  . PONV (postoperative nausea and vomiting)   . Restless legs syndrome     Past Surgical History:  Procedure Laterality Date  . ABDOMINOPLASTY    . BACK SURGERY     1 laminectomy, 1 fusion  . COLONOSCOPY    . COLONOSCOPY WITH PROPOFOL N/A 01/17/2017   Procedure: COLONOSCOPY WITH PROPOFOL;  Surgeon: Lollie Sails, MD;  Location: [facility] ENDOSCOPY;  Service: Endoscopy;  Laterality: N/A;  . WISDOM TOOTH EXTRACTION      There were no vitals filed for this visit.   Subjective Assessment - 11/29/19 0856    Subjective Patient met with surgeon this week and is going to have surgery the  19th for an artificial disc C6-7. Patient is 50% to where she wants to be.    Pertinent History Patient is a pleasant 59 year old female with new authorization for cervical disc disorder at C6-7 with radiculopathy. Neck pain began December 2020. Prednisone pack x3 times since December and then epidural march 3rd. PMH of hyperglycemia, elevated BP, arthritis, HLD, restless legs, chronic insomnia, prior vertiginous migraine, thalamic CVA, anxiety, CAD, depression, GERD, hx of cancer, IBS, lumbar disc disease (prior back surgery with fusion x2 L4-5, S1), and cervical disc disease. 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    Limitations Sitting;Lifting;Standing;House hold activities;Reading;Walking    How long can you sit comfortably? needs to move head or use heat pack    How long can you stand comfortably? n/a    How long can you walk comfortably? n/a    Diagnostic tests MRI:  disc bulge at C6-7    Patient Stated Goals to decrease her pain    Currently in Pain? Yes    Pain Score 3     Pain Location Neck    Pain Orientation Posterior    Pain Descriptors / Indicators Aching  Pain Type Neuropathic pain    Pain Onset More than a month ago    Pain Frequency Intermittent             Patient met with surgeon this week and is going to have surgery the 19th for an artificial disc C6-7. Patient is 50% to where she wants to be.      Progress note Goals VAS: 3/10 current pain ; worst pain 4-5/10 FOTO :46.33  NDI: 52% Sitting 15 minutes: tolerates with increasing pain and increasing forward head     Treatment:    Prone: Mobilizations: CPA/UPA grade II-III each level C1-T11. Hypomobile and painful throughout L side; improved with glides to right in cervical region. And inferior glides with subscap region. inferior directed J mobilization to anterior aspect of kyphotic curvature  STM to upper trap, levator scap, cervical paraspinals and  portions of SCMs with focus on trigger point reduction . X 18 minutes   Supine Cervical side bend with overpressure at occiput and shoulder, L and R 4x 30 seconds Cervical rotation with overpressure at occiput and shoulder L and R 4x 30 seconds  Suboccipital release 3x30 second holds Median nerve glide BUE 15x; improvements with repetition      Pt educated throughout session about proper posture and technique with exercises. Improved exercise technique, movement at target joints, use of target muscles after min to mod verbal, visual, tactile cues   Patient's condition has the potential to improve in response to therapy. Maximum improvement is yet to be obtained. The anticipated improvement is attainable and reasonable in a generally predictable time.  Patient reports she is much better than she was but is still limited in her activities. Her pain is much better but still is affecting quality of life.                       PT Education - 11/29/19 0857    Education Details goals, manual, POC    Person(s) Educated Patient    Methods Explanation;Demonstration;Tactile cues;Verbal cues    Comprehension Verbalized understanding;Returned demonstration;Verbal cues required;Tactile cues required            PT Short Term Goals - 11/29/19 0859      PT SHORT TERM GOAL #1   Title Patient will be independent in home exercise program to improve strength/mobility/pain relief for better functional independence with ADLs.    Baseline 5/27: give HEP next session 7/2; HEP compliant    Time 2    Period Weeks    Status Achieved    Target Date 11/07/19             PT Long Term Goals - 11/29/19 0900      PT LONG TERM GOAL #1   Title Patient will report a worst pain of 3/10 on VAS in cervical spine to improve tolerance with ADLs and reduced symptoms with activities.    Baseline 5/27: 9/10 7/2: 4-5/10    Time 6    Period Weeks    Status Partially Met    Target Date 12/05/19       PT LONG TERM GOAL #2   Title Patient will increase FOTO score to equal to or greater than  53/100   to demonstrate statistically significant improvement in mobility and quality of life.    Baseline 5/27; 36/100 7/2: 46.33    Time 6    Period Weeks    Status Partially Met    Target Date 12/05/19  PT LONG TERM GOAL #3   Title Patient will reduce Neck Disability Index score to <10% to demonstrate minimal disability with ADL's including improved sleeping tolerance, sitting tolerance, etc for better mobility at home and work.    Baseline 5/27: 58% 7/2: 52%    Time 6    Period Weeks    Status Partially Met    Target Date 12/05/19      PT LONG TERM GOAL #4   Title Patient will tolerate sitting unsupported with neutral head position for 15+ minutes to demonstrate improved postural musculature strength and improved sitting tolerance.    Baseline 5/27: painful to sit at work 7/2; can tolerate but has increasing forward head rounded shoulders positioning    Time 6    Period Weeks    Status Partially Met    Target Date 12/05/19                 Plan - 11/29/19 7096    Clinical Impression Statement Patient is making excellent progress towards functional goals at this time. Her pain levels have decreased remarkably and FOTO has improved significantly. Muscle tissue length continues to be limited due to high guarding and postural abnormalities.  Patient's condition has the potential to improve in response to therapy. Maximum improvement is yet to be obtained. The anticipated improvement is attainable and reasonable in a generally predictable time.Patient would benefit from skilled physical therapy to reduce pain, improve muscle tissue length and posture, and return patient to PLOF    Personal Factors and Comorbidities Age;Comorbidity 3+;Past/Current Experience;Profession;Social Background;Time since onset of injury/illness/exacerbation    Comorbidities hyperglycemia, elevated BP,  arthritis, HLD, restless legs, chronic insomnia, prior vertiginous migraine, thalamic CVA, anxiety, CAD, depression, GERD, hx of cancer, IBS, lumbar disc disease (prior back surgery with fusion x2 L4-5, S1), and cervical disc disease.    Examination-Activity Limitations Bed Mobility;Bend;Caring for [employer];Locomotion Level;Lift;Stand;Transfers;Hygiene/Grooming    Examination-Participation Restrictions Cleaning;Community Activity;Driving;Interpersonal Relationship;Laundry;Volunteer;Meal Prep;Yard Work;Other    Stability/Clinical Decision Making Evolving/Moderate complexity    Rehab Potential Fair    PT Frequency 2x / week    PT Duration 6 weeks    PT Treatment/Interventions ADLs/Self Care Home Management;Biofeedback;Cryotherapy;Electrical Stimulation;Iontophoresis 73m/ml Dexamethasone;Moist Heat;Traction;Ultrasound;DME Instruction;Functional mobility training;Therapeutic activities;Patient/family education;Neuromuscular re-education;Balance training;Therapeutic exercise;Manual techniques;Energy conservation;Dry needling;Passive range of motion;Taping;Vestibular;Vasopneumatic Device;Spinal Manipulations;Visual/perceptual remediation/compensation;Canalith Repostioning    PT Next Visit Plan give cervical HEP. nerve glides, mobilize spine    PT Home Exercise Plan Cervical retraction supine, seated scapular retraction, seated scapular levation    Consulted and Agree with Plan of Care Patient           Patient will benefit from skilled therapeutic intervention in order to improve the following deficits and impairments:  Decreased activity tolerance, Decreased coordination, Decreased mobility, Decreased range of motion, Decreased strength, Hypomobility, Impaired flexibility, Impaired perceived functional ability, Increased muscle spasms, Postural dysfunction, Improper body mechanics, Pain, Impaired sensation, Impaired UE functional use  Visit  Diagnosis: Cervicalgia  Abnormal posture  Muscle weakness (generalized)     Problem List Patient Active Problem List   Diagnosis Date Noted  . PFO (patent foramen ovale)   . CAD (coronary artery disease), native coronary artery   . [place]  . Depression 10/16/2017  . Migraine 10/16/2017  . Hyperglycemia, unspecified 07/10/2017  . Hyperglycemia 07/10/2017  . H/O dizziness 08/02/2016  . Cervical disc disorder with radiculopathy of cervical region 04/12/2016  . Nonallopathic lesion of thoracic region 04/12/2016  . Nonallopathic lesion of rib cage 04/12/2016  .  Facet arthritis of cervical region 03/21/2016  . Hyperlipidemia 01/26/2015  . Labral tear of shoulder, right, subsequent encounter 12/04/2014  . 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 Impingement syndrome of right shoulder 05/28/2014  . Right supraspinatus tenosynovitis 05/28/2014  . Arthritis pain 04/03/2014  . Fatigue 04/03/2014  . 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 IBS (irritable bowel syndrome) 06/20/2012  . Lumbar disc disease 06/20/2012   Janna Arch, PT, DPT   11/29/2019, 9:04 AM  [place] MAIN [facility] SERVICES [address] [place], [place], [address] Phone: [phone]   Fax:  [phone]  Name: Lori Morales MRN: [id number] Date of Birth: Apr 14, 1961

## 2019-12-04 ENCOUNTER — Other Ambulatory Visit: Payer: Self-pay

## 2019-12-04 ENCOUNTER — Ambulatory Visit: Payer: 59

## 2019-12-04 DIAGNOSIS — M6281 Muscle weakness (generalized): Secondary | ICD-10-CM

## 2019-12-04 DIAGNOSIS — M5440 Lumbago with sciatica, unspecified side: Secondary | ICD-10-CM | POA: Diagnosis not present

## 2019-12-04 DIAGNOSIS — R293 Abnormal posture: Secondary | ICD-10-CM | POA: Diagnosis not present

## 2019-12-04 DIAGNOSIS — M542 Cervicalgia: Secondary | ICD-10-CM

## 2019-12-04 NOTE — Therapy (Signed)
[place] MAIN [facility] SERVICES [address] [place], [place], [address] Phone: [phone]   Fax:  [phone]  Physical Therapy Treatment  Patient Details  Name: Lori Morales MRN: [id number] Date of Birth: 10/22/1960 Referring Provider (PT): Tama High III   Encounter Date: 12/04/2019   PT End of Session - 12/04/19 0850    Visit Number 11    Number of Visits 12    Date for PT Re-Evaluation 12/05/19    Authorization Type 1/10 PN 11/29/19    PT Start Time 0805    PT Stop Time 0845    PT Time Calculation (min) 40 min    Activity Tolerance Patient tolerated treatment well;No increased pain    Behavior During Therapy WFL for tasks assessed/performed           Past Medical History:  Diagnosis Date  . Arthritis   . CAD (coronary artery disease), native coronary artery    mild non calcified plaque in mid LAD and prox LCx on coronary CTA 01/2019  . Depression   . GERD (gastroesophageal reflux disease)   . Headache    migraines  . Hx of squamous cell carcinoma 07/11/2016   Right distal dorsum lat forearm   . Hypertension   . IBS (irritable bowel syndrome)   . Palpitations   . PFO (patent foramen ovale)    small by cardiac CTA 01/2019  . PONV (postoperative nausea and vomiting)   . Restless legs syndrome     Past Surgical History:  Procedure Laterality Date  . ABDOMINOPLASTY    . BACK SURGERY     1 laminectomy, 1 fusion  . COLONOSCOPY    . COLONOSCOPY WITH PROPOFOL N/A 01/17/2017   Procedure: COLONOSCOPY WITH PROPOFOL;  Surgeon: Lollie Sails, MD;  Location: [facility] ENDOSCOPY;  Service: Endoscopy;  Laterality: N/A;  . WISDOM TOOTH EXTRACTION      There were no vitals filed for this visit.   Subjective Assessment - 12/04/19 0848    Subjective Patient reports she had a stressful weekend, has been compliant with her HEP.    Pertinent History Patient is a pleasant 59 year old female with new authorization for cervical disc  disorder at C6-7 with radiculopathy. Neck pain began December 2020. Prednisone pack x3 times since December and then epidural march 3rd. PMH of hyperglycemia, elevated BP, arthritis, HLD, restless legs, chronic insomnia, prior vertiginous migraine, thalamic CVA, anxiety, CAD, depression, GERD, hx of cancer, IBS, lumbar disc disease (prior back surgery with fusion x2 L4-5, S1), and cervical disc disease. 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    Limitations Sitting;Lifting;Standing;House hold activities;Reading;Walking    How long can you sit comfortably? needs to move head or use heat pack    How long can you stand comfortably? n/a    How long can you walk comfortably? n/a    Diagnostic tests MRI:  disc bulge at C6-7    Patient Stated Goals to decrease her pain    Currently in Pain? Yes    Pain Score 2     Pain Location Neck    Pain Orientation Posterior    Pain Descriptors / Indicators Aching;Guarding    Pain Type Neuropathic pain    Pain Onset More than a month ago    Pain Frequency Intermittent               Prone: Mobilizations: CPA/UPA grade  II-III each level C1-T11. Hypomobile and painful throughout L side; improved with glides to right in cervical region. And inferior glides with subscap region. inferior directed J mobilization to anterior aspect of kyphotic curvature  STM to upper trap, levator scap, cervical paraspinals and portions of SCMs with focus on trigger point reduction . X 18 minutes   Supine Cervical side bend with overpressure at occiput and shoulder, L and R 4x 30 seconds Cervical rotation with overpressure at occiput and shoulder L and R 4x 30 seconds  Suboccipital release 3x30 second holds Median nerve glide BUE 15x; improvements with repetition  Bicep trigger point with movement (supination pronation with extension flexion) x 6 minutes  first rib inferior mobilizations 10x 8 second grade I-II  glides  Seated:   scapular retraction with depression mobilization grade II with overpressure and rotation 3 minutes        Pt educated throughout session about proper posture and technique with exercises. Improved exercise technique, movement at target joints, use of target muscles after min to mod verbal, visual, tactile cues                         PT Education - 12/04/19 0850    Education Details exercise technique, manual    Person(s) Educated Patient    Methods Explanation;Demonstration;Tactile cues;Verbal cues    Comprehension Verbalized understanding;Returned demonstration;Verbal cues required;Tactile cues required            PT Short Term Goals - 11/29/19 0859      PT SHORT TERM GOAL #1   Title Patient will be independent in home exercise program to improve strength/mobility/pain relief for better functional independence with ADLs.    Baseline 5/27: give HEP next session 7/2; HEP compliant    Time 2    Period Weeks    Status Achieved    Target Date 11/07/19             PT Long Term Goals - 11/29/19 0900      PT LONG TERM GOAL #1   Title Patient will report a worst pain of 3/10 on VAS in cervical spine to improve tolerance with ADLs and reduced symptoms with activities.    Baseline 5/27: 9/10 7/2: 4-5/10    Time 6    Period Weeks    Status Partially Met    Target Date 12/05/19      PT LONG TERM GOAL #2   Title Patient will increase FOTO score to equal to or greater than  53/100   to demonstrate statistically significant improvement in mobility and quality of life.    Baseline 5/27; 36/100 7/2: 46.33    Time 6    Period Weeks    Status Partially Met    Target Date 12/05/19      PT LONG TERM GOAL #3   Title Patient will reduce Neck Disability Index score to <10% to demonstrate minimal disability with ADL's including improved sleeping tolerance, sitting tolerance, etc for better mobility at home and work.    Baseline 5/27: 58% 7/2: 52%     Time 6    Period Weeks    Status Partially Met    Target Date 12/05/19      PT LONG TERM GOAL #4   Title Patient will tolerate sitting unsupported with neutral head position for 15+ minutes to demonstrate improved postural musculature strength and improved sitting tolerance.    Baseline 5/27: painful to sit at work 7/2; can  tolerate but has increasing forward head rounded shoulders positioning    Time 6    Period Weeks    Status Partially Met    Target Date 12/05/19                 Plan - 12/04/19 1038    Clinical Impression Statement Patient presents with multiple trigger point regions with high muscle guarding in upper trap, levator trap, and bicep regions requiring extensive manual for release to neutralize posture and reduce discomfort. Next session will be recert for length of time until surgery. Patient would benefit from skilled physical therapy to reduce pain, improve muscle tissue length and posture, and return patient to PLOF    Personal Factors and Comorbidities Age;Comorbidity 3+;Past/Current Experience;Profession;Social Background;Time since onset of injury/illness/exacerbation    Comorbidities hyperglycemia, elevated BP, arthritis, HLD, restless legs, chronic insomnia, prior vertiginous migraine, thalamic CVA, anxiety, CAD, depression, GERD, hx of cancer, IBS, lumbar disc disease (prior back surgery with fusion x2 L4-5, S1), and cervical disc disease.    Examination-Activity Limitations Bed Mobility;Bend;Caring for [employer];Locomotion Level;Lift;Stand;Transfers;Hygiene/Grooming    Examination-Participation Restrictions Cleaning;Community Activity;Driving;Interpersonal Relationship;Laundry;Volunteer;Meal Prep;Yard Work;Other    Stability/Clinical Decision Making Evolving/Moderate complexity    Rehab Potential Fair    PT Frequency 2x / week    PT Duration 6 weeks    PT Treatment/Interventions ADLs/Self Care Home  Management;Biofeedback;Cryotherapy;Electrical Stimulation;Iontophoresis 84m/ml Dexamethasone;Moist Heat;Traction;Ultrasound;DME Instruction;Functional mobility training;Therapeutic activities;Patient/family education;Neuromuscular re-education;Balance training;Therapeutic exercise;Manual techniques;Energy conservation;Dry needling;Passive range of motion;Taping;Vestibular;Vasopneumatic Device;Spinal Manipulations;Visual/perceptual remediation/compensation;Canalith Repostioning    PT Next Visit Plan give cervical HEP. nerve glides, mobilize spine    PT Home Exercise Plan Cervical retraction supine, seated scapular retraction, seated scapular levation    Consulted and Agree with Plan of Care Patient           Patient will benefit from skilled therapeutic intervention in order to improve the following deficits and impairments:  Decreased activity tolerance, Decreased coordination, Decreased mobility, Decreased range of motion, Decreased strength, Hypomobility, Impaired flexibility, Impaired perceived functional ability, Increased muscle spasms, Postural dysfunction, Improper body mechanics, Pain, Impaired sensation, Impaired UE functional use  Visit Diagnosis: Cervicalgia  Abnormal posture  Muscle weakness (generalized)     Problem List Patient Active Problem List   Diagnosis Date Noted  . PFO (patent foramen ovale)   . CAD (coronary artery disease), native coronary artery   . [place]  . Depression 10/16/2017  . Migraine 10/16/2017  . Hyperglycemia, unspecified 07/10/2017  . Hyperglycemia 07/10/2017  . H/O dizziness 08/02/2016  . Cervical disc disorder with radiculopathy of cervical region 04/12/2016  . Nonallopathic lesion of thoracic region 04/12/2016  . Nonallopathic lesion of rib cage 04/12/2016  . Facet arthritis of cervical region 03/21/2016  . Hyperlipidemia 01/26/2015  . Labral tear of shoulder, right, subsequent encounter  12/04/2014  . 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 Impingement syndrome of right shoulder 05/28/2014  . Right supraspinatus tenosynovitis 05/28/2014  . Arthritis pain 04/03/2014  . Fatigue 04/03/2014  . 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 IBS (irritable bowel syndrome) 06/20/2012  . Lumbar disc disease 06/20/2012   MJanna Arch PT, DPT   12/04/2019, 10:38 AM  [place] [facility] [address] [address]Phone: [phone]  Fax:  [phone] Name: MAARIEL EMSMRN: [id number] of Birth: [birth year]

## 2019-12-05 ENCOUNTER — Other Ambulatory Visit: Payer: Self-pay | Admitting: Nurse Practitioner

## 2019-12-06 ENCOUNTER — Other Ambulatory Visit: Payer: Self-pay

## 2019-12-06 ENCOUNTER — Ambulatory Visit: Payer: 59

## 2019-12-06 DIAGNOSIS — M6281 Muscle weakness (generalized): Secondary | ICD-10-CM

## 2019-12-06 DIAGNOSIS — M5440 Lumbago with sciatica, unspecified side: Secondary | ICD-10-CM | POA: Diagnosis not present

## 2019-12-06 DIAGNOSIS — R293 Abnormal posture: Secondary | ICD-10-CM

## 2019-12-06 DIAGNOSIS — M542 Cervicalgia: Secondary | ICD-10-CM | POA: Diagnosis not present

## 2019-12-06 NOTE — Therapy (Signed)
[place] MAIN [facility] SERVICES [address] [place], [place], [address] Phone: [phone]   Fax:  [phone]  Physical Therapy Treatment/RECERT  Patient Details  Name: Lori Morales MRN: [id number] Date of Birth: [birth year] Referring Provider (PT): Tama High III   Encounter Date: 12/06/2019   PT End of Session - 12/06/19 1133    Visit Number 12    Number of Visits 24    Date for PT Re-Evaluation 01/17/20    Authorization Type 2/10 PN 11/29/19    PT Start Time 0947    PT Stop Time 1031    PT Time Calculation (min) 44 min    Activity Tolerance Patient tolerated treatment well;No increased pain    Behavior During Therapy WFL for tasks assessed/performed           Past Medical History:  Diagnosis Date  . Arthritis   . CAD (coronary artery disease), native coronary artery    mild non calcified plaque in mid LAD and prox LCx on coronary CTA 01/2019  . Depression   . GERD (gastroesophageal reflux disease)   . Headache    migraines  . Hx of squamous cell carcinoma 07/11/2016   Right distal dorsum lat forearm   . Hypertension   . IBS (irritable bowel syndrome)   . Palpitations   . PFO (patent foramen ovale)    small by cardiac CTA 01/2019  . PONV (postoperative nausea and vomiting)   . Restless legs syndrome     Past Surgical History:  Procedure Laterality Date  . ABDOMINOPLASTY    . BACK SURGERY     1 laminectomy, 1 fusion  . COLONOSCOPY    . COLONOSCOPY WITH PROPOFOL N/A 01/17/2017   Procedure: COLONOSCOPY WITH PROPOFOL;  Surgeon: Lollie Sails, MD;  Location: [facility] ENDOSCOPY;  Service: Endoscopy;  Laterality: N/A;  . WISDOM TOOTH EXTRACTION      There were no vitals filed for this visit.   Subjective Assessment - 12/06/19 1131    Subjective Patient reports tension stress from higher workload due to increased demand to get ready for surgery.    Pertinent History Patient is a pleasant 59 year old female with new  authorization for cervical disc disorder at C6-7 with radiculopathy. Neck pain began December 2020. Prednisone pack x3 times since December and then epidural march 3rd. PMH of hyperglycemia, elevated BP, arthritis, HLD, restless legs, chronic insomnia, prior vertiginous migraine, thalamic CVA, anxiety, CAD, depression, GERD, hx of cancer, IBS, lumbar disc disease (prior back surgery with fusion x2 L4-5, S1), and cervical disc disease. 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    Limitations Sitting;Lifting;Standing;House hold activities;Reading;Walking    How long can you sit comfortably? needs to move head or use heat pack    How long can you stand comfortably? n/a    How long can you walk comfortably? n/a    Diagnostic tests MRI:  disc bulge at C6-7    Patient Stated Goals to decrease her pain    Currently in Pain? Yes    Pain Score 2     Pain Location Neck    Pain Orientation Posterior    Pain Descriptors / Indicators Aching    Pain Type Neuropathic pain    Pain Onset More than a month ago    Pain Frequency Intermittent  goals performed on 7/2 (two sessions previous) please refer to this note for progression. Will recert for time remaining prior to surgery, upon which patient will be discharged.        Prone: Mobilizations: CPA/UPA grade II-III each level C1-T11. Hypomobile and painful throughout L side; improved with glides to right in cervical region. And inferior glides with subscap region. inferior directed J mobilization to anterior aspect of kyphotic curvature  STM to upper trap, levator scap, cervical paraspinals and portions of SCMs with focus on trigger point reduction . X 18 minutes   Supine Cervical side bend with overpressure at occiput and shoulder, L and R 4x 30 seconds Cervical rotation with overpressure at occiput and shoulder L and R 4x 30 seconds  Suboccipital release 3x30 second  holds Median nerve glide BUE 15x; improvements with repetition  Bicep trigger point with movement (supination pronation with extension flexion) x 6 minutes  first rib inferior mobilizations 10x 8 second grade I-II glides    Sidelying: Rib inferior glide R for pain reduction 2x 30 second glides  Seated:   scapular retraction with depression mobilization grade II with overpressure and rotation 3 minutes   K tape to distal two ribs on R into inferior glide for pain reduction.        Pt educated throughout session about proper posture and technique with exercises. Improved exercise technique, movement at target joints, use of target muscles after min to mod verbal, visual, tactile cues      Goals performed on 7/2 (two sessions previous) please refer to this note for progression. Will recert for time remaining prior to surgery, upon which patient will be discharged. Patient presents with multiple trigger point regions with high muscle guarding in upper trap, levator trap, and bicep regions requiring extensive manual for release to neutralize posture and reduce discomfort.Patient would benefit from skilled physical therapy to reduce pain, improve muscle tissue length and posture, and return patient to PLOF           PT Education - 12/06/19 1132    Education Details exercise technique, body mechanics, POC    Person(s) Educated Patient    Methods Explanation;Demonstration;Tactile cues;Verbal cues    Comprehension Verbalized understanding;Returned demonstration;Verbal cues required;Tactile cues required            PT Short Term Goals - 11/29/19 0859      PT SHORT TERM GOAL #1   Title Patient will be independent in home exercise program to improve strength/mobility/pain relief for better functional independence with ADLs.    Baseline 5/27: give HEP next session 7/2; HEP compliant    Time 2    Period Weeks    Status Achieved    Target Date 11/07/19             PT Long Term  Goals - 12/06/19 1138      PT LONG TERM GOAL #1   Title Patient will report a worst pain of 3/10 on VAS in cervical spine to improve tolerance with ADLs and reduced symptoms with activities.    Baseline 5/27: 9/10 7/2: 4-5/10    Time 6    Period Weeks    Status Partially Met    Target Date 01/17/20      PT LONG TERM GOAL #2   Title Patient will increase FOTO score to equal to or greater than  53/100   to demonstrate statistically significant improvement in mobility and quality of life.    Baseline 5/27; 36/100 7/2: 46.33  Time 6    Period Weeks    Status Partially Met    Target Date 01/17/20      PT LONG TERM GOAL #3   Title Patient will reduce Neck Disability Index score to <10% to demonstrate minimal disability with ADL's including improved sleeping tolerance, sitting tolerance, etc for better mobility at home and work.    Baseline 5/27: 58% 7/2: 52%    Time 6    Period Weeks    Status Partially Met    Target Date 01/17/20      PT LONG TERM GOAL #4   Title Patient will tolerate sitting unsupported with neutral head position for 15+ minutes to demonstrate improved postural musculature strength and improved sitting tolerance.    Baseline 5/27: painful to sit at work 7/2; can tolerate but has increasing forward head rounded shoulders positioning    Time 6    Period Weeks    Status Partially Met    Target Date 01/17/20                 Plan - 12/06/19 1136    Clinical Impression Statement Goals performed on 7/2 (two sessions previous) please refer to this note for progression. Will recert for time remaining prior to surgery, upon which patient will be discharged. Patient presents with multiple trigger point regions with high muscle guarding in upper trap, levator trap, and bicep regions requiring extensive manual for release to neutralize posture and reduce discomfort.Patient would benefit from skilled physical therapy to reduce pain, improve muscle tissue length and  posture, and return patient to PLOF    Personal Factors and Comorbidities Age;Comorbidity 3+;Past/Current Experience;Profession;Social Background;Time since onset of injury/illness/exacerbation    Comorbidities hyperglycemia, elevated BP, arthritis, HLD, restless legs, chronic insomnia, prior vertiginous migraine, thalamic CVA, anxiety, CAD, depression, GERD, hx of cancer, IBS, lumbar disc disease (prior back surgery with fusion x2 L4-5, S1), and cervical disc disease.    Examination-Activity Limitations Bed Mobility;Bend;Caring for [employer];Locomotion Level;Lift;Stand;Transfers;Hygiene/Grooming    Examination-Participation Restrictions Cleaning;Community Activity;Driving;Interpersonal Relationship;Laundry;Volunteer;Meal Prep;Yard Work;Other    Stability/Clinical Decision Making Evolving/Moderate complexity    Rehab Potential Fair    PT Frequency 2x / week    PT Duration 6 weeks    PT Treatment/Interventions ADLs/Self Care Home Management;Biofeedback;Cryotherapy;Electrical Stimulation;Iontophoresis 60m/ml Dexamethasone;Moist Heat;Traction;Ultrasound;DME Instruction;Functional mobility training;Therapeutic activities;Patient/family education;Neuromuscular re-education;Balance training;Therapeutic exercise;Manual techniques;Energy conservation;Dry needling;Passive range of motion;Taping;Vestibular;Vasopneumatic Device;Spinal Manipulations;Visual/perceptual remediation/compensation;Canalith Repostioning    PT Next Visit Plan give cervical HEP. nerve glides, mobilize spine    PT Home Exercise Plan Cervical retraction supine, seated scapular retraction, seated scapular levation    Consulted and Agree with Plan of Care Patient           Patient will benefit from skilled therapeutic intervention in order to improve the following deficits and impairments:  Decreased activity tolerance, Decreased coordination, Decreased mobility, Decreased range of motion, Decreased  strength, Hypomobility, Impaired flexibility, Impaired perceived functional ability, Increased muscle spasms, Postural dysfunction, Improper body mechanics, Pain, Impaired sensation, Impaired UE functional use  Visit Diagnosis: Cervicalgia  Abnormal posture  Muscle weakness (generalized)     Problem List Patient Active Problem List   Diagnosis Date Noted  . PFO (patent foramen ovale)   . CAD (coronary artery disease), native coronary artery   . [place]) [place] [place]  . Depression 10/16/2017  . Migraine 10/16/2017  . Hyperglycemia, unspecified 07/10/2017  . Hyperglycemia 07/10/2017  . H/O dizziness 08/02/2016  . Cervical disc disorder with radiculopathy of cervical region 04/12/2016  .  Nonallopathic lesion of thoracic region 04/12/2016  . Nonallopathic lesion of rib cage 04/12/2016  . Facet arthritis of cervical region 03/21/2016  . Hyperlipidemia 01/26/2015  . Labral tear of shoulder, right, subsequent encounter 12/04/2014  . 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 Impingement syndrome of right shoulder 05/28/2014  . Right supraspinatus tenosynovitis 05/28/2014  . Arthritis pain 04/03/2014  . Fatigue 04/03/2014  . 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 IBS (irritable bowel syndrome) 06/20/2012  . Lumbar disc disease 06/20/2012   Janna Arch, PT, DPT   12/06/2019, 11:39 AM  [place] MAIN [facility] SERVICES [address] [place], [place], [address] Phone: [phone]   Fax:  [phone]  Name: Lori Morales MRN: [id number] Date of Birth: 1961-04-07

## 2019-12-09 ENCOUNTER — Other Ambulatory Visit: Payer: Self-pay

## 2019-12-09 ENCOUNTER — Ambulatory Visit: Payer: 59

## 2019-12-09 DIAGNOSIS — M5440 Lumbago with sciatica, unspecified side: Secondary | ICD-10-CM

## 2019-12-09 DIAGNOSIS — R293 Abnormal posture: Secondary | ICD-10-CM | POA: Diagnosis not present

## 2019-12-09 DIAGNOSIS — M6281 Muscle weakness (generalized): Secondary | ICD-10-CM | POA: Diagnosis not present

## 2019-12-09 DIAGNOSIS — M542 Cervicalgia: Secondary | ICD-10-CM

## 2019-12-09 NOTE — Therapy (Signed)
[place] MAIN [facility] SERVICES [address] [place], [place], [address] Phone: [phone]   Fax:  [phone]  Physical Therapy Treatment  Patient Details  Name: Lori Morales MRN: [id number] Date of Birth: 12/05/1960 Referring Provider (PT): Tama High III   Encounter Date: 12/09/2019   PT End of Session - 12/09/19 0954    Visit Number 13    Number of Visits 24    Date for PT Re-Evaluation 01/17/20    Authorization Type 3/10 PN 11/29/19    PT Start Time 0808    PT Stop Time 0846    PT Time Calculation (min) 38 min    Activity Tolerance Patient tolerated treatment well;No increased pain    Behavior During Therapy WFL for tasks assessed/performed           Past Medical History:  Diagnosis Date  . Arthritis   . CAD (coronary artery disease), native coronary artery    mild non calcified plaque in mid LAD and prox LCx on coronary CTA 01/2019  . Depression   . GERD (gastroesophageal reflux disease)   . Headache    migraines  . Hx of squamous cell carcinoma 07/11/2016   Right distal dorsum lat forearm   . Hypertension   . IBS (irritable bowel syndrome)   . Palpitations   . PFO (patent foramen ovale)    small by cardiac CTA 01/2019  . PONV (postoperative nausea and vomiting)   . Restless legs syndrome     Past Surgical History:  Procedure Laterality Date  . ABDOMINOPLASTY    . BACK SURGERY     1 laminectomy, 1 fusion  . COLONOSCOPY    . COLONOSCOPY WITH PROPOFOL N/A 01/17/2017   Procedure: COLONOSCOPY WITH PROPOFOL;  Surgeon: Lollie Sails, MD;  Location: [facility] ENDOSCOPY;  Service: Endoscopy;  Laterality: N/A;  . WISDOM TOOTH EXTRACTION      There were no vitals filed for this visit.   Subjective Assessment - 12/09/19 0952    Subjective Patient is slightly late due to traffic. Had diffuse left sided pain since waking up.    Pertinent History Patient is a pleasant 59 year old female with new authorization for cervical  disc disorder at C6-7 with radiculopathy. Neck pain began December 2020. Prednisone pack x3 times since December and then epidural march 3rd. PMH of hyperglycemia, elevated BP, arthritis, HLD, restless legs, chronic insomnia, prior vertiginous migraine, thalamic CVA, anxiety, CAD, depression, GERD, hx of cancer, IBS, lumbar disc disease (prior back surgery with fusion x2 L4-5, S1), and cervical disc disease. 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    Limitations Sitting;Lifting;Standing;House hold activities;Reading;Walking    How long can you sit comfortably? needs to move head or use heat pack    How long can you stand comfortably? n/a    How long can you walk comfortably? n/a    Diagnostic tests MRI:  disc bulge at C6-7    Patient Stated Goals to decrease her pain    Currently in Pain? Yes    Pain Score 1     Pain Location Neck    Pain Orientation Posterior    Pain Descriptors / Indicators Aching    Pain Type Neuropathic pain    Pain Onset More than a month ago    Pain Frequency Intermittent  Prone: Mobilizations: CPA/UPA grade II-III each level C1-T11. Hypomobile and painful throughout L side; improved with glides to right in cervical region. And inferior glides with subscap region. inferior directed J mobilization to anterior aspect of kyphotic curvature  STM to upper trap, levator scap, cervical paraspinals and portions of SCMs with focus on trigger point reduction . X 18 minutes   Supine Cervical side bend with overpressure at occiput and shoulder, L and R 4x 30 seconds Cervical rotation with overpressure at occiput and shoulder L and R 4x 30 seconds  Suboccipital release 3x30 second holds Median nerve glide BUE 15x; improvements with repetition  first rib inferior mobilizations 10x 8 second grade I-II glides    Sidelying: Rib inferior glide R for pain reduction 2x 30 second glides   Seated:    scapular retraction with depression mobilization grade II with overpressure and rotation 3 minutes         Pt educated throughout session about proper posture and technique with exercises. Improved exercise technique, movement at target joints, use of target muscles after min to mod verbal, visual, tactile cues                    PT Education - 12/09/19 0953    Education Details manual, exercise technique    Person(s) Educated Patient    Methods Explanation;Demonstration;Verbal cues;Tactile cues    Comprehension Verbalized understanding;Returned demonstration;Verbal cues required;Tactile cues required            PT Short Term Goals - 11/29/19 0859      PT SHORT TERM GOAL #1   Title Patient will be independent in home exercise program to improve strength/mobility/pain relief for better functional independence with ADLs.    Baseline 5/27: give HEP next session 7/2; HEP compliant    Time 2    Period Weeks    Status Achieved    Target Date 11/07/19             PT Long Term Goals - 12/06/19 1138      PT LONG TERM GOAL #1   Title Patient will report a worst pain of 3/10 on VAS in cervical spine to improve tolerance with ADLs and reduced symptoms with activities.    Baseline 5/27: 9/10 7/2: 4-5/10    Time 6    Period Weeks    Status Partially Met    Target Date 01/17/20      PT LONG TERM GOAL #2   Title Patient will increase FOTO score to equal to or greater than  53/100   to demonstrate statistically significant improvement in mobility and quality of life.    Baseline 5/27; 36/100 7/2: 46.33    Time 6    Period Weeks    Status Partially Met    Target Date 01/17/20      PT LONG TERM GOAL #3   Title Patient will reduce Neck Disability Index score to <10% to demonstrate minimal disability with ADL's including improved sleeping tolerance, sitting tolerance, etc for better mobility at home and work.    Baseline 5/27: 58% 7/2: 52%    Time 6    Period Weeks     Status Partially Met    Target Date 01/17/20      PT LONG TERM GOAL #4   Title Patient will tolerate sitting unsupported with neutral head position for 15+ minutes to demonstrate improved postural musculature strength and improved sitting tolerance.    Baseline 5/27: painful to sit at work  7/2; can tolerate but has increasing forward head rounded shoulders positioning    Time 6    Period Weeks    Status Partially Met    Target Date 01/17/20                 Plan - 12/09/19 3545    Clinical Impression Statement Patient has diffuse muscle guarding with limited muscle tissue length of levator trap and cervical accessory muscles. Use of focused interventions allow for reduction of symptoms. Has one more session prior to surgery. Patient would benefit from skilled physical therapy to reduce pain, improve muscle tissue length and posture, and return patient to PLOF    Personal Factors and Comorbidities Age;Comorbidity 3+;Past/Current Experience;Profession;Social Background;Time since onset of injury/illness/exacerbation    Comorbidities hyperglycemia, elevated BP, arthritis, HLD, restless legs, chronic insomnia, prior vertiginous migraine, thalamic CVA, anxiety, CAD, depression, GERD, hx of cancer, IBS, lumbar disc disease (prior back surgery with fusion x2 L4-5, S1), and cervical disc disease.    Examination-Activity Limitations Bed Mobility;Bend;Caring for [employer];Locomotion Level;Lift;Stand;Transfers;Hygiene/Grooming    Examination-Participation Restrictions Cleaning;Community Activity;Driving;Interpersonal Relationship;Laundry;Volunteer;Meal Prep;Yard Work;Other    Stability/Clinical Decision Making Evolving/Moderate complexity    Rehab Potential Fair    PT Frequency 2x / week    PT Duration 6 weeks    PT Treatment/Interventions ADLs/Self Care Home Management;Biofeedback;Cryotherapy;Electrical Stimulation;Iontophoresis 53m/ml Dexamethasone;Moist  Heat;Traction;Ultrasound;DME Instruction;Functional mobility training;Therapeutic activities;Patient/family education;Neuromuscular re-education;Balance training;Therapeutic exercise;Manual techniques;Energy conservation;Dry needling;Passive range of motion;Taping;Vestibular;Vasopneumatic Device;Spinal Manipulations;Visual/perceptual remediation/compensation;Canalith Repostioning    PT Next Visit Plan give cervical HEP. nerve glides, mobilize spine    PT Home Exercise Plan Cervical retraction supine, seated scapular retraction, seated scapular levation    Consulted and Agree with Plan of Care Patient           Patient will benefit from skilled therapeutic intervention in order to improve the following deficits and impairments:  Decreased activity tolerance, Decreased coordination, Decreased mobility, Decreased range of motion, Decreased strength, Hypomobility, Impaired flexibility, Impaired perceived functional ability, Increased muscle spasms, Postural dysfunction, Improper body mechanics, Pain, Impaired sensation, Impaired UE functional use  Visit Diagnosis: Cervicalgia  Abnormal posture  Muscle weakness (generalized)  Acute left-sided low back pain with sciatica, sciatica laterality unspecified     Problem List Patient Active Problem List   Diagnosis Date Noted  . PFO (patent foramen ovale)   . CAD (coronary artery disease), native coronary artery   . [place]) ([facility] [place]  . Depression 10/16/2017  . Migraine 10/16/2017  . Hyperglycemia, unspecified 07/10/2017  . Hyperglycemia 07/10/2017  . H/O dizziness 08/02/2016  . Cervical disc disorder with radiculopathy of cervical region 04/12/2016  . Nonallopathic lesion of thoracic region 04/12/2016  . Nonallopathic lesion of rib cage 04/12/2016  . Facet arthritis of cervical region 03/21/2016  . Hyperlipidemia 01/26/2015  . Labral tear of shoulder, right, subsequent encounter 12/04/2014  .  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 Impingement syndrome of right shoulder 05/28/2014  . Right supraspinatus tenosynovitis 05/28/2014  . Arthritis pain 04/03/2014  . Fatigue 04/03/2014  . 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 IBS (irritable bowel syndrome) 06/20/2012  . Lumbar disc disease 06/20/2012   MJanna Arch PT, DPT   12/09/2019, 9:59 AM  [place] [facility] [address] [address]Phone: [phone]  Fax:  [phone] Name: MGENEEN DIETERMRN: [id number] of Birth: 1Aug 11, 1962

## 2019-12-11 ENCOUNTER — Ambulatory Visit: Payer: 59

## 2019-12-11 ENCOUNTER — Other Ambulatory Visit: Payer: Self-pay

## 2019-12-11 DIAGNOSIS — M5440 Lumbago with sciatica, unspecified side: Secondary | ICD-10-CM

## 2019-12-11 DIAGNOSIS — M6281 Muscle weakness (generalized): Secondary | ICD-10-CM | POA: Diagnosis not present

## 2019-12-11 DIAGNOSIS — M542 Cervicalgia: Secondary | ICD-10-CM | POA: Diagnosis not present

## 2019-12-11 DIAGNOSIS — R293 Abnormal posture: Secondary | ICD-10-CM

## 2019-12-11 NOTE — Therapy (Signed)
[place] MAIN [facility] SERVICES [address] [place], [place], [address] Phone: [phone]   Fax:  [phone]  Physical Therapy Treatment/ DISCHARGE  Patient Details  Name: Lori Morales MRN: [id number] Date of Birth: [birth year] Referring Provider (PT): Tama High III   Encounter Date: 12/11/2019   PT End of Session - 12/11/19 1258    Visit Number 14    Number of Visits 24    Date for PT Re-Evaluation 01/17/20    Authorization Type 4/10 PN 11/29/19    PT Start Time 0930    PT Stop Time 5170    PT Time Calculation (min) 44 min    Activity Tolerance Patient tolerated treatment well;No increased pain    Behavior During Therapy WFL for tasks assessed/performed           Past Medical History:  Diagnosis Date  . Arthritis   . CAD (coronary artery disease), native coronary artery    mild non calcified plaque in mid LAD and prox LCx on coronary CTA 01/2019  . Depression   . GERD (gastroesophageal reflux disease)   . Headache    migraines  . Hx of squamous cell carcinoma 07/11/2016   Right distal dorsum lat forearm   . Hypertension   . IBS (irritable bowel syndrome)   . Palpitations   . PFO (patent foramen ovale)    small by cardiac CTA 01/2019  . PONV (postoperative nausea and vomiting)   . Restless legs syndrome     Past Surgical History:  Procedure Laterality Date  . ABDOMINOPLASTY    . BACK SURGERY     1 laminectomy, 1 fusion  . COLONOSCOPY    . COLONOSCOPY WITH PROPOFOL N/A 01/17/2017   Procedure: COLONOSCOPY WITH PROPOFOL;  Surgeon: Lollie Sails, MD;  Location: [facility] ENDOSCOPY;  Service: Endoscopy;  Laterality: N/A;  . WISDOM TOOTH EXTRACTION      There were no vitals filed for this visit.   Subjective Assessment - 12/11/19 1251    Subjective Patient is aware today is her last visit, will be having surgery on the 19th.  Had a migraine this morning due to stress of getting ready to be off work for her surgery.     Pertinent History Patient is a pleasant 59 year old female with new authorization for cervical disc disorder at C6-7 with radiculopathy. Neck pain began December 2020. Prednisone pack x3 times since December and then epidural march 3rd. PMH of hyperglycemia, elevated BP, arthritis, HLD, restless legs, chronic insomnia, prior vertiginous migraine, thalamic CVA, anxiety, CAD, depression, GERD, hx of cancer, IBS, lumbar disc disease (prior back surgery with fusion x2 L4-5, S1), and cervical disc disease. 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    Limitations Sitting;Lifting;Standing;House hold activities;Reading;Walking    How long can you sit comfortably? needs to move head or use heat pack    How long can you stand comfortably? n/a    How long can you walk comfortably? n/a    Diagnostic tests MRI:  disc bulge at C6-7    Patient Stated Goals to decrease her pain    Currently in Pain? Yes    Pain Score 2     Pain Location Neck    Pain Orientation Posterior    Pain Descriptors / Indicators Aching    Pain Type Neuropathic pain    Pain Onset More than a month ago  Pain Frequency Intermittent              FOTO: 45  Prone: Mobilizations: CPA/UPA grade II-III each level C1-T11. Hypomobile and painful throughout L side; improved with glides to right in cervical region. And inferior glides with subscap region. inferior directed J mobilization to anterior aspect of kyphotic curvature  STM to upper trap, levator scap, cervical paraspinals and portions of SCMs with focus on trigger point reduction . X 18 minutes   Supine Cervical side bend with overpressure at occiput and shoulder, L and R 4x 30 seconds Cervical rotation with overpressure at occiput and shoulder L and R 4x 30 seconds  Suboccipital release 3x30 second holds Median nerve glide BUE 15x; improvements with repetition  Patient is aware today is her last PT session due to  her upcoming surgery on Monday. Her FOTO retested scoring a 45; other goals deferred due to testing a week prior. I will be happy to see this patient again in the future as needed as this discharge is due to surgery rather than meeting of goals.                      PT Education - 12/11/19 1253    Education Details discharge    Person(s) Educated Patient    Methods Explanation;Demonstration;Tactile cues;Verbal cues    Comprehension Verbalized understanding;Returned demonstration;Verbal cues required;Tactile cues required            PT Short Term Goals - 11/29/19 0859      PT SHORT TERM GOAL #1   Title Patient will be independent in home exercise program to improve strength/mobility/pain relief for better functional independence with ADLs.    Baseline 5/27: give HEP next session 7/2; HEP compliant    Time 2    Period Weeks    Status Achieved    Target Date 11/07/19             PT Long Term Goals - 12/11/19 1658      PT LONG TERM GOAL #1   Title Patient will report a worst pain of 3/10 on VAS in cervical spine to improve tolerance with ADLs and reduced symptoms with activities.    Baseline 5/27: 9/10 7/2: 4-5/10    Time 6    Period Weeks    Status Partially Met      PT LONG TERM GOAL #2   Title Patient will increase FOTO score to equal to or greater than  53/100   to demonstrate statistically significant improvement in mobility and quality of life.    Baseline 5/27; 36/100 7/2: 46.33 7/14: 45%    Time 6    Period Weeks    Status Partially Met      PT LONG TERM GOAL #3   Title Patient will reduce Neck Disability Index score to <10% to demonstrate minimal disability with ADL's including improved sleeping tolerance, sitting tolerance, etc for better mobility at home and work.    Baseline 5/27: 58% 7/2: 52%    Time 6    Period Weeks    Status Partially Met      PT LONG TERM GOAL #4   Title Patient will tolerate sitting unsupported with neutral head  position for 15+ minutes to demonstrate improved postural musculature strength and improved sitting tolerance.    Baseline 5/27: painful to sit at work 7/2; can tolerate but has increasing forward head rounded shoulders positioning    Time 6    Period Weeks  Status Partially Met                 Plan - 12/11/19 1658    Clinical Impression Statement Patient is aware today is her last PT session due to her upcoming surgery on Monday. Her FOTO retested scoring a 45; other goals deferred due to testing a week prior. I will be happy to see this patient again in the future as needed as this discharge is due to surgery rather than meeting of goals.    Personal Factors and Comorbidities Age;Comorbidity 3+;Past/Current Experience;Profession;Social Background;Time since onset of injury/illness/exacerbation    Comorbidities hyperglycemia, elevated BP, arthritis, HLD, restless legs, chronic insomnia, prior vertiginous migraine, thalamic CVA, anxiety, CAD, depression, GERD, hx of cancer, IBS, lumbar disc disease (prior back surgery with fusion x2 L4-5, S1), and cervical disc disease.    Examination-Activity Limitations Bed Mobility;Bend;Caring for [employer];Locomotion Level;Lift;Stand;Transfers;Hygiene/Grooming    Examination-Participation Restrictions Cleaning;Community Activity;Driving;Interpersonal Relationship;Laundry;Volunteer;Meal Prep;Yard Work;Other    Stability/Clinical Decision Making Evolving/Moderate complexity    Rehab Potential Fair    PT Frequency 2x / week    PT Duration 6 weeks    PT Treatment/Interventions ADLs/Self Care Home Management;Biofeedback;Cryotherapy;Electrical Stimulation;Iontophoresis 39m/ml Dexamethasone;Moist Heat;Traction;Ultrasound;DME Instruction;Functional mobility training;Therapeutic activities;Patient/family education;Neuromuscular re-education;Balance training;Therapeutic exercise;Manual techniques;Energy conservation;Dry  needling;Passive range of motion;Taping;Vestibular;Vasopneumatic Device;Spinal Manipulations;Visual/perceptual remediation/compensation;Canalith Repostioning    PT Next Visit Plan give cervical HEP. nerve glides, mobilize spine    PT Home Exercise Plan Cervical retraction supine, seated scapular retraction, seated scapular levation    Consulted and Agree with Plan of Care Patient           Patient will benefit from skilled therapeutic intervention in order to improve the following deficits and impairments:  Decreased activity tolerance, Decreased coordination, Decreased mobility, Decreased range of motion, Decreased strength, Hypomobility, Impaired flexibility, Impaired perceived functional ability, Increased muscle spasms, Postural dysfunction, Improper body mechanics, Pain, Impaired sensation, Impaired UE functional use  Visit Diagnosis: Cervicalgia  Abnormal posture  Muscle weakness (generalized)  Acute left-sided low back pain with sciatica, sciatica laterality unspecified     Problem List Patient Active Problem List   Diagnosis Date Noted  . PFO (patent foramen ovale)   . CAD (coronary artery disease), native coronary artery   . [place]  . Depression 10/16/2017  . Migraine 10/16/2017  . Hyperglycemia, unspecified 07/10/2017  . Hyperglycemia 07/10/2017  . H/O dizziness 08/02/2016  . Cervical disc disorder with radiculopathy of cervical region 04/12/2016  . Nonallopathic lesion of thoracic region 04/12/2016  . Nonallopathic lesion of rib cage 04/12/2016  . Facet arthritis of cervical region 03/21/2016  . Hyperlipidemia 01/26/2015  . Labral tear of shoulder, right, subsequent encounter 12/04/2014  . 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 Impingement syndrome of right shoulder 05/28/2014  . Right supraspinatus tenosynovitis 05/28/2014  . Arthritis pain 04/03/2014  . Fatigue 04/03/2014  . Cervical intraepithelial neoplasia grade 1  06/20/2012  . IBS (irritable bowel syndrome) 06/20/2012  . Lumbar disc disease 06/20/2012   MJanna Arch PT, DPT   12/11/2019, 4:59 PM  [place] [facility] [address] [address]Phone: [phone]  Fax:  [phone] Name: MMACIEL KEGGMRN: [id number] of Birth: [birth year]

## 2019-12-16 DIAGNOSIS — M4722 Other spondylosis with radiculopathy, cervical region: Secondary | ICD-10-CM | POA: Diagnosis not present

## 2019-12-16 DIAGNOSIS — M50123 Cervical disc disorder at C6-C7 level with radiculopathy: Secondary | ICD-10-CM | POA: Diagnosis not present

## 2020-01-03 DIAGNOSIS — Z6828 Body mass index (BMI) 28.0-28.9, adult: Secondary | ICD-10-CM | POA: Diagnosis not present

## 2020-01-03 DIAGNOSIS — R030 Elevated blood-pressure reading, without diagnosis of hypertension: Secondary | ICD-10-CM | POA: Diagnosis not present

## 2020-01-03 DIAGNOSIS — M50123 Cervical disc disorder at C6-C7 level with radiculopathy: Secondary | ICD-10-CM | POA: Diagnosis not present

## 2020-01-08 ENCOUNTER — Ambulatory Visit: Payer: 59 | Attending: Internal Medicine

## 2020-01-08 ENCOUNTER — Other Ambulatory Visit: Payer: Self-pay

## 2020-01-08 DIAGNOSIS — R293 Abnormal posture: Secondary | ICD-10-CM | POA: Diagnosis not present

## 2020-01-08 DIAGNOSIS — M542 Cervicalgia: Secondary | ICD-10-CM | POA: Diagnosis not present

## 2020-01-08 DIAGNOSIS — M6281 Muscle weakness (generalized): Secondary | ICD-10-CM | POA: Insufficient documentation

## 2020-01-08 DIAGNOSIS — M5412 Radiculopathy, cervical region: Secondary | ICD-10-CM | POA: Diagnosis not present

## 2020-01-08 NOTE — Therapy (Signed)
[place] MAIN [facility] SERVICES [address] [place], [place], [address] Phone: [phone]   Fax:  [phone]  Physical Therapy Evaluation  Patient Details  Name: Lori Morales MRN: [id number] Date of Birth: 10-Nov-1960 Referring Provider (PT): Dr. Earleen Newport    Encounter Date: 01/08/2020   PT End of Session - 01/08/20 1806    Visit Number 1    Number of Visits 16    Date for PT Re-Evaluation 03/04/20    Authorization Type 1/10 eval 01/08/20    PT Start Time 1100    PT Stop Time 1156    PT Time Calculation (min) 56 min    Activity Tolerance Patient tolerated treatment well;Other (comment)   lack of protocol   Behavior During Therapy [facility] for tasks assessed/performed           Past Medical History:  Diagnosis Date  . Arthritis   . CAD (coronary artery disease), native coronary artery    mild non calcified plaque in mid LAD and prox LCx on coronary CTA 01/2019  . Depression   . GERD (gastroesophageal reflux disease)   . Headache    migraines  . Hx of squamous cell carcinoma 07/11/2016   Right distal dorsum lat forearm   . Hypertension   . IBS (irritable bowel syndrome)   . Palpitations   . PFO (patent foramen ovale)    small by cardiac CTA 01/2019  . PONV (postoperative nausea and vomiting)   . Restless legs syndrome     Past Surgical History:  Procedure Laterality Date  . ABDOMINOPLASTY    . BACK SURGERY     1 laminectomy, 1 fusion  . COLONOSCOPY    . COLONOSCOPY WITH PROPOFOL N/A 01/17/2017   Procedure: COLONOSCOPY WITH PROPOFOL;  Surgeon: Lollie Sails, MD;  Location[facility] ENDOSCOPY;  Service: Endoscopy;  Laterality: N/A;  . WISDOM TOOTH EXTRACTION      There were no vitals filed for this visit.    Subjective Assessment - 01/08/20 1801    Subjective Patient presents to PT s/p C6-7 arthroplasty w spur removal and discectomy.    Pertinent History Patient returning to PT after C6-7 arthroplasty with spur removal  and discectomy on 12/06/19.  After one week started trying to quilt and had bad pain in her neck. Ever since surgery had pain in L side and shoulder with arm to forearm; can't sleep on L side right now. Is on a steroid taper now for reduction of pain. Was not given a protocol but was told no lifting more than 10lb, no cervical extension. Reports the surgeon wants to be conservative with treatment at the beginning. PMH of hyperglycemia, elevated BP, arthritis, HLD, restless legs, chronic insomnia, prior vertiginous migraine, thalamic CVA, anxiety, CAD, depression, GERD, hx of cancer, IBS, lumbar disc disease (prior back surgery with fusion x2 L4-5, S1), and cervical disc disease. 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    Limitations Sitting;Lifting;Standing;House hold activities;Reading    How long can you sit comfortably? neck pain limits sitting position    How long can you stand comfortably? n/a    How long can you walk comfortably? 2 miles    Patient Stated Goals decrease pain, improve ROM    Currently in Pain? Yes    Pain Score 3     Pain Location Neck    Pain Orientation Left    Pain Descriptors /  Indicators Aching;Radiating    Pain Type Surgical pain    Pain Radiating Towards down LUE; occasionally RUE    Pain Onset 1 to 4 weeks ago    Pain Frequency Constant    Aggravating Factors  prolonged positioning, cervical movement    Pain Relieving Factors heat, laying down, muscle relaxer    Effect of Pain on Daily Activities limits mobility,          3 weeks Monday.     [facility] PT Assessment - 01/08/20 0001      Assessment   Medical Diagnosis C6-7 arthroplasty ; Cervical disc disorder at C6-7 level with radiculopathy    Referring Provider (PT) Dr. Blanchie Dessert [place]     Onset Date/Surgical Date 12/06/19    Hand Dominance Right    Prior Therapy yes      Precautions   Precautions Cervical    Precaution Comments awaiting surgical  protocol      Restrictions   Weight Bearing Restrictions Yes    Other Position/Activity Restrictions no lifting > 10 lb, no cervical extension.       Balance Screen   Has the patient fallen in the past 6 months No    Has the patient had a decrease in activity level because of a fear of falling?  Yes    Is the patient reluctant to leave their home because of a fear of falling?  No      Home Ecologist residence    Living Arrangements Spouse/significant other    Available Help at Discharge Family    Type of [place] to enter    Entrance Stairs-Number of Steps 2      Prior Function   Level of Independence Independent    Vocation Full time employment    Vocation Requirements prolonged sitting    Leisure works out 5x/week. hiking, bird watching       Cognition   Overall Cognitive Status Within Functional Limits for tasks assessed      Observation/Other Assessments   Focus on Therapeutic Outcomes (FOTO)  40%    Other Surveys  Neck Disability Index    Neck Disability Index  62%      Sensation   Light Touch Appears Intact            Cervical disc disorder at C6-C7 level with radiculopathy   PAIN: Worst: 8/10 : shooting down L neck to shoulder to the wrist, had one two spasms down R neck   Best: 1/10 on medication.   Pain is worse in the evening, uses heat to help it and the pain medicine and muscle relaxer.   POSTURE: L cervical head tilt, slight cervical flexion   PROM/AROM:  AROM BUE: WFL no pain with UE movements.    Cervical AROM Seated Right Left  Flexion WFL  Extension Unable to test due to restrictions/protocol  Side Bending 10 4  Rotation 28 40     STRENGTH:  Graded on a 0-5 scale Muscle testing deferred due to limited restriction on  SENSATION:  BUE :  BLE :   NEUROLOGICAL SCREEN: (2+ unless otherwise noted.) N=normal  Ab=abnormal   Level Dermatome R L  C3 Anterior Neck  N N  C4 Top of  Shoulder N N  C5 Lateral Upper Arm  N N  C6 Lateral Arm/ Thumb  N N  C7 Middle Finger  N N  C8 4th & 5th Finger  N N  T1 Medial Arm N N  L2 Medial thigh/groin N N  L3 Lower thigh/med.knee N N  L4 Medial leg/lat thigh N N  L5 Lat. leg & dorsal foot N N  S1 post/lat foot/thigh/leg N N  S2 Post./med. thigh & leg N N    SOMATOSENSORY:  Any N & T in extremities or weakness: reports :         Sensation           Intact      Diminished         Absent  Light touch LEs +UE's                              COORDINATION: WFL for UE's  SPECIAL TESTS: Unable to perform due to restricted protocol  FUNCTIONAL MOBILITY: STS functional but guarded    GAIT: Guarding of UE noted with decreased head rotation and movement  OUTCOME MEASURES: TEST Outcome Interpretation  NDI 62% Severe disability   FOTO 40% Discharge predicted score 50%                    HEP:  Median nerve glide modified: UE movement only 10x 2 sets a day Ulnar nerve glide modified: UE movement only 10x 2 sets a day    Treatment:  STM to cervical paraspinals, levator scap, upper trap, SCM, and masseter region bilaterally with implementation of effleurage and ptrissage x 11 minutes      Objective measurements completed on examination: See above findings.                PT Education - 01/08/20 1804    Education Details goals, POC, nerve glides    Person(s) Educated Patient    Methods Explanation;Demonstration;Tactile cues;Verbal cues    Comprehension Verbalized understanding;Returned demonstration;Verbal cues required;Tactile cues required            PT Short Term Goals - 01/08/20 1809      PT SHORT TERM GOAL #1   Title Patient will be independent in home exercise program to improve strength/mobility/pain relief for better functional independence with ADLs.    Baseline 8/11; HEP given    Time 4    Period Weeks    Status New    Target Date 02/05/20             PT Long Term Goals -  01/08/20 1810      PT LONG TERM GOAL #1   Title Patient will report a worst pain of 3/10 on VAS in cervical spine to improve tolerance with ADLs and reduced symptoms with activities.    Baseline 8/11: 8/10    Time 8    Period Weeks    Status New    Target Date 03/04/20      PT LONG TERM GOAL #2   Title Patient will increase FOTO score to equal to or greater than  50/100   to demonstrate statistically significant improvement in mobility and quality of life.    Baseline 8/11: 40%    Time 8    Period Weeks    Status New    Target Date 03/04/20      PT LONG TERM GOAL #3   Title Patient will reduce Neck Disability Index score to <10% to demonstrate minimal disability with ADL's including improved sleeping tolerance, sitting tolerance, etc for better mobility at home and work.    Baseline 8/11: 62%  Time 8    Period Weeks    Status New    Target Date 03/04/20      PT LONG TERM GOAL #4   Title Patient will increase cervical AROM within 10 degrees of functional range for improved function, ability to drive, and return to PLOF.    Baseline 8/11: extension deferred rotation: R 28 L 40, SB R 10 L 4    Time 8    Period Weeks    Status New    Target Date 03/04/20                  Plan - 01/08/20 1807    Clinical Impression Statement Patient is returning to PT after C6-7 arthroplasty with spur removal and discectomy on 12/06/19. Protocol was not sent by surgeon and have attempted to reach surgeon for further information. Patient reports current restrictions include: no cervical extension and no lifting >10 lb. Patient's evaluation was limited due to limited information on restrictions however diffuse muscle tension and limitations of cervical musculature length noted. ROM additionally impacted as well as posturing with concurrent radiating symptoms in LUE. Patient would benefit from skilled physical therapy to reduce pain, improve muscle tissue length and posture, and return patient to  PLOF    Personal Factors and Comorbidities Age;Comorbidity 3+;Past/Current Experience;Profession;Social Background;Time since onset of injury/illness/exacerbation    Comorbidities hyperglycemia, elevated BP, arthritis, HLD, restless legs, chronic insomnia, prior vertiginous migraine, thalamic CVA, anxiety, CAD, depression, GERD, hx of cancer, IBS, lumbar disc disease (prior back surgery with fusion x2 L4-5, S1), and cervical disc disease.    Examination-Activity Limitations Bed Mobility;Bend;Caring for Others;Carry;Dressing;Sleep;Reach Overhead;Lift;Hygiene/Grooming;Stairs;Bathing;Sit    Examination-Participation Restrictions Cleaning;Community Activity;Driving;Interpersonal Relationship;Laundry;Volunteer;Meal Prep;Yard Work;Other    Stability/Clinical Decision Making Evolving/Moderate complexity    Clinical Decision Making Moderate    Rehab Potential Fair    PT Frequency 2x / week    PT Duration 8 weeks    PT Treatment/Interventions ADLs/Self Care Home Management;Biofeedback;Cryotherapy;Electrical Stimulation;Iontophoresis 4mg /ml Dexamethasone;Moist Heat;Traction;Ultrasound;DME Instruction;Functional mobility training;Therapeutic activities;Patient/family education;Neuromuscular re-education;Therapeutic exercise;Manual techniques;Energy conservation;Dry needling;Passive range of motion;Taping;Vestibular;Vasopneumatic Device;Visual/perceptual remediation/compensation;Canalith Repostioning;Balance training    PT Next Visit Plan nerve glides, STM, posture    PT Home Exercise Plan median and ulnar modified nerve glides    Consulted and Agree with Plan of Care Patient           Patient will benefit from skilled therapeutic intervention in order to improve the following deficits and impairments:  Decreased activity tolerance, Decreased mobility, Decreased range of motion, Decreased strength, Hypomobility, Impaired flexibility, Impaired perceived functional ability, Increased muscle spasms, Postural  dysfunction, Improper body mechanics, Pain, Decreased knowledge of precautions, Decreased scar mobility  Visit Diagnosis: Cervicalgia  Abnormal posture  Radiculopathy, cervical region     Problem List Patient Active Problem List   Diagnosis Date Noted  . PFO (patent foramen ovale)   . CAD (coronary artery disease), native coronary artery   . Acute CVA (cerebrovascular accident) ([place]) 06/15/2018  . Depression 10/16/2017  . Migraine 10/16/2017  . Hyperglycemia, unspecified 07/10/2017  . Hyperglycemia 07/10/2017  . H/O dizziness 08/02/2016  . Cervical disc disorder with radiculopathy of cervical region 04/12/2016  . Nonallopathic lesion of thoracic region 04/12/2016  . Nonallopathic lesion of rib cage 04/12/2016  . Facet arthritis of cervical region 03/21/2016  . Hyperlipidemia 01/26/2015  . Labral tear of shoulder, right, subsequent encounter 12/04/2014  . 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 Impingement syndrome of right shoulder 05/28/2014  . Right supraspinatus tenosynovitis 05/28/2014  . Arthritis pain 04/03/2014  .  Fatigue 04/03/2014  . 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 IBS (irritable bowel syndrome) 06/20/2012  . Lumbar disc disease 06/20/2012   Janna Arch, PT, DPT   01/08/2020, 6:14 PM  [place] MAIN [facility] SERVICES [address] [place], [place], [address] Phone: [phone]   Fax:  [phone]  Name: Lori Morales MRN: [id number] Date of Birth: 11/10/1960

## 2020-01-13 DIAGNOSIS — H5213 Myopia, bilateral: Secondary | ICD-10-CM | POA: Diagnosis not present

## 2020-01-14 ENCOUNTER — Ambulatory Visit: Payer: 59

## 2020-01-14 ENCOUNTER — Other Ambulatory Visit: Payer: Self-pay

## 2020-01-14 DIAGNOSIS — R293 Abnormal posture: Secondary | ICD-10-CM

## 2020-01-14 DIAGNOSIS — M5412 Radiculopathy, cervical region: Secondary | ICD-10-CM

## 2020-01-14 DIAGNOSIS — M542 Cervicalgia: Secondary | ICD-10-CM | POA: Diagnosis not present

## 2020-01-14 DIAGNOSIS — M6281 Muscle weakness (generalized): Secondary | ICD-10-CM

## 2020-01-14 NOTE — Therapy (Signed)
[place] MAIN [facility] SERVICES [address] [place], [place], [address] Phone: [phone]   Fax:  [phone]  Physical Therapy Treatment  Patient Details  Name: Lori Morales MRN: [id number] Date of Birth: 04-02-1961 Referring Provider (PT): Dr. Earleen Newport    Encounter Date: 01/14/2020   PT End of Session - 01/14/20 1335    Visit Number 2    Number of Visits 16    Date for PT Re-Evaluation 03/04/20    Authorization Type 2/10 eval 01/08/20    PT Start Time 1114    PT Stop Time 1159    PT Time Calculation (min) 45 min    Activity Tolerance Patient tolerated treatment well;Other (comment)   lack of protocol   Behavior During Therapy [facility] for tasks assessed/performed           Past Medical History:  Diagnosis Date  . Arthritis   . CAD (coronary artery disease), native coronary artery    mild non calcified plaque in mid LAD and prox LCx on coronary CTA 01/2019  . Depression   . GERD (gastroesophageal reflux disease)   . Headache    migraines  . Hx of squamous cell carcinoma 07/11/2016   Right distal dorsum lat forearm   . Hypertension   . IBS (irritable bowel syndrome)   . Palpitations   . PFO (patent foramen ovale)    small by cardiac CTA 01/2019  . PONV (postoperative nausea and vomiting)   . Restless legs syndrome     Past Surgical History:  Procedure Laterality Date  . ABDOMINOPLASTY    . BACK SURGERY     1 laminectomy, 1 fusion  . COLONOSCOPY    . COLONOSCOPY WITH PROPOFOL N/A 01/17/2017   Procedure: COLONOSCOPY WITH PROPOFOL;  Surgeon: Lollie Sails, MD;  Location: [facility] ENDOSCOPY;  Service: Endoscopy;  Laterality: N/A;  . WISDOM TOOTH EXTRACTION      There were no vitals filed for this visit.   Subjective Assessment - 01/14/20 1332    Subjective Patient reports her pain continues to intermittently radiate down bilateral arms with L more than R. Is leaving saturday for [place].    Pertinent History Patient  returning to PT after C6-7 arthroplasty with spur removal and discectomy on 12/06/19.  After one week started trying to quilt and had bad pain in her neck. Ever since surgery had pain in L side and shoulder with arm to forearm; can't sleep on L side right now. Is on a steroid taper now for reduction of pain. Was not given a protocol but was told no lifting more than 10lb, no cervical extension. Reports the surgeon wants to be conservative with treatment at the beginning. PMH of hyperglycemia, elevated BP, arthritis, HLD, restless legs, chronic insomnia, prior vertiginous migraine, thalamic CVA, anxiety, CAD, depression, GERD, hx of cancer, IBS, lumbar disc disease (prior back surgery with fusion x2 L4-5, S1), and cervical disc disease. 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    Limitations Sitting;Lifting;Standing;House hold activities;Reading    How long can you sit comfortably? neck pain limits sitting position    How long can you stand comfortably? n/a    How long can you walk comfortably? 2 miles    Patient Stated Goals decrease pain, improve ROM    Currently in Pain? Yes    Pain Score 3     Pain Location Neck  Pain Orientation Upper;Mid;Lower    Pain Descriptors / Indicators Aching;Radiating    Pain Type Neuropathic pain;Surgical pain    Pain Onset 1 to 4 weeks ago    Pain Frequency Constant             STM  Nerve glides    supine: use of heat pad under upper thoracic lower cervical region STM to levator scap, scalenes, SCM, and upper trap x 3 minutes each side for 6 minutes total Scar tissue massage with cross friction reduction x2 minutes  Trigger point with mobilization release to bicep with extension/flexion supination/pronation x 3 minutes LUE Median nerve glides 15x each UE (more limited on the L) Ulnar nerve glides 15x each UE (more limited on the L)   Prone:  Grade II-III mobilizations to thoracic spine with  reduced hypomobility with repetition 4x 10 seconds each level J mobilization to upper thoracic/lower cervical for reduced thoracic kyphosis. 3x30 seconds STM with implementation of effleurage and pettrisage to levator scap, upper trap, cervical paraspinals, and rhomboids x 12 minutes  Seated: Thoracic extension keeping chin tucked for postural correction 10x Bilateral shoulder ER with scapular retraction for neutral postural alignment 10x Ulnar nerve glide prayer position 10x     Pt educated throughout session about proper posture and technique with exercises. Improved exercise technique, movement at target joints, use of target muscles after min to mod verbal, visual, tactile cues.                PT Education - 01/14/20 1334    Education Details manual, posture    Person(s) Educated Patient    Methods Explanation;Demonstration;Tactile cues;Verbal cues    Comprehension Verbalized understanding;Returned demonstration;Verbal cues required;Tactile cues required            PT Short Term Goals - 01/08/20 1809      PT SHORT TERM GOAL #1   Title Patient will be independent in home exercise program to improve strength/mobility/pain relief for better functional independence with ADLs.    Baseline 8/11; HEP given    Time 4    Period Weeks    Status New    Target Date 02/05/20             PT Long Term Goals - 01/08/20 1810      PT LONG TERM GOAL #1   Title Patient will report a worst pain of 3/10 on VAS in cervical spine to improve tolerance with ADLs and reduced symptoms with activities.    Baseline 8/11: 8/10    Time 8    Period Weeks    Status New    Target Date 03/04/20      PT LONG TERM GOAL #2   Title Patient will increase FOTO score to equal to or greater than  50/100   to demonstrate statistically significant improvement in mobility and quality of life.    Baseline 8/11: 40%    Time 8    Period Weeks    Status New    Target Date 03/04/20      PT LONG  TERM GOAL #3   Title Patient will reduce Neck Disability Index score to <10% to demonstrate minimal disability with ADL's including improved sleeping tolerance, sitting tolerance, etc for better mobility at home and work.    Baseline 8/11: 62%    Time 8    Period Weeks    Status New    Target Date 03/04/20      PT LONG TERM GOAL #4  Title Patient will increase cervical AROM within 10 degrees of functional range for improved function, ability to drive, and return to PLOF.    Baseline 8/11: extension deferred rotation: R 28 L 40, SB R 10 L 4    Time 8    Period Weeks    Status New    Target Date 03/04/20                 Plan - 01/14/20 1335    Clinical Impression Statement Patient's sessions continue to be limited due to waiting for protocol from surgical office. This therapist has attempted multiple times to contact office and is still waiting for the protocol. Use of heat and gentle soft tissue work for reduction of tension in combination with postural correction interventions reduced patient tension and improved pain levels per patient report. Patient would benefit from skilled physical therapy to reduce pain, improve muscle tissue length and posture, and return patient to PLOF    Personal Factors and Comorbidities Age;Comorbidity 3+;Past/Current Experience;Profession;Social Background;Time since onset of injury/illness/exacerbation    Comorbidities hyperglycemia, elevated BP, arthritis, HLD, restless legs, chronic insomnia, prior vertiginous migraine, thalamic CVA, anxiety, CAD, depression, GERD, hx of cancer, IBS, lumbar disc disease (prior back surgery with fusion x2 L4-5, S1), and cervical disc disease.    Examination-Activity Limitations Bed Mobility;Bend;Caring for Others;Carry;Dressing;Sleep;Reach Overhead;Lift;Hygiene/Grooming;Stairs;Bathing;Sit    Examination-Participation Restrictions Cleaning;Community Activity;Driving;Interpersonal Relationship;Laundry;Volunteer;Meal  Prep;Yard Work;Other    Stability/Clinical Decision Making Evolving/Moderate complexity    Rehab Potential Fair    PT Frequency 2x / week    PT Duration 8 weeks    PT Treatment/Interventions ADLs/Self Care Home Management;Biofeedback;Cryotherapy;Electrical Stimulation;Iontophoresis 4mg /ml Dexamethasone;Moist Heat;Traction;Ultrasound;DME Instruction;Functional mobility training;Therapeutic activities;Patient/family education;Neuromuscular re-education;Therapeutic exercise;Manual techniques;Energy conservation;Dry needling;Passive range of motion;Taping;Vestibular;Vasopneumatic Device;Visual/perceptual remediation/compensation;Canalith Repostioning;Balance training    PT Next Visit Plan nerve glides, STM, posture    PT Home Exercise Plan median and ulnar modified nerve glides    Consulted and Agree with Plan of Care Patient           Patient will benefit from skilled therapeutic intervention in order to improve the following deficits and impairments:  Decreased activity tolerance, Decreased mobility, Decreased range of motion, Decreased strength, Hypomobility, Impaired flexibility, Impaired perceived functional ability, Increased muscle spasms, Postural dysfunction, Improper body mechanics, Pain, Decreased knowledge of precautions, Decreased scar mobility  Visit Diagnosis: Cervicalgia  Abnormal posture  Radiculopathy, cervical region  Muscle weakness (generalized)     Problem List Patient Active Problem List   Diagnosis Date Noted  . PFO (patent foramen ovale)   . CAD (coronary artery disease), native coronary artery   . Acute CVA (cerebrovascular accident) ([place]) 06/15/2018  . Depression 10/16/2017  . Migraine 10/16/2017  . Hyperglycemia, unspecified 07/10/2017  . Hyperglycemia 07/10/2017  . H/O dizziness 08/02/2016  . Cervical disc disorder with radiculopathy of cervical region 04/12/2016  . Nonallopathic lesion of thoracic region 04/12/2016  . Nonallopathic lesion of rib cage  04/12/2016  . Facet arthritis of cervical region 03/21/2016  . Hyperlipidemia 01/26/2015  . Labral tear of shoulder, right, subsequent encounter 12/04/2014  . 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 Impingement syndrome of right shoulder 05/28/2014  . Right supraspinatus tenosynovitis 05/28/2014  . Arthritis pain 04/03/2014  . Fatigue 04/03/2014  . 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 IBS (irritable bowel syndrome) 06/20/2012  . Lumbar disc disease 06/20/2012   Janna Arch, PT, DPT   01/14/2020, 1:36 PM  [place] MAIN [facility] SERVICES [address] [place], [place], [address]  Phone: [phone]   Fax:  [phone]  Name: Lori Morales MRN: [id number] Date of Birth: 1960/10/18

## 2020-01-16 ENCOUNTER — Other Ambulatory Visit: Payer: Self-pay

## 2020-01-16 ENCOUNTER — Ambulatory Visit: Payer: 59

## 2020-01-16 DIAGNOSIS — R293 Abnormal posture: Secondary | ICD-10-CM | POA: Diagnosis not present

## 2020-01-16 DIAGNOSIS — M6281 Muscle weakness (generalized): Secondary | ICD-10-CM | POA: Diagnosis not present

## 2020-01-16 DIAGNOSIS — M5412 Radiculopathy, cervical region: Secondary | ICD-10-CM | POA: Diagnosis not present

## 2020-01-16 DIAGNOSIS — M542 Cervicalgia: Secondary | ICD-10-CM

## 2020-01-16 NOTE — Therapy (Signed)
[place] MAIN [facility] SERVICES [address] [place], [place], [address] Phone: [phone]   Fax:  [phone]  Physical Therapy Treatment  Patient Details  Name: Lori Morales MRN: [id number] Date of Birth: 12-Mar-1961 Referring Provider (PT): Dr. Earleen Newport    Encounter Date: 01/16/2020   PT End of Session - 01/16/20 1607    Visit Number 3    Number of Visits 16    Date for PT Re-Evaluation 03/04/20    Authorization Type 3/10 eval 01/08/20    PT Start Time 1518    PT Stop Time 1600    PT Time Calculation (min) 42 min    Activity Tolerance Patient tolerated treatment well;Other (comment)   lack of protocol   Behavior During Therapy [facility] for tasks assessed/performed           Past Medical History:  Diagnosis Date  . Arthritis   . CAD (coronary artery disease), native coronary artery    mild non calcified plaque in mid LAD and prox LCx on coronary CTA 01/2019  . Depression   . GERD (gastroesophageal reflux disease)   . Headache    migraines  . Hx of squamous cell carcinoma 07/11/2016   Right distal dorsum lat forearm   . Hypertension   . IBS (irritable bowel syndrome)   . Palpitations   . PFO (patent foramen ovale)    small by cardiac CTA 01/2019  . PONV (postoperative nausea and vomiting)   . Restless legs syndrome     Past Surgical History:  Procedure Laterality Date  . ABDOMINOPLASTY    . BACK SURGERY     1 laminectomy, 1 fusion  . COLONOSCOPY    . COLONOSCOPY WITH PROPOFOL N/A 01/17/2017   Procedure: COLONOSCOPY WITH PROPOFOL;  Surgeon: Lollie Sails, MD;  Location: [facility] ENDOSCOPY;  Service: Endoscopy;  Laterality: N/A;  . WISDOM TOOTH EXTRACTION      There were no vitals filed for this visit.   Subjective Assessment - 01/16/20 1602    Subjective Patient is leaving for vacation for a week and a half. Is aware of her need to maintain proper alignment and continue nerve glides while away. Sleeping is still  challenging for her.    Pertinent History Patient returning to PT after C6-7 arthroplasty with spur removal and discectomy on 12/06/19.  After one week started trying to quilt and had bad pain in her neck. Ever since surgery had pain in L side and shoulder with arm to forearm; can't sleep on L side right now. Is on a steroid taper now for reduction of pain. Was not given a protocol but was told no lifting more than 10lb, no cervical extension. Reports the surgeon wants to be conservative with treatment at the beginning. PMH of hyperglycemia, elevated BP, arthritis, HLD, restless legs, chronic insomnia, prior vertiginous migraine, thalamic CVA, anxiety, CAD, depression, GERD, hx of cancer, IBS, lumbar disc disease (prior back surgery with fusion x2 L4-5, S1), and cervical disc disease. 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    Limitations Sitting;Lifting;Standing;House hold activities;Reading    How long can you sit comfortably? neck pain limits sitting position    How long can you stand comfortably? n/a    How long can you walk comfortably? 2 miles    Patient Stated Goals decrease pain, improve ROM    Currently in Pain? Yes    Pain Score  3     Pain Location Neck    Pain Orientation Mid;Lower;Upper    Pain Descriptors / Indicators Aching;Radiating    Pain Type Chronic pain;Neuropathic pain    Pain Onset 1 to 4 weeks ago    Pain Frequency Constant                STM  Nerve glides  supine: use of heat pad under upper thoracic lower cervical region STM to levator scap, scalenes, SCM, and upper trap x 3 minutes each side for 6 minutes total Scar tissue massage with cross friction reduction x2 minutes  Trigger point with mobilization release to bicep with extension/flexion supination/pronation x 3 minutes LUE Median nerve glides 15x each UE (more limited on the L) Ulnar nerve glides 15x each UE (more limited on the L)   Prone:   Grade II-III mobilizations to thoracic spine with reduced hypomobility with repetition 4x 10 seconds each level J mobilization to upper thoracic/lower cervical for reduced thoracic kyphosis. 3x30 seconds STM with implementation of effleurage and pettrisage to levator scap, upper trap, cervical paraspinals, and rhomboids x 12 minutes   Seated: Thoracic extension keeping chin tucked for postural correction 10x Bilateral shoulder ER with scapular retraction for neutral postural alignment 10x Ulnar nerve glide prayer position 10x  Pt educated throughout session about proper posture and technique with exercises. Improved exercise technique, movement at target joints, use of target muscles after min to mod verbal, visual, tactile cues.                       PT Education - 01/16/20 1607    Education Details traveling posture, manual    Person(s) Educated Patient    Methods Explanation;Demonstration;Tactile cues;Verbal cues    Comprehension Verbalized understanding;Returned demonstration;Verbal cues required;Tactile cues required            PT Short Term Goals - 01/08/20 1809      PT SHORT TERM GOAL #1   Title Patient will be independent in home exercise program to improve strength/mobility/pain relief for better functional independence with ADLs.    Baseline 8/11; HEP given    Time 4    Period Weeks    Status New    Target Date 02/05/20             PT Long Term Goals - 01/08/20 1810      PT LONG TERM GOAL #1   Title Patient will report a worst pain of 3/10 on VAS in cervical spine to improve tolerance with ADLs and reduced symptoms with activities.    Baseline 8/11: 8/10    Time 8    Period Weeks    Status New    Target Date 03/04/20      PT LONG TERM GOAL #2   Title Patient will increase FOTO score to equal to or greater than  50/100   to demonstrate statistically significant improvement in mobility and quality of life.    Baseline 8/11: 40%    Time 8     Period Weeks    Status New    Target Date 03/04/20      PT LONG TERM GOAL #3   Title Patient will reduce Neck Disability Index score to <10% to demonstrate minimal disability with ADL's including improved sleeping tolerance, sitting tolerance, etc for better mobility at home and work.    Baseline 8/11: 62%    Time 8    Period Weeks    Status  New    Target Date 03/04/20      PT LONG TERM GOAL #4   Title Patient will increase cervical AROM within 10 degrees of functional range for improved function, ability to drive, and return to PLOF.    Baseline 8/11: extension deferred rotation: R 28 L 40, SB R 10 L 4    Time 8    Period Weeks    Status New    Target Date 03/04/20                 Plan - 01/16/20 1609    Clinical Impression Statement Patient's sessions continue to be limited due to awaiting protocol from surgical office. Attempted reaching out again without success. Maintaining standard precautions while focusing on radiating pain reduction. Patient reports decreased radiating by end of session. Will see patient again when they return from vacation.Patient would benefit from skilled physical therapy to reduce pain, improve muscle tissue length and posture, and return patient to PLOF    Personal Factors and Comorbidities Age;Comorbidity 3+;Past/Current Experience;Profession;Social Background;Time since onset of injury/illness/exacerbation    Comorbidities hyperglycemia, elevated BP, arthritis, HLD, restless legs, chronic insomnia, prior vertiginous migraine, thalamic CVA, anxiety, CAD, depression, GERD, hx of cancer, IBS, lumbar disc disease (prior back surgery with fusion x2 L4-5, S1), and cervical disc disease.    Examination-Activity Limitations Bed Mobility;Bend;Caring for Others;Carry;Dressing;Sleep;Reach Overhead;Lift;Hygiene/Grooming;Stairs;Bathing;Sit    Examination-Participation Restrictions Cleaning;Community Activity;Driving;Interpersonal  Relationship;Laundry;Volunteer;Meal Prep;Yard Work;Other    Stability/Clinical Decision Making Evolving/Moderate complexity    Rehab Potential Fair    PT Frequency 2x / week    PT Duration 8 weeks    PT Treatment/Interventions ADLs/Self Care Home Management;Biofeedback;Cryotherapy;Electrical Stimulation;Iontophoresis 4mg /ml Dexamethasone;Moist Heat;Traction;Ultrasound;DME Instruction;Functional mobility training;Therapeutic activities;Patient/family education;Neuromuscular re-education;Therapeutic exercise;Manual techniques;Energy conservation;Dry needling;Passive range of motion;Taping;Vestibular;Vasopneumatic Device;Visual/perceptual remediation/compensation;Canalith Repostioning;Balance training    PT Next Visit Plan nerve glides, STM, posture    PT Home Exercise Plan median and ulnar modified nerve glides    Consulted and Agree with Plan of Care Patient           Patient will benefit from skilled therapeutic intervention in order to improve the following deficits and impairments:  Decreased activity tolerance, Decreased mobility, Decreased range of motion, Decreased strength, Hypomobility, Impaired flexibility, Impaired perceived functional ability, Increased muscle spasms, Postural dysfunction, Improper body mechanics, Pain, Decreased knowledge of precautions, Decreased scar mobility  Visit Diagnosis: Cervicalgia  Abnormal posture  Radiculopathy, cervical region  Muscle weakness (generalized)     Problem List Patient Active Problem List   Diagnosis Date Noted  . PFO (patent foramen ovale)   . CAD (coronary artery disease), native coronary artery   . Acute CVA (cerebrovascular accident) ([place]) 06/15/2018  . Depression 10/16/2017  . Migraine 10/16/2017  . Hyperglycemia, unspecified 07/10/2017  . Hyperglycemia 07/10/2017  . H/O dizziness 08/02/2016  . Cervical disc disorder with radiculopathy of cervical region 04/12/2016  . Nonallopathic lesion of thoracic region 04/12/2016    . Nonallopathic lesion of rib cage 04/12/2016  . Facet arthritis of cervical region 03/21/2016  . Hyperlipidemia 01/26/2015  . Labral tear of shoulder, right, subsequent encounter 12/04/2014  . 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 Impingement syndrome of right shoulder 05/28/2014  . Right supraspinatus tenosynovitis 05/28/2014  . Arthritis pain 04/03/2014  . Fatigue 04/03/2014  . 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 IBS (irritable bowel syndrome) 06/20/2012  . Lumbar disc disease 06/20/2012   Janna Arch, PT, DPT   01/16/2020, 4:12 PM  [place] MAIN [facility] SERVICES [phone]  [place], [place], [address] Phone: [phone]   Fax:  [phone]  Name: Lori Morales MRN: [id number] Date of Birth: 1960/07/19

## 2020-01-30 ENCOUNTER — Ambulatory Visit: Payer: 59 | Attending: Internal Medicine

## 2020-01-30 ENCOUNTER — Other Ambulatory Visit: Payer: Self-pay

## 2020-01-30 DIAGNOSIS — M542 Cervicalgia: Secondary | ICD-10-CM | POA: Diagnosis not present

## 2020-01-30 DIAGNOSIS — M6281 Muscle weakness (generalized): Secondary | ICD-10-CM | POA: Insufficient documentation

## 2020-01-30 DIAGNOSIS — R293 Abnormal posture: Secondary | ICD-10-CM | POA: Insufficient documentation

## 2020-01-30 DIAGNOSIS — M5412 Radiculopathy, cervical region: Secondary | ICD-10-CM | POA: Diagnosis not present

## 2020-01-30 NOTE — Therapy (Signed)
[place] MAIN [facility] SERVICES [address] [place], [place], [address] Phone: [phone]   Fax:  [phone]  Physical Therapy Treatment  Patient Details  Name: Lori Morales MRN: [id number] Date of Birth: Mar 03, 1961 Referring Provider (PT): Dr. Earleen Newport    Encounter Date: 01/30/2020   PT End of Session - 01/30/20 1726    Visit Number 4    Number of Visits 16    Date for PT Re-Evaluation 03/04/20    Authorization Type 4/10 eval 01/08/20    PT Start Time 0845    PT Stop Time 0929    PT Time Calculation (min) 44 min    Activity Tolerance Patient tolerated treatment well;Other (comment)   lack of protocol   Behavior During Therapy [facility] for tasks assessed/performed           Past Medical History:  Diagnosis Date  . Arthritis   . CAD (coronary artery disease), native coronary artery    mild non calcified plaque in mid LAD and prox LCx on coronary CTA 01/2019  . Depression   . GERD (gastroesophageal reflux disease)   . Headache    migraines  . Hx of squamous cell carcinoma 07/11/2016   Right distal dorsum lat forearm   . Hypertension   . IBS (irritable bowel syndrome)   . Palpitations   . PFO (patent foramen ovale)    small by cardiac CTA 01/2019  . PONV (postoperative nausea and vomiting)   . Restless legs syndrome     Past Surgical History:  Procedure Laterality Date  . ABDOMINOPLASTY    . BACK SURGERY     1 laminectomy, 1 fusion  . COLONOSCOPY    . COLONOSCOPY WITH PROPOFOL N/A 01/17/2017   Procedure: COLONOSCOPY WITH PROPOFOL;  Surgeon: Lollie Sails, MD;  Location: [facility] ENDOSCOPY;  Service: Endoscopy;  Laterality: N/A;  . WISDOM TOOTH EXTRACTION      There were no vitals filed for this visit.   Subjective Assessment - 01/30/20 1723    Subjective Patient returning from trip, has new onset of numbness of pinky and ring finger. Bilateral achiness and falling asleep of arms. Steady tension of L more than R, L  shoulder pain is radiating to fingers.    Pertinent History Patient returning to PT after C6-7 arthroplasty with spur removal and discectomy on 12/06/19.  After one week started trying to quilt and had bad pain in her neck. Ever since surgery had pain in L side and shoulder with arm to forearm; can't sleep on L side right now. Is on a steroid taper now for reduction of pain. Was not given a protocol but was told no lifting more than 10lb, no cervical extension. Reports the surgeon wants to be conservative with treatment at the beginning. PMH of hyperglycemia, elevated BP, arthritis, HLD, restless legs, chronic insomnia, prior vertiginous migraine, thalamic CVA, anxiety, CAD, depression, GERD, hx of cancer, IBS, lumbar disc disease (prior back surgery with fusion x2 L4-5, S1), and cervical disc disease. 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    Limitations Sitting;Lifting;Standing;House hold activities;Reading    How long can you sit comfortably? neck pain limits sitting position    How long can you stand comfortably? n/a    How long can you walk comfortably? 2 miles    Patient Stated Goals decrease pain, improve ROM    Currently in Pain? Yes  Pain Score 5     Pain Location Neck    Pain Orientation Left;Right    Pain Descriptors / Indicators Aching;Radiating;Tender    Pain Type Neuropathic pain;Surgical pain    Pain Radiating Towards down L and R UE    Pain Onset More than a month ago    Pain Frequency Constant    Aggravating Factors  sitting, head movements    Pain Relieving Factors laying down, muscle relaxer    Effect of Pain on Daily Activities limits QOL              Patient returning from trip, has new onset of numbness of pinky and ring finger. Bilateral achiness and falling asleep of arms. Steady tension of L more than R, L shoulder pain is radiating to fingers.         STM  Nerve glides  supine: use of heat pad  under upper thoracic lower cervical region Cervical sidebend with gentle overpressure 3x30 second holds each direction  cerivcal rotation with gentle overpressure 3x 30 second holds each direction Trigger point with mobilization release to bicep with extension/flexion supination/pronation x 3 minutes LUE Median nerve glides 15x each UE (more limited on the L) Ulnar nerve glides 15x each UE (more limited on the L)   Prone:  Grade II-III mobilizations to thoracic spine with reduced hypomobility with repetition 4x 10 seconds each level J mobilization to upper thoracic/lower cervical for reduced thoracic kyphosis. 3x30 seconds STM with implementation of effleurage and pettrisage to levator scap, upper trap, cervical paraspinals, and rhomboids x 12 minutes   Seated: Thoracic extension keeping chin tucked for postural correction 10x Bilateral shoulder ER with YTB with scapular retraction for neutral postural alignment 10x YTB row for postural correction  Pt educated throughout session about proper posture and technique with exercises. Improved exercise technique, movement at target joints, use of target muscles after min to mod verbal, visual, tactile cues.                    PT Education - 01/30/20 1725    Education Details exercise technique, manual    Person(s) Educated Patient    Methods Explanation;Demonstration;Tactile cues;Verbal cues    Comprehension Verbalized understanding;Returned demonstration;Verbal cues required;Tactile cues required            PT Short Term Goals - 01/08/20 1809      PT SHORT TERM GOAL #1   Title Patient will be independent in home exercise program to improve strength/mobility/pain relief for better functional independence with ADLs.    Baseline 8/11; HEP given    Time 4    Period Weeks    Status New    Target Date 02/05/20             PT Long Term Goals - 01/08/20 1810      PT LONG TERM GOAL #1   Title Patient will report a  worst pain of 3/10 on VAS in cervical spine to improve tolerance with ADLs and reduced symptoms with activities.    Baseline 8/11: 8/10    Time 8    Period Weeks    Status New    Target Date 03/04/20      PT LONG TERM GOAL #2   Title Patient will increase FOTO score to equal to or greater than  50/100   to demonstrate statistically significant improvement in mobility and quality of life.    Baseline 8/11: 40%    Time 8  Period Weeks    Status New    Target Date 03/04/20      PT LONG TERM GOAL #3   Title Patient will reduce Neck Disability Index score to <10% to demonstrate minimal disability with ADL's including improved sleeping tolerance, sitting tolerance, etc for better mobility at home and work.    Baseline 8/11: 62%    Time 8    Period Weeks    Status New    Target Date 03/04/20      PT LONG TERM GOAL #4   Title Patient will increase cervical AROM within 10 degrees of functional range for improved function, ability to drive, and return to PLOF.    Baseline 8/11: extension deferred rotation: R 28 L 40, SB R 10 L 4    Time 8    Period Weeks    Status New    Target Date 03/04/20                 Plan - 01/30/20 1729    Clinical Impression Statement Patient presents with increased muscle guarding and pain from recent trip to [place]. Since patient has been seen last she has new onset of radiating pains and numbness down bilateral arms that began after her flight. Session today required focused manual and therex for posture, muscle tissue tension reduction and postural correction for tissue lengthening. Patient reports decreased radiating by end of session. Patient would benefit from skilled physical therapy to reduce pain, improve muscle tissue length and posture, and return patient to PLOF    Personal Factors and Comorbidities Age;Comorbidity 3+;Past/Current Experience;Profession;Social Background;Time since onset of injury/illness/exacerbation    Comorbidities  hyperglycemia, elevated BP, arthritis, HLD, restless legs, chronic insomnia, prior vertiginous migraine, thalamic CVA, anxiety, CAD, depression, GERD, hx of cancer, IBS, lumbar disc disease (prior back surgery with fusion x2 L4-5, S1), and cervical disc disease.    Examination-Activity Limitations Bed Mobility;Bend;Caring for Others;Carry;Dressing;Sleep;Reach Overhead;Lift;Hygiene/Grooming;Stairs;Bathing;Sit    Examination-Participation Restrictions Cleaning;Community Activity;Driving;Interpersonal Relationship;Laundry;Volunteer;Meal Prep;Yard Work;Other    Stability/Clinical Decision Making Evolving/Moderate complexity    Rehab Potential Fair    PT Frequency 2x / week    PT Duration 8 weeks    PT Treatment/Interventions ADLs/Self Care Home Management;Biofeedback;Cryotherapy;Electrical Stimulation;Iontophoresis 4mg /ml Dexamethasone;Moist Heat;Traction;Ultrasound;DME Instruction;Functional mobility training;Therapeutic activities;Patient/family education;Neuromuscular re-education;Therapeutic exercise;Manual techniques;Energy conservation;Dry needling;Passive range of motion;Taping;Vestibular;Vasopneumatic Device;Visual/perceptual remediation/compensation;Canalith Repostioning;Balance training    PT Next Visit Plan nerve glides, STM, posture    PT Home Exercise Plan median and ulnar modified nerve glides    Consulted and Agree with Plan of Care Patient           Patient will benefit from skilled therapeutic intervention in order to improve the following deficits and impairments:  Decreased activity tolerance, Decreased mobility, Decreased range of motion, Decreased strength, Hypomobility, Impaired flexibility, Impaired perceived functional ability, Increased muscle spasms, Postural dysfunction, Improper body mechanics, Pain, Decreased knowledge of precautions, Decreased scar mobility  Visit Diagnosis: Cervicalgia  Abnormal posture  Radiculopathy, cervical region  Muscle weakness  (generalized)     Problem List Patient Active Problem List   Diagnosis Date Noted  . PFO (patent foramen ovale)   . CAD (coronary artery disease), native coronary artery   . Acute CVA (cerebrovascular accident) ([place]) 06/15/2018  . Depression 10/16/2017  . Migraine 10/16/2017  . Hyperglycemia, unspecified 07/10/2017  . Hyperglycemia 07/10/2017  . H/O dizziness 08/02/2016  . Cervical disc disorder with radiculopathy of cervical region 04/12/2016  . Nonallopathic lesion of thoracic region 04/12/2016  . Nonallopathic lesion of rib cage  04/12/2016  . Facet arthritis of cervical region 03/21/2016  . Hyperlipidemia 01/26/2015  . Labral tear of shoulder, right, subsequent encounter 12/04/2014  . 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 Impingement syndrome of right shoulder 05/28/2014  . Right supraspinatus tenosynovitis 05/28/2014  . Arthritis pain 04/03/2014  . Fatigue 04/03/2014  . 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 IBS (irritable bowel syndrome) 06/20/2012  . Lumbar disc disease 06/20/2012   Janna Arch, PT, DPT   01/30/2020, 5:30 PM  [place] MAIN [facility] SERVICES [address] [place], [place], [address] Phone: [phone]   Fax:  [phone]  Name: SHI GROSE MRN: [id number] Date of Birth: 02/12/1961

## 2020-02-06 ENCOUNTER — Ambulatory Visit: Payer: 59

## 2020-02-06 ENCOUNTER — Other Ambulatory Visit: Payer: Self-pay

## 2020-02-06 DIAGNOSIS — R293 Abnormal posture: Secondary | ICD-10-CM

## 2020-02-06 DIAGNOSIS — M5412 Radiculopathy, cervical region: Secondary | ICD-10-CM

## 2020-02-06 DIAGNOSIS — M542 Cervicalgia: Secondary | ICD-10-CM

## 2020-02-06 DIAGNOSIS — M6281 Muscle weakness (generalized): Secondary | ICD-10-CM | POA: Diagnosis not present

## 2020-02-06 NOTE — Therapy (Signed)
[place] MAIN [facility] SERVICES [address] [place], [place], [address] Phone: [phone]   Fax:  [phone]  Physical Therapy Treatment  Patient Details  Name: Lori Morales MRN: [id number] Date of Birth: 1961/02/25 Referring Provider (PT): Dr. Earleen Newport    Encounter Date: 02/06/2020   PT End of Session - 02/06/20 1653    Visit Number 5    Number of Visits 16    Date for PT Re-Evaluation 03/04/20    Authorization Type 5/10 eval 01/08/20    PT Start Time 0845    PT Stop Time 0929    PT Time Calculation (min) 44 min    Activity Tolerance Patient tolerated treatment well;Other (comment)   lack of protocol   Behavior During Therapy [facility] for tasks assessed/performed           Past Medical History:  Diagnosis Date   Arthritis    CAD (coronary artery disease), native coronary artery    mild non calcified plaque in mid LAD and prox LCx on coronary CTA 01/2019   Depression    GERD (gastroesophageal reflux disease)    Headache    migraines   Hx of squamous cell carcinoma 07/11/2016   Right distal dorsum lat forearm    Hypertension    IBS (irritable bowel syndrome)    Palpitations    PFO (patent foramen ovale)    small by cardiac CTA 01/2019   PONV (postoperative nausea and vomiting)    Restless legs syndrome     Past Surgical History:  Procedure Laterality Date   ABDOMINOPLASTY     BACK SURGERY     1 laminectomy, 1 fusion   COLONOSCOPY     COLONOSCOPY WITH PROPOFOL N/A 01/17/2017   Procedure: COLONOSCOPY WITH PROPOFOL;  Surgeon: Lollie Sails, MD;  Location: [facility] ENDOSCOPY;  Service: Endoscopy;  Laterality: N/A;   WISDOM TOOTH EXTRACTION      There were no vitals filed for this visit.   Subjective Assessment - 02/06/20 1643    Subjective Patient reports having stiffness but decreased pain levels. continues to have radiation of symptoms.    Pertinent History Patient returning to PT after C6-7  arthroplasty with spur removal and discectomy on 12/06/19.  After one week started trying to quilt and had bad pain in her neck. Ever since surgery had pain in L side and shoulder with arm to forearm; can't sleep on L side right now. Is on a steroid taper now for reduction of pain. Was not given a protocol but was told no lifting more than 10lb, no cervical extension. Reports the surgeon wants to be conservative with treatment at the beginning. PMH of hyperglycemia, elevated BP, arthritis, HLD, restless legs, chronic insomnia, prior vertiginous migraine, thalamic CVA, anxiety, CAD, depression, GERD, hx of cancer, IBS, lumbar disc disease (prior back surgery with fusion x2 L4-5, S1), and cervical disc disease. 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    Limitations Sitting;Lifting;Standing;House hold activities;Reading    How long can you sit comfortably? neck pain limits sitting position    How long can you stand comfortably? n/a    How long can you walk comfortably? 2 miles    Patient Stated Goals decrease pain, improve ROM    Currently in Pain? Yes    Pain Score 2     Pain Location Neck    Pain Orientation Right;Left    Pain  Descriptors / Indicators Aching;Pressure    Pain Type Neuropathic pain;Surgical pain    Pain Onset More than a month ago    Pain Frequency Constant             supine: use of heat pad under upper thoracic lower cervical region Cervical sidebend with gentle overpressure 3x30 second holds each direction  cerivcal rotation with gentle overpressure 3x 30 second holds each direction Suboccipital release 3x30 second holds  Trigger point with mobilization release to bicep with extension/flexion supination/pronation x 3 minutes LUE First rib mobilization 5x 20 seconds grade II mobilizations each side Median nerve glides 15x each UE (more limited on the L) Ulnar nerve glides 15x each UE (more limited on the L) chin  tucks 10x 3 second holds  Prone:  Grade II-III mobilizations to thoracic spine with reduced hypomobility with repetition 4x 10 seconds each level J mobilization to upper thoracic/lower cervical for reduced thoracic kyphosis. 3x30 seconds  Seated: Thoracic extension keeping chin tucked for postural correction 10x RTB row for postural correction 15x Upper trap self stretch with one hand behind one hand on head for control: 3 head positions 20 second holds each, both sides  STM with implementation of effleurage and pettrisage to levator scap, upper trap, cervical paraspinals, and rhomboids x 12 minutes    Pt educated throughout session about proper posture and technique with exercises. Improved exercise technique, movement at target joints, use of target muscles after min to mod verbal, visual, tactile cues.                            PT Education - 02/06/20 1644    Education Details exercise technique, manual    Person(s) Educated Patient    Methods Explanation;Demonstration;Tactile cues;Verbal cues    Comprehension Verbalized understanding;Returned demonstration;Verbal cues required;Tactile cues required            PT Short Term Goals - 01/08/20 1809      PT SHORT TERM GOAL #1   Title Patient will be independent in home exercise program to improve strength/mobility/pain relief for better functional independence with ADLs.    Baseline 8/11; HEP given    Time 4    Period Weeks    Status New    Target Date 02/05/20             PT Long Term Goals - 01/08/20 1810      PT LONG TERM GOAL #1   Title Patient will report a worst pain of 3/10 on VAS in cervical spine to improve tolerance with ADLs and reduced symptoms with activities.    Baseline 8/11: 8/10    Time 8    Period Weeks    Status New    Target Date 03/04/20      PT LONG TERM GOAL #2   Title Patient will increase FOTO score to equal to or greater than  50/100   to demonstrate statistically  significant improvement in mobility and quality of life.    Baseline 8/11: 40%    Time 8    Period Weeks    Status New    Target Date 03/04/20      PT LONG TERM GOAL #3   Title Patient will reduce Neck Disability Index score to <10% to demonstrate minimal disability with ADLs including improved sleeping tolerance, sitting tolerance, etc for better mobility at home and work.    Baseline 8/11: 62%    Time 8  Period Weeks    Status New    Target Date 03/04/20      PT LONG TERM GOAL #4   Title Patient will increase cervical AROM within 10 degrees of functional range for improved function, ability to drive, and return to PLOF.    Baseline 8/11: extension deferred rotation: R 28 L 40, SB R 10 L 4    Time 8    Period Weeks    Status New    Target Date 03/04/20                 Plan - 02/06/20 1659    Clinical Impression Statement Patient presents with improved muscle guarding however continues to experience radiating symptoms bilaterally. First rib mobilization reduce radiation as well as chin tucks resulting in increased centralization of symptoms. Patient educated on upper trap stretches for home with good demonstration of safe performance. Patient reports decreased radiating by end of session. Patient would benefit from skilled physical therapy to reduce pain, improve muscle tissue length and posture, and return patient to PLOF    Personal Factors and Comorbidities Age;Comorbidity 3+;Past/Current Experience;Profession;Social Background;Time since onset of injury/illness/exacerbation    Comorbidities hyperglycemia, elevated BP, arthritis, HLD, restless legs, chronic insomnia, prior vertiginous migraine, thalamic CVA, anxiety, CAD, depression, GERD, hx of cancer, IBS, lumbar disc disease (prior back surgery with fusion x2 L4-5, S1), and cervical disc disease.    Examination-Activity Limitations Bed Mobility;Bend;Caring for Others;Carry;Dressing;Sleep;Reach  Overhead;Lift;Hygiene/Grooming;Stairs;Bathing;Sit    Examination-Participation Restrictions Cleaning;Community Activity;Driving;Interpersonal Relationship;Laundry;Volunteer;Meal Prep;Yard Work;Other    Stability/Clinical Decision Making Evolving/Moderate complexity    Rehab Potential Fair    PT Frequency 2x / week    PT Duration 8 weeks    PT Treatment/Interventions ADLs/Self Care Home Management;Biofeedback;Cryotherapy;Electrical Stimulation;Iontophoresis 4mg /ml Dexamethasone;Moist Heat;Traction;Ultrasound;DME Instruction;Functional mobility training;Therapeutic activities;Patient/family education;Neuromuscular re-education;Therapeutic exercise;Manual techniques;Energy conservation;Dry needling;Passive range of motion;Taping;Vestibular;Vasopneumatic Device;Visual/perceptual remediation/compensation;Canalith Repostioning;Balance training    PT Next Visit Plan nerve glides, STM, posture    PT Home Exercise Plan median and ulnar modified nerve glides    Consulted and Agree with Plan of Care Patient           Patient will benefit from skilled therapeutic intervention in order to improve the following deficits and impairments:  Decreased activity tolerance, Decreased mobility, Decreased range of motion, Decreased strength, Hypomobility, Impaired flexibility, Impaired perceived functional ability, Increased muscle spasms, Postural dysfunction, Improper body mechanics, Pain, Decreased knowledge of precautions, Decreased scar mobility  Visit Diagnosis: Cervicalgia  Abnormal posture  Radiculopathy, cervical region  Muscle weakness (generalized)     Problem List Patient Active Problem List   Diagnosis Date Noted   PFO (patent foramen ovale)    CAD (coronary artery disease), native coronary artery    Acute CVA (cerebrovascular accident) ([place]) 06/15/2018   Depression 10/16/2017   Migraine 10/16/2017   Hyperglycemia, unspecified 07/10/2017   Hyperglycemia 07/10/2017   H/O dizziness  08/02/2016   Cervical disc disorder with radiculopathy of cervical region 04/12/2016   Nonallopathic lesion of thoracic region 04/12/2016   Nonallopathic lesion of rib cage 04/12/2016   Facet arthritis of cervical region 03/21/2016   Hyperlipidemia 01/26/2015   Labral tear of shoulder, right, subsequent encounter 12/04/2014   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 [place], [place], [address] Phone: [phone]   Fax:  [phone]  Name: KAILONI VAHLE MRN: [id number] Date of Birth: November 10, 1960

## 2020-02-11 ENCOUNTER — Ambulatory Visit: Payer: 59

## 2020-02-11 ENCOUNTER — Other Ambulatory Visit: Payer: Self-pay

## 2020-02-11 DIAGNOSIS — M5412 Radiculopathy, cervical region: Secondary | ICD-10-CM | POA: Diagnosis not present

## 2020-02-11 DIAGNOSIS — R293 Abnormal posture: Secondary | ICD-10-CM

## 2020-02-11 DIAGNOSIS — M6281 Muscle weakness (generalized): Secondary | ICD-10-CM | POA: Diagnosis not present

## 2020-02-11 DIAGNOSIS — M542 Cervicalgia: Secondary | ICD-10-CM | POA: Diagnosis not present

## 2020-02-11 MED FILL — LOSARTAN POTASSIUM 25 MG TA: 25 | 90 days supply | Qty: 90 | Fill #1

## 2020-02-11 NOTE — Therapy (Signed)
[place] MAIN [facility] SERVICES [address] [place], [place], [address] Phone: [phone]   Fax:  [phone]  Physical Therapy Treatment  Patient Details  Name: Lori Morales MRN: [id number] Date of Birth: 28-Jan-1961 Referring Provider (PT): Dr. Earleen Newport    Encounter Date: 02/11/2020   PT End of Session - 02/11/20 1210    Visit Number 6    Number of Visits 16    Date for PT Re-Evaluation 03/04/20    Authorization Type 6/10 eval 01/08/20    PT Start Time 1100    PT Stop Time 1142    PT Time Calculation (min) 42 min    Activity Tolerance Patient tolerated treatment well;Other (comment)   lack of protocol   Behavior During Therapy [facility] for tasks assessed/performed           Past Medical History:  Diagnosis Date  . Arthritis   . CAD (coronary artery disease), native coronary artery    mild non calcified plaque in mid LAD and prox LCx on coronary CTA 01/2019  . Depression   . GERD (gastroesophageal reflux disease)   . Headache    migraines  . Hx of squamous cell carcinoma 07/11/2016   Right distal dorsum lat forearm   . Hypertension   . IBS (irritable bowel syndrome)   . Palpitations   . PFO (patent foramen ovale)    small by cardiac CTA 01/2019  . PONV (postoperative nausea and vomiting)   . Restless legs syndrome     Past Surgical History:  Procedure Laterality Date  . ABDOMINOPLASTY    . BACK SURGERY     1 laminectomy, 1 fusion  . COLONOSCOPY    . COLONOSCOPY WITH PROPOFOL N/A 01/17/2017   Procedure: COLONOSCOPY WITH PROPOFOL;  Surgeon: Lollie Sails, MD;  Location: [facility] ENDOSCOPY;  Service: Endoscopy;  Laterality: N/A;  . WISDOM TOOTH EXTRACTION      There were no vitals filed for this visit.   Subjective Assessment - 02/11/20 1208    Subjective Patient reports that since sunday she has started feeling better and is having less pain. Still has some radiation of symptoms and pain.    Pertinent History Patient  returning to PT after C6-7 arthroplasty with spur removal and discectomy on 12/06/19.  After one week started trying to quilt and had bad pain in her neck. Ever since surgery had pain in L side and shoulder with arm to forearm; can't sleep on L side right now. Is on a steroid taper now for reduction of pain. Was not given a protocol but was told no lifting more than 10lb, no cervical extension. Reports the surgeon wants to be conservative with treatment at the beginning. PMH of hyperglycemia, elevated BP, arthritis, HLD, restless legs, chronic insomnia, prior vertiginous migraine, thalamic CVA, anxiety, CAD, depression, GERD, hx of cancer, IBS, lumbar disc disease (prior back surgery with fusion x2 L4-5, S1), and cervical disc disease. 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    Limitations Sitting;Lifting;Standing;House hold activities;Reading    How long can you sit comfortably? neck pain limits sitting position    How long can you stand comfortably? n/a    How long can you walk comfortably? 2 miles    Patient Stated Goals decrease pain, improve ROM    Currently in Pain? Yes    Pain Score 1     Pain Location Neck  Pain Orientation Right;Left    Pain Descriptors / Indicators Aching    Pain Type Acute pain;Neuropathic pain    Pain Onset More than a month ago    Pain Frequency Constant                       supine: use of heat pad under upper thoracic lower cervical region Cervical sidebend with gentle overpressure 3x30 second holds each direction  cerivcal rotation with gentle overpressure 3x 30 second holds each direction Suboccipital release 3x30 second holds  Cervical AROM rotation 3x30 seconds Cervical AROM sidebending 3x30 seconds  First rib mobilization 5x 20 seconds grade II mobilizations each side Median nerve glides 15x each UE (more limited on the L) Ulnar nerve glides 15x each UE (more limited on the L) chin  tucks 10x 3 second holds  RTB ER 15x with focus on scapular retraction RTB overhead flexion and abduction with resistance, 15x  Prone:  Grade II-III mobilizations to thoracic spine with reduced hypomobility with repetition 4x 10 seconds each level J mobilization to upper thoracic/lower cervical for reduced thoracic kyphosis. 3x30 seconds   Seated: Seated towel stretch cervical rotation 8x each side 10 second holds, side bending 8x each side 10 second holds  STM with implementation of effleurage and pettrisage to levator scap, upper trap, cervical paraspinals, and rhomboids x 12 minutes     Pt educated throughout session about proper posture and technique with exercises. Improved exercise technique, movement at target joints, use of target muscles after min to mod verbal, visual, tactile cues.                PT Education - 02/11/20 1209    Education Details exercise technique, body mechanics, manual    Person(s) Educated Patient    Methods Explanation;Demonstration;Tactile cues;Verbal cues    Comprehension Verbalized understanding;Returned demonstration;Verbal cues required;Tactile cues required            PT Short Term Goals - 01/08/20 1809      PT SHORT TERM GOAL #1   Title Patient will be independent in home exercise program to improve strength/mobility/pain relief for better functional independence with ADLs.    Baseline 8/11; HEP given    Time 4    Period Weeks    Status New    Target Date 02/05/20             PT Long Term Goals - 01/08/20 1810      PT LONG TERM GOAL #1   Title Patient will report a worst pain of 3/10 on VAS in cervical spine to improve tolerance with ADLs and reduced symptoms with activities.    Baseline 8/11: 8/10    Time 8    Period Weeks    Status New    Target Date 03/04/20      PT LONG TERM GOAL #2   Title Patient will increase FOTO score to equal to or greater than  50/100   to demonstrate statistically significant improvement  in mobility and quality of life.    Baseline 8/11: 40%    Time 8    Period Weeks    Status New    Target Date 03/04/20      PT LONG TERM GOAL #3   Title Patient will reduce Neck Disability Index score to <10% to demonstrate minimal disability with ADL's including improved sleeping tolerance, sitting tolerance, etc for better mobility at home and work.    Baseline 8/11: 62%  Time 8    Period Weeks    Status New    Target Date 03/04/20      PT LONG TERM GOAL #4   Title Patient will increase cervical AROM within 10 degrees of functional range for improved function, ability to drive, and return to PLOF.    Baseline 8/11: extension deferred rotation: R 28 L 40, SB R 10 L 4    Time 8    Period Weeks    Status New    Target Date 03/04/20                 Plan - 02/11/20 1215    Clinical Impression Statement Patient progressing with functional movement with introduction to AROM in seated and supine as well as resisted interventions. Patient is increasing in range of passive movements as well and experiencing decreased radiating symptoms. Postural correction is becoming more intrinsic rather than extrinsic in cues. Patient would benefit from skilled physical therapy to reduce pain, improve muscle tissue length and posture, and return patient to PLOF    Personal Factors and Comorbidities Age;Comorbidity 3+;Past/Current Experience;Profession;Social Background;Time since onset of injury/illness/exacerbation    Comorbidities hyperglycemia, elevated BP, arthritis, HLD, restless legs, chronic insomnia, prior vertiginous migraine, thalamic CVA, anxiety, CAD, depression, GERD, hx of cancer, IBS, lumbar disc disease (prior back surgery with fusion x2 L4-5, S1), and cervical disc disease.    Examination-Activity Limitations Bed Mobility;Bend;Caring for Others;Carry;Dressing;Sleep;Reach Overhead;Lift;Hygiene/Grooming;Stairs;Bathing;Sit    Examination-Participation Restrictions Cleaning;Community  Activity;Driving;Interpersonal Relationship;Laundry;Volunteer;Meal Prep;Yard Work;Other    Stability/Clinical Decision Making Evolving/Moderate complexity    Rehab Potential Fair    PT Frequency 2x / week    PT Duration 8 weeks    PT Treatment/Interventions ADLs/Self Care Home Management;Biofeedback;Cryotherapy;Electrical Stimulation;Iontophoresis 4mg /ml Dexamethasone;Moist Heat;Traction;Ultrasound;DME Instruction;Functional mobility training;Therapeutic activities;Patient/family education;Neuromuscular re-education;Therapeutic exercise;Manual techniques;Energy conservation;Dry needling;Passive range of motion;Taping;Vestibular;Vasopneumatic Device;Visual/perceptual remediation/compensation;Canalith Repostioning;Balance training    PT Next Visit Plan nerve glides, STM, posture    PT Home Exercise Plan median and ulnar modified nerve glides    Consulted and Agree with Plan of Care Patient           Patient will benefit from skilled therapeutic intervention in order to improve the following deficits and impairments:  Decreased activity tolerance, Decreased mobility, Decreased range of motion, Decreased strength, Hypomobility, Impaired flexibility, Impaired perceived functional ability, Increased muscle spasms, Postural dysfunction, Improper body mechanics, Pain, Decreased knowledge of precautions, Decreased scar mobility  Visit Diagnosis: Cervicalgia  Abnormal posture  Radiculopathy, cervical region  Muscle weakness (generalized)     Problem List Patient Active Problem List   Diagnosis Date Noted  . PFO (patent foramen ovale)   . CAD (coronary artery disease), native coronary artery   . Acute CVA (cerebrovascular accident) ([place]) 06/15/2018  . Depression 10/16/2017  . Migraine 10/16/2017  . Hyperglycemia, unspecified 07/10/2017  . Hyperglycemia 07/10/2017  . H/O dizziness 08/02/2016  . Cervical disc disorder with radiculopathy of cervical region 04/12/2016  . Nonallopathic lesion  of thoracic region 04/12/2016  . Nonallopathic lesion of rib cage 04/12/2016  . Facet arthritis of cervical region 03/21/2016  . Hyperlipidemia 01/26/2015  . Labral tear of shoulder, right, subsequent encounter 12/04/2014  . 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 Impingement syndrome of right shoulder 05/28/2014  . Right supraspinatus tenosynovitis 05/28/2014  . Arthritis pain 04/03/2014  . Fatigue 04/03/2014  . 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 IBS (irritable bowel syndrome) 06/20/2012  . Lumbar disc disease 06/20/2012   Janna Arch, PT, DPT   02/11/2020, 12:19 PM  [place] MAIN [facility] SERVICES [address] [place], [place], [address] Phone: [phone]   Fax:  [phone]  Name: DAVAN HARK MRN: [id number] Date of Birth: 1961-05-21

## 2020-02-13 MED FILL — PANTOPRAZOLE SOD DR 40 MG T: 40 | 90 days supply | Qty: 90 | Fill #1

## 2020-02-18 ENCOUNTER — Ambulatory Visit: Payer: 59

## 2020-02-18 ENCOUNTER — Other Ambulatory Visit: Payer: Self-pay

## 2020-02-18 DIAGNOSIS — R293 Abnormal posture: Secondary | ICD-10-CM

## 2020-02-18 DIAGNOSIS — M519 Unspecified thoracic, thoracolumbar and lumbosacral intervertebral disc disorder: Secondary | ICD-10-CM | POA: Diagnosis not present

## 2020-02-18 DIAGNOSIS — M542 Cervicalgia: Secondary | ICD-10-CM

## 2020-02-18 DIAGNOSIS — M5412 Radiculopathy, cervical region: Secondary | ICD-10-CM | POA: Diagnosis not present

## 2020-02-18 DIAGNOSIS — R739 Hyperglycemia, unspecified: Secondary | ICD-10-CM | POA: Diagnosis not present

## 2020-02-18 DIAGNOSIS — E7849 Other hyperlipidemia: Secondary | ICD-10-CM | POA: Diagnosis not present

## 2020-02-18 DIAGNOSIS — M6281 Muscle weakness (generalized): Secondary | ICD-10-CM | POA: Diagnosis not present

## 2020-02-18 DIAGNOSIS — I2510 Atherosclerotic heart disease of native coronary artery without angina pectoris: Secondary | ICD-10-CM | POA: Diagnosis not present

## 2020-02-18 DIAGNOSIS — I10 Essential (primary) hypertension: Secondary | ICD-10-CM | POA: Diagnosis not present

## 2020-02-18 NOTE — Therapy (Signed)
[place] MAIN [facility] SERVICES [address] [place], [place], [address] Phone: [phone]   Fax:  [phone]  Physical Therapy Treatment  Patient Details  Name: Lori Morales MRN: [id number] Date of Birth: 1961/01/26 Referring Provider (PT): Dr. Earleen Newport    Encounter Date: 02/18/2020   PT End of Session - 02/18/20 1005    Visit Number 7    Number of Visits 16    Date for PT Re-Evaluation 03/04/20    Authorization Type 7/10 eval 01/08/20    PT Start Time 0846    PT Stop Time 0929    PT Time Calculation (min) 43 min    Activity Tolerance Patient tolerated treatment well   lack of protocol   Behavior During Therapy [facility] for tasks assessed/performed           Past Medical History:  Diagnosis Date  . Arthritis   . CAD (coronary artery disease), native coronary artery    mild non calcified plaque in mid LAD and prox LCx on coronary CTA 01/2019  . Depression   . GERD (gastroesophageal reflux disease)   . Headache    migraines  . Hx of squamous cell carcinoma 07/11/2016   Right distal dorsum lat forearm   . Hypertension   . IBS (irritable bowel syndrome)   . Palpitations   . PFO (patent foramen ovale)    small by cardiac CTA 01/2019  . PONV (postoperative nausea and vomiting)   . Restless legs syndrome     Past Surgical History:  Procedure Laterality Date  . ABDOMINOPLASTY    . BACK SURGERY     1 laminectomy, 1 fusion  . COLONOSCOPY    . COLONOSCOPY WITH PROPOFOL N/A 01/17/2017   Procedure: COLONOSCOPY WITH PROPOFOL;  Surgeon: Lollie Sails, MD;  Location: [facility] ENDOSCOPY;  Service: Endoscopy;  Laterality: N/A;  . WISDOM TOOTH EXTRACTION      There were no vitals filed for this visit.   Subjective Assessment - 02/18/20 1003    Subjective Patient's physician has returned this therapists call. Patient went to the beach over the weekend and is now starting work at part time/half day to reduce risk of regression with  return to work, started yesterday.    Pertinent History Patient returning to PT after C6-7 arthroplasty with spur removal and discectomy on 12/06/19.  After one week started trying to quilt and had bad pain in her neck. Ever since surgery had pain in L side and shoulder with arm to forearm; can't sleep on L side right now. Is on a steroid taper now for reduction of pain. Was not given a protocol but was told no lifting more than 10lb, no cervical extension. Reports the surgeon wants to be conservative with treatment at the beginning. PMH of hyperglycemia, elevated BP, arthritis, HLD, restless legs, chronic insomnia, prior vertiginous migraine, thalamic CVA, anxiety, CAD, depression, GERD, hx of cancer, IBS, lumbar disc disease (prior back surgery with fusion x2 L4-5, S1), and cervical disc disease. 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    Limitations Sitting;Lifting;Standing;House hold activities;Reading    How long can you sit comfortably? neck pain limits sitting position    How long can you stand comfortably? n/a    How long can you walk comfortably? 2 miles    Patient Stated Goals decrease pain, improve ROM    Currently in Pain? Yes  Pain Score 2     Pain Location Neck    Pain Orientation Right;Left    Pain Descriptors / Indicators Aching    Pain Type Chronic pain;Surgical pain    Pain Onset More than a month ago    Pain Frequency Intermittent    Effect of Pain on Daily Activities affects sleeping                   supine: use of heat pad under upper thoracic lower cervical region Cervical sidebend with gentle overpressure 3x30 second holds each direction  cerivcal rotation with gentle overpressure 3x 30 second holds each direction Suboccipital release 3x30 second holds  Cervical AROM rotation 3x30 seconds Cervical AROM sidebending 3x30 seconds Cervical AROM extension 10x 3 second hold  First rib mobilization 3x 20  seconds grade II mobilizations each side Median nerve glides 15x each UE (more limited on the L) chin tucks 10x 3 second holds    Prone:  Grade II-III mobilizations to thoracic spine with reduced hypomobility with repetition 4x 10 seconds each level J mobilization to upper thoracic/lower cervical for reduced thoracic kyphosis. 3x30 seconds  Inferio-medial rib glides L grade II-III 6x 10 seconds in varying planes of motion for reduction of tension and improve posture; hypomobile and tender initially in rib 6 and 7.   Seated: K tape applied to L shoulder/scapula for inferior glide of scapula, retraction of scapula and glenohumeral positioning for optimal alignment   Education on K tape wear: not to wear in shower, take off prior to bedtime, etc. Patient verbalized understanding   Pt educated throughout session about proper posture and technique with exercises. Improved exercise technique, movement at target joints, use of target muscles after min to mod verbal, visual, tactile cues.                    PT Education - 02/18/20 1004    Education Details exercise technique, extension, K tape    Person(s) Educated Patient    Methods Explanation;Demonstration;Tactile cues;Verbal cues    Comprehension Verbalized understanding;Returned demonstration;Verbal cues required;Tactile cues required            PT Short Term Goals - 01/08/20 1809      PT SHORT TERM GOAL #1   Title Patient will be independent in home exercise program to improve strength/mobility/pain relief for better functional independence with ADLs.    Baseline 8/11; HEP given    Time 4    Period Weeks    Status New    Target Date 02/05/20             PT Long Term Goals - 01/08/20 1810      PT LONG TERM GOAL #1   Title Patient will report a worst pain of 3/10 on VAS in cervical spine to improve tolerance with ADLs and reduced symptoms with activities.    Baseline 8/11: 8/10    Time 8    Period Weeks     Status New    Target Date 03/04/20      PT LONG TERM GOAL #2   Title Patient will increase FOTO score to equal to or greater than  50/100   to demonstrate statistically significant improvement in mobility and quality of life.    Baseline 8/11: 40%    Time 8    Period Weeks    Status New    Target Date 03/04/20      PT LONG TERM GOAL #3   Title  Patient will reduce Neck Disability Index score to <10% to demonstrate minimal disability with ADL's including improved sleeping tolerance, sitting tolerance, etc for better mobility at home and work.    Baseline 8/11: 62%    Time 8    Period Weeks    Status New    Target Date 03/04/20      PT LONG TERM GOAL #4   Title Patient will increase cervical AROM within 10 degrees of functional range for improved function, ability to drive, and return to PLOF.    Baseline 8/11: extension deferred rotation: R 28 L 40, SB R 10 L 4    Time 8    Period Weeks    Status New    Target Date 03/04/20                 Plan - 02/18/20 1006    Clinical Impression Statement Patient's plan of care discussed with surgeon prior to this visit, is now cleared for active extension and all ROM. Patient introduced to AROM extension in supine position for reduction of tension/spasm. Patient hypomobile with pain in L  Ribs, especially 6th and 7th rib. Patient would benefit from skilled physical therapy to reduce pain, improve muscle tissue length and posture, and return patient to PLOF    Personal Factors and Comorbidities Age;Comorbidity 3+;Past/Current Experience;Profession;Social Background;Time since onset of injury/illness/exacerbation    Comorbidities hyperglycemia, elevated BP, arthritis, HLD, restless legs, chronic insomnia, prior vertiginous migraine, thalamic CVA, anxiety, CAD, depression, GERD, hx of cancer, IBS, lumbar disc disease (prior back surgery with fusion x2 L4-5, S1), and cervical disc disease.    Examination-Activity Limitations Bed  Mobility;Bend;Caring for Others;Carry;Dressing;Sleep;Reach Overhead;Lift;Hygiene/Grooming;Stairs;Bathing;Sit    Examination-Participation Restrictions Cleaning;Community Activity;Driving;Interpersonal Relationship;Laundry;Volunteer;Meal Prep;Yard Work;Other    Stability/Clinical Decision Making Evolving/Moderate complexity    Rehab Potential Fair    PT Frequency 2x / week    PT Duration 8 weeks    PT Treatment/Interventions ADLs/Self Care Home Management;Biofeedback;Cryotherapy;Electrical Stimulation;Iontophoresis 4mg /ml Dexamethasone;Moist Heat;Traction;Ultrasound;DME Instruction;Functional mobility training;Therapeutic activities;Patient/family education;Neuromuscular re-education;Therapeutic exercise;Manual techniques;Energy conservation;Dry needling;Passive range of motion;Taping;Vestibular;Vasopneumatic Device;Visual/perceptual remediation/compensation;Canalith Repostioning;Balance training    PT Next Visit Plan nerve glides, STM, posture    PT Home Exercise Plan median and ulnar modified nerve glides    Consulted and Agree with Plan of Care Patient           Patient will benefit from skilled therapeutic intervention in order to improve the following deficits and impairments:  Decreased activity tolerance, Decreased mobility, Decreased range of motion, Decreased strength, Hypomobility, Impaired flexibility, Impaired perceived functional ability, Increased muscle spasms, Postural dysfunction, Improper body mechanics, Pain, Decreased knowledge of precautions, Decreased scar mobility  Visit Diagnosis: Cervicalgia  Abnormal posture  Radiculopathy, cervical region     Problem List Patient Active Problem List   Diagnosis Date Noted  . PFO (patent foramen ovale)   . CAD (coronary artery disease), native coronary artery   . Acute CVA (cerebrovascular accident) ([place]) 06/15/2018  . Depression 10/16/2017  . Migraine 10/16/2017  . Hyperglycemia, unspecified 07/10/2017  . Hyperglycemia  07/10/2017  . H/O dizziness 08/02/2016  . Cervical disc disorder with radiculopathy of cervical region 04/12/2016  . Nonallopathic lesion of thoracic region 04/12/2016  . Nonallopathic lesion of rib cage 04/12/2016  . Facet arthritis of cervical region 03/21/2016  . Hyperlipidemia 01/26/2015  . Labral tear of shoulder, right, subsequent encounter 12/04/2014  . 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 Impingement syndrome of right shoulder 05/28/2014  . Right supraspinatus tenosynovitis 05/28/2014  . Arthritis pain 04/03/2014  .  Fatigue 04/03/2014  . 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 IBS (irritable bowel syndrome) 06/20/2012  . Lumbar disc disease 06/20/2012   Janna Arch, PT, DPT   02/18/2020, 10:07 AM  [place] MAIN [facility] SERVICES [address] [place], [place], [address] Phone: [phone]   Fax:  [phone]  Name: Lori Morales MRN: [id number] Date of Birth: Aug 24, 1960

## 2020-02-20 ENCOUNTER — Ambulatory Visit: Payer: 59

## 2020-02-20 ENCOUNTER — Other Ambulatory Visit: Payer: Self-pay

## 2020-02-20 DIAGNOSIS — M5412 Radiculopathy, cervical region: Secondary | ICD-10-CM | POA: Diagnosis not present

## 2020-02-20 DIAGNOSIS — M542 Cervicalgia: Secondary | ICD-10-CM | POA: Diagnosis not present

## 2020-02-20 DIAGNOSIS — R293 Abnormal posture: Secondary | ICD-10-CM

## 2020-02-20 DIAGNOSIS — M6281 Muscle weakness (generalized): Secondary | ICD-10-CM | POA: Diagnosis not present

## 2020-02-20 NOTE — Therapy (Signed)
[place] MAIN [employer] SERVICES [address] [place], [place], [address] Phone: [phone]   Fax:  [phone]  Physical Therapy Treatment  Patient Details  Name: Lori Morales MRN: [id number] Date of Birth: 12-17-1960 Referring Provider (PT): Dr. Earleen Newport    Encounter Date: 02/20/2020   PT End of Session - 02/21/20 0802    Visit Number 8    Number of Visits 16    Date for PT Re-Evaluation 03/04/20    Authorization Type 8/10 eval 01/08/20    PT Start Time 0850    PT Stop Time 0930    PT Time Calculation (min) 40 min    Activity Tolerance Patient tolerated treatment well   lack of protocol   Behavior During Therapy [facility] for tasks assessed/performed           Past Medical History:  Diagnosis Date  . Arthritis   . CAD (coronary artery disease), native coronary artery    mild non calcified plaque in mid LAD and prox LCx on coronary CTA 01/2019  . Depression   . GERD (gastroesophageal reflux disease)   . Headache    migraines  . Hx of squamous cell carcinoma 07/11/2016   Right distal dorsum lat forearm   . Hypertension   . IBS (irritable bowel syndrome)   . Palpitations   . PFO (patent foramen ovale)    small by cardiac CTA 01/2019  . PONV (postoperative nausea and vomiting)   . Restless legs syndrome     Past Surgical History:  Procedure Laterality Date  . ABDOMINOPLASTY    . BACK SURGERY     1 laminectomy, 1 fusion  . COLONOSCOPY    . COLONOSCOPY WITH PROPOFOL N/A 01/17/2017   Procedure: COLONOSCOPY WITH PROPOFOL;  Surgeon: Lollie Sails, MD;  Location: [facility] ENDOSCOPY;  Service: Endoscopy;  Laterality: N/A;  . WISDOM TOOTH EXTRACTION      There were no vitals filed for this visit.   Subjective Assessment - 02/21/20 0800    Subjective Patient reports her left delt continues to bother her with its weakness. Is tolerating return to work part time.    Pertinent History Patient returning to PT after C6-7 arthroplasty  with spur removal and discectomy on 12/06/19.  After one week started trying to quilt and had bad pain in her neck. Ever since surgery had pain in L side and shoulder with arm to forearm; can't sleep on L side right now. Is on a steroid taper now for reduction of pain. Was not given a protocol but was told no lifting more than 10lb, no cervical extension. Reports the surgeon wants to be conservative with treatment at the beginning. PMH of hyperglycemia, elevated BP, arthritis, HLD, restless legs, chronic insomnia, prior vertiginous migraine, thalamic CVA, anxiety, CAD, depression, GERD, hx of cancer, IBS, lumbar disc disease (prior back surgery with fusion x2 L4-5, S1), and cervical disc disease. 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    Limitations Sitting;Lifting;Standing;House hold activities;Reading    How long can you sit comfortably? neck pain limits sitting position    How long can you stand comfortably? n/a    How long can you walk comfortably? 2 miles    Patient Stated Goals decrease pain, improve ROM    Currently in Pain? Yes    Pain Score 2     Pain Location Neck    Pain Orientation Right;Left;Lower  Pain Descriptors / Indicators Aching    Pain Type Chronic pain;Surgical pain    Pain Onset More than a month ago          3/10 was a 6/10 Tuesday night.     supine: use of heat pad under upper thoracic lower cervical region Cervical sidebend with gentle overpressure 3x30 second holds each direction  cerivcal rotation with gentle overpressure 3x 30 second holds each direction Suboccipital release 3x30 second holds  Cervical AROM rotation 3x30 seconds Cervical AROM sidebending 3x30 seconds Cervical AROM extension 10x 3 second hold  First rib mobilization 3x 20 seconds grade II mobilizations each side Median nerve glides 15x each UE (more limited on the L) chin tucks 15x 3 second holds with scapular retraction        Prone:  Grade II-III mobilizations to thoracic spine with reduced hypomobility with repetition 4x 10 seconds each level J mobilization to upper thoracic/lower cervical for reduced thoracic kyphosis. 3x30 seconds  Inferio-medial rib glides L grade II-III 6x 10 seconds in varying planes of motion for reduction of tension and improve posture; hypomobile and tender initially in rib 6 and 7.    sidelying  LUE Abduction : painful terminated with full length arm span, modified to elbow flexion improved pain control, only able to perform to 45 degrees pain free 10x ER: with stabilization 15x.     Pt educated throughout session about proper posture and technique with exercises. Improved exercise technique, movement at target joints, use of target muscles after min to mod verbal, visual, tactile cues.        noticeable middle delt weakness LUE                      PT Education - 02/21/20 0802    Education Details exercise technique, abduction strengthening interventions, posture    Person(s) Educated Patient    Methods Explanation;Demonstration;Tactile cues;Verbal cues    Comprehension Verbalized understanding;Returned demonstration;Verbal cues required;Tactile cues required            PT Short Term Goals - 01/08/20 1809      PT SHORT TERM GOAL #1   Title Patient will be independent in home exercise program to improve strength/mobility/pain relief for better functional independence with ADLs.    Baseline 8/11; HEP given    Time 4    Period Weeks    Status New    Target Date 02/05/20             PT Long Term Goals - 01/08/20 1810      PT LONG TERM GOAL #1   Title Patient will report a worst pain of 3/10 on VAS in cervical spine to improve tolerance with ADLs and reduced symptoms with activities.    Baseline 8/11: 8/10    Time 8    Period Weeks    Status New    Target Date 03/04/20      PT LONG TERM GOAL #2   Title Patient will increase FOTO score to equal  to or greater than  50/100   to demonstrate statistically significant improvement in mobility and quality of life.    Baseline 8/11: 40%    Time 8    Period Weeks    Status New    Target Date 03/04/20      PT LONG TERM GOAL #3   Title Patient will reduce Neck Disability Index score to <10% to demonstrate minimal disability with ADL's including improved sleeping tolerance, sitting  tolerance, etc for better mobility at home and work.    Baseline 8/11: 62%    Time 8    Period Weeks    Status New    Target Date 03/04/20      PT LONG TERM GOAL #4   Title Patient will increase cervical AROM within 10 degrees of functional range for improved function, ability to drive, and return to PLOF.    Baseline 8/11: extension deferred rotation: R 28 L 40, SB R 10 L 4    Time 8    Period Weeks    Status New    Target Date 03/04/20                 Plan - 02/21/20 0803    Clinical Impression Statement Patient continues to have noticeable weakness in middle delt of LUE and pain with abduction movements in gravity assisted and reduced positions. Modifications allow for progressive strengthening in pain free range. Patient would benefit from skilled physical therapy to reduce pain, improve muscle tissue length and posture, and return patient to PLOF    Personal Factors and Comorbidities Age;Comorbidity 3+;Past/Current Experience;Profession;Social Background;Time since onset of injury/illness/exacerbation    Comorbidities hyperglycemia, elevated BP, arthritis, HLD, restless legs, chronic insomnia, prior vertiginous migraine, thalamic CVA, anxiety, CAD, depression, GERD, hx of cancer, IBS, lumbar disc disease (prior back surgery with fusion x2 L4-5, S1), and cervical disc disease.    Examination-Activity Limitations Bed Mobility;Bend;Caring for Others;Carry;Dressing;Sleep;Reach Overhead;Lift;Hygiene/Grooming;Stairs;Bathing;Sit    Examination-Participation Restrictions Cleaning;Community  Activity;Driving;Interpersonal Relationship;Laundry;Volunteer;Meal Prep;Yard Work;Other    Stability/Clinical Decision Making Evolving/Moderate complexity    Rehab Potential Fair    PT Frequency 2x / week    PT Duration 8 weeks    PT Treatment/Interventions ADLs/Self Care Home Management;Biofeedback;Cryotherapy;Electrical Stimulation;Iontophoresis 4mg /ml Dexamethasone;Moist Heat;Traction;Ultrasound;DME Instruction;Functional mobility training;Therapeutic activities;Patient/family education;Neuromuscular re-education;Therapeutic exercise;Manual techniques;Energy conservation;Dry needling;Passive range of motion;Taping;Vestibular;Vasopneumatic Device;Visual/perceptual remediation/compensation;Canalith Repostioning;Balance training    PT Next Visit Plan nerve glides, STM, posture    PT Home Exercise Plan median and ulnar modified nerve glides    Consulted and Agree with Plan of Care Patient           Patient will benefit from skilled therapeutic intervention in order to improve the following deficits and impairments:  Decreased activity tolerance, Decreased mobility, Decreased range of motion, Decreased strength, Hypomobility, Impaired flexibility, Impaired perceived functional ability, Increased muscle spasms, Postural dysfunction, Improper body mechanics, Pain, Decreased knowledge of precautions, Decreased scar mobility  Visit Diagnosis: Cervicalgia  Abnormal posture  Radiculopathy, cervical region     Problem List Patient Active Problem List   Diagnosis Date Noted  . PFO (patent foramen ovale)   . CAD (coronary artery disease), native coronary artery   . Acute CVA (cerebrovascular accident) ([place]) 06/15/2018  . Depression 10/16/2017  . Migraine 10/16/2017  . Hyperglycemia, unspecified 07/10/2017  . Hyperglycemia 07/10/2017  . H/O dizziness 08/02/2016  . Cervical disc disorder with radiculopathy of cervical region 04/12/2016  . Nonallopathic lesion of thoracic region 04/12/2016    . Nonallopathic lesion of rib cage 04/12/2016  . Facet arthritis of cervical region 03/21/2016  . Hyperlipidemia 01/26/2015  . Labral tear of shoulder, right, subsequent encounter 12/04/2014  . 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 Impingement syndrome of right shoulder 05/28/2014  . Right supraspinatus tenosynovitis 05/28/2014  . Arthritis pain 04/03/2014  . Fatigue 04/03/2014  . 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 IBS (irritable bowel syndrome) 06/20/2012  . Lumbar disc disease 06/20/2012   Janna Arch, PT, DPT   02/21/2020, 8:04  [place] MAIN [facility] SERVICES [address] [place], [place], [address] Phone: [phone]   Fax:  [phone]  Name: Lori Morales MRN: [id number] Date of Birth: [birth year]

## 2020-02-25 ENCOUNTER — Ambulatory Visit: Payer: 59

## 2020-02-25 DIAGNOSIS — I2510 Atherosclerotic heart disease of native coronary artery without angina pectoris: Secondary | ICD-10-CM | POA: Diagnosis not present

## 2020-02-25 DIAGNOSIS — M519 Unspecified thoracic, thoracolumbar and lumbosacral intervertebral disc disorder: Secondary | ICD-10-CM | POA: Diagnosis not present

## 2020-02-25 DIAGNOSIS — M79672 Pain in left foot: Secondary | ICD-10-CM | POA: Diagnosis not present

## 2020-02-25 DIAGNOSIS — F3289 Other specified depressive episodes: Secondary | ICD-10-CM | POA: Diagnosis not present

## 2020-02-25 DIAGNOSIS — I10 Essential (primary) hypertension: Secondary | ICD-10-CM | POA: Diagnosis not present

## 2020-02-27 ENCOUNTER — Ambulatory Visit: Payer: 59

## 2020-02-27 ENCOUNTER — Other Ambulatory Visit: Payer: Self-pay

## 2020-02-27 DIAGNOSIS — R293 Abnormal posture: Secondary | ICD-10-CM | POA: Diagnosis not present

## 2020-02-27 DIAGNOSIS — M542 Cervicalgia: Secondary | ICD-10-CM | POA: Diagnosis not present

## 2020-02-27 DIAGNOSIS — M5412 Radiculopathy, cervical region: Secondary | ICD-10-CM

## 2020-02-27 DIAGNOSIS — M6281 Muscle weakness (generalized): Secondary | ICD-10-CM | POA: Diagnosis not present

## 2020-02-27 NOTE — Therapy (Signed)
[place] MAIN [facility] SERVICES [address] [place], [place], [address] Phone: [phone]   Fax:  [phone]  Physical Therapy Treatment  Patient Details  Name: Lori Morales MRN: [id number] Date of Birth: June 19, 1960 Referring Provider (PT): Dr. Earleen Newport    Encounter Date: 02/27/2020   PT End of Session - 02/27/20 1209    Visit Number 9    Number of Visits 16    Date for PT Re-Evaluation 03/04/20    Authorization Type 9/10 eval 01/08/20    PT Start Time 0846    PT Stop Time 0929    PT Time Calculation (min) 43 min    Activity Tolerance Patient tolerated treatment well   lack of protocol   Behavior During Therapy [facility] for tasks assessed/performed           Past Medical History:  Diagnosis Date   Arthritis    CAD (coronary artery disease), native coronary artery    mild non calcified plaque in mid LAD and prox LCx on coronary CTA 01/2019   Depression    GERD (gastroesophageal reflux disease)    Headache    migraines   Hx of squamous cell carcinoma 07/11/2016   Right distal dorsum lat forearm    Hypertension    IBS (irritable bowel syndrome)    Palpitations    PFO (patent foramen ovale)    small by cardiac CTA 01/2019   PONV (postoperative nausea and vomiting)    Restless legs syndrome     Past Surgical History:  Procedure Laterality Date   ABDOMINOPLASTY     BACK SURGERY     1 laminectomy, 1 fusion   COLONOSCOPY     COLONOSCOPY WITH PROPOFOL N/A 01/17/2017   Procedure: COLONOSCOPY WITH PROPOFOL;  Surgeon: Lollie Sails, MD;  Location: [facility] ENDOSCOPY;  Service: Endoscopy;  Laterality: N/A;   WISDOM TOOTH EXTRACTION      There were no vitals filed for this visit.   Subjective Assessment - 02/27/20 1205    Subjective Patient presents with increased tension reported and increased radiation of symptoms down L arm due to increase demands and stress at work.    Pertinent History Patient returning to  PT after C6-7 arthroplasty with spur removal and discectomy on 12/06/19.  After one week started trying to quilt and had bad pain in her neck. Ever since surgery had pain in L side and shoulder with arm to forearm; can't sleep on L side right now. Is on a steroid taper now for reduction of pain. Was not given a protocol but was told no lifting more than 10lb, no cervical extension. Reports the surgeon wants to be conservative with treatment at the beginning. PMH of hyperglycemia, elevated BP, arthritis, HLD, restless legs, chronic insomnia, prior vertiginous migraine, thalamic CVA, anxiety, CAD, depression, GERD, hx of cancer, IBS, lumbar disc disease (prior back surgery with fusion x2 L4-5, S1), and cervical disc disease. 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    Limitations Sitting;Lifting;Standing;House hold activities;Reading    How long can you sit comfortably? neck pain limits sitting position    How long can you stand comfortably? n/a    How long can you walk comfortably? 2 miles    Patient Stated Goals decrease pain, improve ROM    Currently in Pain? Yes    Pain Score 2     Pain Location Neck  Pain Orientation Left    Pain Descriptors / Indicators Aching;Squeezing    Pain Type Chronic pain;Neuropathic pain    Pain Onset More than a month ago    Pain Frequency Intermittent                  supine: use of heat pad under upper thoracic lower cervical region Cervical sidebend with gentle overpressure 3x30 second holds each direction  cervical rotation with gentle overpressure 3x 30 second holds each direction Suboccipital release 3x30 second holds  Cervical AROM rotation 3x30 seconds Cervical AROM side bending 3x30 seconds Cervical AROM extension 10x 3 second hold  First rib mobilization 3x 20 seconds grade II mobilizations each side Median nerve glides 15x each UE (more limited on the L) chin tucks 15x 3 second holds  with scapular retraction   abduction isometric for L mid delt activation 12x  3 second holds Pain in SCM reduced with deep tissue pressure and release x 3 minutes    Prone:  Grade II-III mobilizations to thoracic spine with reduced hypomobility with repetition 4x 10 seconds each level J mobilization to upper thoracic/lower cervical for reduced thoracic kyphosis. 3x30 seconds  Inferio-medial rib glides L grade II-III 6x 10 seconds in varying planes of motion for reduction of tension and improve posture; hypomobile and tender initially in rib 6 and 7.    sidelying  LUE Abduction : painful terminated with full length arm span, modified to elbow flexion improved pain control, only able to perform to 45 degrees pain free 10x ER: with stabilization 15x.    Seated: STM with metal tool L upper trap and levator scap x 4 minutes    Pt educated throughout session about proper posture and technique with exercises. Improved exercise technique, movement at target joints, use of target muscles after min to mod verbal, visual, tactile cues.                           PT Education - 02/27/20 1208    Education Details exercise technique, manual, posture    Person(s) Educated Patient    Methods Explanation;Demonstration;Tactile cues;Verbal cues    Comprehension Verbalized understanding;Returned demonstration;Verbal cues required;Tactile cues required            PT Short Term Goals - 01/08/20 1809      PT SHORT TERM GOAL #1   Title Patient will be independent in home exercise program to improve strength/mobility/pain relief for better functional independence with ADLs.    Baseline 8/11; HEP given    Time 4    Period Weeks    Status New    Target Date 02/05/20             PT Long Term Goals - 01/08/20 1810      PT LONG TERM GOAL #1   Title Patient will report a worst pain of 3/10 on VAS in cervical spine to improve tolerance with ADLs and reduced symptoms with activities.     Baseline 8/11: 8/10    Time 8    Period Weeks    Status New    Target Date 03/04/20      PT LONG TERM GOAL #2   Title Patient will increase FOTO score to equal to or greater than  50/100   to demonstrate statistically significant improvement in mobility and quality of life.    Baseline 8/11: 40%    Time 8    Period Weeks  Status New    Target Date 03/04/20      PT LONG TERM GOAL #3   Title Patient will reduce Neck Disability Index score to <10% to demonstrate minimal disability with ADLs including improved sleeping tolerance, sitting tolerance, etc for better mobility at home and work.    Baseline 8/11: 62%    Time 8    Period Weeks    Status New    Target Date 03/04/20      PT LONG TERM GOAL #4   Title Patient will increase cervical AROM within 10 degrees of functional range for improved function, ability to drive, and return to PLOF.    Baseline 8/11: extension deferred rotation: R 28 L 40, SB R 10 L 4    Time 8    Period Weeks    Status New    Target Date 03/04/20                 Plan - 02/27/20 1216    Clinical Impression Statement Patient presents with increased pain, guarding of L shoulder/neck, and tension with additional increased stress at work. Patient requires additional soft tissue work with metal tool this session to reduce muscle tissue tension. Patient would benefit from skilled physical therapy to reduce pain, improve muscle tissue length and posture, and return patient to PLOF    Personal Factors and Comorbidities Age;Comorbidity 3+;Past/Current Experience;Profession;Social Background;Time since onset of injury/illness/exacerbation    Comorbidities hyperglycemia, elevated BP, arthritis, HLD, restless legs, chronic insomnia, prior vertiginous migraine, thalamic CVA, anxiety, CAD, depression, GERD, hx of cancer, IBS, lumbar disc disease (prior back surgery with fusion x2 L4-5, S1), and cervical disc disease.    Examination-Activity Limitations Bed  Mobility;Bend;Caring for Others;Carry;Dressing;Sleep;Reach Overhead;Lift;Hygiene/Grooming;Stairs;Bathing;Sit    Examination-Participation Restrictions Cleaning;Community Activity;Driving;Interpersonal Relationship;Laundry;Volunteer;Meal Prep;Yard Work;Other    Stability/Clinical Decision Making Evolving/Moderate complexity    Rehab Potential Fair    PT Frequency 2x / week    PT Duration 8 weeks    PT Treatment/Interventions ADLs/Self Care Home Management;Biofeedback;Cryotherapy;Electrical Stimulation;Iontophoresis 4mg /ml Dexamethasone;Moist Heat;Traction;Ultrasound;DME Instruction;Functional mobility training;Therapeutic activities;Patient/family education;Neuromuscular re-education;Therapeutic exercise;Manual techniques;Energy conservation;Dry needling;Passive range of motion;Taping;Vestibular;Vasopneumatic Device;Visual/perceptual remediation/compensation;Canalith Repostioning;Balance training    PT Next Visit Plan nerve glides, STM, posture    PT Home Exercise Plan median and ulnar modified nerve glides    Consulted and Agree with Plan of Care Patient           Patient will benefit from skilled therapeutic intervention in order to improve the following deficits and impairments:  Decreased activity tolerance, Decreased mobility, Decreased range of motion, Decreased strength, Hypomobility, Impaired flexibility, Impaired perceived functional ability, Increased muscle spasms, Postural dysfunction, Improper body mechanics, Pain, Decreased knowledge of precautions, Decreased scar mobility  Visit Diagnosis: Cervicalgia  Abnormal posture  Radiculopathy, cervical region     Problem List Patient Active Problem List   Diagnosis Date Noted   PFO (patent foramen ovale)    CAD (coronary artery disease), native coronary artery    Acute CVA (cerebrovascular accident) ([place]) 06/15/2018   Depression 10/16/2017   Migraine 10/16/2017   Hyperglycemia, unspecified 07/10/2017   Hyperglycemia  07/10/2017   H/O dizziness 08/02/2016   Cervical disc disorder with radiculopathy of cervical region 04/12/2016   Nonallopathic lesion of thoracic region 04/12/2016   Nonallopathic lesion of rib cage 04/12/2016   Facet arthritis of cervical region 03/21/2016   Hyperlipidemia 01/26/2015   Labral tear of shoulder, right, subsequent encounter 12/04/2014   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

## 2020-03-02 DIAGNOSIS — M7732 Calcaneal spur, left foot: Secondary | ICD-10-CM | POA: Diagnosis not present

## 2020-03-02 DIAGNOSIS — M722 Plantar fascial fibromatosis: Secondary | ICD-10-CM | POA: Diagnosis not present

## 2020-03-03 ENCOUNTER — Other Ambulatory Visit: Payer: Self-pay

## 2020-03-03 ENCOUNTER — Other Ambulatory Visit: Payer: Self-pay | Admitting: Internal Medicine

## 2020-03-03 ENCOUNTER — Ambulatory Visit: Payer: 59 | Attending: Internal Medicine

## 2020-03-03 DIAGNOSIS — R293 Abnormal posture: Secondary | ICD-10-CM | POA: Diagnosis not present

## 2020-03-03 DIAGNOSIS — M542 Cervicalgia: Secondary | ICD-10-CM | POA: Insufficient documentation

## 2020-03-03 DIAGNOSIS — M5412 Radiculopathy, cervical region: Secondary | ICD-10-CM | POA: Insufficient documentation

## 2020-03-03 DIAGNOSIS — M6281 Muscle weakness (generalized): Secondary | ICD-10-CM | POA: Insufficient documentation

## 2020-03-03 NOTE — Therapy (Signed)
[place] MAIN [facility] SERVICES [address] [place], [place], [address] Phone: [phone]   Fax:  [phone]  Physical Therapy Treatment/RECERT/ Physical Therapy Progress Note   Dates of reporting period  01/08/20  to   03/03/20  Patient Details  Name: Lori Morales MRN: [id number] Date of Birth: 02/04/1961 Referring Provider (PT): Dr. Earleen Newport    Encounter Date: 03/03/2020   PT End of Session - 03/03/20 1037    Visit Number 10    Number of Visits 26    Date for PT Re-Evaluation 04/28/20    Authorization Type 10/10 eval 01/08/20; next session 1/10 PN 03/03/20    PT Start Time 0846    PT Stop Time 0929    PT Time Calculation (min) 43 min    Activity Tolerance Patient tolerated treatment well   lack of protocol   Behavior During Therapy [facility] for tasks assessed/performed           Past Medical History:  Diagnosis Date  . Arthritis   . CAD (coronary artery disease), native coronary artery    mild non calcified plaque in mid LAD and prox LCx on coronary CTA 01/2019  . Depression   . GERD (gastroesophageal reflux disease)   . Headache    migraines  . Hx of squamous cell carcinoma 07/11/2016   Right distal dorsum lat forearm   . Hypertension   . IBS (irritable bowel syndrome)   . Palpitations   . PFO (patent foramen ovale)    small by cardiac CTA 01/2019  . PONV (postoperative nausea and vomiting)   . Restless legs syndrome     Past Surgical History:  Procedure Laterality Date  . ABDOMINOPLASTY    . BACK SURGERY     1 laminectomy, 1 fusion  . COLONOSCOPY    . COLONOSCOPY WITH PROPOFOL N/A 01/17/2017   Procedure: COLONOSCOPY WITH PROPOFOL;  Surgeon: Lollie Sails, MD;  Location: [facility] ENDOSCOPY;  Service: Endoscopy;  Laterality: N/A;  . WISDOM TOOTH EXTRACTION      There were no vitals filed for this visit.   Subjective Assessment - 03/03/20 0852    Subjective Patient reports she has started using heat and massage  between sessions and it has assisted. Feels 75% better    Pertinent History Patient returning to PT after C6-7 arthroplasty with spur removal and discectomy on 12/06/19.  After one week started trying to quilt and had bad pain in her neck. Ever since surgery had pain in L side and shoulder with arm to forearm; can't sleep on L side right now. Is on a steroid taper now for reduction of pain. Was not given a protocol but was told no lifting more than 10lb, no cervical extension. Reports the surgeon wants to be conservative with treatment at the beginning. PMH of hyperglycemia, elevated BP, arthritis, HLD, restless legs, chronic insomnia, prior vertiginous migraine, thalamic CVA, anxiety, CAD, depression, GERD, hx of cancer, IBS, lumbar disc disease (prior back surgery with fusion x2 L4-5, S1), and cervical disc disease. 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    Limitations Sitting;Lifting;Standing;House hold activities;Reading    How long can you sit comfortably? neck pain limits sitting position    How long can you stand comfortably? n/a    How long can you walk comfortably? 2 miles    Patient Stated Goals decrease pain, improve ROM    Currently in  Pain? Yes    Pain Score 2     Pain Location Neck    Pain Orientation Left    Pain Descriptors / Indicators Aching    Pain Onset More than a month ago                   Goals:  VAS: worst 5/10 average 2-3/10 FOTO 53 NDI: 38%  Cervical AROM     Right Left  Flexion 18  Extension 7  Side Bending    Rotation 53 60       supine: use of heat pad under upper thoracic lower cervical region Cervical sidebend with gentle overpressure 3x30 second holds each direction  cervical rotation with gentle overpressure 3x 30 second holds each direction Suboccipital release 3x30 second holds Cervical AROM extension 10x 3 second hold; use of rolled up towel under neck for pain reduction   First rib mobilization3x 20 seconds grade II mobilizations each side Median nerve glides 15x each UE (more limited on the L) chin tucks 15x 3 second holdswith scapular retraction abduction isometric for L mid delt activation 12x  3 second holds  Prone: Grade II-III mobilizations to thoracic spine with reduced hypomobility with repetition4x 10 seconds each level J mobilization to upper thoracic/lower cervical for reduced thoracic kyphosis. 3x30 seconds Inferio-medial rib glides L grade II-III 6x 10 seconds in varying planes of motion for reduction of tension and improve posture; hypomobile and tender initially in rib 6 and 7. STM to upper trap, levator scap, cervical paraspinals x4 minutes with implementation of effleurage and ptrissage for muscle tissue tension reduction.     Pt educated throughout session about proper posture and technique with exercises. Improved exercise technique, movement at target joints, use of target muscles after min to mod verbal, visual, tactile cues.        Patient's condition has the potential to improve in response to therapy. Maximum improvement is yet to be obtained. The anticipated improvement is attainable and reasonable in a generally predictable time.  Patient reports she feels she is 70-75% better.               PT Education - 03/03/20 1033    Education Details goals, POC exercise technique,    Person(s) Educated Patient    Methods Explanation;Demonstration;Tactile cues;Verbal cues    Comprehension Verbalized understanding;Returned demonstration;Verbal cues required;Tactile cues required            PT Short Term Goals - 03/03/20 1416      PT SHORT TERM GOAL #1   Title Patient will be independent in home exercise program to improve strength/mobility/pain relief for better functional independence with ADLs.    Baseline 8/11; HEP given 10/5: HEP compliant    Time 4    Period Weeks    Status Achieved    Target Date  02/05/20             PT Long Term Goals - 03/03/20 1417      PT LONG TERM GOAL #1   Title Patient will report a worst pain of 3/10 on VAS in cervical spine to improve tolerance with ADLs and reduced symptoms with activities.    Baseline 8/11: 8/10 10/5: 5/10    Time 8    Period Weeks    Status Partially Met    Target Date 04/28/20      PT LONG TERM GOAL #2   Title Patient will increase FOTO score to equal to or greater than  50/100  to demonstrate statistically significant improvement in mobility and quality of life.    Baseline 8/11: 40% 10/5: 10/5: 53%    Time 8    Period Weeks    Status Achieved      PT LONG TERM GOAL #3   Title Patient will reduce Neck Disability Index score to <10% to demonstrate minimal disability with ADL's including improved sleeping tolerance, sitting tolerance, etc for better mobility at home and work.    Baseline 8/11: 62% 10/5: 38%    Time 8    Period Weeks    Status Partially Met    Target Date 04/28/20      PT LONG TERM GOAL #4   Title Patient will increase cervical AROM within 10 degrees of functional range for improved function, ability to drive, and return to PLOF.    Baseline 8/11: extension deferred rotation: R 28 L 40, SB R 10 L 4 10/5: see note    Time 8    Period Weeks    Status Partially Met    Target Date 04/28/20                 Plan - 03/03/20 1423    Clinical Impression Statement Patient is improving in pain levels with worst pain now being only a 5/10 with pain primarily ranging in the 2-3 range. Her FOTO has improved as well as the NDI. Cervical ROM continues to be limited but is improving with functional ranges. A continued need for extension based interventions, posture, and strengthening for reduction of innervation based symptoms. Patient's condition has the potential to improve in response to therapy. Maximum improvement is yet to be obtained. The anticipated improvement is attainable and reasonable in a generally  predictable time.Patient would benefit from skilled physical therapy to reduce pain, improve muscle tissue length and posture, and return patient to PLOF    Personal Factors and Comorbidities Age;Comorbidity 3+;Past/Current Experience;Profession;Social Background;Time since onset of injury/illness/exacerbation    Comorbidities hyperglycemia, elevated BP, arthritis, HLD, restless legs, chronic insomnia, prior vertiginous migraine, thalamic CVA, anxiety, CAD, depression, GERD, hx of cancer, IBS, lumbar disc disease (prior back surgery with fusion x2 L4-5, S1), and cervical disc disease.    Examination-Activity Limitations Bed Mobility;Bend;Caring for Others;Carry;Dressing;Sleep;Reach Overhead;Lift;Hygiene/Grooming;Stairs;Bathing;Sit    Examination-Participation Restrictions Cleaning;Community Activity;Driving;Interpersonal Relationship;Laundry;Volunteer;Meal Prep;Yard Work;Other    Stability/Clinical Decision Making Evolving/Moderate complexity    Rehab Potential Fair    PT Frequency 2x / week    PT Duration 8 weeks    PT Treatment/Interventions ADLs/Self Care Home Management;Biofeedback;Cryotherapy;Electrical Stimulation;Iontophoresis 4mg /ml Dexamethasone;Moist Heat;Traction;Ultrasound;DME Instruction;Functional mobility training;Therapeutic activities;Patient/family education;Neuromuscular re-education;Therapeutic exercise;Manual techniques;Energy conservation;Dry needling;Passive range of motion;Taping;Vestibular;Vasopneumatic Device;Visual/perceptual remediation/compensation;Canalith Repostioning;Balance training    PT Next Visit Plan nerve glides, STM, posture    PT Home Exercise Plan median and ulnar modified nerve glides    Consulted and Agree with Plan of Care Patient           Patient will benefit from skilled therapeutic intervention in order to improve the following deficits and impairments:  Decreased activity tolerance, Decreased mobility, Decreased range of motion, Decreased strength,  Hypomobility, Impaired flexibility, Impaired perceived functional ability, Increased muscle spasms, Postural dysfunction, Improper body mechanics, Pain, Decreased knowledge of precautions, Decreased scar mobility  Visit Diagnosis: Cervicalgia  Abnormal posture  Radiculopathy, cervical region  Muscle weakness (generalized)     Problem List Patient Active Problem List   Diagnosis Date Noted  . PFO (patent foramen ovale)   . CAD (coronary artery disease), native coronary artery   .  Acute CVA (cerebrovascular accident) ([place]) 06/15/2018  . Depression 10/16/2017  . Migraine 10/16/2017  . Hyperglycemia, unspecified 07/10/2017  . Hyperglycemia 07/10/2017  . H/O dizziness 08/02/2016  . Cervical disc disorder with radiculopathy of cervical region 04/12/2016  . Nonallopathic lesion of thoracic region 04/12/2016  . Nonallopathic lesion of rib cage 04/12/2016  . Facet arthritis of cervical region 03/21/2016  . Hyperlipidemia 01/26/2015  . Labral tear of shoulder, right, subsequent encounter 12/04/2014  . 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 Impingement syndrome of right shoulder 05/28/2014  . Right supraspinatus tenosynovitis 05/28/2014  . Arthritis pain 04/03/2014  . Fatigue 04/03/2014  . 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 IBS (irritable bowel syndrome) 06/20/2012  . Lumbar disc disease 06/20/2012   Janna Arch, PT, DPT   03/03/2020, 2:23 PM  [place] MAIN [facility] SERVICES [address] [place], [place], [address] Phone: [phone]   Fax:  [phone]  Name: MAHSA HANSER MRN: [id number] Date of Birth: 05-25-1961

## 2020-03-05 ENCOUNTER — Other Ambulatory Visit: Payer: Self-pay

## 2020-03-05 ENCOUNTER — Ambulatory Visit: Payer: 59

## 2020-03-05 DIAGNOSIS — M5412 Radiculopathy, cervical region: Secondary | ICD-10-CM | POA: Diagnosis not present

## 2020-03-05 DIAGNOSIS — R293 Abnormal posture: Secondary | ICD-10-CM | POA: Diagnosis not present

## 2020-03-05 DIAGNOSIS — M542 Cervicalgia: Secondary | ICD-10-CM | POA: Diagnosis not present

## 2020-03-05 DIAGNOSIS — M6281 Muscle weakness (generalized): Secondary | ICD-10-CM

## 2020-03-05 NOTE — Therapy (Signed)
[place] MAIN [facility] SERVICES [address] [place], [place], [address] Phone: [phone]   Fax:  [phone]  Physical Therapy Treatment  Patient Details  Name: Lori Morales MRN: [id number] Date of Birth: [birth year] Referring Provider (PT): Dr. Earleen Newport    Encounter Date: 03/05/2020   PT End of Session - 03/05/20 1115    Visit Number 11    Number of Visits 26    Date for PT Re-Evaluation 04/28/20    Authorization Type 1/10 PN 03/03/20    PT Start Time 0845    PT Stop Time 0929    PT Time Calculation (min) 44 min    Activity Tolerance Patient tolerated treatment well   lack of protocol   Behavior During Therapy [facility] for tasks assessed/performed           Past Medical History:  Diagnosis Date  . Arthritis   . CAD (coronary artery disease), native coronary artery    mild non calcified plaque in mid LAD and prox LCx on coronary CTA 01/2019  . Depression   . GERD (gastroesophageal reflux disease)   . Headache    migraines  . Hx of squamous cell carcinoma 07/11/2016   Right distal dorsum lat forearm   . Hypertension   . IBS (irritable bowel syndrome)   . Palpitations   . PFO (patent foramen ovale)    small by cardiac CTA 01/2019  . PONV (postoperative nausea and vomiting)   . Restless legs syndrome     Past Surgical History:  Procedure Laterality Date  . ABDOMINOPLASTY    . BACK SURGERY     1 laminectomy, 1 fusion  . COLONOSCOPY    . COLONOSCOPY WITH PROPOFOL N/A 01/17/2017   Procedure: COLONOSCOPY WITH PROPOFOL;  Surgeon: Lollie Sails, MD;  Location[facility] ENDOSCOPY;  Service: Endoscopy;  Laterality: N/A;  . WISDOM TOOTH EXTRACTION      There were no vitals filed for this visit.   Subjective Assessment - 03/05/20 1111    Subjective Patient reports incrased pain/radiating symptoms down L arm. Was feeling better yesterday until after work when increased challenges resulted in increased tension.    Pertinent  History Patient returning to PT after C6-7 arthroplasty with spur removal and discectomy on 12/06/19.  After one week started trying to quilt and had bad pain in her neck. Ever since surgery had pain in L side and shoulder with arm to forearm; can't sleep on L side right now. Is on a steroid taper now for reduction of pain. Was not given a protocol but was told no lifting more than 10lb, no cervical extension. Reports the surgeon wants to be conservative with treatment at the beginning. PMH of hyperglycemia, elevated BP, arthritis, HLD, restless legs, chronic insomnia, prior vertiginous migraine, thalamic CVA, anxiety, CAD, depression, GERD, hx of cancer, IBS, lumbar disc disease (prior back surgery with fusion x2 L4-5, S1), and cervical disc disease. 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    Limitations Sitting;Lifting;Standing;House hold activities;Reading    How long can you sit comfortably? neck pain limits sitting position    How long can you stand comfortably? n/a    How long can you walk comfortably? 2 miles    Patient Stated Goals decrease pain, improve ROM    Currently in Pain? Yes    Pain Score 3     Pain Location Neck  Pain Orientation Left    Pain Descriptors / Indicators Radiating    Pain Type Chronic pain;Neuropathic pain    Pain Onset More than a month ago               supine: use of heat pad under upper thoracic lower cervical region Cervical sidebend with gentle overpressure 3x30 second holds each direction  cervical rotation with gentle overpressure 3x 30 second holds each direction Suboccipital release 3x30 second holds  Cervical AROM extension 10x 3 second hold ; use of rolled up towel under neck for pain reduction  First rib mobilization 3x 20 seconds grade II mobilizations each side Median nerve glides 15x each UE (more limited on the L), ulnar nerve glide 15x each UE  chin tucks 15x 3 second holds with  scapular retraction   abduction isometric for L mid delt activation 12x  3 second holds   Prone:  Grade II-III mobilizations to thoracic spine with reduced hypomobility with repetition 4x 10 seconds each level J mobilization to upper thoracic/lower cervical for reduced thoracic kyphosis. 3x30 seconds  Inferio-medial rib glides L grade II-III 6x 10 seconds in varying planes of motion for reduction of tension and improve posture; hypomobile and tender initially in rib 6 and 7.   STM to upper trap, levator scap, cervical paraspinals x7 minutes with implementation of effleurage and ptrissage for muscle tissue tension reduction.       sidelying  LUE Abduction : palm down elbow extended 10x to 80 degree range for middle delt activation ER: with stabilization 15x with 2lb dumbell   Pt educated throughout session about proper posture and technique with exercises. Improved exercise technique, movement at target joints, use of target muscles after min to mod verbal, visual, tactile cues.                            PT Education - 03/05/20 1114    Education Details exercise technique, body mechanics, manual drinking water after soft tissue release    Person(s) Educated Patient    Methods Explanation;Demonstration;Tactile cues    Comprehension Verbalized understanding;Returned demonstration;Verbal cues required;Tactile cues required            PT Short Term Goals - 03/03/20 1416      PT SHORT TERM GOAL #1   Title Patient will be independent in home exercise program to improve strength/mobility/pain relief for better functional independence with ADLs.    Baseline 8/11; HEP given 10/5: HEP compliant    Time 4    Period Weeks    Status Achieved    Target Date 02/05/20             PT Long Term Goals - 03/03/20 1417      PT LONG TERM GOAL #1   Title Patient will report a worst pain of 3/10 on VAS in cervical spine to improve tolerance with ADLs and reduced symptoms  with activities.    Baseline 8/11: 8/10 10/5: 5/10    Time 8    Period Weeks    Status Partially Met    Target Date 04/28/20      PT LONG TERM GOAL #2   Title Patient will increase FOTO score to equal to or greater than  50/100   to demonstrate statistically significant improvement in mobility and quality of life.    Baseline 8/11: 40% 10/5: 10/5: 53%    Time 8    Period Weeks  Status Achieved      PT LONG TERM GOAL #3   Title Patient will reduce Neck Disability Index score to <10% to demonstrate minimal disability with ADL's including improved sleeping tolerance, sitting tolerance, etc for better mobility at home and work.    Baseline 8/11: 62% 10/5: 38%    Time 8    Period Weeks    Status Partially Met    Target Date 04/28/20      PT LONG TERM GOAL #4   Title Patient will increase cervical AROM within 10 degrees of functional range for improved function, ability to drive, and return to PLOF.    Baseline 8/11: extension deferred rotation: R 28 L 40, SB R 10 L 4 10/5: see note    Time 8    Period Weeks    Status Partially Met    Target Date 04/28/20                 Plan - 03/05/20 1118    Clinical Impression Statement Patient presents to physical therapy with increased radiation of symptoms and stiffness of L arm from neck that improved with manual and therex. Patient had multiple trigger points on upper L trap that upon release resulted in decreased nerve radiating pain. Patient would benefit from skilled physical therapy to reduce pain, improve muscle tissue length and posture, and return patient to PLOF    Personal Factors and Comorbidities Age;Comorbidity 3+;Past/Current Experience;Profession;Social Background;Time since onset of injury/illness/exacerbation    Comorbidities hyperglycemia, elevated BP, arthritis, HLD, restless legs, chronic insomnia, prior vertiginous migraine, thalamic CVA, anxiety, CAD, depression, GERD, hx of cancer, IBS, lumbar disc disease (prior  back surgery with fusion x2 L4-5, S1), and cervical disc disease.    Examination-Activity Limitations Bed Mobility;Bend;Caring for Others;Carry;Dressing;Sleep;Reach Overhead;Lift;Hygiene/Grooming;Stairs;Bathing;Sit    Examination-Participation Restrictions Cleaning;Community Activity;Driving;Interpersonal Relationship;Laundry;Volunteer;Meal Prep;Yard Work;Other    Stability/Clinical Decision Making Evolving/Moderate complexity    Rehab Potential Fair    PT Frequency 2x / week    PT Duration 8 weeks    PT Treatment/Interventions ADLs/Self Care Home Management;Biofeedback;Cryotherapy;Electrical Stimulation;Iontophoresis 72m/ml Dexamethasone;Moist Heat;Traction;Ultrasound;DME Instruction;Functional mobility training;Therapeutic activities;Patient/family education;Neuromuscular re-education;Therapeutic exercise;Manual techniques;Energy conservation;Dry needling;Passive range of motion;Taping;Vestibular;Vasopneumatic Device;Visual/perceptual remediation/compensation;Canalith Repostioning;Balance training    PT Next Visit Plan nerve glides, STM, posture    PT Home Exercise Plan median and ulnar modified nerve glides    Consulted and Agree with Plan of Care Patient           Patient will benefit from skilled therapeutic intervention in order to improve the following deficits and impairments:  Decreased activity tolerance, Decreased mobility, Decreased range of motion, Decreased strength, Hypomobility, Impaired flexibility, Impaired perceived functional ability, Increased muscle spasms, Postural dysfunction, Improper body mechanics, Pain, Decreased knowledge of precautions, Decreased scar mobility  Visit Diagnosis: Cervicalgia  Abnormal posture  Radiculopathy, cervical region  Muscle weakness (generalized)     Problem List Patient Active Problem List   Diagnosis Date Noted  . PFO (patent foramen ovale)   . CAD (coronary artery disease), native coronary artery   . [place] 06/15/2018  . Depression 10/16/2017  . Migraine 10/16/2017  . Hyperglycemia, unspecified 07/10/2017  . Hyperglycemia 07/10/2017  . H/O dizziness 08/02/2016  . Cervical disc disorder with radiculopathy of cervical region 04/12/2016  . Nonallopathic lesion of thoracic region 04/12/2016  . Nonallopathic lesion of rib cage 04/12/2016  . Facet arthritis of cervical region 03/21/2016  . Hyperlipidemia 01/26/2015  . Labral tear of shoulder, right, subsequent encounter 12/04/2014  . 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 Impingement syndrome of right shoulder 05/28/2014  . Right supraspinatus tenosynovitis 05/28/2014  . Arthritis pain 04/03/2014  . Fatigue 04/03/2014  . 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 IBS (irritable bowel syndrome) 06/20/2012  . Lumbar disc disease 06/20/2012   Janna Arch, PT, DPT   03/05/2020, 11:19 AM  [place] MAIN [facility] SERVICES [address] [place], [place], [address] Phone: [phone]   Fax:  [phone]  Name: Lori Morales MRN: [id number] Date of Birth: January 18, 1961

## 2020-03-10 ENCOUNTER — Other Ambulatory Visit: Payer: Self-pay

## 2020-03-10 ENCOUNTER — Ambulatory Visit: Payer: 59

## 2020-03-10 DIAGNOSIS — M5412 Radiculopathy, cervical region: Secondary | ICD-10-CM

## 2020-03-10 DIAGNOSIS — R293 Abnormal posture: Secondary | ICD-10-CM

## 2020-03-10 DIAGNOSIS — M6281 Muscle weakness (generalized): Secondary | ICD-10-CM | POA: Diagnosis not present

## 2020-03-10 DIAGNOSIS — M542 Cervicalgia: Secondary | ICD-10-CM

## 2020-03-10 NOTE — Therapy (Signed)
[place] MAIN [facility] SERVICES [address] [place], [place], [address] Phone: [phone]   Fax:  [phone]  Physical Therapy Treatment  Patient Details  Name: Lori Morales MRN: [id number] Date of Birth: Oct 02, 1960 Referring Provider (PT): Dr. Earleen Newport    Encounter Date: 03/10/2020   PT End of Session - 03/10/20 0900    Visit Number 12    Number of Visits 26    Date for PT Re-Evaluation 04/28/20    Authorization Type 2/10 PN 03/03/20    PT Start Time 0845    PT Stop Time 0928    PT Time Calculation (min) 43 min    Activity Tolerance Patient tolerated treatment well   lack of protocol   Behavior During Therapy [facility] for tasks assessed/performed           Past Medical History:  Diagnosis Date  . Arthritis   . CAD (coronary artery disease), native coronary artery    mild non calcified plaque in mid LAD and prox LCx on coronary CTA 01/2019  . Depression   . GERD (gastroesophageal reflux disease)   . Headache    migraines  . Hx of squamous cell carcinoma 07/11/2016   Right distal dorsum lat forearm   . Hypertension   . IBS (irritable bowel syndrome)   . Palpitations   . PFO (patent foramen ovale)    small by cardiac CTA 01/2019  . PONV (postoperative nausea and vomiting)   . Restless legs syndrome     Past Surgical History:  Procedure Laterality Date  . ABDOMINOPLASTY    . BACK SURGERY     1 laminectomy, 1 fusion  . COLONOSCOPY    . COLONOSCOPY WITH PROPOFOL N/A 01/17/2017   Procedure: COLONOSCOPY WITH PROPOFOL;  Surgeon: Lollie Sails, MD;  Location: [facility] ENDOSCOPY;  Service: Endoscopy;  Laterality: N/A;  . WISDOM TOOTH EXTRACTION      There were no vitals filed for this visit.   Subjective Assessment - 03/10/20 0859    Subjective Patient was able to go [place] the [employer] game sun[place]. Has new onset of radiating pain down R arm, is getting better on L now.    Pertinent History Patient returning to PT after C6-7  arthroplasty with spur removal and discectomy on 12/06/19.  After one week started trying to quilt and had bad pain in her neck. Ever since surgery had pain in L side and shoulder with arm to forearm; can't sleep on L side right now. Is on a steroid taper now for reduction of pain. Was not given a protocol but was told no lifting more than 10lb, no cervical extension. Reports the surgeon wants to be conservative with treatment at the beginning. PMH of hyperglycemia, elevated BP, arthritis, HLD, restless legs, chronic insomnia, prior vertiginous migraine, thalamic CVA, anxiety, CAD, depression, GERD, hx of cancer, IBS, lumbar disc disease (prior back surgery with fusion x2 L4-5, S1), and cervical disc disease. 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    Limitations Sitting;Lifting;Standing;House hold activities;Reading    How long can you sit comfortably? neck pain limits sitting position    How long can you stand comfortably? n/a    How long can you walk comfortably? 2 miles    Patient Stated Goals decrease pain, improve ROM    Currently in Pain? Yes    Pain Score 2     Pain Location Neck  Pain Orientation Left;Right    Pain Descriptors / Indicators Aching;Radiating    Pain Type Chronic pain    Pain Onset More than a month ago    Pain Frequency Intermittent                supine: use of heat pad under upper thoracic lower cervical region Cervical sidebend with gentle overpressure 3x30 second holds each direction  cervical rotation with gentle overpressure 3x 30 second holds each direction Suboccipital release 3x30 second holds  Cervical AROM extension 10x 3 second hold ; use of rolled up towel under neck for pain reduction  Cervical AROM alternating L and R rotation 10x  First rib mobilization 3x 20 seconds grade II mobilizations each side Median nerve glides 15x each UE (more limited on the L), ulnar nerve glide 15x each UE   chin tucks 15x 3 second holds with scapular retraction   abduction isometric for L mid delt activation 12x  3 second holds   Prone:  Grade II-III mobilizations to thoracic spine with reduced hypomobility with repetition 4x 10 seconds each level J mobilization to upper thoracic/lower cervical for reduced thoracic kyphosis. 3x30 seconds  Inferio-medial rib glides L grade II-III 6x 10 seconds in varying planes of motion for reduction of tension and improve posture; hypomobile and tender initially in rib 6 and 7.   STM to upper trap, levator scap, cervical paraspinals x5 minutes with implementation of effleurage and ptrissage for muscle tissue tension reduction.       sidelying  LUE Abduction : palm down elbow extended 10x to 80 degree range for middle delt activation ER: with stabilization 15x with 2lb dumbbell    Pt educated throughout session about proper posture and technique with exercises. Improved exercise technique, movement at target joints, use of target muscles after min to mod verbal, visual, tactile cues.                     PT Education - 03/10/20 0900    Education Details exercise technique, body mechanics, nerve glides    Person(s) Educated Patient    Methods Explanation;Demonstration;Tactile cues;Verbal cues    Comprehension Verbalized understanding;Returned demonstration;Verbal cues required;Tactile cues required            PT Short Term Goals - 03/03/20 1416      PT SHORT TERM GOAL #1   Title Patient will be independent in home exercise program to improve strength/mobility/pain relief for better functional independence with ADLs.    Baseline 8/11; HEP given 10/5: HEP compliant    Time 4    Period Weeks    Status Achieved    Target Date 02/05/20             PT Long Term Goals - 03/03/20 1417      PT LONG TERM GOAL #1   Title Patient will report a worst pain of 3/10 on VAS in cervical spine to improve tolerance with ADLs and reduced  symptoms with activities.    Baseline 8/11: 8/10 10/5: 5/10    Time 8    Period Weeks    Status Partially Met    Target Date 04/28/20      PT LONG TERM GOAL #2   Title Patient will increase FOTO score to equal to or greater than  50/100   to demonstrate statistically significant improvement in mobility and quality of life.    Baseline 8/11: 40% 10/5: 10/5: 53%    Time 8  Period Weeks    Status Achieved      PT LONG TERM GOAL #3   Title Patient will reduce Neck Disability Index score to <10% to demonstrate minimal disability with ADL's including improved sleeping tolerance, sitting tolerance, etc for better mobility at home and work.    Baseline 8/11: 62% 10/5: 38%    Time 8    Period Weeks    Status Partially Met    Target Date 04/28/20      PT LONG TERM GOAL #4   Title Patient will increase cervical AROM within 10 degrees of functional range for improved function, ability to drive, and return to PLOF.    Baseline 8/11: extension deferred rotation: R 28 L 40, SB R 10 L 4 10/5: see note    Time 8    Period Weeks    Status Partially Met    Target Date 04/28/20                 Plan - 03/10/20 7847    Clinical Impression Statement Patient progressing with decreased tension of upper trap and levator scap. She does present with new radiating symptoms down R arm but reports a decrease of radiation of symptoms down L arm. Combined manual and therex results in decreased symptoms per patient report. Patient would benefit from skilled physical therapy to reduce pain, improve muscle tissue length and posture, and return patient to PLOF    Personal Factors and Comorbidities Age;Comorbidity 3+;Past/Current Experience;Profession;Social Background;Time since onset of injury/illness/exacerbation    Comorbidities hyperglycemia, elevated BP, arthritis, HLD, restless legs, chronic insomnia, prior vertiginous migraine, thalamic CVA, anxiety, CAD, depression, GERD, hx of cancer, IBS, lumbar  disc disease (prior back surgery with fusion x2 L4-5, S1), and cervical disc disease.    Examination-Activity Limitations Bed Mobility;Bend;Caring for Others;Carry;Dressing;Sleep;Reach Overhead;Lift;Hygiene/Grooming;Stairs;Bathing;Sit    Examination-Participation Restrictions Cleaning;Community Activity;Driving;Interpersonal Relationship;Laundry;Volunteer;Meal Prep;Yard Work;Other    Stability/Clinical Decision Making Evolving/Moderate complexity    Rehab Potential Fair    PT Frequency 2x / week    PT Duration 8 weeks    PT Treatment/Interventions ADLs/Self Care Home Management;Biofeedback;Cryotherapy;Electrical Stimulation;Iontophoresis 32m/ml Dexamethasone;Moist Heat;Traction;Ultrasound;DME Instruction;Functional mobility training;Therapeutic activities;Patient/family education;Neuromuscular re-education;Therapeutic exercise;Manual techniques;Energy conservation;Dry needling;Passive range of motion;Taping;Vestibular;Vasopneumatic Device;Visual/perceptual remediation/compensation;Canalith Repostioning;Balance training    PT Next Visit Plan nerve glides, STM, posture    PT Home Exercise Plan median and ulnar modified nerve glides    Consulted and Agree with Plan of Care Patient           Patient will benefit from skilled therapeutic intervention in order to improve the following deficits and impairments:  Decreased activity tolerance, Decreased mobility, Decreased range of motion, Decreased strength, Hypomobility, Impaired flexibility, Impaired perceived functional ability, Increased muscle spasms, Postural dysfunction, Improper body mechanics, Pain, Decreased knowledge of precautions, Decreased scar mobility  Visit Diagnosis: Cervicalgia  Abnormal posture  Radiculopathy, cervical region  Muscle weakness (generalized)     Problem List Patient Active Problem List   Diagnosis Date Noted  . PFO (patent foramen ovale)   . CAD (coronary artery disease), native coronary artery   . [place]  [place]  . Depression 10/16/2017  . Migraine 10/16/2017  . Hyperglycemia, unspecified 07/10/2017  . Hyperglycemia 07/10/2017  . H/O dizziness 08/02/2016  . Cervical disc disorder with radiculopathy of cervical region 04/12/2016  . Nonallopathic lesion of thoracic region 04/12/2016  . Nonallopathic lesion of rib cage 04/12/2016  . Facet arthritis of cervical region 03/21/2016  . Hyperlipidemia 01/26/2015  . Labral tear of shoulder,  right, subsequent encounter 12/04/2014  . 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 Impingement syndrome of right shoulder 05/28/2014  . Right supraspinatus tenosynovitis 05/28/2014  . Arthritis pain 04/03/2014  . Fatigue 04/03/2014  . 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 IBS (irritable bowel syndrome) 06/20/2012  . Lumbar disc disease 06/20/2012   Janna Arch, PT, DPT   03/10/2020, 9:29 AM  [place] MAIN [facility] SERVICES [address] [place], [place], [address] Phone: [phone]   Fax:  [phone]  Name: Lori Morales MRN: [id number] Date of Birth: 06-30-1960

## 2020-03-12 ENCOUNTER — Other Ambulatory Visit: Payer: Self-pay

## 2020-03-12 ENCOUNTER — Ambulatory Visit: Payer: 59

## 2020-03-12 DIAGNOSIS — R293 Abnormal posture: Secondary | ICD-10-CM

## 2020-03-12 DIAGNOSIS — M5412 Radiculopathy, cervical region: Secondary | ICD-10-CM | POA: Diagnosis not present

## 2020-03-12 DIAGNOSIS — M542 Cervicalgia: Secondary | ICD-10-CM | POA: Diagnosis not present

## 2020-03-12 DIAGNOSIS — M6281 Muscle weakness (generalized): Secondary | ICD-10-CM | POA: Diagnosis not present

## 2020-03-12 NOTE — Therapy (Signed)
[place] MAIN [facility] SERVICES [address] [place], [place], [address] Phone: [phone]   Fax:  [phone]  Physical Therapy Treatment  Patient Details  Name: Lori Morales MRN: [id number] Date of Birth: [birth year] Referring Provider (PT): Dr. Earleen Newport    Encounter Date: 03/12/2020   PT End of Session - 03/12/20 0904    Visit Number 13    Number of Visits 26    Date for PT Re-Evaluation 04/28/20    Authorization Type 3/10 PN 03/03/20    PT Start Time 0848    PT Stop Time 0929    PT Time Calculation (min) 41 min    Activity Tolerance Patient tolerated treatment well   lack of protocol   Behavior During Therapy [facility] for tasks assessed/performed           Past Medical History:  Diagnosis Date   Arthritis    CAD (coronary artery disease), native coronary artery    mild non calcified plaque in mid LAD and prox LCx on coronary CTA 01/2019   Depression    GERD (gastroesophageal reflux disease)    Headache    migraines   Hx of squamous cell carcinoma 07/11/2016   Right distal dorsum lat forearm    Hypertension    IBS (irritable bowel syndrome)    Palpitations    PFO (patent foramen ovale)    small by cardiac CTA 01/2019   PONV (postoperative nausea and vomiting)    Restless legs syndrome     Past Surgical History:  Procedure Laterality Date   ABDOMINOPLASTY     BACK SURGERY     1 laminectomy, 1 fusion   COLONOSCOPY     COLONOSCOPY WITH PROPOFOL N/A 01/17/2017   Procedure: COLONOSCOPY WITH PROPOFOL;  Surgeon: Lollie Sails, MD;  Location: [facility] ENDOSCOPY;  Service: Endoscopy;  Laterality: N/A;   WISDOM TOOTH EXTRACTION      There were no vitals filed for this visit.   Subjective Assessment - 03/12/20 0901    Subjective Patient reports her R arm symptoms have improved. All symptoms are improving but continues to have weakness with prolonged holding head up.    Pertinent History Patient returning to  PT after C6-7 arthroplasty with spur removal and discectomy on 12/06/19.  After one week started trying to quilt and had bad pain in her neck. Ever since surgery had pain in L side and shoulder with arm to forearm; can't sleep on L side right now. Is on a steroid taper now for reduction of pain. Was not given a protocol but was told no lifting more than 10lb, no cervical extension. Reports the surgeon wants to be conservative with treatment at the beginning. PMH of hyperglycemia, elevated BP, arthritis, HLD, restless legs, chronic insomnia, prior vertiginous migraine, thalamic CVA, anxiety, CAD, depression, GERD, hx of cancer, IBS, lumbar disc disease (prior back surgery with fusion x2 L4-5, S1), and cervical disc disease. 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    Limitations Sitting;Lifting;Standing;House hold activities;Reading    How long can you sit comfortably? neck pain limits sitting position    How long can you stand comfortably? n/a    How long can you walk comfortably? 2 miles    Patient Stated Goals decrease pain, improve ROM    Currently in Pain? Yes    Pain Score 1     Pain Location Neck  Pain Orientation Right;Left    Pain Descriptors / Indicators Aching    Pain Type Chronic pain    Pain Onset More than a month ago    Pain Frequency Intermittent                 supine: use of heat pad under upper thoracic lower cervical region Cervical sidebend with gentle overpressure 3x30 second holds each direction  cervical rotation with gentle overpressure 3x 30 second holds each direction Suboccipital release 3x30 second holds  Cervical AROM extension 10x 3 second hold ; use of rolled up towel under neck for pain reduction  Cervical AROM alternating L and R rotation 10x  First rib mobilization 3x 20 seconds grade II mobilizations each side Median nerve glides 15x each UE (more limited on the L), ulnar nerve glide 15x each  UE  chin tucks 15x 3 second holds with scapular retraction   abduction isometric for L mid delt activation 12x  3 second holds  RTB ER with scapular retraction x 15  Prone:  Grade II-III mobilizations to thoracic spine with reduced hypomobility with repetition 4x 10 seconds each level J mobilization to upper thoracic/lower cervical for reduced thoracic kyphosis. 3x30 seconds  Inferio-medial rib glides L grade II-III 6x 10 seconds in varying planes of motion for reduction of tension and improve posture; hypomobile and tender initially in rib 6 and 7.   STM to upper trap, levator scap, cervical paraspinals x5 minutes with implementation of effleurage and ptrissage for muscle tissue tension reduction.    Seated: 2lb dumbell abduction 10x, ER 10x LLE    Pt educated throughout session about proper posture and technique with exercises. Improved exercise technique, movement at target joints, use of target muscles after min to mod verbal, visual, tactile cues.                       PT Education - 03/12/20 0903    Education Details exercise technique, body mechanics, manual    Person(s) Educated Patient    Methods Explanation;Demonstration;Tactile cues;Verbal cues    Comprehension Verbalized understanding;Returned demonstration;Verbal cues required;Tactile cues required            PT Short Term Goals - 03/03/20 1416      PT SHORT TERM GOAL #1   Title Patient will be independent in home exercise program to improve strength/mobility/pain relief for better functional independence with ADLs.    Baseline 8/11; HEP given 10/5: HEP compliant    Time 4    Period Weeks    Status Achieved    Target Date 02/05/20             PT Long Term Goals - 03/03/20 1417      PT LONG TERM GOAL #1   Title Patient will report a worst pain of 3/10 on VAS in cervical spine to improve tolerance with ADLs and reduced symptoms with activities.    Baseline 8/11: 8/10 10/5: 5/10    Time 8     Period Weeks    Status Partially Met    Target Date 04/28/20      PT LONG TERM GOAL #2   Title Patient will increase FOTO score to equal to or greater than  50/100   to demonstrate statistically significant improvement in mobility and quality of life.    Baseline 8/11: 40% 10/5: 10/5: 53%    Time 8    Period Weeks    Status Achieved  PT LONG TERM GOAL #3   Title Patient will reduce Neck Disability Index score to <10% to demonstrate minimal disability with ADLs including improved sleeping tolerance, sitting tolerance, etc for better mobility at home and work.    Baseline 8/11: 62% 10/5: 38%    Time 8    Period Weeks    Status Partially Met    Target Date 04/28/20      PT LONG TERM GOAL #4   Title Patient will increase cervical AROM within 10 degrees of functional range for improved function, ability to drive, and return to PLOF.    Baseline 8/11: extension deferred rotation: R 28 L 40, SB R 10 L 4 10/5: see note    Time 8    Period Weeks    Status Partially Met    Target Date 04/28/20                 Plan - 03/12/20 1035    Clinical Impression Statement Patient demonstrates improved symptoms with decrease radiating neurological impairments. Strengthening and stabilization interventions performed and tolerated well. No pain reported throughout session Patient would benefit from skilled physical therapy to reduce pain, improve muscle tissue length and posture, and return patient to PLOF    Personal Factors and Comorbidities Age;Comorbidity 3+;Past/Current Experience;Profession;Social Background;Time since onset of injury/illness/exacerbation    Comorbidities hyperglycemia, elevated BP, arthritis, HLD, restless legs, chronic insomnia, prior vertiginous migraine, thalamic CVA, anxiety, CAD, depression, GERD, hx of cancer, IBS, lumbar disc disease (prior back surgery with fusion x2 L4-5, S1), and cervical disc disease.    Examination-Activity Limitations Bed  Mobility;Bend;Caring for Others;Carry;Dressing;Sleep;Reach Overhead;Lift;Hygiene/Grooming;Stairs;Bathing;Sit    Examination-Participation Restrictions Cleaning;Community Activity;Driving;Interpersonal Relationship;Laundry;Volunteer;Meal Prep;Yard Work;Other    Stability/Clinical Decision Making Evolving/Moderate complexity    Rehab Potential Fair    PT Frequency 2x / week    PT Duration 8 weeks    PT Treatment/Interventions ADLs/Self Care Home Management;Biofeedback;Cryotherapy;Electrical Stimulation;Iontophoresis 19m/ml Dexamethasone;Moist Heat;Traction;Ultrasound;DME Instruction;Functional mobility training;Therapeutic activities;Patient/family education;Neuromuscular re-education;Therapeutic exercise;Manual techniques;Energy conservation;Dry needling;Passive range of motion;Taping;Vestibular;Vasopneumatic Device;Visual/perceptual remediation/compensation;Canalith Repostioning;Balance training    PT Next Visit Plan nerve glides, STM, posture    PT Home Exercise Plan median and ulnar modified nerve glides    Consulted and Agree with Plan of Care Patient           Patient will benefit from skilled therapeutic intervention in order to improve the following deficits and impairments:  Decreased activity tolerance, Decreased mobility, Decreased range of motion, Decreased strength, Hypomobility, Impaired flexibility, Impaired perceived functional ability, Increased muscle spasms, Postural dysfunction, Improper body mechanics, Pain, Decreased knowledge of precautions, Decreased scar mobility  Visit Diagnosis: Cervicalgia  Abnormal posture  Radiculopathy, cervical region  Muscle weakness (generalized)     Problem List Patient Active Problem List   Diagnosis Date Noted   PFO (patent foramen ovale)    CAD (coronary artery disease), native coronary artery    [place]   Depression 10/16/2017   Migraine 10/16/2017   Hyperglycemia,  unspecified 07/10/2017   Hyperglycemia 07/10/2017   H/O dizziness 08/02/2016   Cervical disc disorder with radiculopathy of cervical region 04/12/2016   Nonallopathic lesion of thoracic region 04/12/2016   Nonallopathic lesion of rib cage 04/12/2016   Facet arthritis of cervical region 03/21/2016   Hyperlipidemia 01/26/2015   Labral tear of shoulder, right, subsequent encounter 12/04/2014   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

## 2020-03-13 ENCOUNTER — Encounter: Payer: Self-pay | Admitting: Cardiovascular Disease

## 2020-03-13 ENCOUNTER — Ambulatory Visit: Payer: 59 | Admitting: Cardiovascular Disease

## 2020-03-17 ENCOUNTER — Ambulatory Visit: Payer: 59

## 2020-03-17 ENCOUNTER — Other Ambulatory Visit: Payer: Self-pay

## 2020-03-17 DIAGNOSIS — R293 Abnormal posture: Secondary | ICD-10-CM | POA: Diagnosis not present

## 2020-03-17 DIAGNOSIS — M5412 Radiculopathy, cervical region: Secondary | ICD-10-CM | POA: Diagnosis not present

## 2020-03-17 DIAGNOSIS — M6281 Muscle weakness (generalized): Secondary | ICD-10-CM

## 2020-03-17 DIAGNOSIS — M542 Cervicalgia: Secondary | ICD-10-CM | POA: Diagnosis not present

## 2020-03-17 NOTE — Therapy (Signed)
[place] MAIN [facility] SERVICES [address] [place], [place], [address] Phone: [phone]   Fax:  [phone]  Physical Therapy Treatment  Patient Details  Name: Lori Morales MRN: [id number] Date of Birth: 02-Jan-1961 Referring Provider (PT): Dr. Earleen Newport    Encounter Date: 03/17/2020   PT End of Session - 03/17/20 0857    Visit Number 14    Number of Visits 26    Date for PT Re-Evaluation 04/28/20    Authorization Type 4/10 PN 03/03/20    PT Start Time 0847    PT Stop Time 0929    PT Time Calculation (min) 42 min    Activity Tolerance Patient tolerated treatment well   lack of protocol   Behavior During Therapy [facility] for tasks assessed/performed           Past Medical History:  Diagnosis Date  . Arthritis   . CAD (coronary artery disease), native coronary artery    mild non calcified plaque in mid LAD and prox LCx on coronary CTA 01/2019  . Depression   . GERD (gastroesophageal reflux disease)   . Headache    migraines  . Hx of squamous cell carcinoma 07/11/2016   Right distal dorsum lat forearm   . Hypertension   . IBS (irritable bowel syndrome)   . Palpitations   . PFO (patent foramen ovale)    small by cardiac CTA 01/2019  . PONV (postoperative nausea and vomiting)   . Restless legs syndrome     Past Surgical History:  Procedure Laterality Date  . ABDOMINOPLASTY    . BACK SURGERY     1 laminectomy, 1 fusion  . COLONOSCOPY    . COLONOSCOPY WITH PROPOFOL N/A 01/17/2017   Procedure: COLONOSCOPY WITH PROPOFOL;  Surgeon: Lollie Sails, MD;  Location: [facility] ENDOSCOPY;  Service: Endoscopy;  Laterality: N/A;  . WISDOM TOOTH EXTRACTION      There were no vitals filed for this visit.   Subjective Assessment - 03/17/20 0855    Subjective Patient was able to sew last week and go to fair with grandchild with little pain increase. Continues to have head fatigue with prolonged holds.    Pertinent History Patient  returning to PT after C6-7 arthroplasty with spur removal and discectomy on 12/06/19.  After one week started trying to quilt and had bad pain in her neck. Ever since surgery had pain in L side and shoulder with arm to forearm; can't sleep on L side right now. Is on a steroid taper now for reduction of pain. Was not given a protocol but was told no lifting more than 10lb, no cervical extension. Reports the surgeon wants to be conservative with treatment at the beginning. PMH of hyperglycemia, elevated BP, arthritis, HLD, restless legs, chronic insomnia, prior vertiginous migraine, thalamic CVA, anxiety, CAD, depression, GERD, hx of cancer, IBS, lumbar disc disease (prior back surgery with fusion x2 L4-5, S1), and cervical disc disease. 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    Limitations Sitting;Lifting;Standing;House hold activities;Reading    How long can you sit comfortably? neck pain limits sitting position    How long can you stand comfortably? n/a    How long can you walk comfortably? 2 miles    Patient Stated Goals decrease pain, improve ROM    Currently in Pain? Yes    Pain Score 2     Pain Location Neck  Pain Orientation Right;Left    Pain Descriptors / Indicators Aching    Pain Type Chronic pain    Pain Onset More than a month ago    Pain Frequency Intermittent              supine: use of heat pad under upper thoracic lower cervical region Cervical sidebend with gentle overpressure 3x30 second holds each direction  cervical rotation with gentle overpressure 3x 30 second holds each direction Suboccipital release 3x30 second holds  Cervical AROM extension 10x 3 second hold ; use of rolled up towel under neck for pain reduction  Cervical AROM alternating L and R rotation 10x ; 10 second holds, cues to not pick head up  First rib mobilization 3x 20 seconds grade II mobilizations each side Median nerve glides 15x each UE  (more limited on the L), ulnar nerve glide 15x each UE  chin tucks 15x 3 second holds with scapular retraction   abduction isometric for L mid delt activation 12x  3 second holds    Prone:  Grade II-III mobilizations to thoracic spine with reduced hypomobility with repetition 4x 10 seconds each level J mobilization to upper thoracic/lower cervical for reduced thoracic kyphosis. 3x30 seconds  Inferio-medial rib glides L grade II-III 6x 10 seconds in varying planes of motion for reduction of tension and improve posture; hypomobile and tender initially in rib 6 and 7.   STM to upper trap, levator scap, cervical paraspinals x5 minutes with implementation of effleurage and ptrissage for muscle tissue tension reduction.    Seated: 2lb dumbell abduction 10x, ER 10x LLE  Bilateral arm abduction into overhead raise stretch , breathe out and return back to center x 8 Towel assisted cervical extensions 10x with cues for scapular retraction and depression for optimal alignment  Pt educated throughout session about proper posture and technique with exercises. Improved exercise technique, movement at target joints, use of target muscles after min to mod verbal, visual, tactile cues.                           PT Education - 03/17/20 0857    Education Details exercise technique, body mechanics, manual, posture    Person(s) Educated Patient    Methods Explanation;Demonstration;Tactile cues;Verbal cues    Comprehension Verbalized understanding;Returned demonstration;Verbal cues required;Tactile cues required            PT Short Term Goals - 03/03/20 1416      PT SHORT TERM GOAL #1   Title Patient will be independent in home exercise program to improve strength/mobility/pain relief for better functional independence with ADLs.    Baseline 8/11; HEP given 10/5: HEP compliant    Time 4    Period Weeks    Status Achieved    Target Date 02/05/20             PT Long Term  Goals - 03/03/20 1417      PT LONG TERM GOAL #1   Title Patient will report a worst pain of 3/10 on VAS in cervical spine to improve tolerance with ADLs and reduced symptoms with activities.    Baseline 8/11: 8/10 10/5: 5/10    Time 8    Period Weeks    Status Partially Met    Target Date 04/28/20      PT LONG TERM GOAL #2   Title Patient will increase FOTO score to equal to or greater than  50/100   to  demonstrate statistically significant improvement in mobility and quality of life.    Baseline 8/11: 40% 10/5: 10/5: 53%    Time 8    Period Weeks    Status Achieved      PT LONG TERM GOAL #3   Title Patient will reduce Neck Disability Index score to <10% to demonstrate minimal disability with ADL's including improved sleeping tolerance, sitting tolerance, etc for better mobility at home and work.    Baseline 8/11: 62% 10/5: 38%    Time 8    Period Weeks    Status Partially Met    Target Date 04/28/20      PT LONG TERM GOAL #4   Title Patient will increase cervical AROM within 10 degrees of functional range for improved function, ability to drive, and return to PLOF.    Baseline 8/11: extension deferred rotation: R 28 L 40, SB R 10 L 4 10/5: see note    Time 8    Period Weeks    Status Partially Met    Target Date 04/28/20                 Plan - 03/17/20 0944    Clinical Impression Statement Patient progressing with functional postural alignment as well as capacity to maintain neutral alignment with reduced episodes of pain. Patient is to return back to work full time starting next week and will benefit form therapy following patient until fully integrated into work with minimal regressions. Patient would benefit from skilled physical therapy to reduce pain, improve muscle tissue length and posture, and return patient to PLOF    Personal Factors and Comorbidities Age;Comorbidity 3+;Past/Current Experience;Profession;Social Background;Time since onset of  injury/illness/exacerbation    Comorbidities hyperglycemia, elevated BP, arthritis, HLD, restless legs, chronic insomnia, prior vertiginous migraine, thalamic CVA, anxiety, CAD, depression, GERD, hx of cancer, IBS, lumbar disc disease (prior back surgery with fusion x2 L4-5, S1), and cervical disc disease.    Examination-Activity Limitations Bed Mobility;Bend;Caring for Others;Carry;Dressing;Sleep;Reach Overhead;Lift;Hygiene/Grooming;Stairs;Bathing;Sit    Examination-Participation Restrictions Cleaning;Community Activity;Driving;Interpersonal Relationship;Laundry;Volunteer;Meal Prep;Yard Work;Other    Stability/Clinical Decision Making Evolving/Moderate complexity    Rehab Potential Fair    PT Frequency 2x / week    PT Duration 8 weeks    PT Treatment/Interventions ADLs/Self Care Home Management;Biofeedback;Cryotherapy;Electrical Stimulation;Iontophoresis 82m/ml Dexamethasone;Moist Heat;Traction;Ultrasound;DME Instruction;Functional mobility training;Therapeutic activities;Patient/family education;Neuromuscular re-education;Therapeutic exercise;Manual techniques;Energy conservation;Dry needling;Passive range of motion;Taping;Vestibular;Vasopneumatic Device;Visual/perceptual remediation/compensation;Canalith Repostioning;Balance training    PT Next Visit Plan nerve glides, STM, posture    PT Home Exercise Plan median and ulnar modified nerve glides    Consulted and Agree with Plan of Care Patient           Patient will benefit from skilled therapeutic intervention in order to improve the following deficits and impairments:  Decreased activity tolerance, Decreased mobility, Decreased range of motion, Decreased strength, Hypomobility, Impaired flexibility, Impaired perceived functional ability, Increased muscle spasms, Postural dysfunction, Improper body mechanics, Pain, Decreased knowledge of precautions, Decreased scar mobility  Visit Diagnosis: Cervicalgia  Abnormal posture  Radiculopathy,  cervical region  Muscle weakness (generalized)     Problem List Patient Active Problem List   Diagnosis Date Noted  . PFO (patent foramen ovale)   . CAD (coronary artery disease), native coronary artery   . [place]  . Depression 10/16/2017  . Migraine 10/16/2017  . Hyperglycemia, unspecified 07/10/2017  . Hyperglycemia 07/10/2017  . H/O dizziness 08/02/2016  . Cervical disc disorder with radiculopathy of cervical region 04/12/2016  . Nonallopathic lesion  of thoracic region 04/12/2016  . Nonallopathic lesion of rib cage 04/12/2016  . Facet arthritis of cervical region 03/21/2016  . Hyperlipidemia 01/26/2015  . Labral tear of shoulder, right, subsequent encounter 12/04/2014  . 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 Impingement syndrome of right shoulder 05/28/2014  . Right supraspinatus tenosynovitis 05/28/2014  . Arthritis pain 04/03/2014  . Fatigue 04/03/2014  . 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 IBS (irritable bowel syndrome) 06/20/2012  . Lumbar disc disease 06/20/2012   Janna Arch, PT, DPT   03/17/2020, 9:45 AM  [place] [facility] MAIN [facility] SERVICES [address] [place], [place], [address] Phone: [phone]   Fax:  [phone]  Name: Lori Morales MRN: [id number] Date of Birth: 1960/06/05

## 2020-03-19 ENCOUNTER — Ambulatory Visit: Payer: 59

## 2020-03-19 ENCOUNTER — Other Ambulatory Visit: Payer: Self-pay

## 2020-03-19 DIAGNOSIS — M6281 Muscle weakness (generalized): Secondary | ICD-10-CM

## 2020-03-19 DIAGNOSIS — R293 Abnormal posture: Secondary | ICD-10-CM

## 2020-03-19 DIAGNOSIS — M542 Cervicalgia: Secondary | ICD-10-CM

## 2020-03-19 DIAGNOSIS — M5412 Radiculopathy, cervical region: Secondary | ICD-10-CM

## 2020-03-19 NOTE — Therapy (Signed)
[place] MAIN [facility] SERVICES [address] [place], [place], [address] Phone: [phone]   Fax:  [phone]  Physical Therapy Treatment  Patient Details  Name: Lori Morales MRN: [id number] Date of Birth: [birth year] Referring Provider (PT): Dr. Earleen Newport    Encounter Date: 03/19/2020   PT End of Session - 03/19/20 0946    Visit Number 15    Number of Visits 26    Date for PT Re-Evaluation 04/28/20    Authorization Type 5/10 PN 03/03/20    PT Start Time 0846    PT Stop Time 0929    PT Time Calculation (min) 43 min    Activity Tolerance Patient tolerated treatment well   lack of protocol   Behavior During Therapy [facility] for tasks assessed/performed           Past Medical History:  Diagnosis Date  . Arthritis   . CAD (coronary artery disease), native coronary artery    mild non calcified plaque in mid LAD and prox LCx on coronary CTA 01/2019  . Depression   . GERD (gastroesophageal reflux disease)   . Headache    migraines  . Hx of squamous cell carcinoma 07/11/2016   Right distal dorsum lat forearm   . Hypertension   . IBS (irritable bowel syndrome)   . Palpitations   . PFO (patent foramen ovale)    small by cardiac CTA 01/2019  . PONV (postoperative nausea and vomiting)   . Restless legs syndrome     Past Surgical History:  Procedure Laterality Date  . ABDOMINOPLASTY    . BACK SURGERY     1 laminectomy, 1 fusion  . COLONOSCOPY    . COLONOSCOPY WITH PROPOFOL N/A 01/17/2017   Procedure: COLONOSCOPY WITH PROPOFOL;  Surgeon: Lollie Sails, MD;  Location: [facility] ENDOSCOPY;  Service: Endoscopy;  Laterality: N/A;  . WISDOM TOOTH EXTRACTION      There were no vitals filed for this visit.   Subjective Assessment - 03/19/20 0900    Subjective Patient reports radiating pain down L arm yeseterday, did her stretches but due to hectic schedule was not able to subdue initially.    Pertinent History Patient returning to PT  after C6-7 arthroplasty with spur removal and discectomy on 12/06/19.  After one week started trying to quilt and had bad pain in her neck. Ever since surgery had pain in L side and shoulder with arm to forearm; can't sleep on L side right now. Is on a steroid taper now for reduction of pain. Was not given a protocol but was told no lifting more than 10lb, no cervical extension. Reports the surgeon wants to be conservative with treatment at the beginning. PMH of hyperglycemia, elevated BP, arthritis, HLD, restless legs, chronic insomnia, prior vertiginous migraine, thalamic CVA, anxiety, CAD, depression, GERD, hx of cancer, IBS, lumbar disc disease (prior back surgery with fusion x2 L4-5, S1), and cervical disc disease. 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    Limitations Sitting;Lifting;Standing;House hold activities;Reading    How long can you sit comfortably? neck pain limits sitting position    How long can you stand comfortably? n/a    How long can you walk comfortably? 2 miles    Patient Stated Goals decrease pain, improve ROM    Currently in Pain? Yes    Pain Score 2     Pain Location Neck  Pain Orientation Right;Left    Pain Descriptors / Indicators Aching    Pain Type Chronic pain    Pain Onset More than a month ago    Pain Frequency Intermittent                 supine: use of heat pad under upper thoracic lower cervical region Cervical sidebend with gentle overpressure 3x30 second holds each direction  cervical rotation with gentle overpressure 3x 30 second holds each direction Suboccipital release 3x30 second holds Cervical AROM extension 10x 3 second hold; use of rolled up towel under neck for pain reduction; added change  LUE AP 10x 3 second pulses Median nerve glides 15x each UE (more limited on the L), ulnar nerve glide 15x each UE chin tucks 15x 3 second holdswith scapular  retraction    Prone: Grade II-III mobilizations to thoracic spine with reduced hypomobility with repetition4x 10 seconds each level J mobilization to upper thoracic/lower cervical for reduced thoracic kyphosis. 3x30 seconds Inferio-medial rib glides L grade II-III 6x 10 seconds in varying planes of motion for reduction of tension and improve posture; hypomobile and tender initially in rib 6 and 7. STM to upper trap, levator scap, cervical paraspinals x35mnutes with implementation of effleurage and ptrissage for muscle tissue tension reduction.  Sidelying: 3lb dumbbell: ER 15x Flexed elbow abduction 20x with focus on slow contraction eccentric/concentric   Seated: Scapular retraction with overpressure into depression  10x 10 second holds LUE Bilateral arm abduction into overhead raise stretch , breathe out and return back to center x 8 Towel assisted cervical extensions 10x with cues for scapular retraction and depression for optimal alignment ulnar nerve glide (reminder for HEP when radiating symptoms)  Pt educated throughout session about proper posture and technique with exercises. Improved exercise technique, movement at target joints, use of target muscles after min to mod verbal, visual, tactile cues.                        PT Education - 03/19/20 0900    Education Details exercise technique, body mechanics, manual    Person(s) Educated Patient    Methods Explanation;Demonstration;Tactile cues;Verbal cues    Comprehension Verbalized understanding;Returned demonstration;Verbal cues required;Tactile cues required            PT Short Term Goals - 03/03/20 1416      PT SHORT TERM GOAL #1   Title Patient will be independent in home exercise program to improve strength/mobility/pain relief for better functional independence with ADLs.    Baseline 8/11; HEP given 10/5: HEP compliant    Time 4    Period Weeks    Status Achieved    Target Date  02/05/20             PT Long Term Goals - 03/03/20 1417      PT LONG TERM GOAL #1   Title Patient will report a worst pain of 3/10 on VAS in cervical spine to improve tolerance with ADLs and reduced symptoms with activities.    Baseline 8/11: 8/10 10/5: 5/10    Time 8    Period Weeks    Status Partially Met    Target Date 04/28/20      PT LONG TERM GOAL #2   Title Patient will increase FOTO score to equal to or greater than  50/100   to demonstrate statistically significant improvement in mobility and quality of life.    Baseline 8/11: 40% 10/5: 10/5: 53%  Time 8    Period Weeks    Status Achieved      PT LONG TERM GOAL #3   Title Patient will reduce Neck Disability Index score to <10% to demonstrate minimal disability with ADL's including improved sleeping tolerance, sitting tolerance, etc for better mobility at home and work.    Baseline 8/11: 62% 10/5: 38%    Time 8    Period Weeks    Status Partially Met    Target Date 04/28/20      PT LONG TERM GOAL #4   Title Patient will increase cervical AROM within 10 degrees of functional range for improved function, ability to drive, and return to PLOF.    Baseline 8/11: extension deferred rotation: R 28 L 40, SB R 10 L 4 10/5: see note    Time 8    Period Weeks    Status Partially Met    Target Date 04/28/20                 Plan - 03/19/20 0948    Clinical Impression Statement Patient initially presents with increased forward head rounded shoulders positioning that is improved with postural cueing, therex, and manual with decreased radiating symptoms reported by end of session. Patient requires reminder of performance of nerve glides when radiating symptoms occurring. Patient is to return back to work full time starting next week and will benefit form therapy following patient until fully integrated into work with minimal regressions. Patient would benefit from skilled physical therapy to reduce pain, improve muscle  tissue length and posture, and return patient to PLOF    Personal Factors and Comorbidities Age;Comorbidity 3+;Past/Current Experience;Profession;Social Background;Time since onset of injury/illness/exacerbation    Comorbidities hyperglycemia, elevated BP, arthritis, HLD, restless legs, chronic insomnia, prior vertiginous migraine, thalamic CVA, anxiety, CAD, depression, GERD, hx of cancer, IBS, lumbar disc disease (prior back surgery with fusion x2 L4-5, S1), and cervical disc disease.    Examination-Activity Limitations Bed Mobility;Bend;Caring for Others;Carry;Dressing;Sleep;Reach Overhead;Lift;Hygiene/Grooming;Stairs;Bathing;Sit    Examination-Participation Restrictions Cleaning;Community Activity;Driving;Interpersonal Relationship;Laundry;Volunteer;Meal Prep;Yard Work;Other    Stability/Clinical Decision Making Evolving/Moderate complexity    Rehab Potential Fair    PT Frequency 2x / week    PT Duration 8 weeks    PT Treatment/Interventions ADLs/Self Care Home Management;Biofeedback;Cryotherapy;Electrical Stimulation;Iontophoresis 61m/ml Dexamethasone;Moist Heat;Traction;Ultrasound;DME Instruction;Functional mobility training;Therapeutic activities;Patient/family education;Neuromuscular re-education;Therapeutic exercise;Manual techniques;Energy conservation;Dry needling;Passive range of motion;Taping;Vestibular;Vasopneumatic Device;Visual/perceptual remediation/compensation;Canalith Repostioning;Balance training    PT Next Visit Plan nerve glides, STM, posture    PT Home Exercise Plan median and ulnar modified nerve glides    Consulted and Agree with Plan of Care Patient           Patient will benefit from skilled therapeutic intervention in order to improve the following deficits and impairments:  Decreased activity tolerance, Decreased mobility, Decreased range of motion, Decreased strength, Hypomobility, Impaired flexibility, Impaired perceived functional ability, Increased muscle spasms,  Postural dysfunction, Improper body mechanics, Pain, Decreased knowledge of precautions, Decreased scar mobility  Visit Diagnosis: Cervicalgia  Abnormal posture  Radiculopathy, cervical region  Muscle weakness (generalized)     Problem List Patient Active Problem List   Diagnosis Date Noted  . PFO (patent foramen ovale)   . CAD (coronary artery disease), native coronary artery   . [place]  . Depression 10/16/2017  . Migraine 10/16/2017  . Hyperglycemia, unspecified 07/10/2017  . Hyperglycemia 07/10/2017  . H/O dizziness 08/02/2016  . Cervical disc disorder with radiculopathy of cervical region 04/12/2016  . Nonallopathic lesion of  thoracic region 04/12/2016  . Nonallopathic lesion of rib cage 04/12/2016  . Facet arthritis of cervical region 03/21/2016  . Hyperlipidemia 01/26/2015  . Labral tear of shoulder, right, subsequent encounter 12/04/2014  . 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 Impingement syndrome of right shoulder 05/28/2014  . Right supraspinatus tenosynovitis 05/28/2014  . Arthritis pain 04/03/2014  . Fatigue 04/03/2014  . 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 IBS (irritable bowel syndrome) 06/20/2012  . Lumbar disc disease 06/20/2012   Janna Arch, PT, DPT   03/19/2020, 9:57 AM  [place] MAIN [facility] SERVICES [address] [place], [place], [address] Phone: [phone]   Fax:  [phone]  Name: URSULA DERMODY MRN: [id number] Date of Birth: 1960/12/17

## 2020-03-23 DIAGNOSIS — D696 Thrombocytopenia, unspecified: Secondary | ICD-10-CM | POA: Diagnosis not present

## 2020-03-24 ENCOUNTER — Other Ambulatory Visit: Payer: Self-pay

## 2020-03-24 ENCOUNTER — Ambulatory Visit: Payer: 59

## 2020-03-24 DIAGNOSIS — M5412 Radiculopathy, cervical region: Secondary | ICD-10-CM

## 2020-03-24 DIAGNOSIS — R293 Abnormal posture: Secondary | ICD-10-CM | POA: Diagnosis not present

## 2020-03-24 DIAGNOSIS — M542 Cervicalgia: Secondary | ICD-10-CM

## 2020-03-24 DIAGNOSIS — M6281 Muscle weakness (generalized): Secondary | ICD-10-CM | POA: Diagnosis not present

## 2020-03-24 NOTE — Therapy (Signed)
[place] MAIN [facility] SERVICES [address] [place], [place], [address] Phone: [phone]   Fax:  [phone]  Physical Therapy Treatment  Patient Details  Name: Lori Morales MRN: [id number] Date of Birth: August 27, 1960 Referring Provider (PT): Dr. Earleen Newport    Encounter Date: 03/24/2020   PT End of Session - 03/24/20 0858    Visit Number 16    Number of Visits 26    Date for PT Re-Evaluation 04/28/20    Authorization Type 6/10 PN 03/03/20    PT Start Time 0848    PT Stop Time 0929    PT Time Calculation (min) 41 min    Activity Tolerance Patient tolerated treatment well   lack of protocol   Behavior During Therapy [facility] for tasks assessed/performed           Past Medical History:  Diagnosis Date  . Arthritis   . CAD (coronary artery disease), native coronary artery    mild non calcified plaque in mid LAD and prox LCx on coronary CTA 01/2019  . Depression   . GERD (gastroesophageal reflux disease)   . Headache    migraines  . Hx of squamous cell carcinoma 07/11/2016   Right distal dorsum lat forearm   . Hypertension   . IBS (irritable bowel syndrome)   . Palpitations   . PFO (patent foramen ovale)    small by cardiac CTA 01/2019  . PONV (postoperative nausea and vomiting)   . Restless legs syndrome     Past Surgical History:  Procedure Laterality Date  . ABDOMINOPLASTY    . BACK SURGERY     1 laminectomy, 1 fusion  . COLONOSCOPY    . COLONOSCOPY WITH PROPOFOL N/A 01/17/2017   Procedure: COLONOSCOPY WITH PROPOFOL;  Surgeon: Lollie Sails, MD;  Location: [facility] ENDOSCOPY;  Service: Endoscopy;  Laterality: N/A;  . WISDOM TOOTH EXTRACTION      There were no vitals filed for this visit.   Subjective Assessment - 03/24/20 0856    Subjective Patient reports high stress at work but neck has been compliant with HEP. Able to tolerate upright posture for longer durations. Will see doctor tomorrow    Pertinent History  Patient returning to PT after C6-7 arthroplasty with spur removal and discectomy on 12/06/19.  After one week started trying to quilt and had bad pain in her neck. Ever since surgery had pain in L side and shoulder with arm to forearm; can't sleep on L side right now. Is on a steroid taper now for reduction of pain. Was not given a protocol but was told no lifting more than 10lb, no cervical extension. Reports the surgeon wants to be conservative with treatment at the beginning. PMH of hyperglycemia, elevated BP, arthritis, HLD, restless legs, chronic insomnia, prior vertiginous migraine, thalamic CVA, anxiety, CAD, depression, GERD, hx of cancer, IBS, lumbar disc disease (prior back surgery with fusion x2 L4-5, S1), and cervical disc disease. 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    Limitations Sitting;Lifting;Standing;House hold activities;Reading    How long can you sit comfortably? neck pain limits sitting position    How long can you stand comfortably? n/a    How long can you walk comfortably? 2 miles    Patient Stated Goals decrease pain, improve ROM    Currently in Pain? Yes    Pain Score 1     Pain Location Neck  Pain Orientation Lower    Pain Descriptors / Indicators Aching    Pain Type Chronic pain    Pain Onset More than a month ago    Pain Frequency Intermittent               supine: use of heat pad under upper thoracic lower cervical region Cervical sidebend with gentle overpressure 3x30 second holds each direction  cervical rotation with gentle overpressure 3x 30 second holds each direction Suboccipital release 3x30 second holds  Cervical AROM extension 10x 3 second hold ; use of rolled up towel under neck for pain reduction; added change  LUE AP 10x 3 second pulses Median nerve glides 15x each UE (more limited on the L), ulnar nerve glide 15x each UE  chin tucks 15x 3 second holds with scapular retraction   RTB  ER 15x      Prone:  Grade II-III mobilizations to thoracic spine with reduced hypomobility with repetition 4x 10 seconds each level J mobilization to upper thoracic/lower cervical for reduced thoracic kyphosis. 3x30 seconds  Inferio-medial rib glides L grade II-III 6x 10 seconds in varying planes of motion for reduction of tension and improve posture; hypomobile and tender initially in rib 6 and 7.   STM to upper trap, levator scap, cervical paraspinals x8 minutes with implementation of effleurage and ptrissage for muscle tissue tension reduction.     Seated: Scapular retraction with overpressure into depression  10x 10 second holds LUE Bilateral arm abduction with external rotation into overhead raise stretch , breathe out and return back to center x 8 Towel assisted cervical extensions 10x with cues for scapular retraction and depression for optimal alignment RTB abduction 15x LUE RTB single arm row 15x for scapular retraction and depression    Pt educated throughout session about proper posture and technique with exercises. Improved exercise technique, movement at target joints, use of target muscles after min to mod verbal, visual, tactile cues.                          PT Education - 03/24/20 0857    Education Details exercise technique, manual, posture    Person(s) Educated Patient    Methods Explanation;Demonstration;Tactile cues;Verbal cues    Comprehension Verbalized understanding;Returned demonstration;Verbal cues required;Tactile cues required            PT Short Term Goals - 03/03/20 1416      PT SHORT TERM GOAL #1   Title Patient will be independent in home exercise program to improve strength/mobility/pain relief for better functional independence with ADLs.    Baseline 8/11; HEP given 10/5: HEP compliant    Time 4    Period Weeks    Status Achieved    Target Date 02/05/20             PT Long Term Goals - 03/03/20 1417      PT LONG  TERM GOAL #1   Title Patient will report a worst pain of 3/10 on VAS in cervical spine to improve tolerance with ADLs and reduced symptoms with activities.    Baseline 8/11: 8/10 10/5: 5/10    Time 8    Period Weeks    Status Partially Met    Target Date 04/28/20      PT LONG TERM GOAL #2   Title Patient will increase FOTO score to equal to or greater than  50/100   to demonstrate statistically significant improvement in mobility and  quality of life.    Baseline 8/11: 40% 10/5: 10/5: 53%    Time 8    Period Weeks    Status Achieved      PT LONG TERM GOAL #3   Title Patient will reduce Neck Disability Index score to <10% to demonstrate minimal disability with ADL's including improved sleeping tolerance, sitting tolerance, etc for better mobility at home and work.    Baseline 8/11: 62% 10/5: 38%    Time 8    Period Weeks    Status Partially Met    Target Date 04/28/20      PT LONG TERM GOAL #4   Title Patient will increase cervical AROM within 10 degrees of functional range for improved function, ability to drive, and return to PLOF.    Baseline 8/11: extension deferred rotation: R 28 L 40, SB R 10 L 4 10/5: see note    Time 8    Period Weeks    Status Partially Met    Target Date 04/28/20                 Plan - 03/24/20 0942    Clinical Impression Statement Patient tolerating progression of strengthening stabilization muscles for posture as well as C5 of LUE for return to PLOF. Patient additionally is increasing functional cervical range of motion however is limited when not focusing on keeping shoulders depressed and retracted. Patient is to return back to work full time starting next week and will benefit form therapy following patient until fully integrated into work with minimal regressions. Patient would benefit from skilled physical therapy to reduce pain, improve muscle tissue length and posture, and return patient to PLOF    Personal Factors and Comorbidities  Age;Comorbidity 3+;Past/Current Experience;Profession;Social Background;Time since onset of injury/illness/exacerbation    Comorbidities hyperglycemia, elevated BP, arthritis, HLD, restless legs, chronic insomnia, prior vertiginous migraine, thalamic CVA, anxiety, CAD, depression, GERD, hx of cancer, IBS, lumbar disc disease (prior back surgery with fusion x2 L4-5, S1), and cervical disc disease.    Examination-Activity Limitations Bed Mobility;Bend;Caring for Others;Carry;Dressing;Sleep;Reach Overhead;Lift;Hygiene/Grooming;Stairs;Bathing;Sit    Examination-Participation Restrictions Cleaning;Community Activity;Driving;Interpersonal Relationship;Laundry;Volunteer;Meal Prep;Yard Work;Other    Stability/Clinical Decision Making Evolving/Moderate complexity    Rehab Potential Fair    PT Frequency 2x / week    PT Duration 8 weeks    PT Treatment/Interventions ADLs/Self Care Home Management;Biofeedback;Cryotherapy;Electrical Stimulation;Iontophoresis 64m/ml Dexamethasone;Moist Heat;Traction;Ultrasound;DME Instruction;Functional mobility training;Therapeutic activities;Patient/family education;Neuromuscular re-education;Therapeutic exercise;Manual techniques;Energy conservation;Dry needling;Passive range of motion;Taping;Vestibular;Vasopneumatic Device;Visual/perceptual remediation/compensation;Canalith Repostioning;Balance training    PT Next Visit Plan nerve glides, STM, posture    PT Home Exercise Plan median and ulnar modified nerve glides    Consulted and Agree with Plan of Care Patient           Patient will benefit from skilled therapeutic intervention in order to improve the following deficits and impairments:  Decreased activity tolerance, Decreased mobility, Decreased range of motion, Decreased strength, Hypomobility, Impaired flexibility, Impaired perceived functional ability, Increased muscle spasms, Postural dysfunction, Improper body mechanics, Pain, Decreased knowledge of precautions,  Decreased scar mobility  Visit Diagnosis: Cervicalgia  Abnormal posture  Radiculopathy, cervical region  Muscle weakness (generalized)     Problem List Patient Active Problem List   Diagnosis Date Noted  . PFO (patent foramen ovale)   . CAD (coronary artery disease), native coronary artery   . Acute CVA (cerebrovascular accident) ([place] 06/15/2018  . Depression 10/16/2017  . Migraine 10/16/2017  . Hyperglycemia, unspecified 07/10/2017  . Hyperglycemia 07/10/2017  . H/O dizziness 08/02/2016  .  Cervical disc disorder with radiculopathy of cervical region 04/12/2016  . Nonallopathic lesion of thoracic region 04/12/2016  . Nonallopathic lesion of rib cage 04/12/2016  . Facet arthritis of cervical region 03/21/2016  . Hyperlipidemia 01/26/2015  . Labral tear of shoulder, right, subsequent encounter 12/04/2014  . 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 Impingement syndrome of right shoulder 05/28/2014  . Right supraspinatus tenosynovitis 05/28/2014  . Arthritis pain 04/03/2014  . Fatigue 04/03/2014  . 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 IBS (irritable bowel syndrome) 06/20/2012  . Lumbar disc disease 06/20/2012   Janna Arch, PT, DPT   03/24/2020, 9:45 AM  [place] MAIN [facility] SERVICES [address] [place], [place], [address] Phone: [phone]   Fax:  [phone]  Name: TYONNA TALERICO MRN: [id number] Date of Birth: [birth year]

## 2020-03-25 DIAGNOSIS — Z6827 Body mass index (BMI) 27.0-27.9, adult: Secondary | ICD-10-CM | POA: Diagnosis not present

## 2020-03-25 DIAGNOSIS — I10 Essential (primary) hypertension: Secondary | ICD-10-CM | POA: Diagnosis not present

## 2020-03-25 DIAGNOSIS — M50123 Cervical disc disorder at C6-C7 level with radiculopathy: Secondary | ICD-10-CM | POA: Diagnosis not present

## 2020-03-26 ENCOUNTER — Ambulatory Visit: Payer: 59

## 2020-03-26 ENCOUNTER — Other Ambulatory Visit: Payer: Self-pay

## 2020-03-26 DIAGNOSIS — M542 Cervicalgia: Secondary | ICD-10-CM

## 2020-03-26 DIAGNOSIS — M6281 Muscle weakness (generalized): Secondary | ICD-10-CM | POA: Diagnosis not present

## 2020-03-26 DIAGNOSIS — R293 Abnormal posture: Secondary | ICD-10-CM

## 2020-03-26 DIAGNOSIS — M5412 Radiculopathy, cervical region: Secondary | ICD-10-CM | POA: Diagnosis not present

## 2020-03-26 NOTE — Therapy (Signed)
[place] MAIN [facility] SERVICES [address] [place], [place], [address] Phone: [phone]   Fax:  [phone]  Physical Therapy Treatment  Patient Details  Name: Lori Morales MRN: [id number] Date of Birth: [birth year] Referring Provider (PT): Dr. Earleen Newport    Encounter Date: 03/26/2020   PT End of Session - 03/26/20 1133    Visit Number 17    Number of Visits 26    Date for PT Re-Evaluation 04/28/20    Authorization Type 7/10 PN 03/03/20    PT Start Time 0932    PT Stop Time 1015    PT Time Calculation (min) 43 min    Activity Tolerance Patient tolerated treatment well   lack of protocol   Behavior During Therapy [facility] for tasks assessed/performed           Past Medical History:  Diagnosis Date  . Arthritis   . CAD (coronary artery disease), native coronary artery    mild non calcified plaque in mid LAD and prox LCx on coronary CTA 01/2019  . Depression   . GERD (gastroesophageal reflux disease)   . Headache    migraines  . Hx of squamous cell carcinoma 07/11/2016   Right distal dorsum lat forearm   . Hypertension   . IBS (irritable bowel syndrome)   . Palpitations   . PFO (patent foramen ovale)    small by cardiac CTA 01/2019  . PONV (postoperative nausea and vomiting)   . Restless legs syndrome     Past Surgical History:  Procedure Laterality Date  . ABDOMINOPLASTY    . BACK SURGERY     1 laminectomy, 1 fusion  . COLONOSCOPY    . COLONOSCOPY WITH PROPOFOL N/A 01/17/2017   Procedure: COLONOSCOPY WITH PROPOFOL;  Surgeon: Lollie Sails, MD;  Location: [facility] ENDOSCOPY;  Service: Endoscopy;  Laterality: N/A;  . WISDOM TOOTH EXTRACTION      There were no vitals filed for this visit.   Subjective Assessment - 03/26/20 0937    Subjective Patient reports she is doing alright today. She is having some tension in her upper traps bilaterally but mostly on the left side. No specific questions or concerns upon arrival. She  would like to try dry needling to see if it will help with her pain/muscle tension.    Pertinent History Patient returning to PT after C6-7 arthroplasty with spur removal and discectomy on 12/06/19.  After one week started trying to quilt and had bad pain in her neck. Ever since surgery had pain in L side and shoulder with arm to forearm; can't sleep on L side right now. Is on a steroid taper now for reduction of pain. Was not given a protocol but was told no lifting more than 10lb, no cervical extension. Reports the surgeon wants to be conservative with treatment at the beginning. PMH of hyperglycemia, elevated BP, arthritis, HLD, restless legs, chronic insomnia, prior vertiginous migraine, thalamic CVA, anxiety, CAD, depression, GERD, hx of cancer, IBS, lumbar disc disease (prior back surgery with fusion x2 L4-5, S1), and cervical disc disease. 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    Limitations Sitting;Lifting;Standing;House hold activities;Reading    How long can you sit comfortably? neck pain limits sitting position    How long can you stand comfortably? n/a    How long can you walk comfortably? 2 miles    Patient Stated Goals decrease pain,  improve ROM    Currently in Pain? No/denies   Denies pain but reports tension in upper traps bilaterally L>R   Pain Onset --             TREATMENT   Manual Therapy  Gentle passive cervical range of motion into lateral flexion and rotation bilaterally; Lateral flexion stretch x 30s bilateral; Upper trap stretch x 30s bilateral; L first rib mobilizations, grade II-III, 20s/bout x 2 bouts with some reproduction of patients pain; STM to L upper trap, L levator scapulae, and L cervical paraspinals with short duration trigger point holds; Suboccipital release x 30s;   Trigger Point Dry Needling (TDN), unbilled Education performed with patient regarding potential benefit of TDN. Reviewed  precautions and risks with patient. Reviewed special precautions/risks over lung fields which include pneumothorax. Reviewed signs and symptoms of pneumothorax and advised pt to go to ER immediately if these symptoms develop advise them of dry needling treatment. Extensive time spent with pt to ensure full understanding of TDN risks. Pt provided verbal consent to treatment. TDN performed to R upper trap with 1, 0.30 x 60 single needle placement with local twitch response (LTR). Also performed TDN to L upper trap and L levator scapulae with 3, 0.30 x 60 single needle placements. First placement in L upper trap and first placement in the L levator scapulae performed from the posterior approach with LTR in L upper trap. The final placement was in levator scapulae and was performed from anterior approach with LTR. Pistoning technique utilized as well as some longer hold placements due to tenderness. Decreased L upper trap pain with cervical motion following intervention.    Pt reports decrease in her L upper trap pain with cervical AROM following intervention. Utilized trigger point dry needling during session today to decrease muscle tension and allow for additional range of motion with stretching. Avoided cervical area but focused on bilateral upper traps as well as L levator scapulae. Additional manual techniques utilized such as STM and L first rib mobilizations. Pt does report some reproduction of her pain with L first rib mobilizations. No HEP modifications provided on this date. Pt encouraged to follow-up as scheduled. Pt will benefit from PT services to address deficits in pain in order to resume full function at home and work.                      PT Short Term Goals - 03/03/20 1416      PT SHORT TERM GOAL #1   Title Patient will be independent in home exercise program to improve strength/mobility/pain relief for better functional independence with ADLs.    Baseline 8/11; HEP given  10/5: HEP compliant    Time 4    Period Weeks    Status Achieved    Target Date 02/05/20             PT Long Term Goals - 03/03/20 1417      PT LONG TERM GOAL #1   Title Patient will report a worst pain of 3/10 on VAS in cervical spine to improve tolerance with ADLs and reduced symptoms with activities.    Baseline 8/11: 8/10 10/5: 5/10    Time 8    Period Weeks    Status Partially Met    Target Date 04/28/20      PT LONG TERM GOAL #2   Title Patient will increase FOTO score to equal to or greater than  50/100   to demonstrate  statistically significant improvement in mobility and quality of life.    Baseline 8/11: 40% 10/5: 10/5: 53%    Time 8    Period Weeks    Status Achieved      PT LONG TERM GOAL #3   Title Patient will reduce Neck Disability Index score to <10% to demonstrate minimal disability with ADL's including improved sleeping tolerance, sitting tolerance, etc for better mobility at home and work.    Baseline 8/11: 62% 10/5: 38%    Time 8    Period Weeks    Status Partially Met    Target Date 04/28/20      PT LONG TERM GOAL #4   Title Patient will increase cervical AROM within 10 degrees of functional range for improved function, ability to drive, and return to PLOF.    Baseline 8/11: extension deferred rotation: R 28 L 40, SB R 10 L 4 10/5: see note    Time 8    Period Weeks    Status Partially Met    Target Date 04/28/20                 Plan - 03/26/20 1133    Clinical Impression Statement Pt reports decrease in her L upper trap pain with cervical AROM following intervention. Utilized trigger point dry needling during session today to decrease muscle tension and allow for additional range of motion with stretching. Avoided cervical area but focused on bilateral upper traps as well as L levator scapulae. Additional manual techniques utilized such as STM and L first rib mobilizations. Pt does report some reproduction of her pain with L first rib  mobilizations. No HEP modifications provided on this date. Pt encouraged to follow-up as scheduled. Pt will benefit from PT services to address deficits in pain in order to resume full function at home and work.    Personal Factors and Comorbidities Age;Comorbidity 3+;Past/Current Experience;Profession;Social Background;Time since onset of injury/illness/exacerbation    Comorbidities hyperglycemia, elevated BP, arthritis, HLD, restless legs, chronic insomnia, prior vertiginous migraine, thalamic CVA, anxiety, CAD, depression, GERD, hx of cancer, IBS, lumbar disc disease (prior back surgery with fusion x2 L4-5, S1), and cervical disc disease.    Examination-Activity Limitations Bed Mobility;Bend;Caring for Others;Carry;Dressing;Sleep;Reach Overhead;Lift;Hygiene/Grooming;Stairs;Bathing;Sit    Examination-Participation Restrictions Cleaning;Community Activity;Driving;Interpersonal Relationship;Laundry;Volunteer;Meal Prep;Yard Work;Other    Stability/Clinical Decision Making Evolving/Moderate complexity    Rehab Potential Fair    PT Frequency 2x / week    PT Duration 8 weeks    PT Treatment/Interventions ADLs/Self Care Home Management;Biofeedback;Cryotherapy;Electrical Stimulation;Iontophoresis 21m/ml Dexamethasone;Moist Heat;Traction;Ultrasound;DME Instruction;Functional mobility training;Therapeutic activities;Patient/family education;Neuromuscular re-education;Therapeutic exercise;Manual techniques;Energy conservation;Dry needling;Passive range of motion;Taping;Vestibular;Vasopneumatic Device;Visual/perceptual remediation/compensation;Canalith Repostioning;Balance training    PT Next Visit Plan nerve glides, STM, posture    PT Home Exercise Plan median and ulnar modified nerve glides    Consulted and Agree with Plan of Care Patient           Patient will benefit from skilled therapeutic intervention in order to improve the following deficits and impairments:  Decreased activity tolerance, Decreased  mobility, Decreased range of motion, Decreased strength, Hypomobility, Impaired flexibility, Impaired perceived functional ability, Increased muscle spasms, Postural dysfunction, Improper body mechanics, Pain, Decreased knowledge of precautions, Decreased scar mobility  Visit Diagnosis: Cervicalgia  Abnormal posture     Problem List Patient Active Problem List   Diagnosis Date Noted  . PFO (patent foramen ovale)   . CAD (coronary artery disease), native coronary artery   . Acute CVA (cerebrovascular accident) ([place] 06/15/2018  . Depression 10/16/2017  .  Migraine 10/16/2017  . Hyperglycemia, unspecified 07/10/2017  . Hyperglycemia 07/10/2017  . H/O dizziness 08/02/2016  . Cervical disc disorder with radiculopathy of cervical region 04/12/2016  . Nonallopathic lesion of thoracic region 04/12/2016  . Nonallopathic lesion of rib cage 04/12/2016  . Facet arthritis of cervical region 03/21/2016  . Hyperlipidemia 01/26/2015  . Labral tear of shoulder, right, subsequent encounter 12/04/2014  . 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 Impingement syndrome of right shoulder 05/28/2014  . Right supraspinatus tenosynovitis 05/28/2014  . Arthritis pain 04/03/2014  . Fatigue 04/03/2014  . 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 IBS (irritable bowel syndrome) 06/20/2012  . Lumbar disc disease 06/20/2012   Phillips Grout PT, DPT, GCS  Lori Morales 03/26/2020, 2:22 PM  [place] MAIN [facility] SERVICES [address] [place], [place], [address] Phone: [phone]   Fax:  [phone]  Name: Lori Morales MRN: [id number] Date of Birth: 08-01-1960

## 2020-03-30 ENCOUNTER — Other Ambulatory Visit: Payer: Self-pay

## 2020-03-30 ENCOUNTER — Ambulatory Visit: Payer: 59 | Attending: Internal Medicine

## 2020-03-30 DIAGNOSIS — R293 Abnormal posture: Secondary | ICD-10-CM | POA: Diagnosis not present

## 2020-03-30 DIAGNOSIS — M5412 Radiculopathy, cervical region: Secondary | ICD-10-CM | POA: Diagnosis not present

## 2020-03-30 DIAGNOSIS — M542 Cervicalgia: Secondary | ICD-10-CM | POA: Diagnosis not present

## 2020-03-30 DIAGNOSIS — M6281 Muscle weakness (generalized): Secondary | ICD-10-CM | POA: Diagnosis not present

## 2020-03-30 NOTE — Therapy (Signed)
[place] MAIN [facility] SERVICES [address] [place], [place], [address] Phone: [phone]   Fax:  [phone]  Physical Therapy Treatment  Patient Details  Name: Lori Morales MRN: [id number] Date of Birth: 02-Oct-1960 Referring Provider (PT): Dr. Earleen Newport    Encounter Date: 03/30/2020   PT End of Session - 03/30/20 0856    Visit Number 18    Number of Visits 26    Date for PT Re-Evaluation 04/28/20    Authorization Type 8/10 PN 03/03/20    PT Start Time 0844    PT Stop Time 0930    PT Time Calculation (min) 46 min    Activity Tolerance Patient tolerated treatment well   lack of protocol   Behavior During Therapy [facility] for tasks assessed/performed           Past Medical History:  Diagnosis Date  . Arthritis   . CAD (coronary artery disease), native coronary artery    mild non calcified plaque in mid LAD and prox LCx on coronary CTA 01/2019  . Depression   . GERD (gastroesophageal reflux disease)   . Headache    migraines  . Hx of squamous cell carcinoma 07/11/2016   Right distal dorsum lat forearm   . Hypertension   . IBS (irritable bowel syndrome)   . Palpitations   . PFO (patent foramen ovale)    small by cardiac CTA 01/2019  . PONV (postoperative nausea and vomiting)   . Restless legs syndrome     Past Surgical History:  Procedure Laterality Date  . ABDOMINOPLASTY    . BACK SURGERY     1 laminectomy, 1 fusion  . COLONOSCOPY    . COLONOSCOPY WITH PROPOFOL N/A 01/17/2017   Procedure: COLONOSCOPY WITH PROPOFOL;  Surgeon: Lollie Sails, MD;  Location: [facility] ENDOSCOPY;  Service: Endoscopy;  Laterality: N/A;  . WISDOM TOOTH EXTRACTION      There were no vitals filed for this visit.   Subjective Assessment - 03/30/20 0854    Subjective Patient reports she was sore after dry needling but no lingering bad effects. Feels slightly looser. Is starting go back to full days this week.    Pertinent History Patient  returning to PT after C6-7 arthroplasty with spur removal and discectomy on 12/06/19.  After one week started trying to quilt and had bad pain in her neck. Ever since surgery had pain in L side and shoulder with arm to forearm; can't sleep on L side right now. Is on a steroid taper now for reduction of pain. Was not given a protocol but was told no lifting more than 10lb, no cervical extension. Reports the surgeon wants to be conservative with treatment at the beginning. PMH of hyperglycemia, elevated BP, arthritis, HLD, restless legs, chronic insomnia, prior vertiginous migraine, thalamic CVA, anxiety, CAD, depression, GERD, hx of cancer, IBS, lumbar disc disease (prior back surgery with fusion x2 L4-5, S1), and cervical disc disease. 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    Limitations Sitting;Lifting;Standing;House hold activities;Reading    How long can you sit comfortably? neck pain limits sitting position    How long can you stand comfortably? n/a    How long can you walk comfortably? 2 miles    Patient Stated Goals decrease pain, improve ROM    Currently in Pain? No/denies  supine: use of heat pad under upper thoracic lower cervical region Cervical sidebend with gentle overpressure 3x30 second holds each direction  cervical rotation with gentle overpressure 3x 30 second holds each direction Suboccipital release 3x30 second holds  Cervical AROM extension 10x 3 second hold ; use of rolled up towel under neck for pain reduction; added change of robber stretch  LUE AP 10x 3 second pulses Median nerve glides 15x each UE (more limited on the L), ulnar nerve glide 15x each UE  chin tucks 15x 5 second holds with scapular retraction   RTB ER 15x       Prone:  Grade II-III mobilizations to thoracic spine with reduced hypomobility with repetition 4x 10 seconds each level J mobilization to upper thoracic/lower cervical  for reduced thoracic kyphosis. 3x30 seconds  Inferio-medial rib glides L grade II-III 6x 10 seconds in varying planes of motion for reduction of tension and improve posture; hypomobile and tender initially in rib 6 and 7.   STM to upper trap, levator scap, cervical paraspinals x8 minutes with implementation of effleurage and ptrissage for muscle tissue tension reduction.     Seated: Scapular retraction with overpressure into depression  10x 10 second holds LUE Bilateral arm abduction with external rotation into overhead raise stretch , breathe out and return back to center x 8 RTB abduction 15x LUE French press pectoral stretch 2x 30 second holds Ulnar nerve glide 15x    Pt educated throughout session about proper posture and technique with exercises. Improved exercise technique, movement at target joints, use of target muscles after min to mod verbal, visual, tactile cues.                       PT Education - 03/30/20 0855    Education Details exercise technique, body mechanics, manual    Person(s) Educated Patient    Methods Explanation;Demonstration;Tactile cues;Verbal cues    Comprehension Verbalized understanding;Returned demonstration;Verbal cues required;Tactile cues required            PT Short Term Goals - 03/03/20 1416      PT SHORT TERM GOAL #1   Title Patient will be independent in home exercise program to improve strength/mobility/pain relief for better functional independence with ADLs.    Baseline 8/11; HEP given 10/5: HEP compliant    Time 4    Period Weeks    Status Achieved    Target Date 02/05/20             PT Long Term Goals - 03/03/20 1417      PT LONG TERM GOAL #1   Title Patient will report a worst pain of 3/10 on VAS in cervical spine to improve tolerance with ADLs and reduced symptoms with activities.    Baseline 8/11: 8/10 10/5: 5/10    Time 8    Period Weeks    Status Partially Met    Target Date 04/28/20      PT LONG  TERM GOAL #2   Title Patient will increase FOTO score to equal to or greater than  50/100   to demonstrate statistically significant improvement in mobility and quality of life.    Baseline 8/11: 40% 10/5: 10/5: 53%    Time 8    Period Weeks    Status Achieved      PT LONG TERM GOAL #3   Title Patient will reduce Neck Disability Index score to <10% to demonstrate minimal disability with ADL's including improved sleeping tolerance,  sitting tolerance, etc for better mobility at home and work.    Baseline 8/11: 62% 10/5: 38%    Time 8    Period Weeks    Status Partially Met    Target Date 04/28/20      PT LONG TERM GOAL #4   Title Patient will increase cervical AROM within 10 degrees of functional range for improved function, ability to drive, and return to PLOF.    Baseline 8/11: extension deferred rotation: R 28 L 40, SB R 10 L 4 10/5: see note    Time 8    Period Weeks    Status Partially Met    Target Date 04/28/20                 Plan - 03/30/20 0941    Clinical Impression Statement Patient progressing with tolerating prolonged holds of cervical postural strengthening interventions. Multiple muscle adhesions attaching to scapula reduced with STM and postural corrections. Patient additionally educated on utilizing a timer every hour to correct posture and stretch due to returning back to work full time this week. Patient would benefit from skilled physical therapy to reduce pain, improve muscle tissue length and posture, and return patient to PLOF    Personal Factors and Comorbidities Age;Comorbidity 3+;Past/Current Experience;Profession;Social Background;Time since onset of injury/illness/exacerbation    Comorbidities hyperglycemia, elevated BP, arthritis, HLD, restless legs, chronic insomnia, prior vertiginous migraine, thalamic CVA, anxiety, CAD, depression, GERD, hx of cancer, IBS, lumbar disc disease (prior back surgery with fusion x2 L4-5, S1), and cervical disc disease.     Examination-Activity Limitations Bed Mobility;Bend;Caring for Others;Carry;Dressing;Sleep;Reach Overhead;Lift;Hygiene/Grooming;Stairs;Bathing;Sit    Examination-Participation Restrictions Cleaning;Community Activity;Driving;Interpersonal Relationship;Laundry;Volunteer;Meal Prep;Yard Work;Other    Stability/Clinical Decision Making Evolving/Moderate complexity    Rehab Potential Fair    PT Frequency 2x / week    PT Duration 8 weeks    PT Treatment/Interventions ADLs/Self Care Home Management;Biofeedback;Cryotherapy;Electrical Stimulation;Iontophoresis 26m/ml Dexamethasone;Moist Heat;Traction;Ultrasound;DME Instruction;Functional mobility training;Therapeutic activities;Patient/family education;Neuromuscular re-education;Therapeutic exercise;Manual techniques;Energy conservation;Dry needling;Passive range of motion;Taping;Vestibular;Vasopneumatic Device;Visual/perceptual remediation/compensation;Canalith Repostioning;Balance training    PT Next Visit Plan nerve glides, STM, posture    PT Home Exercise Plan median and ulnar modified nerve glides    Consulted and Agree with Plan of Care Patient           Patient will benefit from skilled therapeutic intervention in order to improve the following deficits and impairments:  Decreased activity tolerance, Decreased mobility, Decreased range of motion, Decreased strength, Hypomobility, Impaired flexibility, Impaired perceived functional ability, Increased muscle spasms, Postural dysfunction, Improper body mechanics, Pain, Decreased knowledge of precautions, Decreased scar mobility  Visit Diagnosis: Cervicalgia  Radiculopathy, cervical region  Abnormal posture  Muscle weakness (generalized)     Problem List Patient Active Problem List   Diagnosis Date Noted  . PFO (patent foramen ovale)   . CAD (coronary artery disease), native coronary artery   . Acute CVA (cerebrovascular accident) ([place] 06/15/2018  . Depression 10/16/2017  . Migraine  10/16/2017  . Hyperglycemia, unspecified 07/10/2017  . Hyperglycemia 07/10/2017  . H/O dizziness 08/02/2016  . Cervical disc disorder with radiculopathy of cervical region 04/12/2016  . Nonallopathic lesion of thoracic region 04/12/2016  . Nonallopathic lesion of rib cage 04/12/2016  . Facet arthritis of cervical region 03/21/2016  . Hyperlipidemia 01/26/2015  . Labral tear of shoulder, right, subsequent encounter 12/04/2014  . 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 Impingement syndrome of right shoulder 05/28/2014  . Right supraspinatus tenosynovitis 05/28/2014  . Arthritis pain 04/03/2014  . Fatigue 04/03/2014  . Cervical intraepithelial  neoplasia grade 1 06/20/2012  . IBS (irritable bowel syndrome) 06/20/2012  . Lumbar disc disease 06/20/2012   Janna Arch, PT, DPT   03/30/2020, 9:42 AM  [place] MAIN [facility] SERVICES [address] [place], [place], [address] Phone: [phone]   Fax:  [phone]  Name: Lori Morales MRN: [id number] Date of Birth: 07/15/1960

## 2020-04-01 ENCOUNTER — Ambulatory Visit: Payer: 59

## 2020-04-02 ENCOUNTER — Ambulatory Visit: Payer: 59

## 2020-04-02 ENCOUNTER — Other Ambulatory Visit: Payer: Self-pay

## 2020-04-02 DIAGNOSIS — M6281 Muscle weakness (generalized): Secondary | ICD-10-CM | POA: Diagnosis not present

## 2020-04-02 DIAGNOSIS — M542 Cervicalgia: Secondary | ICD-10-CM

## 2020-04-02 DIAGNOSIS — R293 Abnormal posture: Secondary | ICD-10-CM | POA: Diagnosis not present

## 2020-04-02 DIAGNOSIS — M5412 Radiculopathy, cervical region: Secondary | ICD-10-CM | POA: Diagnosis not present

## 2020-04-02 NOTE — Therapy (Signed)
[place] MAIN [facility] SERVICES [address] [place], [place], [address] Phone: [phone]   Fax:  [phone]  Physical Therapy Treatment  Patient Details  Name: Lori Morales MRN: [id number] Date of Birth: 1961/01/11 Referring Provider (PT): Dr. Earleen Newport    Encounter Date: 04/02/2020   PT End of Session - 04/03/20 1042    Visit Number 19    Number of Visits 26    Date for PT Re-Evaluation 04/28/20    Authorization Type 8/10 PN 03/03/20    PT Start Time 1303    PT Stop Time 1350    PT Time Calculation (min) 47 min    Activity Tolerance Patient tolerated treatment well   lack of protocol   Behavior During Therapy [facility] for tasks assessed/performed           Past Medical History:  Diagnosis Date  . Arthritis   . CAD (coronary artery disease), native coronary artery    mild non calcified plaque in mid LAD and prox LCx on coronary CTA 01/2019  . Depression   . GERD (gastroesophageal reflux disease)   . Headache    migraines  . Hx of squamous cell carcinoma 07/11/2016   Right distal dorsum lat forearm   . Hypertension   . IBS (irritable bowel syndrome)   . Palpitations   . PFO (patent foramen ovale)    small by cardiac CTA 01/2019  . PONV (postoperative nausea and vomiting)   . Restless legs syndrome     Past Surgical History:  Procedure Laterality Date  . ABDOMINOPLASTY    . BACK SURGERY     1 laminectomy, 1 fusion  . COLONOSCOPY    . COLONOSCOPY WITH PROPOFOL N/A 01/17/2017   Procedure: COLONOSCOPY WITH PROPOFOL;  Surgeon: Lollie Sails, MD;  Location: [facility] ENDOSCOPY;  Service: Endoscopy;  Laterality: N/A;  . WISDOM TOOTH EXTRACTION      There were no vitals filed for this visit.   Subjective Assessment - 04/03/20 1040    Subjective Patient reports she was sore after dry needling but it did seem to help.  She reports improvement in her her left upper trap pain following needling.  She continues to have a lot of  pain in her left mid thoracic back near rhomboid major/minor.    Pertinent History Patient returning to PT after C6-7 arthroplasty with spur removal and discectomy on 12/06/19.  After one week started trying to quilt and had bad pain in her neck. Ever since surgery had pain in L side and shoulder with arm to forearm; can't sleep on L side right now. Is on a steroid taper now for reduction of pain. Was not given a protocol but was told no lifting more than 10lb, no cervical extension. Reports the surgeon wants to be conservative with treatment at the beginning. PMH of hyperglycemia, elevated BP, arthritis, HLD, restless legs, chronic insomnia, prior vertiginous migraine, thalamic CVA, anxiety, CAD, depression, GERD, hx of cancer, IBS, lumbar disc disease (prior back surgery with fusion x2 L4-5, S1), and cervical disc disease. 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    Limitations Sitting;Lifting;Standing;House hold activities;Reading    How long can you sit comfortably? neck pain limits sitting position    How long can you stand comfortably? n/a    How long can you walk comfortably? 2 miles    Patient Stated Goals decrease pain, improve ROM  Currently in Pain? Yes    Pain Location Neck    Pain Orientation Lower    Pain Descriptors / Indicators Aching    Pain Type Chronic pain    Pain Onset More than a month ago    Pain Frequency Intermittent              TREATMENT   Manual Therapy  CPA C2-C4, grade II, 30s/bout x 1 bout/level; CPA T2-T7, grade III, 30s/bout x 2 bouts/level; STM to L upper trap, L levator scapulae, L cervical paraspinals, and L rhomboid major/minor with long duration trigger point release especially in rhomboid major where pt has a significantly painful trigger point;   Trigger Point Dry Needling (TDN), unbilled Education performed with patient regarding potential benefit of TDN. Pt provided verbal consent to  treatment. TDN performed to L upper trap, L levator scapulae, and L suboccipitals with 1, 0.25 x 40 single needle placement in each muscle. Local twitch response (LTR) in L upper trap and deep ache in levator and suboccipitals. Also performed TDN to L rhomboid major/minor with 3, 0.30 x 60 single needle placements. Deep ache during two placements and one significant twitch response in rhomboid major. Pistoning technique utilized.   Pt reports significant improvement in her pain following trigger point dry needling and manual techniques. Especially significant relief with needling to rhomboid major as well as trigger point release.  Additional manual techniques utilized such as STM and cervical/thoracic mobilizations.  Patient will need progress note at next visit.  No HEP modifications provided on this date. Pt encouraged to follow-up as scheduled. Pt will benefit from PT services to address deficits in pain in order to resume full function at home and work.                                PT Short Term Goals - 03/03/20 1416      PT SHORT TERM GOAL #1   Title Patient will be independent in home exercise program to improve strength/mobility/pain relief for better functional independence with ADLs.    Baseline 8/11; HEP given 10/5: HEP compliant    Time 4    Period Weeks    Status Achieved    Target Date 02/05/20             PT Long Term Goals - 03/03/20 1417      PT LONG TERM GOAL #1   Title Patient will report a worst pain of 3/10 on VAS in cervical spine to improve tolerance with ADLs and reduced symptoms with activities.    Baseline 8/11: 8/10 10/5: 5/10    Time 8    Period Weeks    Status Partially Met    Target Date 04/28/20      PT LONG TERM GOAL #2   Title Patient will increase FOTO score to equal to or greater than  50/100   to demonstrate statistically significant improvement in mobility and quality of life.    Baseline 8/11: 40% 10/5: 10/5: 53%     Time 8    Period Weeks    Status Achieved      PT LONG TERM GOAL #3   Title Patient will reduce Neck Disability Index score to <10% to demonstrate minimal disability with ADL's including improved sleeping tolerance, sitting tolerance, etc for better mobility at home and work.    Baseline 8/11: 62% 10/5: 38%    Time 8  Period Weeks    Status Partially Met    Target Date 04/28/20      PT LONG TERM GOAL #4   Title Patient will increase cervical AROM within 10 degrees of functional range for improved function, ability to drive, and return to PLOF.    Baseline 8/11: extension deferred rotation: R 28 L 40, SB R 10 L 4 10/5: see note    Time 8    Period Weeks    Status Partially Met    Target Date 04/28/20                 Plan - 04/03/20 1042    Clinical Impression Statement Pt reports significant improvement in her pain following trigger point dry needling and manual techniques. Especially significant relief with needling to rhomboid major as well as trigger point release.  Additional manual techniques utilized such as STM and cervical/thoracic mobilizations.  Patient will need progress note at next visit.  No HEP modifications provided on this date. Pt encouraged to follow-up as scheduled. Pt will benefit from PT services to address deficits in pain in order to resume full function at home and work.    Personal Factors and Comorbidities Age;Comorbidity 3+;Past/Current Experience;Profession;Social Background;Time since onset of injury/illness/exacerbation    Comorbidities hyperglycemia, elevated BP, arthritis, HLD, restless legs, chronic insomnia, prior vertiginous migraine, thalamic CVA, anxiety, CAD, depression, GERD, hx of cancer, IBS, lumbar disc disease (prior back surgery with fusion x2 L4-5, S1), and cervical disc disease.    Examination-Activity Limitations Bed Mobility;Bend;Caring for Others;Carry;Dressing;Sleep;Reach Overhead;Lift;Hygiene/Grooming;Stairs;Bathing;Sit     Examination-Participation Restrictions Cleaning;Community Activity;Driving;Interpersonal Relationship;Laundry;Volunteer;Meal Prep;Yard Work;Other    Stability/Clinical Decision Making Evolving/Moderate complexity    Rehab Potential Fair    PT Frequency 2x / week    PT Duration 8 weeks    PT Treatment/Interventions ADLs/Self Care Home Management;Biofeedback;Cryotherapy;Electrical Stimulation;Iontophoresis 47m/ml Dexamethasone;Moist Heat;Traction;Ultrasound;DME Instruction;Functional mobility training;Therapeutic activities;Patient/family education;Neuromuscular re-education;Therapeutic exercise;Manual techniques;Energy conservation;Dry needling;Passive range of motion;Taping;Vestibular;Vasopneumatic Device;Visual/perceptual remediation/compensation;Canalith Repostioning;Balance training    PT Next Visit Plan nerve glides, STM, posture    PT Home Exercise Plan median and ulnar modified nerve glides    Consulted and Agree with Plan of Care Patient           Patient will benefit from skilled therapeutic intervention in order to improve the following deficits and impairments:  Decreased activity tolerance, Decreased mobility, Decreased range of motion, Decreased strength, Hypomobility, Impaired flexibility, Impaired perceived functional ability, Increased muscle spasms, Postural dysfunction, Improper body mechanics, Pain, Decreased knowledge of precautions, Decreased scar mobility  Visit Diagnosis: Cervicalgia     Problem List Patient Active Problem List   Diagnosis Date Noted  . PFO (patent foramen ovale)   . CAD (coronary artery disease), native coronary artery   . Acute CVA (cerebrovascular accident) ([facility] 06/15/2018  . Depression 10/16/2017  . Migraine 10/16/2017  . Hyperglycemia, unspecified 07/10/2017  . Hyperglycemia 07/10/2017  . H/O dizziness 08/02/2016  . Cervical disc disorder with radiculopathy of cervical region 04/12/2016  . Nonallopathic lesion of thoracic region  04/12/2016  . Nonallopathic lesion of rib cage 04/12/2016  . Facet arthritis of cervical region 03/21/2016  . Hyperlipidemia 01/26/2015  . Labral tear of shoulder, right, subsequent encounter 12/04/2014  . 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 Impingement syndrome of right shoulder 05/28/2014  . Right supraspinatus tenosynovitis 05/28/2014  . Arthritis pain 04/03/2014  . Fatigue 04/03/2014  . 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 IBS (irritable bowel syndrome) 06/20/2012  . Lumbar disc disease 06/20/2012   JLyndel Safe  Raheim Beutler PT, DPT, GCS  Rodolph Hagemann 04/03/2020, 10:48 AM  [place] MAIN [facility] SERVICES [address] [place], [place], [address] Phone: [phone]   Fax:  [phone]  Name: Lori Morales MRN: [id number] Date of Birth: Apr 25, 1961

## 2020-04-06 ENCOUNTER — Ambulatory Visit: Payer: 59

## 2020-04-06 ENCOUNTER — Other Ambulatory Visit: Payer: Self-pay

## 2020-04-06 DIAGNOSIS — M5412 Radiculopathy, cervical region: Secondary | ICD-10-CM | POA: Diagnosis not present

## 2020-04-06 DIAGNOSIS — M6281 Muscle weakness (generalized): Secondary | ICD-10-CM | POA: Diagnosis not present

## 2020-04-06 DIAGNOSIS — M542 Cervicalgia: Secondary | ICD-10-CM | POA: Diagnosis not present

## 2020-04-06 DIAGNOSIS — R293 Abnormal posture: Secondary | ICD-10-CM

## 2020-04-06 NOTE — Therapy (Signed)
[place] MAIN [facility] SERVICES [address] [place], [place], [address] Phone: [phone]   Fax:  [phone]  Physical Therapy Treatment Physical Therapy Progress Note   Dates of reporting period  03/03/20  to   04/06/20  Patient Details  Name: Lori Morales MRN: [id number] Date of Birth: 06-21-1960 Referring Provider (PT): Dr. Earleen Newport    Encounter Date: 04/06/2020    Past Medical History:  Diagnosis Date  . Arthritis   . CAD (coronary artery disease), native coronary artery    mild non calcified plaque in mid LAD and prox LCx on coronary CTA 01/2019  . Depression   . GERD (gastroesophageal reflux disease)   . Headache    migraines  . Hx of squamous cell carcinoma 07/11/2016   Right distal dorsum lat forearm   . Hypertension   . IBS (irritable bowel syndrome)   . Palpitations   . PFO (patent foramen ovale)    small by cardiac CTA 01/2019  . PONV (postoperative nausea and vomiting)   . Restless legs syndrome     Past Surgical History:  Procedure Laterality Date  . ABDOMINOPLASTY    . BACK SURGERY     1 laminectomy, 1 fusion  . COLONOSCOPY    . COLONOSCOPY WITH PROPOFOL N/A 01/17/2017   Procedure: COLONOSCOPY WITH PROPOFOL;  Surgeon: Lollie Sails, MD;  Location: [facility] END[facility]COPY;  Service: Endoscopy;  Laterality: N/A;  . WISDOM TOOTH EXTRACTION      There were no vitals filed for this visit.                Goals: VAS: 4/10  FOTO: 55% Cervical AROM NDI:40%   supine: use of heat pad under upper thoracic lower cervical region Cervical sidebend with gentle overpressure 3x30 second holds each direction  cervical rotation with gentle overpressure 3x 30 second holds each direction Suboccipital release 3x30 second holds  Cervical AROM extension 10x 3 second hold ; use of rolled up towel under neck for pain reduction; added change of robber stretch  LUE AP 10x 3 second pulses Median nerve glides 15x each  UE (more limited on the L), ulnar nerve glide 15x each UE  chin tucks 15x 5 second holds with scapular retraction   snow angel abduction 10x      Prone:  Grade II-III mobilizations to thoracic spine with reduced hypomobility with repetition 4x 10 seconds each level J mobilization to upper thoracic/lower cervical for reduced thoracic kyphosis. 3x30 seconds  Inferio-medial rib glides L grade II-III 6x 10 seconds in varying planes of motion for reduction of tension and improve posture; hypomobile and tender initially in rib 6 and 7.   STM to upper trap, levator scap, cervical paraspinals x8 minutes with implementation of effleurage and ptrissage for muscle tissue tension reduction.    Sidelying:  3lb dumbbell ER 15x LUE   Seated: 3lb dumbell ER 15x  Bilateral arm abduction with external rotation into overhead raise stretch , breathe out and return back to center x 8 [place] press pectoral stretch 2x 30 second holds      Pt educated throughout session about proper posture and technique with exercises. Improved exercise technique, movement at target joints, use of target muscles after min to mod verbal, visual, tactile cues.        Patient's condition has the potential to improve in response to therapy. Maximum improvement is yet to be obtained. The anticipated improvement is attainable and reasonable in a generally  predictable time.  Patient reports her tightness of cervical and thoracic region is improving however continues to have soreness/discomfort at C5 region/middle delt as well as limited ability to keep upright posture without fatigue.             PT Short Term Goals - 03/03/20 1416      PT SHORT TERM GOAL #1   Title Patient will be independent in home exercise program to improve strength/mobility/pain relief for better functional independence with ADLs.    Baseline 8/11; HEP given 10/5: HEP compliant    Time 4    Period Weeks    Status Achieved    Target Date  02/05/20             PT Long Term Goals - 03/03/20 1417      PT LONG TERM GOAL #1   Title Patient will report a worst pain of 3/10 on VAS in cervical spine to improve tolerance with ADLs and reduced symptoms with activities.    Baseline 8/11: 8/10 10/5: 5/10    Time 8    Period Weeks    Status Partially Met    Target Date 04/28/20      PT LONG TERM GOAL #2   Title Patient will increase FOTO score to equal to or greater than  50/100   to demonstrate statistically significant improvement in mobility and quality of life.    Baseline 8/11: 40% 10/5: 10/5: 53%    Time 8    Period Weeks    Status Achieved      PT LONG TERM GOAL #3   Title Patient will reduce Neck Disability Index score to <10% to demonstrate minimal disability with ADL's including improved sleeping tolerance, sitting tolerance, etc for better mobility at home and work.    Baseline 8/11: 62% 10/5: 38%    Time 8    Period Weeks    Status Partially Met    Target Date 04/28/20      PT LONG TERM GOAL #4   Title Patient will increase cervical AROM within 10 degrees of functional range for improved function, ability to drive, and return to PLOF.    Baseline 8/11: extension deferred rotation: R 28 L 40, SB R 10 L 4 10/5: see note    Time 8    Period Weeks    Status Partially Met    Target Date 04/28/20                  Patient will benefit from skilled therapeutic intervention in order to improve the following deficits and impairments:     Visit Diagnosis: No diagnosis found.     Problem List Patient Active Problem List   Diagnosis Date Noted  . PFO (patent foramen ovale)   . CAD (coronary artery disease), native coronary artery   . Acute CVA (cerebrovascular accident) ([place]) 06/15/2018  . Depression 10/16/2017  . Migraine 10/16/2017  . Hyperglycemia, unspecified 07/10/2017  . Hyperglycemia 07/10/2017  . H/O dizziness 08/02/2016  . Cervical disc disorder with radiculopathy of cervical region  04/12/2016  . Nonallopathic lesion of thoracic region 04/12/2016  . Nonallopathic lesion of rib cage 04/12/2016  . Facet arthritis of cervical region 03/21/2016  . Hyperlipidemia 01/26/2015  . Labral tear of shoulder, right, subsequent encounter 12/04/2014  . 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 Impingement syndrome of right shoulder 05/28/2014  . Right supraspinatus tenosynovitis 05/28/2014  . Arthritis pain 04/03/2014  . Fatigue 04/03/2014  . Cervical intraepithelial neoplasia grade 1  06/20/2012  . IBS (irritable bowel syndrome) 06/20/2012  . Lumbar disc disease 06/20/2012   Janna Arch, PT, DPT   04/06/2020, 8:47 AM  [place] MAIN [facility] SERVICES [address] [place], [place], [address] Phone: [phone]   Fax:  [phone]  Name: Lori Morales MRN: [id number] Date of Birth: 11/18/1960

## 2020-04-08 ENCOUNTER — Other Ambulatory Visit: Payer: Self-pay

## 2020-04-08 ENCOUNTER — Ambulatory Visit: Payer: 59

## 2020-04-08 DIAGNOSIS — M6281 Muscle weakness (generalized): Secondary | ICD-10-CM | POA: Diagnosis not present

## 2020-04-08 DIAGNOSIS — M542 Cervicalgia: Secondary | ICD-10-CM | POA: Diagnosis not present

## 2020-04-08 DIAGNOSIS — R293 Abnormal posture: Secondary | ICD-10-CM | POA: Diagnosis not present

## 2020-04-08 DIAGNOSIS — M5412 Radiculopathy, cervical region: Secondary | ICD-10-CM | POA: Diagnosis not present

## 2020-04-08 NOTE — Therapy (Signed)
[place] MAIN [facility] SERVICES [address] [place], [place], [address] Phone: [phone]   Fax:  [phone]  Physical Therapy Treatment  Patient Details  Name: Lori Morales MRN: [id number] Date of Birth: [birth year] Referring Provider (PT): Dr. Earleen Newport    Encounter Date: 04/08/2020   PT End of Session - 04/08/20 1433    Visit Number 21    Number of Visits 26    Date for PT Re-Evaluation 04/28/20    Authorization Type 10/10 PN 03/03/20; next session 1/10 PN 11/8    PT Start Time 1435    PT Stop Time 1515    PT Time Calculation (min) 40 min    Activity Tolerance Patient tolerated treatment well   lack of protocol   Behavior During Therapy [facility] for tasks assessed/performed           Past Medical History:  Diagnosis Date  . Arthritis   . CAD (coronary artery disease), native coronary artery    mild non calcified plaque in mid LAD and prox LCx on coronary CTA 01/2019  . Depression   . GERD (gastroesophageal reflux disease)   . Headache    migraines  . Hx of squamous cell carcinoma 07/11/2016   Right distal dorsum lat forearm   . Hypertension   . IBS (irritable bowel syndrome)   . Palpitations   . PFO (patent foramen ovale)    small by cardiac CTA 01/2019  . PONV (postoperative nausea and vomiting)   . Restless legs syndrome     Past Surgical History:  Procedure Laterality Date  . ABDOMINOPLASTY    . BACK SURGERY     1 laminectomy, 1 fusion  . COLONOSCOPY    . COLONOSCOPY WITH PROPOFOL N/A 01/17/2017   Procedure: COLONOSCOPY WITH PROPOFOL;  Surgeon: Lollie Sails, MD;  Location: [facility] ENDOSCOPY;  Service: Endoscopy;  Laterality: N/A;  . WISDOM TOOTH EXTRACTION      There were no vitals filed for this visit.   Subjective Assessment - 04/08/20 1446    Subjective Patient reports significant improvement in her L shoulder pain after her last session of dry needling. She had some mild soreness afterwward but no more  than following her first session of dry needling. She has been having some shoulder tightness.  of L shoulder soreness since last therapy session. She denies of any symptoms along her LUE and is more local to her L upper traps and deltoid region. Patient reports she is compliant with her HEP.    Pertinent History Patient returning to PT after C6-7 arthroplasty with spur removal and discectomy on 12/06/19.  After one week started trying to quilt and had bad pain in her neck. Ever since surgery had pain in L side and shoulder with arm to forearm; can't sleep on L side right now. Is on a steroid taper now for reduction of pain. Was not given a protocol but was told no lifting more than 10lb, no cervical extension. Reports the surgeon wants to be conservative with treatment at the beginning. PMH of hyperglycemia, elevated BP, arthritis, HLD, restless legs, chronic insomnia, prior vertiginous migraine, thalamic CVA, anxiety, CAD, depression, GERD, hx of cancer, IBS, lumbar disc disease (prior back surgery with fusion x2 L4-5, S1), and cervical disc disease. 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    Limitations Sitting;Lifting;Standing;House hold activities;Reading    How long can you sit  comfortably? neck pain limits sitting position    How long can you stand comfortably? n/a    How long can you walk comfortably? 2 miles    Patient Stated Goals decrease pain, improve ROM    Currently in Pain? Yes    Pain Score 3     Pain Location Neck    Pain Descriptors / Indicators Aching;Sore    Pain Onset More than a month ago              TREATMENT   Manual Therapy  CPA C2-C4, grade II, 30s/bout x 2 bouts/level; CPA and L UPA T3-T7, grade III, 30s/bout x 2 bouts/level each; STM to L upper trap, L levator scapulae, L cervical paraspinals, L suboccipitals and L rhomboid major/minor with long duration trigger point release especially in rhomboid major  where pt has a significantly painful trigger point;   Trigger Point Dry Needling (TDN), unbilled Education performed with patient regarding potential benefit of TDN. Pt provided verbal consent to treatment. TDN performed to L upper trap, L levator scapulae, and L suboccipitals with 1, 0.25 x 40 single needle placement in each muscle. Local twitch response (LTR) in L upper trap and deep ache in levator and suboccipitals. Also performed TDN to L rhomboid major/minor/subscapularis with 1, 0.30 x 60 single needle placement. Deep ache in rhomboid major. Pistoning technique utilized as well as longer duration placements during significant twitching.   Pt reports significant improvement in her pain following trigger point dry needling and manual techniques at last session. Especially significant relief with needling to rhomboid major as well as trigger point release.  Pt reports significant relief today as well. Additional manual techniques utilized such as STM and cervical/thoracic mobilizations.  No HEP modifications provided on this date. Pt encouraged to follow-up as scheduled. Pt will benefit from PT services to address deficits in pain in order to resume full function at home and work.                                             PT Short Term Goals - 03/03/20 1416      PT SHORT TERM GOAL #1   Title Patient will be independent in home exercise program to improve strength/mobility/pain relief for better functional independence with ADLs.    Baseline 8/11; HEP given 10/5: HEP compliant    Time 4    Period Weeks    Status Achieved    Target Date 02/05/20             PT Long Term Goals - 04/06/20 0942      PT LONG TERM GOAL #1   Title Patient will report a worst pain of 3/10 on VAS in cervical spine to improve tolerance with ADLs and reduced symptoms with activities.    Baseline 8/11: 8/10 10/5: 5/10 11/8: 4/10    Time 8    Period Weeks    Status  Partially Met    Target Date 04/28/20      PT LONG TERM GOAL #2   Title Patient will increase FOTO score to equal to or greater than  50/100   to demonstrate statistically significant improvement in mobility and quality of life.    Baseline 8/11: 40% 10/5: 10/5: 53% 11/8: 55%    Time 8    Period Weeks    Status Achieved  PT LONG TERM GOAL #3   Title Patient will reduce Neck Disability Index score to <10% to demonstrate minimal disability with ADL's including improved sleeping tolerance, sitting tolerance, etc for better mobility at home and work.    Baseline 8/11: 62% 10/5: 38% 11/8: 40%    Time 8    Period Weeks    Status Partially Met    Target Date 04/28/20      PT LONG TERM GOAL #4   Title Patient will increase cervical AROM within 10 degrees of functional range for improved function, ability to drive, and return to PLOF.    Baseline 8/11: extension deferred rotation: R 28 L 40, SB R 10 L 4 10/5: see note    Time 8    Period Weeks    Status Partially Met    Target Date 04/28/20                 Plan - 04/08/20 1620    Clinical Impression Statement Pt reports significant improvement in her pain following trigger point dry needling and manual techniques at last session. Especially significant relief with needling to rhomboid major as well as trigger point release.  Pt reports significant relief today as well. Additional manual techniques utilized such as STM and cervical/thoracic mobilizations.  No HEP modifications provided on this date. Pt encouraged to follow-up as scheduled. Pt will benefit from PT services to address deficits in pain in order to resume full function at home and work.    Personal Factors and Comorbidities Age;Comorbidity 3+;Past/Current Experience;Profession;Social Background;Time since onset of injury/illness/exacerbation    Comorbidities hyperglycemia, elevated BP, arthritis, HLD, restless legs, chronic insomnia, prior vertiginous migraine, thalamic  CVA, anxiety, CAD, depression, GERD, hx of cancer, IBS, lumbar disc disease (prior back surgery with fusion x2 L4-5, S1), and cervical disc disease.    Examination-Activity Limitations Bed Mobility;Bend;Caring for Others;Carry;Dressing;Sleep;Reach Overhead;Lift;Hygiene/Grooming;Stairs;Bathing;Sit    Examination-Participation Restrictions Cleaning;Community Activity;Driving;Interpersonal Relationship;Laundry;Volunteer;Meal Prep;Yard Work;Other    Stability/Clinical Decision Making Evolving/Moderate complexity    Rehab Potential Fair    PT Frequency 2x / week    PT Duration 8 weeks    PT Treatment/Interventions ADLs/Self Care Home Management;Biofeedback;Cryotherapy;Electrical Stimulation;Iontophoresis 38m/ml Dexamethasone;Moist Heat;Traction;Ultrasound;DME Instruction;Functional mobility training;Therapeutic activities;Patient/family education;Neuromuscular re-education;Therapeutic exercise;Manual techniques;Energy conservation;Dry needling;Passive range of motion;Taping;Vestibular;Vasopneumatic Device;Visual/perceptual remediation/compensation;Canalith Repostioning;Balance training    PT Next Visit Plan nerve glides, STM, posture    PT Home Exercise Plan median and ulnar modified nerve glides    Consulted and Agree with Plan of Care Patient           Patient will benefit from skilled therapeutic intervention in order to improve the following deficits and impairments:  Decreased activity tolerance, Decreased mobility, Decreased range of motion, Decreased strength, Hypomobility, Impaired flexibility, Impaired perceived functional ability, Increased muscle spasms, Postural dysfunction, Improper body mechanics, Pain, Decreased knowledge of precautions, Decreased scar mobility  Visit Diagnosis: Cervicalgia     Problem List Patient Active Problem List   Diagnosis Date Noted  . PFO (patent foramen ovale)   . CAD (coronary artery disease), native coronary artery   . Acute CVA (cerebrovascular  accident) ([place] 06/15/2018  . Depression 10/16/2017  . Migraine 10/16/2017  . Hyperglycemia, unspecified 07/10/2017  . Hyperglycemia 07/10/2017  . H/O dizziness 08/02/2016  . Cervical disc disorder with radiculopathy of cervical region 04/12/2016  . Nonallopathic lesion of thoracic region 04/12/2016  . Nonallopathic lesion of rib cage 04/12/2016  . Facet arthritis of cervical region 03/21/2016  . Hyperlipidemia 01/26/2015  . Labral tear of shoulder,  right, subsequent encounter 12/04/2014  . 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 Impingement syndrome of right shoulder 05/28/2014  . Right supraspinatus tenosynovitis 05/28/2014  . Arthritis pain 04/03/2014  . Fatigue 04/03/2014  . 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 IBS (irritable bowel syndrome) 06/20/2012  . Lumbar disc disease 06/20/2012   Phillips Grout PT, DPT, GCS  Lori Morales 04/08/2020, 4:31 PM  [place] MAIN [facility] SERVICES [address] [place], [place], [address] Phone: [phone]   Fax:  [phone]  Name: Lori Morales MRN: [id number] Date of Birth: May 11, 1961

## 2020-04-13 ENCOUNTER — Other Ambulatory Visit: Payer: Self-pay

## 2020-04-13 ENCOUNTER — Ambulatory Visit: Payer: 59

## 2020-04-13 DIAGNOSIS — M542 Cervicalgia: Secondary | ICD-10-CM

## 2020-04-13 DIAGNOSIS — R293 Abnormal posture: Secondary | ICD-10-CM

## 2020-04-13 DIAGNOSIS — M6281 Muscle weakness (generalized): Secondary | ICD-10-CM

## 2020-04-13 DIAGNOSIS — M5412 Radiculopathy, cervical region: Secondary | ICD-10-CM | POA: Diagnosis not present

## 2020-04-13 NOTE — Therapy (Signed)
[place] MAIN [facility] SERVICES [address] [place], [place], [address] Phone: [phone]   Fax:  [phone]  Physical Therapy Treatment  Patient Details  Name: BREIANA STRATMANN MRN: [id number] Date of Birth: 1960-07-29 Referring Provider (PT): Dr. Earleen Newport    Encounter Date: 04/13/2020   PT End of Session - 04/13/20 1308    Visit Number 22    Number of Visits 26    Date for PT Re-Evaluation 04/28/20    Authorization Type 1/10 PN 11/8    PT Start Time 0845    PT Stop Time 0928    PT Time Calculation (min) 43 min    Activity Tolerance Patient tolerated treatment well   lack of protocol   Behavior During Therapy [facility] for tasks assessed/performed           Past Medical History:  Diagnosis Date   Arthritis    CAD (coronary artery disease), native coronary artery    mild non calcified plaque in mid LAD and prox LCx on coronary CTA 01/2019   Depression    GERD (gastroesophageal reflux disease)    Headache    migraines   Hx of squamous cell carcinoma 07/11/2016   Right distal dorsum lat forearm    Hypertension    IBS (irritable bowel syndrome)    Palpitations    PFO (patent foramen ovale)    small by cardiac CTA 01/2019   PONV (postoperative nausea and vomiting)    Restless legs syndrome     Past Surgical History:  Procedure Laterality Date   ABDOMINOPLASTY     BACK SURGERY     1 laminectomy, 1 fusion   COLONOSCOPY     COLONOSCOPY WITH PROPOFOL N/A 01/17/2017   Procedure: COLONOSCOPY WITH PROPOFOL;  Surgeon: Lollie Sails, MD;  Location: [facility] ENDOSCOPY;  Service: Endoscopy;  Laterality: N/A;   WISDOM TOOTH EXTRACTION      There were no vitals filed for this visit.   Subjective Assessment - 04/13/20 0856    Subjective Patient returned from small roadtrip with minimal increase of pain. Reports she remembered to do her HEP and brought her stretch. Reports 75-80% better.    Pertinent History Patient  returning to PT after C6-7 arthroplasty with spur removal and discectomy on 12/06/19.  After one week started trying to quilt and had bad pain in her neck. Ever since surgery had pain in L side and shoulder with arm to forearm; can't sleep on L side right now. Is on a steroid taper now for reduction of pain. Was not given a protocol but was told no lifting more than 10lb, no cervical extension. Reports the surgeon wants to be conservative with treatment at the beginning. PMH of hyperglycemia, elevated BP, arthritis, HLD, restless legs, chronic insomnia, prior vertiginous migraine, thalamic CVA, anxiety, CAD, depression, GERD, hx of cancer, IBS, lumbar disc disease (prior back surgery with fusion x2 L4-5, S1), and cervical disc disease. 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    Limitations Sitting;Lifting;Standing;House hold activities;Reading    How long can you sit comfortably? neck pain limits sitting position    How long can you stand comfortably? n/a    How long can you walk comfortably? 2 miles    Patient Stated Goals decrease pain, improve ROM    Currently in Pain? Yes    Pain Score 1     Pain Location Neck  Pain Orientation Left    Pain Descriptors / Indicators Aching    Pain Type Chronic pain    Pain Onset More than a month ago             supine: use of heat pad under upper thoracic lower cervical region Cervical sidebend with gentle overpressure 3x30 second holds each direction  cervical rotation with gentle overpressure 3x 30 second holds each direction Suboccipital release 3x30 second holds  Cervical AROM extension 10x 3 second hold ; use of rolled up towel under neck for pain reduction; added change of robber stretch  Median nerve glides 15x each UE (more limited on the L), ulnar nerve glide 15x each UE  chin tucks 15x 5 second holds with scapular retraction  L C5 abduction isometric 10x 5 second holds 60-70% muscle  contraction      Prone:  Grade II-III mobilizations to thoracic spine with reduced hypomobility with repetition 4x 10 seconds each level J mobilization to upper thoracic/lower cervical for reduced thoracic kyphosis. 3x30 seconds  Inferio-medial rib glides L grade II-III 6x 10 seconds in varying planes of motion for reduction of tension and improve posture; hypomobile and tender initially in rib 6 and 7.   STM to upper trap, levator scap, cervical paraspinals x8 minutes with implementation of effleurage and ptrissage for muscle tissue tension reduction.    sidelying  3lb weight ER/IR LUE 15x  Seated: Scapular retraction with overpressure into depression  10x 10 second holds with chin tuck Bilateral arm abduction with external rotation into overhead raise stretch , breathe out and return back to center x 8 2lb  abduction 15x LUE 3lb ER 15x LUE  GTB row 15x   Pt educated throughout session about proper posture and technique with exercises. Improved exercise technique, movement at target joints, use of target muscles after min to mod verbal, visual, tactile cues.                         PT Education - 04/13/20 1306    Education Details exercise technique, manual, posture,    Person(s) Educated Patient    Methods Explanation;Demonstration;Tactile cues;Verbal cues    Comprehension Verbalized understanding;Returned demonstration;Verbal cues required;Tactile cues required            PT Short Term Goals - 03/03/20 1416      PT SHORT TERM GOAL #1   Title Patient will be independent in home exercise program to improve strength/mobility/pain relief for better functional independence with ADLs.    Baseline 8/11; HEP given 10/5: HEP compliant    Time 4    Period Weeks    Status Achieved    Target Date 02/05/20             PT Long Term Goals - 04/06/20 0942      PT LONG TERM GOAL #1   Title Patient will report a worst pain of 3/10 on VAS in cervical spine to  improve tolerance with ADLs and reduced symptoms with activities.    Baseline 8/11: 8/10 10/5: 5/10 11/8: 4/10    Time 8    Period Weeks    Status Partially Met    Target Date 04/28/20      PT LONG TERM GOAL #2   Title Patient will increase FOTO score to equal to or greater than  50/100   to demonstrate statistically significant improvement in mobility and quality of life.    Baseline 8/11: 40% 10/5: 10/5:  53% 11/8: 55%    Time 8    Period Weeks    Status Achieved      PT LONG TERM GOAL #3   Title Patient will reduce Neck Disability Index score to <10% to demonstrate minimal disability with ADLs including improved sleeping tolerance, sitting tolerance, etc for better mobility at home and work.    Baseline 8/11: 62% 10/5: 38% 11/8: 40%    Time 8    Period Weeks    Status Partially Met    Target Date 04/28/20      PT LONG TERM GOAL #4   Title Patient will increase cervical AROM within 10 degrees of functional range for improved function, ability to drive, and return to PLOF.    Baseline 8/11: extension deferred rotation: R 28 L 40, SB R 10 L 4 10/5: see note    Time 8    Period Weeks    Status Partially Met    Target Date 04/28/20                 Plan - 04/13/20 1316    Clinical Impression Statement Patient is progressing with functional cervical ROM, posture, and strength. She is able to tolerate progressive strengthening of C5 medial delt on L with increased repetition and weights. Chin tucks continue to progress with decreased difficulty. Patient would benefit from skilled physical therapy to reduce pain, improve muscle tissue length and posture, and return patient to PLOF    Personal Factors and Comorbidities Age;Comorbidity 3+;Past/Current Experience;Profession;Social Background;Time since onset of injury/illness/exacerbation    Comorbidities hyperglycemia, elevated BP, arthritis, HLD, restless legs, chronic insomnia, prior vertiginous migraine, thalamic CVA, anxiety,  CAD, depression, GERD, hx of cancer, IBS, lumbar disc disease (prior back surgery with fusion x2 L4-5, S1), and cervical disc disease.    Examination-Activity Limitations Bed Mobility;Bend;Caring for Others;Carry;Dressing;Sleep;Reach Overhead;Lift;Hygiene/Grooming;Stairs;Bathing;Sit    Examination-Participation Restrictions Cleaning;Community Activity;Driving;Interpersonal Relationship;Laundry;Volunteer;Meal Prep;Yard Work;Other    Stability/Clinical Decision Making Evolving/Moderate complexity    Rehab Potential Fair    PT Frequency 2x / week    PT Duration 8 weeks    PT Treatment/Interventions ADLs/Self Care Home Management;Biofeedback;Cryotherapy;Electrical Stimulation;Iontophoresis 28m/ml Dexamethasone;Moist Heat;Traction;Ultrasound;DME Instruction;Functional mobility training;Therapeutic activities;Patient/family education;Neuromuscular re-education;Therapeutic exercise;Manual techniques;Energy conservation;Dry needling;Passive range of motion;Taping;Vestibular;Vasopneumatic Device;Visual/perceptual remediation/compensation;Canalith Repostioning;Balance training    PT Next Visit Plan nerve glides, STM, posture    PT Home Exercise Plan median and ulnar modified nerve glides    Consulted and Agree with Plan of Care Patient           Patient will benefit from skilled therapeutic intervention in order to improve the following deficits and impairments:  Decreased activity tolerance, Decreased mobility, Decreased range of motion, Decreased strength, Hypomobility, Impaired flexibility, Impaired perceived functional ability, Increased muscle spasms, Postural dysfunction, Improper body mechanics, Pain, Decreased knowledge of precautions, Decreased scar mobility  Visit Diagnosis: Cervicalgia  Radiculopathy, cervical region  Abnormal posture  Muscle weakness (generalized)     Problem List Patient Active Problem List   Diagnosis Date Noted   PFO (patent foramen ovale)    CAD (coronary  artery disease), native coronary artery    [place]   Depression 10/16/2017   Migraine 10/16/2017   Hyperglycemia, unspecified 07/10/2017   Hyperglycemia 07/10/2017   H/O dizziness 08/02/2016   Cervical disc disorder with radiculopathy of cervical region 04/12/2016   Nonallopathic lesion of thoracic region 04/12/2016   Nonallopathic lesion of rib cage 04/12/2016   Facet arthritis of cervical region 03/21/2016   Hyperlipidemia 01/26/2015  Labral tear of shoulder, right, subsequent encounter 12/04/2014   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

## 2020-04-15 ENCOUNTER — Other Ambulatory Visit: Payer: Self-pay

## 2020-04-15 ENCOUNTER — Ambulatory Visit: Payer: 59

## 2020-04-15 DIAGNOSIS — M542 Cervicalgia: Secondary | ICD-10-CM | POA: Diagnosis not present

## 2020-04-15 DIAGNOSIS — M5412 Radiculopathy, cervical region: Secondary | ICD-10-CM | POA: Diagnosis not present

## 2020-04-15 DIAGNOSIS — R293 Abnormal posture: Secondary | ICD-10-CM | POA: Diagnosis not present

## 2020-04-15 DIAGNOSIS — M6281 Muscle weakness (generalized): Secondary | ICD-10-CM | POA: Diagnosis not present

## 2020-04-15 NOTE — Therapy (Signed)
[place] MAIN [facility] SERVICES [address] [place], [place], [address] Phone: [phone]   Fax:  [phone]  Physical Therapy Treatment  Patient Details  Name: Lori Morales MRN: [id number] Date of Birth: [birth year] Referring Provider (PT): Dr. Earleen Newport    Encounter Date: 04/15/2020   PT End of Session - 04/15/20 1440    Visit Number 23    Number of Visits 26    Date for PT Re-Evaluation 04/28/20    Authorization Type 1/10 PN 11/8    PT Start Time 1433    PT Stop Time 1515    PT Time Calculation (min) 42 min    Activity Tolerance Patient tolerated treatment well   lack of protocol   Behavior During Therapy [facility] for tasks assessed/performed           Past Medical History:  Diagnosis Date  . Arthritis   . CAD (coronary artery disease), native coronary artery    mild non calcified plaque in mid LAD and prox LCx on coronary CTA 01/2019  . Depression   . GERD (gastroesophageal reflux disease)   . Headache    migraines  . Hx of squamous cell carcinoma 07/11/2016   Right distal dorsum lat forearm   . Hypertension   . IBS (irritable bowel syndrome)   . Palpitations   . PFO (patent foramen ovale)    small by cardiac CTA 01/2019  . PONV (postoperative nausea and vomiting)   . Restless legs syndrome     Past Surgical History:  Procedure Laterality Date  . ABDOMINOPLASTY    . BACK SURGERY     1 laminectomy, 1 fusion  . COLONOSCOPY    . COLONOSCOPY WITH PROPOFOL N/A 01/17/2017   Procedure: COLONOSCOPY WITH PROPOFOL;  Surgeon: Lollie Sails, MD;  Location[facility] ENDOSCOPY;  Service: Endoscopy;  Laterality: N/A;  . WISDOM TOOTH EXTRACTION      There were no vitals filed for this visit.   Subjective Assessment - 04/15/20 1439    Subjective Pt reports that she is doing alright today but is having some L sided neck tightness upon arrival. She is still not having any pain radiating past her shoulder. N/T in LUE remains  resolved.    Pertinent History Patient returning to PT after C6-7 arthroplasty with spur removal and discectomy on 12/06/19.  After one week started trying to quilt and had bad pain in her neck. Ever since surgery had pain in L side and shoulder with arm to forearm; can't sleep on L side right now. Is on a steroid taper now for reduction of pain. Was not given a protocol but was told no lifting more than 10lb, no cervical extension. Reports the surgeon wants to be conservative with treatment at the beginning. PMH of hyperglycemia, elevated BP, arthritis, HLD, restless legs, chronic insomnia, prior vertiginous migraine, thalamic CVA, anxiety, CAD, depression, GERD, hx of cancer, IBS, lumbar disc disease (prior back surgery with fusion x2 L4-5, S1), and cervical disc disease. 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    Limitations Sitting;Lifting;Standing;House hold activities;Reading    How long can you sit comfortably? neck pain limits sitting position    How long can you stand comfortably? n/a    How long can you walk comfortably? 2 miles    Patient Stated Goals decrease pain, improve ROM    Currently in Pain? Yes    Pain  Score 3     Pain Location Neck    Pain Orientation Left    Pain Descriptors / Indicators Aching    Pain Type Chronic pain    Pain Onset More than a month ago    Pain Frequency Intermittent              TREATMENT   Manual Therapy  CPA C2-C4, grade II, 30s/bout x 2 bouts/level; CPA and L UPA T3-T7, grade III, 30s/bout x 3 bouts/level each; STM to L upper trap, L levator scapulae, L cervical paraspinals, L suboccipitals and L rhomboid major/minor with long duration trigger point release;   Trigger Point Dry Needling (TDN), unbilled Education performed with patient regarding potential benefit of TDN. Pt provided verbal consent to treatment. TDN performed to L upper trap and L suboccipitals with 1, 0.25 x 40  single needle placement in each muscle. Local twitch response (LTR) in L upper trap and deep ache in suboccipitals. Also performed TDN to L rhomboid major/minor/subscapularis with 3, 0.30 x 60 single needle placements. Deep ache in rhomboid major during 2/3 placements. Pistoning technique utilized as well as longer duration placements during significant twitching.   Pt reports significant improvement in her pain following trigger point dry needling and manual techniques. She arrives with notable trigger points to palpation in L upper trap. Significant relief today with needling to rhomboid major. Additional manual techniques utilized such as STM and cervical/thoracic mobilizations.  No HEP modifications provided on this date. Pt encouraged to follow-up as scheduled. Pt will benefit from PT services to address deficits in pain in order to resume full function at home and work.                               PT Short Term Goals - 03/03/20 1416      PT SHORT TERM GOAL #1   Title Patient will be independent in home exercise program to improve strength/mobility/pain relief for better functional independence with ADLs.    Baseline 8/11; HEP given 10/5: HEP compliant    Time 4    Period Weeks    Status Achieved    Target Date 02/05/20             PT Long Term Goals - 04/06/20 0942      PT LONG TERM GOAL #1   Title Patient will report a worst pain of 3/10 on VAS in cervical spine to improve tolerance with ADLs and reduced symptoms with activities.    Baseline 8/11: 8/10 10/5: 5/10 11/8: 4/10    Time 8    Period Weeks    Status Partially Met    Target Date 04/28/20      PT LONG TERM GOAL #2   Title Patient will increase FOTO score to equal to or greater than  50/100   to demonstrate statistically significant improvement in mobility and quality of life.    Baseline 8/11: 40% 10/5: 10/5: 53% 11/8: 55%    Time 8    Period Weeks    Status Achieved      PT LONG TERM  GOAL #3   Title Patient will reduce Neck Disability Index score to <10% to demonstrate minimal disability with ADL's including improved sleeping tolerance, sitting tolerance, etc for better mobility at home and work.    Baseline 8/11: 62% 10/5: 38% 11/8: 40%    Time 8    Period Weeks    Status  Partially Met    Target Date 04/28/20      PT LONG TERM GOAL #4   Title Patient will increase cervical AROM within 10 degrees of functional range for improved function, ability to drive, and return to PLOF.    Baseline 8/11: extension deferred rotation: R 28 L 40, SB R 10 L 4 10/5: see note    Time 8    Period Weeks    Status Partially Met    Target Date 04/28/20                 Plan - 04/15/20 1440    Clinical Impression Statement Pt reports significant improvement in her pain following trigger point dry needling and manual techniques. She arrives with notable trigger points to palpation in L upper trap. Significant relief today with needling to rhomboid major. Additional manual techniques utilized such as STM and cervical/thoracic mobilizations.  No HEP modifications provided on this date. Pt encouraged to follow-up as scheduled. Pt will benefit from PT services to address deficits in pain in order to resume full function at home and work.    Personal Factors and Comorbidities Age;Comorbidity 3+;Past/Current Experience;Profession;Social Background;Time since onset of injury/illness/exacerbation    Comorbidities hyperglycemia, elevated BP, arthritis, HLD, restless legs, chronic insomnia, prior vertiginous migraine, thalamic CVA, anxiety, CAD, depression, GERD, hx of cancer, IBS, lumbar disc disease (prior back surgery with fusion x2 L4-5, S1), and cervical disc disease.    Examination-Activity Limitations Bed Mobility;Bend;Caring for Others;Carry;Dressing;Sleep;Reach Overhead;Lift;Hygiene/Grooming;Stairs;Bathing;Sit    Examination-Participation Restrictions Cleaning;Community  Activity;Driving;Interpersonal Relationship;Laundry;Volunteer;Meal Prep;Yard Work;Other    Stability/Clinical Decision Making Evolving/Moderate complexity    Rehab Potential Fair    PT Frequency 2x / week    PT Duration 8 weeks    PT Treatment/Interventions ADLs/Self Care Home Management;Biofeedback;Cryotherapy;Electrical Stimulation;Iontophoresis 45m/ml Dexamethasone;Moist Heat;Traction;Ultrasound;DME Instruction;Functional mobility training;Therapeutic activities;Patient/family education;Neuromuscular re-education;Therapeutic exercise;Manual techniques;Energy conservation;Dry needling;Passive range of motion;Taping;Vestibular;Vasopneumatic Device;Visual/perceptual remediation/compensation;Canalith Repostioning;Balance training    PT Next Visit Plan nerve glides, STM, posture    PT Home Exercise Plan median and ulnar modified nerve glides    Consulted and Agree with Plan of Care Patient           Patient will benefit from skilled therapeutic intervention in order to improve the following deficits and impairments:  Decreased activity tolerance, Decreased mobility, Decreased range of motion, Decreased strength, Hypomobility, Impaired flexibility, Impaired perceived functional ability, Increased muscle spasms, Postural dysfunction, Improper body mechanics, Pain, Decreased knowledge of precautions, Decreased scar mobility  Visit Diagnosis: Cervicalgia     Problem List Patient Active Problem List   Diagnosis Date Noted  . PFO (patent foramen ovale)   . CAD (coronary artery disease), native coronary artery   . [place]  . Depression 10/16/2017  . Migraine 10/16/2017  . Hyperglycemia, unspecified 07/10/2017  . Hyperglycemia 07/10/2017  . H/O dizziness 08/02/2016  . Cervical disc disorder with radiculopathy of cervical region 04/12/2016  . Nonallopathic lesion of thoracic region 04/12/2016  . Nonallopathic lesion of rib cage 04/12/2016  .  Facet arthritis of cervical region 03/21/2016  . Hyperlipidemia 01/26/2015  . Labral tear of shoulder, right, subsequent encounter 12/04/2014  . 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 Impingement syndrome of right shoulder 05/28/2014  . Right supraspinatus tenosynovitis 05/28/2014  . Arthritis pain 04/03/2014  . Fatigue 04/03/2014  . 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 IBS (irritable bowel syndrome) 06/20/2012  . Lumbar disc disease 06/20/2012   JLyndel SafeHuprich PT, DPT, GCS  Hafsa Lohn 04/15/2020, 3:54 PM  [place] MAIN [facility] SERVICES [address] [place], [place], [address] Phone: [phone]   Fax:  [phone]  Name: Lori Morales MRN: [id number] Date of Birth: 1961-05-21

## 2020-04-20 ENCOUNTER — Other Ambulatory Visit: Payer: Self-pay

## 2020-04-20 ENCOUNTER — Ambulatory Visit: Payer: 59

## 2020-04-20 DIAGNOSIS — M5412 Radiculopathy, cervical region: Secondary | ICD-10-CM | POA: Diagnosis not present

## 2020-04-20 DIAGNOSIS — R293 Abnormal posture: Secondary | ICD-10-CM | POA: Diagnosis not present

## 2020-04-20 DIAGNOSIS — M6281 Muscle weakness (generalized): Secondary | ICD-10-CM | POA: Diagnosis not present

## 2020-04-20 DIAGNOSIS — M542 Cervicalgia: Secondary | ICD-10-CM | POA: Diagnosis not present

## 2020-04-20 NOTE — Therapy (Signed)
[place] MAIN [facility] SERVICES [address] [place], [place], [address] Phone: [phone]   Fax:  [phone]  Physical Therapy Treatment  Patient Details  Name: Lori Morales MRN: [id number] Date of Birth: [birth year] Referring Provider (PT): Dr. Earleen Newport    Encounter Date: 04/20/2020   PT End of Session - 04/20/20 1250    Visit Number 24    Number of Visits 26    Date for PT Re-Evaluation 04/28/20    Authorization Type 4/10 PN 11/8    PT Start Time 0846    PT Stop Time 0926    PT Time Calculation (min) 40 min    Activity Tolerance Patient tolerated treatment well   lack of protocol   Behavior During Therapy [facility] for tasks assessed/performed           Past Medical History:  Diagnosis Date  . Arthritis   . CAD (coronary artery disease), native coronary artery    mild non calcified plaque in mid LAD and prox LCx on coronary CTA 01/2019  . Depression   . GERD (gastroesophageal reflux disease)   . Headache    migraines  . Hx of squamous cell carcinoma 07/11/2016   Right distal dorsum lat forearm   . Hypertension   . IBS (irritable bowel syndrome)   . Palpitations   . PFO (patent foramen ovale)    small by cardiac CTA 01/2019  . PONV (postoperative nausea and vomiting)   . Restless legs syndrome     Past Surgical History:  Procedure Laterality Date  . ABDOMINOPLASTY    . BACK SURGERY     1 laminectomy, 1 fusion  . COLONOSCOPY    . COLONOSCOPY WITH PROPOFOL N/A 01/17/2017   Procedure: COLONOSCOPY WITH PROPOFOL;  Surgeon: Lollie Sails, MD;  Location: [facility] ENDOSCOPY;  Service: Endoscopy;  Laterality: N/A;  . WISDOM TOOTH EXTRACTION      There were no vitals filed for this visit.   Subjective Assessment - 04/20/20 1243    Subjective Patient reports she overdid it sewing and cutting over the weekend and is feeling incredibly stiff today.  Reports stiffness if 4/10 through upper trap and into L arm.    Pertinent  History Patient returning to PT after C6-7 arthroplasty with spur removal and discectomy on 12/06/19.  After one week started trying to quilt and had bad pain in her neck. Ever since surgery had pain in L side and shoulder with arm to forearm; can't sleep on L side right now. Is on a steroid taper now for reduction of pain. Was not given a protocol but was told no lifting more than 10lb, no cervical extension. Reports the surgeon wants to be conservative with treatment at the beginning. PMH of hyperglycemia, elevated BP, arthritis, HLD, restless legs, chronic insomnia, prior vertiginous migraine, thalamic CVA, anxiety, CAD, depression, GERD, hx of cancer, IBS, lumbar disc disease (prior back surgery with fusion x2 L4-5, S1), and cervical disc disease. 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    Limitations Sitting;Lifting;Standing;House hold activities;Reading    How long can you sit comfortably? neck pain limits sitting position    How long can you stand comfortably? n/a    How long can you walk comfortably? 2 miles    Patient Stated Goals decrease pain, improve ROM    Currently in Pain? Yes    Pain Score 4  Pain Location Neck    Pain Orientation Left    Pain Descriptors / Indicators Aching    Pain Type Chronic pain    Pain Onset More than a month ago    Pain Frequency Intermittent                 supine: use of heat pad under upper thoracic lower cervical region Cervical sidebend with gentle overpressure 3x30 second holds each direction  cervical rotation with gentle overpressure 3x 30 second holds each direction Suboccipital release 3x30 second holds  Cervical AROM extension 10x 3 second hold ; use of rolled up towel under neck for pain reduction; added change of robber stretch  Median nerve glides 15x each UE (more limited on the L), ulnar nerve glide 15x each UE  chin tucks 15x 5 second holds with scapular retraction   Bicep trigger point release with movement x 6 minutes     Prone:  Grade II-III mobilizations to thoracic spine with reduced hypomobility with repetition 4x 10 seconds each level J mobilization to upper thoracic/lower cervical for reduced thoracic kyphosis. 3x30 seconds  Inferio-medial rib glides L grade II-III 6x 10 seconds in varying planes of motion for reduction of tension and improve posture; hypomobile and tender initially in rib 6 and 7.   STM to upper trap, levator scap, cervical paraspinals x9 minutes with implementation of effleurage and ptrissage for muscle tissue tension reduction.    Seated: Scapular retraction with overpressure into depression  10x 10 second holds with chin tuck Bilateral arm abduction with external rotation into overhead raise stretch , breathe out and return back to center x 8 Three way upper trap stretch 20-30 second hols   Pt educated throughout session about proper posture and technique with exercises. Improved exercise technique, movement at target joints, use of target muscles after min to mod verbal, visual, tactile cues.                    PT Education - 04/20/20 1244    Education Details exercise technique, body mechanics, manual    Person(s) Educated Patient    Methods Explanation;Demonstration;Tactile cues;Verbal cues    Comprehension Verbalized understanding;Returned demonstration;Verbal cues required;Tactile cues required            PT Short Term Goals - 03/03/20 1416      PT SHORT TERM GOAL #1   Title Patient will be independent in home exercise program to improve strength/mobility/pain relief for better functional independence with ADLs.    Baseline 8/11; HEP given 10/5: HEP compliant    Time 4    Period Weeks    Status Achieved    Target Date 02/05/20             PT Long Term Goals - 04/06/20 0942      PT LONG TERM GOAL #1   Title Patient will report a worst pain of 3/10 on VAS in cervical spine to improve  tolerance with ADLs and reduced symptoms with activities.    Baseline 8/11: 8/10 10/5: 5/10 11/8: 4/10    Time 8    Period Weeks    Status Partially Met    Target Date 04/28/20      PT LONG TERM GOAL #2   Title Patient will increase FOTO score to equal to or greater than  50/100   to demonstrate statistically significant improvement in mobility and quality of life.    Baseline 8/11: 40% 10/5: 10/5: 53% 11/8: 55%  Time 8    Period Weeks    Status Achieved      PT LONG TERM GOAL #3   Title Patient will reduce Neck Disability Index score to <10% to demonstrate minimal disability with ADL's including improved sleeping tolerance, sitting tolerance, etc for better mobility at home and work.    Baseline 8/11: 62% 10/5: 38% 11/8: 40%    Time 8    Period Weeks    Status Partially Met    Target Date 04/28/20      PT LONG TERM GOAL #4   Title Patient will increase cervical AROM within 10 degrees of functional range for improved function, ability to drive, and return to PLOF.    Baseline 8/11: extension deferred rotation: R 28 L 40, SB R 10 L 4 10/5: see note    Time 8    Period Weeks    Status Partially Met    Target Date 04/28/20                 Plan - 04/20/20 1251    Clinical Impression Statement Patient reports increased L cervical, upper trap, and arm pain from overdoing it yesterday. Intensive soft tissue combined with therex resulted in improved pain levels by end of session. Patient's bicep is limited initially and painful however repeated trigger point with movement resulted in decreased radiating symptoms. Postural correction improved by end of session. Patient would benefit from skilled physical therapy to reduce pain, improve muscle tissue length and posture, and return patient to PLOF    Personal Factors and Comorbidities Age;Comorbidity 3+;Past/Current Experience;Profession;Social Background;Time since onset of injury/illness/exacerbation    Comorbidities hyperglycemia,  elevated BP, arthritis, HLD, restless legs, chronic insomnia, prior vertiginous migraine, thalamic CVA, anxiety, CAD, depression, GERD, hx of cancer, IBS, lumbar disc disease (prior back surgery with fusion x2 L4-5, S1), and cervical disc disease.    Examination-Activity Limitations Bed Mobility;Bend;Caring for Others;Carry;Dressing;Sleep;Reach Overhead;Lift;Hygiene/Grooming;Stairs;Bathing;Sit    Examination-Participation Restrictions Cleaning;Community Activity;Driving;Interpersonal Relationship;Laundry;Volunteer;Meal Prep;Yard Work;Other    Stability/Clinical Decision Making Evolving/Moderate complexity    Rehab Potential Fair    PT Frequency 2x / week    PT Duration 8 weeks    PT Treatment/Interventions ADLs/Self Care Home Management;Biofeedback;Cryotherapy;Electrical Stimulation;Iontophoresis 11m/ml Dexamethasone;Moist Heat;Traction;Ultrasound;DME Instruction;Functional mobility training;Therapeutic activities;Patient/family education;Neuromuscular re-education;Therapeutic exercise;Manual techniques;Energy conservation;Dry needling;Passive range of motion;Taping;Vestibular;Vasopneumatic Device;Visual/perceptual remediation/compensation;Canalith Repostioning;Balance training    PT Next Visit Plan nerve glides, STM, posture    PT Home Exercise Plan median and ulnar modified nerve glides    Consulted and Agree with Plan of Care Patient           Patient will benefit from skilled therapeutic intervention in order to improve the following deficits and impairments:  Decreased activity tolerance, Decreased mobility, Decreased range of motion, Decreased strength, Hypomobility, Impaired flexibility, Impaired perceived functional ability, Increased muscle spasms, Postural dysfunction, Improper body mechanics, Pain, Decreased knowledge of precautions, Decreased scar mobility  Visit Diagnosis: Cervicalgia  Radiculopathy, cervical region  Abnormal posture  Muscle weakness  (generalized)     Problem List Patient Active Problem List   Diagnosis Date Noted  . PFO (patent foramen ovale)   . CAD (coronary artery disease), native coronary artery   . [place]  . Depression 10/16/2017  . Migraine 10/16/2017  . Hyperglycemia, unspecified 07/10/2017  . Hyperglycemia 07/10/2017  . H/O dizziness 08/02/2016  . Cervical disc disorder with radiculopathy of cervical region 04/12/2016  . Nonallopathic lesion of thoracic region 04/12/2016  . Nonallopathic lesion of rib cage 04/12/2016  .  Facet arthritis of cervical region 03/21/2016  . Hyperlipidemia 01/26/2015  . Labral tear of shoulder, right, subsequent encounter 12/04/2014  . 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 Impingement syndrome of right shoulder 05/28/2014  . Right supraspinatus tenosynovitis 05/28/2014  . Arthritis pain 04/03/2014  . Fatigue 04/03/2014  . 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 IBS (irritable bowel syndrome) 06/20/2012  . Lumbar disc disease 06/20/2012   Janna Arch, PT, DPT   04/20/2020, 12:53 PM  [place] [facility] SERVICES [address] [place], [place], [address] Phone: [phone]   Fax:  [phone]  Name: Lori Morales MRN: [id number] Date of Birth: 1960/11/26

## 2020-04-22 ENCOUNTER — Other Ambulatory Visit: Payer: Self-pay | Admitting: Internal Medicine

## 2020-04-22 ENCOUNTER — Other Ambulatory Visit: Payer: Self-pay

## 2020-04-22 ENCOUNTER — Other Ambulatory Visit: Payer: Self-pay | Admitting: Obstetrics and Gynecology

## 2020-04-22 ENCOUNTER — Ambulatory Visit: Payer: 59

## 2020-04-22 DIAGNOSIS — M542 Cervicalgia: Secondary | ICD-10-CM | POA: Diagnosis not present

## 2020-04-22 DIAGNOSIS — M5412 Radiculopathy, cervical region: Secondary | ICD-10-CM

## 2020-04-22 DIAGNOSIS — Z1231 Encounter for screening mammogram for malignant neoplasm of breast: Secondary | ICD-10-CM

## 2020-04-22 DIAGNOSIS — R293 Abnormal posture: Secondary | ICD-10-CM

## 2020-04-22 DIAGNOSIS — M6281 Muscle weakness (generalized): Secondary | ICD-10-CM | POA: Diagnosis not present

## 2020-04-22 NOTE — Therapy (Signed)
[place] MAIN [facility] SERVICES [address] [place], [place], [address] Phone: [phone]   Fax:  [phone]  Physical Therapy Treatment  Patient Details  Name: Lori Morales MRN: [id number] Date of Birth: 1960-10-14 Referring Provider (PT): Dr. Earleen Newport    Encounter Date: 04/22/2020   PT End of Session - 04/22/20 0935    Visit Number 25    Number of Visits 26    Date for PT Re-Evaluation 04/28/20    Authorization Type 5/10 PN 11/8    PT Start Time 0845    PT Stop Time 0928    PT Time Calculation (min) 43 min    Activity Tolerance Patient tolerated treatment well   lack of protocol   Behavior During Therapy [facility] for tasks assessed/performed           Past Medical History:  Diagnosis Date   Arthritis    CAD (coronary artery disease), native coronary artery    mild non calcified plaque in mid LAD and prox LCx on coronary CTA 01/2019   Depression    GERD (gastroesophageal reflux disease)    Headache    migraines   Hx of squamous cell carcinoma 07/11/2016   Right distal dorsum lat forearm    Hypertension    IBS (irritable bowel syndrome)    Palpitations    PFO (patent foramen ovale)    small by cardiac CTA 01/2019   PONV (postoperative nausea and vomiting)    Restless legs syndrome     Past Surgical History:  Procedure Laterality Date   ABDOMINOPLASTY     BACK SURGERY     1 laminectomy, 1 fusion   COLONOSCOPY     COLONOSCOPY WITH PROPOFOL N/A 01/17/2017   Procedure: COLONOSCOPY WITH PROPOFOL;  Surgeon: Lollie Sails, MD;  Location: [facility] ENDOSCOPY;  Service: Endoscopy;  Laterality: N/A;   WISDOM TOOTH EXTRACTION      There were no vitals filed for this visit.   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 Feels sharp stabbing and some radiating numbness.     Pertinent History Patient returning to PT after C6-7 arthroplasty with spur removal and discectomy on 12/06/19.  After one week started trying to quilt and had bad pain in her neck. Ever since surgery had pain in L side and shoulder with arm to forearm; can't sleep on L side right now. Is on a steroid taper now for reduction of pain. Was not given a protocol but was told no lifting more than 10lb, no cervical extension. Reports the surgeon wants to be conservative with treatment at the beginning. PMH of hyperglycemia, elevated BP, arthritis, HLD, restless legs, chronic insomnia, prior vertiginous migraine, thalamic CVA, anxiety, CAD, depression, GERD, hx of cancer, IBS, lumbar disc disease (prior back surgery with fusion x2 L4-5, S1), and cervical disc disease. 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    Limitations Sitting;Lifting;Standing;House hold activities;Reading    How long can you sit comfortably? neck pain limits sitting position    How long can you stand comfortably? n/a    How long can you walk comfortably? 2 miles    Patient Stated Goals decrease pain, improve ROM    Currently in Pain? Yes    Pain Score 5  Pain Location Shoulder    Pain Orientation Right    Pain Descriptors / Indicators Aching    Pain Type Chronic pain    Pain Onset More than a month ago    Pain Frequency Intermittent               Treatment: Patient's R arm examined, anterior delt and bicep WFL, middle delt and posterior delt 4-/5 pain with resisted motion. Palpation tender to insertion point, no bulging musculature noted, edema apparent but discoloration not present. Consistent with mechanism of injury suggesting strain to posterior and medial delt.      supine: use of heat pad under upper thoracic lower cervical region Cervical sidebend with gentle overpressure 3x30 second holds each direction  cervical rotation with gentle overpressure 3x 30  second holds each direction Suboccipital release 3x30 second holds  Cervical AROM extension 10x 3 second hold ; use of rolled up towel under neck for pain reduction; added change of robber stretch  Median nerve glides 15x each UE (more limited on the L), ulnar nerve glide 15x each UE  chin tucks 15x 5 second holds with scapular retraction  Bicep trigger point release with movement x 6 minutes     Prone:  Grade II-III mobilizations to thoracic spine with reduced hypomobility with repetition 4x 10 seconds each level J mobilization to upper thoracic/lower cervical for reduced thoracic kyphosis. 3x30 seconds  Inferio-medial rib glides L grade II-III 6x 10 seconds in varying planes of motion for reduction of tension and improve posture; hypomobile and tender initially in rib 6 and 7.   STM to upper trap, levator scap, cervical paraspinals x9 minutes with implementation of effleurage and ptrissage for muscle tissue tension reduction.   Standing: Bicep lengthening stretch on table. 60 seconds x 2 trials  Seated bicep release trigger point release with flexion/extension and supination/pronation for self release     Pt educated throughout session about proper posture and technique with exercises. Improved exercise technique, movement at target joints, use of target muscles after min to mod verbal, visual, tactile cues.                   PT Education - 04/22/20 0934    Education Details exercise technique, rest R shoulder    Person(s) Educated Patient    Methods Explanation;Demonstration;Tactile cues;Verbal cues    Comprehension Verbalized understanding;Returned demonstration;Verbal cues required;Tactile cues required            PT Short Term Goals - 03/03/20 1416      PT SHORT TERM GOAL #1   Title Patient will be independent in home exercise program to improve strength/mobility/pain relief for better functional independence with ADLs.    Baseline 8/11; HEP given 10/5: HEP  compliant    Time 4    Period Weeks    Status Achieved    Target Date 02/05/20             PT Long Term Goals - 04/06/20 0942      PT LONG TERM GOAL #1   Title Patient will report a worst pain of 3/10 on VAS in cervical spine to improve tolerance with ADLs and reduced symptoms with activities.    Baseline 8/11: 8/10 10/5: 5/10 11/8: 4/10    Time 8    Period Weeks    Status Partially Met    Target Date 04/28/20      PT LONG TERM GOAL #2   Title Patient will increase FOTO score to  equal to or greater than  50/100   to demonstrate statistically significant improvement in mobility and quality of life.    Baseline 8/11: 40% 10/5: 10/5: 53% 11/8: 55%    Time 8    Period Weeks    Status Achieved      PT LONG TERM GOAL #3   Title Patient will reduce Neck Disability Index score to <10% to demonstrate minimal disability with ADLs including improved sleeping tolerance, sitting tolerance, etc for better mobility at home and work.    Baseline 8/11: 62% 10/5: 38% 11/8: 40%    Time 8    Period Weeks    Status Partially Met    Target Date 04/28/20      PT LONG TERM GOAL #4   Title Patient will increase cervical AROM within 10 degrees of functional range for improved function, ability to drive, and return to PLOF.    Baseline 8/11: extension deferred rotation: R 28 L 40, SB R 10 L 4 10/5: see note    Time 8    Period Weeks    Status Partially Met    Target Date 04/28/20                 Plan - 04/22/20 0945    Clinical Impression Statement Patient presents with new onset of R shoulder pain that is examined and most likely indicative of a sprain of posterior and medial deltoid as is concurrent with mechanism of injury. Patient educated on care and verbalized understanding. Is aware next session will evaluate goals and POC. Cervical ROM continues to improve with tension and posture remaining primary issue at this time. Patient would benefit from skilled physical therapy to reduce  pain, improve muscle tissue length and posture, and return patient to PLOF    Personal Factors and Comorbidities Age;Comorbidity 3+;Past/Current Experience;Profession;Social Background;Time since onset of injury/illness/exacerbation    Comorbidities hyperglycemia, elevated BP, arthritis, HLD, restless legs, chronic insomnia, prior vertiginous migraine, thalamic CVA, anxiety, CAD, depression, GERD, hx of cancer, IBS, lumbar disc disease (prior back surgery with fusion x2 L4-5, S1), and cervical disc disease.    Examination-Activity Limitations Bed Mobility;Bend;Caring for Others;Carry;Dressing;Sleep;Reach Overhead;Lift;Hygiene/Grooming;Stairs;Bathing;Sit    Examination-Participation Restrictions Cleaning;Community Activity;Driving;Interpersonal Relationship;Laundry;Volunteer;Meal Prep;Yard Work;Other    Stability/Clinical Decision Making Evolving/Moderate complexity    Rehab Potential Fair    PT Frequency 2x / week    PT Duration 8 weeks    PT Treatment/Interventions ADLs/Self Care Home Management;Biofeedback;Cryotherapy;Electrical Stimulation;Iontophoresis 30m/ml Dexamethasone;Moist Heat;Traction;Ultrasound;DME Instruction;Functional mobility training;Therapeutic activities;Patient/family education;Neuromuscular re-education;Therapeutic exercise;Manual techniques;Energy conservation;Dry needling;Passive range of motion;Taping;Vestibular;Vasopneumatic Device;Visual/perceptual remediation/compensation;Canalith Repostioning;Balance training    PT Next Visit Plan recert. goals    PT Home Exercise Plan median and ulnar modified nerve glides    Consulted and Agree with Plan of Care Patient           Patient will benefit from skilled therapeutic intervention in order to improve the following deficits and impairments:  Decreased activity tolerance, Decreased mobility, Decreased range of motion, Decreased strength, Hypomobility, Impaired flexibility, Impaired perceived functional ability, Increased muscle  spasms, Postural dysfunction, Improper body mechanics, Pain, Decreased knowledge of precautions, Decreased scar mobility  Visit Diagnosis: Cervicalgia  Radiculopathy, cervical region  Abnormal posture  Muscle weakness (generalized)     Problem List Patient Active Problem List   Diagnosis Date Noted   PFO (patent foramen ovale)    CAD (coronary artery disease), native coronary artery    [place]   Depression 10/16/2017   Migraine 10/16/2017  Hyperglycemia, unspecified 07/10/2017   Hyperglycemia 07/10/2017   H/O dizziness 08/02/2016   Cervical disc disorder with radiculopathy of cervical region 04/12/2016   Nonallopathic lesion of thoracic region 04/12/2016   Nonallopathic lesion of rib cage 04/12/2016   Facet arthritis of cervical region 03/21/2016   Hyperlipidemia 01/26/2015   Labral tear of shoulder, right, subsequent encounter 12/04/2014   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

## 2020-04-29 ENCOUNTER — Encounter: Payer: Self-pay | Admitting: Dermatology

## 2020-04-29 ENCOUNTER — Ambulatory Visit: Payer: 59 | Attending: Internal Medicine

## 2020-04-29 ENCOUNTER — Ambulatory Visit (INDEPENDENT_AMBULATORY_CARE_PROVIDER_SITE_OTHER): Payer: 59 | Admitting: Dermatology

## 2020-04-29 ENCOUNTER — Other Ambulatory Visit: Payer: Self-pay

## 2020-04-29 DIAGNOSIS — M5440 Lumbago with sciatica, unspecified side: Secondary | ICD-10-CM | POA: Diagnosis not present

## 2020-04-29 DIAGNOSIS — D229 Melanocytic nevi, unspecified: Secondary | ICD-10-CM

## 2020-04-29 DIAGNOSIS — Z1283 Encounter for screening for malignant neoplasm of skin: Secondary | ICD-10-CM | POA: Diagnosis not present

## 2020-04-29 DIAGNOSIS — M6281 Muscle weakness (generalized): Secondary | ICD-10-CM | POA: Diagnosis not present

## 2020-04-29 DIAGNOSIS — L814 Other melanin hyperpigmentation: Secondary | ICD-10-CM | POA: Diagnosis not present

## 2020-04-29 DIAGNOSIS — M5412 Radiculopathy, cervical region: Secondary | ICD-10-CM | POA: Diagnosis not present

## 2020-04-29 DIAGNOSIS — R278 Other lack of coordination: Secondary | ICD-10-CM | POA: Diagnosis not present

## 2020-04-29 DIAGNOSIS — L821 Other seborrheic keratosis: Secondary | ICD-10-CM | POA: Diagnosis not present

## 2020-04-29 DIAGNOSIS — L578 Other skin changes due to chronic exposure to nonionizing radiation: Secondary | ICD-10-CM

## 2020-04-29 DIAGNOSIS — D1800 Hemangioma unspecified site: Secondary | ICD-10-CM | POA: Diagnosis not present

## 2020-04-29 DIAGNOSIS — M542 Cervicalgia: Secondary | ICD-10-CM | POA: Insufficient documentation

## 2020-04-29 DIAGNOSIS — L820 Inflamed seborrheic keratosis: Secondary | ICD-10-CM | POA: Diagnosis not present

## 2020-04-29 DIAGNOSIS — Z86007 Personal history of in-situ neoplasm of skin: Secondary | ICD-10-CM

## 2020-04-29 DIAGNOSIS — R293 Abnormal posture: Secondary | ICD-10-CM | POA: Diagnosis not present

## 2020-04-29 NOTE — Patient Instructions (Signed)
Cryotherapy Aftercare  . Wash gently with soap and water everyday.   . Apply Vaseline and Band-Aid daily until healed.  

## 2020-04-29 NOTE — Progress Notes (Signed)
   Follow-Up Visit   Subjective  Lori Morales is a 59 y.o. female who presents for the following: Annual Exam (TBSE ). Hx of  The patient presents for Total-Body Skin Exam (TBSE) for skin cancer screening and mole check.  The following portions of the chart were reviewed this encounter and updated as appropriate:  Tobacco  Allergies  Meds  Problems  Med Hx  Surg Hx  Fam Hx     Review of Systems:  No other skin or systemic complaints except as noted in HPI or Assessment and Plan.  Objective  Well appearing patient in no apparent distress; mood and affect are within normal limits.  A full examination was performed including scalp, head, eyes, ears, nose, lips, neck, chest, axillae, abdomen, back, buttocks, bilateral upper extremities, bilateral lower extremities, hands, feet, fingers, toes, fingernails, and toenails. All findings within normal limits unless otherwise noted below.  Objective  Right distal lateral forearm: Well healed scar with no evidence of recurrence.   Objective  L temple, L forearm (3): Erythematous keratotic or waxy stuck-on papule or plaque.    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 Observe for recurrence. Call clinic for new or changing lesions.  Recommend regular skin exams, daily broad-spectrum spf 30+ sunscreen use, and photoprotection.    Inflamed seborrheic keratosis (3) L temple, L forearm  Destruction of lesion - L temple, L forearm Complexity: simple   Destruction method: cryotherapy   Informed consent: discussed and consent obtained   Timeout:  patient name, date of birth, surgical site, and procedure verified Lesion destroyed using liquid nitrogen: Yes   Region frozen until ice ball extended beyond lesion: Yes   Outcome: patient tolerated procedure well with no complications   Post-procedure details: wound care instructions given    Skin cancer screening    Lentigines - Scattered tan macules - Discussed due to sun exposure - Benign, observe - Call for any changes  Seborrheic Keratoses - Stuck-on, waxy, tan-brown papules and plaques  - Discussed benign etiology and prognosis. - Observe - Call for any changes  Melanocytic Nevi - Tan-brown and/or pink-flesh-colored symmetric macules and papules - Benign appearing on exam today - Observation - Call clinic for new or changing moles - Recommend daily use of broad spectrum spf 30+ sunscreen to sun-exposed areas.   Hemangiomas - Red papules - Discussed benign nature - Observe - Call for any changes  Actinic Damage - Chronic, secondary to cumulative UV/sun exposure - diffuse scaly erythematous macules with underlying dyspigmentation - Recommend daily broad spectrum sunscreen SPF 30+ to sun-exposed areas, reapply every 2 hours as needed.  - Call for new or changing lesions.  Skin cancer screening performed today.  Return in about 1 year (around 04/29/2021) for TBSE .  IMarye Round, CMA, am acting as scribe for Sarina Ser, MD .  Documentation: I have reviewed the above documentation for accuracy and completeness, and I agree with the above.  Sarina Ser, MD

## 2020-04-29 NOTE — Therapy (Signed)
[place] MAIN [facility] SERVICES [address] [place], [place], [address] Phone: [phone]   Fax:  [phone]  Physical Therapy Treatment/RECERT  Patient Details  Name: Lori Morales MRN: [id number] Date of Birth: [birth year] Referring Provider (PT): Dr. Earleen Newport    Encounter Date: 04/29/2020   PT End of Session - 04/29/20 1535    Visit Number 26    Number of Visits 34    Date for PT Re-Evaluation 06/24/20    Authorization Type 6/10 PN 11/8    PT Start Time 1516    PT Stop Time 1559    PT Time Calculation (min) 43 min    Activity Tolerance Patient tolerated treatment well   lack of protocol   Behavior During Therapy [facility] for tasks assessed/performed           Past Medical History:  Diagnosis Date  . Arthritis   . CAD (coronary artery disease), native coronary artery    mild non calcified plaque in mid LAD and prox LCx on coronary CTA 01/2019  . Depression   . GERD (gastroesophageal reflux disease)   . Headache    migraines  . Hx of squamous cell carcinoma 07/11/2016   Right distal dorsum lat forearm   . Hypertension   . IBS (irritable bowel syndrome)   . Palpitations   . PFO (patent foramen ovale)    small by cardiac CTA 01/2019  . PONV (postoperative nausea and vomiting)   . Restless legs syndrome   . Squamous cell carcinoma of skin 12/202/2017   right distal dorsum lateral forearm    Past Surgical History:  Procedure Laterality Date  . ABDOMINOPLASTY    . BACK SURGERY     1 laminectomy, 1 fusion  . COLONOSCOPY    . COLONOSCOPY WITH PROPOFOL N/A 01/17/2017   Procedure: COLONOSCOPY WITH PROPOFOL;  Surgeon: Lollie Sails, MD;  Location: [facility] ENDOSCOPY;  Service: Endoscopy;  Laterality: N/A;  . WISDOM TOOTH EXTRACTION      There were no vitals filed for this visit.   Subjective Assessment - 04/29/20 1532    Subjective Patient reports having a good but stressful holiday season. Was aware of her limitations and  rested when needed but still feeling stiff  from the activities.    Pertinent History Patient returning to PT after C6-7 arthroplasty with spur removal and discectomy on 12/06/19.  After one week started trying to quilt and had bad pain in her neck. Ever since surgery had pain in L side and shoulder with arm to forearm; can't sleep on L side right now. Is on a steroid taper now for reduction of pain. Was not given a protocol but was told no lifting more than 10lb, no cervical extension. Reports the surgeon wants to be conservative with treatment at the beginning. PMH of hyperglycemia, elevated BP, arthritis, HLD, restless legs, chronic insomnia, prior vertiginous migraine, thalamic CVA, anxiety, CAD, depression, GERD, hx of cancer, IBS, lumbar disc disease (prior back surgery with fusion x2 L4-5, S1), and cervical disc disease. 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    Limitations Sitting;Lifting;Standing;House hold activities;Reading    How long can you sit comfortably? neck pain limits sitting position    How long can you stand comfortably? n/a    How long can you walk comfortably? 2 miles    Patient Stated Goals decrease pain, improve ROM    Currently  in Pain? Yes    Pain Score 1     Pain Location Neck    Pain Orientation Lower    Pain Descriptors / Indicators Aching;Tightness    Pain Type Chronic pain    Pain Onset More than a month ago    Pain Frequency Intermittent              Goals:  VAS: 3/10  FOTO 54 % NDI: 34%  Cervical ROM     Right Left  Flexion WFL  Extension Limited by 5%   Side Bending 11 12  Rotation 39 WFL   Treatment:     supine: use of heat pad under upper thoracic lower cervical region Cervical sidebend with gentle overpressure 3x30 second holds each direction  cervical rotation with gentle overpressure 3x 30 second holds each direction Suboccipital release 3x30 second holds   Prone: Grade  II-III mobilizations to thoracic spine with reduced hypomobility with repetition4x 10 seconds each level J mobilization to upper thoracic/lower cervical for reduced thoracic kyphosis. 3x30 seconds Inferio-medial rib glides L grade II-III 6x 10 seconds in varying planes of motion for reduction of tension and improve posture; hypomobile and tender initially in rib 6 and 7. STM to upper trap, levator scap, cervical paraspinals x65mnutes with implementation of effleurage and ptrissage for muscle tissue tension reduction.     Pt educated throughout session about proper posture and technique with exercises. Improved exercise technique, movement at target joints, use of target muscles after min to mod verbal, visual, tactile cues.                       PT Education - 04/29/20 1534    Education Details goals, POC, recert    Person(s) Educated Patient    Methods Explanation;Demonstration;Tactile cues;Verbal cues    Comprehension Verbalized understanding;Returned demonstration;Verbal cues required;Tactile cues required            PT Short Term Goals - 03/03/20 1416      PT SHORT TERM GOAL #1   Title Patient will be independent in home exercise program to improve strength/mobility/pain relief for better functional independence with ADLs.    Baseline 8/11; HEP given 10/5: HEP compliant    Time 4    Period Weeks    Status Achieved    Target Date 02/05/20             PT Long Term Goals - 04/30/20 1246      PT LONG TERM GOAL #1   Title Patient will report a worst pain of 3/10 on VAS in cervical spine to improve tolerance with ADLs and reduced symptoms with activities.    Baseline 8/11: 8/10 10/5: 5/10 11/8: 4/10 12/2: 3/10    Time 8    Period Weeks    Status Achieved      PT LONG TERM GOAL #2   Title Patient will increase FOTO score to equal to or greater than  50/100   to demonstrate statistically significant improvement in mobility and quality of life.     Baseline 8/11: 40% 10/5: 10/5: 53% 11/8: 55% 12/2: 54%    Time 8    Period Weeks    Status Achieved      PT LONG TERM GOAL #3   Title Patient will reduce Neck Disability Index score to <10% to demonstrate minimal disability with ADL's including improved sleeping tolerance, sitting tolerance, etc for better mobility at home and work.    Baseline 8/11: 62%  10/5: 38% 11/8: 40% 12/2:34%    Time 8    Period Weeks    Status Partially Met    Target Date 06/24/20      PT LONG TERM GOAL #4   Title Patient will increase cervical AROM within 10 degrees of functional range for improved function, ability to drive, and return to PLOF.    Baseline 8/11: extension deferred rotation: R 28 L 40, SB R 10 L 4 10/5: see note 12/2: see note    Time 8    Period Weeks    Status Partially Met    Target Date 06/24/20      PT LONG TERM GOAL #5   Title Patient will demonstrate decreased frequency of higher pain episodes to improve quality of life and demonstrate improved self maintanance.    Baseline 12/1: frequent raises of pain after work    Time 8    Period Weeks    Status New    Target Date 06/24/20                 Plan - 04/29/20 1608    Clinical Impression Statement Patient is progressing towards functional goals with decreased pain, improved ROM, and improved function. However due to patient's high level of PLOF she will benefit form one more cert of decreased frequency of 1x/week. Cervical ROM continues to improve with tension and posture remaining primary issue at this time. Patient would benefit from skilled physical therapy to reduce pain, improve muscle tissue length and posture, and return patient to PLOF    Personal Factors and Comorbidities Age;Comorbidity 3+;Past/Current Experience;Profession;Social Background;Time since onset of injury/illness/exacerbation    Comorbidities hyperglycemia, elevated BP, arthritis, HLD, restless legs, chronic insomnia, prior vertiginous migraine, thalamic  CVA, anxiety, CAD, depression, GERD, hx of cancer, IBS, lumbar disc disease (prior back surgery with fusion x2 L4-5, S1), and cervical disc disease.    Examination-Activity Limitations Bed Mobility;Bend;Caring for Others;Carry;Dressing;Sleep;Reach Overhead;Lift;Hygiene/Grooming;Stairs;Bathing;Sit    Examination-Participation Restrictions Cleaning;Community Activity;Driving;Interpersonal Relationship;Laundry;Volunteer;Meal Prep;Yard Work;Other    Stability/Clinical Decision Making Evolving/Moderate complexity    Rehab Potential Fair    PT Frequency 1x / week    PT Duration 8 weeks    PT Treatment/Interventions ADLs/Self Care Home Management;Biofeedback;Cryotherapy;Electrical Stimulation;Iontophoresis 44m/ml Dexamethasone;Moist Heat;Traction;Ultrasound;DME Instruction;Functional mobility training;Therapeutic activities;Patient/family education;Neuromuscular re-education;Therapeutic exercise;Manual techniques;Energy conservation;Dry needling;Passive range of motion;Taping;Vestibular;Vasopneumatic Device;Visual/perceptual remediation/compensation;Canalith Repostioning;Balance training    PT Next Visit Plan recert. goals    PT Home Exercise Plan median and ulnar modified nerve glides    Consulted and Agree with Plan of Care Patient           Patient will benefit from skilled therapeutic intervention in order to improve the following deficits and impairments:  Decreased activity tolerance, Decreased mobility, Decreased range of motion, Decreased strength, Hypomobility, Impaired flexibility, Impaired perceived functional ability, Increased muscle spasms, Postural dysfunction, Improper body mechanics, Pain, Decreased knowledge of precautions, Decreased scar mobility  Visit Diagnosis: Cervicalgia  Radiculopathy, cervical region  Abnormal posture  Muscle weakness (generalized)     Problem List Patient Active Problem List   Diagnosis Date Noted  . PFO (patent foramen ovale)   . CAD (coronary  artery disease), native coronary artery   . [place]  . Depression 10/16/2017  . Migraine 10/16/2017  . Hyperglycemia, unspecified 07/10/2017  . Hyperglycemia 07/10/2017  . H/O dizziness 08/02/2016  . Cervical disc disorder with radiculopathy of cervical region 04/12/2016  . Nonallopathic lesion of thoracic region 04/12/2016  . Nonallopathic lesion of rib cage  04/12/2016  . Facet arthritis of cervical region 03/21/2016  . Hyperlipidemia 01/26/2015  . Labral tear of shoulder, right, subsequent encounter 12/04/2014  . 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 Impingement syndrome of right shoulder 05/28/2014  . Right supraspinatus tenosynovitis 05/28/2014  . Arthritis pain 04/03/2014  . Fatigue 04/03/2014  . 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 IBS (irritable bowel syndrome) 06/20/2012  . Lumbar disc disease 06/20/2012   Janna Arch, PT, DPT   04/30/2020, 12:57 PM  [place] MAIN [facility] SERVICES [address] [place], [place], [address] Phone: [phone]   Fax:  [phone]  Name: Lori Morales MRN: [id number] Date of Birth: 12/20/1960

## 2020-05-04 ENCOUNTER — Ambulatory Visit: Payer: 59

## 2020-05-04 ENCOUNTER — Encounter: Payer: Self-pay | Admitting: Dermatology

## 2020-05-08 ENCOUNTER — Other Ambulatory Visit: Payer: Self-pay

## 2020-05-08 ENCOUNTER — Ambulatory Visit
Admission: RE | Admit: 2020-05-08 | Discharge: 2020-05-08 | Disposition: A | Discharge status: Home / Self Care | Payer: 59 | Source: Ambulatory Visit | Attending: Obstetrics and Gynecology | Admitting: Obstetrics and Gynecology

## 2020-05-08 DIAGNOSIS — Z1231 Encounter for screening mammogram for malignant neoplasm of breast: Secondary | ICD-10-CM | POA: Insufficient documentation

## 2020-05-11 ENCOUNTER — Ambulatory Visit: Payer: 59

## 2020-05-11 ENCOUNTER — Other Ambulatory Visit: Payer: Self-pay

## 2020-05-11 DIAGNOSIS — M6281 Muscle weakness (generalized): Secondary | ICD-10-CM | POA: Diagnosis not present

## 2020-05-11 DIAGNOSIS — M5440 Lumbago with sciatica, unspecified side: Secondary | ICD-10-CM

## 2020-05-11 DIAGNOSIS — M5412 Radiculopathy, cervical region: Secondary | ICD-10-CM

## 2020-05-11 DIAGNOSIS — M542 Cervicalgia: Secondary | ICD-10-CM

## 2020-05-11 DIAGNOSIS — R278 Other lack of coordination: Secondary | ICD-10-CM

## 2020-05-11 DIAGNOSIS — R293 Abnormal posture: Secondary | ICD-10-CM

## 2020-05-11 NOTE — Therapy (Signed)
[place] MAIN [facility] SERVICES [address] [place], [place], [address] Phone: [phone]   Fax:  [phone]  Physical Therapy Treatment  Patient Details  Name: Lori Morales MRN: [id number] Date of Birth: [birth year] Referring Provider (PT): Dr. Earleen Newport    Encounter Date: 05/11/2020   PT End of Session - 05/11/20 0927    Visit Number 27    Number of Visits 34    Date for PT Re-Evaluation 06/24/20    PT Start Time 0846    PT Stop Time 0926    PT Time Calculation (min) 40 min    Activity Tolerance Patient tolerated treatment well;No increased pain    Behavior During Therapy WFL for tasks assessed/performed           Past Medical History:  Diagnosis Date  . Arthritis   . CAD (coronary artery disease), native coronary artery    mild non calcified plaque in mid LAD and prox LCx on coronary CTA 01/2019  . Depression   . GERD (gastroesophageal reflux disease)   . Headache    migraines  . Hx of squamous cell carcinoma 07/11/2016   Right distal dorsum lat forearm   . Hypertension   . IBS (irritable bowel syndrome)   . Palpitations   . PFO (patent foramen ovale)    small by cardiac CTA 01/2019  . PONV (postoperative nausea and vomiting)   . Restless legs syndrome   . Squamous cell carcinoma of skin 12/202/2017   right distal dorsum lateral forearm    Past Surgical History:  Procedure Laterality Date  . ABDOMINOPLASTY    . BACK SURGERY     1 laminectomy, 1 fusion  . COLONOSCOPY    . COLONOSCOPY WITH PROPOFOL N/A 01/17/2017   Procedure: COLONOSCOPY WITH PROPOFOL;  Surgeon: Lollie Sails, MD;  Location: [facility] ENDOSCOPY;  Service: Endoscopy;  Laterality: N/A;  . WISDOM TOOTH EXTRACTION      There were no vitals filed for this visit.   Subjective Assessment - 05/11/20 0848    Subjective Pt still having pain in left lateral upper arm. Pt reports doing well today. Pt denies any updates to medications or medical  appointment since prior session. Pt reports good compliance with HEP when time permits. Thus far pt continues to notice improvements with ease of ADL task performance.       Pertinent History Patient returning to PT after C6-7 arthroplasty with spur removal and discectomy on 12/06/19.  After one week started trying to quilt and had bad pain in her neck. Ever since surgery had pain in L side and shoulder with arm to forearm; can't sleep on L side right now. Is on a steroid taper now for reduction of pain. Was not given a protocol but was told no lifting more than 10lb, no cervical extension. Reports the surgeon wants to be conservative with treatment at the beginning. PMH of hyperglycemia, elevated BP, arthritis, HLD, restless legs, chronic insomnia, prior vertiginous migraine, thalamic CVA, anxiety, CAD, depression, GERD, hx of cancer, IBS, lumbar disc disease (prior back surgery with fusion x2 L4-5, S1), and cervical disc disease. 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    Currently in Pain? Yes    Pain Score 2     Pain Location --   Left lateral deltoid         INTERVENTION THIS DATE: -MFR, sustained release, ART to  bilat supoccipitals, Rt pec minor, left deltoid, Left upper trap, cervical extensors -Cervical rotation stretching, lateral flexion, subccipital, pec minor 3x30sec bilat each  -moist heat applies concurrently.  -Rt lateral flexion 30 degrees, Left lateral flexion 21 degrees      PT Short Term Goals - 03/03/20 1416      PT SHORT TERM GOAL #1   Title Patient will be independent in home exercise program to improve strength/mobility/pain relief for better functional independence with ADLs.    Baseline 8/11; HEP given 10/5: HEP compliant    Time 4    Period Weeks    Status Achieved    Target Date 02/05/20             PT Long Term Goals - 04/30/20 1246      PT LONG TERM GOAL #1   Title Patient will report a worst  pain of 3/10 on VAS in cervical spine to improve tolerance with ADLs and reduced symptoms with activities.    Baseline 8/11: 8/10 10/5: 5/10 11/8: 4/10 12/2: 3/10    Time 8    Period Weeks    Status Achieved      PT LONG TERM GOAL #2   Title Patient will increase FOTO score to equal to or greater than  50/100   to demonstrate statistically significant improvement in mobility and quality of life.    Baseline 8/11: 40% 10/5: 10/5: 53% 11/8: 55% 12/2: 54%    Time 8    Period Weeks    Status Achieved      PT LONG TERM GOAL #3   Title Patient will reduce Neck Disability Index score to <10% to demonstrate minimal disability with ADL's including improved sleeping tolerance, sitting tolerance, etc for better mobility at home and work.    Baseline 8/11: 62% 10/5: 38% 11/8: 40% 12/2:34%    Time 8    Period Weeks    Status Partially Met    Target Date 06/24/20      PT LONG TERM GOAL #4   Title Patient will increase cervical AROM within 10 degrees of functional range for improved function, ability to drive, and return to PLOF.    Baseline 8/11: extension deferred rotation: R 28 L 40, SB R 10 L 4 10/5: see note 12/2: see note    Time 8    Period Weeks    Status Partially Met    Target Date 06/24/20      PT LONG TERM GOAL #5   Title Patient will demonstrate decreased frequency of higher pain episodes to improve quality of life and demonstrate improved self maintanance.    Baseline 12/1: frequent raises of pain after work    Time 8    Period Weeks    Status New    Target Date 06/24/20                 Plan - 05/11/20 7412    Clinical Impression Statement Continued with current plan of care as laid out in evaluation and recent prior sessions. All interventions and precautions maintained as indicated by postoperative protocol per referring provider. Author continues to advance interventions from single plane to multiplane whenever appropriate in order to maximize best carryover to daily  functional tasks. Pt remains motivated to advance progress toward goals. Author maintains all interventions within appropriate level of intensity as not to purposefully exacerbate pain. Pt does require varying levels of assistance and cuing for completion of exercises for correct form and sometimes due  to pain/weakness. ROM remains limited, although gradual improvement is apparent. Pt continues to demonstrate progress toward goals AEB progression of some interventions this date either in volume or intensity. No updates to HEP this date.    Comorbidities hyperglycemia, elevated BP, arthritis, HLD, restless legs, chronic insomnia, prior vertiginous migraine, thalamic CVA, anxiety, CAD, depression, GERD, hx of cancer, IBS, lumbar disc disease (prior back surgery with fusion x2 L4-5, S1), and cervical disc disease.    Examination-Activity Limitations Bed Mobility;Bend;Caring for Others;Carry;Dressing;Sleep;Reach Overhead;Lift;Hygiene/Grooming;Stairs;Bathing;Sit    Examination-Participation Restrictions Cleaning;Community Activity;Driving;Interpersonal Relationship;Laundry;Volunteer;Meal Prep;Yard Work;Other    Stability/Clinical Decision Making Evolving/Moderate complexity    Clinical Decision Making Moderate    Rehab Potential Fair    PT Frequency 1x / week    PT Duration 8 weeks    PT Treatment/Interventions ADLs/Self Care Home Management;Biofeedback;Cryotherapy;Electrical Stimulation;Iontophoresis 43m/ml Dexamethasone;Moist Heat;Traction;Ultrasound;DME Instruction;Functional mobility training;Therapeutic activities;Patient/family education;Neuromuscular re-education;Therapeutic exercise;Manual techniques;Energy conservation;Dry needling;Passive range of motion;Taping;Vestibular;Vasopneumatic Device;Visual/perceptual remediation/compensation;Canalith Repostioning;Balance training    PT Next Visit Plan Consider more suboccipital specific stretches for HEP; pec minor stretching; upper thoracic spine stretching  for HEP    PT Home Exercise Plan median and ulnar modified nerve glides    Consulted and Agree with Plan of Care Patient           Patient will benefit from skilled therapeutic intervention in order to improve the following deficits and impairments:  Decreased activity tolerance,Decreased mobility,Decreased range of motion,Decreased strength,Hypomobility,Impaired flexibility,Impaired perceived functional ability,Increased muscle spasms,Postural dysfunction,Improper body mechanics,Pain,Decreased knowledge of precautions,Decreased scar mobility  Visit Diagnosis: Radiculopathy, cervical region  Abnormal posture  Muscle weakness (generalized)  Acute left-sided low back pain with sciatica, sciatica laterality unspecified  Other lack of coordination  Cervicalgia     Problem List Patient Active Problem List   Diagnosis Date Noted  . PFO (patent foramen ovale)   . CAD (coronary artery disease), native coronary artery   . Acute CV[place] 06[place]  . Depression 10/16/2017  . Migraine 10/16/2017  . Hyperglycemia, unspecified 07/10/2017  . Hyperglycemia 07/10/2017  . H/O dizziness 08/02/2016  . Cervical disc disorder with radiculopathy of cervical region 04/12/2016  . Nonallopathic lesion of thoracic region 04/12/2016  . Nonallopathic lesion of rib cage 04/12/2016  . Facet arthritis of cervical region 03/21/2016  . Hyperlipidemia 01/26/2015  . Labral tear of shoulder, right, subsequent encounter 12/04/2014  . 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 Impingement syndrome of right shoulder 05/28/2014  . Right supraspinatus tenosynovitis 05/28/2014  . Arthritis pain 04/03/2014  . Fatigue 04/03/2014  . 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 IBS (irritable bowel syndrome) 06/20/2012  . Lumbar disc disease 06/20/2012   9:33 AM, 05/11/20 AEtta Grandchild PT, DPT Physical Therapist - [phone]    BEtta Grandchild12/13/2021, 9:30 AM  [facility] [address] [place] [address]Phone: [phone]  Fax:  [phone] Name: Lori SHIMIZUMRN: [id number] of Birth: 114-Jul-1962

## 2020-05-13 DIAGNOSIS — M50123 Cervical disc disorder at C6-C7 level with radiculopathy: Secondary | ICD-10-CM | POA: Diagnosis not present

## 2020-05-13 DIAGNOSIS — Z6826 Body mass index (BMI) 26.0-26.9, adult: Secondary | ICD-10-CM | POA: Diagnosis not present

## 2020-05-13 DIAGNOSIS — I10 Essential (primary) hypertension: Secondary | ICD-10-CM | POA: Diagnosis not present

## 2020-05-14 MED FILL — PANTOPRAZOLE SOD DR 40 MG T: 40 | 90 days supply | Qty: 90 | Fill #1

## 2020-05-18 ENCOUNTER — Other Ambulatory Visit: Payer: Self-pay

## 2020-05-18 ENCOUNTER — Ambulatory Visit: Payer: 59

## 2020-05-18 DIAGNOSIS — M542 Cervicalgia: Secondary | ICD-10-CM | POA: Diagnosis not present

## 2020-05-18 DIAGNOSIS — M5412 Radiculopathy, cervical region: Secondary | ICD-10-CM

## 2020-05-18 DIAGNOSIS — M6281 Muscle weakness (generalized): Secondary | ICD-10-CM | POA: Diagnosis not present

## 2020-05-18 DIAGNOSIS — M5440 Lumbago with sciatica, unspecified side: Secondary | ICD-10-CM | POA: Diagnosis not present

## 2020-05-18 DIAGNOSIS — R278 Other lack of coordination: Secondary | ICD-10-CM | POA: Diagnosis not present

## 2020-05-18 DIAGNOSIS — R293 Abnormal posture: Secondary | ICD-10-CM | POA: Diagnosis not present

## 2020-05-18 NOTE — Therapy (Signed)
[place] [facility] [address] [place], [place], [address] Phone: [phone]   Fax:  [phone]  Physical Therapy Treatment  Patient Details  Name: Lori Morales MRN: [id number] Date of Birth: 1960-08-17 Referring Provider (PT): Dr. Earleen Newport    Encounter Date: 05/18/2020   PT End of Session - 05/18/20 1308    Visit Number 28    Number of Visits 34    Date for PT Re-Evaluation 06/24/20    Authorization Type 8/10 PN 11/8    PT Start Time 0847    PT Stop Time 0932    PT Time Calculation (min) 45 min    Activity Tolerance Patient tolerated treatment well;No increased pain    Behavior During Therapy WFL for tasks assessed/performed           Past Medical History:  Diagnosis Date  . Arthritis   . CAD (coronary artery disease), native coronary artery    mild non calcified plaque in mid LAD and prox LCx on coronary CTA 01/2019  . Depression   . GERD (gastroesophageal reflux disease)   . Headache    migraines  . Hx of squamous cell carcinoma 07/11/2016   Right distal dorsum lat forearm   . Hypertension   . IBS (irritable bowel syndrome)   . Palpitations   . PFO (patent foramen ovale)    small by cardiac CTA 01/2019  . PONV (postoperative nausea and vomiting)   . Restless legs syndrome   . Squamous cell carcinoma of skin 12/202/2017   right distal dorsum lateral forearm    Past Surgical History:  Procedure Laterality Date  . ABDOMINOPLASTY    . BACK SURGERY     1 laminectomy, 1 fusion  . COLONOSCOPY    . COLONOSCOPY WITH PROPOFOL N/A 01/17/2017   Procedure: COLONOSCOPY WITH PROPOFOL;  Surgeon: Lollie Sails, MD;  Location: [facility] ENDOSCOPY;  Service: Endoscopy;  Laterality: N/A;  . WISDOM TOOTH EXTRACTION      There were no vitals filed for this visit.   Subjective Assessment - 05/18/20 1307    Subjective Patient reports she met with her surgeon who is pleased with her progress. Is having a flare up  with some radiating symptoms.    Pertinent History Patient returning to PT after C6-7 arthroplasty with spur removal and discectomy on 12/06/19.  After one week started trying to quilt and had bad pain in her neck. Ever since surgery had pain in L side and shoulder with arm to forearm; can't sleep on L side right now. Is on a steroid taper now for reduction of pain. Was not given a protocol but was told no lifting more than 10lb, no cervical extension. Reports the surgeon wants to be conservative with treatment at the beginning. PMH of hyperglycemia, elevated BP, arthritis, HLD, restless legs, chronic insomnia, prior vertiginous migraine, thalamic CVA, anxiety, CAD, depression, GERD, hx of cancer, IBS, lumbar disc disease (prior back surgery with fusion x2 L4-5, S1), and cervical disc disease. 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    Patient Stated Goals decrease pain, improve ROM    Currently in Pain? Yes    Pain Score 2     Pain Location Neck    Pain Orientation Lateral    Pain Descriptors / Indicators Aching;Tightness    Pain Type Neuropathic pain;Surgical pain    Pain Radiating Towards through L bicep into  wrist    Pain Frequency Intermittent                Access Code: [email or web address] URL: [email or web address] Date: 05/18/2020 Prepared by: Janna Arch  Exercises Anterior Shoulder and Biceps Stretch - 1 x daily - 7 x weekly - 2 sets - 2 reps - 60 hold Bicep Stretch at Table - 1 x daily - 7 x weekly - 2 sets - 2 reps - 60 hold     supine: use of heat pad under upper thoracic lower cervical region Cervical sidebend with gentle overpressure 3x30 second holds each direction  cervical rotation with gentle overpressure 3x 30 second holds each direction Suboccipital release 3x30 second holds  Cervical AROM extension 10x 3 second hold ; use of rolled up towel under neck for pain reduction; added change of robber  stretch  Median nerve glides 15x each UE (more limited on the L), ulnar nerve glide 15x each UE  chin tucks 15x 5 second holds with scapular retraction  Bicep trigger point release with movement x 6 minutes  RTB ER with scapular retraction 10x RTB overhead flexion 10x    Prone:  Grade II-III mobilizations to thoracic spine with reduced hypomobility with repetition 4x 10 seconds each level J mobilization to upper thoracic/lower cervical for reduced thoracic kyphosis. 3x30 seconds  Inferio-medial rib glides L grade II-III 6x 10 seconds in varying planes of motion for reduction of tension and improve posture; hypomobile and tender initially in rib 6 and 7.     Standing: Bicep lengthening stretch on table. 60 seconds x 2 trials   Seated  bicep release trigger point release with flexion/extension and supination/pronation for self release   STM to upper trap, levator scap, cervical paraspinals x9 minutes with implementation of effleurage and ptrissage for muscle tissue tension reduction.   Pt educated throughout session about proper posture and technique with exercises. Improved exercise technique, movement at target joints, use of target muscles after min to mod verbal, visual, tactile cues.                     PT Education - 05/18/20 1308    Education Details exercise technique, body mechanics    Person(s) Educated Patient    Methods Explanation;Demonstration;Tactile cues;Verbal cues    Comprehension Verbalized understanding;Returned demonstration;Verbal cues required;Tactile cues required            PT Short Term Goals - 03/03/20 1416      PT SHORT TERM GOAL #1   Title Patient will be independent in home exercise program to improve strength/mobility/pain relief for better functional independence with ADLs.    Baseline 8/11; HEP given 10/5: HEP compliant    Time 4    Period Weeks    Status Achieved    Target Date 02/05/20             PT Long Term Goals -  04/30/20 1246      PT LONG TERM GOAL #1   Title Patient will report a worst pain of 3/10 on VAS in cervical spine to improve tolerance with ADLs and reduced symptoms with activities.    Baseline 8/11: 8/10 10/5: 5/10 11/8: 4/10 12/2: 3/10    Time 8    Period Weeks    Status Achieved      PT LONG TERM GOAL #2   Title Patient will increase FOTO score to equal to or greater than  50/100   to demonstrate statistically significant improvement in mobility  and quality of life.    Baseline 8/11: 40% 10/5: 10/5: 53% 11/8: 55% 12/2: 54%    Time 8    Period Weeks    Status Achieved      PT LONG TERM GOAL #3   Title Patient will reduce Neck Disability Index score to <10% to demonstrate minimal disability with ADL's including improved sleeping tolerance, sitting tolerance, etc for better mobility at home and work.    Baseline 8/11: 62% 10/5: 38% 11/8: 40% 12/2:34%    Time 8    Period Weeks    Status Partially Met    Target Date 06/24/20      PT LONG TERM GOAL #4   Title Patient will increase cervical AROM within 10 degrees of functional range for improved function, ability to drive, and return to PLOF.    Baseline 8/11: extension deferred rotation: R 28 L 40, SB R 10 L 4 10/5: see note 12/2: see note    Time 8    Period Weeks    Status Partially Met    Target Date 06/24/20      PT LONG TERM GOAL #5   Title Patient will demonstrate decreased frequency of higher pain episodes to improve quality of life and demonstrate improved self maintanance.    Baseline 12/1: frequent raises of pain after work    Time 8    Period Weeks    Status New    Target Date 06/24/20                 Plan - 05/18/20 1313    Clinical Impression Statement Patient presents with flare up of pain initially with primary complaint being stiffness and radiating symptoms that are resolved by end of session. Patient is progressing with ability to notice symptom onset and is getting better about being able to self  correct. A few more sessions for patient to return to full PLOF will be beneficial as patient nears discharge. Patient would benefit from skilled physical therapy to reduce pain, improve muscle tissue length and posture, and return patient to PLOF    Personal Factors and Comorbidities Age;Comorbidity 3+;Past/Current Experience;Profession;Social Background;Time since onset of injury/illness/exacerbation    Comorbidities hyperglycemia, elevated BP, arthritis, HLD, restless legs, chronic insomnia, prior vertiginous migraine, thalamic CVA, anxiety, CAD, depression, GERD, hx of cancer, IBS, lumbar disc disease (prior back surgery with fusion x2 L4-5, S1), and cervical disc disease.    Examination-Activity Limitations Bed Mobility;Bend;Caring for Others;Carry;Dressing;Sleep;Reach Overhead;Lift;Hygiene/Grooming;Stairs;Bathing;Sit    Examination-Participation Restrictions Cleaning;Community Activity;Driving;Interpersonal Relationship;Laundry;Volunteer;Meal Prep;Yard Work;Other    Stability/Clinical Decision Making Evolving/Moderate complexity    Rehab Potential Fair    PT Frequency 1x / week    PT Duration 8 weeks    PT Treatment/Interventions ADLs/Self Care Home Management;Biofeedback;Cryotherapy;Electrical Stimulation;Iontophoresis 39m/ml Dexamethasone;Moist Heat;Traction;Ultrasound;DME Instruction;Functional mobility training;Therapeutic activities;Patient/family education;Neuromuscular re-education;Therapeutic exercise;Manual techniques;Energy conservation;Dry needling;Passive range of motion;Taping;Vestibular;Vasopneumatic Device;Visual/perceptual remediation/compensation;Canalith Repostioning;Balance training    PT Next Visit Plan recert. goals    PT Home Exercise Plan median and ulnar modified nerve glides    Consulted and Agree with Plan of Care Patient           Patient will benefit from skilled therapeutic intervention in order to improve the following deficits and impairments:  Decreased  activity tolerance,Decreased mobility,Decreased range of motion,Decreased strength,Hypomobility,Impaired flexibility,Impaired perceived functional ability,Increased muscle spasms,Postural dysfunction,Improper body mechanics,Pain,Decreased knowledge of precautions,Decreased scar mobility  Visit Diagnosis: Radiculopathy, cervical region  Abnormal posture  Muscle weakness (generalized)     Problem List Patient Active Problem List  Diagnosis Date Noted  . PFO (patent foramen ovale)   . CAD (coronary artery disease), native coronary artery   . Acute CVA (cerebrovascular accident) ([place]) 06/15/2018  . Depression 10/16/2017  . Migraine 10/16/2017  . Hyperglycemia, unspecified 07/10/2017  . Hyperglycemia 07/10/2017  . H/O dizziness 08/02/2016  . Cervical disc disorder with radiculopathy of cervical region 04/12/2016  . Nonallopathic lesion of thoracic region 04/12/2016  . Nonallopathic lesion of rib cage 04/12/2016  . Facet arthritis of cervical region 03/21/2016  . Hyperlipidemia 01/26/2015  . Labral tear of shoulder, right, subsequent encounter 12/04/2014  . 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 Impingement syndrome of right shoulder 05/28/2014  . Right supraspinatus tenosynovitis 05/28/2014  . Arthritis pain 04/03/2014  . Fatigue 04/03/2014  . 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 IBS (irritable bowel syndrome) 06/20/2012  . Lumbar disc disease 06/20/2012   Janna Arch, PT, DPT   05/18/2020, 1:14 PM  [place] [facility] [address] [place], [place], [address] Phone: [phone]   Fax:  [phone]  Name: YALEXA BLUST MRN: [id number] Date of Birth: Oct 29, 1960

## 2020-07-08 ENCOUNTER — Other Ambulatory Visit: Payer: Self-pay | Admitting: Internal Medicine

## 2020-07-28 ENCOUNTER — Ambulatory Visit: Payer: 59 | Attending: Internal Medicine

## 2020-07-28 ENCOUNTER — Other Ambulatory Visit: Payer: Self-pay

## 2020-07-28 DIAGNOSIS — M6281 Muscle weakness (generalized): Secondary | ICD-10-CM | POA: Insufficient documentation

## 2020-07-28 DIAGNOSIS — M542 Cervicalgia: Secondary | ICD-10-CM

## 2020-07-28 NOTE — Therapy (Signed)
[place] [facility] [address] [place], [place], [address] Phone: [phone]   Fax:  [phone]  Patient Details  Name: Lori Morales MRN: [id number] Date of Birth: [birth year] Referring Provider:  Adin Hector, MD  Encounter Date: 07/28/2020  PT/OT/SLP Screening Form   Time: in_1506     Time out_1533_   Complaint ___cervical neck pain radiating to L shoulder Past Medical Hx:  _C6-7 arthorplasty with spur removal and discectomy 12/06/19 Injury Date:__2/28/22  Pain Scale: __9/10  Patient's phone number:   Hx (this occurrence):  Patient had a cold over the weekend but had no pain in neck or shoulder. 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 Pain progressively worsened and then patient woke up at 2 am with worse pain (presurgery pain levels) and not able to move head without severe pain.     Assessment:      Right Left  Flexion Limited and painful  Extension Limited and painful  Side Bending Painful limited >25% Painful limited >25%  Rotation Painful limited >25% Painful limited >25%    Palpation: multiple trigger points and high muscle guarding of L periscapular musculature and spinal musculature.   Cervical mobilization: UPA to C6 grade I results in jump off of table severe pain, non painful grade I to C2-3, and Thoracic.   Recommendations:    Comments:Patient presents with high pain levels, limited mobility, and high muscle guarding. Due to pain being focal to surgical site and similar to presurgery pain patient is highly recommended to seek MD referral, obtain imaging, file a safety zone portal, and seek further direction from health at work.    [x]  Patient would benefit from an MD referral []  Patient would benefit from a full PT/OT/ SLP evaluation and treatment. []  No intervention recommended at this time.  Janna Arch, PT,  DPT   07/28/2020, 3:46 PM  [place] MAIN [facility] [address] [place], [place], [address] Phone: [phone]   Fax:  [phone]

## 2020-08-11 ENCOUNTER — Other Ambulatory Visit: Payer: Self-pay | Admitting: Internal Medicine

## 2020-08-14 ENCOUNTER — Other Ambulatory Visit: Payer: Self-pay

## 2020-08-14 ENCOUNTER — Ambulatory Visit: Payer: PRIVATE HEALTH INSURANCE | Attending: Physician Assistant

## 2020-08-14 DIAGNOSIS — M6281 Muscle weakness (generalized): Secondary | ICD-10-CM | POA: Insufficient documentation

## 2020-08-14 DIAGNOSIS — M542 Cervicalgia: Secondary | ICD-10-CM | POA: Insufficient documentation

## 2020-08-14 DIAGNOSIS — M5412 Radiculopathy, cervical region: Secondary | ICD-10-CM | POA: Diagnosis present

## 2020-08-14 NOTE — Therapy (Signed)
[place] MAIN [facility] SERVICES [address] [place], [place], [address] Phone: [phone]   Fax:  [phone]  Physical Therapy Treatment  Patient Details  Name: Lori Morales MRN: [id number] Date of Birth: 1961/01/06 Referring Provider (PT): Lily Kocher, [place]   Encounter Date: 08/14/2020   PT End of Session - 08/14/20 1523    Visit Number 1    Number of Visits 25    Date for PT Re-Evaluation 11/06/20    Authorization Type Initial cert: 9/79/4801- 6/55/3748    PT Start Time 0804    PT Stop Time 0859    PT Time Calculation (min) 55 min    Activity Tolerance Patient limited by pain    Behavior During Therapy [facility] for tasks assessed/performed           Past Medical History:  Diagnosis Date  . Arthritis   . CAD (coronary artery disease), native coronary artery    mild non calcified plaque in mid LAD and prox LCx on coronary CTA 01/2019  . Depression   . GERD (gastroesophageal reflux disease)   . Headache    migraines  . Hx of squamous cell carcinoma 07/11/2016   Right distal dorsum lat forearm   . Hypertension   . IBS (irritable bowel syndrome)   . Palpitations   . PFO (patent foramen ovale)    small by cardiac CTA 01/2019  . PONV (postoperative nausea and vomiting)   . Restless legs syndrome   . Squamous cell carcinoma of skin 12/202/2017   right distal dorsum lateral forearm    Past Surgical History:  Procedure Laterality Date  . ABDOMINOPLASTY    . BACK SURGERY     1 laminectomy, 1 fusion  . COLONOSCOPY    . COLONOSCOPY WITH PROPOFOL N/A 01/17/2017   Procedure: COLONOSCOPY WITH PROPOFOL;  Surgeon: Lollie Sails, MD;  Location: [facility] ENDOSCOPY;  Service: Endoscopy;  Laterality: N/A;  . WISDOM TOOTH EXTRACTION      There were no vitals filed for this visit.   Subjective Assessment - 08/14/20 1456    Subjective Patient reports return to outpatient PT due to recent injury while working. 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  She reports her pain levels were up to  9/10 even on the date of her PT screening but today reports ongoing pain yet not as severe- rates as constant ache- 4/10 with difficulty moving her head and neck and difficulty sleeping. Patient has referral from Lily Kocher, PA-C through workers comp with diagnosis of left Upper trapezius strain.    Pertinent History Patient has PMH: significant for C6-7 arthroplasty. PMH significant for arthritis, depression,IBS, Squamous cell carcinoma, Hypertension.    Limitations Lifting;Writing;House hold activities;Other (comment)   Patient reports 10lb lifting restriction.   How long can you sit comfortably? Increased pain if performing deskwork for > 1/2 day.    How long can you stand comfortably? No issues    How long can you walk comfortably? No issues    Diagnostic tests None    Patient Stated Goals To be painfree and work without pain or restrictions as well as sleep better.    Currently in Pain? Yes    Pain Score 4     Pain Location Neck    Pain Orientation Left;Posterior    Pain Descriptors / Indicators Aching  Pain Type Acute pain    Pain Radiating Towards neck pain into B upper traps (left significantly worse than right)    Pain Onset 1 to 4 weeks ago    Pain Frequency Constant    Aggravating Factors  desk work, any lifting or use of left UE.    Pain Relieving Factors rest    Effect of Pain on Daily Activities Unable to lift objects with left hand and difficulty sleeping due to pain.    Multiple Pain Sites No             OBJECTIVE  Mental Status Patient is oriented to person, place and time.  Recent memory is intact.  Remote memory is intact.  Attention span and concentration are intact.  Expressive speech is intact.  Patient's fund of knowledge is within normal limits for educational  level.  SENSATION: Grossly intact to light touch bilateral UE as determined by testing dermatomes C2-T2 Mild decreased sensation with light touch along left sided Upper trap region.  MUSCULOSKELETAL: Tremor: None Bulk: Normal Tone: Normal  Posture:   forward head- slight protracted scapula Patient was guarded/stiff Palpation  Stiff/tightness along enter upper trap bilateral- Left side tighter than right.   Strength R/L 5/4 Shoulder flexion - Limited to 120 deg of left shoulder flex 5/4 Shoulder abduction (deltoid/supraspinatus, axillary/suprascapular n, C5) 5/5 Shoulder external rotation (infraspinatus/teres minor) 5/5 Shoulder internal rotation (subcapularis/lats/pec major) 5/5 Shoulder extension (posterior deltoid, lats, teres major, axillary/thoracodorsal n.) 5/5 Shoulder horizontal abduction 5/5 Elbow flexion (biceps brachii, brachialis, brachioradialis, musculoskeletal n, C5/6) 5/5 Elbow extension (triceps, radial n, C7) 5/5 Wrist Extension (C6/7) 5/5 Wrist Flexion (C6/7) 5/5 Finger adduction (interossei, ulnar n, T1) Cervical isometrics are strong in all directions;  AROM R/L 20 Cervical Flexion* 25 Cervical Extension* 20/10 Cervical Lateral Flexion* 50/42  Cervical Rotation* *Indicates pain, overpressure performed unless otherwise indicated  Also limited Left shoulder flex AROM= 120* pain limited  Repeated Movements No centralization or peripheralization of symptoms with repeated cervical protraction and retraction.        Passive Accessory Intervertebral Motion (PAIVM) Pt  presents with reproduction of neck pain with gentle PA pressure along C6-C7 region     ASSESSMENT Clinical Impression: Pt is a pleasant 60 year-old female referred for neck pain with diagnosis of Left Upper Trapezius strain. PT examination reveals deficits including limited cervical range of motion due to pain with left side more painful than right.  Pt presents with deficits in  Left  UE strength, limited range of motion, and pain. Patient presented today with FOTO Score of 55% and NDI of 54% indicating self perceived limitations. Pt will benefit from skilled PT services to address deficits and return to pain-free function at home and work.   PLAN Next Visit: PROM/Manual Therapy for ROM HEP: Will initiate next visit.               [facility] PT Assessment - 08/14/20 1014      Assessment   Medical Diagnosis Left Upper Trapezius strain    Referring Provider (PT) Lily Kocher, PA-C    Hand Dominance Right    Prior Therapy yes   Patient had outpt. PT for cervical radiculopathy in 2021     Restrictions   Weight Bearing Restrictions No    Other Position/Activity Restrictions yes   No lifting > 5 lb.     Balance Screen   Has the patient fallen in the past 6 months No    Is the patient reluctant to leave  their home because of a fear of falling?  No      Home Ecologist residence      Prior Function   Level of Independence Independent    Vocation Full time employment      Observation/Other Assessments   Focus on Therapeutic Outcomes ([place])  57                                 PT Education - 08/14/20 1522    Education Details Evaluation procedure, Plan of care    Person(s) Educated Patient    Methods Explanation;Demonstration;Verbal cues    Comprehension Verbalized understanding;Returned demonstration;Verbal cues required;Need further instruction            PT Short Term Goals - 08/14/20 1154      PT SHORT TERM GOAL #1   Title Pt will be independent with HEP in order to improve strength and decrease back pain in order to improve pain-free function at home and work.    Baseline 08/13/2020- will need new HEP with new Dx- Has previous HEP for recent Cervical issues- will modify as appropriate.    Time 6    Period Weeks    Status New    Target Date 09/25/20             PT Long Term Goals -  08/14/20 1536      PT LONG TERM GOAL #1   Title Patient will increase FOTO score to equal to or greater than  66/100   to demonstrate statistically significant improvement in mobility and quality of life.    Baseline 08/14/2020: FOTO score=55    Time 12    Period Weeks    Status New    Target Date 11/06/20      PT LONG TERM GOAL #2   Title Pt will demonstrate decrease in NDI by at least 19% in order to demonstrate clinically significant reduction in disability related to neck injury/pain    Baseline 08/14/2020: NDI= 54%    Time 12    Period Weeks    Status New    Target Date 11/06/20      PT LONG TERM GOAL #3   Title Pt will decrease worst neck pain as reported on NPRS by at least 2 points in order to demonstrate clinically significant reduction in back pain.    Baseline 08/14/2020: Patient rates Left sided Neck/UT pain at 4/10 with movement.    Time 12    Period Weeks    Status New    Target Date 11/06/20      PT LONG TERM GOAL #4   Title Patient will demonstrate improved Cervical ROM by 50% in order to improve her functional ability to turn her head or nod for return of functional capabilities including driving, working, and sleeping.    Baseline 08/14/2020= AROM cervical flex= 20deg, ext=25, LF= 10 deg Left/20 deg Right, Rotation= 42 deg L/50 deg R    Time 12    Period Weeks    Status New    Target Date 11/06/20                 Plan - 08/14/20 1632    Clinical Impression Statement Pt is a pleasant 60 year-old female referred for neck pain with diagnosis of Left Upper Trapezius strain. PT examination reveals deficits including limited cervical range of motion due to pain with left  side more painful than right.  Pt presents with deficits in  Left UE strength, limited range of motion, and pain. Patient presented today with FOTO Score of 55% and NDI of 54% indicating self perceived limitations. Pt will benefit from skilled PT services to address deficits and return to pain-free  function at home and work.    Personal Factors and Comorbidities Comorbidity 1;Comorbidity 2;Past/Current Experience;Profession    Comorbidities HTN, Previous neck surgery    Examination-Activity Limitations Bathing;Bed Mobility;Caring for Others;Carry;Dressing;Lift;Reach Overhead;Sleep    Examination-Participation Restrictions Cleaning;Community Activity;Driving;Laundry;Occupation;Yard Work    Stability/Clinical Decision Making Stable/Uncomplicated    Surveyor, mining    Rehab Potential Good    PT Frequency 2x / week    PT Duration 12 weeks    PT Treatment/Interventions ADLs/Self Care Home Management;Cryotherapy;Electrical Stimulation;Moist Heat;Functional mobility training;Therapeutic activities;Therapeutic exercise;Patient/family education;Manual techniques;Passive range of motion;Dry needling;Taping;Spinal Manipulations;Joint Manipulations    PT Next Visit Plan ROM activities, Initiate exercises for HEP    PT Home Exercise Plan To be initiated next visit    Consulted and Agree with Plan of Care Patient           Patient will benefit from skilled therapeutic intervention in order to improve the following deficits and impairments:  Decreased activity tolerance,Decreased endurance,Decreased mobility,Decreased range of motion,Decreased strength,Hypomobility,Impaired UE functional use  Visit Diagnosis: Cervicalgia  Muscle weakness (generalized)     Problem List Patient Active Problem List   Diagnosis Date Noted  . PFO (patent foramen ovale)   . CAD (coronary artery disease), native coronary artery   . Acute CVA (cerebrovascular accident) ([place]) 06/15/2018  . Depression 10/16/2017  . Migraine 10/16/2017  . Hyperglycemia, unspecified 07/10/2017  . Hyperglycemia 07/10/2017  . H/O dizziness 08/02/2016  . Cervical disc disorder with radiculopathy of cervical region 04/12/2016  . Nonallopathic lesion of thoracic region 04/12/2016  . Nonallopathic lesion of rib cage  04/12/2016  . Facet arthritis of cervical region 03/21/2016  . Hyperlipidemia 01/26/2015  . Labral tear of shoulder, right, subsequent encounter 12/04/2014  . 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 Impingement syndrome of right shoulder 05/28/2014  . Right supraspinatus tenosynovitis 05/28/2014  . Arthritis pain 04/03/2014  . Fatigue 04/03/2014  . 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 IBS (irritable bowel syndrome) 06/20/2012  . Lumbar disc disease 06/20/2012    Lewis Moccasin, PT 08/14/2020, 4:40 PM  [place] MAIN [facility] SERVICES [address] [place], [place], [address] Phone: [phone]   Fax:  [phone]  Name: Lori Morales MRN: [id number] Date of Birth: February 28, 1961

## 2020-08-17 DIAGNOSIS — R739 Hyperglycemia, unspecified: Secondary | ICD-10-CM | POA: Diagnosis not present

## 2020-08-17 DIAGNOSIS — E7849 Other hyperlipidemia: Secondary | ICD-10-CM | POA: Diagnosis not present

## 2020-08-17 DIAGNOSIS — M519 Unspecified thoracic, thoracolumbar and lumbosacral intervertebral disc disorder: Secondary | ICD-10-CM | POA: Diagnosis not present

## 2020-08-17 DIAGNOSIS — I2510 Atherosclerotic heart disease of native coronary artery without angina pectoris: Secondary | ICD-10-CM | POA: Diagnosis not present

## 2020-08-18 ENCOUNTER — Other Ambulatory Visit (HOSPITAL_BASED_OUTPATIENT_CLINIC_OR_DEPARTMENT_OTHER): Payer: Self-pay

## 2020-08-19 ENCOUNTER — Other Ambulatory Visit: Payer: Self-pay

## 2020-08-19 ENCOUNTER — Ambulatory Visit: Payer: 59

## 2020-08-19 DIAGNOSIS — M542 Cervicalgia: Secondary | ICD-10-CM

## 2020-08-19 DIAGNOSIS — M6281 Muscle weakness (generalized): Secondary | ICD-10-CM

## 2020-08-19 NOTE — Therapy (Signed)
[place] MAIN [facility] SERVICES [address] [place], [place], [address] Phone: [phone]   Fax:  [phone]  Physical Therapy Treatment  Patient Details  Name: Lori Morales MRN: [id number] Date of Birth: 09-21-1960 Referring Provider (PT): Lily Kocher, [place]   Encounter Date: 08/19/2020   PT End of Session - 08/19/20 1130    Visit Number 2    Number of Visits 25    Date for PT Re-Evaluation 11/06/20    Authorization Type Initial cert: 01/15/2992- 12/12/9676    PT Start Time 0805    PT Stop Time 0850    PT Time Calculation (min) 45 min    Activity Tolerance Patient limited by pain    Behavior During Therapy [facility] for tasks assessed/performed           Past Medical History:  Diagnosis Date  . Arthritis   . CAD (coronary artery disease), native coronary artery    mild non calcified plaque in mid LAD and prox LCx on coronary CTA 01/2019  . Depression   . GERD (gastroesophageal reflux disease)   . Headache    migraines  . Hx of squamous cell carcinoma 07/11/2016   Right distal dorsum lat forearm   . Hypertension   . IBS (irritable bowel syndrome)   . Palpitations   . PFO (patent foramen ovale)    small by cardiac CTA 01/2019  . PONV (postoperative nausea and vomiting)   . Restless legs syndrome   . Squamous cell carcinoma of skin 12/202/2017   right distal dorsum lateral forearm    Past Surgical History:  Procedure Laterality Date  . ABDOMINOPLASTY    . BACK SURGERY     1 laminectomy, 1 fusion  . COLONOSCOPY    . COLONOSCOPY WITH PROPOFOL N/A 01/17/2017   Procedure: COLONOSCOPY WITH PROPOFOL;  Surgeon: Lollie Sails, MD;  Location: [facility] ENDOSCOPY;  Service: Endoscopy;  Laterality: N/A;  . WISDOM TOOTH EXTRACTION      There were no vitals filed for this visit.   Subjective Assessment - 08/19/20 1127    Subjective Patient reports feeling some better overall this week- Rates left Upper trap pain at a 2/10 today.     Pertinent History Patient has PMH: significant for C6-7 arthroplasty. PMH significant for arthritis, depression,IBS, Squamous cell carcinoma, Hypertension.    Limitations Lifting;Writing;House hold activities;Other (comment)   Patient reports 10lb lifting restriction.   How long can you sit comfortably? Increased pain if performing deskwork for > 1/2 day.    How long can you stand comfortably? No issues    How long can you walk comfortably? No issues    Diagnostic tests None    Patient Stated Goals To be painfree and work without pain or restrictions as well as sleep better.    Currently in Pain? Yes    Pain Score 2     Pain Location Neck    Pain Orientation Left;Posterior    Pain Descriptors / Indicators Aching    Pain Type Acute pain    Pain Radiating Towards Left shoulder and mid back    Pain Onset 1 to 4 weeks ago    Pain Frequency Constant    Aggravating Factors  Prolonged sitting, Working, difficulty sleeping.    Multiple Pain Sites No          Pre-treatment Cervical ROM:   AROM R/L *32 Cervical Flexion *33 Cervical Extension *24/*18 Cervical Lateral Flexion *35/*22 Cervical Rotation *Indicates pain, overpressure performed unless  otherwise indicated  Manual cervical distraction hold 30 sec x 4  PROM to cervical lateral flex/Rotation x 8 min total. Patient presents with firm end feel throughout left/right rotation and sidebending- hold 30 sec x 3 sets each.  Soft tissue mobilization- B levator/Upper trap region/rhomboid region In prone- Grade II-III PA thoracic mobs as patient presents with hypomobility. 3x 30 sec   Reviewed cervical ROM/stretching: UT stretch with 30 sec, AROM for cervical rotation. Patient able to demonstrate good technique and no questions regarding these activities today. Instructed her to perform 3-4 times daily with 4 sets of 30 sec hold.    Post treatment measurements: AROM: R/L  35 deg Cervical Flexion  33 deg Cervical Extension *35/*24  Cervical Lateral Flexion *53/*50 Cervical Rotation *Indicates endrange pain     Clinical Impression: Patient performed well with ROM/manual techniques today without report of increased pain. She did experience some end range pain. Reviewed some cervical ROM/stretching today and patient with good working knowledge of exercises to improve her mobility. She did demo improved cervical ROM from pre treatment to post treatment. She will benefit from continued skilled PT services to progress her ROM, Pain relief and assist in returning patient to her previous level of function in home and at work.                        PT Education - 08/19/20 1129    Education Details Review of cervical ROM and stretching.    Person(s) Educated Patient    Methods Explanation;Demonstration;Verbal cues    Comprehension Verbalized understanding;Returned demonstration;Verbal cues required;Need further instruction            PT Short Term Goals - 08/14/20 1154      PT SHORT TERM GOAL #1   Title Pt will be independent with HEP in order to improve strength and decrease back pain in order to improve pain-free function at home and work.    Baseline 08/13/2020- will need new HEP with new Dx- Has previous HEP for recent Cervical issues- will modify as appropriate.    Time 6    Period Weeks    Status New    Target Date 09/25/20             PT Long Term Goals - 08/14/20 1536      PT LONG TERM GOAL #1   Title Patient will increase FOTO score to equal to or greater than  66/100   to demonstrate statistically significant improvement in mobility and quality of life.    Baseline 08/14/2020: FOTO score=55    Time 12    Period Weeks    Status New    Target Date 11/06/20      PT LONG TERM GOAL #2   Title Pt will demonstrate decrease in NDI by at least 19% in order to demonstrate clinically significant reduction in disability related to neck injury/pain    Baseline 08/14/2020: NDI= 54%    Time 12     Period Weeks    Status New    Target Date 11/06/20      PT LONG TERM GOAL #3   Title Pt will decrease worst neck pain as reported on NPRS by at least 2 points in order to demonstrate clinically significant reduction in back pain.    Baseline 08/14/2020: Patient rates Left sided Neck/UT pain at 4/10 with movement.    Time 12    Period Weeks    Status New  Target Date 11/06/20      PT LONG TERM GOAL #4   Title Patient will demonstrate improved Cervical ROM by 50% in order to improve her functional ability to turn her head or nod for return of functional capabilities including driving, working, and sleeping.    Baseline 08/14/2020= AROM cervical flex= 20deg, ext=25, LF= 10 deg Left/20 deg Right, Rotation= 42 deg L/50 deg R    Time 12    Period Weeks    Status New    Target Date 11/06/20                 Plan - 08/19/20 1131    Clinical Impression Statement Patient performed well with ROM/manual techniques today without report of increased pain. She did experience some end range pain. Reviewed some cervical ROM/stretching today and patient with good working knowledge of exercises to improve her mobility. She did demo improved cervical ROM from pre treatment to post treatment. She will benefit from continued skilled PT services to progress her ROM, Pain relief and assist in returning patient to her previous level of function in home and at work.    Personal Factors and Comorbidities Comorbidity 1;Comorbidity 2;Past/Current Experience;Profession    Comorbidities HTN, Previous neck surgery    Examination-Activity Limitations Bathing;Bed Mobility;Caring for Others;Carry;Dressing;Lift;Reach Overhead;Sleep    Examination-Participation Restrictions Cleaning;Community Activity;Driving;Laundry;Occupation;Yard Work    Stability/Clinical Decision Making Stable/Uncomplicated    Rehab Potential Good    PT Frequency 2x / week    PT Duration 12 weeks    PT Treatment/Interventions ADLs/Self Care  Home Management;Cryotherapy;Electrical Stimulation;Moist Heat;Functional mobility training;Therapeutic activities;Therapeutic exercise;Patient/family education;Manual techniques;Passive range of motion;Dry needling;Taping;Spinal Manipulations;Joint Manipulations    PT Next Visit Plan Continue with manual techniques and therapeutic exercises for improved ROM/UE/postural strengthening    PT Home Exercise Plan Reviewed previously instructed Cervical ROM    Consulted and Agree with Plan of Care Patient           Patient will benefit from skilled therapeutic intervention in order to improve the following deficits and impairments:  Decreased activity tolerance,Decreased endurance,Decreased mobility,Decreased range of motion,Decreased strength,Hypomobility,Impaired UE functional use  Visit Diagnosis: Cervicalgia  Muscle weakness (generalized)     Problem List Patient Active Problem List   Diagnosis Date Noted  . PFO (patent foramen ovale)   . CAD (coronary artery disease), native coronary artery   . Acute CVA (cerebrovascular accident) ([place]) 06/15/2018  . Depression 10/16/2017  . Migraine 10/16/2017  . Hyperglycemia, unspecified 07/10/2017  . Hyperglycemia 07/10/2017  . H/O dizziness 08/02/2016  . Cervical disc disorder with radiculopathy of cervical region 04/12/2016  . Nonallopathic lesion of thoracic region 04/12/2016  . Nonallopathic lesion of rib cage 04/12/2016  . Facet arthritis of cervical region 03/21/2016  . Hyperlipidemia 01/26/2015  . Labral tear of shoulder, right, subsequent encounter 12/04/2014  . 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 Impingement syndrome of right shoulder 05/28/2014  . Right supraspinatus tenosynovitis 05/28/2014  . Arthritis pain 04/03/2014  . Fatigue 04/03/2014  . 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 IBS (irritable bowel syndrome) 06/20/2012  . Lumbar disc disease 06/20/2012    Lewis Moccasin, PT 08/19/2020, 4:28 PM  [place] MAIN [facility] SERVICES [address] [place], [place], [address] Phone: [phone]   Fax:  [phone]  Name: AVO SCHLACHTER MRN: [id number] Date of Birth: December 06, 1960

## 2020-08-20 ENCOUNTER — Other Ambulatory Visit: Payer: Self-pay | Admitting: Internal Medicine

## 2020-08-21 ENCOUNTER — Other Ambulatory Visit: Payer: Self-pay

## 2020-08-21 ENCOUNTER — Ambulatory Visit: Payer: 59

## 2020-08-21 DIAGNOSIS — M6281 Muscle weakness (generalized): Secondary | ICD-10-CM | POA: Diagnosis not present

## 2020-08-21 DIAGNOSIS — M542 Cervicalgia: Secondary | ICD-10-CM

## 2020-08-21 NOTE — Therapy (Signed)
[place] MAIN [facility] SERVICES [address] [place], [place], [address] Phone: [phone]   Fax:  [phone]  Physical Therapy Treatment  Patient Details  Name: Lori Morales MRN: [id number] Date of Birth: April 17, 1961 Referring Provider (PT): Lily Kocher, [place]   Encounter Date: 08/21/2020   PT End of Session - 08/21/20 0926    Visit Number 3    Number of Visits 25    Date for PT Re-Evaluation 11/06/20    Authorization Type Initial cert: 07/11/9415- 09/05/1446    PT Start Time 0800    PT Stop Time 1856    PT Time Calculation (min) 47 min    Activity Tolerance No increased pain;Patient tolerated treatment well    Behavior During Therapy [facility] for tasks assessed/performed           Past Medical History:  Diagnosis Date  . Arthritis   . CAD (coronary artery disease), native coronary artery    mild non calcified plaque in mid LAD and prox LCx on coronary CTA 01/2019  . Depression   . GERD (gastroesophageal reflux disease)   . Headache    migraines  . Hx of squamous cell carcinoma 07/11/2016   Right distal dorsum lat forearm   . Hypertension   . IBS (irritable bowel syndrome)   . Palpitations   . PFO (patent foramen ovale)    small by cardiac CTA 01/2019  . PONV (postoperative nausea and vomiting)   . Restless legs syndrome   . Squamous cell carcinoma of skin 12/202/2017   right distal dorsum lateral forearm    Past Surgical History:  Procedure Laterality Date  . ABDOMINOPLASTY    . BACK SURGERY     1 laminectomy, 1 fusion  . COLONOSCOPY    . COLONOSCOPY WITH PROPOFOL N/A 01/17/2017   Procedure: COLONOSCOPY WITH PROPOFOL;  Surgeon: Lollie Sails, MD;  Location: [facility] ENDOSCOPY;  Service: Endoscopy;  Laterality: N/A;  . WISDOM TOOTH EXTRACTION      There were no vitals filed for this visit.   Subjective Assessment - 08/21/20 0803    Subjective Patient reports increased soreness and relates to increased physical work  last couple of days.    Pertinent History Patient has PMH: significant for C6-7 arthroplasty. PMH significant for arthritis, depression,IBS, Squamous cell carcinoma, Hypertension.    Limitations Lifting;Writing;House hold activities;Other (comment)   Patient reports 10lb lifting restriction.   How long can you sit comfortably? Increased pain if performing deskwork for > 1/2 day.    How long can you stand comfortably? No issues    How long can you walk comfortably? No issues    Diagnostic tests None    Patient Stated Goals To be painfree and work without pain or restrictions as well as sleep better.    Currently in Pain? Yes    Pain Score 3     Pain Orientation Posterior;Left    Pain Descriptors / Indicators Aching    Pain Type Chronic pain;Acute pain    Pain Onset 1 to 4 weeks ago    Pain Frequency Constant    Aggravating Factors  working, sleeping    Pain Relieving Factors rest, Stretching.    Multiple Pain Sites No             Interventions  Manual cervical distraction hold 30 sec x 4. Patient denied any pain.  Suboccipital release- hold 30 sec x 3 PROM to cervical lateral flex/Rotation x 10 min total. Patient continues to  present with firm end feel throughout left/right rotation and sidebending- hold 30 sec x 3 sets each.  Soft tissue mobilization- B levator/Upper trap region/rhomboid region-in supine In prone- Grade II-III PA thoracic mobs as patient presents with hypomobility. 3x 30 sec Grade IV PA mob to T4-8 region with patient presenting with hypomobility throughout.  Seated Upper trap stretching/STM  With cervical SB x  5 min.   Reviewed cervical ROM/stretching: UT stretch with 30 sec, AROM for cervical rotation. Patient able to demonstrate good technique and no questions regarding these activities today. Instructed her to perform 3-4 times daily with 4 sets of 30 sec hold.    Post treatment measurements: AROM: R/L  39 deg Cervical Flexion  40 deg Cervical  Extension *22/*30 Cervical Lateral Flexion *52/*50 Cervical Rotation *Indicates endrange pain   Discussed adding wall posture stretch with chin tuck to existing home exercise program and instructed to hold stretch for 1 min 3-4 sets daily. Patient verbalized understanding.     Clinical Impression: Patient presented with increased tightness in left Upper trap region so focused more on STM/PROM. She continues to exhibit firm  end range pain yet did present with some improvement in cervical ROM from last visit. Patient responded she "felt looser at the end of session and pain down from a 3/10 to a 2/10.  She will benefit from continued skilled PT services to progress her ROM, Pain relief and assist in returning patient to her previous level of function in home and at work.                                         PT Education - 08/21/20 0925    Education Details Postural stretching    Person(s) Educated Patient    Methods Explanation;Demonstration;Verbal cues    Comprehension Verbalized understanding;Returned demonstration;Verbal cues required            PT Short Term Goals - 08/14/20 1154      PT SHORT TERM GOAL #1   Title Pt will be independent with HEP in order to improve strength and decrease back pain in order to improve pain-free function at home and work.    Baseline 08/13/2020- will need new HEP with new Dx- Has previous HEP for recent Cervical issues- will modify as appropriate.    Time 6    Period Weeks    Status New    Target Date 09/25/20             PT Long Term Goals - 08/14/20 1536      PT LONG TERM GOAL #1   Title Patient will increase FOTO score to equal to or greater than  66/100   to demonstrate statistically significant improvement in mobility and quality of life.    Baseline 08/14/2020: FOTO score=55    Time 12    Period Weeks    Status New    Target Date 11/06/20      PT LONG TERM GOAL #2   Title Pt will demonstrate  decrease in NDI by at least 19% in order to demonstrate clinically significant reduction in disability related to neck injury/pain    Baseline 08/14/2020: NDI= 54%    Time 12    Period Weeks    Status New    Target Date 11/06/20      PT LONG TERM GOAL #3   Title Pt will decrease worst  neck pain as reported on NPRS by at least 2 points in order to demonstrate clinically significant reduction in back pain.    Baseline 08/14/2020: Patient rates Left sided Neck/UT pain at 4/10 with movement.    Time 12    Period Weeks    Status New    Target Date 11/06/20      PT LONG TERM GOAL #4   Title Patient will demonstrate improved Cervical ROM by 50% in order to improve her functional ability to turn her head or nod for return of functional capabilities including driving, working, and sleeping.    Baseline 08/14/2020= AROM cervical flex= 20deg, ext=25, LF= 10 deg Left/20 deg Right, Rotation= 42 deg L/50 deg R    Time 12    Period Weeks    Status New    Target Date 11/06/20                 Plan - 08/21/20 4709    Clinical Impression Statement Patient presented with increased tightness in left Upper trap region so focused more on STM/PROM. She continues to exhibit firm  end range pain yet did present with some improvement in cervical ROM from last visit. Patient responded she "felt looser at the end of session and pain down from a 3/10 to a 2/10.  She will benefit from continued skilled PT services to progress her ROM, Pain relief and assist in returning patient to her previous level of function in home and at work.    Personal Factors and Comorbidities Comorbidity 1;Comorbidity 2;Past/Current Experience;Profession    Comorbidities HTN, Previous neck surgery    Examination-Activity Limitations Bathing;Bed Mobility;Caring for Others;Carry;Dressing;Lift;Reach Overhead;Sleep    Examination-Participation Restrictions Cleaning;Community Activity;Driving;Laundry;Occupation;Yard Work     Stability/Clinical Decision Making Stable/Uncomplicated    Rehab Potential Good    PT Frequency 2x / week    PT Duration 12 weeks    PT Treatment/Interventions ADLs/Self Care Home Management;Cryotherapy;Electrical Stimulation;Moist Heat;Functional mobility training;Therapeutic activities;Therapeutic exercise;Patient/family education;Manual techniques;Passive range of motion;Dry needling;Taping;Spinal Manipulations;Joint Manipulations    PT Next Visit Plan Continue with manual techniques and therapeutic exercises for improved ROM/UE/postural strengthening    PT Home Exercise Plan Reviewed previously instructed Cervical ROM.    Consulted and Agree with Plan of Care Patient           Patient will benefit from skilled therapeutic intervention in order to improve the following deficits and impairments:  Decreased activity tolerance,Decreased endurance,Decreased mobility,Decreased range of motion,Decreased strength,Hypomobility,Impaired UE functional use  Visit Diagnosis: Cervicalgia  Muscle weakness (generalized)     Problem List Patient Active Problem List   Diagnosis Date Noted  . PFO (patent foramen ovale)   . CAD (coronary artery disease), native coronary artery   . Acute CVA (cerebrovascular accident) ([place]) 06/15/2018  . Depression 10/16/2017  . Migraine 10/16/2017  . Hyperglycemia, unspecified 07/10/2017  . Hyperglycemia 07/10/2017  . H/O dizziness 08/02/2016  . Cervical disc disorder with radiculopathy of cervical region 04/12/2016  . Nonallopathic lesion of thoracic region 04/12/2016  . Nonallopathic lesion of rib cage 04/12/2016  . Facet arthritis of cervical region 03/21/2016  . Hyperlipidemia 01/26/2015  . Labral tear of shoulder, right, subsequent encounter 12/04/2014  . 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 Impingement syndrome of right shoulder 05/28/2014  . Right supraspinatus tenosynovitis 05/28/2014  . Arthritis pain 04/03/2014  . Fatigue 04/03/2014  . 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 IBS (irritable bowel syndrome) 06/20/2012  . Lumbar disc disease 06/20/2012    [place] Moccasin, PT  08/21/2020, 9:28 AM  [place] MAIN [facility] SERVICES [address] [place], [place], [address] Phone: [phone]   Fax:  [phone]  Name: Lori Morales MRN: [id number] Date of Birth: [birth year]

## 2020-08-25 ENCOUNTER — Other Ambulatory Visit: Payer: Self-pay | Admitting: Internal Medicine

## 2020-08-25 DIAGNOSIS — Z0000 Encounter for general adult medical examination without abnormal findings: Secondary | ICD-10-CM | POA: Diagnosis not present

## 2020-08-25 DIAGNOSIS — I2510 Atherosclerotic heart disease of native coronary artery without angina pectoris: Secondary | ICD-10-CM | POA: Diagnosis not present

## 2020-08-25 DIAGNOSIS — I10 Essential (primary) hypertension: Secondary | ICD-10-CM | POA: Diagnosis not present

## 2020-08-25 DIAGNOSIS — E7849 Other hyperlipidemia: Secondary | ICD-10-CM | POA: Diagnosis not present

## 2020-08-26 ENCOUNTER — Other Ambulatory Visit: Payer: Self-pay

## 2020-08-26 ENCOUNTER — Ambulatory Visit: Payer: PRIVATE HEALTH INSURANCE

## 2020-08-26 DIAGNOSIS — M542 Cervicalgia: Secondary | ICD-10-CM

## 2020-08-26 DIAGNOSIS — M6281 Muscle weakness (generalized): Secondary | ICD-10-CM

## 2020-08-26 DIAGNOSIS — M5412 Radiculopathy, cervical region: Secondary | ICD-10-CM

## 2020-08-26 NOTE — Therapy (Signed)
[place] MAIN [facility] SERVICES [address] [place], [place], [address] Phone: [phone]   Fax:  [phone]  Physical Therapy Treatment  Patient Details  Name: Lori Morales MRN: [id number] Date of Birth: Jun 15, 1960 Referring Provider (PT): Lily Kocher, [place]   Encounter Date: 08/26/2020   PT End of Session - 08/26/20 0941    Visit Number 4    Number of Visits 25    Date for PT Re-Evaluation 11/06/20    Authorization Type Initial cert: 5/46/2703- 5/00/9381    Authorization Time Period 4/8 approved from workers comp    PT Start Time 0945    PT Stop Time 1029    PT Time Calculation (min) 44 min    Activity Tolerance No increased pain;Patient tolerated treatment well    Behavior During Therapy [facility] for tasks assessed/performed           Past Medical History:  Diagnosis Date  . Arthritis   . CAD (coronary artery disease), native coronary artery    mild non calcified plaque in mid LAD and prox LCx on coronary CTA 01/2019  . Depression   . GERD (gastroesophageal reflux disease)   . Headache    migraines  . Hx of squamous cell carcinoma 07/11/2016   Right distal dorsum lat forearm   . Hypertension   . IBS (irritable bowel syndrome)   . Palpitations   . PFO (patent foramen ovale)    small by cardiac CTA 01/2019  . PONV (postoperative nausea and vomiting)   . Restless legs syndrome   . Squamous cell carcinoma of skin 12/202/2017   right distal dorsum lateral forearm    Past Surgical History:  Procedure Laterality Date  . ABDOMINOPLASTY    . BACK SURGERY     1 laminectomy, 1 fusion  . COLONOSCOPY    . COLONOSCOPY WITH PROPOFOL N/A 01/17/2017   Procedure: COLONOSCOPY WITH PROPOFOL;  Surgeon: Lollie Sails, MD;  Location: [facility] ENDOSCOPY;  Service: Endoscopy;  Laterality: N/A;  . WISDOM TOOTH EXTRACTION      There were no vitals filed for this visit.   Subjective Assessment - 08/26/20 1035    Subjective Patient reports  continued soreness and limitations throughout the day. Has been compliant with stretching and heating.    Pertinent History Patient has PMH: significant for C6-7 arthroplasty. PMH significant for arthritis, depression,IBS, Squamous cell carcinoma, Hypertension.    Limitations Lifting;Writing;House hold activities;Other (comment)   Patient reports 10lb lifting restriction.   How long can you sit comfortably? Increased pain if performing deskwork for > 1/2 day.    How long can you stand comfortably? No issues    How long can you walk comfortably? No issues    Diagnostic tests None    Patient Stated Goals To be painfree and work without pain or restrictions as well as sleep better.    Currently in Pain? Yes    Pain Score 3     Pain Location Neck    Pain Orientation Left;Posterior    Pain Descriptors / Indicators Aching    Pain Type Chronic pain;Acute pain    Pain Onset 1 to 4 weeks ago    Pain Frequency Constant                supine: use of heat pad under upper thoracic lower cervical region Cervical sidebend with gentle overpressure 4x45 second holds each direction  cervical rotation with gentle overpressure 4x 45 second holds each direction Suboccipital release  3x30 second holds     Prone: J mobilization to upper thoracic/lower cervical for reduced thoracic kyphosis. 3x30 seconds  STM to upper trap, levator scap, cervical paraspinals x8 minutes with implementation of effleurage and ptrissage for muscle tissue tension reduction.      Seated: Cervical side bend with towel 2x 45 second holds Upper trap with lateral side bend away from affected side 3x 30 second holds  RTB ER 12x cues for scapular depression and retraction  L median nerve glide head movement only 10x        Pt educated throughout session about proper posture and technique with exercises. Improved exercise technique, movement at target joints, use of target muscles after min to mod verbal, visual, tactile  cues.       Trigger Point Dry Needling (TDN), unbilled Education performed with patient regarding potential benefit of TDN. Reviewed precautions and risks with patient. Reviewed special precautions/risks over lung fields which include pneumothorax. Reviewed signs and symptoms of pneumothorax and advised pt to go to ER immediately if these symptoms develop advise them of dry needling treatment. Extensive time spent with pt to ensure full understanding of TDN risks. Pt provided verbal consent to treatment. TDN performed to  with 0.25 x .40 single needle placements with local twitch response (LTR) to L upper trap. Pistoning technique utilized. Improved pain-free motion following intervention. x4 minutes    Patient tolerated progressive manual and therex well with report of decreased pain by end of session. Focused left cervical paraspinal, levator scap, and upper trap involvement noted with manual addressing deficits. TDN performed and can be seen in note with patient verbalizing understanding and precautions. She will benefit from continued skilled PT services to progress her ROM, Pain relief and assist in returning patient to her previous level of function in home and at work.                PT Education - 08/26/20 0941    Education Details exercise technique, manual, dry needling    Person(s) Educated Patient    Methods Explanation;Demonstration;Tactile cues;Verbal cues    Comprehension Verbalized understanding;Returned demonstration;Verbal cues required;Tactile cues required            PT Short Term Goals - 08/14/20 1154      PT SHORT TERM GOAL #1   Title Pt will be independent with HEP in order to improve strength and decrease back pain in order to improve pain-free function at home and work.    Baseline 08/13/2020- will need new HEP with new Dx- Has previous HEP for recent Cervical issues- will modify as appropriate.    Time 6    Period Weeks    Status New    Target Date  09/25/20             PT Long Term Goals - 08/14/20 1536      PT LONG TERM GOAL #1   Title Patient will increase FOTO score to equal to or greater than  66/100   to demonstrate statistically significant improvement in mobility and quality of life.    Baseline 08/14/2020: FOTO score=55    Time 12    Period Weeks    Status New    Target Date 11/06/20      PT LONG TERM GOAL #2   Title Pt will demonstrate decrease in NDI by at least 19% in order to demonstrate clinically significant reduction in disability related to neck injury/pain    Baseline 08/14/2020: NDI= 54%  Time 12    Period Weeks    Status New    Target Date 11/06/20      PT LONG TERM GOAL #3   Title Pt will decrease worst neck pain as reported on NPRS by at least 2 points in order to demonstrate clinically significant reduction in back pain.    Baseline 08/14/2020: Patient rates Left sided Neck/UT pain at 4/10 with movement.    Time 12    Period Weeks    Status New    Target Date 11/06/20      PT LONG TERM GOAL #4   Title Patient will demonstrate improved Cervical ROM by 50% in order to improve her functional ability to turn her head or nod for return of functional capabilities including driving, working, and sleeping.    Baseline 08/14/2020= AROM cervical flex= 20deg, ext=25, LF= 10 deg Left/20 deg Right, Rotation= 42 deg L/50 deg R    Time 12    Period Weeks    Status New    Target Date 11/06/20                 Plan - 08/26/20 1036    Clinical Impression Statement Patient tolerated progressive manual and therex well with report of decreased pain by end of session. Focused left cervical paraspinal, levator scap, and upper trap involvement noted with manual addressing deficits. TDN performed and can be seen in note with patient verbalizing understanding and precautions. She will benefit from continued skilled PT services to progress her ROM, Pain relief and assist in returning patient to her previous level of  function in home and at work.    Personal Factors and Comorbidities Comorbidity 1;Comorbidity 2;Past/Current Experience;Profession    Comorbidities HTN, Previous neck surgery    Examination-Activity Limitations Bathing;Bed Mobility;Caring for Others;Carry;Dressing;Lift;Reach Overhead;Sleep    Examination-Participation Restrictions Cleaning;Community Activity;Driving;Laundry;Occupation;Yard Work    Stability/Clinical Decision Making Stable/Uncomplicated    Rehab Potential Good    PT Frequency 2x / week    PT Duration 12 weeks    PT Treatment/Interventions ADLs/Self Care Home Management;Cryotherapy;Electrical Stimulation;Moist Heat;Functional mobility training;Therapeutic activities;Therapeutic exercise;Patient/family education;Manual techniques;Passive range of motion;Dry needling;Taping;Spinal Manipulations;Joint Manipulations    PT Next Visit Plan Continue with manual techniques and therapeutic exercises for improved ROM/UE/postural strengthening    PT Home Exercise Plan Reviewed previously instructed Cervical ROM.    Consulted and Agree with Plan of Care Patient           Patient will benefit from skilled therapeutic intervention in order to improve the following deficits and impairments:  Decreased activity tolerance,Decreased endurance,Decreased mobility,Decreased range of motion,Decreased strength,Hypomobility,Impaired UE functional use  Visit Diagnosis: Cervicalgia  Muscle weakness (generalized)  Radiculopathy, cervical region     Problem List Patient Active Problem List   Diagnosis Date Noted  . PFO (patent foramen ovale)   . CAD (coronary artery disease), native coronary artery   . Acute CVA (cerebrovascular accident) ([place]) 06/15/2018  . Depression 10/16/2017  . Migraine 10/16/2017  . Hyperglycemia, unspecified 07/10/2017  . Hyperglycemia 07/10/2017  . H/O dizziness 08/02/2016  . Cervical disc disorder with radiculopathy of cervical region 04/12/2016  . Nonallopathic  lesion of thoracic region 04/12/2016  . Nonallopathic lesion of rib cage 04/12/2016  . Facet arthritis of cervical region 03/21/2016  . Hyperlipidemia 01/26/2015  . Labral tear of shoulder, right, subsequent encounter 12/04/2014  . 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 Impingement syndrome of right shoulder 05/28/2014  . Right supraspinatus tenosynovitis 05/28/2014  . Arthritis pain 04/03/2014  . Fatigue 04/03/2014  .  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 IBS (irritable bowel syndrome) 06/20/2012  . Lumbar disc disease 06/20/2012   Janna Arch, PT, DPT   08/26/2020, 10:38 AM  [place] MAIN [facility] SERVICES [address] [place], [place], [address] Phone: [phone]   Fax:  [phone]  Name: VIVEKA WILMETH MRN: [id number] Date of Birth: August 16, 1960

## 2020-08-27 ENCOUNTER — Ambulatory Visit: Payer: 59

## 2020-08-28 ENCOUNTER — Other Ambulatory Visit: Payer: Self-pay

## 2020-08-28 ENCOUNTER — Ambulatory Visit: Payer: PRIVATE HEALTH INSURANCE | Attending: Physician Assistant

## 2020-08-28 DIAGNOSIS — M6281 Muscle weakness (generalized): Secondary | ICD-10-CM | POA: Insufficient documentation

## 2020-08-28 DIAGNOSIS — M542 Cervicalgia: Secondary | ICD-10-CM | POA: Insufficient documentation

## 2020-08-28 DIAGNOSIS — M5412 Radiculopathy, cervical region: Secondary | ICD-10-CM | POA: Insufficient documentation

## 2020-08-28 DIAGNOSIS — R293 Abnormal posture: Secondary | ICD-10-CM | POA: Diagnosis present

## 2020-08-28 NOTE — Therapy (Signed)
[place] MAIN [facility] SERVICES [address] [place], [place], [address] Phone: [phone]   Fax:  [phone]  Physical Therapy Treatment  Patient Details  Name: Lori Morales MRN: [id number] Date of Birth: 1961-01-03 Referring Provider (PT): Lily Kocher, [place]   Encounter Date: 08/28/2020   PT End of Session - 08/28/20 0935    Visit Number 5    Number of Visits 25    Date for PT Re-Evaluation 11/06/20    Authorization Type Initial cert: 10/29/930- 3/55/7322    Authorization Time Period 5/8 approved from workers comp    PT Start Time 0805    PT Stop Time 0850    PT Time Calculation (min) 45 min    Activity Tolerance No increased pain;Patient tolerated treatment well    Behavior During Therapy [facility] for tasks assessed/performed           Past Medical History:  Diagnosis Date  . Arthritis   . CAD (coronary artery disease), native coronary artery    mild non calcified plaque in mid LAD and prox LCx on coronary CTA 01/2019  . Depression   . GERD (gastroesophageal reflux disease)   . Headache    migraines  . Hx of squamous cell carcinoma 07/11/2016   Right distal dorsum lat forearm   . Hypertension   . IBS (irritable bowel syndrome)   . Palpitations   . PFO (patent foramen ovale)    small by cardiac CTA 01/2019  . PONV (postoperative nausea and vomiting)   . Restless legs syndrome   . Squamous cell carcinoma of skin 12/202/2017   right distal dorsum lateral forearm    Past Surgical History:  Procedure Laterality Date  . ABDOMINOPLASTY    . BACK SURGERY     1 laminectomy, 1 fusion  . COLONOSCOPY    . COLONOSCOPY WITH PROPOFOL N/A 01/17/2017   Procedure: COLONOSCOPY WITH PROPOFOL;  Surgeon: Lollie Sails, MD;  Location: [facility] ENDOSCOPY;  Service: Endoscopy;  Laterality: N/A;  . WISDOM TOOTH EXTRACTION      There were no vitals filed for this visit.   Subjective Assessment - 08/28/20 0812    Subjective Patient reports  better after last session. Reports pain mostly at end of day and rates at a 2/10 today.    Pertinent History Patient has PMH: significant for C6-7 arthroplasty. PMH significant for arthritis, depression,IBS, Squamous cell carcinoma, Hypertension.    Limitations Lifting;Writing;House hold activities;Other (comment)   Patient reports 10lb lifting restriction.   How long can you sit comfortably? Increased pain if performing deskwork for > 1/2 day.    How long can you stand comfortably? No issues    How long can you walk comfortably? No issues    Diagnostic tests None    Patient Stated Goals To be painfree and work without pain or restrictions as well as sleep better.    Currently in Pain? Yes    Pain Score 2     Pain Location Neck    Pain Orientation Posterior;Left    Pain Descriptors / Indicators Aching    Pain Type Chronic pain    Pain Onset 1 to 4 weeks ago    Pain Frequency Constant    Aggravating Factors  working             Pre/Post treatment -Cervical AROM measurements:  Flex-32/50 Ext-40/40 Left Lateral Flex-18/20 Right Lateral Flex-24/27 Left Rotation-30/50 Right Rotation- 22/48 deg *Pain pre treatment (left sided lower cervical/upper thoracic region)="2/10".  Post treatment= "1/10"   Manual therapy Supine: use of moist heat pad under upper thoracic lower cervical region Cervical distration x 30 sec x 3 sets (gentle and patient reported no pain) Cervical sidebend with gentle overpressure 3 x30  second holds each direction  Cervical rotation with gentle overpressure 3x 30 second holds each direction Suboccipital release 2x30 second holds     Prone: Grade 2-3 PA throracic SP mobs/glides x 30 bouts along T4-8 region.  STM to upper trap, levator scap, cervical paraspinals, Rhomboid region x 10 minutes ptrissage for muscle tissue tension reduction.       Seated: Cervical side bend with overpressure 2 sets of 30 second holds Cervical Rotation with overpressure 2  sets of 30 second holds      Pt educated throughout session about proper posture and technique with exercises. Improved exercise technique, movement at target joints, use of target muscles after min to mod verbal, visual, tactile cues.    Verbally reviewed cervical ROM strategies and importance of daily ROM to improve her cervical mobility and she verbalized good understanding of all cervical stretches and ROM from Previous episodes of care.   Clinical Impression: Patient continues to present and report  improving cervical ROM/pain relief following manual therapy intervention today. Patient presents with less trigger/tight nodules along Upper trap region than last week and stated she felt much better after this weeks session including dry needing technique earlier in the week and manual technique. She will benefit from continued skilled PT services to progress her ROM, Pain relief and assist in returning patient to her previous level of function in home and at work.                         PT Education - 08/28/20 0934    Education Details Review of cervical ROM and explanation of manual techniques for improved pain relief and ROM    Person(s) Educated Patient    Methods Explanation;Verbal cues    Comprehension Verbalized understanding;Verbal cues required            PT Short Term Goals - 08/14/20 1154      PT SHORT TERM GOAL #1   Title Pt will be independent with HEP in order to improve strength and decrease back pain in order to improve pain-free function at home and work.    Baseline 08/13/2020- will need new HEP with new Dx- Has previous HEP for recent Cervical issues- will modify as appropriate.    Time 6    Period Weeks    Status New    Target Date 09/25/20             PT Long Term Goals - 08/14/20 1536      PT LONG TERM GOAL #1   Title Patient will increase FOTO score to equal to or greater than  66/100   to demonstrate statistically significant  improvement in mobility and quality of life.    Baseline 08/14/2020: FOTO score=55    Time 12    Period Weeks    Status New    Target Date 11/06/20      PT LONG TERM GOAL #2   Title Pt will demonstrate decrease in NDI by at least 19% in order to demonstrate clinically significant reduction in disability related to neck injury/pain    Baseline 08/14/2020: NDI= 54%    Time 12    Period Weeks    Status New    Target Date  11/06/20      PT LONG TERM GOAL #3   Title Pt will decrease worst neck pain as reported on NPRS by at least 2 points in order to demonstrate clinically significant reduction in back pain.    Baseline 08/14/2020: Patient rates Left sided Neck/UT pain at 4/10 with movement.    Time 12    Period Weeks    Status New    Target Date 11/06/20      PT LONG TERM GOAL #4   Title Patient will demonstrate improved Cervical ROM by 50% in order to improve her functional ability to turn her head or nod for return of functional capabilities including driving, working, and sleeping.    Baseline 08/14/2020= AROM cervical flex= 20deg, ext=25, LF= 10 deg Left/20 deg Right, Rotation= 42 deg L/50 deg R    Time 12    Period Weeks    Status New    Target Date 11/06/20                 Plan - 08/28/20 0932    Clinical Impression Statement Patient continues to present and report  improving cervical ROM/pain relief following manual therapy intervention today. Patient presents with less trigger/tight nodules along Upper trap region than last week and stated she felt much better after this weeks session including dry needing technique earlier in the week and manual technique. She will benefit from continued skilled PT services to progress her ROM, Pain relief and assist in returning patient to her previous level of function in home and at work.    Personal Factors and Comorbidities Comorbidity 1;Comorbidity 2;Past/Current Experience;Profession    Comorbidities HTN, Previous neck surgery     Examination-Activity Limitations Bathing;Bed Mobility;Caring for Others;Carry;Dressing;Lift;Reach Overhead;Sleep    Examination-Participation Restrictions Cleaning;Community Activity;Driving;Laundry;Occupation;Yard Work    Stability/Clinical Decision Making Stable/Uncomplicated    Rehab Potential Good    PT Frequency 2x / week    PT Duration 12 weeks    PT Treatment/Interventions ADLs/Self Care Home Management;Cryotherapy;Electrical Stimulation;Moist Heat;Functional mobility training;Therapeutic activities;Therapeutic exercise;Patient/family education;Manual techniques;Passive range of motion;Dry needling;Taping;Spinal Manipulations;Joint Manipulations    PT Next Visit Plan Continue with manual techniques and therapeutic exercises for improved ROM/UE/postural strengthening    PT Home Exercise Plan Reviewed previously instructed Cervical ROM.    Consulted and Agree with Plan of Care Patient           Patient will benefit from skilled therapeutic intervention in order to improve the following deficits and impairments:  Decreased activity tolerance,Decreased endurance,Decreased mobility,Decreased range of motion,Decreased strength,Hypomobility,Impaired UE functional use  Visit Diagnosis: Cervicalgia  Muscle weakness (generalized)     Problem List Patient Active Problem List   Diagnosis Date Noted  . PFO (patent foramen ovale)   . CAD (coronary artery disease), native coronary artery   . Acute CVA (cerebrovascular accident) ([place]) 06/15/2018  . Depression 10/16/2017  . Migraine 10/16/2017  . Hyperglycemia, unspecified 07/10/2017  . Hyperglycemia 07/10/2017  . H/O dizziness 08/02/2016  . Cervical disc disorder with radiculopathy of cervical region 04/12/2016  . Nonallopathic lesion of thoracic region 04/12/2016  . Nonallopathic lesion of rib cage 04/12/2016  . Facet arthritis of cervical region 03/21/2016  . Hyperlipidemia 01/26/2015  . Labral tear of shoulder, right, subsequent  encounter 12/04/2014  . 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 Impingement syndrome of right shoulder 05/28/2014  . Right supraspinatus tenosynovitis 05/28/2014  . Arthritis pain 04/03/2014  . Fatigue 04/03/2014  . 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 IBS (irritable bowel syndrome) 06/20/2012  .  Lumbar disc disease 06/20/2012    Lewis Moccasin, PT 08/28/2020, 9:38 AM  [place] MAIN [facility] SERVICES [address] [place], [place], [address] Phone: [phone]   Fax:  [phone]  Name: Lori Morales MRN: [id number] Date of Birth: 1961/01/07

## 2020-08-29 ENCOUNTER — Other Ambulatory Visit: Payer: Self-pay

## 2020-08-29 MED FILL — Prednisone Tab 10 MG: ORAL | 8 days supply | Qty: 20 | Fill #0 | Status: CN

## 2020-08-31 ENCOUNTER — Other Ambulatory Visit: Payer: Self-pay

## 2020-08-31 ENCOUNTER — Ambulatory Visit: Payer: PRIVATE HEALTH INSURANCE

## 2020-08-31 DIAGNOSIS — M542 Cervicalgia: Secondary | ICD-10-CM

## 2020-08-31 DIAGNOSIS — M6281 Muscle weakness (generalized): Secondary | ICD-10-CM

## 2020-08-31 DIAGNOSIS — R293 Abnormal posture: Secondary | ICD-10-CM

## 2020-08-31 DIAGNOSIS — M5412 Radiculopathy, cervical region: Secondary | ICD-10-CM

## 2020-08-31 NOTE — Therapy (Signed)
[place] MAIN [facility] SERVICES [address] [place], [place], [address] Phone: [phone]   Fax:  [phone]  Physical Therapy Treatment  Patient Details  Name: Lori Morales MRN: [id number] Date of Birth: 06/05/1960 Referring Provider (PT): Lily Kocher, [place]   Encounter Date: 08/31/2020   PT End of Session - 08/31/20 1018    Visit Number 6    Number of Visits 25    Date for PT Re-Evaluation 11/06/20    Authorization Type Initial cert: 09/06/7351- 2/99/2426    Authorization Time Period 6/8 approved from workers comp    PT Start Time 0945    PT Stop Time 1029    PT Time Calculation (min) 44 min    Activity Tolerance No increased pain;Patient tolerated treatment well    Behavior During Therapy [facility] for tasks assessed/performed           Past Medical History:  Diagnosis Date  . Arthritis   . CAD (coronary artery disease), native coronary artery    mild non calcified plaque in mid LAD and prox LCx on coronary CTA 01/2019  . Depression   . GERD (gastroesophageal reflux disease)   . Headache    migraines  . Hx of squamous cell carcinoma 07/11/2016   Right distal dorsum lat forearm   . Hypertension   . IBS (irritable bowel syndrome)   . Palpitations   . PFO (patent foramen ovale)    small by cardiac CTA 01/2019  . PONV (postoperative nausea and vomiting)   . Restless legs syndrome   . Squamous cell carcinoma of skin 12/202/2017   right distal dorsum lateral forearm    Past Surgical History:  Procedure Laterality Date  . ABDOMINOPLASTY    . BACK SURGERY     1 laminectomy, 1 fusion  . COLONOSCOPY    . COLONOSCOPY WITH PROPOFOL N/A 01/17/2017   Procedure: COLONOSCOPY WITH PROPOFOL;  Surgeon: Lollie Sails, MD;  Location: [facility] ENDOSCOPY;  Service: Endoscopy;  Laterality: N/A;  . WISDOM TOOTH EXTRACTION      There were no vitals filed for this visit.   Subjective Assessment - 08/31/20 1016    Subjective Patient reports  compliance with HEP. Has been having reduced pain in shoulder and neck.    Pertinent History Patient has PMH: significant for C6-7 arthroplasty. PMH significant for arthritis, depression,IBS, Squamous cell carcinoma, Hypertension.    Limitations Lifting;Writing;House hold activities;Other (comment)   Patient reports 10lb lifting restriction.   How long can you sit comfortably? Increased pain if performing deskwork for > 1/2 day.    How long can you stand comfortably? No issues    How long can you walk comfortably? No issues    Diagnostic tests None    Patient Stated Goals To be painfree and work without pain or restrictions as well as sleep better.    Currently in Pain? Yes    Pain Score 2     Pain Location Neck    Pain Orientation Left    Pain Descriptors / Indicators Aching    Pain Type Chronic pain    Pain Onset 1 to 4 weeks ago                   Ammi supine: use of heat pad under upper thoracic lower cervical region Cervical sidebend with gentle overpressure 4x45 second holds each direction  cervical rotation with gentle overpressure 4x 45 second holds each direction Suboccipital release 3x30 second holds  RTB ER 15x  Scapular retraction and cervical chin tuck 10x 5 second holds    Prone: J mobilization to upper thoracic/lower cervical for reduced thoracic kyphosis. 3x30 seconds  STM to upper trap, levator scap, cervical paraspinals, and infraspinatus x8 minutes with implementation of effleurage and ptrissage for muscle tissue tension reduction.       Seated:  Upper trap with lateral side bend away from affected side 3x 30 second holds         Pt educated throughout session about proper posture and technique with exercises. Improved exercise technique, movement at target joints, use of target muscles after min to mod verbal, visual, tactile cues.     Trigger Point Dry Needling (TDN), unbilled Education performed with patient regarding potential benefit of TDN.  Reviewed precautions and risks with patient. Reviewed special precautions/risks over lung fields which include pneumothorax. Reviewed signs and symptoms of pneumothorax and advised pt to go to ER immediately if these symptoms develop advise them of dry needling treatment. Extensive time spent with pt to ensure full understanding of TDN risks. Pt provided verbal consent to treatment. TDN performed to  with 0.25 x .40 single needle placements with local twitch response (LTR) to L upper trap, L levator scap, and L infraspinatus . Pistoning technique utilized. Improved pain-free motion following intervention. x5 minutes                   PT Education - 08/31/20 1018    Education Details exercise technique, needling education, manual    Person(s) Educated Patient    Methods Explanation;Demonstration;Tactile cues;Verbal cues    Comprehension Verbalized understanding;Returned demonstration;Verbal cues required;Tactile cues required            PT Short Term Goals - 08/14/20 1154      PT SHORT TERM GOAL #1   Title Pt will be independent with HEP in order to improve strength and decrease back pain in order to improve pain-free function at home and work.    Baseline 08/13/2020- will need new HEP with new Dx- Has previous HEP for recent Cervical issues- will modify as appropriate.    Time 6    Period Weeks    Status New    Target Date 09/25/20             PT Long Term Goals - 08/14/20 1536      PT LONG TERM GOAL #1   Title Patient will increase FOTO score to equal to or greater than  66/100   to demonstrate statistically significant improvement in mobility and quality of life.    Baseline 08/14/2020: FOTO score=55    Time 12    Period Weeks    Status New    Target Date 11/06/20      PT LONG TERM GOAL #2   Title Pt will demonstrate decrease in NDI by at least 19% in order to demonstrate clinically significant reduction in disability related to neck injury/pain    Baseline  08/14/2020: NDI= 54%    Time 12    Period Weeks    Status New    Target Date 11/06/20      PT LONG TERM GOAL #3   Title Pt will decrease worst neck pain as reported on NPRS by at least 2 points in order to demonstrate clinically significant reduction in back pain.    Baseline 08/14/2020: Patient rates Left sided Neck/UT pain at 4/10 with movement.    Time 12    Period Weeks  Status New    Target Date 11/06/20      PT LONG TERM GOAL #4   Title Patient will demonstrate improved Cervical ROM by 50% in order to improve her functional ability to turn her head or nod for return of functional capabilities including driving, working, and sleeping.    Baseline 08/14/2020= AROM cervical flex= 20deg, ext=25, LF= 10 deg Left/20 deg Right, Rotation= 42 deg L/50 deg R    Time 12    Period Weeks    Status New    Target Date 11/06/20                 Plan - 08/31/20 1236    Clinical Impression Statement Patient's pain was localized to infraspinatus this session. Targeted manual and therex intervention. Pain localization was reduced by end of session however patient reported increased fatigue in region by end of session. Postural interventions tolerated well. She will benefit from continued skilled PT services to progress her ROM, Pain relief and assist in returning patient to her previous level of function in home and at work.    Personal Factors and Comorbidities Comorbidity 1;Comorbidity 2;Past/Current Experience;Profession    Comorbidities HTN, Previous neck surgery    Examination-Activity Limitations Bathing;Bed Mobility;Caring for Others;Carry;Dressing;Lift;Reach Overhead;Sleep    Examination-Participation Restrictions Cleaning;Community Activity;Driving;Laundry;Occupation;Yard Work    Stability/Clinical Decision Making Stable/Uncomplicated    Rehab Potential Good    PT Frequency 2x / week    PT Duration 12 weeks    PT Treatment/Interventions ADLs/Self Care Home  Management;Cryotherapy;Electrical Stimulation;Moist Heat;Functional mobility training;Therapeutic activities;Therapeutic exercise;Patient/family education;Manual techniques;Passive range of motion;Dry needling;Taping;Spinal Manipulations;Joint Manipulations    PT Next Visit Plan Continue with manual techniques and therapeutic exercises for improved ROM/UE/postural strengthening    PT Home Exercise Plan Reviewed previously instructed Cervical ROM.    Consulted and Agree with Plan of Care Patient           Patient will benefit from skilled therapeutic intervention in order to improve the following deficits and impairments:  Decreased activity tolerance,Decreased endurance,Decreased mobility,Decreased range of motion,Decreased strength,Hypomobility,Impaired UE functional use  Visit Diagnosis: Cervicalgia  Muscle weakness (generalized)  Radiculopathy, cervical region  Abnormal posture     Problem List Patient Active Problem List   Diagnosis Date Noted  . PFO (patent foramen ovale)   . CAD (coronary artery disease), native coronary artery   . Acute CVA (cerebrovascular accident) ([place]) 06/15/2018  . Depression 10/16/2017  . Migraine 10/16/2017  . Hyperglycemia, unspecified 07/10/2017  . Hyperglycemia 07/10/2017  . H/O dizziness 08/02/2016  . Cervical disc disorder with radiculopathy of cervical region 04/12/2016  . Nonallopathic lesion of thoracic region 04/12/2016  . Nonallopathic lesion of rib cage 04/12/2016  . Facet arthritis of cervical region 03/21/2016  . Hyperlipidemia 01/26/2015  . Labral tear of shoulder, right, subsequent encounter 12/04/2014  . 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 Impingement syndrome of right shoulder 05/28/2014  . Right supraspinatus tenosynovitis 05/28/2014  . Arthritis pain 04/03/2014  . Fatigue 04/03/2014  . 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 IBS (irritable bowel syndrome) 06/20/2012  . Lumbar disc disease 06/20/2012   Janna Arch, PT, DPT   08/31/2020, 12:37 PM  [place] MAIN [facility] SERVICES [address] [place], [place], [address] Phone: [phone]   Fax:  [phone]  Name: Lori Morales MRN: [id number] Date of Birth: [birth year]

## 2020-09-02 ENCOUNTER — Ambulatory Visit: Payer: 59 | Admitting: Dermatology

## 2020-09-02 ENCOUNTER — Other Ambulatory Visit: Payer: Self-pay

## 2020-09-02 ENCOUNTER — Ambulatory Visit: Payer: PRIVATE HEALTH INSURANCE

## 2020-09-02 DIAGNOSIS — M5412 Radiculopathy, cervical region: Secondary | ICD-10-CM

## 2020-09-02 DIAGNOSIS — D2361 Other benign neoplasm of skin of right upper limb, including shoulder: Secondary | ICD-10-CM

## 2020-09-02 DIAGNOSIS — M6281 Muscle weakness (generalized): Secondary | ICD-10-CM

## 2020-09-02 DIAGNOSIS — M542 Cervicalgia: Secondary | ICD-10-CM

## 2020-09-02 NOTE — Progress Notes (Signed)
   Follow-Up Visit   Subjective  Lori Morales is a 60 y.o. female who presents for the following: Skin Problem (Patient here today for a spot at right 1st finger. It was sore and did use salicylic acid on it. Area is not sore now but she is concerned it could be a wart. ).  The following portions of the chart were reviewed this encounter and updated as appropriate:   Tobacco  Allergies  Meds  Problems  Med Hx  Surg Hx  Fam Hx      Review of Systems:  No other skin or systemic complaints except as noted in HPI or Assessment and Plan.  Objective  Well appearing patient in no apparent distress; mood and affect are within normal limits.  A focused examination was performed including right 1st finger. Relevant physical exam findings are noted in the Assessment and Plan.  Objective  Right 2nd finger DIP: Rounded pink papule with central crust overlying joint   Assessment & Plan  Benign neoplasm of skin of right upper extremity Right 2nd finger DIP  Symptomatic  DDX Traumatized Digital Mucous Cyst > traumatized viral wart.  Will treat with LN2, if not responding consider referring to ortho for evaluation and possible treatment.  Prior to procedure, discussed risks of blister formation, small wound, skin dyspigmentation, or rare scar following cryotherapy.    Destruction of lesion - Right 2nd finger DIP  Destruction method: cryotherapy   Informed consent: discussed and consent obtained   Lesion destroyed using liquid nitrogen: Yes   Cryotherapy cycles:  2 Outcome: patient tolerated procedure well with no complications   Post-procedure details: wound care instructions given    Return in about 4 weeks (around 09/30/2020).  Graciella Belton, RMA, am acting as scribe for Forest Gleason, MD .  Documentation: I have reviewed the above documentation for accuracy and completeness, and I agree with the above.  Forest Gleason, MD

## 2020-09-02 NOTE — Therapy (Signed)
[place] MAIN [facility] SERVICES [address] [place], [place], [address] Phone: [phone]   Fax:  [phone]  Physical Therapy Treatment  Patient Details  Name: Lori Morales MRN: [id number] Date of Birth: March 06, 1961 Referring Provider (PT): Lily Kocher, [place]   Encounter Date: 09/02/2020   PT End of Session - 09/02/20 0758    Visit Number 7    Number of Visits 25    Date for PT Re-Evaluation 11/06/20    Authorization Type Initial cert: 5/36/1443- 1/54/0086    Authorization Time Period 7/8 approved from workers comp    PT Start Time 0800    PT Stop Time 0844    PT Time Calculation (min) 44 min    Activity Tolerance No increased pain;Patient tolerated treatment well    Behavior During Therapy [facility] for tasks assessed/performed           Past Medical History:  Diagnosis Date  . Arthritis   . CAD (coronary artery disease), native coronary artery    mild non calcified plaque in mid LAD and prox LCx on coronary CTA 01/2019  . Depression   . GERD (gastroesophageal reflux disease)   . Headache    migraines  . Hx of squamous cell carcinoma 07/11/2016   Right distal dorsum lat forearm   . Hypertension   . IBS (irritable bowel syndrome)   . Palpitations   . PFO (patent foramen ovale)    small by cardiac CTA 01/2019  . PONV (postoperative nausea and vomiting)   . Restless legs syndrome   . Squamous cell carcinoma of skin 12/202/2017   right distal dorsum lateral forearm    Past Surgical History:  Procedure Laterality Date  . ABDOMINOPLASTY    . BACK SURGERY     1 laminectomy, 1 fusion  . COLONOSCOPY    . COLONOSCOPY WITH PROPOFOL N/A 01/17/2017   Procedure: COLONOSCOPY WITH PROPOFOL;  Surgeon: Lollie Sails, MD;  Location: [facility] ENDOSCOPY;  Service: Endoscopy;  Laterality: N/A;  . WISDOM TOOTH EXTRACTION      There were no vitals filed for this visit.   Subjective Assessment - 09/02/20 0834    Subjective Patient reports  soreness in her shoulder, is aching in her neck but much improved from previous sessions.    Pertinent History Patient has PMH: significant for C6-7 arthroplasty. PMH significant for arthritis, depression,IBS, Squamous cell carcinoma, Hypertension.    Limitations Lifting;Writing;House hold activities;Other (comment)   Patient reports 10lb lifting restriction.   How long can you sit comfortably? Increased pain if performing deskwork for > 1/2 day.    How long can you stand comfortably? No issues    How long can you walk comfortably? No issues    Diagnostic tests None    Patient Stated Goals To be painfree and work without pain or restrictions as well as sleep better.    Currently in Pain? Yes    Pain Score 1     Pain Location Scapula    Pain Orientation Left    Pain Descriptors / Indicators Aching    Pain Type Chronic pain    Pain Onset 1 to 4 weeks ago    Pain Frequency Constant                supine: use of heat pad under upper thoracic lower cervical region Cervical sidebend with gentle overpressure 4x45 second holds each direction  cervical rotation with gentle overpressure 4x 45 second holds each direction Suboccipital release  3x30 second holds   RTB ER 15x  RTB overhead stabilization reach /arc 10x  Scapular retraction and cervical chin tuck 10x 5 second holds  cervical extension with towel under neck 10x  Prone:   STM to upper trap, levator scap, cervical paraspinals, and infraspinatus x8 minutes with implementation of effleurage and ptrissage for muscle tissue tension reduction.    Seated: Upper trap with lateral side bend away from affected side 3x 30 second holds  GTB row 15x    Pt educated throughout session about proper posture and technique with exercises. Improved exercise technique, movement at target joints, use of target muscles after min to mod verbal, visual, tactile cues.     Trigger Point Dry Needling (TDN), unbilled Education performed with patient  regarding potential benefit of TDN. Reviewed precautions and risks with patient. Reviewed special precautions/risks over lung fields which include pneumothorax. Reviewed signs and symptoms of pneumothorax and advised pt to go to ER immediately if these symptoms develop advise them of dry needling treatment. Extensive time spent with pt to ensure full understanding of TDN risks. Pt provided verbal consent to treatment. TDN performed to  with 0.25 x .40 single needle placements with local twitch response (LTR) to L upper trap, L levator scap, rhomboids  and L infraspinatus . Pistoning technique utilized. Improved pain-free motion following intervention. x5 minutes     Patient tolerated session with focus on L scapular strengthening and pain reduction. New trigger points throughout infraspinatus and rhomboids reduced with dry needling, manual, and therex. She will benefit from continued skilled PT services to progress her ROM, Pain relief and assist in returning patient to her previous level of function in home and at work. TDN performed and can be seen in note with patient verbalizing understanding and precautions. She will benefit from continued skilled PT services to progress her ROM, pain relief and assist in returning patient to her previous level of function in home and at work.                  PT Education - 09/02/20 0758    Education Details exercise technique, manual    Person(s) Educated Patient    Methods Explanation;Demonstration;Tactile cues;Verbal cues    Comprehension Verbalized understanding;Returned demonstration;Verbal cues required;Tactile cues required            PT Short Term Goals - 08/14/20 1154      PT SHORT TERM GOAL #1   Title Pt will be independent with HEP in order to improve strength and decrease back pain in order to improve pain-free function at home and work.    Baseline 08/13/2020- will need new HEP with new Dx- Has previous HEP for recent Cervical  issues- will modify as appropriate.    Time 6    Period Weeks    Status New    Target Date 09/25/20             PT Long Term Goals - 08/14/20 1536      PT LONG TERM GOAL #1   Title Patient will increase FOTO score to equal to or greater than  66/100   to demonstrate statistically significant improvement in mobility and quality of life.    Baseline 08/14/2020: FOTO score=55    Time 12    Period Weeks    Status New    Target Date 11/06/20      PT LONG TERM GOAL #2   Title Pt will demonstrate decrease in NDI by at least 19%  in order to demonstrate clinically significant reduction in disability related to neck injury/pain    Baseline 08/14/2020: NDI= 54%    Time 12    Period Weeks    Status New    Target Date 11/06/20      PT LONG TERM GOAL #3   Title Pt will decrease worst neck pain as reported on NPRS by at least 2 points in order to demonstrate clinically significant reduction in back pain.    Baseline 08/14/2020: Patient rates Left sided Neck/UT pain at 4/10 with movement.    Time 12    Period Weeks    Status New    Target Date 11/06/20      PT LONG TERM GOAL #4   Title Patient will demonstrate improved Cervical ROM by 50% in order to improve her functional ability to turn her head or nod for return of functional capabilities including driving, working, and sleeping.    Baseline 08/14/2020= AROM cervical flex= 20deg, ext=25, LF= 10 deg Left/20 deg Right, Rotation= 42 deg L/50 deg R    Time 12    Period Weeks    Status New    Target Date 11/06/20                 Plan - 09/02/20 1248    Clinical Impression Statement Patient tolerated session with focus on L scapular strengthening and pain reduction. New trigger points throughout infraspinatus and rhomboids reduced with dry needling, manual, and therex. She will benefit from continued skilled PT services to progress her ROM, Pain relief and assist in returning patient to her previous level of function in home and at  work. TDN performed and can be seen in note with patient verbalizing understanding and precautions. She will benefit from continued skilled PT services to progress her ROM, pain relief and assist in returning patient to her previous level of function in home and at work.    Personal Factors and Comorbidities Comorbidity 1;Comorbidity 2;Past/Current Experience;Profession    Comorbidities HTN, Previous neck surgery    Examination-Activity Limitations Bathing;Bed Mobility;Caring for Others;Carry;Dressing;Lift;Reach Overhead;Sleep    Examination-Participation Restrictions Cleaning;Community Activity;Driving;Laundry;Occupation;Yard Work    Stability/Clinical Decision Making Stable/Uncomplicated    Rehab Potential Good    PT Frequency 2x / week    PT Duration 12 weeks    PT Treatment/Interventions ADLs/Self Care Home Management;Cryotherapy;Electrical Stimulation;Moist Heat;Functional mobility training;Therapeutic activities;Therapeutic exercise;Patient/family education;Manual techniques;Passive range of motion;Dry needling;Taping;Spinal Manipulations;Joint Manipulations    PT Next Visit Plan Continue with manual techniques and therapeutic exercises for improved ROM/UE/postural strengthening    PT Home Exercise Plan Reviewed previously instructed Cervical ROM.    Consulted and Agree with Plan of Care Patient           Patient will benefit from skilled therapeutic intervention in order to improve the following deficits and impairments:  Decreased activity tolerance,Decreased endurance,Decreased mobility,Decreased range of motion,Decreased strength,Hypomobility,Impaired UE functional use  Visit Diagnosis: Cervicalgia  Muscle weakness (generalized)  Radiculopathy, cervical region     Problem List Patient Active Problem List   Diagnosis Date Noted  . PFO (patent foramen ovale)   . CAD (coronary artery disease), native coronary artery   . Acute CVA (cerebrovascular accident) ([place]) 06/15/2018   . Depression 10/16/2017  . Migraine 10/16/2017  . Hyperglycemia, unspecified 07/10/2017  . Hyperglycemia 07/10/2017  . H/O dizziness 08/02/2016  . Cervical disc disorder with radiculopathy of cervical region 04/12/2016  . Nonallopathic lesion of thoracic region 04/12/2016  . Nonallopathic lesion of rib cage 04/12/2016  .  Facet arthritis of cervical region 03/21/2016  . Hyperlipidemia 01/26/2015  . Labral tear of shoulder, right, subsequent encounter 12/04/2014  . 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 Impingement syndrome of right shoulder 05/28/2014  . Right supraspinatus tenosynovitis 05/28/2014  . Arthritis pain 04/03/2014  . Fatigue 04/03/2014  . 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 IBS (irritable bowel syndrome) 06/20/2012  . Lumbar disc disease 06/20/2012   Janna Arch, PT, DPT   09/02/2020, 12:49 PM  [place] MAIN [facility] SERVICES [address] [place], [place], [address] Phone: [phone]   Fax:  [phone]  Name: Lori Morales MRN: [id number] Date of Birth: 14-Jan-1961

## 2020-09-02 NOTE — Patient Instructions (Addendum)
Cryotherapy Aftercare  . Wash gently with soap and water everyday.   Marland Kitchen Apply Vaseline and Band-Aid daily until healed.  Prior to procedure, discussed risks of blister formation, small wound, skin dyspigmentation, or rare scar following cryotherapy.    If you have any questions or concerns for your doctor, please call our main line at [phone] and press option 4 to reach your doctor's medical assistant. If no one answers, please leave a voicemail as directed and we will return your call as soon as possible. Messages left after 4 pm will be answered the following business day.   You may also send [place] a message via [place]. We typically respond to MyChart messages within 1-2 business days.  For prescription refills, please ask your pharmacy to contact our office. Our fax number is [phone].  If you have an urgent issue when the clinic is closed that cannot wait until the next business day, you can page your doctor at the number below.    Please note that while we do our best to be available for urgent issues outside of office hours, we are not available 24/7.   If you have an urgent issue and are unable to reach [place], you may choose to seek medical care at your doctor's office, retail clinic, urgent care center, or emergency room.  If you have a medical emergency, please immediately call 911 or go to the emergency department.  Pager Numbers  - Dr. Nehemiah Massed: [phone]  - Dr. Laurence Ferrari: [phone]  - Dr. Nicole Kindred: [phone]  In the event of inclement weather, please call our main line at [phone] for an update on the status of any delays or closures.  Dermatology Medication Tips: Please keep the boxes that topical medications come in in order to help keep track of the instructions about where and how to use these. Pharmacies typically print the medication instructions only on the boxes and not directly on the medication tubes.   If your medication is too expensive, please  contact our office at [phone] option 4 or send [place] a message through [place].   We are unable to tell what your co-pay for medications will be in advance as this is different depending on your insurance coverage. However, we may be able to find a substitute medication at lower cost or fill out paperwork to get insurance to cover a needed medication.   If a prior authorization is required to get your medication covered by your insurance company, please allow [place] 1-2 business days to complete this process.  Drug prices often vary depending on where the prescription is filled and some pharmacies may offer cheaper prices.  The website www.goodrx.com contains coupons for medications through different pharmacies. The prices here do not account for what the cost may be with help from insurance (it may be cheaper with your insurance), but the website can give you the price if you did not use any insurance.  - You can print the associated coupon and take it with your prescription to the pharmacy.  - You may also stop by our office during regular business hours and pick up a GoodRx coupon card.  - If you need your prescription sent electronically to a different pharmacy, notify our office through [facility] [place] [facility] or by phone at [phone] option 4.

## 2020-09-07 ENCOUNTER — Ambulatory Visit: Payer: PRIVATE HEALTH INSURANCE

## 2020-09-07 ENCOUNTER — Other Ambulatory Visit: Payer: Self-pay

## 2020-09-07 DIAGNOSIS — M6281 Muscle weakness (generalized): Secondary | ICD-10-CM

## 2020-09-07 DIAGNOSIS — M542 Cervicalgia: Secondary | ICD-10-CM

## 2020-09-07 DIAGNOSIS — M5412 Radiculopathy, cervical region: Secondary | ICD-10-CM

## 2020-09-07 NOTE — Therapy (Signed)
[place] MAIN [facility] SERVICES [address] [place], [place], [address] Phone: [phone]   Fax:  [phone]  Physical Therapy Treatment  Patient Details  Name: Lori Morales MRN: [id number] Date of Birth: September 23, 1960 Referring Provider (PT): Lily Kocher, [place]   Encounter Date: 09/07/2020   PT End of Session - 09/07/20 0810    Visit Number 8    Number of Visits 25    Date for PT Re-Evaluation 11/06/20    Authorization Type Initial cert: 7/98/9211- 9/41/7408    Authorization Time Period 7/8 approved from workers comp    PT Start Time 0800    PT Stop Time 0844    PT Time Calculation (min) 44 min    Activity Tolerance No increased pain;Patient tolerated treatment well    Behavior During Therapy [facility] for tasks assessed/performed           Past Medical History:  Diagnosis Date  . Arthritis   . CAD (coronary artery disease), native coronary artery    mild non calcified plaque in mid LAD and prox LCx on coronary CTA 01/2019  . Depression   . GERD (gastroesophageal reflux disease)   . Headache    migraines  . Hx of squamous cell carcinoma 07/11/2016   Right distal dorsum lat forearm   . Hypertension   . IBS (irritable bowel syndrome)   . Palpitations   . PFO (patent foramen ovale)    small by cardiac CTA 01/2019  . PONV (postoperative nausea and vomiting)   . Restless legs syndrome   . Squamous cell carcinoma of skin 12/202/2017   right distal dorsum lateral forearm    Past Surgical History:  Procedure Laterality Date  . ABDOMINOPLASTY    . BACK SURGERY     1 laminectomy, 1 fusion  . COLONOSCOPY    . COLONOSCOPY WITH PROPOFOL N/A 01/17/2017   Procedure: COLONOSCOPY WITH PROPOFOL;  Surgeon: Lollie Sails, MD;  Location: [facility] ENDOSCOPY;  Service: Endoscopy;  Laterality: N/A;  . WISDOM TOOTH EXTRACTION      There were no vitals filed for this visit.   Subjective Assessment - 09/07/20 0808    Subjective Patient reports  she is 60% back to her baseline. Went to physician for health at work and was told she can have more PT visits.    Pertinent History Patient has PMH: significant for C6-7 arthroplasty. PMH significant for arthritis, depression,IBS, Squamous cell carcinoma, Hypertension.    Limitations Lifting;Writing;House hold activities;Other (comment)   Patient reports 10lb lifting restriction.   How long can you sit comfortably? Increased pain if performing deskwork for > 1/2 day.    How long can you stand comfortably? No issues    How long can you walk comfortably? No issues    Diagnostic tests None    Patient Stated Goals To be painfree and work without pain or restrictions as well as sleep better.    Currently in Pain? Yes    Pain Score 1     Pain Location Neck    Pain Orientation Left    Pain Descriptors / Indicators Aching    Pain Type Chronic pain    Pain Onset 1 to 4 weeks ago    Pain Frequency Intermittent                FOTO: 54% NDI: 30%  VAS: 3/10 at worst,  Cervical ROM    Right Left  Flexion 30  Extension 30  Side Bending 28  12  Rotation 10% limitation 10% limitation       supine: use of heat pad under upper thoracic lower cervical region Cervical sidebend with gentle overpressure 4x45 second holds each direction  cervical rotation with gentle overpressure 4x 45 second holds each direction Suboccipital release 3x30 second holds   RTB ER 15x  RTB overhead stabilization reach /arc 10x    Prone:  STM to upper trap, levator scap, cervical paraspinals, and infraspinatus x8 minutes with implementation of effleurage and ptrissage for muscle tissue tension reduction.       Pt educated throughout session about proper posture and technique with exercises. Improved exercise technique, movement at target joints, use of target muscles after min to mod verbal, visual, tactile cues.     Trigger Point Dry Needling (TDN), unbilled Education performed with patient regarding  potential benefit of TDN. Reviewed precautions and risks with patient. Reviewed special precautions/risks over lung fields which include pneumothorax. Reviewed signs and symptoms of pneumothorax and advised pt to go to ER immediately if these symptoms develop advise them of dry needling treatment. Extensive time spent with pt to ensure full understanding of TDN risks. Pt provided verbal consent to treatment. TDN performed to  with 0.30 x .30 single needle placements with local twitch response (LTR) to L upper trap, L levator scap, rhomboids  and L infraspinatus . Pistoning technique utilized. Improved pain-free motion following intervention. x5 minutes                       PT Education - 09/07/20 0809    Education Details goals, manual, exercise technique    Person(s) Educated Patient    Methods Explanation;Demonstration;Tactile cues;Verbal cues    Comprehension Verbalized understanding;Returned demonstration;Verbal cues required;Tactile cues required            PT Short Term Goals - 09/07/20 0842      PT SHORT TERM GOAL #1   Title Pt will be independent with HEP in order to improve strength and decrease back pain in order to improve pain-free function at home and work.    Baseline 08/13/2020- will need new HEP with new Dx- Has previous HEP for recent Cervical issues- will modify as appropriate. 4/11: HEP compliant    Time 6    Period Weeks    Status Partially Met    Target Date 09/25/20             PT Long Term Goals - 09/07/20 0840      PT LONG TERM GOAL #1   Title Patient will increase FOTO score to equal to or greater than  66/100   to demonstrate statistically significant improvement in mobility and quality of life.    Baseline 08/14/2020: FOTO score=55 4/11: 54%    Time 12    Period Weeks    Status Partially Met    Target Date 11/06/20      PT LONG TERM GOAL #2   Title Pt will demonstrate decrease in NDI by at least 19% in order to demonstrate clinically  significant reduction in disability related to neck injury/pain    Baseline 08/14/2020: NDI= 54% 4/11: 30%    Time 12    Period Weeks    Status Partially Met    Target Date 11/06/20      PT LONG TERM GOAL #3   Title Pt will decrease worst neck pain as reported on NPRS by at least 2 points in order to demonstrate clinically significant reduction in  back pain.    Baseline 08/14/2020: Patient rates Left sided Neck/UT pain at 4/10 with movement. 4/11: 3/10 at worst    Time 12    Period Weeks    Status Partially Met    Target Date 11/06/20      PT LONG TERM GOAL #4   Title Patient will demonstrate improved Cervical ROM by 50% in order to improve her functional ability to turn her head or nod for return of functional capabilities including driving, working, and sleeping.    Baseline 08/14/2020= AROM cervical flex= 20deg, ext=25, LF= 10 deg Left/20 deg Right, Rotation= 42 deg L/50 deg R 4/11; see note    Time 12    Period Weeks    Status Partially Met    Target Date 11/06/20                 Plan - 09/07/20 0857    Clinical Impression Statement Patient reports she is 60% back to her baseline. Went to physician for health at work and was told she can have more PT visits. She demonstrates excellent progression towards goals at this time as can be seen in note section. Decreased  Trigger points noted throughout L extremity as well as improved muscle tissue length with decreased pain. TDN performed and can be seen in note with patient verbalizing understanding and precautions. She will benefit from continued skilled PT services to progress her ROM, pain relief and assist in returning patient to her previous level of function in home and at work.    Personal Factors and Comorbidities Comorbidity 1;Comorbidity 2;Past/Current Experience;Profession    Comorbidities HTN, Previous neck surgery    Examination-Activity Limitations Bathing;Bed Mobility;Caring for Others;Carry;Dressing;Lift;Reach  Overhead;Sleep    Examination-Participation Restrictions Cleaning;Community Activity;Driving;Laundry;Occupation;Yard Work    Stability/Clinical Decision Making Stable/Uncomplicated    Rehab Potential Good    PT Frequency 2x / week    PT Duration 12 weeks    PT Treatment/Interventions ADLs/Self Care Home Management;Cryotherapy;Electrical Stimulation;Moist Heat;Functional mobility training;Therapeutic activities;Therapeutic exercise;Patient/family education;Manual techniques;Passive range of motion;Dry needling;Taping;Spinal Manipulations;Joint Manipulations    PT Next Visit Plan Continue with manual techniques and therapeutic exercises for improved ROM/UE/postural strengthening    PT Home Exercise Plan Reviewed previously instructed Cervical ROM.    Consulted and Agree with Plan of Care Patient           Patient will benefit from skilled therapeutic intervention in order to improve the following deficits and impairments:  Decreased activity tolerance,Decreased endurance,Decreased mobility,Decreased range of motion,Decreased strength,Hypomobility,Impaired UE functional use  Visit Diagnosis: Cervicalgia  Muscle weakness (generalized)  Radiculopathy, cervical region     Problem List Patient Active Problem List   Diagnosis Date Noted  . PFO (patent foramen ovale)   . CAD (coronary artery disease), native coronary artery   . Acute CVA (cerebrovascular accident) ([place]) 06/15/2018  . Depression 10/16/2017  . Migraine 10/16/2017  . Hyperglycemia, unspecified 07/10/2017  . Hyperglycemia 07/10/2017  . H/O dizziness 08/02/2016  . Cervical disc disorder with radiculopathy of cervical region 04/12/2016  . Nonallopathic lesion of thoracic region 04/12/2016  . Nonallopathic lesion of rib cage 04/12/2016  . Facet arthritis of cervical region 03/21/2016  . Hyperlipidemia 01/26/2015  . Labral tear of shoulder, right, subsequent encounter 12/04/2014  . 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 Impingement  syndrome of right shoulder 05/28/2014  . Right supraspinatus tenosynovitis 05/28/2014  . Arthritis pain 04/03/2014  . Fatigue 04/03/2014  . 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 IBS (irritable bowel syndrome) 06/20/2012  .  Lumbar disc disease 06/20/2012   Janna Arch, PT, DPT   09/07/2020, 8:58 AM  [place] MAIN [facility] SERVICES [address] [place], [place], [address] Phone: [phone]   Fax:  [phone]  Name: LONEY PETO MRN: [id number] Date of Birth: 1960-10-01

## 2020-09-08 ENCOUNTER — Other Ambulatory Visit: Payer: Self-pay

## 2020-09-09 ENCOUNTER — Ambulatory Visit: Payer: PRIVATE HEALTH INSURANCE

## 2020-09-09 ENCOUNTER — Other Ambulatory Visit: Payer: Self-pay

## 2020-09-09 DIAGNOSIS — M542 Cervicalgia: Secondary | ICD-10-CM | POA: Diagnosis not present

## 2020-09-09 DIAGNOSIS — R293 Abnormal posture: Secondary | ICD-10-CM

## 2020-09-09 DIAGNOSIS — M5412 Radiculopathy, cervical region: Secondary | ICD-10-CM

## 2020-09-09 DIAGNOSIS — M6281 Muscle weakness (generalized): Secondary | ICD-10-CM

## 2020-09-09 NOTE — Therapy (Signed)
[place] MAIN [facility] SERVICES [address] [place], [place], [address] Phone: [phone]   Fax:  [phone]  Physical Therapy Treatment  Patient Details  Name: Lori Morales MRN: [id number] Date of Birth: [birth year] Referring Provider (PT): Lily Kocher, [place]   Encounter Date: 09/09/2020   PT End of Session - 09/09/20 0835    Visit Number 9    Number of Visits 25    Date for PT Re-Evaluation 11/06/20    Authorization Type Initial cert: 06/09/347- 11/07/6433    Authorization Time Period 8/8 approved from workers comp    PT Start Time 0801    PT Stop Time 0845    PT Time Calculation (min) 44 min    Activity Tolerance No increased pain;Patient tolerated treatment well    Behavior During Therapy [facility] for tasks assessed/performed           Past Medical History:  Diagnosis Date  . Arthritis   . CAD (coronary artery disease), native coronary artery    mild non calcified plaque in mid LAD and prox LCx on coronary CTA 01/2019  . Depression   . GERD (gastroesophageal reflux disease)   . Headache    migraines  . Hx of squamous cell carcinoma 07/11/2016   Right distal dorsum lat forearm   . Hypertension   . IBS (irritable bowel syndrome)   . Palpitations   . PFO (patent foramen ovale)    small by cardiac CTA 01/2019  . PONV (postoperative nausea and vomiting)   . Restless legs syndrome   . Squamous cell carcinoma of skin 12/202/2017   right distal dorsum lateral forearm    Past Surgical History:  Procedure Laterality Date  . ABDOMINOPLASTY    . BACK SURGERY     1 laminectomy, 1 fusion  . COLONOSCOPY    . COLONOSCOPY WITH PROPOFOL N/A 01/17/2017   Procedure: COLONOSCOPY WITH PROPOFOL;  Surgeon: Lollie Sails, MD;  Location: [facility] ENDOSCOPY;  Service: Endoscopy;  Laterality: N/A;  . WISDOM TOOTH EXTRACTION      There were no vitals filed for this visit.   Subjective Assessment - 09/09/20 0832    Subjective Patient reports  her pain was the best she has ever felt after last session. No falls or LOB since last session.    Pertinent History Patient has PMH: significant for C6-7 arthroplasty. PMH significant for arthritis, depression,IBS, Squamous cell carcinoma, Hypertension.    Limitations Lifting;Writing;House hold activities;Other (comment)   Patient reports 10lb lifting restriction.   How long can you sit comfortably? Increased pain if performing deskwork for > 1/2 day.    How long can you stand comfortably? No issues    How long can you walk comfortably? No issues    Diagnostic tests None    Patient Stated Goals To be painfree and work without pain or restrictions as well as sleep better.    Currently in Pain? Yes    Pain Score 1     Pain Location Neck    Pain Orientation Right    Pain Descriptors / Indicators Aching    Pain Type Chronic pain    Pain Onset 1 to 4 weeks ago    Pain Frequency Intermittent                     supine: use of heat pad under upper thoracic lower cervical region Cervical sidebend with gentle overpressure 3x45 second holds each direction  cervical rotation with gentle  overpressure 3x 45 second holds each direction Suboccipital release 3x30 second holds   RTB ER 15x  RTB overhead stabilization reach /arc 10x  Scapular retraction and cervical chin tuck 10x 5 second holds  cervical extension with towel under neck 10x   Prone:    STM to upper trap, levator scap, cervical paraspinals, and infraspinatus x8 minutes with implementation of effleurage and ptrissage for muscle tissue tension reduction.     Seated: Upper trap with lateral side bend away from affected side 3x 30 second holds  GTB row 15x   large swiss ball forward rollout 10x , diagonal L 10x, diagonal R 10x    Pt educated throughout session about proper posture and technique with exercises. Improved exercise technique, movement at target joints, use of target muscles after min to mod verbal, visual,  tactile cues.     Trigger Point Dry Needling (TDN), unbilled Education performed with patient regarding potential benefit of TDN. Reviewed precautions and risks with patient. Reviewed special precautions/risks over lung fields which include pneumothorax. Reviewed signs and symptoms of pneumothorax and advised pt to go to ER immediately if these symptoms develop advise them of dry needling treatment. Extensive time spent with pt to ensure full understanding of TDN risks. Pt provided verbal consent to treatment. TDN performed to  with 0.25 x .40 single needle placements with local twitch response (LTR) to L upper trap, L levator scap, rhomboids  and L infraspinatus . Pistoning technique utilized. Improved pain-free motion following intervention. x5 minutes   Patient is progressing with decreased pain reported at start and finish of session. Decreased frequency of trigger points noted with increased posture training tolerance. She will benefit from continued skilled PT services to progress her ROM, pain relief and assist in returning patient to her previous level of function in home and at work.                  PT Education - 09/09/20 0835    Education Details exercise technique, manual    Person(s) Educated Patient    Methods Explanation;Demonstration;Tactile cues;Verbal cues    Comprehension Verbalized understanding;Returned demonstration;Verbal cues required;Tactile cues required            PT Short Term Goals - 09/07/20 0842      PT SHORT TERM GOAL #1   Title Pt will be independent with HEP in order to improve strength and decrease back pain in order to improve pain-free function at home and work.    Baseline 08/13/2020- will need new HEP with new Dx- Has previous HEP for recent Cervical issues- will modify as appropriate. 4/11: HEP compliant    Time 6    Period Weeks    Status Partially Met    Target Date 09/25/20             PT Long Term Goals - 09/07/20 0840       PT LONG TERM GOAL #1   Title Patient will increase FOTO score to equal to or greater than  66/100   to demonstrate statistically significant improvement in mobility and quality of life.    Baseline 08/14/2020: FOTO score=55 4/11: 54%    Time 12    Period Weeks    Status Partially Met    Target Date 11/06/20      PT LONG TERM GOAL #2   Title Pt will demonstrate decrease in NDI by at least 19% in order to demonstrate clinically significant reduction in disability related to neck injury/pain  Baseline 08/14/2020: NDI= 54% 4/11: 30%    Time 12    Period Weeks    Status Partially Met    Target Date 11/06/20      PT LONG TERM GOAL #3   Title Pt will decrease worst neck pain as reported on NPRS by at least 2 points in order to demonstrate clinically significant reduction in back pain.    Baseline 08/14/2020: Patient rates Left sided Neck/UT pain at 4/10 with movement. 4/11: 3/10 at worst    Time 12    Period Weeks    Status Partially Met    Target Date 11/06/20      PT LONG TERM GOAL #4   Title Patient will demonstrate improved Cervical ROM by 50% in order to improve her functional ability to turn her head or nod for return of functional capabilities including driving, working, and sleeping.    Baseline 08/14/2020= AROM cervical flex= 20deg, ext=25, LF= 10 deg Left/20 deg Right, Rotation= 42 deg L/50 deg R 4/11; see note    Time 12    Period Weeks    Status Partially Met    Target Date 11/06/20                 Plan - 09/09/20 0831    Clinical Impression Statement Patient is progressing with decreased pain reported at start and finish of session. Decreased frequency of trigger points noted with increased posture training tolerance. She will benefit from continued skilled PT services to progress her ROM, pain relief and assist in returning patient to her previous level of function in home and at work.    Personal Factors and Comorbidities Comorbidity 1;Comorbidity 2;Past/Current  Experience;Profession    Comorbidities HTN, Previous neck surgery    Examination-Activity Limitations Bathing;Bed Mobility;Caring for Others;Carry;Dressing;Lift;Reach Overhead;Sleep    Examination-Participation Restrictions Cleaning;Community Activity;Driving;Laundry;Occupation;Yard Work    Stability/Clinical Decision Making Stable/Uncomplicated    Rehab Potential Good    PT Frequency 2x / week    PT Duration 12 weeks    PT Treatment/Interventions ADLs/Self Care Home Management;Cryotherapy;Electrical Stimulation;Moist Heat;Functional mobility training;Therapeutic activities;Therapeutic exercise;Patient/family education;Manual techniques;Passive range of motion;Dry needling;Taping;Spinal Manipulations;Joint Manipulations    PT Next Visit Plan Continue with manual techniques and therapeutic exercises for improved ROM/UE/postural strengthening    PT Home Exercise Plan Reviewed previously instructed Cervical ROM.    Consulted and Agree with Plan of Care Patient           Patient will benefit from skilled therapeutic intervention in order to improve the following deficits and impairments:  Decreased activity tolerance,Decreased endurance,Decreased mobility,Decreased range of motion,Decreased strength,Hypomobility,Impaired UE functional use  Visit Diagnosis: Cervicalgia  Muscle weakness (generalized)  Radiculopathy, cervical region  Abnormal posture     Problem List Patient Active Problem List   Diagnosis Date Noted  . PFO (patent foramen ovale)   . CAD (coronary artery disease), native coronary artery   . Acute CVA (cerebrovascular accident) ([place]) 06/15/2018  . Depression 10/16/2017  . Migraine 10/16/2017  . Hyperglycemia, unspecified 07/10/2017  . Hyperglycemia 07/10/2017  . H/O dizziness 08/02/2016  . Cervical disc disorder with radiculopathy of cervical region 04/12/2016  . Nonallopathic lesion of thoracic region 04/12/2016  . Nonallopathic lesion of rib cage 04/12/2016  .  Facet arthritis of cervical region 03/21/2016  . Hyperlipidemia 01/26/2015  . Labral tear of shoulder, right, subsequent encounter 12/04/2014  . 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 Impingement syndrome of right shoulder 05/28/2014  . Right supraspinatus tenosynovitis 05/28/2014  . Arthritis pain 04/03/2014  . Fatigue  04/03/2014  . 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 IBS (irritable bowel syndrome) 06/20/2012  . Lumbar disc disease 06/20/2012   Janna Arch, PT, DPT   09/09/2020, 8:51 AM  [place] MAIN [facility] SERVICES [address] [place], [place], [address] Phone: [phone]   Fax:  [phone]  Name: NIKESHA KWASNY MRN: [id number] Date of Birth: [birth year]

## 2020-09-11 ENCOUNTER — Other Ambulatory Visit: Payer: Self-pay

## 2020-09-11 MED FILL — Prednisone Tab 10 MG: ORAL | 8 days supply | Qty: 20 | Fill #0 | Status: AC

## 2020-09-14 ENCOUNTER — Encounter: Payer: Self-pay | Admitting: Dermatology

## 2020-09-15 ENCOUNTER — Ambulatory Visit: Payer: PRIVATE HEALTH INSURANCE

## 2020-09-15 ENCOUNTER — Other Ambulatory Visit: Payer: Self-pay

## 2020-09-15 DIAGNOSIS — M6281 Muscle weakness (generalized): Secondary | ICD-10-CM

## 2020-09-15 DIAGNOSIS — M542 Cervicalgia: Secondary | ICD-10-CM

## 2020-09-15 DIAGNOSIS — M5412 Radiculopathy, cervical region: Secondary | ICD-10-CM

## 2020-09-15 MED FILL — Zolpidem Tartrate Tab ER 6.25 MG: ORAL | 30 days supply | Qty: 30 | Fill #0 | Status: CN

## 2020-09-15 MED FILL — Pramipexole Dihydrochloride Tab 0.25 MG: ORAL | 30 days supply | Qty: 90 | Fill #0 | Status: CN

## 2020-09-15 MED FILL — Amitriptyline HCl Tab 10 MG: ORAL | 90 days supply | Qty: 90 | Fill #0 | Status: CN

## 2020-09-15 NOTE — Therapy (Signed)
[place] MAIN [facility] SERVICES [address] [place], [place], [address] Phone: [phone]   Fax:  [phone]  Physical Therapy Treatment/Physical Therapy Progress Note   Dates of reporting period  08/14/20   to   09/15/20  Patient Details  Name: Lori Morales MRN: [id number] Date of Birth: [birth year] Referring Provider (PT): Lily Kocher, [place]   Encounter Date: 09/15/2020   PT End of Session - 09/15/20 0843    Visit Number 10    Number of Visits 25    Date for PT Re-Evaluation 11/06/20    Authorization Type Initial cert: 6/38/7564- 3/32/9518    Authorization Time Period 2/8 approved from workers comp    PT Start Time 0845    PT Stop Time 0929    PT Time Calculation (min) 44 min    Activity Tolerance No increased pain;Patient tolerated treatment well    Behavior During Therapy [facility] for tasks assessed/performed           Past Medical History:  Diagnosis Date  . Arthritis   . CAD (coronary artery disease), native coronary artery    mild non calcified plaque in mid LAD and prox LCx on coronary CTA 01/2019  . Depression   . GERD (gastroesophageal reflux disease)   . Headache    migraines  . Hx of squamous cell carcinoma 07/11/2016   Right distal dorsum lat forearm   . Hypertension   . IBS (irritable bowel syndrome)   . Palpitations   . PFO (patent foramen ovale)    small by cardiac CTA 01/2019  . PONV (postoperative nausea and vomiting)   . Restless legs syndrome   . Squamous cell carcinoma of skin 12/202/2017   right distal dorsum lateral forearm    Past Surgical History:  Procedure Laterality Date  . ABDOMINOPLASTY    . BACK SURGERY     1 laminectomy, 1 fusion  . COLONOSCOPY    . COLONOSCOPY WITH PROPOFOL N/A 01/17/2017   Procedure: COLONOSCOPY WITH PROPOFOL;  Surgeon: Lollie Sails, MD;  Location: [facility] ENDOSCOPY;  Service: Endoscopy;  Laterality: N/A;  . WISDOM TOOTH EXTRACTION      There were no vitals filed  for this visit.   Subjective Assessment - 09/15/20 1022    Subjective Patient reports feeling stiff today from stressors at work, recent change of weather. Was able to quilt a little over the weekend with minimal increase in pain.    Pertinent History Patient has PMH: significant for C6-7 arthroplasty. PMH significant for arthritis, depression,IBS, Squamous cell carcinoma, Hypertension.    Limitations Lifting;Writing;House hold activities;Other (comment)   Patient reports 10lb lifting restriction.   How long can you sit comfortably? Increased pain if performing deskwork for > 1/2 day.    How long can you stand comfortably? No issues    How long can you walk comfortably? No issues    Diagnostic tests None    Patient Stated Goals To be painfree and work without pain or restrictions as well as sleep better.    Currently in Pain? Yes    Pain Score 2     Pain Location Neck    Pain Orientation Right    Pain Descriptors / Indicators Aching    Pain Type Chronic pain    Pain Onset 1 to 4 weeks ago    Pain Frequency Intermittent                  Progress note Goals performed 09/07/20 please  refer to this note for further details       supine: use of heat pad under upper thoracic lower cervical region Cervical sidebend with gentle overpressure 4x45 second holds each direction  cervical rotation with gentle overpressure 4x 45 second holds each direction Suboccipital release 3x30 second holds  PVC pipe flexion 10x 5 second hold Median nerve glide 10x Scapular retraction and cervical chin tuck 10x 5 second holds  cervical extension with towel under neck 10x     Seated:    STM to upper trap, levator scap, cervical paraspinals, and infraspinatus x9 minutes with implementation of effleurage and ptrissage for muscle tissue tension reduction. Upper trap with lateral side bend away from affected side 3x 30 second holds  GTB row 15x     Pt educated throughout session about proper  posture and technique with exercises. Improved exercise technique, movement at target joints, use of target muscles after min to mod verbal, visual, tactile cues.     Patient's condition has the potential to improve in response to therapy. Maximum improvement is yet to be obtained. The anticipated improvement is attainable and reasonable in a generally predictable time.  Patient reports she is improving but not yet at 100%     Goals performed 09/07/20 please refer to this note for further details. She has more radiating symptoms this session requiring additional manual intervention for relief.  Patient's condition has the potential to improve in response to therapy. Maximum improvement is yet to be obtained. The anticipated improvement is attainable and reasonable in a generally predictable time.She will benefit from continued skilled PT services to progress her ROM, pain relief and assist in returning patient to her previous level of function in home and at work.                      PT Education - 09/15/20 [phone]    Education Details exercise technique, manual    Person(s) Educated Patient    Methods Explanation;Demonstration;Tactile cues;Verbal cues    Comprehension Verbalized understanding;Verbal cues required;Returned demonstration;Tactile cues required            PT Short Term Goals - 09/07/20 0842      PT SHORT TERM GOAL #1   Title Pt will be independent with HEP in order to improve strength and decrease back pain in order to improve pain-free function at home and work.    Baseline 08/13/2020- will need new HEP with new Dx- Has previous HEP for recent Cervical issues- will modify as appropriate. 4/11: HEP compliant    Time 6    Period Weeks    Status Partially Met    Target Date 09/25/20             PT Long Term Goals - 09/07/20 0840      PT LONG TERM GOAL #1   Title Patient will increase FOTO score to equal to or greater than  66/100   to demonstrate  statistically significant improvement in mobility and quality of life.    Baseline 08/14/2020: FOTO score=55 4/11: 54%    Time 12    Period Weeks    Status Partially Met    Target Date 11/06/20      PT LONG TERM GOAL #2   Title Pt will demonstrate decrease in NDI by at least 19% in order to demonstrate clinically significant reduction in disability related to neck injury/pain    Baseline 08/14/2020: NDI= 54% 4/11: 30%    Time 12  Period Weeks    Status Partially Met    Target Date 11/06/20      PT LONG TERM GOAL #3   Title Pt will decrease worst neck pain as reported on NPRS by at least 2 points in order to demonstrate clinically significant reduction in back pain.    Baseline 08/14/2020: Patient rates Left sided Neck/UT pain at 4/10 with movement. 4/11: 3/10 at worst    Time 12    Period Weeks    Status Partially Met    Target Date 11/06/20      PT LONG TERM GOAL #4   Title Patient will demonstrate improved Cervical ROM by 50% in order to improve her functional ability to turn her head or nod for return of functional capabilities including driving, working, and sleeping.    Baseline 08/14/2020= AROM cervical flex= 20deg, ext=25, LF= 10 deg Left/20 deg Right, Rotation= 42 deg L/50 deg R 4/11; see note    Time 12    Period Weeks    Status Partially Met    Target Date 11/06/20                 Plan - 09/15/20 1255    Clinical Impression Statement Goals performed 09/07/20 please refer to this note for further details. She has more radiating symptoms this session requiring additional manual intervention for relief.  Patient's condition has the potential to improve in response to therapy. Maximum improvement is yet to be obtained. The anticipated improvement is attainable and reasonable in a generally predictable time.She will benefit from continued skilled PT services to progress her ROM, pain relief and assist in returning patient to her previous level of function in home and at  work.    Personal Factors and Comorbidities Comorbidity 1;Comorbidity 2;Past/Current Experience;Profession    Comorbidities HTN, Previous neck surgery    Examination-Activity Limitations Bathing;Bed Mobility;Caring for Others;Carry;Dressing;Lift;Reach Overhead;Sleep    Examination-Participation Restrictions Cleaning;Community Activity;Driving;Laundry;Occupation;Yard Work    Stability/Clinical Decision Making Stable/Uncomplicated    Rehab Potential Good    PT Frequency 2x / week    PT Duration 12 weeks    PT Treatment/Interventions ADLs/Self Care Home Management;Cryotherapy;Electrical Stimulation;Moist Heat;Functional mobility training;Therapeutic activities;Therapeutic exercise;Patient/family education;Manual techniques;Passive range of motion;Dry needling;Taping;Spinal Manipulations;Joint Manipulations    PT Next Visit Plan Continue with manual techniques and therapeutic exercises for improved ROM/UE/postural strengthening    PT Home Exercise Plan Reviewed previously instructed Cervical ROM.    Consulted and Agree with Plan of Care Patient           Patient will benefit from skilled therapeutic intervention in order to improve the following deficits and impairments:  Decreased activity tolerance,Decreased endurance,Decreased mobility,Decreased range of motion,Decreased strength,Hypomobility,Impaired UE functional use  Visit Diagnosis: Cervicalgia  Muscle weakness (generalized)  Radiculopathy, cervical region     Problem List Patient Active Problem List   Diagnosis Date Noted  . PFO (patent foramen ovale)   . CAD (coronary artery disease), native coronary artery   . Acute CVA (cerebrovascular accident) ([place]) 06/15/2018  . Depression 10/16/2017  . Migraine 10/16/2017  . Hyperglycemia, unspecified 07/10/2017  . Hyperglycemia 07/10/2017  . H/O dizziness 08/02/2016  . Cervical disc disorder with radiculopathy of cervical region 04/12/2016  . Nonallopathic lesion of thoracic  region 04/12/2016  . Nonallopathic lesion of rib cage 04/12/2016  . Facet arthritis of cervical region 03/21/2016  . Hyperlipidemia 01/26/2015  . Labral tear of shoulder, right, subsequent encounter 12/04/2014  . 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 Impingement syndrome of right shoulder 05/28/2014  .  Right supraspinatus tenosynovitis 05/28/2014  . Arthritis pain 04/03/2014  . Fatigue 04/03/2014  . 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 IBS (irritable bowel syndrome) 06/20/2012  . Lumbar disc disease 06/20/2012   Janna Arch, PT, DPT    09/15/2020, 12:57 PM  [place] MAIN [facility] SERVICES [address] [place], [place], [address] Phone: [phone]   Fax:  [phone]  Name: JAYE SAAL MRN: [id number] Date of Birth: 03-Jun-1960

## 2020-09-16 ENCOUNTER — Other Ambulatory Visit: Payer: Self-pay

## 2020-09-17 ENCOUNTER — Ambulatory Visit: Payer: PRIVATE HEALTH INSURANCE

## 2020-09-17 ENCOUNTER — Other Ambulatory Visit: Payer: Self-pay

## 2020-09-17 DIAGNOSIS — M542 Cervicalgia: Secondary | ICD-10-CM | POA: Diagnosis not present

## 2020-09-17 DIAGNOSIS — R293 Abnormal posture: Secondary | ICD-10-CM

## 2020-09-17 DIAGNOSIS — M6281 Muscle weakness (generalized): Secondary | ICD-10-CM

## 2020-09-17 DIAGNOSIS — M5412 Radiculopathy, cervical region: Secondary | ICD-10-CM

## 2020-09-17 NOTE — Therapy (Signed)
[place] MAIN [facility] SERVICES [address] [place], [place], [address] Phone: [phone]   Fax:  [phone]  Physical Therapy Treatment  Patient Details  Name: Lori Morales MRN: [id number] Date of Birth: June 27, 1960 Referring Provider (PT): Lily Kocher, [place]   Encounter Date: 09/17/2020   PT End of Session - 09/17/20 0919    Visit Number 11    Number of Visits 25    Date for PT Re-Evaluation 11/06/20    Authorization Type Initial cert: 0/16/5537- 4/82/7078    Authorization Time Period 3/8 approved from workers comp    PT Start Time 0845    PT Stop Time 0927    PT Time Calculation (min) 42 min    Activity Tolerance No increased pain;Patient tolerated treatment well    Behavior During Therapy [facility] for tasks assessed/performed           Past Medical History:  Diagnosis Date  . Arthritis   . CAD (coronary artery disease), native coronary artery    mild non calcified plaque in mid LAD and prox LCx on coronary CTA 01/2019  . Depression   . GERD (gastroesophageal reflux disease)   . Headache    migraines  . Hx of squamous cell carcinoma 07/11/2016   Right distal dorsum lat forearm   . Hypertension   . IBS (irritable bowel syndrome)   . Palpitations   . PFO (patent foramen ovale)    small by cardiac CTA 01/2019  . PONV (postoperative nausea and vomiting)   . Restless legs syndrome   . Squamous cell carcinoma of skin 12/202/2017   right distal dorsum lateral forearm    Past Surgical History:  Procedure Laterality Date  . ABDOMINOPLASTY    . BACK SURGERY     1 laminectomy, 1 fusion  . COLONOSCOPY    . COLONOSCOPY WITH PROPOFOL N/A 01/17/2017   Procedure: COLONOSCOPY WITH PROPOFOL;  Surgeon: Lollie Sails, MD;  Location: [facility] ENDOSCOPY;  Service: Endoscopy;  Laterality: N/A;  . WISDOM TOOTH EXTRACTION      There were no vitals filed for this visit.   Subjective Assessment - 09/17/20 0918    Subjective Patient reports  increased radiating symptoms this session with higher pain last night but better this morning.    Pertinent History Patient has PMH: significant for C6-7 arthroplasty. PMH significant for arthritis, depression,IBS, Squamous cell carcinoma, Hypertension.    Limitations Lifting;Writing;House hold activities;Other (comment)   Patient reports 10lb lifting restriction.   How long can you sit comfortably? Increased pain if performing deskwork for > 1/2 day.    How long can you stand comfortably? No issues    How long can you walk comfortably? No issues    Diagnostic tests None    Patient Stated Goals To be painfree and work without pain or restrictions as well as sleep better.    Currently in Pain? Yes    Pain Score 2     Pain Location Neck    Pain Orientation Right    Pain Descriptors / Indicators Aching    Pain Type Chronic pain    Pain Onset 1 to 4 weeks ago    Pain Frequency Intermittent                     supine: use of heat pad under upper thoracic lower cervical region Cervical sidebend with gentle overpressure 3x60 second holds each direction  cervical rotation with gentle overpressure 3x 60 second holds  each direction Suboccipital release 3x30 second holds   RTB ER 15x  RTB overhead stabilization reach /arc 10x  Scapular retraction and cervical chin tuck 10x 5 second holds  cervical extension with towel under neck 10x   Prone:    STM to upper trap, levator scap, cervical paraspinals, and infraspinatus x8 minutes with implementation of effleurage and ptrissage for muscle tissue tension reduction.     Pt educated throughout session about proper posture and technique with exercises. Improved exercise technique, movement at target joints, use of target muscles after min to mod verbal, visual, tactile cues.     Trigger Point Dry Needling (TDN), unbilled Education performed with patient regarding potential benefit of TDN. Reviewed precautions and risks with patient.  Reviewed special precautions/risks over lung fields which include pneumothorax. Reviewed signs and symptoms of pneumothorax and advised pt to go to ER immediately if these symptoms develop advise them of dry needling treatment. Extensive time spent with pt to ensure full understanding of TDN risks. Pt provided verbal consent to treatment. TDN performed to  with 0.25 x .40 single needle placements with local twitch response (LTR) to L upper trap, L levator scap, rhomboids  and L infraspinatus . Pistoning technique utilized. Improved pain-free motion following intervention. x5 minutes  Patient initially presents with increased radiating symptoms that are improved by end of session with centralization. Increased symptomatic response to infraspinatus noted this session with decreased tenderness after dry needling. Marland Kitchen She will benefit from continued skilled PT services to progress her ROM, pain relief and assist in returning patient to her previous level of function in home and at work.                    PT Education - 09/17/20 0919    Education Details exercise technique, body mechanic    Person(s) Educated Patient    Methods Explanation;Demonstration;Tactile cues;Verbal cues    Comprehension Verbalized understanding;Returned demonstration;Tactile cues required;Verbal cues required            PT Short Term Goals - 09/07/20 0842      PT SHORT TERM GOAL #1   Title Pt will be independent with HEP in order to improve strength and decrease back pain in order to improve pain-free function at home and work.    Baseline 08/13/2020- will need new HEP with new Dx- Has previous HEP for recent Cervical issues- will modify as appropriate. 4/11: HEP compliant    Time 6    Period Weeks    Status Partially Met    Target Date 09/25/20             PT Long Term Goals - 09/07/20 0840      PT LONG TERM GOAL #1   Title Patient will increase FOTO score to equal to or greater than  66/100   to  demonstrate statistically significant improvement in mobility and quality of life.    Baseline 08/14/2020: FOTO score=55 4/11: 54%    Time 12    Period Weeks    Status Partially Met    Target Date 11/06/20      PT LONG TERM GOAL #2   Title Pt will demonstrate decrease in NDI by at least 19% in order to demonstrate clinically significant reduction in disability related to neck injury/pain    Baseline 08/14/2020: NDI= 54% 4/11: 30%    Time 12    Period Weeks    Status Partially Met    Target Date 11/06/20  PT LONG TERM GOAL #3   Title Pt will decrease worst neck pain as reported on NPRS by at least 2 points in order to demonstrate clinically significant reduction in back pain.    Baseline 08/14/2020: Patient rates Left sided Neck/UT pain at 4/10 with movement. 4/11: 3/10 at worst    Time 12    Period Weeks    Status Partially Met    Target Date 11/06/20      PT LONG TERM GOAL #4   Title Patient will demonstrate improved Cervical ROM by 50% in order to improve her functional ability to turn her head or nod for return of functional capabilities including driving, working, and sleeping.    Baseline 08/14/2020= AROM cervical flex= 20deg, ext=25, LF= 10 deg Left/20 deg Right, Rotation= 42 deg L/50 deg R 4/11; see note    Time 12    Period Weeks    Status Partially Met    Target Date 11/06/20                 Plan - 09/17/20 0920    Clinical Impression Statement Patient initially presents with increased radiating symptoms that are improved by end of session with centralization. Increased symptomatic response to infraspinatus noted this session with decreased tenderness after dry needling. Marland Kitchen She will benefit from continued skilled PT services to progress her ROM, pain relief and assist in returning patient to her previous level of function in home and at work.    Personal Factors and Comorbidities Comorbidity 1;Comorbidity 2;Past/Current Experience;Profession    Comorbidities HTN,  Previous neck surgery    Examination-Activity Limitations Bathing;Bed Mobility;Caring for Others;Carry;Dressing;Lift;Reach Overhead;Sleep    Examination-Participation Restrictions Cleaning;Community Activity;Driving;Laundry;Occupation;Yard Work    Stability/Clinical Decision Making Stable/Uncomplicated    Rehab Potential Good    PT Frequency 2x / week    PT Duration 12 weeks    PT Treatment/Interventions ADLs/Self Care Home Management;Cryotherapy;Electrical Stimulation;Moist Heat;Functional mobility training;Therapeutic activities;Therapeutic exercise;Patient/family education;Manual techniques;Passive range of motion;Dry needling;Taping;Spinal Manipulations;Joint Manipulations    PT Next Visit Plan Continue with manual techniques and therapeutic exercises for improved ROM/UE/postural strengthening    PT Home Exercise Plan Reviewed previously instructed Cervical ROM.    Consulted and Agree with Plan of Care Patient           Patient will benefit from skilled therapeutic intervention in order to improve the following deficits and impairments:  Decreased activity tolerance,Decreased endurance,Decreased mobility,Decreased range of motion,Decreased strength,Hypomobility,Impaired UE functional use  Visit Diagnosis: Cervicalgia  Muscle weakness (generalized)  Radiculopathy, cervical region  Abnormal posture     Problem List Patient Active Problem List   Diagnosis Date Noted  . PFO (patent foramen ovale)   . CAD (coronary artery disease), native coronary artery   . Acute CVA (cerebrovascular accident) ([place]) 06/15/2018  . Depression 10/16/2017  . Migraine 10/16/2017  . Hyperglycemia, unspecified 07/10/2017  . Hyperglycemia 07/10/2017  . H/O dizziness 08/02/2016  . Cervical disc disorder with radiculopathy of cervical region 04/12/2016  . Nonallopathic lesion of thoracic region 04/12/2016  . Nonallopathic lesion of rib cage 04/12/2016  . Facet arthritis of cervical region 03/21/2016   . Hyperlipidemia 01/26/2015  . Labral tear of shoulder, right, subsequent encounter 12/04/2014  . 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 Impingement syndrome of right shoulder 05/28/2014  . Right supraspinatus tenosynovitis 05/28/2014  . Arthritis pain 04/03/2014  . Fatigue 04/03/2014  . 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 IBS (irritable bowel syndrome) 06/20/2012  . Lumbar disc disease 06/20/2012   Janna Arch,  PT, DPT    09/17/2020, 9:29 AM  [place] MAIN [facility] SERVICES [address] [place], [place], [address] Phone: [phone]   Fax:  [phone]  Name: Lori Morales MRN: [id number] Date of Birth: [birth year]

## 2020-09-22 ENCOUNTER — Other Ambulatory Visit: Payer: Self-pay

## 2020-09-22 ENCOUNTER — Ambulatory Visit: Payer: PRIVATE HEALTH INSURANCE | Attending: Physician Assistant

## 2020-09-22 DIAGNOSIS — M542 Cervicalgia: Secondary | ICD-10-CM | POA: Insufficient documentation

## 2020-09-22 DIAGNOSIS — M6281 Muscle weakness (generalized): Secondary | ICD-10-CM | POA: Insufficient documentation

## 2020-09-22 DIAGNOSIS — M5412 Radiculopathy, cervical region: Secondary | ICD-10-CM | POA: Diagnosis present

## 2020-09-22 NOTE — Therapy (Signed)
[place] MAIN [facility] SERVICES [address] [place], [place], [address] Phone: [phone]   Fax:  [phone]  Physical Therapy Treatment  Patient Details  Name: Lori Morales MRN: [id number] Date of Birth: May 20, 1961 Referring Provider (PT): Lily Kocher, [place]   Encounter Date: 09/22/2020   PT End of Session - 09/22/20 1250    Visit Number 12    Number of Visits 25    Date for PT Re-Evaluation 11/06/20    Authorization Type Initial cert: 8/46/6599- 3/57/0177    Authorization Time Period 4/8 approved from workers comp    PT Start Time 0845    PT Stop Time 0928    PT Time Calculation (min) 43 min    Activity Tolerance No increased pain;Patient tolerated treatment well    Behavior During Therapy [facility] for tasks assessed/performed           Past Medical History:  Diagnosis Date  . Arthritis   . CAD (coronary artery disease), native coronary artery    mild non calcified plaque in mid LAD and prox LCx on coronary CTA 01/2019  . Depression   . GERD (gastroesophageal reflux disease)   . Headache    migraines  . Hx of squamous cell carcinoma 07/11/2016   Right distal dorsum lat forearm   . Hypertension   . IBS (irritable bowel syndrome)   . Palpitations   . PFO (patent foramen ovale)    small by cardiac CTA 01/2019  . PONV (postoperative nausea and vomiting)   . Restless legs syndrome   . Squamous cell carcinoma of skin 12/202/2017   right distal dorsum lateral forearm    Past Surgical History:  Procedure Laterality Date  . ABDOMINOPLASTY    . BACK SURGERY     1 laminectomy, 1 fusion  . COLONOSCOPY    . COLONOSCOPY WITH PROPOFOL N/A 01/17/2017   Procedure: COLONOSCOPY WITH PROPOFOL;  Surgeon: Lollie Sails, MD;  Location: [facility] ENDOSCOPY;  Service: Endoscopy;  Laterality: N/A;  . WISDOM TOOTH EXTRACTION      There were no vitals filed for this visit.   Subjective Assessment - 09/22/20 1248    Subjective Patient reports  feeling stiff today, no radiating symptoms. Compliant with HEP.    Pertinent History Patient has PMH: significant for C6-7 arthroplasty. PMH significant for arthritis, depression,IBS, Squamous cell carcinoma, Hypertension.    Limitations Lifting;Writing;House hold activities;Other (comment)   Patient reports 10lb lifting restriction.   How long can you sit comfortably? Increased pain if performing deskwork for > 1/2 day.    How long can you stand comfortably? No issues    How long can you walk comfortably? No issues    Diagnostic tests None    Patient Stated Goals To be painfree and work without pain or restrictions as well as sleep better.    Currently in Pain? Yes    Pain Score 2     Pain Location Neck    Pain Orientation Right    Pain Descriptors / Indicators Aching    Pain Type Chronic pain    Pain Onset 1 to 4 weeks ago    Pain Frequency Intermittent                 supine: use of heat pad under upper thoracic lower cervical region Cervical sidebend with gentle overpressure 3x60 second holds each direction  cervical rotation with gentle overpressure 3x 60 second holds each direction Suboccipital release 3x30 second holds  RTB  ER 15x  RTB overhead stabilization reach /arc 10x  Scapular retraction and cervical chin tuck 10x 5 second holds  cervical extension with towel under neck 10x   Prone:    STM to upper trap, levator scap, cervical paraspinals, and infraspinatus x8 minutes with implementation of effleurage and ptrissage for muscle tissue tension reduction.   Standing:  Wall pectoral and back stretch: hands behind head in french stretch, lean into wall with elbows for arch in back 5x15 second holds  Wall slides with BUE 10x  Seated: Half foam roller : thoracic extension 10x    Pt educated throughout session about proper posture and technique with exercises. Improved exercise technique, movement at target joints, use of target muscles after min to mod verbal,  visual, tactile cues.     Trigger Point Dry Needling (TDN), unbilled Education performed with patient regarding potential benefit of TDN. Reviewed precautions and risks with patient. Reviewed special precautions/risks over lung fields which include pneumothorax. Reviewed signs and symptoms of pneumothorax and advised pt to go to ER immediately if these symptoms develop advise them of dry needling treatment. Extensive time spent with pt to ensure full understanding of TDN risks. Pt provided verbal consent to treatment. TDN performed to  with 0.25 x .40 single needle placements with local twitch response (LTR) to L cervical paraspinal, L levator scap, and L infraspinatus . Pistoning technique utilized. Improved pain-free motion following intervention. x5 minutes   Patient tolerated progressive muscle tissue lengthening techniques without pain. She continues to be progressing with decreased pain levels and no radiating of symptoms this session. Patient reports decreased pain by end of session.  She will benefit from continued skilled PT services to progress her ROM, pain relief and assist in returning patient to her previous level of function in home and at work                      PT Education - 09/22/20 1249    Education Details exercise technique, body mechanics    Person(s) Educated Patient    Methods Explanation;Demonstration;Tactile cues;Verbal cues    Comprehension Verbalized understanding;Returned demonstration;Verbal cues required;Tactile cues required            PT Short Term Goals - 09/07/20 0842      PT SHORT TERM GOAL #1   Title Pt will be independent with HEP in order to improve strength and decrease back pain in order to improve pain-free function at home and work.    Baseline 08/13/2020- will need new HEP with new Dx- Has previous HEP for recent Cervical issues- will modify as appropriate. 4/11: HEP compliant    Time 6    Period Weeks    Status Partially Met     Target Date 09/25/20             PT Long Term Goals - 09/07/20 0840      PT LONG TERM GOAL #1   Title Patient will increase FOTO score to equal to or greater than  66/100   to demonstrate statistically significant improvement in mobility and quality of life.    Baseline 08/14/2020: FOTO score=55 4/11: 54%    Time 12    Period Weeks    Status Partially Met    Target Date 11/06/20      PT LONG TERM GOAL #2   Title Pt will demonstrate decrease in NDI by at least 19% in order to demonstrate clinically significant reduction in disability related to neck  injury/pain    Baseline 08/14/2020: NDI= 54% 4/11: 30%    Time 12    Period Weeks    Status Partially Met    Target Date 11/06/20      PT LONG TERM GOAL #3   Title Pt will decrease worst neck pain as reported on NPRS by at least 2 points in order to demonstrate clinically significant reduction in back pain.    Baseline 08/14/2020: Patient rates Left sided Neck/UT pain at 4/10 with movement. 4/11: 3/10 at worst    Time 12    Period Weeks    Status Partially Met    Target Date 11/06/20      PT LONG TERM GOAL #4   Title Patient will demonstrate improved Cervical ROM by 50% in order to improve her functional ability to turn her head or nod for return of functional capabilities including driving, working, and sleeping.    Baseline 08/14/2020= AROM cervical flex= 20deg, ext=25, LF= 10 deg Left/20 deg Right, Rotation= 42 deg L/50 deg R 4/11; see note    Time 12    Period Weeks    Status Partially Met    Target Date 11/06/20                 Plan - 09/22/20 1254    Clinical Impression Statement Patient tolerated progressive muscle tissue lengthening techniques without pain. She continues to be progressing with decreased pain levels and no radiating of symptoms this session. Patient reports decreased pain by end of session.  She will benefit from continued skilled PT services to progress her ROM, pain relief and assist in returning  patient to her previous level of function in home and at work    Personal Factors and Comorbidities Comorbidity 1;Comorbidity 2;Past/Current Experience;Profession    Comorbidities HTN, Previous neck surgery    Examination-Activity Limitations Bathing;Bed Mobility;Caring for Others;Carry;Dressing;Lift;Reach Overhead;Sleep    Examination-Participation Restrictions Cleaning;Community Activity;Driving;Laundry;Occupation;Yard Work    Stability/Clinical Decision Making Stable/Uncomplicated    Rehab Potential Good    PT Frequency 2x / week    PT Duration 12 weeks    PT Treatment/Interventions ADLs/Self Care Home Management;Cryotherapy;Electrical Stimulation;Moist Heat;Functional mobility training;Therapeutic activities;Therapeutic exercise;Patient/family education;Manual techniques;Passive range of motion;Dry needling;Taping;Spinal Manipulations;Joint Manipulations    PT Next Visit Plan Continue with manual techniques and therapeutic exercises for improved ROM/UE/postural strengthening    PT Home Exercise Plan Reviewed previously instructed Cervical ROM.    Consulted and Agree with Plan of Care Patient           Patient will benefit from skilled therapeutic intervention in order to improve the following deficits and impairments:  Decreased activity tolerance,Decreased endurance,Decreased mobility,Decreased range of motion,Decreased strength,Hypomobility,Impaired UE functional use  Visit Diagnosis: Cervicalgia  Muscle weakness (generalized)  Radiculopathy, cervical region     Problem List Patient Active Problem List   Diagnosis Date Noted  . PFO (patent foramen ovale)   . CAD (coronary artery disease), native coronary artery   . Acute CVA (cerebrovascular accident) ([place]) 06/15/2018  . Depression 10/16/2017  . Migraine 10/16/2017  . Hyperglycemia, unspecified 07/10/2017  . Hyperglycemia 07/10/2017  . H/O dizziness 08/02/2016  . Cervical disc disorder with radiculopathy of cervical  region 04/12/2016  . Nonallopathic lesion of thoracic region 04/12/2016  . Nonallopathic lesion of rib cage 04/12/2016  . Facet arthritis of cervical region 03/21/2016  . Hyperlipidemia 01/26/2015  . Labral tear of shoulder, right, subsequent encounter 12/04/2014  . 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 Impingement syndrome of right shoulder 05/28/2014  . Right  supraspinatus tenosynovitis 05/28/2014  . Arthritis pain 04/03/2014  . Fatigue 04/03/2014  . 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 IBS (irritable bowel syndrome) 06/20/2012  . Lumbar disc disease 06/20/2012   Janna Arch, PT, DPT   09/22/2020, 12:55 PM  [place] MAIN [facility] SERVICES [address] [place], [place], [address] Phone: [phone]   Fax:  [phone]  Name: Lori Morales MRN: [id number] Date of Birth: Jan 29, 1961

## 2020-09-24 ENCOUNTER — Ambulatory Visit: Payer: PRIVATE HEALTH INSURANCE

## 2020-09-24 DIAGNOSIS — M6281 Muscle weakness (generalized): Secondary | ICD-10-CM

## 2020-09-24 DIAGNOSIS — M542 Cervicalgia: Secondary | ICD-10-CM

## 2020-09-24 DIAGNOSIS — M5412 Radiculopathy, cervical region: Secondary | ICD-10-CM

## 2020-09-24 NOTE — Therapy (Signed)
[place] MAIN [facility] SERVICES [address] [place], [place], [address] Phone: [phone]   Fax:  [phone]  Physical Therapy Treatment  Patient Details  Name: Lori Morales MRN: [id number] Date of Birth: April 07, 1961 Referring Provider (PT): Lily Kocher, [place]   Encounter Date: 09/24/2020   PT End of Session - 09/24/20 0954    Visit Number 13    Number of Visits 25    Date for PT Re-Evaluation 11/06/20    Authorization Type Initial cert: 2/53/6644- 0/34/7425    Authorization Time Period 4/8 approved from workers comp    PT Start Time 0846    PT Stop Time 0930    PT Time Calculation (min) 44 min    Activity Tolerance No increased pain;Patient tolerated treatment well    Behavior During Therapy [facility] for tasks assessed/performed           Past Medical History:  Diagnosis Date  . Arthritis   . CAD (coronary artery disease), native coronary artery    mild non calcified plaque in mid LAD and prox LCx on coronary CTA 01/2019  . Depression   . GERD (gastroesophageal reflux disease)   . Headache    migraines  . Hx of squamous cell carcinoma 07/11/2016   Right distal dorsum lat forearm   . Hypertension   . IBS (irritable bowel syndrome)   . Palpitations   . PFO (patent foramen ovale)    small by cardiac CTA 01/2019  . PONV (postoperative nausea and vomiting)   . Restless legs syndrome   . Squamous cell carcinoma of skin 12/202/2017   right distal dorsum lateral forearm    Past Surgical History:  Procedure Laterality Date  . ABDOMINOPLASTY    . BACK SURGERY     1 laminectomy, 1 fusion  . COLONOSCOPY    . COLONOSCOPY WITH PROPOFOL N/A 01/17/2017   Procedure: COLONOSCOPY WITH PROPOFOL;  Surgeon: Lollie Sails, MD;  Location: [facility] ENDOSCOPY;  Service: Endoscopy;  Laterality: N/A;  . WISDOM TOOTH EXTRACTION      There were no vitals filed for this visit.   Subjective Assessment - 09/24/20 0953    Subjective Patient reports  L shoulder is challenged with abduction and flexion, is becoming more apparent as her neck is improving.    Pertinent History Patient has PMH: significant for C6-7 arthroplasty. PMH significant for arthritis, depression,IBS, Squamous cell carcinoma, Hypertension.    Limitations Lifting;Writing;House hold activities;Other (comment)   Patient reports 10lb lifting restriction.   How long can you sit comfortably? Increased pain if performing deskwork for > 1/2 day.    How long can you stand comfortably? No issues    How long can you walk comfortably? No issues    Diagnostic tests None    Patient Stated Goals To be painfree and work without pain or restrictions as well as sleep better.    Currently in Pain? Yes    Pain Score 1     Pain Location Neck    Pain Orientation Right    Pain Descriptors / Indicators Aching    Pain Type Chronic pain    Pain Onset 1 to 4 weeks ago    Pain Frequency Intermittent              supine: use of heat pad under upper thoracic lower cervical region Cervical sidebend with gentle overpressure 4x60 second holds each direction  cervical rotation with gentle overpressure 4x 60 second holds each direction Suboccipital release  3x30 second holds  AP, PA, and inferior L GH grade II mobilizations 2x10 seconds each direction  median nerve glide LUE 10x   Prone:    STM to upper trap, levator scap, cervical paraspinals, and infraspinatus x12 minutes with implementation of effleurage and ptrissage for muscle tissue tension reduction.   Seated: Distraction LUE 2x20 seconds; patient reports pain relieving  Standing:  Table distraction in flexion 2x 30 seconds Table distraction with abduction 2x30 seconds     Pt educated throughout session about proper posture and technique with exercises. Improved exercise technique, movement at target joints, use of target muscles after min to mod verbal, visual, tactile cues.     Trigger Point Dry Needling (TDN),  unbilled Education performed with patient regarding potential benefit of TDN. Reviewed precautions and risks with patient. Reviewed special precautions/risks over lung fields which include pneumothorax. Reviewed signs and symptoms of pneumothorax and advised pt to go to ER immediately if these symptoms develop advise them of dry needling treatment. Extensive time spent with pt to ensure full understanding of TDN risks. Pt provided verbal consent to treatment. TDN performed to  with 0.25 x .40 single needle placements with local twitch response (LTR) to L and R cervical paraspinal, L upper trap and L infraspinatus . Pistoning technique utilized. Improved pain-free motion following intervention. x5 minutes                           PT Education - 09/24/20 0954    Education Details exercise technique, body mechanics    Person(s) Educated Patient    Methods Explanation;Demonstration;Tactile cues;Verbal cues    Comprehension Verbalized understanding;Returned demonstration;Verbal cues required;Tactile cues required            PT Short Term Goals - 09/07/20 0842      PT SHORT TERM GOAL #1   Title Pt will be independent with HEP in order to improve strength and decrease back pain in order to improve pain-free function at home and work.    Baseline 08/13/2020- will need new HEP with new Dx- Has previous HEP for recent Cervical issues- will modify as appropriate. 4/11: HEP compliant    Time 6    Period Weeks    Status Partially Met    Target Date 09/25/20             PT Long Term Goals - 09/07/20 0840      PT LONG TERM GOAL #1   Title Patient will increase FOTO score to equal to or greater than  66/100   to demonstrate statistically significant improvement in mobility and quality of life.    Baseline 08/14/2020: FOTO score=55 4/11: 54%    Time 12    Period Weeks    Status Partially Met    Target Date 11/06/20      PT LONG TERM GOAL #2   Title Pt will demonstrate  decrease in NDI by at least 19% in order to demonstrate clinically significant reduction in disability related to neck injury/pain    Baseline 08/14/2020: NDI= 54% 4/11: 30%    Time 12    Period Weeks    Status Partially Met    Target Date 11/06/20      PT LONG TERM GOAL #3   Title Pt will decrease worst neck pain as reported on NPRS by at least 2 points in order to demonstrate clinically significant reduction in back pain.    Baseline 08/14/2020: Patient rates Left  sided Neck/UT pain at 4/10 with movement. 4/11: 3/10 at worst    Time 12    Period Weeks    Status Partially Met    Target Date 11/06/20      PT LONG TERM GOAL #4   Title Patient will demonstrate improved Cervical ROM by 50% in order to improve her functional ability to turn her head or nod for return of functional capabilities including driving, working, and sleeping.    Baseline 08/14/2020= AROM cervical flex= 20deg, ext=25, LF= 10 deg Left/20 deg Right, Rotation= 42 deg L/50 deg R 4/11; see note    Time 12    Period Weeks    Status Partially Met    Target Date 11/06/20                 Plan - 09/24/20 0956    Clinical Impression Statement Patient has pain with L arm abduction and flexion that has become more apparent as her pain in her neck is decreasing. Distraction techniques taught with patient demonstrating understanding. Multiple twitch responses noted with dry needling her L and R paraspinals as well as infraspinatus. Patient is not yet at 100% but is returning closer to her baseline every session.   She will benefit from continued skilled PT services to progress her ROM, pain relief and assist in returning patient to her previous level of function in home and at work. She will benefit from continued skilled PT services to progress her ROM, pain relief and assist in returning patient to her previous level of function in home and at work    Personal Factors and Comorbidities Comorbidity 1;Comorbidity 2;Past/Current  Experience;Profession    Comorbidities HTN, Previous neck surgery    Examination-Activity Limitations Bathing;Bed Mobility;Caring for Others;Carry;Dressing;Lift;Reach Overhead;Sleep    Examination-Participation Restrictions Cleaning;Community Activity;Driving;Laundry;Occupation;Yard Work    Stability/Clinical Decision Making Stable/Uncomplicated    Rehab Potential Good    PT Frequency 2x / week    PT Duration 12 weeks    PT Treatment/Interventions ADLs/Self Care Home Management;Cryotherapy;Electrical Stimulation;Moist Heat;Functional mobility training;Therapeutic activities;Therapeutic exercise;Patient/family education;Manual techniques;Passive range of motion;Dry needling;Taping;Spinal Manipulations;Joint Manipulations    PT Next Visit Plan Continue with manual techniques and therapeutic exercises for improved ROM/UE/postural strengthening    PT Home Exercise Plan Reviewed previously instructed Cervical ROM.    Consulted and Agree with Plan of Care Patient           Patient will benefit from skilled therapeutic intervention in order to improve the following deficits and impairments:  Decreased activity tolerance,Decreased endurance,Decreased mobility,Decreased range of motion,Decreased strength,Hypomobility,Impaired UE functional use  Visit Diagnosis: Cervicalgia  Muscle weakness (generalized)  Radiculopathy, cervical region     Problem List Patient Active Problem List   Diagnosis Date Noted  . PFO (patent foramen ovale)   . CAD (coronary artery disease), native coronary artery   . Acute CVA (cerebrovascular accident) ([place]) 06/15/2018  . Depression 10/16/2017  . Migraine 10/16/2017  . Hyperglycemia, unspecified 07/10/2017  . Hyperglycemia 07/10/2017  . H/O dizziness 08/02/2016  . Cervical disc disorder with radiculopathy of cervical region 04/12/2016  . Nonallopathic lesion of thoracic region 04/12/2016  . Nonallopathic lesion of rib cage 04/12/2016  . Facet arthritis of  cervical region 03/21/2016  . Hyperlipidemia 01/26/2015  . Labral tear of shoulder, right, subsequent encounter 12/04/2014  . 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 Impingement syndrome of right shoulder 05/28/2014  . Right supraspinatus tenosynovitis 05/28/2014  . Arthritis pain 04/03/2014  . Fatigue 04/03/2014  . 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  IBS (irritable bowel syndrome) 06/20/2012  . Lumbar disc disease 06/20/2012   Janna Arch, PT, DPT   09/24/2020, 9:57 AM  [place] MAIN [facility] SERVICES [address] [place], [place], [address] Phone: [phone]   Fax:  [phone]  Name: BRIENNA BASS MRN: [id number] Date of Birth: 02/01/1961

## 2020-09-29 ENCOUNTER — Other Ambulatory Visit: Payer: Self-pay

## 2020-09-29 ENCOUNTER — Ambulatory Visit: Payer: PRIVATE HEALTH INSURANCE | Attending: Physician Assistant

## 2020-09-29 DIAGNOSIS — M542 Cervicalgia: Secondary | ICD-10-CM | POA: Diagnosis present

## 2020-09-29 DIAGNOSIS — M6281 Muscle weakness (generalized): Secondary | ICD-10-CM

## 2020-09-29 DIAGNOSIS — M5412 Radiculopathy, cervical region: Secondary | ICD-10-CM | POA: Diagnosis present

## 2020-09-29 MED FILL — Pramipexole Dihydrochloride Tab 0.25 MG: ORAL | 45 days supply | Qty: 90 | Fill #0 | Status: AC

## 2020-09-29 MED FILL — Amitriptyline HCl Tab 10 MG: ORAL | 90 days supply | Qty: 90 | Fill #0 | Status: AC

## 2020-09-29 MED FILL — Zolpidem Tartrate Tab ER 6.25 MG: ORAL | 30 days supply | Qty: 30 | Fill #0 | Status: AC

## 2020-09-29 NOTE — Therapy (Signed)
[place] MAIN [facility] SERVICES [address] [place], [place], [address] Phone: [phone]   Fax:  [phone]  Physical Therapy Treatment  Patient Details  Name: Lori Morales MRN: [id number] Date of Birth: [birth year] Referring Provider (PT): Lily Kocher, [place]   Encounter Date: 09/29/2020   PT End of Session - 09/29/20 1125    Visit Number 14    Number of Visits 25    Date for PT Re-Evaluation 11/06/20    Authorization Type Initial cert: 05/31/5850- 7/78/2423    Authorization Time Period 6/8 approved from workers comp    PT Start Time 1101    PT Stop Time 1146    PT Time Calculation (min) 45 min    Activity Tolerance No increased pain;Patient tolerated treatment well    Behavior During Therapy [facility] for tasks assessed/performed           Past Medical History:  Diagnosis Date  . Arthritis   . CAD (coronary artery disease), native coronary artery    mild non calcified plaque in mid LAD and prox LCx on coronary CTA 01/2019  . Depression   . GERD (gastroesophageal reflux disease)   . Headache    migraines  . Hx of squamous cell carcinoma 07/11/2016   Right distal dorsum lat forearm   . Hypertension   . IBS (irritable bowel syndrome)   . Palpitations   . PFO (patent foramen ovale)    small by cardiac CTA 01/2019  . PONV (postoperative nausea and vomiting)   . Restless legs syndrome   . Squamous cell carcinoma of skin 12/202/2017   right distal dorsum lateral forearm    Past Surgical History:  Procedure Laterality Date  . ABDOMINOPLASTY    . BACK SURGERY     1 laminectomy, 1 fusion  . COLONOSCOPY    . COLONOSCOPY WITH PROPOFOL N/A 01/17/2017   Procedure: COLONOSCOPY WITH PROPOFOL;  Surgeon: Lollie Sails, MD;  Location: [facility] ENDOSCOPY;  Service: Endoscopy;  Laterality: N/A;  . WISDOM TOOTH EXTRACTION      There were no vitals filed for this visit.   Subjective Assessment - 09/29/20 1126    Subjective Patient reports  compliance with HEP. Had some spasming down R side of neck but has mostly resolved.    Pertinent History Patient has PMH: significant for C6-7 arthroplasty. PMH significant for arthritis, depression,IBS, Squamous cell carcinoma, Hypertension.    Limitations Lifting;Writing;House hold activities;Other (comment)   Patient reports 10lb lifting restriction.   How long can you sit comfortably? Increased pain if performing deskwork for > 1/2 day.    How long can you stand comfortably? No issues    How long can you walk comfortably? No issues    Diagnostic tests None    Patient Stated Goals To be painfree and work without pain or restrictions as well as sleep better.    Currently in Pain? Yes    Pain Score 1     Pain Location Neck    Pain Orientation Right    Pain Descriptors / Indicators Aching    Pain Type Chronic pain    Pain Onset 1 to 4 weeks ago    Pain Frequency Intermittent                 supine: use of heat pad under upper thoracic lower cervical region Cervical sidebend with gentle overpressure 4x60 second holds each direction  cervical rotation with gentle overpressure 4x 60 second holds each direction Suboccipital  release 3x30 second holds  AP, PA, and inferior L GH grade II mobilizations 2x10 seconds each direction L median nerve glide 20x  RTB ER 15x RTB overhead stabilization reach /arc 10x Scapular retraction and cervicalchin tuck10x 5 second holds cervical extension with towel under neck 10x   Prone:  STM to upper trap, levator scap, cervical paraspinals x9 minutes with implementation of effleurage and ptrissage for muscle tissue tension reduction.  J mobilization to upper thoracic/lower cervical for reduced thoracic kyphosis. 3x30 seconds  Seated: Trunk extension over chair 10x RTB row 15x French pectoral stretch 10x   Standing:  Wall abduction: painful >90; reduced to 45 degrees      Pt educated throughout session about proper posture and technique  with exercises. Improved exercise technique, movement at target joints, use of target muscles after min to mod verbal, visual, tactile cues.     Trigger Point Dry Needling (TDN), unbilled Education performed with patient regarding potential benefit of TDN. Reviewed precautions and risks with patient. Reviewed special precautions/risks over lung fields which include pneumothorax. Reviewed signs and symptoms of pneumothorax and advised pt to go to ER immediately if these symptoms develop advise them of dry needling treatment. Extensive time spent with pt to ensure full understanding of TDN risks. Pt provided verbal consent to treatment. TDN performed to  with 0.25 x .40 single needle placements with local twitch response (LTR) to L and R cervical paraspinal,. Pistoning technique utilized. Improved pain-free motion following intervention. x3 minutes   Patient tolerated progressive postural and alignment interventions with decreased pain reported at end of session. Multiple trigger points found in R and L cervical paraspinals. She continues to progress to her baseline and however has limited abduction due to pain when raising >90 degrees. She will benefit from continued skilled PT services to progress her ROM, pain relief and assist in returning patient to her previous level of function in home and at work.                   PT Education - 09/29/20 1126    Education Details exercise technique, body mechanics, manual    Person(s) Educated Patient    Methods Explanation;Demonstration;Tactile cues;Verbal cues    Comprehension Verbalized understanding;Returned demonstration;Verbal cues required;Tactile cues required            PT Short Term Goals - 09/07/20 0842      PT SHORT TERM GOAL #1   Title Pt will be independent with HEP in order to improve strength and decrease back pain in order to improve pain-free function at home and work.    Baseline 08/13/2020- will need new HEP with new  Dx- Has previous HEP for recent Cervical issues- will modify as appropriate. 4/11: HEP compliant    Time 6    Period Weeks    Status Partially Met    Target Date 09/25/20             PT Long Term Goals - 09/07/20 0840      PT LONG TERM GOAL #1   Title Patient will increase FOTO score to equal to or greater than  66/100   to demonstrate statistically significant improvement in mobility and quality of life.    Baseline 08/14/2020: FOTO score=55 4/11: 54%    Time 12    Period Weeks    Status Partially Met    Target Date 11/06/20      PT LONG TERM GOAL #2   Title Pt will demonstrate decrease  in [place] by at least 19% in order to demonstrate clinically significant reduction in disability related to neck injury/pain    Baseline 08/14/2020: NDI= 54% 4/11: 30%    Time 12    Period Weeks    Status Partially Met    Target Date 11/06/20      PT LONG TERM GOAL #3   Title Pt will decrease worst neck pain as reported on NPRS by at least 2 points in order to demonstrate clinically significant reduction in back pain.    Baseline 08/14/2020: Patient rates Left sided Neck/UT pain at 4/10 with movement. 4/11: 3/10 at worst    Time 12    Period Weeks    Status Partially Met    Target Date 11/06/20      PT LONG TERM GOAL #4   Title Patient will demonstrate improved Cervical ROM by 50% in order to improve her functional ability to turn her head or nod for return of functional capabilities including driving, working, and sleeping.    Baseline 08/14/2020= AROM cervical flex= 20deg, ext=25, LF= 10 deg Left/20 deg Right, Rotation= 42 deg L/50 deg R 4/11; see note    Time 12    Period Weeks    Status Partially Met    Target Date 11/06/20                 Plan - 09/29/20 1240    Clinical Impression Statement Patient tolerated progressive postural and alignment interventions with decreased pain reported at end of session. Multiple trigger points found in R and L cervical paraspinals. She continues  to progress to her baseline and however has limited abduction due to pain when raising >90 degrees. She will benefit from continued skilled PT services to progress her ROM, pain relief and assist in returning patient to her previous level of function in home and at work.    Personal Factors and Comorbidities Comorbidity 1;Comorbidity 2;Past/Current Experience;Profession    Comorbidities HTN, Previous neck surgery    Examination-Activity Limitations Bathing;Bed Mobility;Caring for Others;Carry;Dressing;Lift;Reach Overhead;Sleep    Examination-Participation Restrictions Cleaning;Community Activity;Driving;Laundry;Occupation;Yard Work    Stability/Clinical Decision Making Stable/Uncomplicated    Rehab Potential Good    PT Frequency 2x / week    PT Duration 12 weeks    PT Treatment/Interventions ADLs/Self Care Home Management;Cryotherapy;Electrical Stimulation;Moist Heat;Functional mobility training;Therapeutic activities;Therapeutic exercise;Patient/family education;Manual techniques;Passive range of motion;Dry needling;Taping;Spinal Manipulations;Joint Manipulations    PT Next Visit Plan Continue with manual techniques and therapeutic exercises for improved ROM/UE/postural strengthening    PT Home Exercise Plan Reviewed previously instructed Cervical ROM.    Consulted and Agree with Plan of Care Patient           Patient will benefit from skilled therapeutic intervention in order to improve the following deficits and impairments:  Decreased activity tolerance,Decreased endurance,Decreased mobility,Decreased range of motion,Decreased strength,Hypomobility,Impaired UE functional use  Visit Diagnosis: Cervicalgia  Muscle weakness (generalized)  Radiculopathy, cervical region     Problem List Patient Active Problem List   Diagnosis Date Noted  . PFO (patent foramen ovale)   . CAD (coronary artery disease), native coronary artery   . Acute CVA (cerebrovascular accident) ([place]) 06/15/2018   . Depression 10/16/2017  . Migraine 10/16/2017  . Hyperglycemia, unspecified 07/10/2017  . Hyperglycemia 07/10/2017  . H/O dizziness 08/02/2016  . Cervical disc disorder with radiculopathy of cervical region 04/12/2016  . Nonallopathic lesion of thoracic region 04/12/2016  . Nonallopathic lesion of rib cage 04/12/2016  . Facet arthritis of cervical region 03/21/2016  .  Hyperlipidemia 01/26/2015  . Labral tear of shoulder, right, subsequent encounter 12/04/2014  . 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 Impingement syndrome of right shoulder 05/28/2014  . Right supraspinatus tenosynovitis 05/28/2014  . Arthritis pain 04/03/2014  . Fatigue 04/03/2014  . 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 IBS (irritable bowel syndrome) 06/20/2012  . Lumbar disc disease 06/20/2012   Janna Arch, PT, DPT   09/29/2020, 12:41 PM  [place] MAIN [facility] SERVICES [address] [place], [place], [address] Phone: [phone]   Fax:  [phone]  Name: ISIDRA MINGS MRN: [id number] Date of Birth: 07-25-1960

## 2020-09-30 ENCOUNTER — Other Ambulatory Visit: Payer: Self-pay

## 2020-09-30 MED ORDER — ESTRADIOL 0.1 MG/GM VA CREA
TOPICAL_CREAM | VAGINAL | 4 refills | Status: DC
  Filled 2020-09-30: qty 42.5, 30d supply, fill #0

## 2020-09-30 MED FILL — Estradiol & Norethindrone Acetate Tab 0.5-0.1 MG: ORAL | 84 days supply | Qty: 84 | Fill #0 | Status: AC

## 2020-10-01 ENCOUNTER — Encounter: Payer: Self-pay | Admitting: Dermatology

## 2020-10-01 ENCOUNTER — Other Ambulatory Visit: Payer: Self-pay

## 2020-10-01 ENCOUNTER — Ambulatory Visit: Payer: 59 | Admitting: Dermatology

## 2020-10-01 DIAGNOSIS — D489 Neoplasm of uncertain behavior, unspecified: Secondary | ICD-10-CM

## 2020-10-01 DIAGNOSIS — L578 Other skin changes due to chronic exposure to nonionizing radiation: Secondary | ICD-10-CM

## 2020-10-01 MED ORDER — TRIAMCINOLONE ACETONIDE 0.1 % EX CREA
TOPICAL_CREAM | CUTANEOUS | 0 refills | Status: DC
  Filled 2020-10-01: qty 30, 14d supply, fill #0
  Filled 2020-10-16: qty 30, 15d supply, fill #0

## 2020-10-01 NOTE — Progress Notes (Signed)
   Follow-Up Visit   Subjective  Lori Morales is a 60 y.o. female who presents for the following: 1 month follow up (Patient here today concerning spot on right 2nd finger dip. She had area treated with ln2 at last appointment. Today she reports area is still very sore and tender. She denies other new concerns today at follow up.).   The following portions of the chart were reviewed this encounter and updated as appropriate:  Tobacco  Allergies  Meds  Problems  Med Hx  Surg Hx  Fam Hx       Objective  Well appearing patient in no apparent distress; mood and affect are within normal limits.  A focused examination was performed including right 2nd finger DIP. Relevant physical exam findings are noted in the Assessment and Plan.  Objective  Right 2nd Finger Distal Interphalangeal Joint: Rounded pink papule with central crust overlying joint  Assessment & Plan  Neoplasm of uncertain behavior Right 2nd Finger Distal Interphalangeal Joint  triamcinolone cream (KENALOG) 0.1 %  Favor digital mucous cyst. No improvement s/p cryotherapy. Recommend referral to ortho for further evaluation and treatment.   Start Triamcinolone cream 0.1 % cream apply daily under bandaid for up to 3 weeks to spot at finger. Avoid applying to face, groin, and axilla. Can also use twice a day as needed for but bites up to 2 weeks.   Topical steroids (such as triamcinolone, fluocinolone, fluocinonide, mometasone, clobetasol, halobetasol, betamethasone, hydrocortisone) can cause thinning and lightening of the skin if they are used for too long in the same area. Your physician has selected the right strength medicine for your problem and area affected on the body. Please use your medication only as directed by your physician to prevent side effects.   Actinic Damage - chronic, secondary to cumulative UV radiation exposure/sun exposure over time - diffuse scaly erythematous macules with underlying  dyspigmentation - Recommend daily broad spectrum sunscreen SPF 30+ to sun-exposed areas, reapply every 2 hours as needed.  - Recommend staying in the shade or wearing long sleeves, sun glasses (UVA+UVB protection) and wide brim hats (4-inch brim around the entire circumference of the hat). - Call for new or changing lesions. - Recommend taking Heliocare sun protection supplement daily in sunny weather for additional sun protection. For maximum protection on the sunniest days, you can take up to 2 capsules of regular Heliocare OR take 1 capsule of Heliocare Ultra. For prolonged exposure (such as a full day in the sun), you can repeat your dose of the supplement 4 hours after your first dose.  Return for keep follow up as scheduled.  I, Ruthell Rummage, CMA, am acting as scribe for Forest Gleason, MD.  Documentation: I have reviewed the above documentation for accuracy and completeness, and I agree with the above.  Forest Gleason, MD

## 2020-10-01 NOTE — Patient Instructions (Addendum)
Recommend daily broad spectrum sunscreen SPF 30+ to sun-exposed areas, reapply every 2 hours as needed. Call for new or changing lesions.  Staying in the shade or wearing long sleeves, sun glasses (UVA+UVB protection) and wide brim hats (4-inch brim around the entire circumference of the hat) are also recommended for sun protection.   Recommend taking Heliocare sun protection supplement daily in sunny weather for additional sun protection. For maximum protection on the sunniest days, you can take up to 2 capsules of regular Heliocare OR take 1 capsule of Heliocare Ultra. For prolonged exposure (such as a full day in the sun), you can repeat your dose of the supplement 4 hours after your first dose. Heliocare can be purchased at [facility] or at [email or web address].    Topical steroids (such as triamcinolone, fluocinolone, fluocinonide, mometasone, clobetasol, halobetasol, betamethasone, hydrocortisone) can cause thinning and lightening of the skin if they are used for too long in the same area. Your physician has selected the right strength medicine for your problem and area affected on the body. Please use your medication only as directed by your physician to prevent side effects.   Avoid applying to face, groin, and axilla. Use as directed. Risk of skin atrophy with long-term use reviewed.      If you have any questions or concerns for your doctor, please call our main line at [phone] and press option 4 to reach your doctor's medical assistant. If no one answers, please leave a voicemail as directed and we will return your call as soon as possible. Messages left after 4 pm will be answered the following business day.   You may also send [place] a message via [place]. We typically respond to MyChart messages within 1-2 business days.  For prescription refills, please ask your pharmacy to contact our office. Our fax number is [phone].  If you have an urgent issue when the clinic is  closed that cannot wait until the next business day, you can page your doctor at the number below.    Please note that while we do our best to be available for urgent issues outside of office hours, we are not available 24/7.   If you have an urgent issue and are unable to reach [place], you may choose to seek medical care at your doctor's office, retail clinic, urgent care center, or emergency room.  If you have a medical emergency, please immediately call 911 or go to the emergency department.  Pager Numbers  - Dr. Nehemiah Massed: [phone]  - Dr. Laurence Ferrari: [phone]  - Dr. Nicole Kindred: [phone]  In the event of inclement weather, please call our main line at [phone] for an update on the status of any delays or closures.  Dermatology Medication Tips: Please keep the boxes that topical medications come in in order to help keep track of the instructions about where and how to use these. Pharmacies typically print the medication instructions only on the boxes and not directly on the medication tubes.   If your medication is too expensive, please contact our office at [phone] option 4 or send [place] a message through [place].   We are unable to tell what your co-pay for medications will be in advance as this is different depending on your insurance coverage. However, we may be able to find a substitute medication at lower cost or fill out paperwork to get insurance to cover a needed medication.   If a prior authorization is required to get your medication covered by your  insurance company, please allow [place] 1-2 business days to complete this process.  Drug prices often vary depending on where the prescription is filled and some pharmacies may offer cheaper prices.  The website www.goodrx.com contains coupons for medications through different pharmacies. The prices here do not account for what the cost may be with help from insurance (it may be cheaper with your insurance), but the website can  give you the price if you did not use any insurance.  - You can print the associated coupon and take it with your prescription to the pharmacy.  - You may also stop by our office during regular business hours and pick up a GoodRx coupon card.  - If you need your prescription sent electronically to a different pharmacy, notify our office through [facility] or by phone at [phone] option 4.

## 2020-10-05 ENCOUNTER — Ambulatory Visit: Payer: PRIVATE HEALTH INSURANCE | Attending: Physician Assistant

## 2020-10-05 ENCOUNTER — Other Ambulatory Visit: Payer: Self-pay

## 2020-10-05 ENCOUNTER — Telehealth: Payer: Self-pay

## 2020-10-05 DIAGNOSIS — M5412 Radiculopathy, cervical region: Secondary | ICD-10-CM | POA: Diagnosis present

## 2020-10-05 DIAGNOSIS — M6281 Muscle weakness (generalized): Secondary | ICD-10-CM

## 2020-10-05 DIAGNOSIS — M542 Cervicalgia: Secondary | ICD-10-CM | POA: Diagnosis present

## 2020-10-05 DIAGNOSIS — D489 Neoplasm of uncertain behavior, unspecified: Secondary | ICD-10-CM

## 2020-10-05 NOTE — Telephone Encounter (Signed)
Referral faxed to [facility] Dr Kurtis Bushman

## 2020-10-05 NOTE — Therapy (Signed)
[place] MAIN [facility] SERVICES [address] [place], [place], [address] Phone: [phone]   Fax:  [phone]  Physical Therapy Treatment  Patient Details  Name: Lori Morales MRN: [id number] Date of Birth: 03-22-1961 Referring Provider (PT): Lily Kocher, [place]   Encounter Date: 10/05/2020   PT End of Session - 10/05/20 1423    Visit Number 15    Number of Visits 25    Date for PT Re-Evaluation 11/06/20    Authorization Type Initial cert: 08/26/5186- 09/12/6061    Authorization Time Period 7/8 approved from workers comp    PT Start Time 1345    PT Stop Time 1429    PT Time Calculation (min) 44 min    Activity Tolerance No increased pain;Patient tolerated treatment well    Behavior During Therapy [facility] for tasks assessed/performed           Past Medical History:  Diagnosis Date  . Arthritis   . CAD (coronary artery disease), native coronary artery    mild non calcified plaque in mid LAD and prox LCx on coronary CTA 01/2019  . Depression   . GERD (gastroesophageal reflux disease)   . Headache    migraines  . Hx of squamous cell carcinoma 07/11/2016   Right distal dorsum lat forearm   . Hypertension   . IBS (irritable bowel syndrome)   . Palpitations   . PFO (patent foramen ovale)    small by cardiac CTA 01/2019  . PONV (postoperative nausea and vomiting)   . Restless legs syndrome   . Squamous cell carcinoma of skin 12/202/2017   right distal dorsum lateral forearm    Past Surgical History:  Procedure Laterality Date  . ABDOMINOPLASTY    . BACK SURGERY     1 laminectomy, 1 fusion  . COLONOSCOPY    . COLONOSCOPY WITH PROPOFOL N/A 01/17/2017   Procedure: COLONOSCOPY WITH PROPOFOL;  Surgeon: Lollie Sails, MD;  Location: [facility] ENDOSCOPY;  Service: Endoscopy;  Laterality: N/A;  . WISDOM TOOTH EXTRACTION      There were no vitals filed for this visit.   Subjective Assessment - 10/05/20 1421    Subjective Patient reports  new onset of elbow pain. Had some migraines over the weekend. Is aware today is next to last approved visit.    Pertinent History Patient has PMH: significant for C6-7 arthroplasty. PMH significant for arthritis, depression,IBS, Squamous cell carcinoma, Hypertension.    Limitations Lifting;Writing;House hold activities;Other (comment)   Patient reports 10lb lifting restriction.   How long can you sit comfortably? Increased pain if performing deskwork for > 1/2 day.    How long can you stand comfortably? No issues    How long can you walk comfortably? No issues    Diagnostic tests None    Patient Stated Goals To be painfree and work without pain or restrictions as well as sleep better.    Currently in Pain? Yes    Pain Score 4     Pain Location Elbow    Pain Orientation Right    Pain Descriptors / Indicators Aching    Pain Type Chronic pain    Pain Onset 1 to 4 weeks ago    Pain Frequency Intermittent                 supine: use of heat pad under upper thoracic lower cervical region Cervical sidebend with gentle overpressure 4x60 second holds each direction  cervical rotation with gentle overpressure 4x 60  second holds each direction Suboccipital release 3x30 second holds  AP, PA, and inferior L GH grade II mobilizations 2x10 seconds each direction RTB overhead stabilization reach /arc 10x  Scapular retraction and cervical chin tuck 10x 5 second holds  cervical extension with towel under neck 10x  robber stretch 2x 30 seconds   Prone:  STM to upper trap, levator scap, cervical paraspinals x11 minutes with implementation of effleurage and ptrissage for muscle tissue tension reduction.  J mobilization to upper thoracic/lower cervical for reduced thoracic kyphosis. 3x30 seconds   Seated: Trunk extension over chair 10x French pectoral stretch 10x    Pt educated throughout session about proper posture and technique with exercises. Improved exercise technique, movement at target  joints, use of target muscles after min to mod verbal, visual, tactile cues.     Trigger Point Dry Needling (TDN), unbilled Education performed with patient regarding potential benefit of TDN. Reviewed precautions and risks with patient. Reviewed special precautions/risks over lung fields which include pneumothorax. Reviewed signs and symptoms of pneumothorax and advised pt to go to ER immediately if these symptoms develop advise them of dry needling treatment. Extensive time spent with pt to ensure full understanding of TDN risks. Pt provided verbal consent to treatment. TDN performed to  with 0.25 x .40 single needle placements with local twitch response (LTR) to L and R cervical paraspinal,upper trap, and L infraspinatus. Pistoning technique utilized. Improved pain-free motion following intervention. x5 minutes  Patient is aware that next session will be her last session approved for by workers comp. Her pain in her elbow did relieve with manual to shoulder and neck as well as TDN. Patient's posture is improving with decreased episodes of pain between session. Multiple trigger points continue to be present and release with focused intervention. She will benefit from continued skilled PT services to progress her ROM, pain relief and assist in returning patient to her previous level of function in home and at work.                       PT Education - 10/05/20 1423    Education Details exercise technique, body mechanics    Person(s) Educated Patient    Methods Explanation;Demonstration;Tactile cues;Verbal cues    Comprehension Verbalized understanding;Returned demonstration;Verbal cues required;Tactile cues required            PT Short Term Goals - 09/07/20 0842      PT SHORT TERM GOAL #1   Title Pt will be independent with HEP in order to improve strength and decrease back pain in order to improve pain-free function at home and work.    Baseline 08/13/2020- will need new HEP  with new Dx- Has previous HEP for recent Cervical issues- will modify as appropriate. 4/11: HEP compliant    Time 6    Period Weeks    Status Partially Met    Target Date 09/25/20             PT Long Term Goals - 09/07/20 0840      PT LONG TERM GOAL #1   Title Patient will increase FOTO score to equal to or greater than  66/100   to demonstrate statistically significant improvement in mobility and quality of life.    Baseline 08/14/2020: FOTO score=55 4/11: 54%    Time 12    Period Weeks    Status Partially Met    Target Date 11/06/20      PT LONG TERM GOAL #  2   Title Pt will demonstrate decrease in NDI by at least 19% in order to demonstrate clinically significant reduction in disability related to neck injury/pain    Baseline 08/14/2020: NDI= 54% 4/11: 30%    Time 12    Period Weeks    Status Partially Met    Target Date 11/06/20      PT LONG TERM GOAL #3   Title Pt will decrease worst neck pain as reported on NPRS by at least 2 points in order to demonstrate clinically significant reduction in back pain.    Baseline 08/14/2020: Patient rates Left sided Neck/UT pain at 4/10 with movement. 4/11: 3/10 at worst    Time 12    Period Weeks    Status Partially Met    Target Date 11/06/20      PT LONG TERM GOAL #4   Title Patient will demonstrate improved Cervical ROM by 50% in order to improve her functional ability to turn her head or nod for return of functional capabilities including driving, working, and sleeping.    Baseline 08/14/2020= AROM cervical flex= 20deg, ext=25, LF= 10 deg Left/20 deg Right, Rotation= 42 deg L/50 deg R 4/11; see note    Time 12    Period Weeks    Status Partially Met    Target Date 11/06/20                 Plan - 10/05/20 1528    Clinical Impression Statement Patient is aware that next session will be her last session approved for by workers comp. Her pain in her elbow did relieve with manual to shoulder and neck as well as TDN. Patient's  posture is improving with decreased episodes of pain between session. Multiple trigger points continue to be present and release with focused intervention. She will benefit from continued skilled PT services to progress her ROM, pain relief and assist in returning patient to her previous level of function in home and at work.    Personal Factors and Comorbidities Comorbidity 1;Comorbidity 2;Past/Current Experience;Profession    Comorbidities HTN, Previous neck surgery    Examination-Activity Limitations Bathing;Bed Mobility;Caring for Others;Carry;Dressing;Lift;Reach Overhead;Sleep    Examination-Participation Restrictions Cleaning;Community Activity;Driving;Laundry;Occupation;Yard Work    Stability/Clinical Decision Making Stable/Uncomplicated    Rehab Potential Good    PT Frequency 2x / week    PT Duration 12 weeks    PT Treatment/Interventions ADLs/Self Care Home Management;Cryotherapy;Electrical Stimulation;Moist Heat;Functional mobility training;Therapeutic activities;Therapeutic exercise;Patient/family education;Manual techniques;Passive range of motion;Dry needling;Taping;Spinal Manipulations;Joint Manipulations    PT Next Visit Plan Continue with manual techniques and therapeutic exercises for improved ROM/UE/postural strengthening    PT Home Exercise Plan Reviewed previously instructed Cervical ROM.    Consulted and Agree with Plan of Care Patient           Patient will benefit from skilled therapeutic intervention in order to improve the following deficits and impairments:  Decreased activity tolerance,Decreased endurance,Decreased mobility,Decreased range of motion,Decreased strength,Hypomobility,Impaired UE functional use  Visit Diagnosis: Cervicalgia  Muscle weakness (generalized)  Radiculopathy, cervical region     Problem List Patient Active Problem List   Diagnosis Date Noted  . PFO (patent foramen ovale)   . CAD (coronary artery disease), native coronary artery   .  Acute CVA (cerebrovascular accident) ([place]) 06/15/2018  . Depression 10/16/2017  . Migraine 10/16/2017  . Hyperglycemia, unspecified 07/10/2017  . Hyperglycemia 07/10/2017  . H/O dizziness 08/02/2016  . Cervical disc disorder with radiculopathy of cervical region 04/12/2016  . Nonallopathic lesion of  thoracic region 04/12/2016  . Nonallopathic lesion of rib cage 04/12/2016  . Facet arthritis of cervical region 03/21/2016  . Hyperlipidemia 01/26/2015  . Labral tear of shoulder, right, subsequent encounter 12/04/2014  . 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 Impingement syndrome of right shoulder 05/28/2014  . Right supraspinatus tenosynovitis 05/28/2014  . Arthritis pain 04/03/2014  . Fatigue 04/03/2014  . 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 IBS (irritable bowel syndrome) 06/20/2012  . Lumbar disc disease 06/20/2012   Janna Arch, PT, DPT   10/05/2020, 3:29 PM  [place] MAIN [facility] SERVICES [address] [place], [place], [address] Phone: [phone]   Fax:  [phone]  Name: Lori Morales MRN: [id number] Date of Birth: [birth year]

## 2020-10-08 ENCOUNTER — Other Ambulatory Visit: Payer: Self-pay

## 2020-10-08 ENCOUNTER — Ambulatory Visit: Payer: 59 | Attending: Internal Medicine

## 2020-10-08 DIAGNOSIS — M542 Cervicalgia: Secondary | ICD-10-CM | POA: Insufficient documentation

## 2020-10-08 DIAGNOSIS — M5412 Radiculopathy, cervical region: Secondary | ICD-10-CM | POA: Insufficient documentation

## 2020-10-08 DIAGNOSIS — M6281 Muscle weakness (generalized): Secondary | ICD-10-CM | POA: Diagnosis not present

## 2020-10-08 NOTE — Therapy (Signed)
[place] MAIN [facility] SERVICES [address] [place], [place], [address] Phone: [phone]   Fax:  [phone]  Physical Therapy Treatment /Discharge   Patient Details  Name: Lori Morales MRN: [id number] Date of Birth: 18-Apr-1961 Referring Provider (PT): Lily Kocher, [place]   Encounter Date: 10/08/2020   PT End of Session - 10/08/20 1353    Visit Number 16    Number of Visits 25    Date for PT Re-Evaluation 11/06/20    Authorization Type Initial cert: 2/59/5638- 7/56/4332    Authorization Time Period 8/8 approved from workers comp    PT Start Time 1300    PT Stop Time 1344    PT Time Calculation (min) 44 min    Activity Tolerance No increased pain;Patient tolerated treatment well    Behavior During Therapy [facility] for tasks assessed/performed           Past Medical History:  Diagnosis Date  . Arthritis   . CAD (coronary artery disease), native coronary artery    mild non calcified plaque in mid LAD and prox LCx on coronary CTA 01/2019  . Depression   . GERD (gastroesophageal reflux disease)   . Headache    migraines  . Hx of squamous cell carcinoma 07/11/2016   Right distal dorsum lat forearm   . Hypertension   . IBS (irritable bowel syndrome)   . Palpitations   . PFO (patent foramen ovale)    small by cardiac CTA 01/2019  . PONV (postoperative nausea and vomiting)   . Restless legs syndrome   . Squamous cell carcinoma of skin 12/202/2017   right distal dorsum lateral forearm    Past Surgical History:  Procedure Laterality Date  . ABDOMINOPLASTY    . BACK SURGERY     1 laminectomy, 1 fusion  . COLONOSCOPY    . COLONOSCOPY WITH PROPOFOL N/A 01/17/2017   Procedure: COLONOSCOPY WITH PROPOFOL;  Surgeon: Lollie Sails, MD;  Location: [facility] ENDOSCOPY;  Service: Endoscopy;  Laterality: N/A;  . WISDOM TOOTH EXTRACTION      There were no vitals filed for this visit.   Subjective Assessment - 10/08/20 1352    Subjective  Patient reports feeling very sore. Has been compliant with HEP, is aware today is her last approved day by workers comp.    Pertinent History Patient has PMH: significant for C6-7 arthroplasty. PMH significant for arthritis, depression,IBS, Squamous cell carcinoma, Hypertension.    Limitations Lifting;Writing;House hold activities;Other (comment)   Patient reports 10lb lifting restriction.   How long can you sit comfortably? Increased pain if performing deskwork for > 1/2 day.    How long can you stand comfortably? No issues    How long can you walk comfortably? No issues    Diagnostic tests None    Patient Stated Goals To be painfree and work without pain or restrictions as well as sleep better.    Currently in Pain? Yes    Pain Score 1     Pain Location Neck    Pain Orientation Right    Pain Descriptors / Indicators Aching    Pain Type Chronic pain    Pain Onset 1 to 4 weeks ago    Pain Frequency Intermittent                Goals:  FOTO 57 NDI: 32% VAS: 3/10  Cervical ROM: WFL bilat: L 45 R 52    Treatment:    supine: use of heat pad  under upper thoracic lower cervical region Cervical sidebend with gentle overpressure 4x60 second holds each direction  cervical rotation with gentle overpressure 4x 60 second holds each direction Suboccipital release 3x30 second holds   Prone:  STM to upper trap, levator scap, cervical paraspinals x11 minutes with implementation of effleurage and ptrissage for muscle tissue tension reduction.       Pt educated throughout session about proper posture and technique with exercises. Improved exercise technique, movement at target joints, use of target muscles after min to mod verbal, visual, tactile cues.     Trigger Point Dry Needling (TDN), unbilled Education performed with patient regarding potential benefit of TDN. Reviewed precautions and risks with patient. Reviewed special precautions/risks over lung fields which include pneumothorax.  Reviewed signs and symptoms of pneumothorax and advised pt to go to ER immediately if these symptoms develop advise them of dry needling treatment. Extensive time spent with pt to ensure full understanding of TDN risks. Pt provided verbal consent to treatment. TDN performed to  with 0.25 x .40 single needle placements with local twitch response (LTR) to L and R cervical paraspinal,upper trap, and L infraspinatus. Pistoning technique utilized. Improved pain-free motion following intervention. x5 minutes   Patient has reached the end of her approved visits from workers comp. She is self sufficient with her HEP and has improved pain levels at rest. She does still have some pain and tension and I will be happy to see hear again in the future as needed.                     PT Education - 10/08/20 1312    Education Details discharge    Person(s) Educated Patient    Methods Explanation;Demonstration;Tactile cues;Verbal cues    Comprehension Verbalized understanding;Returned demonstration;Verbal cues required;Tactile cues required            PT Short Term Goals - 10/08/20 1355      PT SHORT TERM GOAL #1   Title Pt will be independent with HEP in order to improve strength and decrease back pain in order to improve pain-free function at home and work.    Baseline 08/13/2020- will need new HEP with new Dx- Has previous HEP for recent Cervical issues- will modify as appropriate. 4/11: HEP compliant 5/12; HEP compliant    Time 6    Period Weeks    Status Achieved    Target Date 09/25/20             PT Long Term Goals - 10/08/20 1310      PT LONG TERM GOAL #1   Title Patient will increase FOTO score to equal to or greater than  66/100   to demonstrate statistically significant improvement in mobility and quality of life.    Baseline 08/14/2020: FOTO score=55 4/11: 54% 5/12: 57%    Time 12    Period Weeks    Status Partially Met      PT LONG TERM GOAL #2   Title Pt will  demonstrate decrease in NDI by at least 19% in order to demonstrate clinically significant reduction in disability related to neck injury/pain    Baseline 08/14/2020: NDI= 54% 4/11: 30% 5/12: 32%    Time 12    Period Weeks    Status Partially Met      PT LONG TERM GOAL #3   Title Pt will decrease worst neck pain as reported on NPRS by at least 2 points in order to demonstrate clinically  significant reduction in back pain.    Baseline 08/14/2020: Patient rates Left sided Neck/UT pain at 4/10 with movement. 4/11: 3/10 at worst 5/12: 3/10 at worst    Time 12    Period Weeks    Status Achieved      PT LONG TERM GOAL #4   Title Patient will demonstrate improved Cervical ROM by 50% in order to improve her functional ability to turn her head or nod for return of functional capabilities including driving, working, and sleeping.    Baseline 08/14/2020= AROM cervical flex= 20deg, ext=25, LF= 10 deg Left/20 deg Right, Rotation= 42 deg L/50 deg R 4/11; see note 5/12: R 52, L 45    Time 12    Period Weeks    Status Partially Met                 Plan - 10/08/20 1355    Clinical Impression Statement Patient has reached the end of her approved visits from workers comp. She is self sufficient with her HEP and has improved pain levels at rest. She does still have some pain and tension and I will be happy to see hear again in the future as needed.    Personal Factors and Comorbidities Comorbidity 1;Comorbidity 2;Past/Current Experience;Profession    Comorbidities HTN, Previous neck surgery    Examination-Activity Limitations Bathing;Bed Mobility;Caring for Others;Carry;Dressing;Lift;Reach Overhead;Sleep    Examination-Participation Restrictions Cleaning;Community Activity;Driving;Laundry;Occupation;Yard Work    Stability/Clinical Decision Making Stable/Uncomplicated    Rehab Potential Good    PT Frequency 2x / week    PT Duration 12 weeks    PT Treatment/Interventions ADLs/Self Care Home  Management;Cryotherapy;Electrical Stimulation;Moist Heat;Functional mobility training;Therapeutic activities;Therapeutic exercise;Patient/family education;Manual techniques;Passive range of motion;Dry needling;Taping;Spinal Manipulations;Joint Manipulations    PT Next Visit Plan Continue with manual techniques and therapeutic exercises for improved ROM/UE/postural strengthening    PT Home Exercise Plan Reviewed previously instructed Cervical ROM.    Consulted and Agree with Plan of Care Patient           Patient will benefit from skilled therapeutic intervention in order to improve the following deficits and impairments:  Decreased activity tolerance,Decreased endurance,Decreased mobility,Decreased range of motion,Decreased strength,Hypomobility,Impaired UE functional use  Visit Diagnosis: Cervicalgia  Muscle weakness (generalized)  Radiculopathy, cervical region     Problem List Patient Active Problem List   Diagnosis Date Noted  . PFO (patent foramen ovale)   . CAD (coronary artery disease), native coronary artery   . Acute CVA (cerebrovascular accident) ([place]) 06/15/2018  . Depression 10/16/2017  . Migraine 10/16/2017  . Hyperglycemia, unspecified 07/10/2017  . Hyperglycemia 07/10/2017  . H/O dizziness 08/02/2016  . Cervical disc disorder with radiculopathy of cervical region 04/12/2016  . Nonallopathic lesion of thoracic region 04/12/2016  . Nonallopathic lesion of rib cage 04/12/2016  . Facet arthritis of cervical region 03/21/2016  . Hyperlipidemia 01/26/2015  . Labral tear of shoulder, right, subsequent encounter 12/04/2014  . 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 Impingement syndrome of right shoulder 05/28/2014  . Right supraspinatus tenosynovitis 05/28/2014  . Arthritis pain 04/03/2014  . Fatigue 04/03/2014  . 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 IBS (irritable bowel syndrome) 06/20/2012  . Lumbar disc disease 06/20/2012   Janna Arch, PT,  DPT   10/08/2020, 1:58 PM  [place] [facility] MAIN [facility] SERVICES [address] [place], [place], [address] Phone: [phone]   Fax:  [phone]  Name: Lori Morales MRN: [id number] Date of Birth: 13-Oct-1960

## 2020-10-12 ENCOUNTER — Other Ambulatory Visit: Payer: Self-pay

## 2020-10-15 ENCOUNTER — Other Ambulatory Visit: Payer: Self-pay

## 2020-10-15 MED ORDER — LOSARTAN POTASSIUM 25 MG PO TABS
25.0000 mg | ORAL_TABLET | Freq: Every day | ORAL | 1 refills | Status: DC
  Filled 2020-10-15: qty 90, 90d supply, fill #0
  Filled 2021-01-10: qty 90, 90d supply, fill #1

## 2020-10-16 ENCOUNTER — Other Ambulatory Visit: Payer: Self-pay

## 2020-10-27 ENCOUNTER — Other Ambulatory Visit: Payer: Self-pay

## 2020-10-27 ENCOUNTER — Telehealth: Payer: Self-pay

## 2020-10-27 MED ORDER — PREDNISONE 10 MG PO TABS
ORAL_TABLET | ORAL | 0 refills | Status: DC
  Filled 2020-10-27: qty 20, 8d supply, fill #0

## 2020-10-27 NOTE — Telephone Encounter (Signed)
Called [facility] at [phone] to follow up on patient's referral for possible digital mucous cyst at right 2nd finger distal interphalangeal joint and spoke with [facility]. She advises they never received referral for patient and that if patient still needs an appointment she could call them directly. Called patient and she thinks spot could possibly be a wart and said she is going to watch it for now and will call [facility] directly if appt needed, JS

## 2020-10-31 MED FILL — Pantoprazole Sodium EC Tab 40 MG (Base Equiv): ORAL | 90 days supply | Qty: 90 | Fill #0 | Status: AC

## 2020-10-31 MED FILL — Pravastatin Sodium Tab 40 MG: ORAL | 90 days supply | Qty: 90 | Fill #0 | Status: CN

## 2020-10-31 MED FILL — Zolpidem Tartrate Tab ER 6.25 MG: ORAL | 30 days supply | Qty: 30 | Fill #1 | Status: AC

## 2020-11-02 ENCOUNTER — Other Ambulatory Visit: Payer: Self-pay

## 2020-11-03 ENCOUNTER — Other Ambulatory Visit: Payer: Self-pay

## 2020-11-22 ENCOUNTER — Other Ambulatory Visit: Payer: Self-pay

## 2020-11-22 MED FILL — Pravastatin Sodium Tab 40 MG: ORAL | 90 days supply | Qty: 90 | Fill #0 | Status: AC

## 2020-11-23 ENCOUNTER — Other Ambulatory Visit: Payer: Self-pay

## 2020-11-23 MED ORDER — CELECOXIB 200 MG PO CAPS
ORAL_CAPSULE | ORAL | 3 refills | Status: AC
  Filled 2020-11-23: qty 180, 90d supply, fill #0
  Filled 2021-04-20: qty 180, 90d supply, fill #1

## 2020-11-24 ENCOUNTER — Other Ambulatory Visit: Payer: Self-pay

## 2020-11-26 ENCOUNTER — Other Ambulatory Visit: Payer: Self-pay

## 2020-11-26 MED ORDER — HYDROCODONE-ACETAMINOPHEN 5-325 MG PO TABS
ORAL_TABLET | ORAL | 0 refills | Status: DC
  Filled 2020-11-26: qty 30, 3d supply, fill #0

## 2020-11-26 MED ORDER — DIAZEPAM 10 MG PO TABS
ORAL_TABLET | ORAL | 0 refills | Status: DC
  Filled 2020-11-26: qty 2, 1d supply, fill #0

## 2020-11-26 MED ORDER — ONDANSETRON HCL 8 MG PO TABS
8.0000 mg | ORAL_TABLET | ORAL | 0 refills | Status: DC
  Filled 2020-11-26: qty 15, 5d supply, fill #0

## 2020-11-26 MED ORDER — CEFADROXIL 500 MG PO CAPS
ORAL_CAPSULE | ORAL | 0 refills | Status: DC
  Filled 2020-11-26: qty 6, 3d supply, fill #0

## 2020-11-27 ENCOUNTER — Other Ambulatory Visit (HOSPITAL_COMMUNITY): Payer: Self-pay

## 2020-12-07 MED FILL — Zolpidem Tartrate Tab ER 6.25 MG: ORAL | 30 days supply | Qty: 30 | Fill #2 | Status: AC

## 2020-12-08 ENCOUNTER — Ambulatory Visit: Payer: 59 | Attending: Internal Medicine

## 2020-12-08 ENCOUNTER — Other Ambulatory Visit: Payer: Self-pay

## 2020-12-08 DIAGNOSIS — M25522 Pain in left elbow: Secondary | ICD-10-CM | POA: Insufficient documentation

## 2020-12-08 NOTE — Therapy (Signed)
[place] MAIN [facility] SERVICES [address] [place], [place], [address] Phone: [phone]   Fax:  [phone]  Patient Details  Name: Lori Morales MRN: [id number] Date of Birth: [birth year] Referring Provider:  Adin Hector, MD  Encounter Date: 12/08/2020  PT/OT/SLP Screening Form   Time: in 13: 02    Time out 13: 33   Complaint _L elbow pain  Past Medical Hx:  history of L cervical radiculopathy and disc replacement Injury Date:_6-8 months ago began, a few weeks has worsened.  Pain Scale:8/10 at worst 6/10 current Patient's phone number:   Hx (this occurrence):   Patient has had intermittent L elbow pain that has worsened in past weeks. Pain began ~6-8 months ago but has progressed in the past two weeks. She reports she has been attempting to stretch, ice, and brace it without success.    Assessment:  L wrist flexors and extensors limited by extensive muscle tightness.  Pain to palpation of lateral epicondyle region and insertion of ECRB.  Trigger Point Dry Needling (TDN), unbilled Education performed with patient regarding potential benefit of TDN. Reviewed precautions and risks with patient. Reviewed special precautions/risks over lung fields which include pneumothorax. Reviewed signs and symptoms of pneumothorax and advised pt to go to ER immediately if these symptoms develop advise them of dry needling treatment. Extensive time spent with pt to ensure full understanding of TDN risks. Pt provided verbal consent to treatment. TDN performed to  with 0.25 x 40 single needle placements with local twitch response (LTR). Pistoning technique utilized. Improved pain-free motion following intervention. Muscle targeted: L wrist extensors and flexors, L upper trap x 18 minutes     Recommendations:    Comments: Patient presents with symptoms indicating lateral epicondylitis. Her ECRB insertion has point tenderness and release of  musculature immediately decreases pain. Patient tolerated TDN with report of symptoms. Educated on stretching and self STM. If pain persists I would like to see this patient again in the future.     []  Patient would benefit from an MD referral []  Patient would benefit from a full PT/OT/ SLP evaluation and treatment. [x]  No intervention recommended at this time.   Janna Arch, PT, DPT  12/08/2020, 1:46 PM  [place] MAIN [facility] SERVICES [address] [place], [place], [address] Phone: [phone]   Fax:  [phone]

## 2020-12-10 ENCOUNTER — Other Ambulatory Visit: Payer: Self-pay

## 2020-12-10 DIAGNOSIS — N94819 Vulvodynia, unspecified: Secondary | ICD-10-CM | POA: Diagnosis not present

## 2020-12-10 DIAGNOSIS — Z124 Encounter for screening for malignant neoplasm of cervix: Secondary | ICD-10-CM | POA: Diagnosis not present

## 2020-12-10 DIAGNOSIS — N951 Menopausal and female climacteric states: Secondary | ICD-10-CM | POA: Diagnosis not present

## 2020-12-10 DIAGNOSIS — N952 Postmenopausal atrophic vaginitis: Secondary | ICD-10-CM | POA: Diagnosis not present

## 2020-12-10 DIAGNOSIS — Z1231 Encounter for screening mammogram for malignant neoplasm of breast: Secondary | ICD-10-CM | POA: Diagnosis not present

## 2020-12-10 DIAGNOSIS — Z01419 Encounter for gynecological examination (general) (routine) without abnormal findings: Secondary | ICD-10-CM | POA: Diagnosis not present

## 2020-12-10 MED ORDER — ESTRADIOL 10 MCG VA TABS
ORAL_TABLET | VAGINAL | 4 refills | Status: DC
  Filled 2020-12-10: qty 24, 84d supply, fill #0
  Filled 2021-03-07: qty 24, 84d supply, fill #1
  Filled 2021-06-28: qty 24, 84d supply, fill #2
  Filled 2021-12-09: qty 24, 84d supply, fill #3

## 2020-12-10 MED ORDER — ESTRADIOL-NORETHINDRONE ACET 0.5-0.1 MG PO TABS
1.0000 | ORAL_TABLET | Freq: Every day | ORAL | 11 refills | Status: DC
  Filled 2020-12-10: qty 28, 28d supply, fill #0

## 2020-12-10 MED ORDER — AMITRIPTYLINE HCL 10 MG PO TABS
ORAL_TABLET | ORAL | 4 refills | Status: DC
  Filled 2020-12-10: qty 90, 90d supply, fill #0
  Filled 2021-03-07: qty 90, 90d supply, fill #1
  Filled 2021-06-26: qty 90, 90d supply, fill #2
  Filled 2021-09-28: qty 90, 90d supply, fill #3

## 2020-12-11 ENCOUNTER — Other Ambulatory Visit: Payer: Self-pay

## 2020-12-15 ENCOUNTER — Other Ambulatory Visit: Payer: Self-pay

## 2020-12-15 MED ORDER — ALPRAZOLAM 0.5 MG PO TABS
0.5000 mg | ORAL_TABLET | ORAL | 0 refills | Status: DC
  Filled 2020-12-15: qty 2, 1d supply, fill #0

## 2021-01-07 DIAGNOSIS — M67441 Ganglion, right hand: Secondary | ICD-10-CM | POA: Diagnosis not present

## 2021-01-10 ENCOUNTER — Other Ambulatory Visit: Payer: Self-pay

## 2021-01-11 ENCOUNTER — Other Ambulatory Visit: Payer: Self-pay

## 2021-01-11 MED ORDER — ZOLPIDEM TARTRATE ER 6.25 MG PO TBCR
6.2500 mg | EXTENDED_RELEASE_TABLET | Freq: Every evening | ORAL | 5 refills | Status: AC | PRN
  Filled 2021-01-11: qty 30, 30d supply, fill #0

## 2021-01-12 ENCOUNTER — Other Ambulatory Visit: Payer: Self-pay

## 2021-01-14 ENCOUNTER — Other Ambulatory Visit: Payer: Self-pay

## 2021-01-18 ENCOUNTER — Other Ambulatory Visit: Payer: Self-pay

## 2021-01-19 ENCOUNTER — Other Ambulatory Visit: Payer: Self-pay

## 2021-01-20 ENCOUNTER — Other Ambulatory Visit: Payer: Self-pay

## 2021-01-24 MED FILL — Pramipexole Dihydrochloride Tab 0.25 MG: ORAL | 45 days supply | Qty: 90 | Fill #1 | Status: AC

## 2021-01-25 ENCOUNTER — Other Ambulatory Visit: Payer: Self-pay

## 2021-01-28 ENCOUNTER — Other Ambulatory Visit: Payer: Self-pay

## 2021-01-28 DIAGNOSIS — M67441 Ganglion, right hand: Secondary | ICD-10-CM | POA: Diagnosis not present

## 2021-01-28 MED ORDER — TRAMADOL HCL 50 MG PO TABS
ORAL_TABLET | ORAL | 0 refills | Status: DC
  Filled 2021-01-28: qty 30, 7d supply, fill #0

## 2021-01-28 MED ORDER — CEPHALEXIN 500 MG PO CAPS
ORAL_CAPSULE | ORAL | 0 refills | Status: DC
  Filled 2021-01-28: qty 20, 5d supply, fill #0

## 2021-01-29 DIAGNOSIS — H5213 Myopia, bilateral: Secondary | ICD-10-CM | POA: Diagnosis not present

## 2021-02-17 ENCOUNTER — Other Ambulatory Visit: Payer: Self-pay

## 2021-02-17 DIAGNOSIS — R739 Hyperglycemia, unspecified: Secondary | ICD-10-CM | POA: Diagnosis not present

## 2021-02-17 DIAGNOSIS — I2510 Atherosclerotic heart disease of native coronary artery without angina pectoris: Secondary | ICD-10-CM | POA: Diagnosis not present

## 2021-02-17 DIAGNOSIS — E7849 Other hyperlipidemia: Secondary | ICD-10-CM | POA: Diagnosis not present

## 2021-02-17 DIAGNOSIS — M519 Unspecified thoracic, thoracolumbar and lumbosacral intervertebral disc disorder: Secondary | ICD-10-CM | POA: Diagnosis not present

## 2021-02-17 DIAGNOSIS — I10 Essential (primary) hypertension: Secondary | ICD-10-CM | POA: Diagnosis not present

## 2021-02-17 MED ORDER — PANTOPRAZOLE SODIUM 40 MG PO TBEC
DELAYED_RELEASE_TABLET | ORAL | 1 refills | Status: DC
  Filled 2021-02-17: qty 90, 90d supply, fill #0
  Filled 2021-05-18: qty 90, 90d supply, fill #1

## 2021-02-17 MED ORDER — PRAVASTATIN SODIUM 40 MG PO TABS
ORAL_TABLET | ORAL | 1 refills | Status: DC
  Filled 2021-02-17: qty 90, 90d supply, fill #0
  Filled 2021-05-18: qty 90, 90d supply, fill #1

## 2021-02-17 MED ORDER — ZOLPIDEM TARTRATE ER 6.25 MG PO TBCR
EXTENDED_RELEASE_TABLET | ORAL | 5 refills | Status: DC
  Filled 2021-02-17: qty 30, 30d supply, fill #0
  Filled 2021-03-28: qty 30, 30d supply, fill #1
  Filled 2021-05-18: qty 30, 30d supply, fill #2
  Filled 2021-08-01: qty 30, 30d supply, fill #3

## 2021-02-25 ENCOUNTER — Other Ambulatory Visit: Payer: Self-pay

## 2021-02-25 DIAGNOSIS — I10 Essential (primary) hypertension: Secondary | ICD-10-CM | POA: Diagnosis not present

## 2021-02-25 DIAGNOSIS — I2510 Atherosclerotic heart disease of native coronary artery without angina pectoris: Secondary | ICD-10-CM | POA: Diagnosis not present

## 2021-02-25 DIAGNOSIS — R739 Hyperglycemia, unspecified: Secondary | ICD-10-CM | POA: Diagnosis not present

## 2021-02-25 DIAGNOSIS — E7849 Other hyperlipidemia: Secondary | ICD-10-CM | POA: Diagnosis not present

## 2021-02-25 MED ORDER — PREDNISONE 10 MG PO TABS
ORAL_TABLET | ORAL | 0 refills | Status: DC
  Filled 2021-02-25: qty 20, 8d supply, fill #0

## 2021-02-25 MED ORDER — HYDROCODONE-ACETAMINOPHEN 5-325 MG PO TABS
ORAL_TABLET | ORAL | 0 refills | Status: DC
  Filled 2021-02-25: qty 15, 5d supply, fill #0

## 2021-03-04 ENCOUNTER — Ambulatory Visit: Payer: 59 | Attending: Internal Medicine

## 2021-03-04 ENCOUNTER — Other Ambulatory Visit: Payer: Self-pay

## 2021-03-04 DIAGNOSIS — R293 Abnormal posture: Secondary | ICD-10-CM | POA: Diagnosis not present

## 2021-03-04 DIAGNOSIS — M6281 Muscle weakness (generalized): Secondary | ICD-10-CM | POA: Diagnosis not present

## 2021-03-04 DIAGNOSIS — M542 Cervicalgia: Secondary | ICD-10-CM | POA: Insufficient documentation

## 2021-03-04 NOTE — Therapy (Signed)
[place] MAIN [facility] SERVICES [address] [place], [place], [address] Phone: [phone]   Fax:  [phone]  Physical Therapy Evaluation  Patient Details  Name: Lori Morales MRN: [id number] Date of Birth: 14-Nov-1960 Referring Provider (PT): Tama High III MD   Encounter Date: 03/04/2021   PT End of Session - 03/04/21 1713     Visit Number 1    Number of Visits 16    Date for PT Re-Evaluation 04/29/21    Authorization Type 1/10 eval 10/6    PT Start Time 0720    PT Stop Time 0800    PT Time Calculation (min) 40 min    Activity Tolerance Patient tolerated treatment well    Behavior During Therapy [facility] for tasks assessed/performed             Past Medical History:  Diagnosis Date   Arthritis    CAD (coronary artery disease), native coronary artery    mild non calcified plaque in mid LAD and prox LCx on coronary CTA 01/2019   Depression    GERD (gastroesophageal reflux disease)    Headache    migraines   Hx of squamous cell carcinoma 07/11/2016   Right distal dorsum lat forearm    Hypertension    IBS (irritable bowel syndrome)    Palpitations    PFO (patent foramen ovale)    small by cardiac CTA 01/2019   PONV (postoperative nausea and vomiting)    Restless legs syndrome    Squamous cell carcinoma of skin 12/202/2017   right distal dorsum lateral forearm    Past Surgical History:  Procedure Laterality Date   ABDOMINOPLASTY     BACK SURGERY     1 laminectomy, 1 fusion   COLONOSCOPY     COLONOSCOPY WITH PROPOFOL N/A 01/17/2017   Procedure: COLONOSCOPY WITH PROPOFOL;  Surgeon: Lollie Sails, MD;  Location: [facility] ENDOSCOPY;  Service: Endoscopy;  Laterality: N/A;   WISDOM TOOTH EXTRACTION      There were no vitals filed for this visit.    Subjective Assessment - 03/04/21 1708     Subjective Patient is returning back to physical therapy for her neck.    Pertinent History Patient is returning back to physical  therapy for her neck. Patient has a history of C6-7 arthroplasty and has been treated by this clinician before. PMH includes anxiety, CAD, depression, arthritis of cervical region, gastritis, GERD, cancer, stroke, HLD, HTN, IB, lumbar disc disease (fusion x2 ), migraine, numbness of L hand, headaches, and restless leg syndrome. R sided neck pain with L sided tightness, but no radiating to the arm.    Limitations Lifting;Writing;House hold activities;Other (comment);Reading;Sitting    How long can you sit comfortably? Increased pain if performing deskwork for > 1/2 day.    How long can you stand comfortably? n/a    How long can you walk comfortably? n/a    Diagnostic tests MRI prior to surgery    Patient Stated Goals To be painfree and work without pain or restrictions as well as sleep better.    Currently in Pain? Yes    Pain Score 2     Pain Location Neck    Pain Orientation Right;Left    Pain Descriptors / Indicators Aching    Pain Type Chronic pain    Pain Onset More than a month ago    Pain Frequency Intermittent    Aggravating Factors  working, crochet, reading    Pain Relieving  Factors heat and stretching    Effect of Pain on Daily Activities affects work and leisure activities             [facility] PT Assessment - 03/04/21 0001       Assessment   Medical Diagnosis cervical disc disorder    Referring Provider (PT) Tama High III MD    Onset Date/Surgical Date 07/27/20    Hand Dominance Right    Prior Therapy yes      Precautions   Precautions None      Restrictions   Weight Bearing Restrictions No      Balance Screen   Has the patient fallen in the past 6 months No    Has the patient had a decrease in activity level because of a fear of falling?  Yes    Is the patient reluctant to leave their home because of a fear of falling?  No      Home Ecologist residence    Living Arrangements Spouse/significant other    Type of [place] to enter      Prior Function   Level of Independence Independent    Vocation Full time employment    Vocation Requirements computer work, working with patient care, meetings    Leisure crochet, quilt, read, play with granddaughter      Cognition   Overall Cognitive Status Within Functional Limits for tasks assessed      Observation/Other Assessments   Focus on Therapeutic Outcomes (FOTO)  48%             SUBJECTIVE  Chief complaint:  cervical pain  Referring Dx: cervical disorder  MD: Tama High III Pain: 2/10 Present, 1/10 Best, 4/10 Worst: Aggravating factors:the more she is on a computer, crochet, reading working Easing factors: heat and stretching 24 hour pain behavior: stiff in the morning, stay stiff then reduce when not doing computer work.  Recent neck trauma: No Prior history of neck injury or pain: Yes Pain quality: pain quality: cramping Radiating pain: No  Numbness/Tingling: No Follow-up appointment with MD: No Dominant hand: right Imaging: Yes    OBJECTIVE  Mental Status Patient is oriented to person, place and time.  Recent memory is intact.  Remote memory is intact.  Attention span and concentration are intact.  Expressive speech is intact.  Patient's fund of knowledge is within normal limits for educational level.  SENSATION: Grossly intact to light touch bilateral UE as determined by testing dermatomes C2-T2 Proprioception and hot/cold testing deferred on this date   MUSCULOSKELETAL: Tremor: None Bulk: Normal Tone: Normal  Posture  elevated shoulders and forward head rounded shoulders  Palpation High tension of bilateral upper traps, palpable muscle guarding  Strength R/L 4+*/4+ *Shoulder flexion (anterior deltoid/pec major/coracobrachialis, axillary n. (C5/6) and musculocutaneous n. (C5-7)) 4/*4* Shoulder abduction (deltoid/supraspinatus, axillary/suprascapular n, C5) 4/4 Shoulder extension (posterior deltoid,  lats, teres major, axillary/thoracodorsal n.) 4*/4* Shoulder horizontal abduction 4+/4+ Elbow flexion (biceps brachii, brachialis, brachioradialis, musculoskeletal n, C5/6) 5/5 Elbow extension (triceps, radial n, C7) 5/5 Wrist Extension (C6/7) 5/5 Wrist Flexion (C6/7) 5/5 Finger adduction (interossei, ulnar n, T1)  *pain    AROM seated R/L 32 Cervical Flexion 32 Cervical Extension 22/32 Cervical Lateral Flexion 85/85 Cervical Rotation *Indicates pain, overpressure performed unless otherwise indicated  Repeated Movements No changes with repeated flexion/extension: increased pain bilateral directions   Muscle Length Upper Trap:limited bilaterally     Passive Accessory  Intervertebral Motion (PAIVM) Pt denies reproduction of neck pain with UPA bilaterally C2-T7. Painful to CPA;s Generally hypomobile throughout  Passive Physiological Intervertebral Motion (PPIVM) Normal flexion and extension with PPIVM testing   SPECIAL TESTS Spurlings A (ipsilateral lateral flexion/axial compression): R: Positive L: Positive  Distraction Test: Positive   FOTO:48%; predicted discharge 61% NDI: given will return next session   ASSESSMENT Clinical Impression: Pt is a pleasant 60 year-old female referred for neck pain. Patient returning to this clinician as she has been treated by this clinician in the past for the same problem. PT examination reveals deficits in cervical ROM, UE strength, and muscle guarding. Patient desires to be pain free for improved work/leisure pursuits as well as upcoming trip. Pt presents with deficits in strength, mobility, range of motion, and pain. Pt will benefit from skilled PT services to address deficits and return to pain-free function at home and work.   Low (stable): no personal factors/comorbidities, 1-2 body systems/activity limitations/participation restrictions   Moderate (evolving): 1-2 personal factors/comorbidities, 3 or more body systems/activity  limitations/participation restrictions   High (unstable): 3 or more personal factors/comorbidities, 4 or more body systems/activity limitations/participation restrictions   PLAN Next Visit: HEP:   Pt will be independent with HEP in order to improve strength and decrease back pain in order to improve pain-free function at home and work.     Pt will demonstrate decrease in NDI by at least 19% in order to demonstrate clinically significant reduction in disability related to neck injury/pain   Pt will decrease worst neck pain as reported on NPRS by at least 2 points in order to demonstrate clinically significant reduction in back pain.   Pt will increase strength of by at least 1/2 MMT grade in order to demonstrate improvement in strength and function.       Treatment:   Trigger Point Dry Needling (TDN), unbilled Education performed with patient regarding potential benefit of TDN. Reviewed precautions and risks with patient. Reviewed special precautions/risks over lung fields which include pneumothorax. Reviewed signs and symptoms of pneumothorax and advised pt to go to ER immediately if these symptoms develop advise them of dry needling treatment. Extensive time spent with pt to ensure full understanding of TDN risks. Pt provided verbal consent to treatment. TDN performed to  with 0.25 x 40 single needle placements with local twitch response (LTR). Pistoning technique utilized. Improved pain-free motion following intervention. Interventions applied to bilateral upper traps x6 minutes             Objective measurements completed on examination: See above findings.                PT Education - 03/04/21 1713     Education Details goals, POC,    Person(s) Educated Patient    Methods Explanation    Comprehension Verbalized understanding              PT Short Term Goals - 03/04/21 1716       PT SHORT TERM GOAL #1   Title Pt will be independent with HEP in  order to improve strength and decrease neck pain in order to improve pain-free function at home and work.    Baseline 10/6: will provide next session    Time 4    Period Weeks    Status New    Target Date 04/01/21               PT Long Term Goals - 03/04/21 1717  PT LONG TERM GOAL #1   Title Patient will increase FOTO score to equal to or greater than  61/100   to demonstrate statistically significant improvement in mobility and quality of life.    Baseline 10/6: 48%    Time 8    Period Weeks    Status New    Target Date 04/29/21      PT LONG TERM GOAL #2   Title Pt will demonstrate decrease in NDI by at least 19% in order to demonstrate clinically significant reduction in disability related to neck injury/pain    Baseline 10/6: will perform next session    Time 8    Period Weeks    Status New    Target Date 04/29/21      PT LONG TERM GOAL #3   Title Pt will decrease worst neck pain as reported on NPRS by at least 2 points (2/10) in order to demonstrate clinically significant reduction in neck pain.    Baseline 10/6: 4/10    Time 8    Period Weeks    Status New    Target Date 04/29/21      PT LONG TERM GOAL #4   Title Patient will demonstrate improved Cervical ROM by 50% in order to improve her functional ability to turn her head or nod for return of functional capabilities including driving, working, and sleeping.    Baseline 10/6: see note    Time 8    Period Weeks    Status New    Target Date 04/29/21                    Plan - 03/04/21 1714     Clinical Impression Statement Pt is a pleasant 60 year-old female referred for neck pain. Patient returning to this clinician as she has been treated by this clinician in the past for the same problem. PT examination reveals deficits in cervical ROM, UE strength, and muscle guarding. Patient desires to be pain free for improved work/leisure pursuits as well as upcoming trip. Pt presents with deficits in  strength, mobility, range of motion, and pain. Pt will benefit from skilled PT services to address deficits and return to pain-free function at home and work.    Personal Factors and Comorbidities Age;Comorbidity 3+;Past/Current Experience;Profession;Time since onset of injury/illness/exacerbation    Comorbidities anxiety, CAD, depression, arthritis of cervical region, gastritis, GERD, cancer, stroke, HLD, HTN, IB, lumbar disc disease (fusion x2 ), migraine, numbness of L hand, headaches, and restless leg syndrome    Examination-Activity Limitations Bathing;Bed Mobility;Caring for Others;Carry;Dressing;Lift;Reach Overhead;Sleep    Examination-Participation Restrictions Cleaning;Community Activity;Driving;Laundry;Occupation;Yard Work;Meal Prep    Stability/Clinical Decision Making Evolving/Moderate complexity    Clinical Decision Making Moderate    Rehab Potential Good    PT Frequency 2x / week    PT Duration 8 weeks    PT Treatment/Interventions ADLs/Self Care Home Management;Cryotherapy;Electrical Stimulation;Moist Heat;Functional mobility training;Therapeutic activities;Therapeutic exercise;Patient/family education;Manual techniques;Passive range of motion;Dry needling;Taping;Spinal Manipulations;Joint Manipulations;Traction;Ultrasound;Gait training;Stair training;Energy conservation;Visual/perceptual remediation/compensation    PT Next Visit Plan manual and therex for cervical ROM pain reduction    PT Home Exercise Plan give next session    Consulted and Agree with Plan of Care Patient             Patient will benefit from skilled therapeutic intervention in order to improve the following deficits and impairments:  Decreased activity tolerance, Decreased endurance, Decreased mobility, Decreased range of motion, Decreased strength, Hypomobility, Impaired flexibility, Increased muscle spasms, Impaired  UE functional use, Postural dysfunction, Improper body mechanics, Pain  Visit  Diagnosis: Cervicalgia  Abnormal posture     Problem List Patient Active Problem List   Diagnosis Date Noted   PFO (patent foramen ovale)    CAD (coronary artery disease), native coronary artery    Acute CVA (cerebrovascular accident) (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 [place], [place], [address] Phone: [phone]   Fax:  [phone]  Name: Lori Morales MRN: [id number] Date of Birth: [birth year]

## 2021-03-07 ENCOUNTER — Other Ambulatory Visit: Payer: Self-pay

## 2021-03-08 ENCOUNTER — Other Ambulatory Visit: Payer: Self-pay

## 2021-03-08 MED ORDER — PRAMIPEXOLE DIHYDROCHLORIDE 0.25 MG PO TABS
ORAL_TABLET | ORAL | 5 refills | Status: DC
  Filled 2021-03-08: qty 90, 30d supply, fill #0
  Filled 2021-04-20: qty 90, 30d supply, fill #1
  Filled 2021-06-15: qty 90, 30d supply, fill #2
  Filled 2021-11-13: qty 90, 30d supply, fill #3
  Filled 2022-01-05: qty 90, 30d supply, fill #4
  Filled 2022-03-06: qty 90, 30d supply, fill #5

## 2021-03-09 ENCOUNTER — Other Ambulatory Visit: Payer: Self-pay

## 2021-03-09 ENCOUNTER — Ambulatory Visit: Payer: 59

## 2021-03-09 DIAGNOSIS — M6281 Muscle weakness (generalized): Secondary | ICD-10-CM

## 2021-03-09 DIAGNOSIS — M542 Cervicalgia: Secondary | ICD-10-CM | POA: Diagnosis not present

## 2021-03-09 DIAGNOSIS — R293 Abnormal posture: Secondary | ICD-10-CM | POA: Diagnosis not present

## 2021-03-09 NOTE — Therapy (Signed)
[place] MAIN [facility] SERVICES [address] [place], [place], [address] Phone: [phone]   Fax:  [phone]  Physical Therapy Treatment  Patient Details  Name: Lori Morales MRN: [id number] Date of Birth: 1960-12-08 Referring Provider (PT): Tama High III MD   Encounter Date: 03/09/2021   PT End of Session - 03/09/21 0729     Visit Number 2    Number of Visits 16    Date for PT Re-Evaluation 04/29/21    Authorization Type 2/10 eval 10/6    PT Start Time 0716    PT Stop Time 0800    PT Time Calculation (min) 44 min    Activity Tolerance Patient tolerated treatment well    Behavior During Therapy [facility] for tasks assessed/performed             Past Medical History:  Diagnosis Date   Arthritis    CAD (coronary artery disease), native coronary artery    mild non calcified plaque in mid LAD and prox LCx on coronary CTA 01/2019   Depression    GERD (gastroesophageal reflux disease)    Headache    migraines   Hx of squamous cell carcinoma 07/11/2016   Right distal dorsum lat forearm    Hypertension    IBS (irritable bowel syndrome)    Palpitations    PFO (patent foramen ovale)    small by cardiac CTA 01/2019   PONV (postoperative nausea and vomiting)    Restless legs syndrome    Squamous cell carcinoma of skin 12/202/2017   right distal dorsum lateral forearm    Past Surgical History:  Procedure Laterality Date   ABDOMINOPLASTY     BACK SURGERY     1 laminectomy, 1 fusion   COLONOSCOPY     COLONOSCOPY WITH PROPOFOL N/A 01/17/2017   Procedure: COLONOSCOPY WITH PROPOFOL;  Surgeon: Lollie Sails, MD;  Location: [facility] ENDOSCOPY;  Service: Endoscopy;  Laterality: N/A;   WISDOM TOOTH EXTRACTION      There were no vitals filed for this visit.   Subjective Assessment - 03/09/21 0728     Subjective Patient reports Friday night was a bad night with increased spasm in neck that went into Saturday. No falls or LOB since  last session.    Pertinent History Patient is returning back to physical therapy for her neck. Patient has a history of C6-7 arthroplasty and has been treated by this clinician before. PMH includes anxiety, CAD, depression, arthritis of cervical region, gastritis, GERD, cancer, stroke, HLD, HTN, IB, lumbar disc disease (fusion x2 ), migraine, numbness of L hand, headaches, and restless leg syndrome. R sided neck pain with L sided tightness, but no radiating to the arm.    Limitations Lifting;Writing;House hold activities;Other (comment);Reading;Sitting    How long can you sit comfortably? Increased pain if performing deskwork for > 1/2 day.    How long can you stand comfortably? n/a    How long can you walk comfortably? n/a    Diagnostic tests MRI prior to surgery    Patient Stated Goals To be painfree and work without pain or restrictions as well as sleep better.    Currently in Pain? Yes    Pain Score 3     Pain Location Neck    Pain Orientation Right;Left    Pain Descriptors / Indicators Aching    Pain Type Chronic pain    Pain Onset More than a month ago    Pain Frequency Intermittent  supine: use of heat pad under upper thoracic lower cervical region Cervical sidebend with gentle overpressure 4x60 second holds each direction  cervical rotation with gentle overpressure 4x 60 second holds each direction Suboccipital release 3x30 second holds  RTB ER 15x  RTB overhead stabilization reach /arc 10x  Scapular retraction and cervical chin tuck 10x 5 second holds  cervical extension/flexion with towel under neck 10x   Prone:  J mobilization to upper thoracic/lower cervical for reduced thoracic kyphosis. 4x30 seconds CPA and UPA upper thoracic 4x 15 seconds each level;    Seated: Trunk extension over chair 10x 5 second holds     Pt educated throughout session about proper posture and technique with exercises. Improved exercise technique, movement at  target joints, use of target muscles after min to mod verbal, visual, tactile cues.     Trigger Point Dry Needling (TDN), unbilled Education performed with patient regarding potential benefit of TDN. Reviewed precautions and risks with patient. Reviewed special precautions/risks over lung fields which include pneumothorax. Reviewed signs and symptoms of pneumothorax and advised pt to go to ER immediately if these symptoms develop advise them of dry needling treatment. Extensive time spent with pt to ensure full understanding of TDN risks. Pt provided verbal consent to treatment. TDN performed to  with 0.25 x .40 single needle placements with local twitch response (LTR) to L and R cervical paraspinal above surgical level and bilateral upper traps. Pistoning technique utilized. Improved pain-free motion following intervention. x4 minutes    Patient presents for first treatment with good motivation. She has decreased pain by end of session. Multiple trigger points found in R and L cervical paraspinals.  She is leaving soon for her vacation and educated on proper muscle contraction and posture. She will benefit from continued skilled PT services to progress her ROM, pain relief and assist in returning patient to her previous level of function in home and at work.                     PT Education - 03/09/21 0729     Education Details exercise technique, body mechanics    Person(s) Educated Patient    Methods Explanation;Demonstration;Tactile cues;Verbal cues    Comprehension Verbalized understanding;Returned demonstration;Verbal cues required;Tactile cues required              PT Short Term Goals - 03/04/21 1716       PT SHORT TERM GOAL #1   Title Pt will be independent with HEP in order to improve strength and decrease neck pain in order to improve pain-free function at home and work.    Baseline 10/6: will provide next session    Time 4    Period Weeks    Status New     Target Date 04/01/21               PT Long Term Goals - 03/04/21 1717       PT LONG TERM GOAL #1   Title Patient will increase FOTO score to equal to or greater than  61/100   to demonstrate statistically significant improvement in mobility and quality of life.    Baseline 10/6: 48%    Time 8    Period Weeks    Status New    Target Date 04/29/21      PT LONG TERM GOAL #2   Title Pt will demonstrate decrease in NDI by at least 19% in order to demonstrate clinically significant reduction in disability  related to neck injury/pain    Baseline 10/6: will perform next session    Time 8    Period Weeks    Status New    Target Date 04/29/21      PT LONG TERM GOAL #3   Title Pt will decrease worst neck pain as reported on NPRS by at least 2 points (2/10) in order to demonstrate clinically significant reduction in neck pain.    Baseline 10/6: 4/10    Time 8    Period Weeks    Status New    Target Date 04/29/21      PT LONG TERM GOAL #4   Title Patient will demonstrate improved Cervical ROM by 50% in order to improve her functional ability to turn her head or nod for return of functional capabilities including driving, working, and sleeping.    Baseline 10/6: see note    Time 8    Period Weeks    Status New    Target Date 04/29/21                   Plan - 03/09/21 0820     Clinical Impression Statement Patient presents for first treatment with good motivation. She has decreased pain by end of session. Multiple trigger points found in R and L cervical paraspinals.  She is leaving soon for her vacation and educated on proper muscle contraction and posture. She will benefit from continued skilled PT services to progress her ROM, pain relief and assist in returning patient to her previous level of function in home and at work.    Personal Factors and Comorbidities Age;Comorbidity 3+;Past/Current Experience;Profession;Time since onset of injury/illness/exacerbation     Comorbidities anxiety, CAD, depression, arthritis of cervical region, gastritis, GERD, cancer, stroke, HLD, HTN, IB, lumbar disc disease (fusion x2 ), migraine, numbness of L hand, headaches, and restless leg syndrome    Examination-Activity Limitations Bathing;Bed Mobility;Caring for Others;Carry;Dressing;Lift;Reach Overhead;Sleep    Examination-Participation Restrictions Cleaning;Community Activity;Driving;Laundry;Occupation;Yard Work;Meal Prep    Stability/Clinical Decision Making Evolving/Moderate complexity    Rehab Potential Good    PT Frequency 2x / week    PT Duration 8 weeks    PT Treatment/Interventions ADLs/Self Care Home Management;Cryotherapy;Electrical Stimulation;Moist Heat;Functional mobility training;Therapeutic activities;Therapeutic exercise;Patient/family education;Manual techniques;Passive range of motion;Dry needling;Taping;Spinal Manipulations;Joint Manipulations;Traction;Ultrasound;Gait training;Stair training;Energy conservation;Visual/perceptual remediation/compensation    PT Next Visit Plan manual and therex for cervical ROM pain reduction    PT Home Exercise Plan give next session    Consulted and Agree with Plan of Care Patient             Patient will benefit from skilled therapeutic intervention in order to improve the following deficits and impairments:  Decreased activity tolerance, Decreased endurance, Decreased mobility, Decreased range of motion, Decreased strength, Hypomobility, Impaired flexibility, Increased muscle spasms, Impaired UE functional use, Postural dysfunction, Improper body mechanics, Pain  Visit Diagnosis: Cervicalgia  Abnormal posture  Muscle weakness (generalized)     Problem List Patient Active Problem List   Diagnosis Date Noted   PFO (patent foramen ovale)    CAD (coronary artery disease), native coronary artery    Acute CVA (cerebrovascular accident) ([place]) 06/15/2018   Depression 10/16/2017   Migraine 10/16/2017    Hyperglycemia, unspecified 07/10/2017   Hyperglycemia 07/10/2017   H/O dizziness 08/02/2016   Cervical disc disorder with radiculopathy of cervical region 04/12/2016   Nonallopathic lesion of thoracic region 04/12/2016   Nonallopathic lesion of rib cage 04/12/2016   Facet arthritis of cervical region 03/21/2016  Hyperlipidemia 01/26/2015   Labral tear of shoulder, right, subsequent encounter 12/04/2014   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 [place], [place], [address] Phone: [phone]   Fax:  [phone]  Name: Lori Morales MRN: [id number] Date of Birth: 18-May-1961

## 2021-03-11 ENCOUNTER — Ambulatory Visit: Payer: 59

## 2021-03-11 ENCOUNTER — Other Ambulatory Visit: Payer: Self-pay

## 2021-03-11 DIAGNOSIS — R293 Abnormal posture: Secondary | ICD-10-CM

## 2021-03-11 DIAGNOSIS — M6281 Muscle weakness (generalized): Secondary | ICD-10-CM

## 2021-03-11 DIAGNOSIS — M542 Cervicalgia: Secondary | ICD-10-CM | POA: Diagnosis not present

## 2021-03-11 NOTE — Therapy (Signed)
[place] MAIN [facility] SERVICES [address] [place], [place], [address] Phone: [phone]   Fax:  [phone]  Physical Therapy Treatment  Patient Details  Name: Lori Morales MRN: [id number] Date of Birth: January 08, 1961 Referring Provider (PT): Tama High III MD   Encounter Date: 03/11/2021   PT End of Session - 03/11/21 0814     Visit Number 3    Number of Visits 16    Date for PT Re-Evaluation 04/29/21    Authorization Type 2/10 eval 10/6    PT Start Time 0805    PT Stop Time 0845    PT Time Calculation (min) 40 min    Activity Tolerance Patient tolerated treatment well    Behavior During Therapy [facility] for tasks assessed/performed             Past Medical History:  Diagnosis Date   Arthritis    CAD (coronary artery disease), native coronary artery    mild non calcified plaque in mid LAD and prox LCx on coronary CTA 01/2019   Depression    GERD (gastroesophageal reflux disease)    Headache    migraines   Hx of squamous cell carcinoma 07/11/2016   Right distal dorsum lat forearm    Hypertension    IBS (irritable bowel syndrome)    Palpitations    PFO (patent foramen ovale)    small by cardiac CTA 01/2019   PONV (postoperative nausea and vomiting)    Restless legs syndrome    Squamous cell carcinoma of skin 12/202/2017   right distal dorsum lateral forearm    Past Surgical History:  Procedure Laterality Date   ABDOMINOPLASTY     BACK SURGERY     1 laminectomy, 1 fusion   COLONOSCOPY     COLONOSCOPY WITH PROPOFOL N/A 01/17/2017   Procedure: COLONOSCOPY WITH PROPOFOL;  Surgeon: Lollie Sails, MD;  Location: [facility] ENDOSCOPY;  Service: Endoscopy;  Laterality: N/A;   WISDOM TOOTH EXTRACTION      There were no vitals filed for this visit.   Subjective Assessment - 03/11/21 0813     Subjective Patient reports worst pain of 3/10 since last session, currently is 2/10 . Leaves this weekend for [place] cruise.     Pertinent History Patient is returning back to physical therapy for her neck. Patient has a history of C6-7 arthroplasty and has been treated by this clinician before. PMH includes anxiety, CAD, depression, arthritis of cervical region, gastritis, GERD, cancer, stroke, HLD, HTN, IB, lumbar disc disease (fusion x2 ), migraine, numbness of L hand, headaches, and restless leg syndrome. R sided neck pain with L sided tightness, but no radiating to the arm.    Limitations Lifting;Writing;House hold activities;Other (comment);Reading;Sitting    How long can you sit comfortably? Increased pain if performing deskwork for > 1/2 day.    How long can you stand comfortably? n/a    How long can you walk comfortably? n/a    Diagnostic tests MRI prior to surgery    Patient Stated Goals To be painfree and work without pain or restrictions as well as sleep better.    Currently in Pain? Yes    Pain Score 2     Pain Location Neck    Pain Descriptors / Indicators Aching    Pain Type Chronic pain    Pain Onset More than a month ago                Treatment:  supine: use of heat pad under upper thoracic lower cervical region Cervical sidebend with gentle overpressure 4x60 second holds each direction  cervical rotation with gentle overpressure 4x 60 second holds each direction Suboccipital release 3x30 second holds  RTB ER 15x  RTB overhead stabilization reach /arc 10x  Scapular retraction and cervical chin tuck 10x 5 second holds  cervical extension/flexion with towel under neck 10x   Prone:  J mobilization to upper thoracic/lower cervical for reduced thoracic kyphosis. 4x30 seconds CPA and UPA upper thoracic 4x 15 seconds each level;    Seated: Trunk extension over chair 10x 5 second holds      Pt educated throughout session about proper posture and technique with exercises. Improved exercise technique, movement at target joints, use of target muscles after min to mod verbal, visual, tactile  cues.     Trigger Point Dry Needling (TDN), unbilled Education performed with patient regarding potential benefit of TDN. Reviewed precautions and risks with patient. Reviewed special precautions/risks over lung fields which include pneumothorax. Reviewed signs and symptoms of pneumothorax and advised pt to go to ER immediately if these symptoms develop advise them of dry needling treatment. Extensive time spent with pt to ensure full understanding of TDN risks. Pt provided verbal consent to treatment. TDN performed to  with 0.25 x .40 single needle placements with local twitch response (LTR) to L and R cervical paraspinal above surgical level and bilateral upper traps. Pistoning technique utilized. Improved pain-free motion following intervention. x4 minutes          Patient presents with decreased pain to PT session. Strengthening and postural interventions performed after TDN and manual for optimal patient performance. Patient will be leaving for two week trip to [place] this week. Will return as needed after vacation. She will benefit from continued skilled PT services to progress her ROM, pain relief and assist in returning patient to her previous level of function in home and at work.              PT Education - 03/11/21 0814     Education Details exercise technique, body mechanics    Person(s) Educated Patient    Methods Explanation;Demonstration;Tactile cues;Verbal cues    Comprehension Verbalized understanding;Returned demonstration;Verbal cues required;Tactile cues required              PT Short Term Goals - 03/04/21 1716       PT SHORT TERM GOAL #1   Title Pt will be independent with HEP in order to improve strength and decrease neck pain in order to improve pain-free function at home and work.    Baseline 10/6: will provide next session    Time 4    Period Weeks    Status New    Target Date 04/01/21               PT Long Term Goals - 03/04/21 1717        PT LONG TERM GOAL #1   Title Patient will increase FOTO score to equal to or greater than  61/100   to demonstrate statistically significant improvement in mobility and quality of life.    Baseline 10/6: 48%    Time 8    Period Weeks    Status New    Target Date 04/29/21      PT LONG TERM GOAL #2   Title Pt will demonstrate decrease in NDI by at least 19% in order to demonstrate clinically significant reduction in disability related to neck  injury/pain    Baseline 10/6: will perform next session    Time 8    Period Weeks    Status New    Target Date 04/29/21      PT LONG TERM GOAL #3   Title Pt will decrease worst neck pain as reported on NPRS by at least 2 points (2/10) in order to demonstrate clinically significant reduction in neck pain.    Baseline 10/6: 4/10    Time 8    Period Weeks    Status New    Target Date 04/29/21      PT LONG TERM GOAL #4   Title Patient will demonstrate improved Cervical ROM by 50% in order to improve her functional ability to turn her head or nod for return of functional capabilities including driving, working, and sleeping.    Baseline 10/6: see note    Time 8    Period Weeks    Status New    Target Date 04/29/21                   Plan - 03/11/21 1259     Clinical Impression Statement Patient presents with decreased pain to PT session. Strengthening and postural interventions performed after TDN and manual for optimal patient performance. Patient will be leaving for two week trip to [place] this week. Will return as needed after vacation. She will benefit from continued skilled PT services to progress her ROM, pain relief and assist in returning patient to her previous level of function in home and at work.    Personal Factors and Comorbidities Age;Comorbidity 3+;Past/Current Experience;Profession;Time since onset of injury/illness/exacerbation    Comorbidities anxiety, CAD, depression, arthritis of cervical region, gastritis,  GERD, cancer, stroke, HLD, HTN, IB, lumbar disc disease (fusion x2 ), migraine, numbness of L hand, headaches, and restless leg syndrome    Examination-Activity Limitations Bathing;Bed Mobility;Caring for Others;Carry;Dressing;Lift;Reach Overhead;Sleep    Examination-Participation Restrictions Cleaning;Community Activity;Driving;Laundry;Occupation;Yard Work;Meal Prep    Stability/Clinical Decision Making Evolving/Moderate complexity    Rehab Potential Good    PT Frequency 2x / week    PT Duration 8 weeks    PT Treatment/Interventions ADLs/Self Care Home Management;Cryotherapy;Electrical Stimulation;Moist Heat;Functional mobility training;Therapeutic activities;Therapeutic exercise;Patient/family education;Manual techniques;Passive range of motion;Dry needling;Taping;Spinal Manipulations;Joint Manipulations;Traction;Ultrasound;Gait training;Stair training;Energy conservation;Visual/perceptual remediation/compensation    PT Next Visit Plan manual and therex for cervical ROM pain reduction    PT Home Exercise Plan give next session    Consulted and Agree with Plan of Care Patient             Patient will benefit from skilled therapeutic intervention in order to improve the following deficits and impairments:  Decreased activity tolerance, Decreased endurance, Decreased mobility, Decreased range of motion, Decreased strength, Hypomobility, Impaired flexibility, Increased muscle spasms, Impaired UE functional use, Postural dysfunction, Improper body mechanics, Pain  Visit Diagnosis: Cervicalgia  Abnormal posture  Muscle weakness (generalized)     Problem List Patient Active Problem List   Diagnosis Date Noted   PFO (patent foramen ovale)    CAD (coronary artery disease), native coronary artery    Acute CVA (cerebrovascular accident) ([place]) 06/15/2018   Depression 10/16/2017   Migraine 10/16/2017   Hyperglycemia, unspecified 07/10/2017   Hyperglycemia 07/10/2017   H/O dizziness  08/02/2016   Cervical disc disorder with radiculopathy of cervical region 04/12/2016   Nonallopathic lesion of thoracic region 04/12/2016   Nonallopathic lesion of rib cage 04/12/2016   Facet arthritis of cervical region 03/21/2016   Hyperlipidemia 01/26/2015  Labral tear of shoulder, right, subsequent encounter 12/04/2014   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 [place], [place], [address] Phone: [phone]   Fax:  [phone]  Name: Lori Morales MRN: [id number] Date of Birth: 1960/06/10

## 2021-03-28 ENCOUNTER — Other Ambulatory Visit: Payer: Self-pay

## 2021-03-29 ENCOUNTER — Other Ambulatory Visit: Payer: Self-pay

## 2021-03-29 MED ORDER — LOSARTAN POTASSIUM 25 MG PO TABS
25.0000 mg | ORAL_TABLET | Freq: Every day | ORAL | 1 refills | Status: DC
  Filled 2021-03-29: qty 90, 90d supply, fill #0
  Filled 2021-07-05: qty 90, 90d supply, fill #1

## 2021-03-30 ENCOUNTER — Other Ambulatory Visit: Payer: Self-pay

## 2021-03-31 ENCOUNTER — Ambulatory Visit: Payer: 59 | Attending: Internal Medicine

## 2021-03-31 ENCOUNTER — Other Ambulatory Visit: Payer: Self-pay

## 2021-03-31 DIAGNOSIS — R293 Abnormal posture: Secondary | ICD-10-CM | POA: Diagnosis not present

## 2021-03-31 DIAGNOSIS — M6281 Muscle weakness (generalized): Secondary | ICD-10-CM | POA: Diagnosis not present

## 2021-03-31 DIAGNOSIS — M542 Cervicalgia: Secondary | ICD-10-CM | POA: Insufficient documentation

## 2021-03-31 NOTE — Therapy (Signed)
[place] MAIN [facility] SERVICES [address] [place], [place], [address] Phone: [phone]   Fax:  [phone]  Physical Therapy Treatment  Patient Details  Name: Lori Morales MRN: [id number] Date of Birth: [birth year] Referring Provider (PT): Tama High III MD   Encounter Date: 03/31/2021   PT End of Session - 03/31/21 0919     Visit Number 4    Number of Visits 16    Date for PT Re-Evaluation 04/29/21    Authorization Type 4/10 eval 10/6    PT Start Time 0845    PT Stop Time 0929    PT Time Calculation (min) 44 min    Activity Tolerance Patient tolerated treatment well    Behavior During Therapy [facility] for tasks assessed/performed             Past Medical History:  Diagnosis Date   Arthritis    CAD (coronary artery disease), native coronary artery    mild non calcified plaque in mid LAD and prox LCx on coronary CTA 01/2019   Depression    GERD (gastroesophageal reflux disease)    Headache    migraines   Hx of squamous cell carcinoma 07/11/2016   Right distal dorsum lat forearm    Hypertension    IBS (irritable bowel syndrome)    Palpitations    PFO (patent foramen ovale)    small by cardiac CTA 01/2019   PONV (postoperative nausea and vomiting)    Restless legs syndrome    Squamous cell carcinoma of skin 12/202/2017   right distal dorsum lateral forearm    Past Surgical History:  Procedure Laterality Date   ABDOMINOPLASTY     BACK SURGERY     1 laminectomy, 1 fusion   COLONOSCOPY     COLONOSCOPY WITH PROPOFOL N/A 01/17/2017   Procedure: COLONOSCOPY WITH PROPOFOL;  Surgeon: Lollie Sails, MD;  Location: [facility] ENDOSCOPY;  Service: Endoscopy;  Laterality: N/A;   WISDOM TOOTH EXTRACTION      There were no vitals filed for this visit.   Subjective Assessment - 03/31/21 0916     Subjective Patient returned from [place] trip. Has been doing well until customs in [place] and now has had pain radiating down  bilateral arms.    Pertinent History Patient is returning back to physical therapy for her neck. Patient has a history of C6-7 arthroplasty and has been treated by this clinician before. PMH includes anxiety, CAD, depression, arthritis of cervical region, gastritis, GERD, cancer, stroke, HLD, HTN, IB, lumbar disc disease (fusion x2 ), migraine, numbness of L hand, headaches, and restless leg syndrome. R sided neck pain with L sided tightness, but no radiating to the arm.    Limitations Lifting;Writing;House hold activities;Other (comment);Reading;Sitting    How long can you sit comfortably? Increased pain if performing deskwork for > 1/2 day.    How long can you stand comfortably? n/a    How long can you walk comfortably? n/a    Diagnostic tests MRI prior to surgery    Patient Stated Goals To be painfree and work without pain or restrictions as well as sleep better.    Currently in Pain? Yes    Pain Score 2     Pain Location Neck    Pain Orientation Right;Left    Pain Descriptors / Indicators Aching    Pain Type Chronic pain    Pain Onset More than a month ago    Pain Frequency Intermittent  Treatment:     supine: use of heat pad under upper thoracic lower cervical region Cervical sidebend with gentle overpressure 4x60 second holds each direction  cervical rotation with gentle overpressure 4x 60 second holds each direction Suboccipital release 3x30 second holds  RTB ER 15x  RTB overhead stabilization reach /arc 10x  cervical extension/flexion with towel under neck 10x Chin tuck 10x 5 second hold Median nerve glide 10x LUE, 10x RUE    Prone:  J mobilization to upper thoracic/lower cervical for reduced thoracic kyphosis. 4x30 seconds CPA and UPA upper thoracic 4x 15 seconds each level;    Seated: Trunk extension over chair 10x 5 second holds      Pt educated throughout session about proper posture and technique with exercises. Improved exercise technique,  movement at target joints, use of target muscles after min to mod verbal, visual, tactile cues.     Trigger Point Dry Needling (TDN), unbilled Education performed with patient regarding potential benefit of TDN. Reviewed precautions and risks with patient. Reviewed special precautions/risks over lung fields which include pneumothorax. Reviewed signs and symptoms of pneumothorax and advised pt to go to ER immediately if these symptoms develop advise them of dry needling treatment. Extensive time spent with pt to ensure full understanding of TDN risks. Pt provided verbal consent to treatment. TDN performed to  with 0.25 x .40 single needle placements with local twitch response (LTR) to bilateral upper traps. Pistoning technique utilized. Improved pain-free motion following intervention. x4 minutes       Patient has returned from her trip to [place]. She has diffuse upper trap tightness and hypomobility of cervical spine that is relieved with manual, therex, and TDN. Patient tolerates postural strengthening well and reports relief of symptoms by end of session. She will benefit from continued skilled PT services to progress her ROM, pain relief and assist in returning patient to her previous level of function in home and at work.                        PT Education - 03/31/21 0918     Education Details exercise technique, body mechanics    Person(s) Educated Patient    Methods Explanation;Demonstration;Tactile cues;Verbal cues    Comprehension Verbalized understanding;Returned demonstration;Verbal cues required;Tactile cues required              PT Short Term Goals - 03/04/21 1716       PT SHORT TERM GOAL #1   Title Pt will be independent with HEP in order to improve strength and decrease neck pain in order to improve pain-free function at home and work.    Baseline 10/6: will provide next session    Time 4    Period Weeks    Status New    Target Date 04/01/21                PT Long Term Goals - 03/04/21 1717       PT LONG TERM GOAL #1   Title Patient will increase FOTO score to equal to or greater than  61/100   to demonstrate statistically significant improvement in mobility and quality of life.    Baseline 10/6: 48%    Time 8    Period Weeks    Status New    Target Date 04/29/21      PT LONG TERM GOAL #2   Title Pt will demonstrate decrease in NDI by at least 19% in order to demonstrate  clinically significant reduction in disability related to neck injury/pain    Baseline 10/6: will perform next session    Time 8    Period Weeks    Status New    Target Date 04/29/21      PT LONG TERM GOAL #3   Title Pt will decrease worst neck pain as reported on NPRS by at least 2 points (2/10) in order to demonstrate clinically significant reduction in neck pain.    Baseline 10/6: 4/10    Time 8    Period Weeks    Status New    Target Date 04/29/21      PT LONG TERM GOAL #4   Title Patient will demonstrate improved Cervical ROM by 50% in order to improve her functional ability to turn her head or nod for return of functional capabilities including driving, working, and sleeping.    Baseline 10/6: see note    Time 8    Period Weeks    Status New    Target Date 04/29/21                   Plan - 03/31/21 1049     Clinical Impression Statement Patient has returned from her trip to [place]. She has diffuse upper trap tightness and hypomobility of cervical spine that is relieved with manual, therex, and TDN. Patient tolerates postural strengthening well and reports relief of symptoms by end of session. She will benefit from continued skilled PT services to progress her ROM, pain relief and assist in returning patient to her previous level of function in home and at work.    Personal Factors and Comorbidities Age;Comorbidity 3+;Past/Current Experience;Profession;Time since onset of injury/illness/exacerbation    Comorbidities anxiety, CAD,  depression, arthritis of cervical region, gastritis, GERD, cancer, stroke, HLD, HTN, IB, lumbar disc disease (fusion x2 ), migraine, numbness of L hand, headaches, and restless leg syndrome    Examination-Activity Limitations Bathing;Bed Mobility;Caring for Others;Carry;Dressing;Lift;Reach Overhead;Sleep    Examination-Participation Restrictions Cleaning;Community Activity;Driving;Laundry;Occupation;Yard Work;Meal Prep    Stability/Clinical Decision Making Evolving/Moderate complexity    Rehab Potential Good    PT Frequency 2x / week    PT Duration 8 weeks    PT Treatment/Interventions ADLs/Self Care Home Management;Cryotherapy;Electrical Stimulation;Moist Heat;Functional mobility training;Therapeutic activities;Therapeutic exercise;Patient/family education;Manual techniques;Passive range of motion;Dry needling;Taping;Spinal Manipulations;Joint Manipulations;Traction;Ultrasound;Gait training;Stair training;Energy conservation;Visual/perceptual remediation/compensation    PT Next Visit Plan manual and therex for cervical ROM pain reduction    PT Home Exercise Plan give next session    Consulted and Agree with Plan of Care Patient             Patient will benefit from skilled therapeutic intervention in order to improve the following deficits and impairments:  Decreased activity tolerance, Decreased endurance, Decreased mobility, Decreased range of motion, Decreased strength, Hypomobility, Impaired flexibility, Increased muscle spasms, Impaired UE functional use, Postural dysfunction, Improper body mechanics, Pain  Visit Diagnosis: Cervicalgia  Muscle weakness (generalized)  Abnormal posture     Problem List Patient Active Problem List   Diagnosis Date Noted   PFO (patent foramen ovale)    CAD (coronary artery disease), native coronary artery    Acute CVA (cerebrovascular accident) ([place]) 06/15/2018   Depression 10/16/2017   Migraine 10/16/2017   Hyperglycemia, unspecified  07/10/2017   Hyperglycemia 07/10/2017   H/O dizziness 08/02/2016   Cervical disc disorder with radiculopathy of cervical region 04/12/2016   Nonallopathic lesion of thoracic region 04/12/2016   Nonallopathic lesion of rib cage 04/12/2016   Facet arthritis  of cervical region 03/21/2016   Hyperlipidemia 01/26/2015   Labral tear of shoulder, right, subsequent encounter 12/04/2014   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

## 2021-04-02 ENCOUNTER — Other Ambulatory Visit: Payer: Self-pay

## 2021-04-02 ENCOUNTER — Ambulatory Visit: Payer: 59

## 2021-04-02 DIAGNOSIS — R293 Abnormal posture: Secondary | ICD-10-CM | POA: Diagnosis not present

## 2021-04-02 DIAGNOSIS — M542 Cervicalgia: Secondary | ICD-10-CM | POA: Diagnosis not present

## 2021-04-02 DIAGNOSIS — M6281 Muscle weakness (generalized): Secondary | ICD-10-CM | POA: Diagnosis not present

## 2021-04-02 NOTE — Therapy (Signed)
[place] MAIN [facility] SERVICES [address] [place], [place], [address] Phone: [phone]   Fax:  [phone]  Physical Therapy Treatment  Patient Details  Name: Lori Morales MRN: [id number] Date of Birth: 04-Jun-1960 Referring Provider (PT): Tama High III MD   Encounter Date: 04/02/2021   PT End of Session - 04/02/21 1113     Visit Number 5    Number of Visits 16    Date for PT Re-Evaluation 04/29/21    Authorization Type 5/10 eval 10/6    PT Start Time 0849    PT Stop Time 0929    PT Time Calculation (min) 40 min    Activity Tolerance Patient tolerated treatment well    Behavior During Therapy [facility] for tasks assessed/performed             Past Medical History:  Diagnosis Date   Arthritis    CAD (coronary artery disease), native coronary artery    mild non calcified plaque in mid LAD and prox LCx on coronary CTA 01/2019   Depression    GERD (gastroesophageal reflux disease)    Headache    migraines   Hx of squamous cell carcinoma 07/11/2016   Right distal dorsum lat forearm    Hypertension    IBS (irritable bowel syndrome)    Palpitations    PFO (patent foramen ovale)    small by cardiac CTA 01/2019   PONV (postoperative nausea and vomiting)    Restless legs syndrome    Squamous cell carcinoma of skin 12/202/2017   right distal dorsum lateral forearm    Past Surgical History:  Procedure Laterality Date   ABDOMINOPLASTY     BACK SURGERY     1 laminectomy, 1 fusion   COLONOSCOPY     COLONOSCOPY WITH PROPOFOL N/A 01/17/2017   Procedure: COLONOSCOPY WITH PROPOFOL;  Surgeon: Lollie Sails, MD;  Location: [facility] ENDOSCOPY;  Service: Endoscopy;  Laterality: N/A;   WISDOM TOOTH EXTRACTION      There were no vitals filed for this visit.   Subjective Assessment - 04/02/21 1114     Subjective Patient reports pain has improved- "Last night was not good but today - no numbness or tingling into hands/fingers and pain  is around a 2/10 - mostly on right upper neck."    Pertinent History Patient is returning back to physical therapy for her neck. Patient has a history of C6-7 arthroplasty and has been treated by this clinician before. PMH includes anxiety, CAD, depression, arthritis of cervical region, gastritis, GERD, cancer, stroke, HLD, HTN, IB, lumbar disc disease (fusion x2 ), migraine, numbness of L hand, headaches, and restless leg syndrome. R sided neck pain with L sided tightness, but no radiating to the arm.    Limitations Lifting;Writing;House hold activities;Other (comment);Reading;Sitting    How long can you sit comfortably? Increased pain if performing deskwork for > 1/2 day.    How long can you stand comfortably? n/a    How long can you walk comfortably? n/a    Diagnostic tests MRI prior to surgery    Patient Stated Goals To be painfree and work without pain or restrictions as well as sleep better.    Currently in Pain? Yes    Pain Score 2     Pain Location Neck    Pain Orientation Right;Left;Posterior;Upper    Pain Descriptors / Indicators Aching    Pain Type Chronic pain    Pain Frequency Intermittent  INTERVENTIONS:    Pre- therapy Cervical ROM=  Rotation (L/R)= 55/45 Side bending (L/R) = 20/27   Manual Therapy    In supine:  Cervical sidebend with gentle overpressure 3 x 30 second holds each direction  cervical rotation with gentle overpressure 3 x 30 second holds each direction Suboccipital release 3x 60 second holds - Increased mm tightness noted in right Upper cervical region.  Prone:  PA mobilization grad III-IV along T3-T8 region upper  and mid thoracic for reduced thoracic kyphosis. STM to right side Upper trap and upper cervical region x 5 min   Reviewed Median and ulnar nerve glides - Issued HEP today    Access Code: [email or web address] URL: [email or web address] Date: 04/02/2021 Prepared by: Sande Brothers PT  Exercises Ulnar Nerve  Flossing - 1 x daily - 7 x weekly - 2 sets - 3-5 reps Standing Median Nerve Glide - 1 x daily - 7 x weekly - 2-3 sets - 3-5 reps Median Nerve Flossing - Tray - 1 x daily - 7 x weekly - 2-3 sets - 3-5 reps    Post- therapy Cervical ROM=  Rotation (L/R)= 62/51 deg Side bending (L/R) = 24/27 deg    Pt educated throughout session about proper posture and technique with exercises. Improved exercise technique, movement at target joints, use of target muscles after min to mod verbal, visual, tactile cues.        Clinical Impression: Patient did present with improved overall cervical ROM after performing manual techniques and presents with no radiculopathy today. She continues to present with overall upper cervical musculature tightness and along right UT and mid thoracic region. No worsening of symptoms during visit and patient reports pain unchanged from 2/10 yet reports "feeling looser." Reviewed median and ulnar nerve glide/floss and issued home program today.  She will benefit from continued skilled PT services to progress her ROM, pain relief and assist in returning patient to her previous level of function in home and at work.                                PT Education - 04/02/21 1116     Education Details Nerve glide ed; exercise techniques    Person(s) Educated Patient    Methods Explanation;Demonstration;Tactile cues;Verbal cues;Handout    Comprehension Verbalized understanding;Returned demonstration;Verbal cues required;Tactile cues required;Need further instruction              PT Short Term Goals - 03/04/21 1716       PT SHORT TERM GOAL #1   Title Pt will be independent with HEP in order to improve strength and decrease neck pain in order to improve pain-free function at home and work.    Baseline 10/6: will provide next session    Time 4    Period Weeks    Status New    Target Date 04/01/21               PT Long Term Goals - 03/04/21 1717        PT LONG TERM GOAL #1   Title Patient will increase FOTO score to equal to or greater than  61/100   to demonstrate statistically significant improvement in mobility and quality of life.    Baseline 10/6: 48%    Time 8    Period Weeks    Status New    Target Date 04/29/21      PT LONG TERM GOAL #2  Title Pt will demonstrate decrease in NDI by at least 19% in order to demonstrate clinically significant reduction in disability related to neck injury/pain    Baseline 10/6: will perform next session    Time 8    Period Weeks    Status New    Target Date 04/29/21      PT LONG TERM GOAL #3   Title Pt will decrease worst neck pain as reported on NPRS by at least 2 points (2/10) in order to demonstrate clinically significant reduction in neck pain.    Baseline 10/6: 4/10    Time 8    Period Weeks    Status New    Target Date 04/29/21      PT LONG TERM GOAL #4   Title Patient will demonstrate improved Cervical ROM by 50% in order to improve her functional ability to turn her head or nod for return of functional capabilities including driving, working, and sleeping.    Baseline 10/6: see note    Time 8    Period Weeks    Status New    Target Date 04/29/21                   Plan - 04/02/21 1058     Clinical Impression Statement Patient did present with improved overall cervical ROM after performing manual techniques and presents with no radiculopathy today. She continues to present with overall upper cervical musculature tightness and along right UT and mid thoracic region. No worsening of symptoms during visit and patient reports pain unchanged from 2/10 yet reports "feeling looser." Reviewed median and ulnar nerve glide/floss and issued home program today.  She will benefit from continued skilled PT services to progress her ROM, pain relief and assist in returning patient to her previous level of function in home and at work.    Personal Factors and Comorbidities  Age;Comorbidity 3+;Past/Current Experience;Profession;Time since onset of injury/illness/exacerbation    Comorbidities anxiety, CAD, depression, arthritis of cervical region, gastritis, GERD, cancer, stroke, HLD, HTN, IB, lumbar disc disease (fusion x2 ), migraine, numbness of L hand, headaches, and restless leg syndrome    Examination-Activity Limitations Bathing;Bed Mobility;Caring for Others;Carry;Dressing;Lift;Reach Overhead;Sleep    Examination-Participation Restrictions Cleaning;Community Activity;Driving;Laundry;Occupation;Yard Work;Meal Prep    Stability/Clinical Decision Making Evolving/Moderate complexity    Rehab Potential Good    PT Frequency 2x / week    PT Duration 8 weeks    PT Treatment/Interventions ADLs/Self Care Home Management;Cryotherapy;Electrical Stimulation;Moist Heat;Functional mobility training;Therapeutic activities;Therapeutic exercise;Patient/family education;Manual techniques;Passive range of motion;Dry needling;Taping;Spinal Manipulations;Joint Manipulations;Traction;Ultrasound;Gait training;Stair training;Energy conservation;Visual/perceptual remediation/compensation    PT Next Visit Plan manual and therex for cervical ROM pain reduction    PT Home Exercise Plan 04/02/2021= Access Code: [email or web address]    Consulted and Agree with Plan of Care Patient             Patient will benefit from skilled therapeutic intervention in order to improve the following deficits and impairments:  Decreased activity tolerance, Decreased endurance, Decreased mobility, Decreased range of motion, Decreased strength, Hypomobility, Impaired flexibility, Increased muscle spasms, Impaired UE functional use, Postural dysfunction, Improper body mechanics, Pain  Visit Diagnosis: Cervicalgia     Problem List Patient Active Problem List   Diagnosis Date Noted   PFO (patent foramen ovale)    CAD (coronary artery disease), native coronary artery    Acute CVA (cerebrovascular accident) ([place])  06/15/2018   Depression 10/16/2017   Migraine 10/16/2017   Hyperglycemia, unspecified 07/10/2017   Hyperglycemia 07/10/2017   H/O  dizziness 08/02/2016   Cervical disc disorder with radiculopathy of cervical region 04/12/2016   Nonallopathic lesion of thoracic region 04/12/2016   Nonallopathic lesion of rib cage 04/12/2016   Facet arthritis of cervical region 03/21/2016   Hyperlipidemia 01/26/2015   Labral tear of shoulder, right, subsequent encounter 12/04/2014   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

## 2021-04-06 ENCOUNTER — Other Ambulatory Visit: Payer: Self-pay

## 2021-04-06 ENCOUNTER — Ambulatory Visit: Payer: 59

## 2021-04-06 DIAGNOSIS — M6281 Muscle weakness (generalized): Secondary | ICD-10-CM

## 2021-04-06 DIAGNOSIS — R293 Abnormal posture: Secondary | ICD-10-CM | POA: Diagnosis not present

## 2021-04-06 DIAGNOSIS — M542 Cervicalgia: Secondary | ICD-10-CM | POA: Diagnosis not present

## 2021-04-06 NOTE — Therapy (Signed)
[place] MAIN [facility] SERVICES [address] [place], [place], [address] Phone: [phone]   Fax:  [phone]  Physical Therapy Treatment  Patient Details  Name: Lori Morales MRN: [id number] Date of Birth: [birth year] Referring Provider (PT): Tama High III MD   Encounter Date: 04/06/2021   PT End of Session - 04/06/21 1200     Visit Number 6    Number of Visits 16    Date for PT Re-Evaluation 04/29/21    Authorization Type 6/10 eval 10/6    PT Start Time 0717    PT Stop Time 0759    PT Time Calculation (min) 42 min    Activity Tolerance Patient tolerated treatment well    Behavior During Therapy [facility] for tasks assessed/performed             Past Medical History:  Diagnosis Date   Arthritis    CAD (coronary artery disease), native coronary artery    mild non calcified plaque in mid LAD and prox LCx on coronary CTA 01/2019   Depression    GERD (gastroesophageal reflux disease)    Headache    migraines   Hx of squamous cell carcinoma 07/11/2016   Right distal dorsum lat forearm    Hypertension    IBS (irritable bowel syndrome)    Palpitations    PFO (patent foramen ovale)    small by cardiac CTA 01/2019   PONV (postoperative nausea and vomiting)    Restless legs syndrome    Squamous cell carcinoma of skin 12/202/2017   right distal dorsum lateral forearm    Past Surgical History:  Procedure Laterality Date   ABDOMINOPLASTY     BACK SURGERY     1 laminectomy, 1 fusion   COLONOSCOPY     COLONOSCOPY WITH PROPOFOL N/A 01/17/2017   Procedure: COLONOSCOPY WITH PROPOFOL;  Surgeon: Lollie Sails, MD;  Location: [facility] ENDOSCOPY;  Service: Endoscopy;  Laterality: N/A;   WISDOM TOOTH EXTRACTION      There were no vitals filed for this visit.   Subjective Assessment - 04/06/21 1159     Subjective Patient reports symptoms improved immediately after last session but returned the following day. Did some light sewing and  reading.    Pertinent History Patient is returning back to physical therapy for her neck. Patient has a history of C6-7 arthroplasty and has been treated by this clinician before. PMH includes anxiety, CAD, depression, arthritis of cervical region, gastritis, GERD, cancer, stroke, HLD, HTN, IB, lumbar disc disease (fusion x2 ), migraine, numbness of L hand, headaches, and restless leg syndrome. R sided neck pain with L sided tightness, but no radiating to the arm.    Limitations Lifting;Writing;House hold activities;Other (comment);Reading;Sitting    How long can you sit comfortably? Increased pain if performing deskwork for > 1/2 day.    How long can you stand comfortably? n/a    How long can you walk comfortably? n/a    Diagnostic tests MRI prior to surgery    Patient Stated Goals To be painfree and work without pain or restrictions as well as sleep better.    Currently in Pain? Yes    Pain Score 2     Pain Location Neck    Pain Orientation Right;Left    Pain Descriptors / Indicators Aching    Pain Type Chronic pain    Pain Onset More than a month ago    Pain Frequency Intermittent  Treatment:     supine: use of heat pad under upper thoracic lower cervical region Cervical sidebend with gentle overpressure 4x60 second holds each direction  cervical rotation with gentle overpressure 4x 60 second holds each direction Suboccipital release 3x30 second holds  Median nerve glide 10x LUE, 10x RUE  Ulnar nerve glide: 10x LUE 10x RUE   AP RUE shoulder grade II mobilizations for pain reduction 8x 15 seconds Distraction RUE 10x 10 second holds  Prone:  J mobilization to upper thoracic/lower cervical for reduced thoracic kyphosis. 4x30 seconds CPA and UPA upper thoracic 4x 15 seconds each level;                  Pt educated throughout session about proper posture and technique with exercises. Improved exercise technique, movement at target joints, use of  target muscles after min to mod verbal, visual, tactile cues.     Trigger Point Dry Needling (TDN), unbilled Education performed with patient regarding potential benefit of TDN. Reviewed precautions and risks with patient. Reviewed special precautions/risks over lung fields which include pneumothorax. Reviewed signs and symptoms of pneumothorax and advised pt to go to ER immediately if these symptoms develop advise them of dry needling treatment. Extensive time spent with pt to ensure full understanding of TDN risks. Pt provided verbal consent to treatment. TDN performed to  with 0.25 x .40 single needle placements with local twitch response (LTR) to bilateral upper traps. Pistoning technique utilized. Improved pain-free motion following intervention. x8 minutes      Patient presents with increased upper trap tension requiring focused TDN and manual for relief. Nerve symptoms resolved with glides, mobilizations, shoulder distraction. Patient reports relief of symptoms by end of session. Cervical mobilizations do continue to be painful and will be an area of focus for further sessions. She will benefit from continued skilled PT services to progress her ROM, pain relief and assist in returning patient to her previous level of function in home and at work.              PT Education - 04/06/21 1200     Education Details exercise technique, body mechanics, nerve glides    Person(s) Educated Patient    Methods Explanation;Demonstration;Verbal cues;Tactile cues    Comprehension Verbalized understanding;Returned demonstration;Verbal cues required;Tactile cues required              PT Short Term Goals - 03/04/21 1716       PT SHORT TERM GOAL #1   Title Pt will be independent with HEP in order to improve strength and decrease neck pain in order to improve pain-free function at home and work.    Baseline 10/6: will provide next session    Time 4    Period Weeks    Status New    Target  Date 04/01/21               PT Long Term Goals - 03/04/21 1717       PT LONG TERM GOAL #1   Title Patient will increase FOTO score to equal to or greater than  61/100   to demonstrate statistically significant improvement in mobility and quality of life.    Baseline 10/6: 48%    Time 8    Period Weeks    Status New    Target Date 04/29/21      PT LONG TERM GOAL #2   Title Pt will demonstrate decrease in NDI by at least 19% in order to demonstrate  clinically significant reduction in disability related to neck injury/pain    Baseline 10/6: will perform next session    Time 8    Period Weeks    Status New    Target Date 04/29/21      PT LONG TERM GOAL #3   Title Pt will decrease worst neck pain as reported on NPRS by at least 2 points (2/10) in order to demonstrate clinically significant reduction in neck pain.    Baseline 10/6: 4/10    Time 8    Period Weeks    Status New    Target Date 04/29/21      PT LONG TERM GOAL #4   Title Patient will demonstrate improved Cervical ROM by 50% in order to improve her functional ability to turn her head or nod for return of functional capabilities including driving, working, and sleeping.    Baseline 10/6: see note    Time 8    Period Weeks    Status New    Target Date 04/29/21                   Plan - 04/06/21 1209     Clinical Impression Statement Patient presents with increased upper trap tension requiring focused TDN and manual for relief. Nerve symptoms resolved with glides, mobilizations, shoulder distraction. Patient reports relief of symptoms by end of session. Cervical mobilizations do continue to be painful and will be an area of focus for further sessions. She will benefit from continued skilled PT services to progress her ROM, pain relief and assist in returning patient to her previous level of function in home and at work.    Personal Factors and Comorbidities Age;Comorbidity 3+;Past/Current  Experience;Profession;Time since onset of injury/illness/exacerbation    Comorbidities anxiety, CAD, depression, arthritis of cervical region, gastritis, GERD, cancer, stroke, HLD, HTN, IB, lumbar disc disease (fusion x2 ), migraine, numbness of L hand, headaches, and restless leg syndrome    Examination-Activity Limitations Bathing;Bed Mobility;Caring for Others;Carry;Dressing;Lift;Reach Overhead;Sleep    Examination-Participation Restrictions Cleaning;Community Activity;Driving;Laundry;Occupation;Yard Work;Meal Prep    Stability/Clinical Decision Making Evolving/Moderate complexity    Rehab Potential Good    PT Frequency 2x / week    PT Duration 8 weeks    PT Treatment/Interventions ADLs/Self Care Home Management;Cryotherapy;Electrical Stimulation;Moist Heat;Functional mobility training;Therapeutic activities;Therapeutic exercise;Patient/family education;Manual techniques;Passive range of motion;Dry needling;Taping;Spinal Manipulations;Joint Manipulations;Traction;Ultrasound;Gait training;Stair training;Energy conservation;Visual/perceptual remediation/compensation    PT Next Visit Plan manual and therex for cervical ROM pain reduction    PT Home Exercise Plan 04/02/2021= Access Code: [email or web address]    Consulted and Agree with Plan of Care Patient             Patient will benefit from skilled therapeutic intervention in order to improve the following deficits and impairments:  Decreased activity tolerance, Decreased endurance, Decreased mobility, Decreased range of motion, Decreased strength, Hypomobility, Impaired flexibility, Increased muscle spasms, Impaired UE functional use, Postural dysfunction, Improper body mechanics, Pain  Visit Diagnosis: Cervicalgia  Muscle weakness (generalized)  Abnormal posture     Problem List Patient Active Problem List   Diagnosis Date Noted   PFO (patent foramen ovale)    CAD (coronary artery disease), native coronary artery    Acute CVA  (cerebrovascular accident) ([place]) 06/15/2018   Depression 10/16/2017   Migraine 10/16/2017   Hyperglycemia, unspecified 07/10/2017   Hyperglycemia 07/10/2017   H/O dizziness 08/02/2016   Cervical disc disorder with radiculopathy of cervical region 04/12/2016   Nonallopathic lesion of thoracic region 04/12/2016   Nonallopathic lesion  of rib cage 04/12/2016   Facet arthritis of cervical region 03/21/2016   Hyperlipidemia 01/26/2015   Labral tear of shoulder, right, subsequent encounter 12/04/2014   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 [place], [place], [address] Phone: [phone]   Fax:  [phone]  Name: Lori Morales MRN: [id number] Date of Birth: 05-12-1961

## 2021-04-08 ENCOUNTER — Other Ambulatory Visit: Payer: Self-pay

## 2021-04-08 ENCOUNTER — Ambulatory Visit: Payer: 59

## 2021-04-08 DIAGNOSIS — M6281 Muscle weakness (generalized): Secondary | ICD-10-CM

## 2021-04-08 DIAGNOSIS — M542 Cervicalgia: Secondary | ICD-10-CM | POA: Diagnosis not present

## 2021-04-08 DIAGNOSIS — R293 Abnormal posture: Secondary | ICD-10-CM | POA: Diagnosis not present

## 2021-04-08 NOTE — Therapy (Signed)
[place] MAIN [facility] SERVICES [address] [place], [place], [address] Phone: [phone]   Fax:  [phone]  Physical Therapy Treatment  Patient Details  Name: Lori Morales MRN: [id number] Date of Birth: 08-08-1960 Referring Provider (PT): Tama High III MD   Encounter Date: 04/08/2021   PT End of Session - 04/08/21 0816     Visit Number 7    Number of Visits 16    Date for PT Re-Evaluation 04/29/21    Authorization Type 7/10 eval 10/6    PT Start Time 0718    PT Stop Time 0759    PT Time Calculation (min) 41 min    Activity Tolerance Patient tolerated treatment well    Behavior During Therapy [facility] for tasks assessed/performed             Past Medical History:  Diagnosis Date   Arthritis    CAD (coronary artery disease), native coronary artery    mild non calcified plaque in mid LAD and prox LCx on coronary CTA 01/2019   Depression    GERD (gastroesophageal reflux disease)    Headache    migraines   Hx of squamous cell carcinoma 07/11/2016   Right distal dorsum lat forearm    Hypertension    IBS (irritable bowel syndrome)    Palpitations    PFO (patent foramen ovale)    small by cardiac CTA 01/2019   PONV (postoperative nausea and vomiting)    Restless legs syndrome    Squamous cell carcinoma of skin 12/202/2017   right distal dorsum lateral forearm    Past Surgical History:  Procedure Laterality Date   ABDOMINOPLASTY     BACK SURGERY     1 laminectomy, 1 fusion   COLONOSCOPY     COLONOSCOPY WITH PROPOFOL N/A 01/17/2017   Procedure: COLONOSCOPY WITH PROPOFOL;  Surgeon: Lollie Sails, MD;  Location: [facility] ENDOSCOPY;  Service: Endoscopy;  Laterality: N/A;   WISDOM TOOTH EXTRACTION      There were no vitals filed for this visit.   Subjective Assessment - 04/08/21 0815     Subjective Patient reports the radiating pain has resolved after last session. Has been compliant with her nerve glides.    Pertinent  History Patient is returning back to physical therapy for her neck. Patient has a history of C6-7 arthroplasty and has been treated by this clinician before. PMH includes anxiety, CAD, depression, arthritis of cervical region, gastritis, GERD, cancer, stroke, HLD, HTN, IB, lumbar disc disease (fusion x2 ), migraine, numbness of L hand, headaches, and restless leg syndrome. R sided neck pain with L sided tightness, but no radiating to the arm.    Limitations Lifting;Writing;House hold activities;Other (comment);Reading;Sitting    How long can you sit comfortably? Increased pain if performing deskwork for > 1/2 day.    How long can you stand comfortably? n/a    How long can you walk comfortably? n/a    Diagnostic tests MRI prior to surgery    Patient Stated Goals To be painfree and work without pain or restrictions as well as sleep better.    Currently in Pain? No/denies                    Treatment:     supine: use of heat pad under upper thoracic lower cervical region  Mobilization to C3-4 with movement: patient extending cervical spine over PT fingers 12x 5 second holds Cervical sidebend with gentle overpressure 4x60  second holds each direction  cervical rotation with gentle overpressure 4x 60 second holds each direction Suboccipital release 3x30 second holds  Median nerve glide 10x LUE, 10x RUE  Ulnar nerve glide: 10x LUE 10x RUE   AP RUE shoulder grade II mobilizations for pain reduction 8x 15 seconds Distraction RUE 10x 10 second holds RTB Y overhead raises 15x RTB ER with cues for elbows to side 15x                 Pt educated throughout session about proper posture and technique with exercises. Improved exercise technique, movement at target joints, use of target muscles after min to mod verbal, visual, tactile cues.     Trigger Point Dry Needling (TDN), unbilled Education performed with patient regarding potential benefit of TDN. Reviewed precautions and risks with  patient. Reviewed special precautions/risks over lung fields which include pneumothorax. Reviewed signs and symptoms of pneumothorax and advised pt to go to ER immediately if these symptoms develop advise them of dry needling treatment. Extensive time spent with pt to ensure full understanding of TDN risks. Pt provided verbal consent to treatment. TDN performed to  with 0.25 x .40 single needle placements with local twitch response (LTR) to bilateral upper traps. Pistoning technique utilized. Improved pain-free motion following intervention. x8 minutes       Patient has decreased tension in upper trap musculature this session allowing for mor focused trigger point treatment. Postural strengthening tolerated well as well as nerve glides with no radiating pain reported by end of session. She will benefit from continued skilled PT services to progress her ROM, pain relief and assist in returning patient to her previous level of function in home and at work.             PT Education - 04/08/21 0816     Education Details exercise technique, body mechanics, nerve glides    Person(s) Educated Patient    Methods Explanation;Demonstration;Tactile cues;Verbal cues    Comprehension Verbalized understanding;Returned demonstration;Verbal cues required;Tactile cues required              PT Short Term Goals - 03/04/21 1716       PT SHORT TERM GOAL #1   Title Pt will be independent with HEP in order to improve strength and decrease neck pain in order to improve pain-free function at home and work.    Baseline 10/6: will provide next session    Time 4    Period Weeks    Status New    Target Date 04/01/21               PT Long Term Goals - 03/04/21 1717       PT LONG TERM GOAL #1   Title Patient will increase FOTO score to equal to or greater than  61/100   to demonstrate statistically significant improvement in mobility and quality of life.    Baseline 10/6: 48%    Time 8     Period Weeks    Status New    Target Date 04/29/21      PT LONG TERM GOAL #2   Title Pt will demonstrate decrease in NDI by at least 19% in order to demonstrate clinically significant reduction in disability related to neck injury/pain    Baseline 10/6: will perform next session    Time 8    Period Weeks    Status New    Target Date 04/29/21      PT LONG TERM  GOAL #3   Title Pt will decrease worst neck pain as reported on NPRS by at least 2 points (2/10) in order to demonstrate clinically significant reduction in neck pain.    Baseline 10/6: 4/10    Time 8    Period Weeks    Status New    Target Date 04/29/21      PT LONG TERM GOAL #4   Title Patient will demonstrate improved Cervical ROM by 50% in order to improve her functional ability to turn her head or nod for return of functional capabilities including driving, working, and sleeping.    Baseline 10/6: see note    Time 8    Period Weeks    Status New    Target Date 04/29/21                   Plan - 04/08/21 0954     Clinical Impression Statement Patient has decreased tension in upper trap musculature this session allowing for mor focused trigger point treatment. Postural strengthening tolerated well as well as nerve glides with no radiating pain reported by end of session. She will benefit from continued skilled PT services to progress her ROM, pain relief and assist in returning patient to her previous level of function in home and at work.    Personal Factors and Comorbidities Age;Comorbidity 3+;Past/Current Experience;Profession;Time since onset of injury/illness/exacerbation    Comorbidities anxiety, CAD, depression, arthritis of cervical region, gastritis, GERD, cancer, stroke, HLD, HTN, IB, lumbar disc disease (fusion x2 ), migraine, numbness of L hand, headaches, and restless leg syndrome    Examination-Activity Limitations Bathing;Bed Mobility;Caring for Others;Carry;Dressing;Lift;Reach Overhead;Sleep     Examination-Participation Restrictions Cleaning;Community Activity;Driving;Laundry;Occupation;Yard Work;Meal Prep    Stability/Clinical Decision Making Evolving/Moderate complexity    Rehab Potential Good    PT Frequency 2x / week    PT Duration 8 weeks    PT Treatment/Interventions ADLs/Self Care Home Management;Cryotherapy;Electrical Stimulation;Moist Heat;Functional mobility training;Therapeutic activities;Therapeutic exercise;Patient/family education;Manual techniques;Passive range of motion;Dry needling;Taping;Spinal Manipulations;Joint Manipulations;Traction;Ultrasound;Gait training;Stair training;Energy conservation;Visual/perceptual remediation/compensation    PT Next Visit Plan manual and therex for cervical ROM pain reduction    PT Home Exercise Plan 04/02/2021= Access Code: [email or web address]    Consulted and Agree with Plan of Care Patient             Patient will benefit from skilled therapeutic intervention in order to improve the following deficits and impairments:  Decreased activity tolerance, Decreased endurance, Decreased mobility, Decreased range of motion, Decreased strength, Hypomobility, Impaired flexibility, Increased muscle spasms, Impaired UE functional use, Postural dysfunction, Improper body mechanics, Pain  Visit Diagnosis: Cervicalgia  Muscle weakness (generalized)  Abnormal posture     Problem List Patient Active Problem List   Diagnosis Date Noted   PFO (patent foramen ovale)    CAD (coronary artery disease), native coronary artery    Acute CVA (cerebrovascular accident) ([place]) 06/15/2018   Depression 10/16/2017   Migraine 10/16/2017   Hyperglycemia, unspecified 07/10/2017   Hyperglycemia 07/10/2017   H/O dizziness 08/02/2016   Cervical disc disorder with radiculopathy of cervical region 04/12/2016   Nonallopathic lesion of thoracic region 04/12/2016   Nonallopathic lesion of rib cage 04/12/2016   Facet arthritis of cervical region 03/21/2016    Hyperlipidemia 01/26/2015   Labral tear of shoulder, right, subsequent encounter 12/04/2014   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

## 2021-04-12 ENCOUNTER — Ambulatory Visit: Payer: 59

## 2021-04-12 ENCOUNTER — Other Ambulatory Visit: Payer: Self-pay

## 2021-04-12 DIAGNOSIS — R293 Abnormal posture: Secondary | ICD-10-CM | POA: Diagnosis not present

## 2021-04-12 DIAGNOSIS — M542 Cervicalgia: Secondary | ICD-10-CM | POA: Diagnosis not present

## 2021-04-12 DIAGNOSIS — M6281 Muscle weakness (generalized): Secondary | ICD-10-CM

## 2021-04-12 NOTE — Therapy (Signed)
[place] MAIN [facility] SERVICES [address] [place], [place], [address] Phone: [phone]   Fax:  [phone]  Physical Therapy Treatment  Patient Details  Name: Lori Morales MRN: [id number] Date of Birth: 12/04/1960 Referring Provider (PT): Tama High III MD   Encounter Date: 04/12/2021   PT End of Session - 04/12/21 0912     Visit Number 8    Number of Visits 16    Date for PT Re-Evaluation 04/29/21    Authorization Type 8/10 eval 10/6    PT Start Time 0715    PT Stop Time 0758    PT Time Calculation (min) 43 min    Activity Tolerance Patient tolerated treatment well    Behavior During Therapy [facility] for tasks assessed/performed             Past Medical History:  Diagnosis Date   Arthritis    CAD (coronary artery disease), native coronary artery    mild non calcified plaque in mid LAD and prox LCx on coronary CTA 01/2019   Depression    GERD (gastroesophageal reflux disease)    Headache    migraines   Hx of squamous cell carcinoma 07/11/2016   Right distal dorsum lat forearm    Hypertension    IBS (irritable bowel syndrome)    Palpitations    PFO (patent foramen ovale)    small by cardiac CTA 01/2019   PONV (postoperative nausea and vomiting)    Restless legs syndrome    Squamous cell carcinoma of skin 12/202/2017   right distal dorsum lateral forearm    Past Surgical History:  Procedure Laterality Date   ABDOMINOPLASTY     BACK SURGERY     1 laminectomy, 1 fusion   COLONOSCOPY     COLONOSCOPY WITH PROPOFOL N/A 01/17/2017   Procedure: COLONOSCOPY WITH PROPOFOL;  Surgeon: Lollie Sails, MD;  Location: [facility] ENDOSCOPY;  Service: Endoscopy;  Laterality: N/A;   WISDOM TOOTH EXTRACTION      There were no vitals filed for this visit.   Subjective Assessment - 04/12/21 0910     Subjective Patient reports symptom exacerbation saturday but improved sunday. Has been compliant with her HEP.    Pertinent History  Patient is returning back to physical therapy for her neck. Patient has a history of C6-7 arthroplasty and has been treated by this clinician before. PMH includes anxiety, CAD, depression, arthritis of cervical region, gastritis, GERD, cancer, stroke, HLD, HTN, IB, lumbar disc disease (fusion x2 ), migraine, numbness of L hand, headaches, and restless leg syndrome. R sided neck pain with L sided tightness, but no radiating to the arm.    Limitations Lifting;Writing;House hold activities;Other (comment);Reading;Sitting    How long can you sit comfortably? Increased pain if performing deskwork for > 1/2 day.    How long can you stand comfortably? n/a    How long can you walk comfortably? n/a    Diagnostic tests MRI prior to surgery    Patient Stated Goals To be painfree and work without pain or restrictions as well as sleep better.    Currently in Pain? Yes    Pain Score 2     Pain Location Neck    Pain Orientation Right;Left;Posterior    Pain Descriptors / Indicators Aching    Pain Type Chronic pain    Pain Onset More than a month ago    Pain Frequency Intermittent  Treatment:    Mobilization to C3-4 with movement: patient extending cervical spine over PT fingers 12x 5 second holds Grade II CPA and UPA thoracic and cervical spine 2x 10 seconds each level J mobilization 2x30 second holds  Cervical sidebend with gentle overpressure 4x60 second holds each direction  cervical rotation with gentle overpressure 4x 60 second holds each direction Suboccipital release 3x30 second holds   Seated: Towel SNAG extension 10x each side Towel SNAG side bend 10x each side Trunk extension over half foam roller 12x Large swiss ball forward trunk rolls 10x 3 second holds                 Pt educated throughout session about proper posture and technique with exercises. Improved exercise technique, movement at target joints, use of target muscles after min to mod verbal, visual,  tactile cues.     Trigger Point Dry Needling (TDN), unbilled Education performed with patient regarding potential benefit of TDN. Reviewed precautions and risks with patient. Reviewed special precautions/risks over lung fields which include pneumothorax. Reviewed signs and symptoms of pneumothorax and advised pt to go to ER immediately if these symptoms develop advise them of dry needling treatment. Extensive time spent with pt to ensure full understanding of TDN risks. Pt provided verbal consent to treatment. TDN performed to  with 0.25 x .40 single needle placements with local twitch response (LTR) to bilateral upper traps. Pistoning technique utilized. Improved pain-free motion following intervention. x7 minutes        Patient presents with increased tension of bilateral upper traps with R >L. Patient tolerates manual, TDN, and therex well this session Patient reports relief of symptoms by end of session. Mobilizations are initially hypomobile but improve with repeated motion. She will benefit from continued skilled PT services to progress her ROM, pain relief and assist in returning patient to her previous level of function in home and at work.                PT Education - 04/12/21 0911     Education Details exercises technique, body mechanics    Person(s) Educated Patient    Methods Explanation;Demonstration;Tactile cues;Verbal cues    Comprehension Verbalized understanding;Returned demonstration;Verbal cues required;Tactile cues required              PT Short Term Goals - 03/04/21 1716       PT SHORT TERM GOAL #1   Title Pt will be independent with HEP in order to improve strength and decrease neck pain in order to improve pain-free function at home and work.    Baseline 10/6: will provide next session    Time 4    Period Weeks    Status New    Target Date 04/01/21               PT Long Term Goals - 03/04/21 1717       PT LONG TERM GOAL #1   Title  Patient will increase FOTO score to equal to or greater than  61/100   to demonstrate statistically significant improvement in mobility and quality of life.    Baseline 10/6: 48%    Time 8    Period Weeks    Status New    Target Date 04/29/21      PT LONG TERM GOAL #2   Title Pt will demonstrate decrease in NDI by at least 19% in order to demonstrate clinically significant reduction in disability related to neck injury/pain  Baseline 10/6: will perform next session    Time 8    Period Weeks    Status New    Target Date 04/29/21      PT LONG TERM GOAL #3   Title Pt will decrease worst neck pain as reported on NPRS by at least 2 points (2/10) in order to demonstrate clinically significant reduction in neck pain.    Baseline 10/6: 4/10    Time 8    Period Weeks    Status New    Target Date 04/29/21      PT LONG TERM GOAL #4   Title Patient will demonstrate improved Cervical ROM by 50% in order to improve her functional ability to turn her head or nod for return of functional capabilities including driving, working, and sleeping.    Baseline 10/6: see note    Time 8    Period Weeks    Status New    Target Date 04/29/21                   Plan - 04/12/21 3810     Clinical Impression Statement Patient presents with increased tension of bilateral upper traps with R >L. Patient tolerates manual, TDN, and therex well this session Patient reports relief of symptoms by end of session. Mobilizations are initially hypomobile but improve with repeated motion. She will benefit from continued skilled PT services to progress her ROM, pain relief and assist in returning patient to her previous level of function in home and at work.    Personal Factors and Comorbidities Age;Comorbidity 3+;Past/Current Experience;Profession;Time since onset of injury/illness/exacerbation    Comorbidities anxiety, CAD, depression, arthritis of cervical region, gastritis, GERD, cancer, stroke, HLD, HTN, IB,  lumbar disc disease (fusion x2 ), migraine, numbness of L hand, headaches, and restless leg syndrome    Examination-Activity Limitations Bathing;Bed Mobility;Caring for Others;Carry;Dressing;Lift;Reach Overhead;Sleep    Examination-Participation Restrictions Cleaning;Community Activity;Driving;Laundry;Occupation;Yard Work;Meal Prep    Stability/Clinical Decision Making Evolving/Moderate complexity    Rehab Potential Good    PT Frequency 2x / week    PT Duration 8 weeks    PT Treatment/Interventions ADLs/Self Care Home Management;Cryotherapy;Electrical Stimulation;Moist Heat;Functional mobility training;Therapeutic activities;Therapeutic exercise;Patient/family education;Manual techniques;Passive range of motion;Dry needling;Taping;Spinal Manipulations;Joint Manipulations;Traction;Ultrasound;Gait training;Stair training;Energy conservation;Visual/perceptual remediation/compensation    PT Next Visit Plan manual and therex for cervical ROM pain reduction    PT Home Exercise Plan 04/02/2021= Access Code: [email or web address]    Consulted and Agree with Plan of Care Patient             Patient will benefit from skilled therapeutic intervention in order to improve the following deficits and impairments:  Decreased activity tolerance, Decreased endurance, Decreased mobility, Decreased range of motion, Decreased strength, Hypomobility, Impaired flexibility, Increased muscle spasms, Impaired UE functional use, Postural dysfunction, Improper body mechanics, Pain  Visit Diagnosis: Cervicalgia  Muscle weakness (generalized)  Abnormal posture     Problem List Patient Active Problem List   Diagnosis Date Noted   PFO (patent foramen ovale)    CAD (coronary artery disease), native coronary artery    Acute CVA (cerebrovascular accident) ([place]) 06/15/2018   Depression 10/16/2017   Migraine 10/16/2017   Hyperglycemia, unspecified 07/10/2017   Hyperglycemia 07/10/2017   H/O dizziness 08/02/2016   Cervical  disc disorder with radiculopathy of cervical region 04/12/2016   Nonallopathic lesion of thoracic region 04/12/2016   Nonallopathic lesion of rib cage 04/12/2016   Facet arthritis of cervical region 03/21/2016   Hyperlipidemia 01/26/2015   Labral tear of  shoulder, right, subsequent encounter 12/04/2014   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 [place], [place], [address] Phone: [phone]   Fax:  [phone]  Name: Lori Morales MRN: [id number] Date of Birth: 1960/10/12

## 2021-04-14 ENCOUNTER — Ambulatory Visit: Payer: 59

## 2021-04-15 ENCOUNTER — Other Ambulatory Visit: Payer: Self-pay

## 2021-04-15 ENCOUNTER — Ambulatory Visit: Payer: 59

## 2021-04-15 DIAGNOSIS — M542 Cervicalgia: Secondary | ICD-10-CM

## 2021-04-15 DIAGNOSIS — M6281 Muscle weakness (generalized): Secondary | ICD-10-CM | POA: Diagnosis not present

## 2021-04-15 DIAGNOSIS — R293 Abnormal posture: Secondary | ICD-10-CM | POA: Diagnosis not present

## 2021-04-15 NOTE — Therapy (Signed)
[place] MAIN [facility] SERVICES [address] [place], [place], [address] Phone: [phone]   Fax:  [phone]  Physical Therapy Treatment  Patient Details  Name: Lori Morales MRN: [id number] Date of Birth: November 05, 1960 Referring Provider (PT): Tama High III MD   Encounter Date: 04/15/2021   PT End of Session - 04/15/21 0747     Visit Number 9    Number of Visits 16    Date for PT Re-Evaluation 04/29/21    Authorization Type 9/10 eval 10/6    PT Start Time 0714    PT Stop Time 0758    PT Time Calculation (min) 44 min    Activity Tolerance Patient tolerated treatment well    Behavior During Therapy [facility] for tasks assessed/performed             Past Medical History:  Diagnosis Date   Arthritis    CAD (coronary artery disease), native coronary artery    mild non calcified plaque in mid LAD and prox LCx on coronary CTA 01/2019   Depression    GERD (gastroesophageal reflux disease)    Headache    migraines   Hx of squamous cell carcinoma 07/11/2016   Right distal dorsum lat forearm    Hypertension    IBS (irritable bowel syndrome)    Palpitations    PFO (patent foramen ovale)    small by cardiac CTA 01/2019   PONV (postoperative nausea and vomiting)    Restless legs syndrome    Squamous cell carcinoma of skin 12/202/2017   right distal dorsum lateral forearm    Past Surgical History:  Procedure Laterality Date   ABDOMINOPLASTY     BACK SURGERY     1 laminectomy, 1 fusion   COLONOSCOPY     COLONOSCOPY WITH PROPOFOL N/A 01/17/2017   Procedure: COLONOSCOPY WITH PROPOFOL;  Surgeon: Lollie Sails, MD;  Location: [facility] ENDOSCOPY;  Service: Endoscopy;  Laterality: N/A;   WISDOM TOOTH EXTRACTION      There were no vitals filed for this visit.   Subjective Assessment - 04/15/21 0745     Subjective Patient reports a migraine yesterday and stifness of cervical spine.    Pertinent History Patient is returning back to  physical therapy for her neck. Patient has a history of C6-7 arthroplasty and has been treated by this clinician before. PMH includes anxiety, CAD, depression, arthritis of cervical region, gastritis, GERD, cancer, stroke, HLD, HTN, IB, lumbar disc disease (fusion x2 ), migraine, numbness of L hand, headaches, and restless leg syndrome. R sided neck pain with L sided tightness, but no radiating to the arm.    Limitations Lifting;Writing;House hold activities;Other (comment);Reading;Sitting    How long can you sit comfortably? Increased pain if performing deskwork for > 1/2 day.    How long can you stand comfortably? n/a    How long can you walk comfortably? n/a    Diagnostic tests MRI prior to surgery    Patient Stated Goals To be painfree and work without pain or restrictions as well as sleep better.    Currently in Pain? No/denies                   Treatment:    Mobilization to C3-4 with movement: patient extending cervical spine over PT fingers 12x 5 second holds Grade II CPA and UPA thoracic and cervical spine 2x 10 seconds each level J mobilization 2x30 second holds  Cervical sidebend with gentle overpressure 4x60 second  holds each direction  cervical rotation with gentle overpressure 4x 60 second holds each direction Suboccipital release 3x30 second holds    TherEx: Prone reverse fly 12x Supine RTB ER 15x Supine RTB Y 15x Standing posture 10x 3 second against wall for cueing  Standing against wall overhead PVC pipe raise 10x              Seated trunk extensions over half foam roller 12x    Pt educated throughout session about proper posture and technique with exercises. Improved exercise technique, movement at target joints, use of target muscles after min to mod verbal, visual, tactile cues.     Trigger Point Dry Needling (TDN), unbilled Education performed with patient regarding potential benefit of TDN. Reviewed precautions and risks with patient. Reviewed special  precautions/risks over lung fields which include pneumothorax. Reviewed signs and symptoms of pneumothorax and advised pt to go to ER immediately if these symptoms develop advise them of dry needling treatment. Extensive time spent with pt to ensure full understanding of TDN risks. Pt provided verbal consent to treatment. TDN performed to  with 0.25 x .40 single needle placements with local twitch response (LTR) to bilateral upper traps. Pistoning technique utilized. Improved pain-free motion following intervention. x7 minutes   Patient demonstrates excellent motivation throughout physical therapy session. Her cervical stiffness is relieved by end of session. Postural strengthening tolerated well. Postural strengthening tolerated well with no increase in pain. She will benefit from continued skilled PT services to progress her ROM, pain relief and assist in returning patient to her previous level of function in home and at work.                      PT Education - 04/15/21 0747     Education Details exercise technique body Agricultural consultant) Educated Patient    Methods Explanation;Demonstration;Tactile cues;Verbal cues    Comprehension Verbalized understanding;Returned demonstration;Verbal cues required;Tactile cues required              PT Short Term Goals - 03/04/21 1716       PT SHORT TERM GOAL #1   Title Pt will be independent with HEP in order to improve strength and decrease neck pain in order to improve pain-free function at home and work.    Baseline 10/6: will provide next session    Time 4    Period Weeks    Status New    Target Date 04/01/21               PT Long Term Goals - 03/04/21 1717       PT LONG TERM GOAL #1   Title Patient will increase FOTO score to equal to or greater than  61/100   to demonstrate statistically significant improvement in mobility and quality of life.    Baseline 10/6: 48%    Time 8    Period Weeks    Status New     Target Date 04/29/21      PT LONG TERM GOAL #2   Title Pt will demonstrate decrease in NDI by at least 19% in order to demonstrate clinically significant reduction in disability related to neck injury/pain    Baseline 10/6: will perform next session    Time 8    Period Weeks    Status New    Target Date 04/29/21      PT LONG TERM GOAL #3   Title Pt will decrease worst neck pain  as reported on NPRS by at least 2 points (2/10) in order to demonstrate clinically significant reduction in neck pain.    Baseline 10/6: 4/10    Time 8    Period Weeks    Status New    Target Date 04/29/21      PT LONG TERM GOAL #4   Title Patient will demonstrate improved Cervical ROM by 50% in order to improve her functional ability to turn her head or nod for return of functional capabilities including driving, working, and sleeping.    Baseline 10/6: see note    Time 8    Period Weeks    Status New    Target Date 04/29/21                   Plan - 04/15/21 1962     Clinical Impression Statement Patient demonstrates excellent motivation throughout physical therapy session. Her cervical stiffness is relieved by end of session. Postural strengthening tolerated well. Postural strengthening tolerated well with no increase in pain. She will benefit from continued skilled PT services to progress her ROM, pain relief and assist in returning patient to her previous level of function in home and at work.    Personal Factors and Comorbidities Age;Comorbidity 3+;Past/Current Experience;Profession;Time since onset of injury/illness/exacerbation    Comorbidities anxiety, CAD, depression, arthritis of cervical region, gastritis, GERD, cancer, stroke, HLD, HTN, IB, lumbar disc disease (fusion x2 ), migraine, numbness of L hand, headaches, and restless leg syndrome    Examination-Activity Limitations Bathing;Bed Mobility;Caring for Others;Carry;Dressing;Lift;Reach Overhead;Sleep    Examination-Participation  Restrictions Cleaning;Community Activity;Driving;Laundry;Occupation;Yard Work;Meal Prep    Stability/Clinical Decision Making Evolving/Moderate complexity    Rehab Potential Good    PT Frequency 2x / week    PT Duration 8 weeks    PT Treatment/Interventions ADLs/Self Care Home Management;Cryotherapy;Electrical Stimulation;Moist Heat;Functional mobility training;Therapeutic activities;Therapeutic exercise;Patient/family education;Manual techniques;Passive range of motion;Dry needling;Taping;Spinal Manipulations;Joint Manipulations;Traction;Ultrasound;Gait training;Stair training;Energy conservation;Visual/perceptual remediation/compensation    PT Next Visit Plan manual and therex for cervical ROM pain reduction    PT Home Exercise Plan 04/02/2021= Access Code: [email or web address]    Consulted and Agree with Plan of Care Patient             Patient will benefit from skilled therapeutic intervention in order to improve the following deficits and impairments:  Decreased activity tolerance, Decreased endurance, Decreased mobility, Decreased range of motion, Decreased strength, Hypomobility, Impaired flexibility, Increased muscle spasms, Impaired UE functional use, Postural dysfunction, Improper body mechanics, Pain  Visit Diagnosis: Cervicalgia  Muscle weakness (generalized)  Abnormal posture     Problem List Patient Active Problem List   Diagnosis Date Noted   PFO (patent foramen ovale)    CAD (coronary artery disease), native coronary artery    Acute CVA (cerebrovascular accident) ([place]) 06/15/2018   Depression 10/16/2017   Migraine 10/16/2017   Hyperglycemia, unspecified 07/10/2017   Hyperglycemia 07/10/2017   H/O dizziness 08/02/2016   Cervical disc disorder with radiculopathy of cervical region 04/12/2016   Nonallopathic lesion of thoracic region 04/12/2016   Nonallopathic lesion of rib cage 04/12/2016   Facet arthritis of cervical region 03/21/2016   Hyperlipidemia 01/26/2015   Labral  tear of shoulder, right, subsequent encounter 12/04/2014   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 MAIN [facility] SERVICES [address] [place], [place], [address] Phone: [phone]   Fax:  [phone]  Name: ATALIE OROS MRN: [id number] Date of Birth: 1960/09/29

## 2021-04-19 ENCOUNTER — Other Ambulatory Visit: Payer: Self-pay

## 2021-04-19 ENCOUNTER — Other Ambulatory Visit: Payer: Self-pay | Admitting: Obstetrics and Gynecology

## 2021-04-19 ENCOUNTER — Ambulatory Visit: Payer: 59

## 2021-04-19 DIAGNOSIS — R293 Abnormal posture: Secondary | ICD-10-CM

## 2021-04-19 DIAGNOSIS — M6281 Muscle weakness (generalized): Secondary | ICD-10-CM | POA: Diagnosis not present

## 2021-04-19 DIAGNOSIS — M542 Cervicalgia: Secondary | ICD-10-CM | POA: Diagnosis not present

## 2021-04-19 DIAGNOSIS — Z1231 Encounter for screening mammogram for malignant neoplasm of breast: Secondary | ICD-10-CM

## 2021-04-19 MED ORDER — TIZANIDINE HCL 2 MG PO TABS
ORAL_TABLET | ORAL | 11 refills | Status: DC
  Filled 2021-04-19: qty 60, 20d supply, fill #0
  Filled 2021-05-18: qty 60, 20d supply, fill #1
  Filled 2021-06-15: qty 60, 20d supply, fill #2
  Filled 2022-01-05: qty 60, 20d supply, fill #3

## 2021-04-19 MED ORDER — CYCLOBENZAPRINE HCL 10 MG PO TABS
ORAL_TABLET | ORAL | 5 refills | Status: DC
  Filled 2021-04-19: qty 20, 20d supply, fill #0
  Filled 2021-05-06: qty 20, 20d supply, fill #1
  Filled 2021-06-15: qty 20, 20d supply, fill #2
  Filled 2022-01-05: qty 20, 20d supply, fill #3
  Filled 2022-04-06: qty 20, 20d supply, fill #4

## 2021-04-19 NOTE — Therapy (Signed)
[place] MAIN [facility] SERVICES [address] [place], [place], [address] Phone: [phone]   Fax:  [phone]  Physical Therapy Treatment Physical Therapy Progress Note   Dates of reporting period  03/04/21   to   04/19/21   Patient Details  Name: Lori Morales MRN: [id number] Date of Birth: [birth year] Referring Provider (PT): Tama High III MD   Encounter Date: 04/19/2021   PT End of Session - 04/19/21 0724     Visit Number 10    Number of Visits 16    Date for PT Re-Evaluation 04/29/21    Authorization Type 10/10 eval 10/6    PT Start Time 0717    PT Stop Time 0759    PT Time Calculation (min) 42 min    Activity Tolerance Patient tolerated treatment well    Behavior During Therapy [facility] for tasks assessed/performed             Past Medical History:  Diagnosis Date   Arthritis    CAD (coronary artery disease), native coronary artery    mild non calcified plaque in mid LAD and prox LCx on coronary CTA 01/2019   Depression    GERD (gastroesophageal reflux disease)    Headache    migraines   Hx of squamous cell carcinoma 07/11/2016   Right distal dorsum lat forearm    Hypertension    IBS (irritable bowel syndrome)    Palpitations    PFO (patent foramen ovale)    small by cardiac CTA 01/2019   PONV (postoperative nausea and vomiting)    Restless legs syndrome    Squamous cell carcinoma of skin 12/202/2017   right distal dorsum lateral forearm    Past Surgical History:  Procedure Laterality Date   ABDOMINOPLASTY     BACK SURGERY     1 laminectomy, 1 fusion   COLONOSCOPY     COLONOSCOPY WITH PROPOFOL N/A 01/17/2017   Procedure: COLONOSCOPY WITH PROPOFOL;  Surgeon: Lollie Sails, MD;  Location: [facility] ENDOSCOPY;  Service: Endoscopy;  Laterality: N/A;   WISDOM TOOTH EXTRACTION      There were no vitals filed for this visit.   Subjective Assessment - 04/19/21 0721     Subjective Patient had R sided pain/spasm  over the weekend that would not alleviate despite positioning in bed. Was an 8/10 this morning reports feeling like a muscle spasm    Pertinent History Patient is returning back to physical therapy for her neck. Patient has a history of C6-7 arthroplasty and has been treated by this clinician before. PMH includes anxiety, CAD, depression, arthritis of cervical region, gastritis, GERD, cancer, stroke, HLD, HTN, IB, lumbar disc disease (fusion x2 ), migraine, numbness of L hand, headaches, and restless leg syndrome. R sided neck pain with L sided tightness, but no radiating to the arm.    Limitations Lifting;Writing;House hold activities;Other (comment);Reading;Sitting    How long can you sit comfortably? Increased pain if performing deskwork for > 1/2 day.    How long can you stand comfortably? n/a    How long can you walk comfortably? n/a    Diagnostic tests MRI prior to surgery    Patient Stated Goals To be painfree and work without pain or restrictions as well as sleep better.    Currently in Pain? Yes    Pain Score 4     Pain Location Neck    Pain Orientation Right    Pain Descriptors / Indicators Aching  Pain Type Chronic pain               Patient had R sided pain/spasm over the weekend that would not alleviate despite positioning in bed. Was an 8/10 this morning   Progress note FOTO: 46%  NDI: 39%  VAS: 8/10 last night  Cervical ROM    Right Left  Flexion 32  Extension 35  Side Bending 20 21  Rotation 55 58   Manual:  Mobilization to C3-4 with movement: patient extending cervical spine over PT fingers 12x 5 second holds Grade II CPA and UPA thoracic and cervical spine 2x 10 seconds each level J mobilization 2x30 second holds  Cervical sidebend with gentle overpressure 4x60 second holds each direction  cervical rotation with gentle overpressure 4x 60 second holds each direction Suboccipital release 3x30 second holds    Trigger Point Dry Needling (TDN),  unbilled Education performed with patient regarding potential benefit of TDN. Reviewed precautions and risks with patient. Reviewed special precautions/risks over lung fields which include pneumothorax. Reviewed signs and symptoms of pneumothorax and advised pt to go to ER immediately if these symptoms develop advise them of dry needling treatment. Extensive time spent with pt to ensure full understanding of TDN risks. Pt provided verbal consent to treatment. TDN performed to  with 0.25 x .40 single needle placements with local twitch response (LTR) to bilateral upper traps. Pistoning technique utilized. Improved pain-free motion following intervention. x7 minutes   Patient's condition has the potential to improve in response to therapy. Maximum improvement is yet to be obtained. The anticipated improvement is attainable and reasonable in a generally predictable time.  Patient reports she thinks PT has helped with the exception of this weekends muscle spasm    Patient's goals severely limited by new onset of muscle spasm over the weekend. In general patient reports decreased pain and decreased frequency of pain episodes. Patient's condition has the potential to improve in response to therapy. Maximum improvement is yet to be obtained. The anticipated improvement is attainable and reasonable in a generally predictable time.She will benefit from continued skilled PT services to progress her ROM, pain relief and assist in returning patient to her previous level of function in home and at work                 PT Education - 04/19/21 0723     Education Details pain reduction, body mechanics, progress note    Person(s) Educated Patient    Methods Explanation;Demonstration;Tactile cues;Verbal cues    Comprehension Verbalized understanding;Returned demonstration;Verbal cues required;Tactile cues required              PT Short Term Goals - 04/19/21 0726       PT SHORT TERM GOAL #1    Title Pt will be independent with HEP in order to improve strength and decrease neck pain in order to improve pain-free function at home and work.    Baseline 10/6: will provide next session 11/21; HEP compliant    Time 4    Period Weeks    Status Achieved    Target Date 04/01/21               PT Long Term Goals - 04/19/21 0727       PT LONG TERM GOAL #1   Title Patient will increase FOTO score to equal to or greater than  61/100   to demonstrate statistically significant improvement in mobility and quality of life.    Baseline 10/6:  48% 11/21: 46%    Time 8    Period Weeks    Status New    Target Date 04/29/21      PT LONG TERM GOAL #2   Title Pt will demonstrate decrease in NDI by at least 19% in order to demonstrate clinically significant reduction in disability related to neck injury/pain    Baseline 10/6: will perform next session 11/21: 39%    Time 8    Period Weeks    Status Partially Met    Target Date 04/29/21      PT LONG TERM GOAL #3   Title Pt will decrease worst neck pain as reported on NPRS by at least 2 points (2/10) in order to demonstrate clinically significant reduction in neck pain.    Baseline 10/6: 4/10 11/21: 8/10 muscle spasm    Time 8    Period Weeks    Status On-going    Target Date 04/29/21      PT LONG TERM GOAL #4   Title Patient will demonstrate improved Cervical ROM by 50% in order to improve her functional ability to turn her head or nod for return of functional capabilities including driving, working, and sleeping.    Baseline 10/6: see note 11/21: see note    Time 8    Period Weeks    Status Partially Met    Target Date 04/29/21                   Plan - 04/19/21 0915     Clinical Impression Statement Patient's goals severely limited by new onset of muscle spasm over the weekend. In general patient reports decreased pain and decreased frequency of pain episodes. Patient's condition has the potential to improve in response  to therapy. Maximum improvement is yet to be obtained. The anticipated improvement is attainable and reasonable in a generally predictable time.She will benefit from continued skilled PT services to progress her ROM, pain relief and assist in returning patient to her previous level of function in home and at work    Personal Factors and Comorbidities Age;Comorbidity 3+;Past/Current Experience;Profession;Time since onset of injury/illness/exacerbation    Comorbidities anxiety, CAD, depression, arthritis of cervical region, gastritis, GERD, cancer, stroke, HLD, HTN, IB, lumbar disc disease (fusion x2 ), migraine, numbness of L hand, headaches, and restless leg syndrome    Examination-Activity Limitations Bathing;Bed Mobility;Caring for Others;Carry;Dressing;Lift;Reach Overhead;Sleep    Examination-Participation Restrictions Cleaning;Community Activity;Driving;Laundry;Occupation;Yard Work;Meal Prep    Stability/Clinical Decision Making Evolving/Moderate complexity    Rehab Potential Good    PT Frequency 2x / week    PT Duration 8 weeks    PT Treatment/Interventions ADLs/Self Care Home Management;Cryotherapy;Electrical Stimulation;Moist Heat;Functional mobility training;Therapeutic activities;Therapeutic exercise;Patient/family education;Manual techniques;Passive range of motion;Dry needling;Taping;Spinal Manipulations;Joint Manipulations;Traction;Ultrasound;Gait training;Stair training;Energy conservation;Visual/perceptual remediation/compensation    PT Next Visit Plan manual and therex for cervical ROM pain reduction    PT Home Exercise Plan 04/02/2021= Access Code: [email or web address]    Consulted and Agree with Plan of Care Patient             Patient will benefit from skilled therapeutic intervention in order to improve the following deficits and impairments:  Decreased activity tolerance, Decreased endurance, Decreased mobility, Decreased range of motion, Decreased strength, Hypomobility, Impaired  flexibility, Increased muscle spasms, Impaired UE functional use, Postural dysfunction, Improper body mechanics, Pain  Visit Diagnosis: Cervicalgia  Muscle weakness (generalized)  Abnormal posture     Problem List Patient Active Problem List   Diagnosis Date Noted  PFO (patent foramen ovale)    CAD (coronary artery disease), native coronary artery    Acute CVA (cerebrovascular accident) ([place]) 06/15/2018   Depression 10/16/2017   Migraine 10/16/2017   Hyperglycemia, unspecified 07/10/2017   Hyperglycemia 07/10/2017   H/O dizziness 08/02/2016   Cervical disc disorder with radiculopathy of cervical region 04/12/2016   Nonallopathic lesion of thoracic region 04/12/2016   Nonallopathic lesion of rib cage 04/12/2016   Facet arthritis of cervical region 03/21/2016   Hyperlipidemia 01/26/2015   Labral tear of shoulder, right, subsequent encounter 12/04/2014   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 [place], [place], [address] Phone: [phone]   Fax:  [phone]  Name: Lori Morales MRN: [id number] Date of Birth: Nov 01, 1960

## 2021-04-20 ENCOUNTER — Other Ambulatory Visit: Payer: Self-pay

## 2021-04-20 MED ORDER — PREDNISONE 10 MG PO TABS
ORAL_TABLET | ORAL | 0 refills | Status: DC
  Filled 2021-04-20: qty 20, 8d supply, fill #0

## 2021-04-21 ENCOUNTER — Ambulatory Visit: Payer: 59

## 2021-04-21 ENCOUNTER — Other Ambulatory Visit: Payer: Self-pay

## 2021-04-21 DIAGNOSIS — R293 Abnormal posture: Secondary | ICD-10-CM | POA: Diagnosis not present

## 2021-04-21 DIAGNOSIS — M542 Cervicalgia: Secondary | ICD-10-CM | POA: Diagnosis not present

## 2021-04-21 DIAGNOSIS — M6281 Muscle weakness (generalized): Secondary | ICD-10-CM

## 2021-04-21 NOTE — Therapy (Signed)
[place] MAIN [facility] SERVICES [address] [place], [place], [address] Phone: [phone]   Fax:  [phone]  Physical Therapy Treatment  Patient Details  Name: Lori Morales MRN: [id number] Date of Birth: [birth year] Referring Provider (PT): Tama High III MD   Encounter Date: 04/21/2021   PT End of Session - 04/21/21 0924     Visit Number 11    Number of Visits 16    Date for PT Re-Evaluation 04/29/21    Authorization Type 1/10 PN performed 11/21    PT Start Time 0801    PT Stop Time 0844    PT Time Calculation (min) 43 min    Activity Tolerance Patient tolerated treatment well    Behavior During Therapy [facility] for tasks assessed/performed             Past Medical History:  Diagnosis Date   Arthritis    CAD (coronary artery disease), native coronary artery    mild non calcified plaque in mid LAD and prox LCx on coronary CTA 01/2019   Depression    GERD (gastroesophageal reflux disease)    Headache    migraines   Hx of squamous cell carcinoma 07/11/2016   Right distal dorsum lat forearm    Hypertension    IBS (irritable bowel syndrome)    Palpitations    PFO (patent foramen ovale)    small by cardiac CTA 01/2019   PONV (postoperative nausea and vomiting)    Restless legs syndrome    Squamous cell carcinoma of skin 12/202/2017   right distal dorsum lateral forearm    Past Surgical History:  Procedure Laterality Date   ABDOMINOPLASTY     BACK SURGERY     1 laminectomy, 1 fusion   COLONOSCOPY     COLONOSCOPY WITH PROPOFOL N/A 01/17/2017   Procedure: COLONOSCOPY WITH PROPOFOL;  Surgeon: Lollie Sails, MD;  Location: [facility] ENDOSCOPY;  Service: Endoscopy;  Laterality: N/A;   WISDOM TOOTH EXTRACTION      There were no vitals filed for this visit.   Subjective Assessment - 04/21/21 0923     Subjective Patient reports past two days have been having severe spasms. Is now on prednisone, cant be seen by physician  until january.    Pertinent History Patient is returning back to physical therapy for her neck. Patient has a history of C6-7 arthroplasty and has been treated by this clinician before. PMH includes anxiety, CAD, depression, arthritis of cervical region, gastritis, GERD, cancer, stroke, HLD, HTN, IB, lumbar disc disease (fusion x2 ), migraine, numbness of L hand, headaches, and restless leg syndrome. R sided neck pain with L sided tightness, but no radiating to the arm.    Limitations Lifting;Writing;House hold activities;Other (comment);Reading;Sitting    How long can you sit comfortably? Increased pain if performing deskwork for > 1/2 day.    How long can you stand comfortably? n/a    How long can you walk comfortably? n/a    Diagnostic tests MRI prior to surgery    Patient Stated Goals To be painfree and work without pain or restrictions as well as sleep better.    Currently in Pain? Yes    Pain Score 4     Pain Location Neck    Pain Orientation Right;Left    Pain Descriptors / Indicators Aching    Pain Type Chronic pain    Pain Frequency Intermittent  Treatment:    Mobilization to C3-4 with movement: patient extending cervical spine over PT fingers 12x 5 second holds Grade II CPA and UPA thoracic and cervical spine 2x 10 seconds each level J mobilization 2x30 second holds  Cervical sidebend with gentle overpressure 4x60 second holds each direction  cervical rotation with gentle overpressure 4x 60 second holds each direction Suboccipital release 3x30 second holds   Median nerve glide 12x each LE STM with implementation of effleurage and ptrissage to cervical paraspinals and upper trap x 8 minutes  GH AP mobilization 10x 5 second mobilizations Prone reverse fly 12x    Pt educated throughout session about proper posture and technique with exercises. Improved exercise technique, movement at target joints, use of target muscles after min to mod verbal,  visual, tactile cues.     Trigger Point Dry Needling (TDN), unbilled Education performed with patient regarding potential benefit of TDN. Reviewed precautions and risks with patient. Reviewed special precautions/risks over lung fields which include pneumothorax. Reviewed signs and symptoms of pneumothorax and advised pt to go to ER immediately if these symptoms develop advise them of dry needling treatment. Extensive time spent with pt to ensure full understanding of TDN risks. Pt provided verbal consent to treatment. TDN performed to  with 0.25 x .40 single needle placements with local twitch response (LTR) to bilateral upper traps, cervical paraspinals. Pistoning technique utilized. Improved pain-free motion following intervention. x8 minutes     Patient presents with increased muscle spasms requiring intensive manual technique for pain reduction and decreased spasm. Patient reports decreased pain by end of session.  High levels of muscle guarding noted throughout entire cervical spine and upper thoracic region. She will benefit from continued skilled PT services to progress her ROM, pain relief and assist in returning patient to her previous level of function in home and at work                  PT Education - 04/21/21 0924     Education Details manual, pain reduction    Person(s) Educated Patient    Methods Explanation;Demonstration;Tactile cues;Verbal cues    Comprehension Verbalized understanding;Returned demonstration;Verbal cues required;Tactile cues required              PT Short Term Goals - 04/19/21 0726       PT SHORT TERM GOAL #1   Title Pt will be independent with HEP in order to improve strength and decrease neck pain in order to improve pain-free function at home and work.    Baseline 10/6: will provide next session 11/21; HEP compliant    Time 4    Period Weeks    Status Achieved    Target Date 04/01/21               PT Long Term Goals -  04/19/21 0727       PT LONG TERM GOAL #1   Title Patient will increase FOTO score to equal to or greater than  61/100   to demonstrate statistically significant improvement in mobility and quality of life.    Baseline 10/6: 48% 11/21: 46%    Time 8    Period Weeks    Status New    Target Date 04/29/21      PT LONG TERM GOAL #2   Title Pt will demonstrate decrease in NDI by at least 19% in order to demonstrate clinically significant reduction in disability related to neck injury/pain    Baseline 10/6: will perform next  session 11/21: 39%    Time 8    Period Weeks    Status Partially Met    Target Date 04/29/21      PT LONG TERM GOAL #3   Title Pt will decrease worst neck pain as reported on NPRS by at least 2 points (2/10) in order to demonstrate clinically significant reduction in neck pain.    Baseline 10/6: 4/10 11/21: 8/10 muscle spasm    Time 8    Period Weeks    Status On-going    Target Date 04/29/21      PT LONG TERM GOAL #4   Title Patient will demonstrate improved Cervical ROM by 50% in order to improve her functional ability to turn her head or nod for return of functional capabilities including driving, working, and sleeping.    Baseline 10/6: see note 11/21: see note    Time 8    Period Weeks    Status Partially Met    Target Date 04/29/21                   Plan - 04/21/21 0925     Clinical Impression Statement Patient presents with increased muscle spasms requiring intensive manual technique for pain reduction and decreased spasm. Patient reports decreased pain by end of session.  High levels of muscle guarding noted throughout entire cervical spine and upper thoracic region. She will benefit from continued skilled PT services to progress her ROM, pain relief and assist in returning patient to her previous level of function in home and at work    Personal Factors and Comorbidities Age;Comorbidity 3+;Past/Current Experience;Profession;Time since onset of  injury/illness/exacerbation    Comorbidities anxiety, CAD, depression, arthritis of cervical region, gastritis, GERD, cancer, stroke, HLD, HTN, IB, lumbar disc disease (fusion x2 ), migraine, numbness of L hand, headaches, and restless leg syndrome    Examination-Activity Limitations Bathing;Bed Mobility;Caring for Others;Carry;Dressing;Lift;Reach Overhead;Sleep    Examination-Participation Restrictions Cleaning;Community Activity;Driving;Laundry;Occupation;Yard Work;Meal Prep    Stability/Clinical Decision Making Evolving/Moderate complexity    Rehab Potential Good    PT Frequency 2x / week    PT Duration 8 weeks    PT Treatment/Interventions ADLs/Self Care Home Management;Cryotherapy;Electrical Stimulation;Moist Heat;Functional mobility training;Therapeutic activities;Therapeutic exercise;Patient/family education;Manual techniques;Passive range of motion;Dry needling;Taping;Spinal Manipulations;Joint Manipulations;Traction;Ultrasound;Gait training;Stair training;Energy conservation;Visual/perceptual remediation/compensation    PT Next Visit Plan manual and therex for cervical ROM pain reduction    PT Home Exercise Plan 04/02/2021= Access Code: [email or web address]    Consulted and Agree with Plan of Care Patient             Patient will benefit from skilled therapeutic intervention in order to improve the following deficits and impairments:  Decreased activity tolerance, Decreased endurance, Decreased mobility, Decreased range of motion, Decreased strength, Hypomobility, Impaired flexibility, Increased muscle spasms, Impaired UE functional use, Postural dysfunction, Improper body mechanics, Pain  Visit Diagnosis: Cervicalgia  Muscle weakness (generalized)  Abnormal posture     Problem List Patient Active Problem List   Diagnosis Date Noted   PFO (patent foramen ovale)    CAD (coronary artery disease), native coronary artery    Acute CVA (cerebrovascular accident) ([place]) 06/15/2018    Depression 10/16/2017   Migraine 10/16/2017   Hyperglycemia, unspecified 07/10/2017   Hyperglycemia 07/10/2017   H/O dizziness 08/02/2016   Cervical disc disorder with radiculopathy of cervical region 04/12/2016   Nonallopathic lesion of thoracic region 04/12/2016   Nonallopathic lesion of rib cage 04/12/2016   Facet arthritis of cervical region 03/21/2016   Hyperlipidemia  01/26/2015   Labral tear of shoulder, right, subsequent encounter 12/04/2014   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 [place], [place], [address] Phone: [phone]   Fax:  [phone]  Name: Lori Morales MRN: [id number] Date of Birth: 1961-01-21

## 2021-04-26 ENCOUNTER — Ambulatory Visit: Payer: 59

## 2021-04-26 ENCOUNTER — Other Ambulatory Visit: Payer: Self-pay | Admitting: Physician Assistant

## 2021-04-26 ENCOUNTER — Other Ambulatory Visit: Payer: Self-pay

## 2021-04-26 ENCOUNTER — Ambulatory Visit
Admission: RE | Admit: 2021-04-26 | Discharge: 2021-04-26 | Disposition: A | Discharge status: Home / Self Care | Payer: 59 | Source: Ambulatory Visit | Attending: Physician Assistant | Admitting: Physician Assistant

## 2021-04-26 DIAGNOSIS — M542 Cervicalgia: Secondary | ICD-10-CM | POA: Insufficient documentation

## 2021-04-26 DIAGNOSIS — M6281 Muscle weakness (generalized): Secondary | ICD-10-CM

## 2021-04-26 DIAGNOSIS — R293 Abnormal posture: Secondary | ICD-10-CM

## 2021-04-26 DIAGNOSIS — M5412 Radiculopathy, cervical region: Secondary | ICD-10-CM | POA: Diagnosis not present

## 2021-04-26 MED ORDER — HYDROCODONE-ACETAMINOPHEN 5-325 MG PO TABS
ORAL_TABLET | ORAL | 0 refills | Status: DC
  Filled 2021-04-26: qty 20, 5d supply, fill #0

## 2021-04-26 NOTE — Therapy (Signed)
[place] MAIN [facility] SERVICES [address] [place], [place], [address] Phone: [phone]   Fax:  [phone]  Physical Therapy Treatment  Patient Details  Name: Lori Morales MRN: [id number] Date of Birth: Mar 28, 1961 Referring Provider (PT): Tama High III MD   Encounter Date: 04/26/2021   PT End of Session - 04/26/21 0854     Visit Number 12    Number of Visits 16    Date for PT Re-Evaluation 04/29/21    Authorization Type 2/10 PN performed 11/21    PT Start Time 0715    PT Stop Time 0759    PT Time Calculation (min) 44 min    Activity Tolerance Patient tolerated treatment well    Behavior During Therapy [facility] for tasks assessed/performed             Past Medical History:  Diagnosis Date   Arthritis    CAD (coronary artery disease), native coronary artery    mild non calcified plaque in mid LAD and prox LCx on coronary CTA 01/2019   Depression    GERD (gastroesophageal reflux disease)    Headache    migraines   Hx of squamous cell carcinoma 07/11/2016   Right distal dorsum lat forearm    Hypertension    IBS (irritable bowel syndrome)    Palpitations    PFO (patent foramen ovale)    small by cardiac CTA 01/2019   PONV (postoperative nausea and vomiting)    Restless legs syndrome    Squamous cell carcinoma of skin 12/202/2017   right distal dorsum lateral forearm    Past Surgical History:  Procedure Laterality Date   ABDOMINOPLASTY     BACK SURGERY     1 laminectomy, 1 fusion   COLONOSCOPY     COLONOSCOPY WITH PROPOFOL N/A 01/17/2017   Procedure: COLONOSCOPY WITH PROPOFOL;  Surgeon: Lollie Sails, MD;  Location: [facility] ENDOSCOPY;  Service: Endoscopy;  Laterality: N/A;   WISDOM TOOTH EXTRACTION      There were no vitals filed for this visit.   Subjective Assessment - 04/26/21 0852     Subjective Patient reports having a very bad weekend, can't sleep in bed, has to sleep in recliner. Getting cramps on R side  of neck and feels like a "golfball" at best is 6/10.    Pertinent History Patient is returning back to physical therapy for her neck. Patient has a history of C6-7 arthroplasty and has been treated by this clinician before. PMH includes anxiety, CAD, depression, arthritis of cervical region, gastritis, GERD, cancer, stroke, HLD, HTN, IB, lumbar disc disease (fusion x2 ), migraine, numbness of L hand, headaches, and restless leg syndrome. R sided neck pain with L sided tightness, but no radiating to the arm.    Limitations Lifting;Writing;House hold activities;Other (comment);Reading;Sitting    How long can you sit comfortably? Increased pain if performing deskwork for > 1/2 day.    How long can you stand comfortably? n/a    How long can you walk comfortably? n/a    Diagnostic tests MRI prior to surgery    Patient Stated Goals To be painfree and work without pain or restrictions as well as sleep better.    Currently in Pain? Yes    Pain Score 6     Pain Location Neck    Pain Orientation Right    Pain Descriptors / Indicators Aching    Pain Type Chronic pain    Pain Onset More than a  month ago    Pain Frequency Intermittent                  Patient reports having a very bad weekend, can't sleep in bed, has to sleep in recliner. Getting cramps on R side of neck and feels like a "golfball" at best is 6/10.     Treatment:    Mobilization to C3-4 with movement: patient extending cervical spine over PT fingers 12x 5 second holds Grade II CPA and UPA thoracic and cervical spine 2x 10 seconds each level J mobilization 2x30 second holds  Cervical sidebend with gentle overpressure 4x60 second holds each direction  cervical rotation with gentle overpressure 4x 60 second holds each direction Suboccipital release 3x30 second holds   Median nerve glide 12x each LE STM with implementation of effleurage and ptrissage to cervical paraspinals and upper trap x 8 minutes   Palpation of painful  region limited by pain and muscle guarding however bony abnormality noted at distal end of transverse process.     Pt educated throughout session about proper posture and technique with exercises. Improved exercise technique, movement at target joints, use of target muscles after min to mod verbal, visual, tactile cues.     Trigger Point Dry Needling (TDN), unbilled Education performed with patient regarding potential benefit of TDN. Reviewed precautions and risks with patient. Reviewed special precautions/risks over lung fields which include pneumothorax. Reviewed signs and symptoms of pneumothorax and advised pt to go to ER immediately if these symptoms develop advise them of dry needling treatment. Extensive time spent with pt to ensure full understanding of TDN risks. Pt provided verbal consent to treatment. TDN performed to  with 0.25 x .40 single needle placements with local twitch response (LTR) to bilateral upper traps, cervical paraspinals. Pistoning technique utilized. Improved pain-free motion following intervention. x14 minutes        Patient presents with high pain increase over the weekend that has limited all levels of function. She is agreeable to reach out to physician as pain is abnormal. This Pryor Curia has additionally reached out to physician. Upon palpation of painful region noticeable bony abnormality under distal aspect of transverse process. Patient does have some pain relief with CPA but pain increase with UPA. She will benefit from continued skilled PT services to progress her ROM, pain relief and assist in returning patient to her previous level of function in home and at work             PT Education - 04/26/21 0854     Education Details manual, reach out to physician    Person(s) Educated Patient    Methods Explanation;Demonstration;Tactile cues;Verbal cues    Comprehension Verbalized understanding;Verbal cues required;Returned demonstration;Tactile cues  required              PT Short Term Goals - 04/19/21 0726       PT SHORT TERM GOAL #1   Title Pt will be independent with HEP in order to improve strength and decrease neck pain in order to improve pain-free function at home and work.    Baseline 10/6: will provide next session 11/21; HEP compliant    Time 4    Period Weeks    Status Achieved    Target Date 04/01/21               PT Long Term Goals - 04/19/21 0727       PT LONG TERM GOAL #1   Title Patient will increase  FOTO score to equal to or greater than  61/100   to demonstrate statistically significant improvement in mobility and quality of life.    Baseline 10/6: 48% 11/21: 46%    Time 8    Period Weeks    Status New    Target Date 04/29/21      PT LONG TERM GOAL #2   Title Pt will demonstrate decrease in NDI by at least 19% in order to demonstrate clinically significant reduction in disability related to neck injury/pain    Baseline 10/6: will perform next session 11/21: 39%    Time 8    Period Weeks    Status Partially Met    Target Date 04/29/21      PT LONG TERM GOAL #3   Title Pt will decrease worst neck pain as reported on NPRS by at least 2 points (2/10) in order to demonstrate clinically significant reduction in neck pain.    Baseline 10/6: 4/10 11/21: 8/10 muscle spasm    Time 8    Period Weeks    Status On-going    Target Date 04/29/21      PT LONG TERM GOAL #4   Title Patient will demonstrate improved Cervical ROM by 50% in order to improve her functional ability to turn her head or nod for return of functional capabilities including driving, working, and sleeping.    Baseline 10/6: see note 11/21: see note    Time 8    Period Weeks    Status Partially Met    Target Date 04/29/21                   Plan - 04/26/21 0859     Clinical Impression Statement Patient presents with high pain increase over the weekend that has limited all levels of function. She is agreeable to reach  out to physician as pain is abnormal. This Pryor Curia has additionally reached out to physician. Upon palpation of painful region noticeable bony abnormality under distal aspect of transverse process. Patient does have some pain relief with CPA but pain increase with UPA. She will benefit from continued skilled PT services to progress her ROM, pain relief and assist in returning patient to her previous level of function in home and at work    Personal Factors and Comorbidities Age;Comorbidity 3+;Past/Current Experience;Profession;Time since onset of injury/illness/exacerbation    Comorbidities anxiety, CAD, depression, arthritis of cervical region, gastritis, GERD, cancer, stroke, HLD, HTN, IB, lumbar disc disease (fusion x2 ), migraine, numbness of L hand, headaches, and restless leg syndrome    Examination-Activity Limitations Bathing;Bed Mobility;Caring for Others;Carry;Dressing;Lift;Reach Overhead;Sleep    Examination-Participation Restrictions Cleaning;Community Activity;Driving;Laundry;Occupation;Yard Work;Meal Prep    Stability/Clinical Decision Making Evolving/Moderate complexity    Rehab Potential Good    PT Frequency 2x / week    PT Duration 8 weeks    PT Treatment/Interventions ADLs/Self Care Home Management;Cryotherapy;Electrical Stimulation;Moist Heat;Functional mobility training;Therapeutic activities;Therapeutic exercise;Patient/family education;Manual techniques;Passive range of motion;Dry needling;Taping;Spinal Manipulations;Joint Manipulations;Traction;Ultrasound;Gait training;Stair training;Energy conservation;Visual/perceptual remediation/compensation    PT Next Visit Plan manual and therex for cervical ROM pain reduction    PT Home Exercise Plan 04/02/2021= Access Code: [email or web address]    Consulted and Agree with Plan of Care Patient             Patient will benefit from skilled therapeutic intervention in order to improve the following deficits and impairments:  Decreased activity  tolerance, Decreased endurance, Decreased mobility, Decreased range of motion, Decreased strength, Hypomobility, Impaired flexibility, Increased muscle spasms, Impaired  UE functional use, Postural dysfunction, Improper body mechanics, Pain  Visit Diagnosis: Cervicalgia  Muscle weakness (generalized)  Abnormal posture     Problem List Patient Active Problem List   Diagnosis Date Noted   PFO (patent foramen ovale)    CAD (coronary artery disease), native coronary artery    Acute CVA (cerebrovascular accident) ([place]) 06/15/2018   Depression 10/16/2017   Migraine 10/16/2017   Hyperglycemia, unspecified 07/10/2017   Hyperglycemia 07/10/2017   H/O dizziness 08/02/2016   Cervical disc disorder with radiculopathy of cervical region 04/12/2016   Nonallopathic lesion of thoracic region 04/12/2016   Nonallopathic lesion of rib cage 04/12/2016   Facet arthritis of cervical region 03/21/2016   Hyperlipidemia 01/26/2015   Labral tear of shoulder, right, subsequent encounter 12/04/2014   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 [place], [place], [address] Phone: [phone]   Fax:  [phone]  Name: Lori Morales MRN: [id number] Date of Birth: [birth year]

## 2021-04-29 ENCOUNTER — Other Ambulatory Visit: Payer: Self-pay

## 2021-04-29 ENCOUNTER — Ambulatory Visit: Payer: 59 | Attending: Internal Medicine

## 2021-04-29 DIAGNOSIS — M6281 Muscle weakness (generalized): Secondary | ICD-10-CM | POA: Diagnosis not present

## 2021-04-29 DIAGNOSIS — R293 Abnormal posture: Secondary | ICD-10-CM | POA: Diagnosis not present

## 2021-04-29 DIAGNOSIS — M542 Cervicalgia: Secondary | ICD-10-CM | POA: Diagnosis not present

## 2021-04-29 NOTE — Therapy (Signed)
[place] MAIN [facility] SERVICES [address] [place], [place], [address] Phone: [phone]   Fax:  [phone]  Physical Therapy Treatment/RECERT  Patient Details  Name: Lori Morales MRN: [id number] Date of Birth: May 02, 1961 Referring Provider (PT): Tama High III MD   Encounter Date: 04/29/2021   PT End of Session - 04/29/21 0831     Visit Number 13    Number of Visits 29    Date for PT Re-Evaluation 06/24/21    Authorization Type 3/10 PN performed 11/21    PT Start Time 0716    PT Stop Time 0759    PT Time Calculation (min) 43 min    Activity Tolerance Patient tolerated treatment well    Behavior During Therapy [facility] for tasks assessed/performed             Past Medical History:  Diagnosis Date   Arthritis    CAD (coronary artery disease), native coronary artery    mild non calcified plaque in mid LAD and prox LCx on coronary CTA 01/2019   Depression    GERD (gastroesophageal reflux disease)    Headache    migraines   Hx of squamous cell carcinoma 07/11/2016   Right distal dorsum lat forearm    Hypertension    IBS (irritable bowel syndrome)    Palpitations    PFO (patent foramen ovale)    small by cardiac CTA 01/2019   PONV (postoperative nausea and vomiting)    Restless legs syndrome    Squamous cell carcinoma of skin 12/202/2017   right distal dorsum lateral forearm    Past Surgical History:  Procedure Laterality Date   ABDOMINOPLASTY     BACK SURGERY     1 laminectomy, 1 fusion   COLONOSCOPY     COLONOSCOPY WITH PROPOFOL N/A 01/17/2017   Procedure: COLONOSCOPY WITH PROPOFOL;  Surgeon: Lollie Sails, MD;  Location: [facility] ENDOSCOPY;  Service: Endoscopy;  Laterality: N/A;   WISDOM TOOTH EXTRACTION      There were no vitals filed for this visit.   Subjective Assessment - 04/29/21 0813     Subjective Patient had an MRI since last seen. 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 Finding could serve as a source for neck pain.    Pertinent History Patient is returning back to physical therapy for her neck. Patient has a history of C6-7 arthroplasty and has been treated by this clinician before. PMH includes anxiety, CAD, depression, arthritis of cervical region, gastritis, GERD, cancer, stroke, HLD, HTN, IB, lumbar disc disease (fusion x2 ), migraine, numbness of L hand, headaches, and restless leg syndrome. R sided neck pain with L sided tightness, but no radiating to the arm.    Limitations Lifting;Writing;House hold activities;Other (comment);Reading;Sitting    How long can you sit comfortably? Increased pain if performing deskwork for > 1/2 day.    How long can you stand comfortably? n/a    How long can you walk comfortably? n/a    Diagnostic tests MRI prior to surgery    Patient Stated Goals To be painfree and work without pain or restrictions as well as sleep better.    Currently in Pain? 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 Finding could serve as a source for neck pain.    Treatment:    Mobilization to C3-4 with movement: patient extending cervical spine over PT fingers 12x 5 second holds Grade II CPA and UPA thoracic and cervical spine 2x 10 seconds each level J mobilization 2x30 second holds  Cervical sidebend with gentle overpressure 4x60 second holds each direction  cervical rotation with gentle overpressure 4x 60 second holds each direction Suboccipital release 3x30 second holds   Median nerve glide 12x each LE Downslip of C2 and C3 R UPA for opening in supine and prone position x 8 minutes total grade II   distraction of cervical spine manually 3x45 second holds  Palpation of  painful region limited by pain and muscle guarding however bony abnormality noted at distal end of transverse process.     Pt educated throughout session about proper posture and technique with exercises. Improved exercise technique, movement at target joints, use of target muscles after min to mod verbal, visual, tactile cues.     Trigger Point Dry Needling (TDN), unbilled Education performed with patient regarding potential benefit of TDN. Reviewed precautions and risks with patient. Reviewed special precautions/risks over lung fields which include pneumothorax. Reviewed signs and symptoms of pneumothorax and advised pt to go to ER immediately if these symptoms develop advise them of dry needling treatment. Extensive time spent with pt to ensure full understanding of TDN risks. Pt provided verbal consent to treatment. TDN performed to  with 0.25 x .40 single needle placements with local twitch response (LTR) to bilateral upper traps, cervical paraspinals. Pistoning technique utilized. Improved pain-free motion following intervention. x15 minutes       Patient's goals performed on 04/19/21, please refer to this note for further details. 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 Due to her new diagnosis and recent exacerbation of pain she will require additional therapy for pain reduction. Patient will be reaching out to physician in regards to further treatment in addition to PT for pain reduction as it is severely affecting quality of life. New treatment for opening joint region performed with patient reporting relief. She will benefit from continued skilled PT services to progress her ROM, pain relief and assist in returning patient to her previous level of function in home and at work                PT Education - 04/29/21 0813     Education Details recert, MRI , opening of facet joints in cervical spine     Person(s) Educated Patient    Methods Explanation;Demonstration;Tactile cues;Verbal cues    Comprehension Verbalized understanding;Returned demonstration;Verbal cues required;Tactile cues required              PT Short Term Goals - 04/19/21 0726       PT SHORT TERM GOAL #1   Title Pt will be independent with HEP in order to improve strength and decrease neck pain in order to improve pain-free function at home and work.    Baseline 10/6: will provide next session 11/21; HEP compliant    Time 4    Period Weeks    Status Achieved    Target Date 04/01/21  PT Long Term Goals - 04/29/21 1115       PT LONG TERM GOAL #1   Title Patient will increase FOTO score to equal to or greater than  61/100   to demonstrate statistically significant improvement in mobility and quality of life.    Baseline 10/6: 48% 11/21: 46%    Time 8    Period Weeks    Status New    Target Date 06/24/21      PT LONG TERM GOAL #2   Title Pt will demonstrate decrease in NDI by at least 19% in order to demonstrate clinically significant reduction in disability related to neck injury/pain    Baseline 10/6: will perform next session 11/21: 39%    Time 8    Period Weeks    Status Partially Met    Target Date 06/24/21      PT LONG TERM GOAL #3   Title Pt will decrease worst neck pain as reported on NPRS by at least 2 points (2/10) in order to demonstrate clinically significant reduction in neck pain.    Baseline 10/6: 4/10 11/21: 8/10 muscle spasm    Time 8    Period Weeks    Status On-going    Target Date 06/24/21      PT LONG TERM GOAL #4   Title Patient will demonstrate improved Cervical ROM by 50% in order to improve her functional ability to turn her head or nod for return of functional capabilities including driving, working, and sleeping.    Baseline 10/6: see note 11/21: see note    Time 8    Period Weeks    Status Partially Met    Target Date 06/24/21      PT LONG TERM GOAL  #5   Title Patient will demonstrate decreased frequency of higher pain episodes to improve quality of life and demonstrate improved self maintanance.    Baseline 12/1: painful everyday since recent diagnosis    Time 8    Period Weeks    Status New    Target Date 06/24/21                   Plan - 04/29/21 1116     Clinical Impression Statement Patient's goals performed on 04/19/21, please refer to this note for further details. 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 Due to her new diagnosis and recent exacerbation of pain she will require additional therapy for pain reduction. Patient will be reaching out to physician in regards to further treatment in addition to PT for pain reduction as it is severely affecting quality of life. New treatment for opening joint region performed with patient reporting relief. She will benefit from continued skilled PT services to progress her ROM, pain relief and assist in returning patient to her previous level of function in home and at work    Personal Factors and Comorbidities Age;Comorbidity 3+;Past/Current Experience;Profession;Time since onset of injury/illness/exacerbation    Comorbidities anxiety, CAD, depression, arthritis of cervical region, gastritis, GERD, cancer, stroke, HLD, HTN, IB, lumbar disc disease (fusion x2 ), migraine, numbness of L hand, headaches, and restless leg syndrome    Examination-Activity Limitations Bathing;Bed Mobility;Caring for Others;Carry;Dressing;Lift;Reach Overhead;Sleep    Examination-Participation Restrictions Cleaning;Community Activity;Driving;Laundry;Occupation;Yard Work;Meal Prep    Stability/Clinical Decision Making Evolving/Moderate complexity    Rehab Potential Good    PT Frequency 2x / week    PT Duration 8 weeks    PT  Treatment/Interventions ADLs/Self Care Home Management;Cryotherapy;Electrical Stimulation;Moist Heat;Functional  mobility training;Therapeutic activities;Therapeutic exercise;Patient/family education;Manual techniques;Passive range of motion;Dry needling;Taping;Spinal Manipulations;Joint Manipulations;Traction;Ultrasound;Gait training;Stair training;Energy conservation;Visual/perceptual remediation/compensation    PT Next Visit Plan cervical distraction    PT Home Exercise Plan 04/02/2021= Access Code: [email or web address]    Consulted and Agree with Plan of Care Patient             Patient will benefit from skilled therapeutic intervention in order to improve the following deficits and impairments:  Decreased activity tolerance, Decreased endurance, Decreased mobility, Decreased range of motion, Decreased strength, Hypomobility, Impaired flexibility, Increased muscle spasms, Impaired UE functional use, Postural dysfunction, Improper body mechanics, Pain  Visit Diagnosis: Cervicalgia  Muscle weakness (generalized)  Abnormal posture     Problem List Patient Active Problem List   Diagnosis Date Noted   PFO (patent foramen ovale)    CAD (coronary artery disease), native coronary artery    Acute CVA (cerebrovascular accident) ([place]) 06/15/2018   Depression 10/16/2017   Migraine 10/16/2017   Hyperglycemia, unspecified 07/10/2017   Hyperglycemia 07/10/2017   H/O dizziness 08/02/2016   Cervical disc disorder with radiculopathy of cervical region 04/12/2016   Nonallopathic lesion of thoracic region 04/12/2016   Nonallopathic lesion of rib cage 04/12/2016   Facet arthritis of cervical region 03/21/2016   Hyperlipidemia 01/26/2015   Labral tear of shoulder, right, subsequent encounter 12/04/2014   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

## 2021-04-30 ENCOUNTER — Other Ambulatory Visit: Payer: Self-pay | Admitting: Internal Medicine

## 2021-04-30 ENCOUNTER — Other Ambulatory Visit: Payer: Self-pay | Admitting: Physician Assistant

## 2021-05-03 ENCOUNTER — Other Ambulatory Visit: Payer: Self-pay | Admitting: Internal Medicine

## 2021-05-03 ENCOUNTER — Other Ambulatory Visit: Payer: Self-pay

## 2021-05-03 ENCOUNTER — Ambulatory Visit: Payer: 59

## 2021-05-03 DIAGNOSIS — M5090 Cervical disc disorder, unspecified, unspecified cervical region: Secondary | ICD-10-CM

## 2021-05-03 DIAGNOSIS — M542 Cervicalgia: Secondary | ICD-10-CM

## 2021-05-03 DIAGNOSIS — M6281 Muscle weakness (generalized): Secondary | ICD-10-CM | POA: Diagnosis not present

## 2021-05-03 DIAGNOSIS — R293 Abnormal posture: Secondary | ICD-10-CM | POA: Diagnosis not present

## 2021-05-03 NOTE — Therapy (Signed)
[place] MAIN [facility] SERVICES [address] [place], [place], [address] Phone: [phone]   Fax:  [phone]  Physical Therapy Treatment  Patient Details  Name: Lori Morales MRN: [id number] Date of Birth: 1960-06-12 Referring Provider (PT): Tama High III MD   Encounter Date: 05/03/2021   PT End of Session - 05/03/21 0824     Visit Number 14    Number of Visits 29    Date for PT Re-Evaluation 06/24/21    Authorization Type 4/10 PN performed 11/21    PT Start Time 0715    PT Stop Time 0759    PT Time Calculation (min) 44 min    Activity Tolerance Patient tolerated treatment well    Behavior During Therapy [facility] for tasks assessed/performed             Past Medical History:  Diagnosis Date   Arthritis    CAD (coronary artery disease), native coronary artery    mild non calcified plaque in mid LAD and prox LCx on coronary CTA 01/2019   Depression    GERD (gastroesophageal reflux disease)    Headache    migraines   Hx of squamous cell carcinoma 07/11/2016   Right distal dorsum lat forearm    Hypertension    IBS (irritable bowel syndrome)    Palpitations    PFO (patent foramen ovale)    small by cardiac CTA 01/2019   PONV (postoperative nausea and vomiting)    Restless legs syndrome    Squamous cell carcinoma of skin 12/202/2017   right distal dorsum lateral forearm    Past Surgical History:  Procedure Laterality Date   ABDOMINOPLASTY     BACK SURGERY     1 laminectomy, 1 fusion   COLONOSCOPY     COLONOSCOPY WITH PROPOFOL N/A 01/17/2017   Procedure: COLONOSCOPY WITH PROPOFOL;  Surgeon: Lollie Sails, MD;  Location: [facility] ENDOSCOPY;  Service: Endoscopy;  Laterality: N/A;   WISDOM TOOTH EXTRACTION      There were no vitals filed for this visit.   Subjective Assessment - 05/03/21 0717     Subjective Patient reports she hasn't been able to get an injection yet. Has not had much relief, started a round of  prednisone yesterday.    Pertinent History Patient is returning back to physical therapy for her neck. Patient has a history of C6-7 arthroplasty and has been treated by this clinician before. PMH includes anxiety, CAD, depression, arthritis of cervical region, gastritis, GERD, cancer, stroke, HLD, HTN, IB, lumbar disc disease (fusion x2 ), migraine, numbness of L hand, headaches, and restless leg syndrome. R sided neck pain with L sided tightness, but no radiating to the arm.    Limitations Lifting;Writing;House hold activities;Other (comment);Reading;Sitting    How long can you sit comfortably? Increased pain if performing deskwork for > 1/2 day.    How long can you stand comfortably? n/a    How long can you walk comfortably? n/a    Diagnostic tests MRI prior to surgery    Patient Stated Goals To be painfree and work without pain or restrictions as well as sleep better.    Currently in Pain? Yes    Pain Score 5     Pain Location Neck    Pain Orientation Right    Pain Descriptors / Indicators Aching    Pain Type Chronic pain    Pain Onset More than a month ago    Pain Frequency Intermittent  Treatment:    Mobilization to C3-4 with movement: patient extending cervical spine over PT fingers 12x 5 second holds Grade II CPA and UPA thoracic and cervical spine 2x 10 seconds each level J mobilization 2x30 second holds  Cervical sidebend with gentle overpressure 2x60 second holds each direction  Median nerve glide 12x each LE R shoulder AP 10x 3 second holds  Downslip of C2 and C3 R UPA for opening in supine and prone position x 8 minutes total grade II   distraction of cervical spine manually 3x45 second holds  Attempt at using mechanical cervical distraction terminated due to machine not working.     Pt educated throughout session about proper posture and technique with exercises. Improved exercise technique, movement at target joints, use of target muscles  after min to mod verbal, visual, tactile cues.     Trigger Point Dry Needling (TDN), unbilled Education performed with patient regarding potential benefit of TDN. Reviewed precautions and risks with patient. Reviewed special precautions/risks over lung fields which include pneumothorax. Reviewed signs and symptoms of pneumothorax and advised pt to go to ER immediately if these symptoms develop advise them of dry needling treatment. Extensive time spent with pt to ensure full understanding of TDN risks. Pt provided verbal consent to treatment. TDN performed to  with 0.25 x .40 single needle placements with local twitch response (LTR) to bilateral upper traps, R suboccipitals, cervical paraspinals. Pistoning technique utilized. Improved pain-free motion following intervention. x18 minutes    Patient has excessive muscle guarding and neurological symptoms of cervical spine this session. Patient will benefit from mechanical distraction once machine is fixed as it was not functioning correctly this morning. Patient started new round of prednisone and educated on need for opening of spine for pain reduction techniques. She will benefit from continued skilled PT services to progress her ROM, pain relief and assist in returning patient to her previous level of function in home and at work                PT Education - 05/03/21 0823     Education Details exercise technique, body mechanics,    Person(s) Educated Patient    Methods Explanation;Demonstration;Tactile cues;Verbal cues    Comprehension Verbalized understanding;Returned demonstration;Verbal cues required;Tactile cues required              PT Short Term Goals - 04/19/21 0726       PT SHORT TERM GOAL #1   Title Pt will be independent with HEP in order to improve strength and decrease neck pain in order to improve pain-free function at home and work.    Baseline 10/6: will provide next session 11/21; HEP compliant    Time 4     Period Weeks    Status Achieved    Target Date 04/01/21               PT Long Term Goals - 04/29/21 1115       PT LONG TERM GOAL #1   Title Patient will increase FOTO score to equal to or greater than  61/100   to demonstrate statistically significant improvement in mobility and quality of life.    Baseline 10/6: 48% 11/21: 46%    Time 8    Period Weeks    Status New    Target Date 06/24/21      PT LONG TERM GOAL #2   Title Pt will demonstrate decrease in NDI by at least 19% in order to demonstrate clinically  significant reduction in disability related to neck injury/pain    Baseline 10/6: will perform next session 11/21: 39%    Time 8    Period Weeks    Status Partially Met    Target Date 06/24/21      PT LONG TERM GOAL #3   Title Pt will decrease worst neck pain as reported on NPRS by at least 2 points (2/10) in order to demonstrate clinically significant reduction in neck pain.    Baseline 10/6: 4/10 11/21: 8/10 muscle spasm    Time 8    Period Weeks    Status On-going    Target Date 06/24/21      PT LONG TERM GOAL #4   Title Patient will demonstrate improved Cervical ROM by 50% in order to improve her functional ability to turn her head or nod for return of functional capabilities including driving, working, and sleeping.    Baseline 10/6: see note 11/21: see note    Time 8    Period Weeks    Status Partially Met    Target Date 06/24/21      PT LONG TERM GOAL #5   Title Patient will demonstrate decreased frequency of higher pain episodes to improve quality of life and demonstrate improved self maintanance.    Baseline 12/1: painful everyday since recent diagnosis    Time 8    Period Weeks    Status New    Target Date 06/24/21                   Plan - 05/03/21 0903     Clinical Impression Statement Patient has excessive muscle guarding and neurological symptoms of cervical spine this session. Patient will benefit from mechanical distraction once  machine is fixed as it was not functioning correctly this morning. Patient started new round of prednisone and educated on need for opening of spine for pain reduction techniques. She will benefit from continued skilled PT services to progress her ROM, pain relief and assist in returning patient to her previous level of function in home and at work    Personal Factors and Comorbidities Age;Comorbidity 3+;Past/Current Experience;Profession;Time since onset of injury/illness/exacerbation    Comorbidities anxiety, CAD, depression, arthritis of cervical region, gastritis, GERD, cancer, stroke, HLD, HTN, IB, lumbar disc disease (fusion x2 ), migraine, numbness of L hand, headaches, and restless leg syndrome    Examination-Activity Limitations Bathing;Bed Mobility;Caring for Others;Carry;Dressing;Lift;Reach Overhead;Sleep    Examination-Participation Restrictions Cleaning;Community Activity;Driving;Laundry;Occupation;Yard Work;Meal Prep    Stability/Clinical Decision Making Evolving/Moderate complexity    Rehab Potential Good    PT Frequency 2x / week    PT Duration 8 weeks    PT Treatment/Interventions ADLs/Self Care Home Management;Cryotherapy;Electrical Stimulation;Moist Heat;Functional mobility training;Therapeutic activities;Therapeutic exercise;Patient/family education;Manual techniques;Passive range of motion;Dry needling;Taping;Spinal Manipulations;Joint Manipulations;Traction;Ultrasound;Gait training;Stair training;Energy conservation;Visual/perceptual remediation/compensation    PT Next Visit Plan cervical distraction    PT Home Exercise Plan 04/02/2021= Access Code: [email or web address]    Consulted and Agree with Plan of Care Patient             Patient will benefit from skilled therapeutic intervention in order to improve the following deficits and impairments:  Decreased activity tolerance, Decreased endurance, Decreased mobility, Decreased range of motion, Decreased strength, Hypomobility, Impaired  flexibility, Increased muscle spasms, Impaired UE functional use, Postural dysfunction, Improper body mechanics, Pain  Visit Diagnosis: Cervicalgia  Muscle weakness (generalized)  Abnormal posture     Problem List Patient Active Problem List   Diagnosis Date Noted  PFO (patent foramen ovale)    CAD (coronary artery disease), native coronary artery    Acute CVA (cerebrovascular accident) ([place]) 06/15/2018   Depression 10/16/2017   Migraine 10/16/2017   Hyperglycemia, unspecified 07/10/2017   Hyperglycemia 07/10/2017   H/O dizziness 08/02/2016   Cervical disc disorder with radiculopathy of cervical region 04/12/2016   Nonallopathic lesion of thoracic region 04/12/2016   Nonallopathic lesion of rib cage 04/12/2016   Facet arthritis of cervical region 03/21/2016   Hyperlipidemia 01/26/2015   Labral tear of shoulder, right, subsequent encounter 12/04/2014   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 [place], [place], [address] Phone: [phone]   Fax:  [phone]  Name: Lori Morales MRN: [id number] Date of Birth: 27-Jul-1960

## 2021-05-05 ENCOUNTER — Other Ambulatory Visit: Payer: Self-pay

## 2021-05-05 ENCOUNTER — Ambulatory Visit: Payer: 59

## 2021-05-05 ENCOUNTER — Ambulatory Visit: Payer: 59 | Admitting: Dermatology

## 2021-05-05 DIAGNOSIS — M6281 Muscle weakness (generalized): Secondary | ICD-10-CM

## 2021-05-05 DIAGNOSIS — L820 Inflamed seborrheic keratosis: Secondary | ICD-10-CM | POA: Diagnosis not present

## 2021-05-05 DIAGNOSIS — Z1283 Encounter for screening for malignant neoplasm of skin: Secondary | ICD-10-CM | POA: Diagnosis not present

## 2021-05-05 DIAGNOSIS — D1801 Hemangioma of skin and subcutaneous tissue: Secondary | ICD-10-CM | POA: Diagnosis not present

## 2021-05-05 DIAGNOSIS — M542 Cervicalgia: Secondary | ICD-10-CM | POA: Diagnosis not present

## 2021-05-05 DIAGNOSIS — D229 Melanocytic nevi, unspecified: Secondary | ICD-10-CM | POA: Diagnosis not present

## 2021-05-05 DIAGNOSIS — L578 Other skin changes due to chronic exposure to nonionizing radiation: Secondary | ICD-10-CM | POA: Diagnosis not present

## 2021-05-05 DIAGNOSIS — R293 Abnormal posture: Secondary | ICD-10-CM | POA: Diagnosis not present

## 2021-05-05 DIAGNOSIS — Z85828 Personal history of other malignant neoplasm of skin: Secondary | ICD-10-CM | POA: Diagnosis not present

## 2021-05-05 DIAGNOSIS — D1800 Hemangioma unspecified site: Secondary | ICD-10-CM

## 2021-05-05 DIAGNOSIS — L821 Other seborrheic keratosis: Secondary | ICD-10-CM

## 2021-05-05 DIAGNOSIS — L814 Other melanin hyperpigmentation: Secondary | ICD-10-CM | POA: Diagnosis not present

## 2021-05-05 NOTE — Patient Instructions (Signed)

## 2021-05-05 NOTE — Progress Notes (Signed)
   Follow-Up Visit   Subjective  Lori Morales is a 60 y.o. female who presents for the following: Annual Exam (Mole check ). Hx of SCC  The patient presents for Total-Body Skin Exam (TBSE) for skin cancer screening and mole check.  The patient has spots, moles and lesions to be evaluated, some may be new or changing and the patient has concerns that these could be cancer.   The following portions of the chart were reviewed this encounter and updated as appropriate:   Tobacco  Allergies  Meds  Problems  Med Hx  Surg Hx  Fam Hx     Review of Systems:  No other skin or systemic complaints except as noted in HPI or Assessment and Plan.  Objective  Well appearing patient in no apparent distress; mood and affect are within normal limits.  A full examination was performed including scalp, head, eyes, ears, nose, lips, neck, chest, axillae, abdomen, back, buttocks, bilateral upper extremities, bilateral lower extremities, hands, feet, fingers, toes, fingernails, and toenails. All findings within normal limits unless otherwise noted below.  Mid Back Erythematous keratotic or waxy stuck-on papule or plaque.   left lower leg 0.5 cm dark purple  papule         Assessment & Plan  Inflamed seborrheic keratosis Mid Back Patient declines treatment. Reassured benign age-related growth.  Recommend observation.  Discussed cryotherapy if spot(s) become irritated or inflamed.   Hemangioma of skin left lower leg Hemangioma under  dermoscopy The patient will observe these symptoms, and report promptly any worsening or unexpected persistence.  If well, may return prn.   Skin cancer screening  Lentigines - Scattered tan macules - Due to sun exposure - Benign-appearing, observe - Recommend daily broad spectrum sunscreen SPF 30+ to sun-exposed areas, reapply every 2 hours as needed. - Call for any changes  Seborrheic Keratoses - Stuck-on, waxy, tan-brown papules and/or plaques  -  Benign-appearing - Discussed benign etiology and prognosis. - Observe - Call for any changes  Melanocytic Nevi - Tan-brown and/or pink-flesh-colored symmetric macules and papules - Benign appearing on exam today - Observation - Call clinic for new or changing moles - Recommend daily use of broad spectrum spf 30+ sunscreen to sun-exposed areas.   Hemangiomas - Red papules - Discussed benign nature - Observe - Call for any changes  Actinic Damage - Chronic condition, secondary to cumulative UV/sun exposure - diffuse scaly erythematous macules with underlying dyspigmentation - Recommend daily broad spectrum sunscreen SPF 30+ to sun-exposed areas, reapply every 2 hours as needed.  - Staying in the shade or wearing long sleeves, sun glasses (UVA+UVB protection) and wide brim hats (4-inch brim around the entire circumference of the hat) are also recommended for sun protection.  - Call for new or changing lesions.  History of Squamous Cell Carcinoma of the Skin Right distal lateral forearm 2018 - No evidence of recurrence today - No lymphadenopathy - Recommend regular full body skin exams - Recommend daily broad spectrum sunscreen SPF 30+ to sun-exposed areas, reapply every 2 hours as needed.  - Call if any new or changing lesions are noted between office visits  Skin cancer screening performed today.   Return in about 1 year (around 05/05/2022) for TBSE, Hx of SCC .  IMarye Round, CMA, am acting as scribe for Sarina Ser, MD .  Documentation: I have reviewed the above documentation for accuracy and completeness, and I agree with the above.  Sarina Ser, MD

## 2021-05-05 NOTE — Therapy (Signed)
[place] MAIN [facility] SERVICES [address] [place], [place], [address] Phone: [phone]   Fax:  [phone]  Physical Therapy Treatment  Patient Details  Name: Lori Morales MRN: [id number] Date of Birth: [birth year] Referring Provider (PT): Tama High III MD   Encounter Date: 05/05/2021   PT End of Session - 05/05/21 0724     Visit Number 15    Number of Visits 29    Date for PT Re-Evaluation 06/24/21    Authorization Type 5/10 PN performed 11/21    PT Start Time 0715    PT Stop Time 0753    PT Time Calculation (min) 38 min    Activity Tolerance Patient tolerated treatment well    Behavior During Therapy [facility] for tasks assessed/performed             Past Medical History:  Diagnosis Date   Arthritis    CAD (coronary artery disease), native coronary artery    mild non calcified plaque in mid LAD and prox LCx on coronary CTA 01/2019   Depression    GERD (gastroesophageal reflux disease)    Headache    migraines   Hx of squamous cell carcinoma 07/11/2016   Right distal dorsum lat forearm    Hypertension    IBS (irritable bowel syndrome)    Palpitations    PFO (patent foramen ovale)    small by cardiac CTA 01/2019   PONV (postoperative nausea and vomiting)    Restless legs syndrome    Squamous cell carcinoma of skin 12/202/2017   right distal dorsum lateral forearm    Past Surgical History:  Procedure Laterality Date   ABDOMINOPLASTY     BACK SURGERY     1 laminectomy, 1 fusion   COLONOSCOPY     COLONOSCOPY WITH PROPOFOL N/A 01/17/2017   Procedure: COLONOSCOPY WITH PROPOFOL;  Surgeon: Lollie Sails, MD;  Location: [facility] ENDOSCOPY;  Service: Endoscopy;  Laterality: N/A;   WISDOM TOOTH EXTRACTION      There were no vitals filed for this visit.   Subjective Assessment - 05/05/21 0722     Subjective Patient reports that it was a bad day yesterday. Is getting her epidural on Tuesday the 13th.    Pertinent History  Patient is returning back to physical therapy for her neck. Patient has a history of C6-7 arthroplasty and has been treated by this clinician before. PMH includes anxiety, CAD, depression, arthritis of cervical region, gastritis, GERD, cancer, stroke, HLD, HTN, IB, lumbar disc disease (fusion x2 ), migraine, numbness of L hand, headaches, and restless leg syndrome. R sided neck pain with L sided tightness, but no radiating to the arm.    Limitations Lifting;Writing;House hold activities;Other (comment);Reading;Sitting    How long can you sit comfortably? Increased pain if performing deskwork for > 1/2 day.    How long can you stand comfortably? n/a    How long can you walk comfortably? n/a    Diagnostic tests MRI prior to surgery    Patient Stated Goals To be painfree and work without pain or restrictions as well as sleep better.    Currently in Pain? Yes    Pain Score 5     Pain Location Neck    Pain Orientation Right    Pain Descriptors / Indicators Aching    Pain Type Chronic pain    Pain Onset More than a month ago    Pain Frequency Intermittent  Treatment:    Mobilization to C3-4 with movement: patient extending cervical spine over PT fingers 12x 5 second holds Grade II CPA and UPA thoracic and cervical spine 2x 10 seconds each level J mobilization 2x30 second holds  Cervical sidebend with gentle overpressure 2x60 second holds each direction     Cervical traction machine: 25 seconds on 5 seconds off 24lb of force at 45 degree angle, patient given switch for on, off  Pt educated throughout session about proper posture and technique with exercises. Improved exercise technique, movement at target joints, use of target muscles after min to mod verbal, visual, tactile cues.     Trigger Point Dry Needling (TDN), unbilled Education performed with patient regarding potential benefit of TDN. Reviewed precautions and risks with patient. Reviewed special  precautions/risks over lung fields which include pneumothorax. Reviewed signs and symptoms of pneumothorax and advised pt to go to ER immediately if these symptoms develop advise them of dry needling treatment. Extensive time spent with pt to ensure full understanding of TDN risks. Pt provided verbal consent to treatment. TDN performed to  with 0.25 x .40 single needle placements with local twitch response (LTR) to bilateral upper traps, R suboccipitals, cervical paraspinals. Pistoning technique utilized. Improved pain-free motion following intervention. x18 minutes   Patient introduced to mechanical traction with excellent results. Her session is shortened due to another appointment after PT session. Patient will receive her epidural. She is highly motivated throughout session despite recent regression and is very motivated for pain reduction interventions. She will benefit from continued skilled PT services to progress her ROM, pain relief and assist in returning patient to her previous level of function in home and at work                PT Education - 05/05/21 0724     Education Details exercise technique, body mechanics    Person(s) Educated Patient    Methods Explanation;Demonstration;Tactile cues;Verbal cues    Comprehension Verbalized understanding;Returned demonstration;Verbal cues required;Tactile cues required              PT Short Term Goals - 04/19/21 0726       PT SHORT TERM GOAL #1   Title Pt will be independent with HEP in order to improve strength and decrease neck pain in order to improve pain-free function at home and work.    Baseline 10/6: will provide next session 11/21; HEP compliant    Time 4    Period Weeks    Status Achieved    Target Date 04/01/21               PT Long Term Goals - 04/29/21 1115       PT LONG TERM GOAL #1   Title Patient will increase FOTO score to equal to or greater than  61/100   to demonstrate statistically  significant improvement in mobility and quality of life.    Baseline 10/6: 48% 11/21: 46%    Time 8    Period Weeks    Status New    Target Date 06/24/21      PT LONG TERM GOAL #2   Title Pt will demonstrate decrease in NDI by at least 19% in order to demonstrate clinically significant reduction in disability related to neck injury/pain    Baseline 10/6: will perform next session 11/21: 39%    Time 8    Period Weeks    Status Partially Met    Target Date 06/24/21  PT LONG TERM GOAL #3   Title Pt will decrease worst neck pain as reported on NPRS by at least 2 points (2/10) in order to demonstrate clinically significant reduction in neck pain.    Baseline 10/6: 4/10 11/21: 8/10 muscle spasm    Time 8    Period Weeks    Status On-going    Target Date 06/24/21      PT LONG TERM GOAL #4   Title Patient will demonstrate improved Cervical ROM by 50% in order to improve her functional ability to turn her head or nod for return of functional capabilities including driving, working, and sleeping.    Baseline 10/6: see note 11/21: see note    Time 8    Period Weeks    Status Partially Met    Target Date 06/24/21      PT LONG TERM GOAL #5   Title Patient will demonstrate decreased frequency of higher pain episodes to improve quality of life and demonstrate improved self maintanance.    Baseline 12/1: painful everyday since recent diagnosis    Time 8    Period Weeks    Status New    Target Date 06/24/21                   Plan - 05/05/21 6962     Clinical Impression Statement Patient introduced to mechanical traction with excellent results. Her session is shortened due to another appointment after PT session. Patient will receive her epidural. She is highly motivated throughout session despite recent regression and is very motivated for pain reduction interventions. She will benefit from continued skilled PT services to progress her ROM, pain relief and assist in returning  patient to her previous level of function in home and at work    Personal Factors and Comorbidities Age;Comorbidity 3+;Past/Current Experience;Profession;Time since onset of injury/illness/exacerbation    Comorbidities anxiety, CAD, depression, arthritis of cervical region, gastritis, GERD, cancer, stroke, HLD, HTN, IB, lumbar disc disease (fusion x2 ), migraine, numbness of L hand, headaches, and restless leg syndrome    Examination-Activity Limitations Bathing;Bed Mobility;Caring for Others;Carry;Dressing;Lift;Reach Overhead;Sleep    Examination-Participation Restrictions Cleaning;Community Activity;Driving;Laundry;Occupation;Yard Work;Meal Prep    Stability/Clinical Decision Making Evolving/Moderate complexity    Rehab Potential Good    PT Frequency 2x / week    PT Duration 8 weeks    PT Treatment/Interventions ADLs/Self Care Home Management;Cryotherapy;Electrical Stimulation;Moist Heat;Functional mobility training;Therapeutic activities;Therapeutic exercise;Patient/family education;Manual techniques;Passive range of motion;Dry needling;Taping;Spinal Manipulations;Joint Manipulations;Traction;Ultrasound;Gait training;Stair training;Energy conservation;Visual/perceptual remediation/compensation    PT Next Visit Plan cervical distraction    PT Home Exercise Plan 04/02/2021= Access Code: [email or web address]    Consulted and Agree with Plan of Care Patient             Patient will benefit from skilled therapeutic intervention in order to improve the following deficits and impairments:  Decreased activity tolerance, Decreased endurance, Decreased mobility, Decreased range of motion, Decreased strength, Hypomobility, Impaired flexibility, Increased muscle spasms, Impaired UE functional use, Postural dysfunction, Improper body mechanics, Pain  Visit Diagnosis: Cervicalgia  Muscle weakness (generalized)  Abnormal posture     Problem List Patient Active Problem List   Diagnosis Date Noted   PFO  (patent foramen ovale)    CAD (coronary artery disease), native coronary artery    Acute CVA (cerebrovascular accident) ([place]) 06/15/2018   Depression 10/16/2017   Migraine 10/16/2017   Hyperglycemia, unspecified 07/10/2017   Hyperglycemia 07/10/2017   H/O dizziness 08/02/2016   Cervical disc disorder with radiculopathy of cervical  region 04/12/2016   Nonallopathic lesion of thoracic region 04/12/2016   Nonallopathic lesion of rib cage 04/12/2016   Facet arthritis of cervical region 03/21/2016   Hyperlipidemia 01/26/2015   Labral tear of shoulder, right, subsequent encounter 12/04/2014   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 [place], [place], [address] Phone: [phone]   Fax:  [phone]  Name: Lori Morales MRN: [id number] Date of Birth: 27-Oct-1960

## 2021-05-06 ENCOUNTER — Other Ambulatory Visit: Payer: Self-pay

## 2021-05-06 MED ORDER — HYDROCODONE-ACETAMINOPHEN 5-325 MG PO TABS
ORAL_TABLET | ORAL | 0 refills | Status: DC
  Filled 2021-05-06: qty 20, 5d supply, fill #0

## 2021-05-10 ENCOUNTER — Ambulatory Visit: Payer: 59

## 2021-05-10 ENCOUNTER — Ambulatory Visit
Admission: RE | Admit: 2021-05-10 | Discharge: 2021-05-10 | Disposition: A | Discharge status: Home / Self Care | Payer: 59 | Source: Ambulatory Visit | Attending: Obstetrics and Gynecology | Admitting: Obstetrics and Gynecology

## 2021-05-10 ENCOUNTER — Other Ambulatory Visit: Payer: Self-pay

## 2021-05-10 DIAGNOSIS — M542 Cervicalgia: Secondary | ICD-10-CM

## 2021-05-10 DIAGNOSIS — R293 Abnormal posture: Secondary | ICD-10-CM

## 2021-05-10 DIAGNOSIS — M6281 Muscle weakness (generalized): Secondary | ICD-10-CM

## 2021-05-10 DIAGNOSIS — Z1231 Encounter for screening mammogram for malignant neoplasm of breast: Secondary | ICD-10-CM | POA: Insufficient documentation

## 2021-05-10 NOTE — Therapy (Signed)
[place] MAIN [facility] SERVICES [address] [place], [place], [address] Phone: [phone]   Fax:  [phone]  Physical Therapy Treatment  Patient Details  Name: Lori Morales MRN: [id number] Date of Birth: January 12, 1961 Referring Provider (PT): Tama High III MD   Encounter Date: 05/10/2021   PT End of Session - 05/10/21 0803     Visit Number 16    Number of Visits 29    Date for PT Re-Evaluation 06/24/21    Authorization Type 6/10 PN performed 11/21    PT Start Time 0715    PT Stop Time 0759    PT Time Calculation (min) 44 min    Activity Tolerance Patient tolerated treatment well    Behavior During Therapy [facility] for tasks assessed/performed             Past Medical History:  Diagnosis Date   Arthritis    CAD (coronary artery disease), native coronary artery    mild non calcified plaque in mid LAD and prox LCx on coronary CTA 01/2019   Depression    GERD (gastroesophageal reflux disease)    Headache    migraines   Hx of squamous cell carcinoma 07/11/2016   Right distal dorsum lat forearm    Hypertension    IBS (irritable bowel syndrome)    Palpitations    PFO (patent foramen ovale)    small by cardiac CTA 01/2019   PONV (postoperative nausea and vomiting)    Restless legs syndrome    Squamous cell carcinoma of skin 12/202/2017   right distal dorsum lateral forearm    Past Surgical History:  Procedure Laterality Date   ABDOMINOPLASTY     BACK SURGERY     1 laminectomy, 1 fusion   COLONOSCOPY     COLONOSCOPY WITH PROPOFOL N/A 01/17/2017   Procedure: COLONOSCOPY WITH PROPOFOL;  Surgeon: Lollie Sails, MD;  Location: [facility] ENDOSCOPY;  Service: Endoscopy;  Laterality: N/A;   WISDOM TOOTH EXTRACTION      There were no vitals filed for this visit.   Subjective Assessment - 05/10/21 0717     Subjective Patient reports the traction caused her to have spasming later that day. Is getting her injection tomorrow.     Pertinent History Patient is returning back to physical therapy for her neck. Patient has a history of C6-7 arthroplasty and has been treated by this clinician before. PMH includes anxiety, CAD, depression, arthritis of cervical region, gastritis, GERD, cancer, stroke, HLD, HTN, IB, lumbar disc disease (fusion x2 ), migraine, numbness of L hand, headaches, and restless leg syndrome. R sided neck pain with L sided tightness, but no radiating to the arm.    Limitations Lifting;Writing;Morales hold activities;Other (comment);Reading;Sitting    How long can you sit comfortably? Increased pain if performing deskwork for > 1/2 day.    How long can you stand comfortably? n/a    How long can you walk comfortably? n/a    Diagnostic tests MRI prior to surgery    Patient Stated Goals To be painfree and work without pain or restrictions as well as sleep better.    Currently in Pain? Yes    Pain Score 4     Pain Location Neck    Pain Orientation Right    Pain Descriptors / Indicators Aching    Pain Type Chronic pain    Pain Onset More than a month ago    Pain Frequency Intermittent  Treatment:    Mobilization to C3-4 with movement: patient extending cervical spine over PT fingers 12x 5 second holds Grade II CPA and UPA thoracic and cervical spine 2x 10 seconds each level J mobilization 2x30 second holds  Cervical sidebend with gentle overpressure 2x60 second holds each direction  Median nerve glide 12x each LE R shoulder AP 10x 3 second holds  Downslip of C2 and C3 R UPA for opening in supine and prone position x 8 minutes total grade II   distraction of cervical spine manually 3x45 second holds STM with implementation of effleurage and pettrisage to cervical paraspinals and upper traps x 7 minutes   Attempt at using mechanical cervical distraction terminated due to machine not working.     Pt educated throughout session about proper posture and technique with  exercises. Improved exercise technique, movement at target joints, use of target muscles after min to mod verbal, visual, tactile cues.     Trigger Point Dry Needling (TDN), unbilled Education performed with patient regarding potential benefit of TDN. Reviewed precautions and risks with patient. Reviewed special precautions/risks over lung fields which include pneumothorax. Reviewed signs and symptoms of pneumothorax and advised pt to go to ER immediately if these symptoms develop advise them of dry needling treatment. Extensive time spent with pt to ensure full understanding of TDN risks. Pt provided verbal consent to treatment. TDN performed to  with 0.25 x .40 single needle placements with local twitch response (LTR) to bilateral upper traps, R suboccipitals, cervical paraspinals. Pistoning technique utilized. Improved pain-free motion following intervention. x12 minutes                        PT Education - 05/10/21 0803     Education Details exercise technique, manual    Person(s) Educated Patient    Methods Explanation;Demonstration;Tactile cues;Verbal cues    Comprehension Verbalized understanding;Returned demonstration;Verbal cues required;Tactile cues required              PT Short Term Goals - 04/19/21 0726       PT SHORT TERM GOAL #1   Title Pt will be independent with HEP in order to improve strength and decrease neck pain in order to improve pain-free function at home and work.    Baseline 10/6: will provide next session 11/21; HEP compliant    Time 4    Period Weeks    Status Achieved    Target Date 04/01/21               PT Long Term Goals - 04/29/21 1115       PT LONG TERM GOAL #1   Title Patient will increase FOTO score to equal to or greater than  61/100   to demonstrate statistically significant improvement in mobility and quality of life.    Baseline 10/6: 48% 11/21: 46%    Time 8    Period Weeks    Status New    Target Date 06/24/21       PT LONG TERM GOAL #2   Title Pt will demonstrate decrease in NDI by at least 19% in order to demonstrate clinically significant reduction in disability related to neck injury/pain    Baseline 10/6: will perform next session 11/21: 39%    Time 8    Period Weeks    Status Partially Met    Target Date 06/24/21      PT LONG TERM GOAL #3   Title Pt will decrease worst  neck pain as reported on NPRS by at least 2 points (2/10) in order to demonstrate clinically significant reduction in neck pain.    Baseline 10/6: 4/10 11/21: 8/10 muscle spasm    Time 8    Period Weeks    Status On-going    Target Date 06/24/21      PT LONG TERM GOAL #4   Title Patient will demonstrate improved Cervical ROM by 50% in order to improve her functional ability to turn her head or nod for return of functional capabilities including driving, working, and sleeping.    Baseline 10/6: see note 11/21: see note    Time 8    Period Weeks    Status Partially Met    Target Date 06/24/21      PT LONG TERM GOAL #5   Title Patient will demonstrate decreased frequency of higher pain episodes to improve quality of life and demonstrate improved self maintanance.    Baseline 12/1: painful everyday since recent diagnosis    Time 8    Period Weeks    Status New    Target Date 06/24/21                   Plan - 05/10/21 0804     Clinical Impression Statement Patient tolerated session well today. Has had no pain increase by end of session. Use of STM reduced pain per patient report. Patient has significant  muscle guarding throughout session and is undergoing an injection tomorrow for pain reduction due to it impacting her quality of life at this time. She will benefit from continued skilled PT services to progress her ROM, pain relief and assist in returning patient to her previous level of function in home and at work    Personal Factors and Comorbidities Age;Comorbidity 3+;Past/Current  Experience;Profession;Time since onset of injury/illness/exacerbation    Comorbidities anxiety, CAD, depression, arthritis of cervical region, gastritis, GERD, cancer, stroke, HLD, HTN, IB, lumbar disc disease (fusion x2 ), migraine, numbness of L hand, headaches, and restless leg syndrome    Examination-Activity Limitations Bathing;Bed Mobility;Caring for Others;Carry;Dressing;Lift;Reach Overhead;Sleep    Examination-Participation Restrictions Cleaning;Community Activity;Driving;Laundry;Occupation;Yard Work;Meal Prep    Stability/Clinical Decision Making Evolving/Moderate complexity    Rehab Potential Good    PT Frequency 2x / week    PT Duration 8 weeks    PT Treatment/Interventions ADLs/Self Care Home Management;Cryotherapy;Electrical Stimulation;Moist Heat;Functional mobility training;Therapeutic activities;Therapeutic exercise;Patient/family education;Manual techniques;Passive range of motion;Dry needling;Taping;Spinal Manipulations;Joint Manipulations;Traction;Ultrasound;Gait training;Stair training;Energy conservation;Visual/perceptual remediation/compensation    PT Home Exercise Plan 04/02/2021= Access Code: [email or web address]    Consulted and Agree with Plan of Care Patient             Patient will benefit from skilled therapeutic intervention in order to improve the following deficits and impairments:  Decreased activity tolerance, Decreased endurance, Decreased mobility, Decreased range of motion, Decreased strength, Hypomobility, Impaired flexibility, Increased muscle spasms, Impaired UE functional use, Postural dysfunction, Improper body mechanics, Pain  Visit Diagnosis: Cervicalgia  Muscle weakness (generalized)  Abnormal posture     Problem List Patient Active Problem List   Diagnosis Date Noted   PFO (patent foramen ovale)    CAD (coronary artery disease), native coronary artery    Acute CVA (cerebrovascular accident) ([place]) 06/15/2018   Depression 10/16/2017   Migraine  10/16/2017   Hyperglycemia, unspecified 07/10/2017   Hyperglycemia 07/10/2017   H/O dizziness 08/02/2016   Cervical disc disorder with radiculopathy of cervical region 04/12/2016   Nonallopathic lesion of thoracic region 04/12/2016  Nonallopathic lesion of rib cage 04/12/2016   Facet arthritis of cervical region 03/21/2016   Hyperlipidemia 01/26/2015   Labral tear of shoulder, right, subsequent encounter 12/04/2014   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 [place], [place], [address] Phone: [phone]   Fax:  [phone]  Name: Lori Morales MRN: [id number] Date of Birth: [birth year]

## 2021-05-11 ENCOUNTER — Other Ambulatory Visit: Payer: 59

## 2021-05-11 ENCOUNTER — Ambulatory Visit
Admission: RE | Admit: 2021-05-11 | Discharge: 2021-05-11 | Disposition: A | Discharge status: Home / Self Care | Payer: 59 | Source: Ambulatory Visit | Attending: Internal Medicine | Admitting: Internal Medicine

## 2021-05-11 DIAGNOSIS — M47812 Spondylosis without myelopathy or radiculopathy, cervical region: Secondary | ICD-10-CM | POA: Diagnosis not present

## 2021-05-11 DIAGNOSIS — M5090 Cervical disc disorder, unspecified, unspecified cervical region: Secondary | ICD-10-CM

## 2021-05-11 MED ORDER — IOPAMIDOL (ISOVUE-M 300) INJECTION 61%
1.0000 mL | Freq: Once | INTRAMUSCULAR | Status: AC | PRN
  Administered 2021-05-11: 1 mL via INTRA_ARTICULAR

## 2021-05-11 MED ORDER — DEXAMETHASONE SODIUM PHOSPHATE 4 MG/ML IJ SOLN
6.0000 mg | Freq: Once | INTRAMUSCULAR | Status: AC
  Administered 2021-05-11: 6 mg via INTRA_ARTICULAR

## 2021-05-11 NOTE — Discharge Instructions (Signed)
Post Procedure Spinal Discharge Instruction Sheet  You may resume a regular diet and any medications that you routinely take (including pain medications) unless otherwise noted by MD.  No driving day of procedure.  Light activity throughout the rest of the day.  Do not do any strenuous work, exercise, bending or lifting.  The day following the procedure, you can resume normal physical activity but you should refrain from exercising or physical therapy for at least three days thereafter.  You may apply ice to the injection site, 20 minutes on, 20 minutes off, as needed. Do not apply ice directly to skin.    Common Side Effects:  Headaches- take your usual medications as directed by your physician.  Increase your fluid intake.  Caffeinated beverages may be helpful.  Lie flat in bed until your headache resolves.  Restlessness or inability to sleep- you may have trouble sleeping for the next few days.  Ask your referring physician if you need any medication for sleep.  Facial flushing or redness- should subside within a few days.  Increased pain- a temporary increase in pain a day or two following your procedure is not unusual.  Take your pain medication as prescribed by your referring physician.  Leg cramps  Please contact our office at [phone] for the following symptoms: Fever greater than 100 degrees. Headaches unresolved with medication after 2-3 days. Increased swelling, pain, or redness at injection site.    YOU MAY RESUME YOUR ASPIRIN ANYTIME AFTER INJECTION TODAY  Thank you for visiting [facility] today.

## 2021-05-12 ENCOUNTER — Ambulatory Visit: Payer: 59

## 2021-05-14 ENCOUNTER — Encounter: Payer: Self-pay | Admitting: Dermatology

## 2021-05-18 ENCOUNTER — Other Ambulatory Visit: Payer: Self-pay

## 2021-05-18 ENCOUNTER — Ambulatory Visit: Payer: 59

## 2021-05-18 DIAGNOSIS — R293 Abnormal posture: Secondary | ICD-10-CM | POA: Diagnosis not present

## 2021-05-18 DIAGNOSIS — M542 Cervicalgia: Secondary | ICD-10-CM | POA: Diagnosis not present

## 2021-05-18 DIAGNOSIS — M6281 Muscle weakness (generalized): Secondary | ICD-10-CM

## 2021-05-18 NOTE — Therapy (Signed)
[place] MAIN [facility] SERVICES [address] [place], [place], [address] Phone: [phone]   Fax:  [phone]  Physical Therapy Treatment  Patient Details  Name: Lori Morales MRN: [id number] Date of Birth: January 04, 1961 Referring Provider (PT): Tama High III MD   Encounter Date: 05/18/2021   PT End of Session - 05/18/21 0856     Visit Number 17    Number of Visits 29    Date for PT Re-Evaluation 06/24/21    Authorization Type 7/10 PN performed 11/21    PT Start Time 0715    PT Stop Time 0759    PT Time Calculation (min) 44 min    Activity Tolerance Patient tolerated treatment well    Behavior During Therapy [facility] for tasks assessed/performed             Past Medical History:  Diagnosis Date   Arthritis    CAD (coronary artery disease), native coronary artery    mild non calcified plaque in mid LAD and prox LCx on coronary CTA 01/2019   Depression    GERD (gastroesophageal reflux disease)    Headache    migraines   Hx of squamous cell carcinoma 07/11/2016   Right distal dorsum lat forearm    Hypertension    IBS (irritable bowel syndrome)    Palpitations    PFO (patent foramen ovale)    small by cardiac CTA 01/2019   PONV (postoperative nausea and vomiting)    Restless legs syndrome    Squamous cell carcinoma of skin 12/202/2017   right distal dorsum lateral forearm    Past Surgical History:  Procedure Laterality Date   ABDOMINOPLASTY     BACK SURGERY     1 laminectomy, 1 fusion   COLONOSCOPY     COLONOSCOPY WITH PROPOFOL N/A 01/17/2017   Procedure: COLONOSCOPY WITH PROPOFOL;  Surgeon: Lollie Sails, MD;  Location: [facility] ENDOSCOPY;  Service: Endoscopy;  Laterality: N/A;   WISDOM TOOTH EXTRACTION      There were no vitals filed for this visit.   Subjective Assessment - 05/18/21 0853     Subjective Patient underwent an epidural 1 week ago. Feels slightly better but not much. Still having issues sleeping, has to  sleep on recliner.    Pertinent History Patient is returning back to physical therapy for her neck. Patient has a history of C6-7 arthroplasty and has been treated by this clinician before. PMH includes anxiety, CAD, depression, arthritis of cervical region, gastritis, GERD, cancer, stroke, HLD, HTN, IB, lumbar disc disease (fusion x2 ), migraine, numbness of L hand, headaches, and restless leg syndrome. R sided neck pain with L sided tightness, but no radiating to the arm.    Limitations Lifting;Writing;House hold activities;Other (comment);Reading;Sitting    How long can you sit comfortably? Increased pain if performing deskwork for > 1/2 day.    How long can you stand comfortably? n/a    How long can you walk comfortably? n/a    Diagnostic tests MRI prior to surgery    Patient Stated Goals To be painfree and work without pain or restrictions as well as sleep better.    Currently in Pain? Yes    Pain Score 4     Pain Location Neck    Pain Orientation Right;Left    Pain Descriptors / Indicators Aching    Pain Type Acute pain    Pain Onset More than a month ago    Pain Frequency Constant  Treatment:    Mobilization to C3-4 with movement: patient extending cervical spine over PT fingers 12x 5 second holds Grade II CPA and UPA thoracic and cervical spine 2x 10 seconds each level J mobilization 2x30 second holds  Cervical sidebend with gentle overpressure 2x60 second holds each direction  Downslip of C2 and C3 R UPA for opening in supine and prone position x 8 minutes total grade II   distraction of cervical spine manually 3x45 second holds Chin tuck with towel behind neck 12x 3 second holds Cervical extension with retraction 12x  STM with implementation of effleurage and pettrisage to cervical paraspinals and upper traps x 7 minutes  Pt educated throughout session about proper posture and technique with exercises. Improved exercise technique, movement at target  joints, use of target muscles after min to mod verbal, visual, tactile cues.     Trigger Point Dry Needling (TDN), unbilled Education performed with patient regarding potential benefit of TDN. Reviewed precautions and risks with patient. Reviewed special precautions/risks over lung fields which include pneumothorax. Reviewed signs and symptoms of pneumothorax and advised pt to go to ER immediately if these symptoms develop advise them of dry needling treatment. Extensive time spent with pt to ensure full understanding of TDN risks. Pt provided verbal consent to treatment. TDN performed to  with 0.25 x .40 single needle placements with local twitch response (LTR) to bilateral upper traps, R suboccipitals, cervical paraspinals. Pistoning technique utilized. Improved pain-free motion following intervention. x12 minutes      Patient has significant trigger points to lateral R cervical region that improved with manual and therex in addition to TDN. Multiple trigger points of upper traps released with TDN this session. Patient reports some decreased pain by the end of session. Strengthening tolerated with no pain increase. She will benefit from continued skilled PT services to progress her ROM, pain relief and assist in returning patient to her previous level of function in home and at work                    PT Education - 05/18/21 0854     Education Details exercise technique, manual, TDN    Person(s) Educated Patient    Methods Explanation;Demonstration;Tactile cues;Verbal cues    Comprehension Verbalized understanding;Returned demonstration;Verbal cues required;Tactile cues required              PT Short Term Goals - 04/19/21 0726       PT SHORT TERM GOAL #1   Title Pt will be independent with HEP in order to improve strength and decrease neck pain in order to improve pain-free function at home and work.    Baseline 10/6: will provide next session 11/21; HEP compliant     Time 4    Period Weeks    Status Achieved    Target Date 04/01/21               PT Long Term Goals - 04/29/21 1115       PT LONG TERM GOAL #1   Title Patient will increase FOTO score to equal to or greater than  61/100   to demonstrate statistically significant improvement in mobility and quality of life.    Baseline 10/6: 48% 11/21: 46%    Time 8    Period Weeks    Status New    Target Date 06/24/21      PT LONG TERM GOAL #2   Title Pt will demonstrate decrease in NDI by at least  19% in order to demonstrate clinically significant reduction in disability related to neck injury/pain    Baseline 10/6: will perform next session 11/21: 39%    Time 8    Period Weeks    Status Partially Met    Target Date 06/24/21      PT LONG TERM GOAL #3   Title Pt will decrease worst neck pain as reported on NPRS by at least 2 points (2/10) in order to demonstrate clinically significant reduction in neck pain.    Baseline 10/6: 4/10 11/21: 8/10 muscle spasm    Time 8    Period Weeks    Status On-going    Target Date 06/24/21      PT LONG TERM GOAL #4   Title Patient will demonstrate improved Cervical ROM by 50% in order to improve her functional ability to turn her head or nod for return of functional capabilities including driving, working, and sleeping.    Baseline 10/6: see note 11/21: see note    Time 8    Period Weeks    Status Partially Met    Target Date 06/24/21      PT LONG TERM GOAL #5   Title Patient will demonstrate decreased frequency of higher pain episodes to improve quality of life and demonstrate improved self maintanance.    Baseline 12/1: painful everyday since recent diagnosis    Time 8    Period Weeks    Status New    Target Date 06/24/21                   Plan - 05/18/21 0858     Clinical Impression Statement Patient has significant trigger points to lateral R cervical region that improved with manual and therex in addition to TDN. Multiple  trigger points of upper traps released with TDN this session. Patient reports some decreased pain by the end of session. Strengthening tolerated with no pain increase. She will benefit from continued skilled PT services to progress her ROM, pain relief and assist in returning patient to her previous level of function in home and at work    Personal Factors and Comorbidities Age;Comorbidity 3+;Past/Current Experience;Profession;Time since onset of injury/illness/exacerbation    Comorbidities anxiety, CAD, depression, arthritis of cervical region, gastritis, GERD, cancer, stroke, HLD, HTN, IB, lumbar disc disease (fusion x2 ), migraine, numbness of L hand, headaches, and restless leg syndrome    Examination-Activity Limitations Bathing;Bed Mobility;Caring for Others;Carry;Dressing;Lift;Reach Overhead;Sleep    Examination-Participation Restrictions Cleaning;Community Activity;Driving;Laundry;Occupation;Yard Work;Meal Prep    Stability/Clinical Decision Making Evolving/Moderate complexity    Rehab Potential Good    PT Frequency 2x / week    PT Duration 8 weeks    PT Treatment/Interventions ADLs/Self Care Home Management;Cryotherapy;Electrical Stimulation;Moist Heat;Functional mobility training;Therapeutic activities;Therapeutic exercise;Patient/family education;Manual techniques;Passive range of motion;Dry needling;Taping;Spinal Manipulations;Joint Manipulations;Traction;Ultrasound;Gait training;Stair training;Energy conservation;Visual/perceptual remediation/compensation    PT Home Exercise Plan 04/02/2021= Access Code: [email or web address]    Consulted and Agree with Plan of Care Patient             Patient will benefit from skilled therapeutic intervention in order to improve the following deficits and impairments:  Decreased activity tolerance, Decreased endurance, Decreased mobility, Decreased range of motion, Decreased strength, Hypomobility, Impaired flexibility, Increased muscle spasms, Impaired UE  functional use, Postural dysfunction, Improper body mechanics, Pain  Visit Diagnosis: Cervicalgia  Muscle weakness (generalized)  Abnormal posture     Problem List Patient Active Problem List   Diagnosis Date Noted   PFO (patent foramen ovale)  CAD (coronary artery disease), native coronary artery    Acute CVA (cerebrovascular accident) ([place]) 06/15/2018   Depression 10/16/2017   Migraine 10/16/2017   Hyperglycemia, unspecified 07/10/2017   Hyperglycemia 07/10/2017   H/O dizziness 08/02/2016   Cervical disc disorder with radiculopathy of cervical region 04/12/2016   Nonallopathic lesion of thoracic region 04/12/2016   Nonallopathic lesion of rib cage 04/12/2016   Facet arthritis of cervical region 03/21/2016   Hyperlipidemia 01/26/2015   Labral tear of shoulder, right, subsequent encounter 12/04/2014   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 [place], [place], [address] Phone: [phone]   Fax:  [phone]  Name: ZYANYA GLAZA MRN: [id number] Date of Birth: 19-May-1961

## 2021-05-19 ENCOUNTER — Other Ambulatory Visit: Payer: Self-pay

## 2021-05-19 MED ORDER — HYDROCODONE-ACETAMINOPHEN 5-325 MG PO TABS
ORAL_TABLET | ORAL | 0 refills | Status: DC
  Filled 2021-05-19: qty 20, 5d supply, fill #0

## 2021-05-20 ENCOUNTER — Ambulatory Visit: Payer: 59

## 2021-05-25 ENCOUNTER — Ambulatory Visit: Payer: 59 | Admitting: Physical Therapy

## 2021-05-27 ENCOUNTER — Ambulatory Visit: Payer: 59

## 2021-06-01 ENCOUNTER — Other Ambulatory Visit: Payer: Self-pay

## 2021-06-01 MED ORDER — AZITHROMYCIN 250 MG PO TABS
ORAL_TABLET | ORAL | 0 refills | Status: DC
  Filled 2021-06-01: qty 6, 5d supply, fill #0

## 2021-06-02 ENCOUNTER — Other Ambulatory Visit: Payer: Self-pay

## 2021-06-02 ENCOUNTER — Ambulatory Visit: Payer: 59 | Attending: Internal Medicine

## 2021-06-02 DIAGNOSIS — M542 Cervicalgia: Secondary | ICD-10-CM | POA: Insufficient documentation

## 2021-06-02 DIAGNOSIS — R293 Abnormal posture: Secondary | ICD-10-CM | POA: Insufficient documentation

## 2021-06-02 DIAGNOSIS — M6281 Muscle weakness (generalized): Secondary | ICD-10-CM | POA: Insufficient documentation

## 2021-06-02 NOTE — Therapy (Signed)
[place] MAIN [facility] SERVICES [address] [place], [place], [address] Phone: [phone]   Fax:  [phone]  Physical Therapy Treatment  Patient Details  Name: Lori Morales MRN: [id number] Date of Birth: 1960-12-10 Referring Provider (PT): Tama High III MD   Encounter Date: 06/02/2021   PT End of Session - 06/02/21 0903     Visit Number 18    Number of Visits 29    Date for PT Re-Evaluation 06/24/21    Authorization Type 8/10 PN performed 11/21    PT Start Time 0718    PT Stop Time 0759    PT Time Calculation (min) 41 min    Activity Tolerance Patient tolerated treatment well    Behavior During Therapy [facility] for tasks assessed/performed             Past Medical History:  Diagnosis Date   Arthritis    CAD (coronary artery disease), native coronary artery    mild non calcified plaque in mid LAD and prox LCx on coronary CTA 01/2019   Depression    GERD (gastroesophageal reflux disease)    Headache    migraines   Hx of squamous cell carcinoma 07/11/2016   Right distal dorsum lat forearm    Hypertension    IBS (irritable bowel syndrome)    Palpitations    PFO (patent foramen ovale)    small by cardiac CTA 01/2019   PONV (postoperative nausea and vomiting)    Restless legs syndrome    Squamous cell carcinoma of skin 12/202/2017   right distal dorsum lateral forearm    Past Surgical History:  Procedure Laterality Date   ABDOMINOPLASTY     BACK SURGERY     1 laminectomy, 1 fusion   COLONOSCOPY     COLONOSCOPY WITH PROPOFOL N/A 01/17/2017   Procedure: COLONOSCOPY WITH PROPOFOL;  Surgeon: Lollie Sails, MD;  Location: [facility] ENDOSCOPY;  Service: Endoscopy;  Laterality: N/A;   WISDOM TOOTH EXTRACTION      There were no vitals filed for this visit.   Subjective Assessment - 06/02/21 0902     Subjective Patient reports her cervical pain has gotten worse, has gotten to a 9/10 pain, would happen more in the evening and  radiate to the arm.    Pertinent History Patient is returning back to physical therapy for her neck. Patient has a history of C6-7 arthroplasty and has been treated by this clinician before. PMH includes anxiety, CAD, depression, arthritis of cervical region, gastritis, GERD, cancer, stroke, HLD, HTN, IB, lumbar disc disease (fusion x2 ), migraine, numbness of L hand, headaches, and restless leg syndrome. R sided neck pain with L sided tightness, but no radiating to the arm.    Limitations Lifting;Writing;House hold activities;Other (comment);Reading;Sitting    How long can you sit comfortably? Increased pain if performing deskwork for > 1/2 day.    How long can you stand comfortably? n/a    How long can you walk comfortably? n/a    Diagnostic tests MRI prior to surgery    Patient Stated Goals To be painfree and work without pain or restrictions as well as sleep better.    Currently in Pain? Yes    Pain Score 5     Pain Location Neck    Pain Orientation Right;Left    Pain Descriptors / Indicators Aching;Squeezing;Stabbing    Pain Type Acute pain;Chronic pain    Pain Onset More than a month ago    Pain  Frequency Constant                 Patient reports her cervical pain has gotten worse, has gotten to a 9/10 pain, would happen more in the evening and radiate to the arm.     Treatment:    Mobilization to C3-4 with movement: patient extending cervical spine over PT fingers 12x 5 second holds Grade II CPA and UPA thoracic and cervical spine 4x 10 seconds each level J mobilization 2x30 second holds  Cervical sidebend with gentle overpressure 2x60 second holds each direction  Cervical rotation with gentle overpressure 2x 60 second holds each direction  Downslip of C2 and C3 R UPA for opening in supine and prone position x 8 minutes total grade II  distraction of cervical spine manually 3x45 second holds RUE median nerve glide 10x with overpressure   Pt educated throughout session  about proper posture and technique with exercises. Improved exercise technique, movement at target joints, use of target muscles after min to mod verbal, visual, tactile cues.     Trigger Point Dry Needling (TDN), unbilled Education performed with patient regarding potential benefit of TDN. Reviewed precautions and risks with patient. Reviewed special precautions/risks over lung fields which include pneumothorax. Reviewed signs and symptoms of pneumothorax and advised pt to go to ER immediately if these symptoms develop advise them of dry needling treatment. Extensive time spent with pt to ensure full understanding of TDN risks. Pt provided verbal consent to treatment. TDN performed to  with 0.25 x .40 single needle placements with local twitch response (LTR) to bilateral upper traps, R suboccipitals, cervical paraspinals. Pistoning technique utilized. Improved pain-free motion following intervention. x12 minutes     Patient has had recent flare up during holidays, discussion on need to follow up with ortho due to limited improvement with therapy at this time. Therapy appears to reduce pain short term but patient has flare ups with prolonged absence from therapy. Patient agreeable and is meeting with physician later this month.She will benefit from continued skilled PT services to progress her ROM, pain relief and assist in returning patient to her previous level of function in home and at work                  PT Education - 06/02/21 0903     Education Details follow up with physicican    Person(s) Educated Patient    Methods Explanation;Demonstration;Tactile cues;Verbal cues    Comprehension Verbalized understanding;Returned demonstration;Verbal cues required;Tactile cues required              PT Short Term Goals - 04/19/21 0726       PT SHORT TERM GOAL #1   Title Pt will be independent with HEP in order to improve strength and decrease neck pain in order to improve  pain-free function at home and work.    Baseline 10/6: will provide next session 11/21; HEP compliant    Time 4    Period Weeks    Status Achieved    Target Date 04/01/21               PT Long Term Goals - 04/29/21 1115       PT LONG TERM GOAL #1   Title Patient will increase FOTO score to equal to or greater than  61/100   to demonstrate statistically significant improvement in mobility and quality of life.    Baseline 10/6: 48% 11/21: 46%    Time 8  Period Weeks    Status New    Target Date 06/24/21      PT LONG TERM GOAL #2   Title Pt will demonstrate decrease in NDI by at least 19% in order to demonstrate clinically significant reduction in disability related to neck injury/pain    Baseline 10/6: will perform next session 11/21: 39%    Time 8    Period Weeks    Status Partially Met    Target Date 06/24/21      PT LONG TERM GOAL #3   Title Pt will decrease worst neck pain as reported on NPRS by at least 2 points (2/10) in order to demonstrate clinically significant reduction in neck pain.    Baseline 10/6: 4/10 11/21: 8/10 muscle spasm    Time 8    Period Weeks    Status On-going    Target Date 06/24/21      PT LONG TERM GOAL #4   Title Patient will demonstrate improved Cervical ROM by 50% in order to improve her functional ability to turn her head or nod for return of functional capabilities including driving, working, and sleeping.    Baseline 10/6: see note 11/21: see note    Time 8    Period Weeks    Status Partially Met    Target Date 06/24/21      PT LONG TERM GOAL #5   Title Patient will demonstrate decreased frequency of higher pain episodes to improve quality of life and demonstrate improved self maintanance.    Baseline 12/1: painful everyday since recent diagnosis    Time 8    Period Weeks    Status New    Target Date 06/24/21                   Plan - 06/02/21 5056     Clinical Impression Statement Patient has had recent flare up  during holidays, discussion on need to follow up with ortho due to limited improvement with therapy at this time. Therapy appears to reduce pain short term but patient has flare ups with prolonged absence from therapy. Patient agreeable and is meeting with physician later this month.She will benefit from continued skilled PT services to progress her ROM, pain relief and assist in returning patient to her previous level of function in home and at work    Personal Factors and Comorbidities Age;Comorbidity 3+;Past/Current Experience;Profession;Time since onset of injury/illness/exacerbation    Comorbidities anxiety, CAD, depression, arthritis of cervical region, gastritis, GERD, cancer, stroke, HLD, HTN, IB, lumbar disc disease (fusion x2 ), migraine, numbness of L hand, headaches, and restless leg syndrome    Examination-Activity Limitations Bathing;Bed Mobility;Caring for Others;Carry;Dressing;Lift;Reach Overhead;Sleep    Examination-Participation Restrictions Cleaning;Community Activity;Driving;Laundry;Occupation;Yard Work;Meal Prep    Stability/Clinical Decision Making Evolving/Moderate complexity    Rehab Potential Good    PT Frequency 2x / week    PT Duration 8 weeks    PT Treatment/Interventions ADLs/Self Care Home Management;Cryotherapy;Electrical Stimulation;Moist Heat;Functional mobility training;Therapeutic activities;Therapeutic exercise;Patient/family education;Manual techniques;Passive range of motion;Dry needling;Taping;Spinal Manipulations;Joint Manipulations;Traction;Ultrasound;Gait training;Stair training;Energy conservation;Visual/perceptual remediation/compensation    PT Home Exercise Plan 04/02/2021= Access Code: [email or web address]    Consulted and Agree with Plan of Care Patient             Patient will benefit from skilled therapeutic intervention in order to improve the following deficits and impairments:  Decreased activity tolerance, Decreased endurance, Decreased mobility, Decreased  range of motion, Decreased strength, Hypomobility, Impaired flexibility, Increased muscle spasms, Impaired UE functional use,  Postural dysfunction, Improper body mechanics, Pain  Visit Diagnosis: Cervicalgia  Muscle weakness (generalized)  Abnormal posture     Problem List Patient Active Problem List   Diagnosis Date Noted   PFO (patent foramen ovale)    CAD (coronary artery disease), native coronary artery    Acute CVA (cerebrovascular accident) ([place]) 06/15/2018   Depression 10/16/2017   Migraine 10/16/2017   Hyperglycemia, unspecified 07/10/2017   Hyperglycemia 07/10/2017   H/O dizziness 08/02/2016   Cervical disc disorder with radiculopathy of cervical region 04/12/2016   Nonallopathic lesion of thoracic region 04/12/2016   Nonallopathic lesion of rib cage 04/12/2016   Facet arthritis of cervical region 03/21/2016   Hyperlipidemia 01/26/2015   Labral tear of shoulder, right, subsequent encounter 12/04/2014   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

## 2021-06-07 ENCOUNTER — Ambulatory Visit: Payer: 59

## 2021-06-07 ENCOUNTER — Other Ambulatory Visit: Payer: Self-pay

## 2021-06-07 DIAGNOSIS — R293 Abnormal posture: Secondary | ICD-10-CM | POA: Diagnosis not present

## 2021-06-07 DIAGNOSIS — M6281 Muscle weakness (generalized): Secondary | ICD-10-CM

## 2021-06-07 DIAGNOSIS — M542 Cervicalgia: Secondary | ICD-10-CM

## 2021-06-07 NOTE — Therapy (Signed)
[place] MAIN [facility] SERVICES [address] [place], [place], [address] Phone: [phone]   Fax:  [phone]  Physical Therapy Treatment  Patient Details  Name: Lori Morales MRN: [id number] Date of Birth: 06-04-1960 Referring Provider (PT): Tama High III MD   Encounter Date: 06/07/2021   PT End of Session - 06/07/21 0749     Visit Number 19    Number of Visits 29    Date for PT Re-Evaluation 06/24/21    Authorization Type 9/10 PN performed 11/21    PT Start Time 0715    PT Stop Time 0757    PT Time Calculation (min) 42 min    Activity Tolerance Patient tolerated treatment well    Behavior During Therapy [facility] for tasks assessed/performed             Past Medical History:  Diagnosis Date   Arthritis    CAD (coronary artery disease), native coronary artery    mild non calcified plaque in mid LAD and prox LCx on coronary CTA 01/2019   Depression    GERD (gastroesophageal reflux disease)    Headache    migraines   Hx of squamous cell carcinoma 07/11/2016   Right distal dorsum lat forearm    Hypertension    IBS (irritable bowel syndrome)    Palpitations    PFO (patent foramen ovale)    small by cardiac CTA 01/2019   PONV (postoperative nausea and vomiting)    Restless legs syndrome    Squamous cell carcinoma of skin 12/202/2017   right distal dorsum lateral forearm    Past Surgical History:  Procedure Laterality Date   ABDOMINOPLASTY     BACK SURGERY     1 laminectomy, 1 fusion   COLONOSCOPY     COLONOSCOPY WITH PROPOFOL N/A 01/17/2017   Procedure: COLONOSCOPY WITH PROPOFOL;  Surgeon: Lollie Sails, MD;  Location: [facility] ENDOSCOPY;  Service: Endoscopy;  Laterality: N/A;   WISDOM TOOTH EXTRACTION      There were no vitals filed for this visit.   Subjective Assessment - 06/07/21 0717     Subjective Patient reports the pain has been staying the same. Still can't sleep in the bed.    Pertinent History Patient is  returning back to physical therapy for her neck. Patient has a history of C6-7 arthroplasty and has been treated by this clinician before. PMH includes anxiety, CAD, depression, arthritis of cervical region, gastritis, GERD, cancer, stroke, HLD, HTN, IB, lumbar disc disease (fusion x2 ), migraine, numbness of L hand, headaches, and restless leg syndrome. R sided neck pain with L sided tightness, but no radiating to the arm.    Limitations Lifting;Writing;House hold activities;Other (comment);Reading;Sitting    How long can you sit comfortably? Increased pain if performing deskwork for > 1/2 day.    How long can you stand comfortably? n/a    How long can you walk comfortably? n/a    Diagnostic tests MRI prior to surgery    Patient Stated Goals To be painfree and work without pain or restrictions as well as sleep better.    Currently in Pain? Yes    Pain Score 3     Pain Location Neck    Pain Orientation Right;Left    Pain Descriptors / Indicators Aching    Pain Type Chronic pain    Pain Onset More than a month ago    Pain Frequency Constant  Treatment:    Mobilization to C3-4 with movement: patient extending cervical spine over PT fingers 12x 5 second holds Grade II CPA and UPA thoracic and cervical spine 4x 10 seconds each level J mobilization 2x30 second holds  Cervical sidebend with gentle overpressure 2x60 second holds each direction  Cervical rotation with gentle overpressure 2x 60 second holds each direction  Downslip of C2 and C3 R UPA for opening in supine and prone position x 8 minutes total grade II  distraction of cervical spine manually 3x45 second holds RUE median nerve glide 10x with overpressure Trunk extensions 10x over towel roll  RTB Y overhead raises 10x 5 second holds RTB ER 10x  with cues for scapular retraction  Swiss ball forward trunk rollout 10x 10 second holds  Pt educated throughout session about proper posture and technique with  exercises. Improved exercise technique, movement at target joints, use of target muscles after min to mod verbal, visual, tactile cues.     Trigger Point Dry Needling (TDN), unbilled Education performed with patient regarding potential benefit of TDN. Reviewed precautions and risks with patient. Reviewed special precautions/risks over lung fields which include pneumothorax. Reviewed signs and symptoms of pneumothorax and advised pt to go to ER immediately if these symptoms develop advise them of dry needling treatment. Extensive time spent with pt to ensure full understanding of TDN risks. Pt provided verbal consent to treatment. TDN performed to  with 0.25 x .40 single needle placements with local twitch response (LTR) to bilateral upper traps, R suboccipitals, cervical paraspinals. Pistoning technique utilized. Improved pain-free motion following intervention. x12 minutes   Patient is highly motivated, she is able to tolerate strengthening this session without pain increase. She has decreased tone and hypomobility this session indicating improved cervical alignment. Continuing to recommend patient follow up with physician due to inability to sleep in bed and high pain levels.  She will benefit from continued skilled PT services to progress her ROM, pain relief and assist in returning patient to her previous level of function in home and at work                       PT Education - 06/07/21 0718     Education Details body mechanics, follow up with phsyician, TDN    Person(s) Educated Patient    Methods Explanation;Demonstration;Tactile cues;Verbal cues    Comprehension Verbalized understanding;Returned demonstration;Verbal cues required;Tactile cues required              PT Short Term Goals - 04/19/21 0726       PT SHORT TERM GOAL #1   Title Pt will be independent with HEP in order to improve strength and decrease neck pain in order to improve pain-free function at home  and work.    Baseline 10/6: will provide next session 11/21; HEP compliant    Time 4    Period Weeks    Status Achieved    Target Date 04/01/21               PT Long Term Goals - 04/29/21 1115       PT LONG TERM GOAL #1   Title Patient will increase FOTO score to equal to or greater than  61/100   to demonstrate statistically significant improvement in mobility and quality of life.    Baseline 10/6: 48% 11/21: 46%    Time 8    Period Weeks    Status New    Target Date  06/24/21      PT LONG TERM GOAL #2   Title Pt will demonstrate decrease in NDI by at least 19% in order to demonstrate clinically significant reduction in disability related to neck injury/pain    Baseline 10/6: will perform next session 11/21: 39%    Time 8    Period Weeks    Status Partially Met    Target Date 06/24/21      PT LONG TERM GOAL #3   Title Pt will decrease worst neck pain as reported on NPRS by at least 2 points (2/10) in order to demonstrate clinically significant reduction in neck pain.    Baseline 10/6: 4/10 11/21: 8/10 muscle spasm    Time 8    Period Weeks    Status On-going    Target Date 06/24/21      PT LONG TERM GOAL #4   Title Patient will demonstrate improved Cervical ROM by 50% in order to improve her functional ability to turn her head or nod for return of functional capabilities including driving, working, and sleeping.    Baseline 10/6: see note 11/21: see note    Time 8    Period Weeks    Status Partially Met    Target Date 06/24/21      PT LONG TERM GOAL #5   Title Patient will demonstrate decreased frequency of higher pain episodes to improve quality of life and demonstrate improved self maintanance.    Baseline 12/1: painful everyday since recent diagnosis    Time 8    Period Weeks    Status New    Target Date 06/24/21                   Plan - 06/07/21 1145     Clinical Impression Statement Patient is highly motivated, she is able to tolerate  strengthening this session without pain increase. She has decreased tone and hypomobility this session indicating improved cervical alignment. Continuing to recommend patient follow up with physician due to inability to sleep in bed and high pain levels.  She will benefit from continued skilled PT services to progress her ROM, pain relief and assist in returning patient to her previous level of function in home and at work    Personal Factors and Comorbidities Age;Comorbidity 3+;Past/Current Experience;Profession;Time since onset of injury/illness/exacerbation    Comorbidities anxiety, CAD, depression, arthritis of cervical region, gastritis, GERD, cancer, stroke, HLD, HTN, IB, lumbar disc disease (fusion x2 ), migraine, numbness of L hand, headaches, and restless leg syndrome    Examination-Activity Limitations Bathing;Bed Mobility;Caring for Others;Carry;Dressing;Lift;Reach Overhead;Sleep    Examination-Participation Restrictions Cleaning;Community Activity;Driving;Laundry;Occupation;Yard Work;Meal Prep    Stability/Clinical Decision Making Evolving/Moderate complexity    Rehab Potential Good    PT Frequency 2x / week    PT Duration 8 weeks    PT Treatment/Interventions ADLs/Self Care Home Management;Cryotherapy;Electrical Stimulation;Moist Heat;Functional mobility training;Therapeutic activities;Therapeutic exercise;Patient/family education;Manual techniques;Passive range of motion;Dry needling;Taping;Spinal Manipulations;Joint Manipulations;Traction;Ultrasound;Gait training;Stair training;Energy conservation;Visual/perceptual remediation/compensation    PT Home Exercise Plan 04/02/2021= Access Code: [email or web address]    Consulted and Agree with Plan of Care Patient             Patient will benefit from skilled therapeutic intervention in order to improve the following deficits and impairments:  Decreased activity tolerance, Decreased endurance, Decreased mobility, Decreased range of motion, Decreased  strength, Hypomobility, Impaired flexibility, Increased muscle spasms, Impaired UE functional use, Postural dysfunction, Improper body mechanics, Pain  Visit Diagnosis: Cervicalgia  Muscle weakness (generalized)  Abnormal  posture     Problem List Patient Active Problem List   Diagnosis Date Noted   PFO (patent foramen ovale)    CAD (coronary artery disease), native coronary artery    Acute CVA (cerebrovascular accident) ([place]) 06/15/2018   Depression 10/16/2017   Migraine 10/16/2017   Hyperglycemia, unspecified 07/10/2017   Hyperglycemia 07/10/2017   H/O dizziness 08/02/2016   Cervical disc disorder with radiculopathy of cervical region 04/12/2016   Nonallopathic lesion of thoracic region 04/12/2016   Nonallopathic lesion of rib cage 04/12/2016   Facet arthritis of cervical region 03/21/2016   Hyperlipidemia 01/26/2015   Labral tear of shoulder, right, subsequent encounter 12/04/2014   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 [place], [place], [address] Phone: [phone]   Fax:  [phone]  Name: NAVIAH BELFIELD MRN: [id number] Date of Birth: [birth year]

## 2021-06-09 ENCOUNTER — Ambulatory Visit: Payer: 59

## 2021-06-09 ENCOUNTER — Other Ambulatory Visit: Payer: Self-pay

## 2021-06-09 DIAGNOSIS — M47812 Spondylosis without myelopathy or radiculopathy, cervical region: Secondary | ICD-10-CM | POA: Diagnosis not present

## 2021-06-09 MED ORDER — DIAZEPAM 10 MG PO TABS
ORAL_TABLET | ORAL | 0 refills | Status: DC
  Filled 2021-06-09: qty 1, 1d supply, fill #0

## 2021-06-10 ENCOUNTER — Other Ambulatory Visit: Payer: Self-pay | Admitting: Internal Medicine

## 2021-06-10 DIAGNOSIS — M810 Age-related osteoporosis without current pathological fracture: Secondary | ICD-10-CM

## 2021-06-11 ENCOUNTER — Other Ambulatory Visit: Payer: Self-pay | Admitting: Neurological Surgery

## 2021-06-11 DIAGNOSIS — M47812 Spondylosis without myelopathy or radiculopathy, cervical region: Secondary | ICD-10-CM

## 2021-06-14 ENCOUNTER — Ambulatory Visit: Payer: 59

## 2021-06-14 ENCOUNTER — Other Ambulatory Visit: Payer: Self-pay

## 2021-06-14 DIAGNOSIS — M6281 Muscle weakness (generalized): Secondary | ICD-10-CM

## 2021-06-14 DIAGNOSIS — M542 Cervicalgia: Secondary | ICD-10-CM | POA: Diagnosis not present

## 2021-06-14 DIAGNOSIS — R293 Abnormal posture: Secondary | ICD-10-CM | POA: Diagnosis not present

## 2021-06-14 NOTE — Therapy (Signed)
[place] MAIN [facility] SERVICES [address] [place], [place], [address] Phone: [phone]   Fax:  [phone]  Physical Therapy Treatment/ Physical Therapy Progress Note   Dates of reporting period  04/19/21   to   06/14/21   Patient Details  Name: Lori Morales MRN: [id number] Date of Birth: November 26, 1960 Referring Provider (PT): Tama High III MD   Encounter Date: 06/14/2021   PT End of Session - 06/14/21 0729     Visit Number 20    Number of Visits 29    Date for PT Re-Evaluation 06/24/21    Authorization Type 10/10 PN performed 11/21; next session 1/10 PN 1/16    PT Start Time 0715    PT Stop Time 0759    PT Time Calculation (min) 44 min    Activity Tolerance Patient tolerated treatment well    Behavior During Therapy [facility] for tasks assessed/performed             Past Medical History:  Diagnosis Date   Arthritis    CAD (coronary artery disease), native coronary artery    mild non calcified plaque in mid LAD and prox LCx on coronary CTA 01/2019   Depression    GERD (gastroesophageal reflux disease)    Headache    migraines   Hx of squamous cell carcinoma 07/11/2016   Right distal dorsum lat forearm    Hypertension    IBS (irritable bowel syndrome)    Palpitations    PFO (patent foramen ovale)    small by cardiac CTA 01/2019   PONV (postoperative nausea and vomiting)    Restless legs syndrome    Squamous cell carcinoma of skin 12/202/2017   right distal dorsum lateral forearm    Past Surgical History:  Procedure Laterality Date   ABDOMINOPLASTY     BACK SURGERY     1 laminectomy, 1 fusion   COLONOSCOPY     COLONOSCOPY WITH PROPOFOL N/A 01/17/2017   Procedure: COLONOSCOPY WITH PROPOFOL;  Surgeon: Lollie Sails, MD;  Location: [facility] ENDOSCOPY;  Service: Endoscopy;  Laterality: N/A;   WISDOM TOOTH EXTRACTION      There were no vitals filed for this visit.   Subjective Assessment - 06/14/21 0721      Subjective Patient saw physician who said he didn't see anything surgery related. Wants to do another facet injection. There is a lot of arthritis.    Pertinent History Patient is returning back to physical therapy for her neck. Patient has a history of C6-7 arthroplasty and has been treated by this clinician before. PMH includes anxiety, CAD, depression, arthritis of cervical region, gastritis, GERD, cancer, stroke, HLD, HTN, IB, lumbar disc disease (fusion x2 ), migraine, numbness of L hand, headaches, and restless leg syndrome. R sided neck pain with L sided tightness, but no radiating to the arm.    Limitations Lifting;Writing;House hold activities;Other (comment);Reading;Sitting    How long can you sit comfortably? Increased pain if performing deskwork for > 1/2 day.    How long can you stand comfortably? n/a    How long can you walk comfortably? n/a    Diagnostic tests MRI prior to surgery    Patient Stated Goals To be painfree and work without pain or restrictions as well as sleep better.    Currently in Pain? Yes    Pain Score 3     Pain Location Neck    Pain Orientation Right;Left    Pain Descriptors / Indicators Aching  Pain Type Chronic pain    Pain Onset More than a month ago                    Progress note FOTO 50 NDI: 40%  VAS: 5/10  Cervical ROM    Right Left  Flexion 35  Extension 30  Side Bending 20 20  Rotation 46 55    Decreased frequency of higher pain   Trigger Point Dry Needling (TDN), unbilled Education performed with patient regarding potential benefit of TDN. Reviewed precautions and risks with patient. Reviewed special precautions/risks over lung fields which include pneumothorax. Reviewed signs and symptoms of pneumothorax and advised pt to go to ER immediately if these symptoms develop advise them of dry needling treatment. Extensive time spent with pt to ensure full understanding of TDN risks. Pt provided verbal consent to treatment. TDN  performed to  with 0.25 x .40 single needle placements with local twitch response (LTR) to bilateral upper traps, R suboccipitals, cervical paraspinals. Pistoning technique utilized. Improved pain-free motion following intervention. x12 minutes  TherEx: Cervical rotation with opposite scapular depression 5x30 seconds Cervical extension with scapular retraction 10x 10 second holds   Patient's condition has the potential to improve in response to therapy. Maximum improvement is yet to be obtained. The anticipated improvement is attainable and reasonable in a generally predictable time.  Patient reports her pain is improving but not where she would like to be yet.   Pt educated throughout session about proper posture and technique with exercises. Improved exercise technique, movement at target joints, use of target muscles after min to mod verbal, visual, tactile cues.    Patient makes excellent progress towards functional goals at this time. She is having less severe pain and improving her function. However, she continues to have limitations that will require additional therapy to address. Patient has been able to sleep in her bed last three days indicating patient has finally progressed past plateau and is being positively impacted by plan of care. Patient's condition has the potential to improve in response to therapy. Maximum improvement is yet to be obtained. The anticipated improvement is attainable and reasonable in a generally predictable time. She will benefit from continued skilled PT services to progress her ROM, pain relief and assist in returning patient to her previous level of function in home and at work              PT Education - 06/14/21 0727     Education Details body mechanics, TDN, goals    Person(s) Educated Patient    Methods Explanation;Demonstration;Tactile cues;Verbal cues    Comprehension Verbalized understanding;Returned demonstration;Verbal cues  required;Tactile cues required              PT Short Term Goals - 04/19/21 0726       PT SHORT TERM GOAL #1   Title Pt will be independent with HEP in order to improve strength and decrease neck pain in order to improve pain-free function at home and work.    Baseline 10/6: will provide next session 11/21; HEP compliant    Time 4    Period Weeks    Status Achieved    Target Date 04/01/21               PT Long Term Goals - 06/14/21 0731       PT LONG TERM GOAL #1   Title Patient will increase FOTO score to equal to or greater than  61/100  to demonstrate statistically significant improvement in mobility and quality of life.    Baseline 10/6: 48% 11/21: 46% 1/16: 50%    Time 8    Period Weeks    Status Partially Met    Target Date 06/24/21      PT LONG TERM GOAL #2   Title Pt will demonstrate decrease in NDI by at least 19% in order to demonstrate clinically significant reduction in disability related to neck injury/pain    Baseline 10/6: will perform next session 11/21: 39% 1/16: 40%    Time 8    Period Weeks    Status On-going    Target Date 06/24/21      PT LONG TERM GOAL #3   Title Pt will decrease worst neck pain as reported on NPRS by at least 2 points (2/10) in order to demonstrate clinically significant reduction in neck pain.    Baseline 10/6: 4/10 11/21: 8/10 muscle spasm 1/16; 5/10    Time 8    Period Weeks    Status Partially Met    Target Date 06/24/21      PT LONG TERM GOAL #4   Title Patient will demonstrate improved Cervical ROM by 50% in order to improve her functional ability to turn her head or nod for return of functional capabilities including driving, working, and sleeping.    Baseline 10/6: see note 11/21: see note 1/16: see note    Time 8    Period Weeks    Status Partially Met    Target Date 06/24/21      PT LONG TERM GOAL #5   Title Patient will demonstrate decreased frequency of higher pain episodes to improve quality of life and  demonstrate improved self maintanance.    Baseline 12/1: painful everyday since recent diagnosis 1/16: slight decrease, able to sleep in bed for past three days    Time 8    Period Weeks    Status Partially Met    Target Date 06/24/21                   Plan - 06/14/21 0734     Clinical Impression Statement Patient makes excellent progress towards functional goals at this time. She is having less severe pain and improving her function. However, she continues to have limitations that will require additional therapy to address. Patient has been able to sleep in her bed last three days indicating patient has finally progressed past plateau and is being positively impacted by plan of care. ?Patient's condition has the potential to improve in response to therapy. Maximum improvement is yet to be obtained. The anticipated improvement is attainable and reasonable in a generally predictable time. She will benefit from continued skilled PT services to progress her ROM, pain relief and assist in returning patient to her previous level of function in home and at work    Personal Factors and Comorbidities Age;Comorbidity 3+;Past/Current Experience;Profession;Time since onset of injury/illness/exacerbation    Comorbidities anxiety, CAD, depression, arthritis of cervical region, gastritis, GERD, cancer, stroke, HLD, HTN, IB, lumbar disc disease (fusion x2 ), migraine, numbness of L hand, headaches, and restless leg syndrome    Examination-Activity Limitations Bathing;Bed Mobility;Caring for Others;Carry;Dressing;Lift;Reach Overhead;Sleep    Examination-Participation Restrictions Cleaning;Community Activity;Driving;Laundry;Occupation;Yard Work;Meal Prep    Stability/Clinical Decision Making Evolving/Moderate complexity    Rehab Potential Good    PT Frequency 2x / week    PT Duration 8 weeks    PT Treatment/Interventions ADLs/Self Care Home Management;Cryotherapy;Electrical Stimulation;Moist  Heat;Functional  mobility training;Therapeutic activities;Therapeutic exercise;Patient/family education;Manual techniques;Passive range of motion;Dry needling;Taping;Spinal Manipulations;Joint Manipulations;Traction;Ultrasound;Gait training;Stair training;Energy conservation;Visual/perceptual remediation/compensation    PT Home Exercise Plan 04/02/2021= Access Code: [email or web address]    Consulted and Agree with Plan of Care Patient             Patient will benefit from skilled therapeutic intervention in order to improve the following deficits and impairments:  Decreased activity tolerance, Decreased endurance, Decreased mobility, Decreased range of motion, Decreased strength, Hypomobility, Impaired flexibility, Increased muscle spasms, Impaired UE functional use, Postural dysfunction, Improper body mechanics, Pain  Visit Diagnosis: Cervicalgia  Muscle weakness (generalized)  Abnormal posture     Problem List Patient Active Problem List   Diagnosis Date Noted   PFO (patent foramen ovale)    CAD (coronary artery disease), native coronary artery    Acute CVA (cerebrovascular accident) ([place]) 06/15/2018   Depression 10/16/2017   Migraine 10/16/2017   Hyperglycemia, unspecified 07/10/2017   Hyperglycemia 07/10/2017   H/O dizziness 08/02/2016   Cervical disc disorder with radiculopathy of cervical region 04/12/2016   Nonallopathic lesion of thoracic region 04/12/2016   Nonallopathic lesion of rib cage 04/12/2016   Facet arthritis of cervical region 03/21/2016   Hyperlipidemia 01/26/2015   Labral tear of shoulder, right, subsequent encounter 12/04/2014   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 [place], [place], [address] Phone: [phone]   Fax:  [phone]  Name: Lori Morales MRN: [id number] Date of Birth: Oct 07, 1960

## 2021-06-15 ENCOUNTER — Other Ambulatory Visit: Payer: Self-pay

## 2021-06-16 ENCOUNTER — Ambulatory Visit: Payer: 59

## 2021-06-16 ENCOUNTER — Other Ambulatory Visit: Payer: Self-pay

## 2021-06-16 DIAGNOSIS — M6281 Muscle weakness (generalized): Secondary | ICD-10-CM

## 2021-06-16 DIAGNOSIS — R293 Abnormal posture: Secondary | ICD-10-CM

## 2021-06-16 DIAGNOSIS — M542 Cervicalgia: Secondary | ICD-10-CM

## 2021-06-16 NOTE — Therapy (Signed)
[place] MAIN [facility] SERVICES [address] [place], [place], [address] Phone: [phone]   Fax:  [phone]  Physical Therapy Treatment  Patient Details  Name: Lori Morales MRN: [id number] Date of Birth: February 06, 1961 Referring Provider (PT): Tama High III MD   Encounter Date: 06/16/2021   PT End of Session - 06/16/21 0717     Visit Number 21    Number of Visits 29    Date for PT Re-Evaluation 06/24/21    Authorization Type 1/10 PN 1/16    PT Start Time 0716    PT Stop Time 0759    PT Time Calculation (min) 43 min    Activity Tolerance Patient tolerated treatment well    Behavior During Therapy [facility] for tasks assessed/performed             Past Medical History:  Diagnosis Date   Arthritis    CAD (coronary artery disease), native coronary artery    mild non calcified plaque in mid LAD and prox LCx on coronary CTA 01/2019   Depression    GERD (gastroesophageal reflux disease)    Headache    migraines   Hx of squamous cell carcinoma 07/11/2016   Right distal dorsum lat forearm    Hypertension    IBS (irritable bowel syndrome)    Palpitations    PFO (patent foramen ovale)    small by cardiac CTA 01/2019   PONV (postoperative nausea and vomiting)    Restless legs syndrome    Squamous cell carcinoma of skin 12/202/2017   right distal dorsum lateral forearm    Past Surgical History:  Procedure Laterality Date   ABDOMINOPLASTY     BACK SURGERY     1 laminectomy, 1 fusion   COLONOSCOPY     COLONOSCOPY WITH PROPOFOL N/A 01/17/2017   Procedure: COLONOSCOPY WITH PROPOFOL;  Surgeon: Lollie Sails, MD;  Location: [facility] ENDOSCOPY;  Service: Endoscopy;  Laterality: N/A;   WISDOM TOOTH EXTRACTION      There were no vitals filed for this visit.   Subjective Assessment - 06/16/21 0748     Subjective Patient reports pain remains manageable but has not been able to return to knitting.    Pertinent History Patient is  returning back to physical therapy for her neck. Patient has a history of C6-7 arthroplasty and has been treated by this clinician before. PMH includes anxiety, CAD, depression, arthritis of cervical region, gastritis, GERD, cancer, stroke, HLD, HTN, IB, lumbar disc disease (fusion x2 ), migraine, numbness of L hand, headaches, and restless leg syndrome. R sided neck pain with L sided tightness, but no radiating to the arm.    Limitations Lifting;Writing;House hold activities;Other (comment);Reading;Sitting    How long can you sit comfortably? Increased pain if performing deskwork for > 1/2 day.    How long can you stand comfortably? n/a    How long can you walk comfortably? n/a    Diagnostic tests MRI prior to surgery    Patient Stated Goals To be painfree and work without pain or restrictions as well as sleep better.    Currently in Pain? Yes    Pain Score 3     Pain Location Neck    Pain Orientation Right;Left    Pain Descriptors / Indicators Aching    Pain Type Chronic pain    Pain Onset More than a month ago    Pain Frequency Intermittent  Treatment:    Mobilization to C3-4 with movement: patient extending cervical spine over PT fingers 12x 5 second holds Grade II CPA and UPA thoracic and cervical spine 4x 10 seconds each level J mobilization 2x30 second holds  Cervical sidebend with gentle overpressure 2x60 second holds each direction  Cervical rotation with gentle overpressure 2x 60 second holds each direction  Downslip of C2 and C3 R UPA for opening in supine and prone position x 8 minutes total grade II  distraction of cervical spine manually 3x45 second holds RUE median nerve glide 10x with overpressure RTB Y overhead raises 10x 5 second holds RTB ER 10x  with cues for scapular retraction  Swiss ball forward trunk rollout 10x 10 second holds Wall posture with chin tucks Wall posture with overhead PVC pipe raise with chin tuck 10x; 5 second holds  Chin  tuck with overhead band raise Cervical rotation with opposite scapular depression 5x30 seconds Cervical extension with scapular retraction 10x 10 second holds    Pt educated throughout session about proper posture and technique with exercises. Improved exercise technique, movement at target joints, use of target muscles after min to mod verbal, visual, tactile cues.     Trigger Point Dry Needling (TDN), unbilled Education performed with patient regarding potential benefit of TDN. Reviewed precautions and risks with patient. Reviewed special precautions/risks over lung fields which include pneumothorax. Reviewed signs and symptoms of pneumothorax and advised pt to go to ER immediately if these symptoms develop advise them of dry needling treatment. Extensive time spent with pt to ensure full understanding of TDN risks. Pt provided verbal consent to treatment. TDN performed to  with 0.25 x .40 single needle placements with local twitch response (LTR) to bilateral upper traps, R suboccipitals, cervical paraspinals. Pistoning technique utilized. Improved pain-free motion following intervention. x9 minutes     Patient tolerated introduction to strengthening and self muscle tissue lengthening interventions. She does have significant weakness of postural musculature that will continue to be an area of focus to strengthen. Pain not elevated with interventions to date. She will benefit from continued skilled PT services to progress her ROM, pain relief and assist in returning patient to her previous level of function in home and at work                     PT Education - 06/16/21 0716     Education Details exercise technique, body mechanics    Person(s) Educated Patient    Methods Explanation;Demonstration;Tactile cues;Verbal cues    Comprehension Verbalized understanding;Returned demonstration;Verbal cues required;Tactile cues required              PT Short Term Goals - 04/19/21  0726       PT SHORT TERM GOAL #1   Title Pt will be independent with HEP in order to improve strength and decrease neck pain in order to improve pain-free function at home and work.    Baseline 10/6: will provide next session 11/21; HEP compliant    Time 4    Period Weeks    Status Achieved    Target Date 04/01/21               PT Long Term Goals - 06/14/21 0731       PT LONG TERM GOAL #1   Title Patient will increase FOTO score to equal to or greater than  61/100   to demonstrate statistically significant improvement in mobility and quality of life.  Baseline 10/6: 48% 11/21: 46% 1/16: 50%    Time 8    Period Weeks    Status Partially Met    Target Date 06/24/21      PT LONG TERM GOAL #2   Title Pt will demonstrate decrease in NDI by at least 19% in order to demonstrate clinically significant reduction in disability related to neck injury/pain    Baseline 10/6: will perform next session 11/21: 39% 1/16: 40%    Time 8    Period Weeks    Status On-going    Target Date 06/24/21      PT LONG TERM GOAL #3   Title Pt will decrease worst neck pain as reported on NPRS by at least 2 points (2/10) in order to demonstrate clinically significant reduction in neck pain.    Baseline 10/6: 4/10 11/21: 8/10 muscle spasm 1/16; 5/10    Time 8    Period Weeks    Status Partially Met    Target Date 06/24/21      PT LONG TERM GOAL #4   Title Patient will demonstrate improved Cervical ROM by 50% in order to improve her functional ability to turn her head or nod for return of functional capabilities including driving, working, and sleeping.    Baseline 10/6: see note 11/21: see note 1/16: see note    Time 8    Period Weeks    Status Partially Met    Target Date 06/24/21      PT LONG TERM GOAL #5   Title Patient will demonstrate decreased frequency of higher pain episodes to improve quality of life and demonstrate improved self maintanance.    Baseline 12/1: painful everyday since  recent diagnosis 1/16: slight decrease, able to sleep in bed for past three days    Time 8    Period Weeks    Status Partially Met    Target Date 06/24/21                   Plan - 06/16/21 0752     Clinical Impression Statement Patient tolerated introduction to strengthening and self muscle tissue lengthening interventions. She does have significant weakness of postural musculature that will continue to be an area of focus to strengthen. Pain not elevated with interventions to date. She will benefit from continued skilled PT services to progress her ROM, pain relief and assist in returning patient to her previous level of function in home and at work    Personal Factors and Comorbidities Age;Comorbidity 3+;Past/Current Experience;Profession;Time since onset of injury/illness/exacerbation    Comorbidities anxiety, CAD, depression, arthritis of cervical region, gastritis, GERD, cancer, stroke, HLD, HTN, IB, lumbar disc disease (fusion x2 ), migraine, numbness of L hand, headaches, and restless leg syndrome    Examination-Activity Limitations Bathing;Bed Mobility;Caring for Others;Carry;Dressing;Lift;Reach Overhead;Sleep    Examination-Participation Restrictions Cleaning;Community Activity;Driving;Laundry;Occupation;Yard Work;Meal Prep    Stability/Clinical Decision Making Evolving/Moderate complexity    Rehab Potential Good    PT Frequency 2x / week    PT Duration 8 weeks    PT Treatment/Interventions ADLs/Self Care Home Management;Cryotherapy;Electrical Stimulation;Moist Heat;Functional mobility training;Therapeutic activities;Therapeutic exercise;Patient/family education;Manual techniques;Passive range of motion;Dry needling;Taping;Spinal Manipulations;Joint Manipulations;Traction;Ultrasound;Gait training;Stair training;Energy conservation;Visual/perceptual remediation/compensation    PT Home Exercise Plan 04/02/2021= Access Code: [email or web address]    Consulted and Agree with Plan of Care Patient              Patient will benefit from skilled therapeutic intervention in order to improve the following deficits and impairments:  Decreased  activity tolerance, Decreased endurance, Decreased mobility, Decreased range of motion, Decreased strength, Hypomobility, Impaired flexibility, Increased muscle spasms, Impaired UE functional use, Postural dysfunction, Improper body mechanics, Pain  Visit Diagnosis: Cervicalgia  Muscle weakness (generalized)  Abnormal posture     Problem List Patient Active Problem List   Diagnosis Date Noted   PFO (patent foramen ovale)    CAD (coronary artery disease), native coronary artery    Acute CVA (cerebrovascular accident) ([place]) 06/15/2018   Depression 10/16/2017   Migraine 10/16/2017   Hyperglycemia, unspecified 07/10/2017   Hyperglycemia 07/10/2017   H/O dizziness 08/02/2016   Cervical disc disorder with radiculopathy of cervical region 04/12/2016   Nonallopathic lesion of thoracic region 04/12/2016   Nonallopathic lesion of rib cage 04/12/2016   Facet arthritis of cervical region 03/21/2016   Hyperlipidemia 01/26/2015   Labral tear of shoulder, right, subsequent encounter 12/04/2014   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

## 2021-06-21 ENCOUNTER — Other Ambulatory Visit: Payer: Self-pay

## 2021-06-21 ENCOUNTER — Ambulatory Visit: Payer: 59

## 2021-06-21 DIAGNOSIS — M6281 Muscle weakness (generalized): Secondary | ICD-10-CM | POA: Diagnosis not present

## 2021-06-21 DIAGNOSIS — R293 Abnormal posture: Secondary | ICD-10-CM | POA: Diagnosis not present

## 2021-06-21 DIAGNOSIS — M542 Cervicalgia: Secondary | ICD-10-CM | POA: Diagnosis not present

## 2021-06-21 NOTE — Therapy (Signed)
[place] MAIN [facility] SERVICES [address] [place], [place], [address] Phone: [phone]   Fax:  [phone]  Physical Therapy Treatment  Patient Details  Name: Lori Morales MRN: [id number] Date of Birth: 1960/12/08 Referring Provider (PT): Tama High III MD   Encounter Date: 06/21/2021   PT End of Session - 06/21/21 0750     Visit Number 22    Number of Visits 29    Date for PT Re-Evaluation 06/24/21    Authorization Type 2/10 PN 1/16    PT Start Time 0715    PT Stop Time 0758    PT Time Calculation (min) 43 min    Activity Tolerance Patient tolerated treatment well    Behavior During Therapy [facility] for tasks assessed/performed             Past Medical History:  Diagnosis Date   Arthritis    CAD (coronary artery disease), native coronary artery    mild non calcified plaque in mid LAD and prox LCx on coronary CTA 01/2019   Depression    GERD (gastroesophageal reflux disease)    Headache    migraines   Hx of squamous cell carcinoma 07/11/2016   Right distal dorsum lat forearm    Hypertension    IBS (irritable bowel syndrome)    Palpitations    PFO (patent foramen ovale)    small by cardiac CTA 01/2019   PONV (postoperative nausea and vomiting)    Restless legs syndrome    Squamous cell carcinoma of skin 12/202/2017   right distal dorsum lateral forearm    Past Surgical History:  Procedure Laterality Date   ABDOMINOPLASTY     BACK SURGERY     1 laminectomy, 1 fusion   COLONOSCOPY     COLONOSCOPY WITH PROPOFOL N/A 01/17/2017   Procedure: COLONOSCOPY WITH PROPOFOL;  Surgeon: Lollie Sails, MD;  Location: [facility] ENDOSCOPY;  Service: Endoscopy;  Laterality: N/A;   WISDOM TOOTH EXTRACTION      There were no vitals filed for this visit.   Subjective Assessment - 06/21/21 0748     Subjective Patient is getting her facet injections tomorrow. Has been packing up getting ready to work.    Pertinent History Patient is  returning back to physical therapy for her neck. Patient has a history of C6-7 arthroplasty and has been treated by this clinician before. PMH includes anxiety, CAD, depression, arthritis of cervical region, gastritis, GERD, cancer, stroke, HLD, HTN, IB, lumbar disc disease (fusion x2 ), migraine, numbness of L hand, headaches, and restless leg syndrome. R sided neck pain with L sided tightness, but no radiating to the arm.    Limitations Lifting;Writing;House hold activities;Other (comment);Reading;Sitting    How long can you sit comfortably? Increased pain if performing deskwork for > 1/2 day.    How long can you stand comfortably? n/a    How long can you walk comfortably? n/a    Diagnostic tests MRI prior to surgery    Patient Stated Goals To be painfree and work without pain or restrictions as well as sleep better.    Currently in Pain? Yes    Pain Score 2     Pain Location Neck    Pain Orientation Right;Left    Pain Descriptors / Indicators Aching    Pain Type Chronic pain    Pain Onset More than a month ago    Pain Frequency Intermittent  Treatment:    Mobilization to C3-4 with movement: patient extending cervical spine over PT fingers 12x 5 second holds Grade II CPA and UPA thoracic and cervical spine 4x 10 seconds each level J mobilization 2x30 second holds  Cervical sidebend with gentle overpressure 2x60 second holds each direction  Cervical rotation with gentle overpressure 2x 60 second holds each direction  Downslip of C2 and C3 R UPA for opening in supine and prone position x 8 minutes total grade II  R and L shoulder Gh Grade II mobilizations x 3 minutes Median nerve glides RUE 10x; ulnar nerve glide RUE distraction of cervical spine manually 3x45 second holds RTB Y overhead raises 10x 5 second holds RTB ER 10x  with cues for scapular retraction  Wall posture with chin tucks Wall posture with overhead PVC pipe raise with chin tuck 10x; 5 second  holds Cable row 12.5 with dual handle: 10x with 5 second holds of scapular squeeze  Cervical rotation with opposite scapular depression 10x5 seconds Cervical extension with scapular retraction 10x 10 second holds    Pt educated throughout session about proper posture and technique with exercises. Improved exercise technique, movement at target joints, use of target muscles after min to mod verbal, visual, tactile cues.     Trigger Point Dry Needling (TDN), unbilled Education performed with patient regarding potential benefit of TDN. Reviewed precautions and risks with patient. Reviewed special precautions/risks over lung fields which include pneumothorax. Reviewed signs and symptoms of pneumothorax and advised pt to go to ER immediately if these symptoms develop advise them of dry needling treatment. Extensive time spent with pt to ensure full understanding of TDN risks. Pt provided verbal consent to treatment. TDN performed to  with 0.25 x .40 single needle placements with local twitch response (LTR) to bilateral upper traps, R suboccipitals, cervical paraspinals. Pistoning technique utilized. Improved pain-free motion following intervention. x9 minutes     Patient has decreased trigger points and improved postural strengthening. She does have fatigue in shoulder and stabilizer musculature with prolonged holds indicating area for continued strengthening. Carryover from previous sessions noted. Patient is aware that next session will involve assessment. She will benefit from continued skilled PT services to progress her ROM, pain relief and assist in returning patient to her previous level of function in home and at work                      PT Education - 06/21/21 0749     Education Details exercise technique, posture    Person(s) Educated Patient    Methods Explanation;Demonstration;Tactile cues;Verbal cues    Comprehension Verbalized understanding;Returned  demonstration;Verbal cues required;Tactile cues required              PT Short Term Goals - 04/19/21 0726       PT SHORT TERM GOAL #1   Title Pt will be independent with HEP in order to improve strength and decrease neck pain in order to improve pain-free function at home and work.    Baseline 10/6: will provide next session 11/21; HEP compliant    Time 4    Period Weeks    Status Achieved    Target Date 04/01/21               PT Long Term Goals - 06/14/21 0731       PT LONG TERM GOAL #1   Title Patient will increase FOTO score to equal to or greater than  61/100  to demonstrate statistically significant improvement in mobility and quality of life.    Baseline 10/6: 48% 11/21: 46% 1/16: 50%    Time 8    Period Weeks    Status Partially Met    Target Date 06/24/21      PT LONG TERM GOAL #2   Title Pt will demonstrate decrease in NDI by at least 19% in order to demonstrate clinically significant reduction in disability related to neck injury/pain    Baseline 10/6: will perform next session 11/21: 39% 1/16: 40%    Time 8    Period Weeks    Status On-going    Target Date 06/24/21      PT LONG TERM GOAL #3   Title Pt will decrease worst neck pain as reported on NPRS by at least 2 points (2/10) in order to demonstrate clinically significant reduction in neck pain.    Baseline 10/6: 4/10 11/21: 8/10 muscle spasm 1/16; 5/10    Time 8    Period Weeks    Status Partially Met    Target Date 06/24/21      PT LONG TERM GOAL #4   Title Patient will demonstrate improved Cervical ROM by 50% in order to improve her functional ability to turn her head or nod for return of functional capabilities including driving, working, and sleeping.    Baseline 10/6: see note 11/21: see note 1/16: see note    Time 8    Period Weeks    Status Partially Met    Target Date 06/24/21      PT LONG TERM GOAL #5   Title Patient will demonstrate decreased frequency of higher pain episodes to  improve quality of life and demonstrate improved self maintanance.    Baseline 12/1: painful everyday since recent diagnosis 1/16: slight decrease, able to sleep in bed for past three days    Time 8    Period Weeks    Status Partially Met    Target Date 06/24/21                   Plan - 06/21/21 0805     Clinical Impression Statement Patient has decreased trigger points and improved postural strengthening. She does have fatigue in shoulder and stabilizer musculature with prolonged holds indicating area for continued strengthening. Carryover from previous sessions noted. Patient is aware that next session will involve assessment. She will benefit from continued skilled PT services to progress her ROM, pain relief and assist in returning patient to her previous level of function in home and at work    Personal Factors and Comorbidities Age;Comorbidity 3+;Past/Current Experience;Profession;Time since onset of injury/illness/exacerbation    Comorbidities anxiety, CAD, depression, arthritis of cervical region, gastritis, GERD, cancer, stroke, HLD, HTN, IB, lumbar disc disease (fusion x2 ), migraine, numbness of L hand, headaches, and restless leg syndrome    Examination-Activity Limitations Bathing;Bed Mobility;Caring for Others;Carry;Dressing;Lift;Reach Overhead;Sleep    Examination-Participation Restrictions Cleaning;Community Activity;Driving;Laundry;Occupation;Yard Work;Meal Prep    Stability/Clinical Decision Making Evolving/Moderate complexity    Rehab Potential Good    PT Frequency 2x / week    PT Duration 8 weeks    PT Treatment/Interventions ADLs/Self Care Home Management;Cryotherapy;Electrical Stimulation;Moist Heat;Functional mobility training;Therapeutic activities;Therapeutic exercise;Patient/family education;Manual techniques;Passive range of motion;Dry needling;Taping;Spinal Manipulations;Joint Manipulations;Traction;Ultrasound;Gait training;Stair training;Energy  conservation;Visual/perceptual remediation/compensation    PT Home Exercise Plan 04/02/2021= Access Code: [email or web address]    Consulted and Agree with Plan of Care Patient             Patient  will benefit from skilled therapeutic intervention in order to improve the following deficits and impairments:  Decreased activity tolerance, Decreased endurance, Decreased mobility, Decreased range of motion, Decreased strength, Hypomobility, Impaired flexibility, Increased muscle spasms, Impaired UE functional use, Postural dysfunction, Improper body mechanics, Pain  Visit Diagnosis: Cervicalgia  Muscle weakness (generalized)  Abnormal posture     Problem List Patient Active Problem List   Diagnosis Date Noted   PFO (patent foramen ovale)    CAD (coronary artery disease), native coronary artery    Acute CVA (cerebrovascular accident) ([place]) 06/15/2018   Depression 10/16/2017   Migraine 10/16/2017   Hyperglycemia, unspecified 07/10/2017   Hyperglycemia 07/10/2017   H/O dizziness 08/02/2016   Cervical disc disorder with radiculopathy of cervical region 04/12/2016   Nonallopathic lesion of thoracic region 04/12/2016   Nonallopathic lesion of rib cage 04/12/2016   Facet arthritis of cervical region 03/21/2016   Hyperlipidemia 01/26/2015   Labral tear of shoulder, right, subsequent encounter 12/04/2014   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 [place], [place], [address] Phone: [phone]   Fax:  [phone]  Name: KYLENA MOLE MRN: [id number] Date of Birth: 10-26-1960

## 2021-06-22 ENCOUNTER — Ambulatory Visit
Admission: RE | Admit: 2021-06-22 | Discharge: 2021-06-22 | Disposition: A | Discharge status: Home / Self Care | Payer: 59 | Source: Ambulatory Visit | Attending: Neurological Surgery | Admitting: Neurological Surgery

## 2021-06-22 DIAGNOSIS — M47812 Spondylosis without myelopathy or radiculopathy, cervical region: Secondary | ICD-10-CM | POA: Diagnosis not present

## 2021-06-22 MED ORDER — DEXAMETHASONE SODIUM PHOSPHATE 4 MG/ML IJ SOLN
6.0000 mg | Freq: Once | INTRAMUSCULAR | Status: AC
  Administered 2021-06-22: 6 mg via INTRA_ARTICULAR

## 2021-06-22 MED ORDER — IOPAMIDOL (ISOVUE-M 300) INJECTION 61%
1.0000 mL | Freq: Once | INTRAMUSCULAR | Status: AC | PRN
  Administered 2021-06-22: 1 mL via INTRA_ARTICULAR

## 2021-06-22 NOTE — Discharge Instructions (Signed)

## 2021-06-23 ENCOUNTER — Ambulatory Visit: Payer: 59

## 2021-06-28 ENCOUNTER — Ambulatory Visit: Payer: 59

## 2021-06-28 ENCOUNTER — Other Ambulatory Visit: Payer: Self-pay

## 2021-06-28 DIAGNOSIS — M542 Cervicalgia: Secondary | ICD-10-CM

## 2021-06-28 DIAGNOSIS — M6281 Muscle weakness (generalized): Secondary | ICD-10-CM | POA: Diagnosis not present

## 2021-06-28 DIAGNOSIS — R293 Abnormal posture: Secondary | ICD-10-CM | POA: Diagnosis not present

## 2021-06-28 NOTE — Addendum Note (Signed)
Addended by: Judene Companion on: 06/28/2021 09:41 AM   Modules accepted: Orders

## 2021-06-28 NOTE — Therapy (Addendum)
[place] MAIN [facility] SERVICES [address] [place], [place], [address] Phone: [phone]   Fax:  [phone]  Physical Therapy Treatment/RECERT  Patient Details  Name: Lori Morales MRN: [id number] Date of Birth: 1960/07/10 Referring Provider (PT): Tama High III MD   Encounter Date: 06/28/2021   PT End of Session - 06/28/21 0804     Visit Number 23    Number of Visits 29    Date for PT Re-Evaluation 08/23/21    Authorization Type 3/10 PN 1/16    PT Start Time 0717    PT Stop Time 0759    PT Time Calculation (min) 42 min    Activity Tolerance Patient tolerated treatment well    Behavior During Therapy [facility] for tasks assessed/performed             Past Medical History:  Diagnosis Date   Arthritis    CAD (coronary artery disease), native coronary artery    mild non calcified plaque in mid LAD and prox LCx on coronary CTA 01/2019   Depression    GERD (gastroesophageal reflux disease)    Headache    migraines   Hx of squamous cell carcinoma 07/11/2016   Right distal dorsum lat forearm    Hypertension    IBS (irritable bowel syndrome)    Palpitations    PFO (patent foramen ovale)    small by cardiac CTA 01/2019   PONV (postoperative nausea and vomiting)    Restless legs syndrome    Squamous cell carcinoma of skin 12/202/2017   right distal dorsum lateral forearm    Past Surgical History:  Procedure Laterality Date   ABDOMINOPLASTY     BACK SURGERY     1 laminectomy, 1 fusion   COLONOSCOPY     COLONOSCOPY WITH PROPOFOL N/A 01/17/2017   Procedure: COLONOSCOPY WITH PROPOFOL;  Surgeon: Lollie Sails, MD;  Location: [facility] ENDOSCOPY;  Service: Endoscopy;  Laterality: N/A;   WISDOM TOOTH EXTRACTION      There were no vitals filed for this visit.   Subjective Assessment - 06/28/21 0801     Subjective Patient received an injection last week. Has noted a 75% improvement since starting therapy, has been compliant with  HEP. 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    Pertinent History Patient is returning back to physical therapy for her neck. Patient has a history of C6-7 arthroplasty and has been treated by this clinician before. PMH includes anxiety, CAD, depression, arthritis of cervical region, gastritis, GERD, cancer, stroke, HLD, HTN, IB, lumbar disc disease (fusion x2 ), migraine, numbness of L hand, headaches, and restless leg syndrome. R sided neck pain with L sided tightness, but no radiating to the arm.    Limitations Lifting;Writing;House hold activities;Other (comment);Reading;Sitting    How long can you sit comfortably? Increased pain if performing deskwork for > 1/2 day.    How long can you stand comfortably? n/a    How long can you walk comfortably? n/a    Diagnostic tests MRI prior to surgery    Patient Stated Goals To be painfree and work without pain or restrictions as well as sleep better.    Currently in Pain? Yes    Pain Score 2     Pain Location Neck    Pain Orientation Right;Left    Pain Descriptors / Indicators Aching    Pain Type Chronic pain    Pain Onset More than a month  ago    Pain Frequency Intermittent                Pain today is a 7/85   RECERT or discharge FOTO 58% NDI: 28%  VAS: 3/10  Cervical ROM    Right Left  Flexion 45  Extension 44  Side Bending 26 35  Rotation 60 71    Frequency of higher pain: 2-3x/week     Treatment:    Mobilization to C3-4 with movement: patient extending cervical spine over PT fingers 12x 5 second holds Grade II CPA and UPA thoracic and cervical spine 4x 10 seconds each level J mobilization 2x30 second holds  Cervical sidebend with gentle overpressure 2x60 second holds each direction  Cervical rotation with gentle overpressure 2x 60 second holds each direction  Downslip of C2 and C3 R UPA for opening in supine and prone position x 8 minutes total grade II  distraction of cervical spine manually  3x45 second holds RTB Y overhead raises 10x 5 second holds RTB ER 10x  with cues for scapular retraction  RTB straight arm lat pull down 10x 5 second holds RTB row 10x with 5 second holds of scapular squeeze  Countertop L stretch 5x 10 second holds   Pt educated throughout session about proper posture and technique with exercises. Improved exercise technique, movement at target joints, use of target muscles after min to mod verbal, visual, tactile cues.     Trigger Point Dry Needling (TDN), unbilled Education performed with patient regarding potential benefit of TDN. Reviewed precautions and risks with patient. Reviewed special precautions/risks over lung fields which include pneumothorax. Reviewed signs and symptoms of pneumothorax and advised pt to go to ER immediately if these symptoms develop advise them of dry needling treatment. Extensive time spent with pt to ensure full understanding of TDN risks. Pt provided verbal consent to treatment. TDN performed to  with 0.25 x .40 single needle placements with local twitch response (LTR) to bilateral upper traps, R suboccipitals, cervical paraspinals. Pistoning technique utilized. Improved pain-free motion following intervention. x9 minutes      Patient reports her neck is better, feels 75% better. Will decrease frequency to 1x/week. Patient has made significant progress towards her goals at this time with decreased pain, decreased frequency of pain, and improved ROM.  New goal for return to quilting added to [place]. She will benefit from continued skilled PT services to progress her ROM, pain relief and assist in returning patient to her previous level of function in home and at work                  PT Education - 06/28/21 0803     Education Details exercise technique, body mechanics, goals, recert    Person(s) Educated Patient    Methods Explanation;Demonstration;Tactile cues;Verbal cues    Comprehension Verbalized  understanding;Returned demonstration;Verbal cues required;Tactile cues required              PT Short Term Goals - 04/19/21 0726       PT SHORT TERM GOAL #1   Title Pt will be independent with HEP in order to improve strength and decrease neck pain in order to improve pain-free function at home and work.    Baseline 10/6: will provide next session 11/21; HEP compliant    Time 4    Period Weeks    Status Achieved    Target Date 04/01/21  PT Long Term Goals - 06/28/21 0805       PT LONG TERM GOAL #1   Title Patient will increase FOTO score to equal to or greater than  61/100   to demonstrate statistically significant improvement in mobility and quality of life.    Baseline 10/6: 48% 11/21: 46% 1/16: 50% 1/30: 58%    Time 8    Period Weeks    Status Partially Met    Target Date 08/23/21      PT LONG TERM GOAL #2   Title Pt will demonstrate decrease in NDI by at least 19% in order to demonstrate clinically significant reduction in disability related to neck injury/pain    Baseline 10/6: will perform next session 11/21: 39% 1/16: 40% 1/30: 28%    Time 8    Period Weeks    Status Partially Met    Target Date 08/23/21      PT LONG TERM GOAL #3   Title Pt will decrease worst neck pain as reported on NPRS by at least 2 points (2/10) in order to demonstrate clinically significant reduction in neck pain.    Baseline 10/6: 4/10 11/21: 8/10 muscle spasm 1/16; 5/10 1/30: 3/10    Time 8    Period Weeks    Status Partially Met    Target Date 08/23/21      PT LONG TERM GOAL #4   Title Patient will demonstrate improved Cervical ROM by 50% in order to improve her functional ability to turn her head or nod for return of functional capabilities including driving, working, and sleeping.    Baseline 10/6: see note 11/21: see note 1/16: see note 1/30: see note    Time 8    Period Weeks    Status Partially Met    Target Date 08/23/21      PT LONG TERM GOAL #5   Title  Patient will demonstrate decreased frequency of higher pain episodes to improve quality of life and demonstrate improved self maintanance.    Baseline 12/1: painful everyday since recent diagnosis 1/16: slight decrease, able to sleep in bed for past three days 1/30: 2-3 x/week of higher pain    Time 8    Period Weeks    Status Partially Met    Target Date 08/23/21      Additional Long Term Goals   Additional Long Term Goals Yes      PT LONG TERM GOAL #6   Title Patient will return to leisure activities including [place] for return to PLOF.    Baseline 1/30: not able to quilt yet    Time 8    Period Weeks    Status New    Target Date 08/23/21                   Plan - 06/28/21 3545     Clinical Impression Statement Patient reports her neck is better, feels 75% better. Will decrease frequency to 1x/week. Patient has made significant progress towards her goals at this time with decreased pain, decreased frequency of pain, and improved ROM.  New goal for return to quilting added to [place]. She will benefit from continued skilled PT services to progress her ROM, pain relief and assist in returning patient to her previous level of function in home and at work    Personal Factors and Comorbidities Age;Comorbidity 3+;Past/Current Experience;Profession;Time since onset of injury/illness/exacerbation    Comorbidities anxiety, CAD, depression, arthritis of cervical region, gastritis, GERD, cancer, stroke, HLD,  HTN, IB, lumbar disc disease (fusion x2 ), migraine, numbness of L hand, headaches, and restless leg syndrome    Examination-Activity Limitations Bathing;Bed Mobility;Caring for Others;Carry;Dressing;Lift;Reach Overhead;Sleep    Examination-Participation Restrictions Cleaning;Community Activity;Driving;Laundry;Occupation;Yard Work;Meal Prep    Stability/Clinical Decision Making Evolving/Moderate complexity    Rehab Potential Good    PT Frequency 1x / week    PT Duration 8 weeks    PT  Treatment/Interventions ADLs/Self Care Home Management;Cryotherapy;Electrical Stimulation;Moist Heat;Functional mobility training;Therapeutic activities;Therapeutic exercise;Patient/family education;Manual techniques;Passive range of motion;Dry needling;Taping;Spinal Manipulations;Joint Manipulations;Traction;Ultrasound;Gait training;Stair training;Energy conservation;Visual/perceptual remediation/compensation    PT Home Exercise Plan 04/02/2021= Access Code: [email or web address]    Consulted and Agree with Plan of Care Patient             Patient will benefit from skilled therapeutic intervention in order to improve the following deficits and impairments:  Decreased activity tolerance, Decreased endurance, Decreased mobility, Decreased range of motion, Decreased strength, Hypomobility, Impaired flexibility, Increased muscle spasms, Impaired UE functional use, Postural dysfunction, Improper body mechanics, Pain  Visit Diagnosis: Cervicalgia  Muscle weakness (generalized)  Abnormal posture     Problem List Patient Active Problem List   Diagnosis Date Noted   PFO (patent foramen ovale)    CAD (coronary artery disease), native coronary artery    Acute CVA (cerebrovascular accident) ([place]) 06/15/2018   Depression 10/16/2017   Migraine 10/16/2017   Hyperglycemia, unspecified 07/10/2017   Hyperglycemia 07/10/2017   H/O dizziness 08/02/2016   Cervical disc disorder with radiculopathy of cervical region 04/12/2016   Nonallopathic lesion of thoracic region 04/12/2016   Nonallopathic lesion of rib cage 04/12/2016   Facet arthritis of cervical region 03/21/2016   Hyperlipidemia 01/26/2015   Labral tear of shoulder, right, subsequent encounter 12/04/2014   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 [place], [place], [address] Phone: [phone]   Fax:  [phone]  Name: Lori Morales MRN: [id number] Date of Birth: 11-Nov-1960

## 2021-06-29 ENCOUNTER — Other Ambulatory Visit: Payer: Self-pay | Admitting: Internal Medicine

## 2021-06-29 ENCOUNTER — Other Ambulatory Visit: Payer: Self-pay

## 2021-06-29 ENCOUNTER — Other Ambulatory Visit: Payer: 59

## 2021-06-30 ENCOUNTER — Ambulatory Visit: Payer: 59

## 2021-07-05 ENCOUNTER — Ambulatory Visit: Payer: 59

## 2021-07-05 ENCOUNTER — Other Ambulatory Visit: Payer: Self-pay

## 2021-07-07 ENCOUNTER — Ambulatory Visit: Payer: 59 | Attending: Internal Medicine

## 2021-07-07 ENCOUNTER — Other Ambulatory Visit: Payer: Self-pay

## 2021-07-07 DIAGNOSIS — M6281 Muscle weakness (generalized): Secondary | ICD-10-CM | POA: Insufficient documentation

## 2021-07-07 DIAGNOSIS — R293 Abnormal posture: Secondary | ICD-10-CM | POA: Diagnosis not present

## 2021-07-07 DIAGNOSIS — M542 Cervicalgia: Secondary | ICD-10-CM | POA: Diagnosis not present

## 2021-07-07 NOTE — Therapy (Signed)
[place] MAIN [facility] SERVICES [address] [place], [place], [address] Phone: [phone]   Fax:  [phone]  Physical Therapy Treatment  Patient Details  Name: Lori Morales MRN: [id number] Date of Birth: June 01, 1960 Referring Provider (PT): Tama High III MD   Encounter Date: 07/07/2021   PT End of Session - 07/07/21 0716     Visit Number 24    Number of Visits 29    Date for PT Re-Evaluation 08/23/21    Authorization Type 4/10 PN 1/16    PT Start Time 0715    PT Stop Time 0759    PT Time Calculation (min) 44 min    Activity Tolerance Patient tolerated treatment well    Behavior During Therapy [facility] for tasks assessed/performed             Past Medical History:  Diagnosis Date   Arthritis    CAD (coronary artery disease), native coronary artery    mild non calcified plaque in mid LAD and prox LCx on coronary CTA 01/2019   Depression    GERD (gastroesophageal reflux disease)    Headache    migraines   Hx of squamous cell carcinoma 07/11/2016   Right distal dorsum lat forearm    Hypertension    IBS (irritable bowel syndrome)    Palpitations    PFO (patent foramen ovale)    small by cardiac CTA 01/2019   PONV (postoperative nausea and vomiting)    Restless legs syndrome    Squamous cell carcinoma of skin 12/202/2017   right distal dorsum lateral forearm    Past Surgical History:  Procedure Laterality Date   ABDOMINOPLASTY     BACK SURGERY     1 laminectomy, 1 fusion   COLONOSCOPY     COLONOSCOPY WITH PROPOFOL N/A 01/17/2017   Procedure: COLONOSCOPY WITH PROPOFOL;  Surgeon: Lollie Sails, MD;  Location: [facility] ENDOSCOPY;  Service: Endoscopy;  Laterality: N/A;   WISDOM TOOTH EXTRACTION      There were no vitals filed for this visit.   Subjective Assessment - 07/07/21 0724     Subjective Patient was able to start attempting sewing on saturday and sunday for half hour each. Is noticing discomfort in L side more  than R but still maneagable pain levels.    Pertinent History Patient is returning back to physical therapy for her neck. Patient has a history of C6-7 arthroplasty and has been treated by this clinician before. PMH includes anxiety, CAD, depression, arthritis of cervical region, gastritis, GERD, cancer, stroke, HLD, HTN, IB, lumbar disc disease (fusion x2 ), migraine, numbness of L hand, headaches, and restless leg syndrome. R sided neck pain with L sided tightness, but no radiating to the arm.    Limitations Lifting;Writing;House hold activities;Other (comment);Reading;Sitting    How long can you sit comfortably? Increased pain if performing deskwork for > 1/2 day.    How long can you stand comfortably? n/a    How long can you walk comfortably? n/a    Diagnostic tests MRI prior to surgery    Patient Stated Goals To be painfree and work without pain or restrictions as well as sleep better.    Currently in Pain? Yes    Pain Score 2     Pain Location Neck    Pain Orientation Right;Left    Pain Descriptors / Indicators Aching    Pain Type Chronic pain    Pain Onset More than a month ago  Pain Frequency Intermittent                  Treatment:    Mobilization to C3-4 with movement: patient extending cervical spine over PT fingers 12x 5 second holds Grade II CPA and UPA thoracic and cervical spine 4x 10 seconds each level J mobilization 2x30 second holds  Cervical sidebend with gentle overpressure 2x60 second holds each direction  Cervical rotation with gentle overpressure 2x 60 second holds each direction  Downslip of C2 and C3 R UPA for opening in supine and prone position x 8 minutes total grade II  R and L shoulder Gh Grade II mobilizations x 3 minutes Median nerve glides LUE 10x; ulnar nerve glide RUE distraction of cervical spine manually 3x45 second holds RTB Y overhead raises 10x 5 second holds RTB ER 10x  with cues for scapular retraction  Wall posture with chin  tucks Wall posture with overhead PVC pipe raise with chin tuck 10x; 5 second holds  Cervical rotation with opposite scapular depression 10x5 seconds Cervical extension with scapular retraction 10x 10 second holds    Pt educated throughout session about proper posture and technique with exercises. Improved exercise technique, movement at target joints, use of target muscles after min to mod verbal, visual, tactile cues.     Trigger Point Dry Needling (TDN), unbilled Education performed with patient regarding potential benefit of TDN. Reviewed precautions and risks with patient. Reviewed special precautions/risks over lung fields which include pneumothorax. Reviewed signs and symptoms of pneumothorax and advised pt to go to ER immediately if these symptoms develop advise them of dry needling treatment. Extensive time spent with pt to ensure full understanding of TDN risks. Pt provided verbal consent to treatment. TDN performed to  with 0.25 x .40 single needle placements with local twitch response (LTR) to bilateral upper traps, R suboccipitals, cervical paraspinals. Pistoning technique utilized. Improved pain-free motion following intervention. x9 minutes     Patient tolerates session well with excellent motivation. Pain has switched from right side to left side with patient tolerating interventions well. Multiple trigger points released bilaterally. Postural musculature is improving with decreased episodes of fatigue. At this time will continue POC of 1x/week. She will benefit from continued skilled PT services to progress her ROM, pain relief and assist in returning patient to her previous level of function in home and at work                       PT Education - 07/07/21 0723     Education Details exercise technique, body mechanics, TDN    Person(s) Educated Patient    Methods Explanation;Demonstration;Tactile cues;Verbal cues    Comprehension Verbalized  understanding;Returned demonstration;Verbal cues required              PT Short Term Goals - 04/19/21 0726       PT SHORT TERM GOAL #1   Title Pt will be independent with HEP in order to improve strength and decrease neck pain in order to improve pain-free function at home and work.    Baseline 10/6: will provide next session 11/21; HEP compliant    Time 4    Period Weeks    Status Achieved    Target Date 04/01/21               PT Long Term Goals - 06/28/21 0805       PT LONG TERM GOAL #1   Title Patient will increase FOTO  score to equal to or greater than  61/100   to demonstrate statistically significant improvement in mobility and quality of life.    Baseline 10/6: 48% 11/21: 46% 1/16: 50% 1/30: 58%    Time 8    Period Weeks    Status Partially Met    Target Date 08/23/21      PT LONG TERM GOAL #2   Title Pt will demonstrate decrease in NDI by at least 19% in order to demonstrate clinically significant reduction in disability related to neck injury/pain    Baseline 10/6: will perform next session 11/21: 39% 1/16: 40% 1/30: 28%    Time 8    Period Weeks    Status Partially Met    Target Date 08/23/21      PT LONG TERM GOAL #3   Title Pt will decrease worst neck pain as reported on NPRS by at least 2 points (2/10) in order to demonstrate clinically significant reduction in neck pain.    Baseline 10/6: 4/10 11/21: 8/10 muscle spasm 1/16; 5/10 1/30: 3/10    Time 8    Period Weeks    Status Partially Met    Target Date 08/23/21      PT LONG TERM GOAL #4   Title Patient will demonstrate improved Cervical ROM by 50% in order to improve her functional ability to turn her head or nod for return of functional capabilities including driving, working, and sleeping.    Baseline 10/6: see note 11/21: see note 1/16: see note 1/30: see note    Time 8    Period Weeks    Status Partially Met    Target Date 08/23/21      PT LONG TERM GOAL #5   Title Patient will  demonstrate decreased frequency of higher pain episodes to improve quality of life and demonstrate improved self maintanance.    Baseline 12/1: painful everyday since recent diagnosis 1/16: slight decrease, able to sleep in bed for past three days 1/30: 2-3 x/week of higher pain    Time 8    Period Weeks    Status Partially Met    Target Date 08/23/21      Additional Long Term Goals   Additional Long Term Goals Yes      PT LONG TERM GOAL #6   Title Patient will return to leisure activities including [place] for return to PLOF.    Baseline 1/30: not able to quilt yet    Time 8    Period Weeks    Status New    Target Date 08/23/21                   Plan - 07/07/21 0759     Clinical Impression Statement Patient tolerates session well with excellent motivation. Pain has switched from right side to left side with patient tolerating interventions well. Multiple trigger points released bilaterally. Postural musculature is improving with decreased episodes of fatigue. At this time will continue POC of 1x/week. She will benefit from continued skilled PT services to progress her ROM, pain relief and assist in returning patient to her previous level of function in home and at work    Personal Factors and Comorbidities Age;Comorbidity 3+;Past/Current Experience;Profession;Time since onset of injury/illness/exacerbation    Comorbidities anxiety, CAD, depression, arthritis of cervical region, gastritis, GERD, cancer, stroke, HLD, HTN, IB, lumbar disc disease (fusion x2 ), migraine, numbness of L hand, headaches, and restless leg syndrome    Examination-Activity Limitations Bathing;Bed Mobility;Caring for [employer]  Overhead;Sleep    Examination-Participation Restrictions Cleaning;Community Activity;Driving;Laundry;Occupation;Yard Work;Meal Prep    Stability/Clinical Decision Making Evolving/Moderate complexity    Rehab Potential Good    PT Frequency 1x / week    PT  Duration 8 weeks    PT Treatment/Interventions ADLs/Self Care Home Management;Cryotherapy;Electrical Stimulation;Moist Heat;Functional mobility training;Therapeutic activities;Therapeutic exercise;Patient/family education;Manual techniques;Passive range of motion;Dry needling;Taping;Spinal Manipulations;Joint Manipulations;Traction;Ultrasound;Gait training;Stair training;Energy conservation;Visual/perceptual remediation/compensation    PT Home Exercise Plan 04/02/2021= Access Code: [email or web address]    Consulted and Agree with Plan of Care Patient             Patient will benefit from skilled therapeutic intervention in order to improve the following deficits and impairments:  Decreased activity tolerance, Decreased endurance, Decreased mobility, Decreased range of motion, Decreased strength, Hypomobility, Impaired flexibility, Increased muscle spasms, Impaired UE functional use, Postural dysfunction, Improper body mechanics, Pain  Visit Diagnosis: Cervicalgia  Muscle weakness (generalized)  Abnormal posture     Problem List Patient Active Problem List   Diagnosis Date Noted   PFO (patent foramen ovale)    CAD (coronary artery disease), native coronary artery    Acute CVA (cerebrovascular accident) ([place]) 06/15/2018   Depression 10/16/2017   Migraine 10/16/2017   Hyperglycemia, unspecified 07/10/2017   Hyperglycemia 07/10/2017   H/O dizziness 08/02/2016   Cervical disc disorder with radiculopathy of cervical region 04/12/2016   Nonallopathic lesion of thoracic region 04/12/2016   Nonallopathic lesion of rib cage 04/12/2016   Facet arthritis of cervical region 03/21/2016   Hyperlipidemia 01/26/2015   Labral tear of shoulder, right, subsequent encounter 12/04/2014   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

## 2021-07-12 ENCOUNTER — Ambulatory Visit: Payer: 59

## 2021-07-14 ENCOUNTER — Other Ambulatory Visit: Payer: Self-pay

## 2021-07-14 ENCOUNTER — Ambulatory Visit: Payer: 59

## 2021-07-14 DIAGNOSIS — R293 Abnormal posture: Secondary | ICD-10-CM

## 2021-07-14 DIAGNOSIS — M6281 Muscle weakness (generalized): Secondary | ICD-10-CM | POA: Diagnosis not present

## 2021-07-14 DIAGNOSIS — M542 Cervicalgia: Secondary | ICD-10-CM

## 2021-07-14 NOTE — Therapy (Signed)
[place] MAIN [facility] SERVICES [address] [place], [place], [address] Phone: [phone]   Fax:  [phone]  Physical Therapy Treatment  Patient Details  Name: Lori Morales MRN: [id number] Date of Birth: [birth year] Referring Provider (PT): Tama High III MD   Encounter Date: 07/14/2021   PT End of Session - 07/14/21 0715     Visit Number 25    Number of Visits 29    Date for PT Re-Evaluation 08/23/21    Authorization Type 5/10 PN 1/16    PT Start Time 0715    PT Stop Time 0759    PT Time Calculation (min) 44 min    Activity Tolerance Patient tolerated treatment well    Behavior During Therapy [facility] for tasks assessed/performed             Past Medical History:  Diagnosis Date   Arthritis    CAD (coronary artery disease), native coronary artery    mild non calcified plaque in mid LAD and prox LCx on coronary CTA 01/2019   Depression    GERD (gastroesophageal reflux disease)    Headache    migraines   Hx of squamous cell carcinoma 07/11/2016   Right distal dorsum lat forearm    Hypertension    IBS (irritable bowel syndrome)    Palpitations    PFO (patent foramen ovale)    small by cardiac CTA 01/2019   PONV (postoperative nausea and vomiting)    Restless legs syndrome    Squamous cell carcinoma of skin 12/202/2017   right distal dorsum lateral forearm    Past Surgical History:  Procedure Laterality Date   ABDOMINOPLASTY     BACK SURGERY     1 laminectomy, 1 fusion   COLONOSCOPY     COLONOSCOPY WITH PROPOFOL N/A 01/17/2017   Procedure: COLONOSCOPY WITH PROPOFOL;  Surgeon: Lollie Sails, MD;  Location: [facility] ENDOSCOPY;  Service: Endoscopy;  Laterality: N/A;   WISDOM TOOTH EXTRACTION      There were no vitals filed for this visit.   Subjective Assessment - 07/14/21 0717     Subjective Patient sewed for 3 hours with 30 minute intervals. had increase in pain. Is going to see physician on Friday.    Pertinent  History Patient is returning back to physical therapy for her neck. Patient has a history of C6-7 arthroplasty and has been treated by this clinician before. PMH includes anxiety, CAD, depression, arthritis of cervical region, gastritis, GERD, cancer, stroke, HLD, HTN, IB, lumbar disc disease (fusion x2 ), migraine, numbness of L hand, headaches, and restless leg syndrome. R sided neck pain with L sided tightness, but no radiating to the arm.    Limitations Lifting;Writing;House hold activities;Other (comment);Reading;Sitting    How long can you sit comfortably? Increased pain if performing deskwork for > 1/2 day.    How long can you stand comfortably? n/a    How long can you walk comfortably? n/a    Diagnostic tests MRI prior to surgery    Patient Stated Goals To be painfree and work without pain or restrictions as well as sleep better.    Currently in Pain? Yes    Pain Score 2     Pain Location Neck    Pain Orientation Right;Left    Pain Descriptors / Indicators Aching    Pain Type Chronic pain    Pain Onset More than a month ago    Pain Frequency Intermittent  Treatment:    Mobilization to C3-4 with movement: patient extending cervical spine over PT fingers 12x 5 second holds Grade II CPA and UPA thoracic and cervical spine 4x 10 seconds each level J mobilization 2x30 second holds  Cervical sidebend with gentle overpressure 3x60 second holds each direction  Cervical rotation with gentle overpressure 3x 60 second holds each direction  Downslip of C2 and C3 R UPA for opening in supine and prone position x 8 minutes total grade II   I's, T's, Y's on blue swiss ball with focus on scapular retraction 10x each direction    STM with implementation of effleurage and pettrisage thoracic paraspinals and levator scap x 5 minutes    Pt educated throughout session about proper posture and technique with exercises. Improved exercise technique, movement at target  joints, use of target muscles after min to mod verbal, visual, tactile cues.     Trigger Point Dry Needling (TDN), unbilled Education performed with patient regarding potential benefit of TDN. Reviewed precautions and risks with patient. Reviewed special precautions/risks over lung fields which include pneumothorax. Reviewed signs and symptoms of pneumothorax and advised pt to go to ER immediately if these symptoms develop advise them of dry needling treatment. Extensive time spent with pt to ensure full understanding of TDN risks. Pt provided verbal consent to treatment. TDN performed to  with 0.25 x .40 single needle placements with local twitch response (LTR) to bilateral upper traps, R suboccipitals, cervical paraspinals. Pistoning technique utilized. Improved pain-free motion following intervention. x9 minutes     Patient introduced to posterior chain interventions with no pain increase. Education on decreasing time of sewing and spreading out over multiple days performed with patient verbalizing understanding to help build up endurance musculature. Multiple trigger points are found bilaterally throughout session with TDN and manual. She will benefit from continued skilled PT services to progress her ROM, pain relief and assist in returning patient to her previous level of function in home and at work                 PT Education - 07/14/21 0716     Education Details exercise technique, TDN, manual    Person(s) Educated Patient    Methods Explanation;Demonstration;Tactile cues;Verbal cues    Comprehension Need further instruction;Verbalized understanding;Returned demonstration;Verbal cues required;Tactile cues required              PT Short Term Goals - 04/19/21 0726       PT SHORT TERM GOAL #1   Title Pt will be independent with HEP in order to improve strength and decrease neck pain in order to improve pain-free function at home and work.    Baseline 10/6: will  provide next session 11/21; HEP compliant    Time 4    Period Weeks    Status Achieved    Target Date 04/01/21               PT Long Term Goals - 06/28/21 0805       PT LONG TERM GOAL #1   Title Patient will increase FOTO score to equal to or greater than  61/100   to demonstrate statistically significant improvement in mobility and quality of life.    Baseline 10/6: 48% 11/21: 46% 1/16: 50% 1/30: 58%    Time 8    Period Weeks    Status Partially Met    Target Date 08/23/21      PT LONG TERM GOAL #2   Title Pt  will demonstrate decrease in NDI by at least 19% in order to demonstrate clinically significant reduction in disability related to neck injury/pain    Baseline 10/6: will perform next session 11/21: 39% 1/16: 40% 1/30: 28%    Time 8    Period Weeks    Status Partially Met    Target Date 08/23/21      PT LONG TERM GOAL #3   Title Pt will decrease worst neck pain as reported on NPRS by at least 2 points (2/10) in order to demonstrate clinically significant reduction in neck pain.    Baseline 10/6: 4/10 11/21: 8/10 muscle spasm 1/16; 5/10 1/30: 3/10    Time 8    Period Weeks    Status Partially Met    Target Date 08/23/21      PT LONG TERM GOAL #4   Title Patient will demonstrate improved Cervical ROM by 50% in order to improve her functional ability to turn her head or nod for return of functional capabilities including driving, working, and sleeping.    Baseline 10/6: see note 11/21: see note 1/16: see note 1/30: see note    Time 8    Period Weeks    Status Partially Met    Target Date 08/23/21      PT LONG TERM GOAL #5   Title Patient will demonstrate decreased frequency of higher pain episodes to improve quality of life and demonstrate improved self maintanance.    Baseline 12/1: painful everyday since recent diagnosis 1/16: slight decrease, able to sleep in bed for past three days 1/30: 2-3 x/week of higher pain    Time 8    Period Weeks    Status  Partially Met    Target Date 08/23/21      Additional Long Term Goals   Additional Long Term Goals Yes      PT LONG TERM GOAL #6   Title Patient will return to leisure activities including [place] for return to PLOF.    Baseline 1/30: not able to quilt yet    Time 8    Period Weeks    Status New    Target Date 08/23/21                   Plan - 07/14/21 1158     Clinical Impression Statement Patient introduced to posterior chain interventions with no pain increase. Education on decreasing time of sewing and spreading out over multiple days performed with patient verbalizing understanding to help build up endurance musculature. Multiple trigger points are found bilaterally throughout session with TDN and manual. She will benefit from continued skilled PT services to progress her ROM, pain relief and assist in returning patient to her previous level of function in home and at work    Personal Factors and Comorbidities Age;Comorbidity 3+;Past/Current Experience;Profession;Time since onset of injury/illness/exacerbation    Comorbidities anxiety, CAD, depression, arthritis of cervical region, gastritis, GERD, cancer, stroke, HLD, HTN, IB, lumbar disc disease (fusion x2 ), migraine, numbness of L hand, headaches, and restless leg syndrome    Examination-Activity Limitations Bathing;Bed Mobility;Caring for Others;Carry;Dressing;Lift;Reach Overhead;Sleep    Examination-Participation Restrictions Cleaning;Community Activity;Driving;Laundry;Occupation;Yard Work;Meal Prep    Stability/Clinical Decision Making Evolving/Moderate complexity    Rehab Potential Good    PT Frequency 1x / week    PT Duration 8 weeks    PT Treatment/Interventions ADLs/Self Care Home Management;Cryotherapy;Electrical Stimulation;Moist Heat;Functional mobility training;Therapeutic activities;Therapeutic exercise;Patient/family education;Manual techniques;Passive range of motion;Dry needling;Taping;Spinal  Manipulations;Joint Manipulations;Traction;Ultrasound;Gait training;Stair training;Energy conservation;Visual/perceptual remediation/compensation  PT Home Exercise Plan 04/02/2021= Access Code: [email or web address]    Consulted and Agree with Plan of Care Patient             Patient will benefit from skilled therapeutic intervention in order to improve the following deficits and impairments:  Decreased activity tolerance, Decreased endurance, Decreased mobility, Decreased range of motion, Decreased strength, Hypomobility, Impaired flexibility, Increased muscle spasms, Impaired UE functional use, Postural dysfunction, Improper body mechanics, Pain  Visit Diagnosis: Cervicalgia  Muscle weakness (generalized)  Abnormal posture     Problem List Patient Active Problem List   Diagnosis Date Noted   PFO (patent foramen ovale)    CAD (coronary artery disease), native coronary artery    Acute CVA (cerebrovascular accident) ([place]) 06/15/2018   Depression 10/16/2017   Migraine 10/16/2017   Hyperglycemia, unspecified 07/10/2017   Hyperglycemia 07/10/2017   H/O dizziness 08/02/2016   Cervical disc disorder with radiculopathy of cervical region 04/12/2016   Nonallopathic lesion of thoracic region 04/12/2016   Nonallopathic lesion of rib cage 04/12/2016   Facet arthritis of cervical region 03/21/2016   Hyperlipidemia 01/26/2015   Labral tear of shoulder, right, subsequent encounter 12/04/2014   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 SERVICES [place], [place], [address] Phone: [phone]   Fax:   [phone]  Name: Lori Morales MRN: [id number] Date of Birth: Apr 11, 1961

## 2021-07-16 DIAGNOSIS — M47812 Spondylosis without myelopathy or radiculopathy, cervical region: Secondary | ICD-10-CM | POA: Diagnosis not present

## 2021-07-16 DIAGNOSIS — Z6829 Body mass index (BMI) 29.0-29.9, adult: Secondary | ICD-10-CM | POA: Diagnosis not present

## 2021-07-16 DIAGNOSIS — R030 Elevated blood-pressure reading, without diagnosis of hypertension: Secondary | ICD-10-CM | POA: Diagnosis not present

## 2021-07-19 ENCOUNTER — Ambulatory Visit: Payer: 59

## 2021-07-21 ENCOUNTER — Other Ambulatory Visit: Payer: Self-pay

## 2021-07-21 ENCOUNTER — Ambulatory Visit: Payer: 59

## 2021-07-21 DIAGNOSIS — R293 Abnormal posture: Secondary | ICD-10-CM

## 2021-07-21 DIAGNOSIS — M6281 Muscle weakness (generalized): Secondary | ICD-10-CM

## 2021-07-21 DIAGNOSIS — M542 Cervicalgia: Secondary | ICD-10-CM

## 2021-07-21 NOTE — Therapy (Signed)
[place] MAIN [facility] SERVICES [address] [place], [place], [address] Phone: [phone]   Fax:  [phone]  Physical Therapy Treatment  Patient Details  Name: Lori Morales MRN: [id number] Date of Birth: Oct 31, 1960 Referring Provider (PT): Tama High III MD   Encounter Date: 07/21/2021   PT End of Session - 07/21/21 0720     Visit Number 26    Number of Visits 29    Date for PT Re-Evaluation 08/23/21    Authorization Type 6/10 PN 1/16    PT Start Time 0720    PT Stop Time 0800    PT Time Calculation (min) 40 min    Activity Tolerance Patient tolerated treatment well    Behavior During Therapy [facility] for tasks assessed/performed             Past Medical History:  Diagnosis Date   Arthritis    CAD (coronary artery disease), native coronary artery    mild non calcified plaque in mid LAD and prox LCx on coronary CTA 01/2019   Depression    GERD (gastroesophageal reflux disease)    Headache    migraines   Hx of squamous cell carcinoma 07/11/2016   Right distal dorsum lat forearm    Hypertension    IBS (irritable bowel syndrome)    Palpitations    PFO (patent foramen ovale)    small by cardiac CTA 01/2019   PONV (postoperative nausea and vomiting)    Restless legs syndrome    Squamous cell carcinoma of skin 12/202/2017   right distal dorsum lateral forearm    Past Surgical History:  Procedure Laterality Date   ABDOMINOPLASTY     BACK SURGERY     1 laminectomy, 1 fusion   COLONOSCOPY     COLONOSCOPY WITH PROPOFOL N/A 01/17/2017   Procedure: COLONOSCOPY WITH PROPOFOL;  Surgeon: Lollie Sails, MD;  Location: [facility] ENDOSCOPY;  Service: Endoscopy;  Laterality: N/A;   WISDOM TOOTH EXTRACTION      There were no vitals filed for this visit.   Subjective Assessment - 07/21/21 0843     Subjective Patient is having increased stress at work, was able to sew over the weekend    Pertinent History Patient is returning back  to physical therapy for her neck. Patient has a history of C6-7 arthroplasty and has been treated by this clinician before. PMH includes anxiety, CAD, depression, arthritis of cervical region, gastritis, GERD, cancer, stroke, HLD, HTN, IB, lumbar disc disease (fusion x2 ), migraine, numbness of L hand, headaches, and restless leg syndrome. R sided neck pain with L sided tightness, but no radiating to the arm.    Limitations Lifting;Writing;House hold activities;Other (comment);Reading;Sitting    How long can you sit comfortably? Increased pain if performing deskwork for > 1/2 day.    How long can you stand comfortably? n/a    How long can you walk comfortably? n/a    Diagnostic tests MRI prior to surgery    Patient Stated Goals To be painfree and work without pain or restrictions as well as sleep better.    Currently in Pain? Yes    Pain Score 2     Pain Location Neck    Pain Orientation Right;Left    Pain Descriptors / Indicators Aching    Pain Type Chronic pain    Pain Onset More than a month ago                Treatment:  Mobilization to C3-4 with movement: patient extending cervical spine over PT fingers 12x 5 second holds Grade II CPA and UPA thoracic and cervical spine 4x 10 seconds each level J mobilization 2x30 second holds  Cervical sidebend with gentle overpressure 3x60 second holds each direction  Cervical rotation with gentle overpressure 3x 60 second holds each direction  Downslip of C2 and C3 R UPA for opening in supine and prone position x 8 minutes total grade II    I's, T's, Y's on blue swiss ball with focus on scapular retraction 10x each direction    RTB overhead raise 15x RTB ER with focus on scapular retraction 15x      Pt educated throughout session about proper posture and technique with exercises. Improved exercise technique, movement at target joints, use of target muscles after min to mod verbal, visual, tactile cues.     Trigger Point Dry Needling  (TDN), unbilled Education performed with patient regarding potential benefit of TDN. Reviewed precautions and risks with patient. Reviewed special precautions/risks over lung fields which include pneumothorax. Reviewed signs and symptoms of pneumothorax and advised pt to go to ER immediately if these symptoms develop advise them of dry needling treatment. Extensive time spent with pt to ensure full understanding of TDN risks. Pt provided verbal consent to treatment. TDN performed to  with 0.25 x .40 single needle placements with local twitch response (LTR) to bilateral upper traps, R suboccipitals, cervical paraspinals. Pistoning technique utilized. Improved pain-free motion following intervention. x9 minutes     Patient presents to physical therapy with high stress levels as can be seen in bilateral upper trap tension. Hypomobility of spine is improving however indicating area of continued progression. Postural strengthening interventions tolerated throughout session without pain increase. She will benefit from continued skilled PT services to progress her ROM, pain relief and assist in returning patient to her previous level of function in home and at work                     PT Education - 07/21/21 0720     Education Details exercise technique, TDN manual    Person(s) Educated Patient    Methods Explanation;Demonstration;Tactile cues;Verbal cues    Comprehension Verbalized understanding;Returned demonstration;Verbal cues required;Tactile cues required              PT Short Term Goals - 04/19/21 0726       PT SHORT TERM GOAL #1   Title Pt will be independent with HEP in order to improve strength and decrease neck pain in order to improve pain-free function at home and work.    Baseline 10/6: will provide next session 11/21; HEP compliant    Time 4    Period Weeks    Status Achieved    Target Date 04/01/21               PT Long Term Goals - 06/28/21 0805        PT LONG TERM GOAL #1   Title Patient will increase FOTO score to equal to or greater than  61/100   to demonstrate statistically significant improvement in mobility and quality of life.    Baseline 10/6: 48% 11/21: 46% 1/16: 50% 1/30: 58%    Time 8    Period Weeks    Status Partially Met    Target Date 08/23/21      PT LONG TERM GOAL #2   Title Pt will demonstrate decrease in NDI by at least 19%  in order to demonstrate clinically significant reduction in disability related to neck injury/pain    Baseline 10/6: will perform next session 11/21: 39% 1/16: 40% 1/30: 28%    Time 8    Period Weeks    Status Partially Met    Target Date 08/23/21      PT LONG TERM GOAL #3   Title Pt will decrease worst neck pain as reported on NPRS by at least 2 points (2/10) in order to demonstrate clinically significant reduction in neck pain.    Baseline 10/6: 4/10 11/21: 8/10 muscle spasm 1/16; 5/10 1/30: 3/10    Time 8    Period Weeks    Status Partially Met    Target Date 08/23/21      PT LONG TERM GOAL #4   Title Patient will demonstrate improved Cervical ROM by 50% in order to improve her functional ability to turn her head or nod for return of functional capabilities including driving, working, and sleeping.    Baseline 10/6: see note 11/21: see note 1/16: see note 1/30: see note    Time 8    Period Weeks    Status Partially Met    Target Date 08/23/21      PT LONG TERM GOAL #5   Title Patient will demonstrate decreased frequency of higher pain episodes to improve quality of life and demonstrate improved self maintanance.    Baseline 12/1: painful everyday since recent diagnosis 1/16: slight decrease, able to sleep in bed for past three days 1/30: 2-3 x/week of higher pain    Time 8    Period Weeks    Status Partially Met    Target Date 08/23/21      Additional Long Term Goals   Additional Long Term Goals Yes      PT LONG TERM GOAL #6   Title Patient will return to leisure activities  including [place] for return to PLOF.    Baseline 1/30: not able to quilt yet    Time 8    Period Weeks    Status New    Target Date 08/23/21                   Plan - 07/21/21 0843     Clinical Impression Statement Patient presents to physical therapy with high stress levels as can be seen in bilateral upper trap tension. Hypomobility of spine is improving however indicating area of continued progression. Postural strengthening interventions tolerated throughout session without pain increase. She will benefit from continued skilled PT services to progress her ROM, pain relief and assist in returning patient to her previous level of function in home and at work    Personal Factors and Comorbidities Age;Comorbidity 3+;Past/Current Experience;Profession;Time since onset of injury/illness/exacerbation    Comorbidities anxiety, CAD, depression, arthritis of cervical region, gastritis, GERD, cancer, stroke, HLD, HTN, IB, lumbar disc disease (fusion x2 ), migraine, numbness of L hand, headaches, and restless leg syndrome    Examination-Activity Limitations Bathing;Bed Mobility;Caring for Others;Carry;Dressing;Lift;Reach Overhead;Sleep    Examination-Participation Restrictions Cleaning;Community Activity;Driving;Laundry;Occupation;Yard Work;Meal Prep    Stability/Clinical Decision Making Evolving/Moderate complexity    Rehab Potential Good    PT Frequency 1x / week    PT Duration 8 weeks    PT Treatment/Interventions ADLs/Self Care Home Management;Cryotherapy;Electrical Stimulation;Moist Heat;Functional mobility training;Therapeutic activities;Therapeutic exercise;Patient/family education;Manual techniques;Passive range of motion;Dry needling;Taping;Spinal Manipulations;Joint Manipulations;Traction;Ultrasound;Gait training;Stair training;Energy conservation;Visual/perceptual remediation/compensation    PT Home Exercise Plan 04/02/2021= Access Code: [email or web address]    Consulted and Agree with  Plan of  Care Patient             Patient will benefit from skilled therapeutic intervention in order to improve the following deficits and impairments:  Decreased activity tolerance, Decreased endurance, Decreased mobility, Decreased range of motion, Decreased strength, Hypomobility, Impaired flexibility, Increased muscle spasms, Impaired UE functional use, Postural dysfunction, Improper body mechanics, Pain  Visit Diagnosis: Cervicalgia  Muscle weakness (generalized)  Abnormal posture     Problem List Patient Active Problem List   Diagnosis Date Noted   PFO (patent foramen ovale)    CAD (coronary artery disease), native coronary artery    Acute CVA (cerebrovascular accident) ([place]) 06/15/2018   Depression 10/16/2017   Migraine 10/16/2017   Hyperglycemia, unspecified 07/10/2017   Hyperglycemia 07/10/2017   H/O dizziness 08/02/2016   Cervical disc disorder with radiculopathy of cervical region 04/12/2016   Nonallopathic lesion of thoracic region 04/12/2016   Nonallopathic lesion of rib cage 04/12/2016   Facet arthritis of cervical region 03/21/2016   Hyperlipidemia 01/26/2015   Labral tear of shoulder, right, subsequent encounter 12/04/2014   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

## 2021-07-26 ENCOUNTER — Ambulatory Visit: Payer: 59

## 2021-07-28 ENCOUNTER — Ambulatory Visit: Payer: 59 | Attending: Internal Medicine

## 2021-07-28 ENCOUNTER — Other Ambulatory Visit: Payer: Self-pay

## 2021-07-28 DIAGNOSIS — M6281 Muscle weakness (generalized): Secondary | ICD-10-CM | POA: Diagnosis not present

## 2021-07-28 DIAGNOSIS — M542 Cervicalgia: Secondary | ICD-10-CM | POA: Diagnosis not present

## 2021-07-28 DIAGNOSIS — R293 Abnormal posture: Secondary | ICD-10-CM | POA: Insufficient documentation

## 2021-07-28 NOTE — Therapy (Signed)
[place] MAIN [facility] SERVICES [address] [place], [place], [address] Phone: [phone]   Fax:  [phone]  Physical Therapy Treatment  Patient Details  Name: Lori Morales MRN: [id number] Date of Birth: 07/09/1960 Referring Provider (PT): Tama High III MD   Encounter Date: 07/28/2021   PT End of Session - 07/28/21 0801     Visit Number 27    Number of Visits 29    Date for PT Re-Evaluation 08/23/21    Authorization Type 7/10 PN 1/16    PT Start Time 0715    PT Stop Time 0759    PT Time Calculation (min) 44 min    Activity Tolerance Patient tolerated treatment well    Behavior During Therapy [facility] for tasks assessed/performed             Past Medical History:  Diagnosis Date   Arthritis    CAD (coronary artery disease), native coronary artery    mild non calcified plaque in mid LAD and prox LCx on coronary CTA 01/2019   Depression    GERD (gastroesophageal reflux disease)    Headache    migraines   Hx of squamous cell carcinoma 07/11/2016   Right distal dorsum lat forearm    Hypertension    IBS (irritable bowel syndrome)    Palpitations    PFO (patent foramen ovale)    small by cardiac CTA 01/2019   PONV (postoperative nausea and vomiting)    Restless legs syndrome    Squamous cell carcinoma of skin 12/202/2017   right distal dorsum lateral forearm    Past Surgical History:  Procedure Laterality Date   ABDOMINOPLASTY     BACK SURGERY     1 laminectomy, 1 fusion   COLONOSCOPY     COLONOSCOPY WITH PROPOFOL N/A 01/17/2017   Procedure: COLONOSCOPY WITH PROPOFOL;  Surgeon: Lollie Sails, MD;  Location: [facility] ENDOSCOPY;  Service: Endoscopy;  Laterality: N/A;   WISDOM TOOTH EXTRACTION      There were no vitals filed for this visit.   Subjective Assessment - 07/28/21 0749     Subjective Patient reports she was able to finish her quilt. Has been having more pain in her right side into her shoulder. She is having  more pain in the afternoon than the morning.    Pertinent History Patient is returning back to physical therapy for her neck. Patient has a history of C6-7 arthroplasty and has been treated by this clinician before. PMH includes anxiety, CAD, depression, arthritis of cervical region, gastritis, GERD, cancer, stroke, HLD, HTN, IB, lumbar disc disease (fusion x2 ), migraine, numbness of L hand, headaches, and restless leg syndrome. R sided neck pain with L sided tightness, but no radiating to the arm.    Limitations Lifting;Writing;House hold activities;Other (comment);Reading;Sitting    How long can you sit comfortably? Increased pain if performing deskwork for > 1/2 day.    How long can you stand comfortably? n/a    How long can you walk comfortably? n/a    Diagnostic tests MRI prior to surgery    Patient Stated Goals To be painfree and work without pain or restrictions as well as sleep better.    Currently in Pain? Yes    Pain Score 2     Pain Location Neck    Pain Orientation Right;Left    Pain Descriptors / Indicators Aching    Pain Type Chronic pain    Pain Onset More than a month  ago   ° Pain Frequency Intermittent   ° °  °  ° °  ° ° ° ° ° °Treatment:  °  °Mobilization to C3-4 with movement: patient extending cervical spine over PT fingers 12x 5 second holds °Grade II CPA and UPA thoracic and cervical spine 4x 10 seconds each level °J mobilization 2x30 second holds  °Cervical sidebend with gentle overpressure 3x60 second holds each direction  °Cervical rotation with gentle overpressure 3x 60 second holds each direction  °Downslip of C2 and C3 R UPA for opening in supine and prone position x 8 minutes total grade II  °L shoulder AP, inferior glide x 3 minutes °L shoulder median nerve glide x2 minutes °RTB overhead raise 15x °RTB ER with focus on scapular retraction 15x  °  °  °Pt educated throughout session about proper posture and technique with exercises. Improved exercise technique, movement at  target joints, use of target muscles after min to mod verbal, visual, tactile cues.  °  ° Trigger Point Dry Needling (TDN), unbilled °Education performed with patient regarding potential benefit of TDN. Reviewed precautions and risks with patient. Reviewed special precautions/risks over lung fields which include pneumothorax. Reviewed signs and symptoms of pneumothorax and advised pt to go to ER immediately if these symptoms develop advise them of dry needling treatment. Extensive time spent with pt to ensure full understanding of TDN risks. Pt provided verbal consent to treatment. TDN performed to  with 0.25 x .40 single needle placements with local twitch response (LTR) to bilateral upper traps, R suboccipitals, cervical paraspinals, L subscap. Pistoning technique utilized. Improved pain-free motion following intervention. x12 minutes °  ° ° ° °Patient tolerates progressive mobilization and range of motion of cervical musculature. Her postural strengthening continues to be limited indicating area for focus. Shoulder extension in overhead plane is challenging for patient and fatigues quickly. She is highly motivated and eager for progression. Will have another session prior to her week trip to [place], upon which we will perform education on pain reduction during travel. She will benefit from continued skilled PT services to progress her ROM, pain relief and assist in returning patient to her previous level of function in home and at work ° ° ° ° ° ° ° ° ° ° ° ° ° ° ° ° ° ° ° PT Education - 07/28/21 0800   ° ° Education Details exercise technique, body mechanics   ° Person(s) Educated Patient   ° Methods Explanation;Demonstration;Tactile cues;Verbal cues   ° Comprehension Verbalized understanding;Returned demonstration;Verbal cues required;Tactile cues required   ° °  °  ° °  ° ° ° PT Short Term Goals - 04/19/21 0726   ° °  ° PT SHORT TERM GOAL #1  ° Title Pt will be independent with HEP in order to improve  strength and decrease neck pain in order to improve pain-free function at home and work.   ° Baseline 10/6: will provide next session 11/21; HEP compliant   ° Time 4   ° Period Weeks   ° Status Achieved   ° Target Date 04/01/21   ° °  °  ° °  ° ° ° ° PT Long Term Goals - 06/28/21 0805   ° °  ° PT LONG TERM GOAL #1  ° Title Patient will increase FOTO score to equal to or greater than  61/100   to demonstrate statistically significant improvement in mobility and quality of life.   ° Baseline 10/6: 48% 11/21: 46%   1/16: 50% 1/30: 58%    Time 8    Period Weeks    Status Partially Met    Target Date 08/23/21      PT LONG TERM GOAL #2   Title Pt will demonstrate decrease in NDI by at least 19% in order to demonstrate clinically significant reduction in disability related to neck injury/pain    Baseline 10/6: will perform next session 11/21: 39% 1/16: 40% 1/30: 28%    Time 8    Period Weeks    Status Partially Met    Target Date 08/23/21      PT LONG TERM GOAL #3   Title Pt will decrease worst neck pain as reported on NPRS by at least 2 points (2/10) in order to demonstrate clinically significant reduction in neck pain.    Baseline 10/6: 4/10 11/21: 8/10 muscle spasm 1/16; 5/10 1/30: 3/10    Time 8    Period Weeks    Status Partially Met    Target Date 08/23/21      PT LONG TERM GOAL #4   Title Patient will demonstrate improved Cervical ROM by 50% in order to improve her functional ability to turn her head or nod for return of functional capabilities including driving, working, and sleeping.    Baseline 10/6: see note 11/21: see note 1/16: see note 1/30: see note    Time 8    Period Weeks    Status Partially Met    Target Date 08/23/21      PT LONG TERM GOAL #5   Title Patient will demonstrate decreased frequency of higher pain episodes to improve quality of life and demonstrate improved self maintanance.    Baseline 12/1: painful everyday since recent diagnosis 1/16: slight decrease, able to  sleep in bed for past three days 1/30: 2-3 x/week of higher pain    Time 8    Period Weeks    Status Partially Met    Target Date 08/23/21      Additional Long Term Goals   Additional Long Term Goals Yes      PT LONG TERM GOAL #6   Title Patient will return to leisure activities including [place] for return to PLOF.    Baseline 1/30: not able to quilt yet    Time 8    Period Weeks    Status New    Target Date 08/23/21                   Plan - 07/28/21 0803     Clinical Impression Statement Patient tolerates progressive mobilization and range of motion of cervical musculature. Her postural strengthening continues to be limited indicating area for focus. Shoulder extension in overhead plane is challenging for patient and fatigues quickly. She is highly motivated and eager for progression. Will have another session prior to her week trip to [place], upon which we will perform education on pain reduction during travel. She will benefit from continued skilled PT services to progress her ROM, pain relief and assist in returning patient to her previous level of function in home and at work    Personal Factors and Comorbidities Age;Comorbidity 3+;Past/Current Experience;Profession;Time since onset of injury/illness/exacerbation    Comorbidities anxiety, CAD, depression, arthritis of cervical region, gastritis, GERD, cancer, stroke, HLD, HTN, IB, lumbar disc disease (fusion x2 ), migraine, numbness of L hand, headaches, and restless leg syndrome    Examination-Activity Limitations Bathing;Bed Mobility;Caring for Others;Carry;Dressing;Lift;Reach Overhead;Sleep    Examination-Participation Restrictions Cleaning;Community Activity;Driving;Laundry;Occupation;Yard Work;Meal  Prep   ° Stability/Clinical Decision Making Evolving/Moderate complexity   ° Rehab Potential Good   ° PT Frequency 1x / week   ° PT Duration 8 weeks   ° PT Treatment/Interventions ADLs/Self Care Home  Management;Cryotherapy;Electrical Stimulation;Moist Heat;Functional mobility training;Therapeutic activities;Therapeutic exercise;Patient/family education;Manual techniques;Passive range of motion;Dry needling;Taping;Spinal Manipulations;Joint Manipulations;Traction;Ultrasound;Gait training;Stair training;Energy conservation;Visual/perceptual remediation/compensation   ° PT Home Exercise Plan 04/02/2021= Access Code: [email or web address]   ° Consulted and Agree with Plan of Care Patient   ° °  °  ° °  ° ° °Patient will benefit from skilled therapeutic intervention in order to improve the following deficits and impairments:  Decreased activity tolerance, Decreased endurance, Decreased mobility, Decreased range of motion, Decreased strength, Hypomobility, Impaired flexibility, Increased muscle spasms, Impaired UE functional use, Postural dysfunction, Improper body mechanics, Pain ° °Visit Diagnosis: °Cervicalgia ° °Muscle weakness (generalized) ° °Abnormal posture ° ° ° ° °Problem List °Patient Active Problem List  ° Diagnosis Date Noted  ° PFO (patent foramen ovale)   ° CAD (coronary artery disease), native coronary artery   ° Acute CVA (cerebrovascular accident) ([facility]) 06/15/2018  ° Depression 10/16/2017  ° Migraine 10/16/2017  ° Hyperglycemia, unspecified 07/10/2017  ° Hyperglycemia 07/10/2017  ° H/O dizziness 08/02/2016  ° Cervical disc disorder with radiculopathy of cervical region 04/12/2016  ° Nonallopathic lesion of thoracic region 04/12/2016  ° Nonallopathic lesion of rib cage 04/12/2016  ° Facet arthritis of cervical region 03/21/2016  ° Hyperlipidemia 01/26/2015  ° Labral tear of shoulder, right, subsequent encounter 12/04/2014  ° 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

## 2021-08-02 ENCOUNTER — Other Ambulatory Visit: Payer: Self-pay

## 2021-08-03 ENCOUNTER — Other Ambulatory Visit: Payer: Self-pay

## 2021-08-03 ENCOUNTER — Ambulatory Visit: Payer: 59

## 2021-08-03 DIAGNOSIS — R293 Abnormal posture: Secondary | ICD-10-CM

## 2021-08-03 DIAGNOSIS — M542 Cervicalgia: Secondary | ICD-10-CM | POA: Diagnosis not present

## 2021-08-03 DIAGNOSIS — M6281 Muscle weakness (generalized): Secondary | ICD-10-CM

## 2021-08-03 NOTE — Therapy (Signed)
[place] MAIN [facility] SERVICES [address] [place], [place], [address] Phone: [phone]   Fax:  [phone]  Physical Therapy Treatment  Patient Details  Name: Lori Morales MRN: [id number] Date of Birth: 09-Sep-1960 Referring Provider (PT): Tama High III MD   Encounter Date: 08/03/2021   PT End of Session - 08/03/21 0744     Visit Number 28    Number of Visits 29    Date for PT Re-Evaluation 08/23/21    Authorization Type 8/10 PN 1/16    PT Start Time 0715    PT Stop Time 0759    PT Time Calculation (min) 44 min    Activity Tolerance Patient tolerated treatment well    Behavior During Therapy [facility] for tasks assessed/performed             Past Medical History:  Diagnosis Date   Arthritis    CAD (coronary artery disease), native coronary artery    mild non calcified plaque in mid LAD and prox LCx on coronary CTA 01/2019   Depression    GERD (gastroesophageal reflux disease)    Headache    migraines   Hx of squamous cell carcinoma 07/11/2016   Right distal dorsum lat forearm    Hypertension    IBS (irritable bowel syndrome)    Palpitations    PFO (patent foramen ovale)    small by cardiac CTA 01/2019   PONV (postoperative nausea and vomiting)    Restless legs syndrome    Squamous cell carcinoma of skin 12/202/2017   right distal dorsum lateral forearm    Past Surgical History:  Procedure Laterality Date   ABDOMINOPLASTY     BACK SURGERY     1 laminectomy, 1 fusion   COLONOSCOPY     COLONOSCOPY WITH PROPOFOL N/A 01/17/2017   Procedure: COLONOSCOPY WITH PROPOFOL;  Surgeon: Lollie Sails, MD;  Location: [facility] ENDOSCOPY;  Service: Endoscopy;  Laterality: N/A;   WISDOM TOOTH EXTRACTION      There were no vitals filed for this visit.   Subjective Assessment - 08/03/21 0743     Subjective Patient is moving into her new home tonight. Has been unpacking her boxes. Leaves next week for [place] to visit faimly.     Pertinent History Patient is returning back to physical therapy for her neck. Patient has a history of C6-7 arthroplasty and has been treated by this clinician before. PMH includes anxiety, CAD, depression, arthritis of cervical region, gastritis, GERD, cancer, stroke, HLD, HTN, IB, lumbar disc disease (fusion x2 ), migraine, numbness of L hand, headaches, and restless leg syndrome. R sided neck pain with L sided tightness, but no radiating to the arm.    Limitations Lifting;Writing;House hold activities;Other (comment);Reading;Sitting    How long can you sit comfortably? Increased pain if performing deskwork for > 1/2 day.    How long can you stand comfortably? n/a    How long can you walk comfortably? n/a    Diagnostic tests MRI prior to surgery    Patient Stated Goals To be painfree and work without pain or restrictions as well as sleep better.    Currently in Pain? Yes    Pain Score 4     Pain Location Neck    Pain Orientation Right;Left    Pain Descriptors / Indicators Aching    Pain Type Chronic pain    Pain Onset More than a month ago    Pain Frequency Intermittent  Treatment:    Mobilization to C3-4 with movement: patient extending cervical spine over PT fingers 12x 5 second holds Grade II CPA and UPA thoracic and cervical spine 4x 10 seconds each level J mobilization 2x30 second holds  Cervical sidebend with gentle overpressure 3x60 second holds each direction  Cervical rotation with gentle overpressure 3x 60 second holds each direction  Downslip of C2 and C3 R UPA for opening in supine and prone position x 8 minutes total grade II   On half foam roller: -robber stretch 30 seconds; x 2 trials -RTB overhead raise 15x -RTB ER with focus on scapular retraction 15x     seated trunk extension for carryover to flight 10x over towel  Pt educated throughout session about proper posture and technique with exercises. Improved exercise technique,  movement at target joints, use of target muscles after min to mod verbal, visual, tactile cues.     Trigger Point Dry Needling (TDN), unbilled Education performed with patient regarding potential benefit of TDN. Reviewed precautions and risks with patient. Reviewed special precautions/risks over lung fields which include pneumothorax. Reviewed signs and symptoms of pneumothorax and advised pt to go to ER immediately if these symptoms develop advise them of dry needling treatment. Extensive time spent with pt to ensure full understanding of TDN risks. Pt provided verbal consent to treatment. TDN performed to  with 0.25 x .40 single needle placements with local twitch response (LTR) to bilateral upper traps,  cervical paraspinals, L subscap and infraspinatus. Pistoning technique utilized. Improved pain-free motion following intervention. x12 minutes    Patient tolerates progressive postural alignment and strengthening interventions on half foam roller. Education on posture during flight with additional trunk extension HEP performed with patient demonstrating understanding. Decreased hypomobility and pain with joint mobilizations this session. Significant trigger points in subscap reduce radiating symptoms to posterior aspect of arm when released. She will benefit from continued skilled PT services to progress her ROM, pain relief and assist in returning patient to her previous level of function in home and at work                   PT Education - 08/03/21 0744     Education Details exercise technique, body mechanics, posture    Person(s) Educated Patient    Methods Explanation;Demonstration;Tactile cues;Verbal cues    Comprehension Verbalized understanding;Returned demonstration;Verbal cues required;Tactile cues required              PT Short Term Goals - 04/19/21 0726       PT SHORT TERM GOAL #1   Title Pt will be independent with HEP in order to improve strength and decrease  neck pain in order to improve pain-free function at home and work.    Baseline 10/6: will provide next session 11/21; HEP compliant    Time 4    Period Weeks    Status Achieved    Target Date 04/01/21               PT Long Term Goals - 06/28/21 0805       PT LONG TERM GOAL #1   Title Patient will increase FOTO score to equal to or greater than  61/100   to demonstrate statistically significant improvement in mobility and quality of life.    Baseline 10/6: 48% 11/21: 46% 1/16: 50% 1/30: 58%    Time 8    Period Weeks    Status Partially Met    Target Date 08/23/21  PT LONG TERM GOAL #2   Title Pt will demonstrate decrease in NDI by at least 19% in order to demonstrate clinically significant reduction in disability related to neck injury/pain    Baseline 10/6: will perform next session 11/21: 39% 1/16: 40% 1/30: 28%    Time 8    Period Weeks    Status Partially Met    Target Date 08/23/21      PT LONG TERM GOAL #3   Title Pt will decrease worst neck pain as reported on NPRS by at least 2 points (2/10) in order to demonstrate clinically significant reduction in neck pain.    Baseline 10/6: 4/10 11/21: 8/10 muscle spasm 1/16; 5/10 1/30: 3/10    Time 8    Period Weeks    Status Partially Met    Target Date 08/23/21      PT LONG TERM GOAL #4   Title Patient will demonstrate improved Cervical ROM by 50% in order to improve her functional ability to turn her head or nod for return of functional capabilities including driving, working, and sleeping.    Baseline 10/6: see note 11/21: see note 1/16: see note 1/30: see note    Time 8    Period Weeks    Status Partially Met    Target Date 08/23/21      PT LONG TERM GOAL #5   Title Patient will demonstrate decreased frequency of higher pain episodes to improve quality of life and demonstrate improved self maintanance.    Baseline 12/1: painful everyday since recent diagnosis 1/16: slight decrease, able to sleep in bed for past  three days 1/30: 2-3 x/week of higher pain    Time 8    Period Weeks    Status Partially Met    Target Date 08/23/21      Additional Long Term Goals   Additional Long Term Goals Yes      PT LONG TERM GOAL #6   Title Patient will return to leisure activities including [place] for return to PLOF.    Baseline 1/30: not able to quilt yet    Time 8    Period Weeks    Status New    Target Date 08/23/21                   Plan - 08/03/21 0857     Clinical Impression Statement Patient tolerates progressive postural alignment and strengthening interventions on half foam roller. Education on posture during flight with additional trunk extension HEP performed with patient demonstrating understanding. Decreased hypomobility and pain with joint mobilizations this session. Significant trigger points in subscap reduce radiating symptoms to posterior aspect of arm when released. She will benefit from continued skilled PT services to progress her ROM, pain relief and assist in returning patient to her previous level of function in home and at work    Personal Factors and Comorbidities Age;Comorbidity 3+;Past/Current Experience;Profession;Time since onset of injury/illness/exacerbation    Comorbidities anxiety, CAD, depression, arthritis of cervical region, gastritis, GERD, cancer, stroke, HLD, HTN, IB, lumbar disc disease (fusion x2 ), migraine, numbness of L hand, headaches, and restless leg syndrome    Examination-Activity Limitations Bathing;Bed Mobility;Caring for Others;Carry;Dressing;Lift;Reach Overhead;Sleep    Examination-Participation Restrictions Cleaning;Community Activity;Driving;Laundry;Occupation;Yard Work;Meal Prep    Stability/Clinical Decision Making Evolving/Moderate complexity    Rehab Potential Good    PT Frequency 1x / week    PT Duration 8 weeks    PT Treatment/Interventions ADLs/Self Care Home Management;Cryotherapy;Electrical Stimulation;Moist Heat;Functional mobility  training;Therapeutic  activities;Therapeutic exercise;Patient/family education;Manual techniques;Passive range of motion;Dry needling;Taping;Spinal Manipulations;Joint Manipulations;Traction;Ultrasound;Gait training;Stair training;Energy conservation;Visual/perceptual remediation/compensation    PT Home Exercise Plan 04/02/2021= Access Code: [email or web address]    Consulted and Agree with Plan of Care Patient             Patient will benefit from skilled therapeutic intervention in order to improve the following deficits and impairments:  Decreased activity tolerance, Decreased endurance, Decreased mobility, Decreased range of motion, Decreased strength, Hypomobility, Impaired flexibility, Increased muscle spasms, Impaired UE functional use, Postural dysfunction, Improper body mechanics, Pain  Visit Diagnosis: Cervicalgia  Muscle weakness (generalized)  Abnormal posture     Problem List Patient Active Problem List   Diagnosis Date Noted   PFO (patent foramen ovale)    CAD (coronary artery disease), native coronary artery    Acute CVA (cerebrovascular accident) ([place]) 06/15/2018   Depression 10/16/2017   Migraine 10/16/2017   Hyperglycemia, unspecified 07/10/2017   Hyperglycemia 07/10/2017   H/O dizziness 08/02/2016   Cervical disc disorder with radiculopathy of cervical region 04/12/2016   Nonallopathic lesion of thoracic region 04/12/2016   Nonallopathic lesion of rib cage 04/12/2016   Facet arthritis of cervical region 03/21/2016   Hyperlipidemia 01/26/2015   Labral tear of shoulder, right, subsequent encounter 12/04/2014   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 [place], [place], [address] Phone: [phone]   Fax:  [phone]  Name: AZALYN SLIWA MRN: [id number] Date of Birth: 29-Jul-1960

## 2021-08-06 ENCOUNTER — Other Ambulatory Visit: Payer: Self-pay

## 2021-08-06 MED ORDER — PREDNISONE 10 MG PO TABS
ORAL_TABLET | ORAL | 0 refills | Status: DC
  Filled 2021-08-06: qty 20, 8d supply, fill #0

## 2021-08-12 ENCOUNTER — Ambulatory Visit: Payer: 59

## 2021-08-18 ENCOUNTER — Other Ambulatory Visit: Payer: Self-pay

## 2021-08-18 ENCOUNTER — Ambulatory Visit: Payer: 59

## 2021-08-18 DIAGNOSIS — M6281 Muscle weakness (generalized): Secondary | ICD-10-CM | POA: Diagnosis not present

## 2021-08-18 DIAGNOSIS — M519 Unspecified thoracic, thoracolumbar and lumbosacral intervertebral disc disorder: Secondary | ICD-10-CM | POA: Diagnosis not present

## 2021-08-18 DIAGNOSIS — M542 Cervicalgia: Secondary | ICD-10-CM | POA: Diagnosis not present

## 2021-08-18 DIAGNOSIS — R739 Hyperglycemia, unspecified: Secondary | ICD-10-CM | POA: Diagnosis not present

## 2021-08-18 DIAGNOSIS — I10 Essential (primary) hypertension: Secondary | ICD-10-CM | POA: Diagnosis not present

## 2021-08-18 DIAGNOSIS — I2510 Atherosclerotic heart disease of native coronary artery without angina pectoris: Secondary | ICD-10-CM | POA: Diagnosis not present

## 2021-08-18 DIAGNOSIS — E7849 Other hyperlipidemia: Secondary | ICD-10-CM | POA: Diagnosis not present

## 2021-08-18 DIAGNOSIS — R293 Abnormal posture: Secondary | ICD-10-CM

## 2021-08-18 NOTE — Therapy (Signed)
[place] [facility] ?[place], [place], [address] ?Phone: [phone]   Fax:  [phone] ? ?Physical Therapy Treatment/RECERT ? ?Patient Details  ?Name: Lori Morales ?MRN: [id number] ?Date of Birth: [birth year] ?Referring Provider (PT): Tama High III MD ? ? ?Encounter Date: 08/18/2021 ? ? PT End of Session - 08/18/21 0750   ? ? Visit Number 29   ? Number of Visits 37   ? Date for PT Re-Evaluation 10/13/21   ? Authorization Type 9/10 PN 1/16   ? PT Start Time [phone]   ? PT Stop Time 0758   ? PT Time Calculation (min) 42 min   ? Activity Tolerance Patient tolerated treatment well   ? Behavior During Therapy [facility] for tasks assessed/performed   ? ?  ?  ? ?  ? ? ?Past Medical History:  ?Diagnosis Date  ? Arthritis   ? CAD (coronary artery disease), native coronary artery   ? mild non calcified plaque in mid LAD and prox LCx on coronary CTA 01/2019  ? Depression   ? GERD (gastroesophageal reflux disease)   ? Headache   ? migraines  ? Hx of squamous cell carcinoma 07/11/2016  ? Right distal dorsum lat forearm   ? Hypertension   ? IBS (irritable bowel syndrome)   ? Palpitations   ? PFO (patent foramen ovale)   ? small by cardiac CTA 01/2019  ? PONV (postoperative nausea and vomiting)   ? Restless legs syndrome   ? Squamous cell carcinoma of skin 12/202/2017  ? right distal dorsum lateral forearm  ? ? ?Past Surgical History:  ?Procedure Laterality Date  ? ABDOMINOPLASTY    ? BACK SURGERY    ? 1 laminectomy, 1 fusion  ? COLONOSCOPY    ? COLONOSCOPY WITH PROPOFOL N/A 01/17/2017  ? Procedure: COLONOSCOPY WITH PROPOFOL;  Surgeon: Lollie Sails, MD;  Location: [facility] ENDOSCOPY;  Service: Endoscopy;  Laterality: N/A;  ? WISDOM TOOTH EXTRACTION    ? ? ?There were no vitals filed for this visit. ? ? Subjective Assessment - 08/18/21 0724   ? ? Subjective Patient is returning from trip to [place] and moving into new home.   ? Pertinent History Patient is returning back  to physical therapy for her neck. Patient has a history of C6-7 arthroplasty and has been treated by this clinician before. PMH includes anxiety, CAD, depression, arthritis of cervical region, gastritis, GERD, cancer, stroke, HLD, HTN, IB, lumbar disc disease (fusion x2 ), migraine, numbness of L hand, headaches, and restless leg syndrome. R sided neck pain with L sided tightness, but no radiating to the arm.   ? Limitations Lifting;Writing;House hold activities;Other (comment);Reading;Sitting   ? How long can you sit comfortably? Increased pain if performing deskwork for > 1/2 day.   ? How long can you stand comfortably? n/a   ? How long can you walk comfortably? n/a   ? Diagnostic tests MRI prior to surgery   ? Patient Stated Goals To be painfree and work without pain or restrictions as well as sleep better.   ? Currently in Pain? Yes   ? Pain Score 3    ? Pain Location Neck   ? Pain Orientation Right;Left   ? Pain Descriptors / Indicators Aching   ? Pain Type Chronic pain   ? Pain Onset More than a month ago   ? ?  ?  ? ?  ? ? ? ?Patient returning from trip.   ? ?  Goals:  ?FOTO 58 ?NDI: 22%  ?VAS: 4/10  ?Cervical ROM ? ? ? Right Left  ?Flexion 46  ?Extension 45  ?Side Bending 25 25  ?Rotation 52 50  ? ? ?Frequency of higher pain: will have 3 days of pain about a 4/10 towards end of day ?Return to leisure activities/quilting: has to take breaks  ? ?  ?Treatment:  ?  ?Mobilization to C3-4 with movement: patient extending cervical spine over PT fingers 12x 5 second holds ?Grade II CPA and UPA thoracic and cervical spine 4x 10 seconds each level ?J mobilization 2x30 second holds  ?Cervical sidebend with gentle overpressure 3x60 second holds each direction  ?Cervical rotation with gentle overpressure 3x 60 second holds each direction  ?Downslip of C2 and C3 R UPA for opening in supine and prone position x 8 minutes total grade II  ?  ?Scapular retraction with cervical rotation 10x 5 second holds ?Towel distraction with  cervical extension 10x  ?seated trunk extension for carryover to flight 10x over towel ?  ?Pt educated throughout session about proper posture and technique with exercises. Improved exercise technique, movement at target joints, use of target muscles after min to mod verbal, visual, tactile cues.  ?  ? Trigger Point Dry Needling (TDN), unbilled ?Education performed with patient regarding potential benefit of TDN. Reviewed precautions and risks with patient. Reviewed special precautions/risks over lung fields which include pneumothorax. Reviewed signs and symptoms of pneumothorax and advised pt to go to ER immediately if these symptoms develop advise them of dry needling treatment. Extensive time spent with pt to ensure full understanding of TDN risks. Pt provided verbal consent to treatment. TDN performed to  with 0.25 x .40 single needle placements with local twitch response (LTR) to bilateral upper traps,  cervical paraspinals, L subscap and infraspinatus. Pistoning technique utilized. Improved pain-free motion following intervention. x10 minutes ? ? ? ? ? ?Will recert for another round of therapy to ensure coverage of patient while she finishes moving/unpacking from move. Patient is having less frequent days of pain and only had a high episode of pain with her flight which is to be expected. She is able to return partially to quilting/crotchet but does still require breaks at this time. Patient is able to self manage pain majority of time but not 100% of the time yet. Patient reports decreased pain and stiffness by end of session. She will benefit from continued skilled PT services to progress her ROM, pain relief and assist in returning patient to her previous level of function in home and at work ? ? ? ? ? ? ? ? ? ? ? ? ? ? ? ? ? ? PT Education - 08/18/21 0758   ? ? Education Details goals, POC   ? Person(s) Educated Patient   ? Methods Explanation;Demonstration;Tactile cues;Verbal cues   ? Comprehension  Verbalized understanding;Returned demonstration;Verbal cues required;Tactile cues required;Need further instruction   ? ?  ?  ? ?  ? ? ? PT Short Term Goals - 04/19/21 0726   ? ?  ? PT SHORT TERM GOAL #1  ? Title Pt will be independent with HEP in order to improve strength and decrease neck pain in order to improve pain-free function at home and work.   ? Baseline 10/6: will provide next session 11/21; HEP compliant   ? Time 4   ? Period Weeks   ? Status Achieved   ? Target Date 04/01/21   ? ?  ?  ? ?  ? ? ? ?  PT Long Term Goals - 08/18/21 0748   ? ?  ? PT LONG TERM GOAL #1  ? Title Patient will increase FOTO score to equal to or greater than  61/100   to demonstrate statistically significant improvement in mobility and quality of life.   ? Baseline 10/6: 48% 11/21: 46% 1/16: 50% 1/30: 58% 3/22: 58%   ? Time 8   ? Period Weeks   ? Status Partially Met   ? Target Date 10/13/21   ?  ? PT LONG TERM GOAL #2  ? Title Pt will demonstrate decrease in NDI by at least 19% in order to demonstrate clinically significant reduction in disability related to neck injury/pain   ? Baseline 10/6: will perform next session 11/21: 39% 1/16: 40% 1/30: 28% 3/22: 22%   ? Time 8   ? Period Weeks   ? Status Partially Met   ? Target Date 10/13/21   ?  ? PT LONG TERM GOAL #3  ? Title Pt will decrease worst neck pain as reported on NPRS by at least 2 points (2/10) in order to demonstrate clinically significant reduction in neck pain.   ? Baseline 10/6: 4/10 11/21: 8/10 muscle spasm 1/16; 5/10 1/30: 3/10 3/22: 4/10 flying   ? Time 8   ? Period Weeks   ? Status Partially Met   ? Target Date 10/13/21   ?  ? PT LONG TERM GOAL #4  ? Title Patient will demonstrate improved Cervical ROM by 50% in order to improve her functional ability to turn her head or nod for return of functional capabilities including driving, working, and sleeping.   ? Baseline 10/6: see note 11/21: see note 1/16: see note 1/30: see note 3/22: see note   ? Time 8   ? Period  Weeks   ? Status Partially Met   ? Target Date 10/13/21   ?  ? PT LONG TERM GOAL #5  ? Title Patient will demonstrate decreased frequency of higher pain episodes to improve quality of life and demonstrate imp

## 2021-08-19 ENCOUNTER — Other Ambulatory Visit: Payer: Self-pay

## 2021-08-19 MED ORDER — PANTOPRAZOLE SODIUM 40 MG PO TBEC
DELAYED_RELEASE_TABLET | ORAL | 3 refills | Status: DC
  Filled 2021-08-19: qty 90, 90d supply, fill #0
  Filled 2021-11-13: qty 90, 90d supply, fill #1

## 2021-08-19 MED ORDER — CYCLOBENZAPRINE HCL 10 MG PO TABS
ORAL_TABLET | ORAL | 5 refills | Status: DC
  Filled 2021-08-19: qty 20, 20d supply, fill #0
  Filled 2022-01-05: qty 20, 20d supply, fill #1

## 2021-08-19 MED ORDER — TIZANIDINE HCL 2 MG PO TABS
ORAL_TABLET | ORAL | 11 refills | Status: DC
  Filled 2021-08-19: qty 60, 20d supply, fill #0
  Filled 2021-10-17: qty 60, 20d supply, fill #1
  Filled 2021-12-20: qty 60, 20d supply, fill #2
  Filled 2022-01-05 – 2022-04-06 (×2): qty 60, 20d supply, fill #3

## 2021-08-19 MED ORDER — CELECOXIB 200 MG PO CAPS
ORAL_CAPSULE | ORAL | 3 refills | Status: DC
  Filled 2021-08-19: qty 180, 90d supply, fill #0
  Filled 2022-01-05: qty 180, 90d supply, fill #1

## 2021-08-19 MED ORDER — PRAVASTATIN SODIUM 40 MG PO TABS
ORAL_TABLET | ORAL | 3 refills | Status: DC
  Filled 2021-08-19: qty 90, 90d supply, fill #0
  Filled 2021-11-13: qty 90, 90d supply, fill #1
  Filled 2022-02-20: qty 90, 90d supply, fill #2

## 2021-08-25 ENCOUNTER — Ambulatory Visit: Payer: 59

## 2021-08-25 ENCOUNTER — Other Ambulatory Visit: Payer: Self-pay

## 2021-08-25 DIAGNOSIS — M542 Cervicalgia: Secondary | ICD-10-CM | POA: Diagnosis not present

## 2021-08-25 DIAGNOSIS — M6281 Muscle weakness (generalized): Secondary | ICD-10-CM

## 2021-08-25 DIAGNOSIS — R293 Abnormal posture: Secondary | ICD-10-CM | POA: Diagnosis not present

## 2021-08-25 NOTE — Therapy (Signed)
[place] ?[facility] ?[address] ?[place], [place], [address] ?Phone: [phone]   Fax:  [phone] ? ?Physical Therapy Treatment/Physical Therapy Progress Note ? ? ?Dates of reporting period  06/14/21   to   08/25/21 ? ? ?Patient Details  ?Name: Lori Morales ?MRN: [id number] ?Date of Birth: 04/24/1961 ?Referring Provider (PT): Klein, Bert J III MD ? ? ?Encounter Date: 08/25/2021 ? ? PT End of Session - 08/25/21 0745   ? ? Visit Number 30   ? Number of Visits 37   ? Date for PT Re-Evaluation 10/13/21   ? Authorization Type 10/10 PN 1/16; next session 1/10 3/29   ? PT Start Time 0716   ? PT Stop Time 0800   ? PT Time Calculation (min) 44 min   ? Activity Tolerance Patient tolerated treatment well   ? Behavior During Therapy WFL for tasks assessed/performed   ? ?  ?  ? ?  ? ? ?Past Medical History:  ?Diagnosis Date  ? Arthritis   ? CAD (coronary artery disease), native coronary artery   ? mild non calcified plaque in mid LAD and prox LCx on coronary CTA 01/2019  ? Depression   ? GERD (gastroesophageal reflux disease)   ? Headache   ? migraines  ? Hx of squamous cell carcinoma 07/11/2016  ? Right distal dorsum lat forearm   ? Hypertension   ? IBS (irritable bowel syndrome)   ? Palpitations   ? PFO (patent foramen ovale)   ? small by cardiac CTA 01/2019  ? PONV (postoperative nausea and vomiting)   ? Restless legs syndrome   ? Squamous cell carcinoma of skin 12/202/2017  ? right distal dorsum lateral forearm  ? ? ?Past Surgical History:  ?Procedure Laterality Date  ? ABDOMINOPLASTY    ? BACK SURGERY    ? 1 laminectomy, 1 fusion  ? COLONOSCOPY    ? COLONOSCOPY WITH PROPOFOL N/A 01/17/2017  ? Procedure: COLONOSCOPY WITH PROPOFOL;  Surgeon: Skulskie, Martin U, MD;  Location: [facility] ENDOSCOPY;  Service: Endoscopy;  Laterality: N/A;  ? WISDOM TOOTH EXTRACTION    ? ? ?There were no vitals filed for this visit. ? ? Subjective Assessment - 08/25/21 0750   ? ? Subjective Patient  reports compliance with HEP. Was unpacking boxes over the weekend and had some increase in discomfort.   ? Pertinent History Patient is returning back to physical therapy for her neck. Patient has a history of C6-7 arthroplasty and has been treated by this clinician before. PMH includes anxiety, CAD, depression, arthritis of cervical region, gastritis, GERD, cancer, stroke, HLD, HTN, IB, lumbar disc disease (fusion x2 ), migraine, numbness of L hand, headaches, and restless leg syndrome. R sided neck pain with L sided tightness, but no radiating to the arm.   ? Limitations Lifting;Writing;House hold activities;Other (comment);Reading;Sitting   ? How long can you sit comfortably? Increased pain if performing deskwork for > 1/2 day.   ? How long can you stand comfortably? n/a   ? How long can you walk comfortably? n/a   ? Diagnostic tests MRI prior to surgery   ? Patient Stated Goals To be painfree and work without pain or restrictions as well as sleep better.   ? Currently in Pain? Yes   ? Pain Score 3    ? Pain Location Neck   ? Pain Orientation Right;Left   ? Pain Descriptors / Indicators Aching   ? Pain Type Chronic pain   ?   Pain Onset More than a month ago   ? Pain Frequency Intermittent   ? ?  ?  ? ?  ? ? ? ? ? ? ? ?  ?  ?Treatment:  ?  ? ?Grade II CPA and UPA thoracic and cervical spine 4x 10 seconds each level ?J mobilization 2x30 second holds  ?Cervical sidebend with gentle overpressure 3x60 second holds each direction  ?Cervical rotation with gentle overpressure 3x 60 second holds each direction  ? ? ?Green swiss ball: I's, T's, Y's, 10x each direction; cueing for sequencing ?Quadruped to child pose 5x 30 second holds  ?  ?On half foam roller: ?-robber stretch 30 seconds; x 2 trials ?-RTB overhead raise 15x ?-RTB ER with focus on scapular retraction 15x  ?  ? ?Pt educated throughout session about proper posture and technique with exercises. Improved exercise technique, movement at target joints, use of  target muscles after min to mod verbal, visual, tactile cues.  ?  ? Trigger Point Dry Needling (TDN), unbilled ?Education performed with patient regarding potential benefit of TDN. Reviewed precautions and risks with patient. Reviewed special precautions/risks over lung fields which include pneumothorax. Reviewed signs and symptoms of pneumothorax and advised pt to go to ER immediately if these symptoms develop advise them of dry needling treatment. Extensive time spent with pt to ensure full understanding of TDN risks. Pt provided verbal consent to treatment. TDN performed to  with 0.25 x .40 single needle placements with local twitch response (LTR) to bilateral upper traps,  cervical paraspinals, L subscap and infraspinatus. Pistoning technique utilized. Improved pain-free motion following intervention. x10 minutes ? ?Patient's condition has the potential to improve in response to therapy. Maximum improvement is yet to be obtained. The anticipated improvement is attainable and reasonable in a generally predictable time.  Patient reports she is able to control her pain most of the time.  ? ? ?Please refer to last session 08/18/21 for further information on goals. Patient is able to tolerate progressive strengthening of postural musculature without pain increase. Additional postural lengthening of pectoral muscles performed. Decreased tension noted with TDN and manual. Patient's condition has the potential to improve in response to therapy. Maximum improvement is yet to be obtained. The anticipated improvement is attainable and reasonable in a generally predictable time. She will benefit from continued skilled PT services to progress her ROM, pain relief and assist in returning patient to her previous level of function in home and at work ? ? ? ? ? ? ? ? ? ? ? ? ? ? ? ? ? ? ? ? ? PT Education - 08/25/21 0750   ? ? Education Details exercise technique, body mechanics   ? Person(s) Educated Patient   ? Methods  Explanation;Demonstration;Tactile cues;Verbal cues   ? Comprehension Verbalized understanding;Returned demonstration;Verbal cues required;Tactile cues required   ? ?  ?  ? ?  ? ? ? PT Short Term Goals - 04/19/21 0726   ? ?  ? PT SHORT TERM GOAL #1  ? Title Pt will be independent with HEP in order to improve strength and decrease neck pain in order to improve pain-free function at home and work.   ? Baseline 10/6: will provide next session 11/21; HEP compliant   ? Time 4   ? Period Weeks   ? Status Achieved   ? Target Date 04/01/21   ? ?  ?  ? ?  ? ? ? ? PT Long Term Goals - 08/18/21 0748   ? ?  ?   PT LONG TERM GOAL #1  ? Title Patient will increase FOTO score to equal to or greater than  61/100   to demonstrate statistically significant improvement in mobility and quality of life.   ? Baseline 10/6: 48% 11/21: 46% 1/16: 50% 1/30: 58% 3/22: 58%   ? Time 8   ? Period Weeks   ? Status Partially Met   ? Target Date 10/13/21   ?  ? PT LONG TERM GOAL #2  ? Title Pt will demonstrate decrease in NDI by at least 19% in order to demonstrate clinically significant reduction in disability related to neck injury/pain   ? Baseline 10/6: will perform next session 11/21: 39% 1/16: 40% 1/30: 28% 3/22: 22%   ? Time 8   ? Period Weeks   ? Status Partially Met   ? Target Date 10/13/21   ?  ? PT LONG TERM GOAL #3  ? Title Pt will decrease worst neck pain as reported on NPRS by at least 2 points (2/10) in order to demonstrate clinically significant reduction in neck pain.   ? Baseline 10/6: 4/10 11/21: 8/10 muscle spasm 1/16; 5/10 1/30: 3/10 3/22: 4/10 flying   ? Time 8   ? Period Weeks   ? Status Partially Met   ? Target Date 10/13/21   ?  ? PT LONG TERM GOAL #4  ? Title Patient will demonstrate improved Cervical ROM by 50% in order to improve her functional ability to turn her head or nod for return of functional capabilities including driving, working, and sleeping.   ? Baseline 10/6: see note 11/21: see note 1/16: see note 1/30: see  note 3/22: see note   ? Time 8   ? Period Weeks   ? Status Partially Met   ? Target Date 10/13/21   ?  ? PT LONG TERM GOAL #5  ? Title Patient will demonstrate decreased frequency of higher pain episodes to improve qual

## 2021-08-26 ENCOUNTER — Other Ambulatory Visit: Payer: Self-pay

## 2021-08-26 DIAGNOSIS — I10 Essential (primary) hypertension: Secondary | ICD-10-CM | POA: Diagnosis not present

## 2021-08-26 DIAGNOSIS — Z0000 Encounter for general adult medical examination without abnormal findings: Secondary | ICD-10-CM | POA: Diagnosis not present

## 2021-08-26 DIAGNOSIS — E7849 Other hyperlipidemia: Secondary | ICD-10-CM | POA: Diagnosis not present

## 2021-08-26 DIAGNOSIS — I2510 Atherosclerotic heart disease of native coronary artery without angina pectoris: Secondary | ICD-10-CM | POA: Diagnosis not present

## 2021-08-26 MED ORDER — PREDNISONE 10 MG PO TABS
ORAL_TABLET | ORAL | 0 refills | Status: DC
  Filled 2021-08-26: qty 20, 8d supply, fill #0

## 2021-09-01 ENCOUNTER — Other Ambulatory Visit: Payer: Self-pay

## 2021-09-01 ENCOUNTER — Ambulatory Visit: Payer: 59

## 2021-09-01 MED ORDER — ZOLPIDEM TARTRATE ER 6.25 MG PO TBCR
EXTENDED_RELEASE_TABLET | ORAL | 5 refills | Status: DC
  Filled 2021-09-01: qty 30, 30d supply, fill #0
  Filled 2021-09-28 – 2021-10-18 (×3): qty 30, 30d supply, fill #1
  Filled 2021-11-16: qty 30, 30d supply, fill #2
  Filled 2021-12-19: qty 30, 30d supply, fill #3
  Filled 2022-01-23: qty 30, 30d supply, fill #4
  Filled 2022-02-20: qty 30, 30d supply, fill #5

## 2021-09-08 ENCOUNTER — Ambulatory Visit: Payer: 59 | Attending: Internal Medicine

## 2021-09-08 DIAGNOSIS — R293 Abnormal posture: Secondary | ICD-10-CM | POA: Diagnosis not present

## 2021-09-08 DIAGNOSIS — M6281 Muscle weakness (generalized): Secondary | ICD-10-CM | POA: Insufficient documentation

## 2021-09-08 DIAGNOSIS — M25522 Pain in left elbow: Secondary | ICD-10-CM | POA: Insufficient documentation

## 2021-09-08 DIAGNOSIS — M542 Cervicalgia: Secondary | ICD-10-CM | POA: Diagnosis not present

## 2021-09-08 NOTE — Therapy (Signed)
[facility] ?[place], [place], [address] ?Phone: [phone]   Fax:  [phone] ? ?Physical Therapy Treatment ? ?Patient Details  ?Name: Lori Morales ?MRN: [id number] ?Date of Birth: [birth year] ?Referring Provider (PT): Tama High III MD ? ? ?Encounter Date: 09/08/2021 ? ? PT End of Session - 09/08/21 0751   ? ? Visit Number 31   ? Number of Visits 37   ? Date for PT Re-Evaluation 10/13/21   ? Authorization Type 1/10 3/29   ? PT Start Time [phone]   ? PT Stop Time 0759   ? PT Time Calculation (min) 45 min   ? Activity Tolerance Patient tolerated treatment well   ? Behavior During Therapy [facility] for tasks assessed/performed   ? ?  ?  ? ?  ? ? ?Past Medical History:  ?Diagnosis Date  ? Arthritis   ? CAD (coronary artery disease), native coronary artery   ? mild non calcified plaque in mid LAD and prox LCx on coronary CTA 01/2019  ? Depression   ? GERD (gastroesophageal reflux disease)   ? Headache   ? migraines  ? Hx of squamous cell carcinoma 07/11/2016  ? Right distal dorsum lat forearm   ? Hypertension   ? IBS (irritable bowel syndrome)   ? Palpitations   ? PFO (patent foramen ovale)   ? small by cardiac CTA 01/2019  ? PONV (postoperative nausea and vomiting)   ? Restless legs syndrome   ? Squamous cell carcinoma of skin 12/202/2017  ? right distal dorsum lateral forearm  ? ? ?Past Surgical History:  ?Procedure Laterality Date  ? ABDOMINOPLASTY    ? BACK SURGERY    ? 1 laminectomy, 1 fusion  ? COLONOSCOPY    ? COLONOSCOPY WITH PROPOFOL N/A 01/17/2017  ? Procedure: COLONOSCOPY WITH PROPOFOL;  Surgeon: Lollie Sails, MD;  Location: [facility] ENDOSCOPY;  Service: Endoscopy;  Laterality: N/A;  ? WISDOM TOOTH EXTRACTION    ? ? ?There were no vitals filed for this visit. ? ? Subjective Assessment - 09/08/21 0748   ? ? Subjective Patient reports she is able to self manage when her pain is increasing much better. Does still have a constant 3/10 pain   ?  Pertinent History Patient is returning back to physical therapy for her neck. Patient has a history of C6-7 arthroplasty and has been treated by this clinician before. PMH includes anxiety, CAD, depression, arthritis of cervical region, gastritis, GERD, cancer, stroke, HLD, HTN, IB, lumbar disc disease (fusion x2 ), migraine, numbness of L hand, headaches, and restless leg syndrome. R sided neck pain with L sided tightness, but no radiating to the arm.   ? Limitations Lifting;Writing;House hold activities;Other (comment);Reading;Sitting   ? How long can you sit comfortably? Increased pain if performing deskwork for > 1/2 day.   ? How long can you stand comfortably? n/a   ? How long can you walk comfortably? n/a   ? Diagnostic tests MRI prior to surgery   ? Patient Stated Goals To be painfree and work without pain or restrictions as well as sleep better.   ? Currently in Pain? Yes   ? Pain Score 3    ? Pain Location Neck   ? Pain Orientation Right;Left   ? Pain Descriptors / Indicators Aching   ? Pain Type Chronic pain   ? Pain Onset More than a month ago   ? Pain Frequency Intermittent   ? ?  ?  ? ?  ? ? ? ? ? ? ?  Treatment:  ?  ?  ?Grade II CPA and UPA thoracic and cervical spine 4x 10 seconds each level ?J mobilization 2x30 second holds  ?Cervical sidebend with gentle overpressure 3x60 second holds each direction  ?Cervical rotation with gentle overpressure 3x 60 second holds each direction  ?  ?Scapular retraction with cervical rotation 10x 5 second holds each direction  ?Towel distraction with cervical extension 10x  ?Overhead Y RTB 15x supine in hooklying position  ? ?Green swiss ball: I's, T's, Y's, 10x each direction; cueing for sequencing ? ?  ?Wall posture: overhead Y raises 10x ? ? ?Pt educated throughout session about proper posture and technique with exercises. Improved exercise technique, movement at target joints, use of target muscles after min to mod verbal, visual, tactile cues.  ?  ? Trigger Point  Dry Needling (TDN), unbilled ?Education performed with patient regarding potential benefit of TDN. Reviewed precautions and risks with patient. Reviewed special precautions/risks over lung fields which include pneumothorax. Reviewed signs and symptoms of pneumothorax and advised pt to go to ER immediately if these symptoms develop advise them of dry needling treatment. Extensive time spent with pt to ensure full understanding of TDN risks. Pt provided verbal consent to treatment. TDN performed to  with 0.25 x .40 single needle placements with local twitch response (LTR) to bilateral upper traps,  cervical paraspinals, L subscap and infraspinatus. Pistoning technique utilized. Improved pain-free motion following intervention. x8 minutes ?  ? ?Patient tolerates progressive strengthening and muscle tissue lengthening interventions well. Multiple trigger points found in bilateral upper traps however her thoracic spine has returned to functional. Patient is highly motivated throughout session. She will benefit from continued skilled PT services to progress her ROM, pain relief and assist in returning patient to her previous level of function in home and at work ? ? ? ? ? ? ? ? ? ? ? ? ? ? ? ? ? PT Education - 09/08/21 0751   ? ? Education Details exercise technique, body mechanics   ? Person(s) Educated Patient   ? Methods Explanation;Demonstration;Tactile cues;Verbal cues   ? Comprehension Returned demonstration;Verbal cues required;Verbalized understanding;Tactile cues required   ? ?  ?  ? ?  ? ? ? PT Short Term Goals - 04/19/21 0726   ? ?  ? PT SHORT TERM GOAL #1  ? Title Pt will be independent with HEP in order to improve strength and decrease neck pain in order to improve pain-free function at home and work.   ? Baseline 10/6: will provide next session 11/21; HEP compliant   ? Time 4   ? Period Weeks   ? Status Achieved   ? Target Date 04/01/21   ? ?  ?  ? ?  ? ? ? ? PT Long Term Goals - 08/18/21 0748   ? ?  ? PT  LONG TERM GOAL #1  ? Title Patient will increase FOTO score to equal to or greater than  61/100   to demonstrate statistically significant improvement in mobility and quality of life.   ? Baseline 10/6: 48% 11/21: 46% 1/16: 50% 1/30: 58% 3/22: 58%   ? Time 8   ? Period Weeks   ? Status Partially Met   ? Target Date 10/13/21   ?  ? PT LONG TERM GOAL #2  ? Title Pt will demonstrate decrease in NDI by at least 19% in order to demonstrate clinically significant reduction in disability related to neck injury/pain   ? Baseline 10/6: will  perform next session 11/21: 39% 1/16: 40% 1/30: 28% 3/22: 22%   ? Time 8   ? Period Weeks   ? Status Partially Met   ? Target Date 10/13/21   ?  ? PT LONG TERM GOAL #3  ? Title Pt will decrease worst neck pain as reported on NPRS by at least 2 points (2/10) in order to demonstrate clinically significant reduction in neck pain.   ? Baseline 10/6: 4/10 11/21: 8/10 muscle spasm 1/16; 5/10 1/30: 3/10 3/22: 4/10 flying   ? Time 8   ? Period Weeks   ? Status Partially Met   ? Target Date 10/13/21   ?  ? PT LONG TERM GOAL #4  ? Title Patient will demonstrate improved Cervical ROM by 50% in order to improve her functional ability to turn her head or nod for return of functional capabilities including driving, working, and sleeping.   ? Baseline 10/6: see note 11/21: see note 1/16: see note 1/30: see note 3/22: see note   ? Time 8   ? Period Weeks   ? Status Partially Met   ? Target Date 10/13/21   ?  ? PT LONG TERM GOAL #5  ? Title Patient will demonstrate decreased frequency of higher pain episodes to improve quality of life and demonstrate improved self maintanance.   ? Baseline 12/1: painful everyday since recent diagnosis 1/16: slight decrease, able to sleep in bed for past three days 1/30: 2-3 x/week of higher pain 3/22: 3 day sof pain of 4/10 pain   ? Time 8   ? Period Weeks   ? Status Partially Met   ? Target Date 10/13/21   ?  ? PT LONG TERM GOAL #6  ? Title Patient will return to leisure  activities including [place] for return to PLOF.   ? Baseline 1/30: not able to quilt yet 3/22: needs to take breaks but has started.   ? Time 8   ? Period Weeks   ? Status Partially Met   ? Target Date 05

## 2021-09-09 ENCOUNTER — Other Ambulatory Visit: Payer: Self-pay

## 2021-09-09 MED ORDER — HYDROCODONE-ACETAMINOPHEN 10-325 MG PO TABS
ORAL_TABLET | ORAL | 0 refills | Status: AC
  Filled 2021-09-09: qty 10, 5d supply, fill #0

## 2021-09-15 ENCOUNTER — Ambulatory Visit: Payer: 59

## 2021-09-15 ENCOUNTER — Other Ambulatory Visit: Payer: Self-pay | Admitting: Internal Medicine

## 2021-09-15 DIAGNOSIS — R293 Abnormal posture: Secondary | ICD-10-CM

## 2021-09-15 DIAGNOSIS — M542 Cervicalgia: Secondary | ICD-10-CM | POA: Diagnosis not present

## 2021-09-15 DIAGNOSIS — M6281 Muscle weakness (generalized): Secondary | ICD-10-CM | POA: Diagnosis not present

## 2021-09-15 DIAGNOSIS — M5090 Cervical disc disorder, unspecified, unspecified cervical region: Secondary | ICD-10-CM

## 2021-09-15 DIAGNOSIS — M25522 Pain in left elbow: Secondary | ICD-10-CM | POA: Diagnosis not present

## 2021-09-15 NOTE — Therapy (Signed)
[facility] ?[place], [place], [address] ?Phone: [phone]   Fax:  [phone] ? ?Physical Therapy Treatment ? ?Patient Details  ?Name: Lori Morales ?MRN: [id number] ?Date of Birth: 1961-02-27 ?Referring Provider (PT): Tama High III MD ? ? ?Encounter Date: 09/15/2021 ? ? PT End of Session - 09/15/21 0715   ? ? Visit Number 32   ? Number of Visits 37   ? Date for PT Re-Evaluation 10/13/21   ? Authorization Type 2/10 3/29   ? PT Start Time 0715   ? PT Stop Time 0759   ? PT Time Calculation (min) 44 min   ? Activity Tolerance Patient tolerated treatment well   ? Behavior During Therapy [facility] for tasks assessed/performed   ? ?  ?  ? ?  ? ? ?Past Medical History:  ?Diagnosis Date  ? Arthritis   ? CAD (coronary artery disease), native coronary artery   ? mild non calcified plaque in mid LAD and prox LCx on coronary CTA 01/2019  ? Depression   ? GERD (gastroesophageal reflux disease)   ? Headache   ? migraines  ? Hx of squamous cell carcinoma 07/11/2016  ? Right distal dorsum lat forearm   ? Hypertension   ? IBS (irritable bowel syndrome)   ? Palpitations   ? PFO (patent foramen ovale)   ? small by cardiac CTA 01/2019  ? PONV (postoperative nausea and vomiting)   ? Restless legs syndrome   ? Squamous cell carcinoma of skin 12/202/2017  ? right distal dorsum lateral forearm  ? ? ?Past Surgical History:  ?Procedure Laterality Date  ? ABDOMINOPLASTY    ? BACK SURGERY    ? 1 laminectomy, 1 fusion  ? COLONOSCOPY    ? COLONOSCOPY WITH PROPOFOL N/A 01/17/2017  ? Procedure: COLONOSCOPY WITH PROPOFOL;  Surgeon: Lollie Sails, MD;  Location: [facility] ENDOSCOPY;  Service: Endoscopy;  Laterality: N/A;  ? WISDOM TOOTH EXTRACTION    ? ? ?There were no vitals filed for this visit. ? ? Subjective Assessment - 09/15/21 0718   ? ? Subjective Patient reports her pain worsens at lunch and gets significantly worse. Had to go home due to the pain. Is potentially getting  another injection. Pain is averaging 5/10 by lunch time.   ? Pertinent History Patient is returning back to physical therapy for her neck. Patient has a history of C6-7 arthroplasty and has been treated by this clinician before. PMH includes anxiety, CAD, depression, arthritis of cervical region, gastritis, GERD, cancer, stroke, HLD, HTN, IB, lumbar disc disease (fusion x2 ), migraine, numbness of L hand, headaches, and restless leg syndrome. R sided neck pain with L sided tightness, but no radiating to the arm.   ? Limitations Lifting;Writing;House hold activities;Other (comment);Reading;Sitting   ? How long can you sit comfortably? Increased pain if performing deskwork for > 1/2 day.   ? How long can you stand comfortably? n/a   ? How long can you walk comfortably? n/a   ? Diagnostic tests MRI prior to surgery   ? Patient Stated Goals To be painfree and work without pain or restrictions as well as sleep better.   ? Currently in Pain? Yes   ? Pain Score 2    ? Pain Location Neck   ? Pain Orientation Right;Left   ? Pain Descriptors / Indicators Aching   ? Pain Type Chronic pain   ? Pain Onset More than a month ago   ?  Pain Frequency Intermittent   ? ?  ?  ? ?  ? ? ?Patient reports her pain worsens at lunch and gets significantly worse. Had to go home due to the pain. Is potentially getting another injection. Pain is averaging 5/10 by lunch time.  ? ? ?Treatment:  ?  ?  ?Grade II CPA and UPA thoracic and cervical spine 5x 10 seconds each level ?J mobilization 3x30 second holds  ?Cervical sidebend with gentle overpressure 3x60 second holds each direction  ?Cervical rotation with gentle overpressure 3x 60 second holds each direction  ?R median nerve glide 15x supine position  ?Overhead Y RTB 15x supine in hooklying position  ? ? seated median nerve glides for HEP ? ?Pt educated throughout session about proper posture and technique with exercises. Improved exercise technique, movement at target joints, use of target  muscles after min to mod verbal, visual, tactile cues.  ?  ? Trigger Point Dry Needling (TDN), unbilled ?Education performed with patient regarding potential benefit of TDN. Reviewed precautions and risks with patient. Reviewed special precautions/risks over lung fields which include pneumothorax. Reviewed signs and symptoms of pneumothorax and advised pt to go to ER immediately if these symptoms develop advise them of dry needling treatment. Extensive time spent with pt to ensure full understanding of TDN risks. Pt provided verbal consent to treatment. TDN performed to  with 0.3 x .30 single needle placements with local twitch response (LTR) to bilateral upper traps,  cervical paraspinals, R wrist flexors/extensors. Pistoning technique utilized. Improved pain-free motion following intervention. x14 minutes ? ? ? ? ? ?Patient has multiple trigger points in wrist flexors and extensors this session. Session focused on pain reduction and reduction of radiating pain. Multiple cavitations to cervical spinal region created with grade II-III mobilizations with patient reporting pain reduction. She will benefit from continued skilled PT services to progress her ROM, pain relief and assist in returning patient to her previous level of function in home and at work ? ? ? ? ? ? ? ? ? ? ? ? ? ? ? ? ? PT Education - 09/15/21 0714   ? ? Education Details exercise technique, body mechanics   ? Person(s) Educated Patient   ? Methods Explanation;Demonstration;Verbal cues;Tactile cues   ? Comprehension Verbalized understanding;Returned demonstration;Verbal cues required;Tactile cues required   ? ?  ?  ? ?  ? ? ? PT Short Term Goals - 04/19/21 0726   ? ?  ? PT SHORT TERM GOAL #1  ? Title Pt will be independent with HEP in order to improve strength and decrease neck pain in order to improve pain-free function at home and work.   ? Baseline 10/6: will provide next session 11/21; HEP compliant   ? Time 4   ? Period Weeks   ? Status  Achieved   ? Target Date 04/01/21   ? ?  ?  ? ?  ? ? ? ? PT Long Term Goals - 08/18/21 0748   ? ?  ? PT LONG TERM GOAL #1  ? Title Patient will increase FOTO score to equal to or greater than  61/100   to demonstrate statistically significant improvement in mobility and quality of life.   ? Baseline 10/6: 48% 11/21: 46% 1/16: 50% 1/30: 58% 3/22: 58%   ? Time 8   ? Period Weeks   ? Status Partially Met   ? Target Date 10/13/21   ?  ? PT LONG TERM GOAL #2  ? Title Pt  will demonstrate decrease in NDI by at least 19% in order to demonstrate clinically significant reduction in disability related to neck injury/pain   ? Baseline 10/6: will perform next session 11/21: 39% 1/16: 40% 1/30: 28% 3/22: 22%   ? Time 8   ? Period Weeks   ? Status Partially Met   ? Target Date 10/13/21   ?  ? PT LONG TERM GOAL #3  ? Title Pt will decrease worst neck pain as reported on NPRS by at least 2 points (2/10) in order to demonstrate clinically significant reduction in neck pain.   ? Baseline 10/6: 4/10 11/21: 8/10 muscle spasm 1/16; 5/10 1/30: 3/10 3/22: 4/10 flying   ? Time 8   ? Period Weeks   ? Status Partially Met   ? Target Date 10/13/21   ?  ? PT LONG TERM GOAL #4  ? Title Patient will demonstrate improved Cervical ROM by 50% in order to improve her functional ability to turn her head or nod for return of functional capabilities including driving, working, and sleeping.   ? Baseline 10/6: see note 11/21: see note 1/16: see note 1/30: see note 3/22: see note   ? Time 8   ? Period Weeks   ? Status Partially Met   ? Target Date 10/13/21   ?  ? PT LONG TERM GOAL #5  ? Title Patient will demonstrate decreased frequency of higher pain episodes to improve quality of life and demonstrate improved self maintanance.   ? Baseline 12/1: painful everyday since recent diagnosis 1/16: slight decrease, able to sleep in bed for past three days 1/30: 2-3 x/week of higher pain 3/22: 3 day sof pain of 4/10 pain   ? Time 8   ? Period Weeks   ? Status  Partially Met   ? Target Date 10/13/21   ?  ? PT LONG TERM GOAL #6  ? Title Patient will return to leisure activities including [place] for return to PLOF.   ? Baseline 1/30: not able to quilt yet 3/22: needs to

## 2021-09-23 ENCOUNTER — Ambulatory Visit: Payer: 59

## 2021-09-23 DIAGNOSIS — R293 Abnormal posture: Secondary | ICD-10-CM | POA: Diagnosis not present

## 2021-09-23 DIAGNOSIS — M25522 Pain in left elbow: Secondary | ICD-10-CM | POA: Diagnosis not present

## 2021-09-23 DIAGNOSIS — M6281 Muscle weakness (generalized): Secondary | ICD-10-CM

## 2021-09-23 DIAGNOSIS — M542 Cervicalgia: Secondary | ICD-10-CM

## 2021-09-23 NOTE — Therapy (Signed)
[facility] ?[place], [place], [address] ?Phone: [phone]   Fax:  [phone] ? ?Physical Therapy Treatment ? ?Patient Details  ?Name: Lori Morales ?MRN: [id number] ?Date of Birth: July 13, 1960 ?Referring Provider (PT): Tama High III MD ? ? ?Encounter Date: 09/23/2021 ? ? PT End of Session - 09/23/21 0804   ? ? Visit Number [id number]   ? Number of Visits 37   ? Date for PT Re-Evaluation 10/13/21   ? Authorization Type 3/10 3/29   ? PT Start Time 0715   ? PT Stop Time 0759   ? PT Time Calculation (min) 44 min   ? Activity Tolerance Patient tolerated treatment well   ? Behavior During Therapy [facility] for tasks assessed/performed   ? ?  ?  ? ?  ? ? ?Past Medical History:  ?Diagnosis Date  ? Arthritis   ? CAD (coronary artery disease), native coronary artery   ? mild non calcified plaque in mid LAD and prox LCx on coronary CTA 01/2019  ? Depression   ? GERD (gastroesophageal reflux disease)   ? Headache   ? migraines  ? Hx of squamous cell carcinoma 07/11/2016  ? Right distal dorsum lat forearm   ? Hypertension   ? IBS (irritable bowel syndrome)   ? Palpitations   ? PFO (patent foramen ovale)   ? small by cardiac CTA 01/2019  ? PONV (postoperative nausea and vomiting)   ? Restless legs syndrome   ? Squamous cell carcinoma of skin 12/202/2017  ? right distal dorsum lateral forearm  ? ? ?Past Surgical History:  ?Procedure Laterality Date  ? ABDOMINOPLASTY    ? BACK SURGERY    ? 1 laminectomy, 1 fusion  ? COLONOSCOPY    ? COLONOSCOPY WITH PROPOFOL N/A 01/17/2017  ? Procedure: COLONOSCOPY WITH PROPOFOL;  Surgeon: Lollie Sails, MD;  Location: [facility] ENDOSCOPY;  Service: Endoscopy;  Laterality: N/A;  ? WISDOM TOOTH EXTRACTION    ? ? ?There were no vitals filed for this visit. ? ? Subjective Assessment - 09/23/21 0717   ? ? Subjective Patient reports her pain has mostly resolved after last session but still having soreness of R side and numbness and tingling.    ? Pertinent History Patient is returning back to physical therapy for her neck. Patient has a history of C6-7 arthroplasty and has been treated by this clinician before. PMH includes anxiety, CAD, depression, arthritis of cervical region, gastritis, GERD, cancer, stroke, HLD, HTN, IB, lumbar disc disease (fusion x2 ), migraine, numbness of L hand, headaches, and restless leg syndrome. R sided neck pain with L sided tightness, but no radiating to the arm.   ? Limitations Lifting;Writing;House hold activities;Other (comment);Reading;Sitting   ? How long can you sit comfortably? Increased pain if performing deskwork for > 1/2 day.   ? How long can you stand comfortably? n/a   ? How long can you walk comfortably? n/a   ? Diagnostic tests MRI prior to surgery   ? Patient Stated Goals To be painfree and work without pain or restrictions as well as sleep better.   ? Currently in Pain? Yes   ? Pain Score 2    ? Pain Location Neck   ? Pain Orientation Right   ? Pain Descriptors / Indicators Aching   ? Pain Type Chronic pain   ? Pain Onset More than a month ago   ? Pain Frequency Intermittent   ? ?  ?  ? ?  ? ? ? ? ? ? ? ?  ?  Treatment:  ?  ?  ?Grade II CPA and UPA thoracic and cervical spine 5x 10 seconds each level ?J mobilization 3x30 second holds  ?Cervical sidebend with gentle overpressure 3x60 second holds each direction  ?Cervical rotation with gentle overpressure 3x 60 second holds each direction  ?R median nerve glide 15x supine position  ?Overhead Y GTB 15x supine in hooklying position  ?Matrix cable row #12.5 12x ?Matrix straight arm lat pull down #7.5 10x each UE ? towel distraction with cervical extension in supine position 10x 3 second holds ? ?Pt educated throughout session about proper posture and technique with exercises. Improved exercise technique, movement at target joints, use of target muscles after min to mod verbal, visual, tactile cues.  ?  ? Trigger Point Dry Needling (TDN), unbilled ?Education  performed with patient regarding potential benefit of TDN. Reviewed precautions and risks with patient. Reviewed special precautions/risks over lung fields which include pneumothorax. Reviewed signs and symptoms of pneumothorax and advised pt to go to ER immediately if these symptoms develop advise them of dry needling treatment. Extensive time spent with pt to ensure full understanding of TDN risks. Pt provided verbal consent to treatment. TDN performed to  with 0.3 x .30 single needle placements with local twitch response (LTR) to bilateral upper traps,  cervical paraspinals, R wrist flexors/extensors. Pistoning technique utilized. Improved pain-free motion following intervention. x12 minutes ? ? ? ?Patient tolerates progressive strengthening and stabilization interventions well with minimal pain increase. Postural strengthening performed with increased resistance. Nerve glides continue to be symptomatic and an area for focus. Patient will be getting an injection next week.  She will benefit from continued skilled PT services to progress her ROM, pain relief and assist in returning patient to her previous level of function in home and at work ? ? ? ? ? ? ? ? ? ? ? ? ? ? ? ? ? PT Education - 09/23/21 0804   ? ? Education Details exercise technique, body mechanics   ? Person(s) Educated Patient   ? Methods Explanation;Demonstration;Tactile cues;Verbal cues   ? Comprehension Verbalized understanding;Returned demonstration;Verbal cues required;Tactile cues required   ? ?  ?  ? ?  ? ? ? PT Short Term Goals - 04/19/21 0726   ? ?  ? PT SHORT TERM GOAL #1  ? Title Pt will be independent with HEP in order to improve strength and decrease neck pain in order to improve pain-free function at home and work.   ? Baseline 10/6: will provide next session 11/21; HEP compliant   ? Time 4   ? Period Weeks   ? Status Achieved   ? Target Date 04/01/21   ? ?  ?  ? ?  ? ? ? ? PT Long Term Goals - 08/18/21 0748   ? ?  ? PT LONG TERM GOAL  #1  ? Title Patient will increase FOTO score to equal to or greater than  61/100   to demonstrate statistically significant improvement in mobility and quality of life.   ? Baseline 10/6: 48% 11/21: 46% 1/16: 50% 1/30: 58% 3/22: 58%   ? Time 8   ? Period Weeks   ? Status Partially Met   ? Target Date 10/13/21   ?  ? PT LONG TERM GOAL #2  ? Title Pt will demonstrate decrease in NDI by at least 19% in order to demonstrate clinically significant reduction in disability related to neck injury/pain   ? Baseline 10/6: will perform next session  11/21: 39% 1/16: 40% 1/30: 28% 3/22: 22%   ? Time 8   ? Period Weeks   ? Status Partially Met   ? Target Date 10/13/21   ?  ? PT LONG TERM GOAL #3  ? Title Pt will decrease worst neck pain as reported on NPRS by at least 2 points (2/10) in order to demonstrate clinically significant reduction in neck pain.   ? Baseline 10/6: 4/10 11/21: 8/10 muscle spasm 1/16; 5/10 1/30: 3/10 3/22: 4/10 flying   ? Time 8   ? Period Weeks   ? Status Partially Met   ? Target Date 10/13/21   ?  ? PT LONG TERM GOAL #4  ? Title Patient will demonstrate improved Cervical ROM by 50% in order to improve her functional ability to turn her head or nod for return of functional capabilities including driving, working, and sleeping.   ? Baseline 10/6: see note 11/21: see note 1/16: see note 1/30: see note 3/22: see note   ? Time 8   ? Period Weeks   ? Status Partially Met   ? Target Date 10/13/21   ?  ? PT LONG TERM GOAL #5  ? Title Patient will demonstrate decreased frequency of higher pain episodes to improve quality of life and demonstrate improved self maintanance.   ? Baseline 12/1: painful everyday since recent diagnosis 1/16: slight decrease, able to sleep in bed for past three days 1/30: 2-3 x/week of higher pain 3/22: 3 day sof pain of 4/10 pain   ? Time 8   ? Period Weeks   ? Status Partially Met   ? Target Date 10/13/21   ?  ? PT LONG TERM GOAL #6  ? Title Patient will return to leisure activities  including [place] for return to PLOF.   ? Baseline 1/30: not able to quilt yet 3/22: needs to take breaks but has started.   ? Time 8   ? Period Weeks   ? Status Partially Met   ? Target Date 10/13/21   ? ?

## 2021-09-28 ENCOUNTER — Other Ambulatory Visit: Payer: Self-pay

## 2021-09-29 ENCOUNTER — Other Ambulatory Visit: Payer: Self-pay

## 2021-09-29 MED ORDER — LOSARTAN POTASSIUM 25 MG PO TABS
25.0000 mg | ORAL_TABLET | Freq: Every day | ORAL | 1 refills | Status: DC
  Filled 2021-09-29: qty 90, 90d supply, fill #0
  Filled 2021-12-19: qty 90, 90d supply, fill #1

## 2021-09-30 ENCOUNTER — Other Ambulatory Visit: Payer: Self-pay

## 2021-09-30 ENCOUNTER — Ambulatory Visit
Admission: RE | Admit: 2021-09-30 | Discharge: 2021-09-30 | Disposition: A | Discharge status: Home / Self Care | Payer: 59 | Source: Ambulatory Visit | Attending: Internal Medicine | Admitting: Internal Medicine

## 2021-09-30 DIAGNOSIS — M47812 Spondylosis without myelopathy or radiculopathy, cervical region: Secondary | ICD-10-CM | POA: Diagnosis not present

## 2021-09-30 DIAGNOSIS — M5090 Cervical disc disorder, unspecified, unspecified cervical region: Secondary | ICD-10-CM

## 2021-09-30 MED ORDER — IOPAMIDOL (ISOVUE-M 300) INJECTION 61%
1.0000 mL | Freq: Once | INTRAMUSCULAR | Status: AC
  Administered 2021-09-30: 1 mL via INTRA_ARTICULAR

## 2021-09-30 MED ORDER — DEXAMETHASONE SODIUM PHOSPHATE 4 MG/ML IJ SOLN
8.0000 mg | Freq: Once | INTRAMUSCULAR | Status: AC
  Administered 2021-09-30: 8 mg via INTRA_ARTICULAR

## 2021-09-30 NOTE — Discharge Instructions (Signed)

## 2021-10-06 ENCOUNTER — Ambulatory Visit: Payer: 59 | Attending: Internal Medicine

## 2021-10-06 DIAGNOSIS — R293 Abnormal posture: Secondary | ICD-10-CM | POA: Diagnosis not present

## 2021-10-06 DIAGNOSIS — M542 Cervicalgia: Secondary | ICD-10-CM | POA: Diagnosis not present

## 2021-10-06 DIAGNOSIS — M6281 Muscle weakness (generalized): Secondary | ICD-10-CM | POA: Diagnosis not present

## 2021-10-06 NOTE — Therapy (Signed)
[facility] ?[place], [place], [address] ?Phone: [phone]   Fax:  [phone] ? ?Physical Therapy Treatment ? ?Patient Details  ?Name: Lori Morales ?MRN: [id number] ?Date of Birth: 04-Feb-1961 ?Referring Provider (PT): Tama High III MD ? ? ?Encounter Date: 10/06/2021 ? ? PT End of Session - 10/06/21 0742   ? ? Visit Number 34   ? Number of Visits 37   ? Date for PT Re-Evaluation 10/13/21   ? Authorization Type 4/10 3/29   ? PT Start Time 0715   ? PT Stop Time 0759   ? PT Time Calculation (min) 44 min   ? Activity Tolerance Patient tolerated treatment well   ? Behavior During Therapy [facility] for tasks assessed/performed   ? ?  ?  ? ?  ? ? ?Past Medical History:  ?Diagnosis Date  ? Arthritis   ? CAD (coronary artery disease), native coronary artery   ? mild non calcified plaque in mid LAD and prox LCx on coronary CTA 01/2019  ? Depression   ? GERD (gastroesophageal reflux disease)   ? Headache   ? migraines  ? Hx of squamous cell carcinoma 07/11/2016  ? Right distal dorsum lat forearm   ? Hypertension   ? IBS (irritable bowel syndrome)   ? Palpitations   ? PFO (patent foramen ovale)   ? small by cardiac CTA 01/2019  ? PONV (postoperative nausea and vomiting)   ? Restless legs syndrome   ? Squamous cell carcinoma of skin 12/202/2017  ? right distal dorsum lateral forearm  ? ? ?Past Surgical History:  ?Procedure Laterality Date  ? ABDOMINOPLASTY    ? BACK SURGERY    ? 1 laminectomy, 1 fusion  ? COLONOSCOPY    ? COLONOSCOPY WITH PROPOFOL N/A 01/17/2017  ? Procedure: COLONOSCOPY WITH PROPOFOL;  Surgeon: Lollie Sails, MD;  Location: [facility] ENDOSCOPY;  Service: Endoscopy;  Laterality: N/A;  ? WISDOM TOOTH EXTRACTION    ? ? ?There were no vitals filed for this visit. ? ? Subjective Assessment - 10/06/21 0718   ? ? Subjective Patient had epidural last week, reports it has helped. Last night woke up with right shoulder and arm numb. Heated it and reduced  spasm.   ? Pertinent History Patient is returning back to physical therapy for her neck. Patient has a history of C6-7 arthroplasty and has been treated by this clinician before. PMH includes anxiety, CAD, depression, arthritis of cervical region, gastritis, GERD, cancer, stroke, HLD, HTN, IB, lumbar disc disease (fusion x2 ), migraine, numbness of L hand, headaches, and restless leg syndrome. R sided neck pain with L sided tightness, but no radiating to the arm.   ? Limitations Lifting;Writing;House hold activities;Other (comment);Reading;Sitting   ? How long can you sit comfortably? Increased pain if performing deskwork for > 1/2 day.   ? How long can you stand comfortably? n/a   ? How long can you walk comfortably? n/a   ? Diagnostic tests MRI prior to surgery   ? Patient Stated Goals To be painfree and work without pain or restrictions as well as sleep better.   ? Pain Score 2    ? Pain Location Knee   ? Pain Orientation Right   ? Pain Descriptors / Indicators Aching   ? Pain Type Chronic pain   ? Pain Onset More than a month ago   ? ?  ?  ? ?  ? ? ? ? ? ?  Patient had epidural last week, reports it has helped. Last night woke up with right shoulder and arm numb. Heated it and reduced spasm.  ? ? ?Treatment:  ?  ?  ?Grade II CPA and UPA thoracic and cervical spine 5x 10 seconds each level ?J mobilization 3x30 second holds  ?Cervical sidebend with gentle overpressure 3x60 second holds each direction  ?Cervical rotation with gentle overpressure 3x 60 second holds each direction  ?R median nerve glide 15x supine position  ?Overhead Y GTB 15x supine in hooklying position  ?Matrix cable row #12.5 12x ?Matrix straight arm lat pull down #7.5 10x each UE ? towel distraction with cervical extension in supine position 10x 3 second holds ?  ?Pt educated throughout session about proper posture and technique with exercises. Improved exercise technique, movement at target joints, use of target muscles after min to mod verbal,  visual, tactile cues.  ?  ? Trigger Point Dry Needling (TDN), unbilled ?Education performed with patient regarding potential benefit of TDN. Reviewed precautions and risks with patient. Reviewed special precautions/risks over lung fields which include pneumothorax. Reviewed signs and symptoms of pneumothorax and advised pt to go to ER immediately if these symptoms develop advise them of dry needling treatment. Extensive time spent with pt to ensure full understanding of TDN risks. Pt provided verbal consent to treatment. TDN performed to  with 0.3 x .30 single needle placements with local twitch response (LTR) to bilateral upper traps,  cervical paraspinals, R wrist flexors/extensors. Pistoning technique utilized. Improved pain-free motion following intervention. x12 minutes ?  ? ? ? ?Patient continues to have radiating symptoms down RUE that is reduced by end of session. Patient continues to be highly motivated throughout session. She does have scapular control with decreased elevation and winging leading to improved posture. She will benefit from continued skilled PT services to progress her ROM, pain relief and assist in returning patient to her previous level of function in home and at work ? ? ? ? ? ? ? ? ? ? ? ? ? ? ? ? PT Education - 10/06/21 0741   ? ? Education Details exercise technique, manual   ? Person(s) Educated Patient   ? Methods Explanation;Demonstration;Tactile cues;Verbal cues   ? Comprehension Verbalized understanding;Returned demonstration;Verbal cues required;Tactile cues required   ? ?  ?  ? ?  ? ? ? PT Short Term Goals - 04/19/21 0726   ? ?  ? PT SHORT TERM GOAL #1  ? Title Pt will be independent with HEP in order to improve strength and decrease neck pain in order to improve pain-free function at home and work.   ? Baseline 10/6: will provide next session 11/21; HEP compliant   ? Time 4   ? Period Weeks   ? Status Achieved   ? Target Date 04/01/21   ? ?  ?  ? ?  ? ? ? ? PT Long Term Goals -  08/18/21 0748   ? ?  ? PT LONG TERM GOAL #1  ? Title Patient will increase FOTO score to equal to or greater than  61/100   to demonstrate statistically significant improvement in mobility and quality of life.   ? Baseline 10/6: 48% 11/21: 46% 1/16: 50% 1/30: 58% 3/22: 58%   ? Time 8   ? Period Weeks   ? Status Partially Met   ? Target Date 10/13/21   ?  ? PT LONG TERM GOAL #2  ? Title Pt will demonstrate decrease in  NDI by at least 19% in order to demonstrate clinically significant reduction in disability related to neck injury/pain   ? Baseline 10/6: will perform next session 11/21: 39% 1/16: 40% 1/30: 28% 3/22: 22%   ? Time 8   ? Period Weeks   ? Status Partially Met   ? Target Date 10/13/21   ?  ? PT LONG TERM GOAL #3  ? Title Pt will decrease worst neck pain as reported on NPRS by at least 2 points (2/10) in order to demonstrate clinically significant reduction in neck pain.   ? Baseline 10/6: 4/10 11/21: 8/10 muscle spasm 1/16; 5/10 1/30: 3/10 3/22: 4/10 flying   ? Time 8   ? Period Weeks   ? Status Partially Met   ? Target Date 10/13/21   ?  ? PT LONG TERM GOAL #4  ? Title Patient will demonstrate improved Cervical ROM by 50% in order to improve her functional ability to turn her head or nod for return of functional capabilities including driving, working, and sleeping.   ? Baseline 10/6: see note 11/21: see note 1/16: see note 1/30: see note 3/22: see note   ? Time 8   ? Period Weeks   ? Status Partially Met   ? Target Date 10/13/21   ?  ? PT LONG TERM GOAL #5  ? Title Patient will demonstrate decreased frequency of higher pain episodes to improve quality of life and demonstrate improved self maintanance.   ? Baseline 12/1: painful everyday since recent diagnosis 1/16: slight decrease, able to sleep in bed for past three days 1/30: 2-3 x/week of higher pain 3/22: 3 day sof pain of 4/10 pain   ? Time 8   ? Period Weeks   ? Status Partially Met   ? Target Date 10/13/21   ?  ? PT LONG TERM GOAL #6  ? Title  Patient will return to leisure activities including [place] for return to PLOF.   ? Baseline 1/30: not able to quilt yet 3/22: needs to take breaks but has started.   ? Time 8   ? Period Weeks   ? Statu

## 2021-10-12 ENCOUNTER — Other Ambulatory Visit: Payer: Self-pay

## 2021-10-18 ENCOUNTER — Other Ambulatory Visit: Payer: Self-pay

## 2021-10-20 ENCOUNTER — Ambulatory Visit: Payer: 59

## 2021-10-20 DIAGNOSIS — M542 Cervicalgia: Secondary | ICD-10-CM

## 2021-10-20 DIAGNOSIS — M6281 Muscle weakness (generalized): Secondary | ICD-10-CM

## 2021-10-20 DIAGNOSIS — R293 Abnormal posture: Secondary | ICD-10-CM

## 2021-10-20 NOTE — Therapy (Signed)
[place] MAIN [facility] SERVICES [address] [place], [place], [address] Phone: [phone]   Fax:  [phone]  Physical Therapy Treatment  Patient Details  Name: Lori Morales MRN: [id number] Date of Birth: 08/17/1960 Referring Provider (PT): Tama High III MD   Encounter Date: 10/20/2021   PT End of Session - 10/20/21 0819     Visit Number 35    Number of Visits 58    Date for PT Re-Evaluation 12/15/21    Authorization Type [employer]    Authorization Time Period 10/20/21-12/15/21    Progress Note Due on Visit 40    PT Start Time 0717    PT Stop Time 0802    PT Time Calculation (min) 45 min    Activity Tolerance Patient tolerated treatment well;No increased pain    Behavior During Therapy WFL for tasks assessed/performed             Past Medical History:  Diagnosis Date   Arthritis    CAD (coronary artery disease), native coronary artery    mild non calcified plaque in mid LAD and prox LCx on coronary CTA 01/2019   Depression    GERD (gastroesophageal reflux disease)    Headache    migraines   Hx of squamous cell carcinoma 07/11/2016   Right distal dorsum lat forearm    Hypertension    IBS (irritable bowel syndrome)    Palpitations    PFO (patent foramen ovale)    small by cardiac CTA 01/2019   PONV (postoperative nausea and vomiting)    Restless legs syndrome    Squamous cell carcinoma of skin 12/202/2017   right distal dorsum lateral forearm    Past Surgical History:  Procedure Laterality Date   ABDOMINOPLASTY     BACK SURGERY     1 laminectomy, 1 fusion   COLONOSCOPY     COLONOSCOPY WITH PROPOFOL N/A 01/17/2017   Procedure: COLONOSCOPY WITH PROPOFOL;  Surgeon: Lollie Sails, MD;  Location: [facility] ENDOSCOPY;  Service: Endoscopy;  Laterality: N/A;   WISDOM TOOTH EXTRACTION      There were no vitals filed for this visit.   Subjective Assessment - 10/20/21 0723     Subjective Pt doing ok today.  She continues to get some relief from her last facet injection, but her neck remains aggravated at times. Pt planted some flowers over weekend which aggravated her left neck into shoulder. She manages well with heat to some degree. Intermittent paresthesias into fingertips 1,2,3 at night only.    Pertinent History Lori Morales is a 72yoF who is returning to physical therapy for recurrent neck pain. Pt has benefited from PT here previously for similar problem. Pt now c/o Rt neck pain with Lt neck  tightness, without radiation to the arm. Pt followed by neurosurgical for Cervical spondylosis without myelopathy, prior ACDF at C6-7.  Underwent Cervical MRI 04/26/21 c C5/6 "Severe right facet arthrosis at C2-3 with associated reactive marrow edema." "Shallow broad base right paracentral disc osteophyte complex mildly flattens and effaces the ventral thecal sac (series 8, image 16). Mild flattening of the ventral cord without cord signal changes. Mild spinal stenosis. Left-sided uncovertebral spurring with resultant mild left C6 foraminal narrowing. 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 PMH GAD, CAD,, CVA with symptoms resolution,  depression, cervical spondylosis s/p C6/7 ACDF and adjacent segment d/o, gastritis, GERD, cancer, HLD, HTN, lumbar spondylosis s/p 2 level fusion, migraine, RLS.    Currently in Pain? Yes    Pain Score 3     Pain Location --   left neck, left posterior shoulder               Cervical ROM 10/20/21 Right  Left  Cervical Rotation (global) 45 45  Cervical Lateral Flexion  18 13  Cervical Extension 31   Cervical Flexion Lacks 3 fingers to sternum    FOTO: 54% NDI: 38%   -TPDN to Left Upper trapezius, bilat suboccipital group -C0/C1 P/ROM Stretch in prone -myofascial release of suboccipital group -ART to left Upper trapeizus c A/ROM Rt cervical rotation -Isometric DNF endurance 5x5sec -manual cervical traction 5x30sec  -short sitting C0/C1 capitla flexion nod mobilization 1x20 bilat          Trigger Point Dry Needling - 10/20/21 0001     Consent Given? Yes    Education Handout Provided Previously provided    Muscles Treated Head and Neck Suboccipitals;Upper trapezius    Upper Trapezius Response Twitch reponse elicited;Palpable increased muscle length    Suboccipitals Response Twitch response elicited;Palpable increased muscle length                   PT Education - 10/20/21 0823     Education Details postural limitations    Person(s) Educated Patient    Methods Explanation;Demonstration    Comprehension Need further instruction;Verbalized understanding              PT Short Term Goals - 04/19/21 0726       PT SHORT TERM GOAL #1   Title Pt will be independent with HEP in order to improve strength and decrease neck pain in order to improve pain-free function at home and work.    Baseline 10/6: will provide next session 11/21; HEP compliant    Time 4    Period Weeks    Status Achieved    Target Date 04/01/21               PT Long Term Goals - 10/20/21 0829       PT LONG TERM GOAL #1   Title Patient will increase FOTO score to equal to or greater than  61/100   to demonstrate statistically significant improvement in mobility and quality of life.    Baseline 10/6: 48% 11/21: 46% 1/16: 50% 1/30: 58% 3/22: 58%; 5/24: 54%    Time 8    Period Weeks    Status On-going    Target Date 12/15/21      PT LONG TERM GOAL #2   Title Pt will demonstrate decrease in NDI by at least 19% in order to demonstrate clinically significant reduction in  disability related to neck injury/pain    Baseline 10/6: will perform next session 11/21: 39% 1/16: 40% 1/30: 28% 3/22: 22%; 10/20/21: 38%    Time 8    Period Weeks    Status On-going    Target Date 12/15/21      PT LONG TERM GOAL #3   Title Pt will decrease worst neck pain as reported on NPRS by at least 2 points (2/10) in order to demonstrate clinically significant reduction in neck pain.  Baseline 10/6: 4/10 11/21: 8/10 muscle spasm 1/16; 5/10 1/30: 3/10 3/22: 4/10; 10/20/21: didsn't ask    Time 8    Period Weeks    Status On-going    Target Date 12/15/21      PT LONG TERM GOAL #4   Title Patient will demonstrate improved Cervical ROM by 50% in order to improve her functional ability to turn her head or nod for return of functional capabilities including driving, working, and sleeping.    Time 8    Period Weeks    Status On-going    Target Date 12/15/21      PT LONG TERM GOAL #5   Title Patient will demonstrate decreased frequency of higher pain episodes to improve quality of life and demonstrate improved self maintanance.    Baseline 12/1: painful everyday since recent diagnosis 1/16: slight decrease, able to sleep in bed for past three days 1/30: 2-3 x/week of higher pain 3/22: 3 day sof pain of 4/10 pain    Time 8    Period Weeks    Status On-going    Target Date 12/15/21                   Plan - 10/20/21 0825     Clinical Impression Statement Pt presenting with aggravation of left neck and shoulder after gardening over weekend, still having paresthesias at night. Performed FOTO, NDI, and ROM. Noted biggest deficits unrelated to imaging studies persists in upper cervicla C0/C1 region, hence attention is brought to soft tissue work, stretch, mobilitzation, and strengthening in this area. Pt has expected soreness at end of session.    Personal Factors and Comorbidities Age;Comorbidity 3+;Past/Current Experience;Profession;Time since onset of injury/illness/exacerbation     Comorbidities anxiety, CAD, depression, arthritis of cervical region, gastritis, GERD, cancer, stroke, HLD, HTN, IB, lumbar disc disease (fusion x2 ), migraine, numbness of L hand, headaches, and restless leg syndrome    Examination-Activity Limitations Bathing;Bed Mobility;Caring for Others;Carry;Dressing;Lift;Reach Overhead;Sleep    Examination-Participation Restrictions Cleaning;Community Activity;Driving;Laundry;Occupation;Yard Work;Meal Prep    Stability/Clinical Decision Making Evolving/Moderate complexity    Clinical Decision Making Moderate    Rehab Potential Good    PT Frequency 1x / week    PT Duration 8 weeks    PT Treatment/Interventions ADLs/Self Care Home Management;Cryotherapy;Electrical Stimulation;Moist Heat;Functional mobility training;Therapeutic activities;Therapeutic exercise;Patient/family education;Manual techniques;Passive range of motion;Dry needling;Taping;Spinal Manipulations;Joint Manipulations;Traction;Ultrasound;Gait training;Stair training;Energy conservation;Visual/perceptual remediation/compensation    PT Next Visit Plan retrain deep neck flexor funciton, capital flexion ROM and stretching.    Consulted and Agree with Plan of Care Patient             Patient will benefit from skilled therapeutic intervention in order to improve the following deficits and impairments:  Decreased activity tolerance, Decreased endurance, Decreased mobility, Decreased range of motion, Decreased strength, Hypomobility, Impaired flexibility, Increased muscle spasms, Impaired UE functional use, Postural dysfunction, Improper body mechanics, Pain  Visit Diagnosis: Muscle weakness (generalized)  Abnormal posture  Cervicalgia     Problem List Patient Active Problem List   Diagnosis Date Noted   PFO (patent foramen ovale)    CAD (coronary artery disease), native coronary artery    Acute CVA (cerebrovascular accident) ([place]) 06/15/2018   Depression 10/16/2017   Migraine  10/16/2017   Hyperglycemia, unspecified 07/10/2017   Hyperglycemia 07/10/2017   H/O dizziness 08/02/2016   Cervical disc disorder with radiculopathy of cervical region 04/12/2016   Nonallopathic lesion of thoracic region 04/12/2016   Nonallopathic lesion of rib cage 04/12/2016   Facet  arthritis of cervical region 03/21/2016   Hyperlipidemia 01/26/2015   Labral tear of shoulder, right, subsequent encounter 12/04/2014   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 [place], [place], [address] Phone: [phone]   Fax:  [phone]  Name: HORACE WISHON MRN: [id number] Date of Birth: Dec 30, 1960

## 2021-10-20 NOTE — Addendum Note (Signed)
Addended by: Etta Grandchild on: 10/20/2021 08:47 AM   Modules accepted: Orders

## 2021-11-03 ENCOUNTER — Ambulatory Visit: Payer: 59 | Attending: Internal Medicine

## 2021-11-03 DIAGNOSIS — M542 Cervicalgia: Secondary | ICD-10-CM | POA: Insufficient documentation

## 2021-11-03 DIAGNOSIS — M6281 Muscle weakness (generalized): Secondary | ICD-10-CM | POA: Diagnosis not present

## 2021-11-03 DIAGNOSIS — R293 Abnormal posture: Secondary | ICD-10-CM | POA: Diagnosis not present

## 2021-11-03 NOTE — Therapy (Signed)
[place] MAIN [facility] SERVICES [address] [place], [place], [address] Phone: [phone]   Fax:  [phone]  Physical Therapy Treatment  Patient Details  Name: Lori Morales MRN: [id number] Date of Birth: 1961/01/05 Referring Provider (PT): Tama High III MD   Encounter Date: 11/03/2021   PT End of Session - 11/03/21 0715     Visit Number 36    Number of Visits 80    Date for PT Re-Evaluation 12/15/21    Authorization Type [employer]    Authorization Time Period 10/20/21-12/15/21    Progress Note Due on Visit 40    PT Start Time 0715    PT Stop Time 0759    PT Time Calculation (min) 44 min    Activity Tolerance Patient tolerated treatment well;No increased pain    Behavior During Therapy WFL for tasks assessed/performed             Past Medical History:  Diagnosis Date   Arthritis    CAD (coronary artery disease), native coronary artery    mild non calcified plaque in mid LAD and prox LCx on coronary CTA 01/2019   Depression    GERD (gastroesophageal reflux disease)    Headache    migraines   Hx of squamous cell carcinoma 07/11/2016   Right distal dorsum lat forearm    Hypertension    IBS (irritable bowel syndrome)    Palpitations    PFO (patent foramen ovale)    small by cardiac CTA 01/2019   PONV (postoperative nausea and vomiting)    Restless legs syndrome    Squamous cell carcinoma of skin 12/202/2017   right distal dorsum lateral forearm    Past Surgical History:  Procedure Laterality Date   ABDOMINOPLASTY     BACK SURGERY     1 laminectomy, 1 fusion   COLONOSCOPY     COLONOSCOPY WITH PROPOFOL N/A 01/17/2017   Procedure: COLONOSCOPY WITH PROPOFOL;  Surgeon: Lollie Sails, MD;  Location: [facility] ENDOSCOPY;  Service: Endoscopy;  Laterality: N/A;   WISDOM TOOTH EXTRACTION      There were no vitals filed for this visit.   Subjective Assessment - 11/03/21 0739     Subjective Patient reports her  pain has improved after last session. Has not done gardening.    Pertinent History Lori Morales is a 79yoF who is returning to physical therapy for recurrent neck pain. Pt has benefited from PT here previously for similar problem. Pt now c/o Rt neck pain with Lt neck  tightness, without radiation to the arm. Pt followed by neurosurgical for Cervical spondylosis without myelopathy, prior ACDF at C6-7.  Underwent Cervical MRI 04/26/21 c C5/6 "Severe right facet arthrosis at C2-3 with associated reactive marrow edema." "Shallow broad base right paracentral disc osteophyte complex mildly flattens and effaces the ventral thecal sac (series 8, image 16). Mild flattening of the ventral cord without cord signal changes. Mild spinal stenosis. Left-sided uncovertebral spurring with resultant mild left C6 foraminal narrowing. 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 PMH GAD, CAD,, CVA with symptoms resolution,  depression, cervical spondylosis s/p C6/7 ACDF and adjacent segment d/o, gastritis,  GERD, cancer, HLD, HTN, lumbar spondylosis s/p 2 level fusion, migraine, RLS.    Currently in Pain? Yes    Pain Score 2     Pain Location Neck    Pain Orientation Left;Right    Pain Descriptors / Indicators Aching    Pain Onset More than a month ago    Pain Frequency Intermittent                 Treatment:      Grade II CPA and UPA thoracic and cervical spine 5x 10 seconds each level J mobilization 3x30 second holds  Cervical sidebend with gentle overpressure 3x60 second holds each direction  Cervical rotation with gentle overpressure 3x 60  second holds each direction  R median nerve glide 15x supine position  Overhead Y GTB 15x supine in hooklying position  Matrix cable row #12.5 12x Matrix straight arm lat pull down #7.5 10x each UE  towel distraction with cervical extension in supine position 10x 3 second holds   Pt educated throughout session about proper posture and technique with exercises. Improved exercise technique, movement at target joints, use of target muscles after min to mod verbal, visual, tactile cues.     Trigger Point Dry Needling (TDN), unbilled Education performed with patient regarding potential benefit of TDN. Reviewed precautions and risks with patient. Reviewed special precautions/risks over lung fields which include pneumothorax. Reviewed signs and symptoms of pneumothorax and advised pt to go to ER immediately if these symptoms develop advise them of dry needling treatment. Extensive time spent with pt to ensure full understanding of TDN risks. Pt provided verbal consent to treatment. TDN performed to  with 0.3 x .30 single needle placements with local twitch response (LTR) to bilateral upper traps,  cervical paraspinals.  Pistoning technique utilized. Improved pain-free motion following intervention. x8 minutes    Patient presents with bilateral trap tension that is reduced with TDN. Patient is highly motivated for progression of care at this time. Strengthening tolerated well with cueing for reducing scapular elevation. Patient encouraged to follow up with second opinion as pain continues to persist. She will benefit from continued skilled PT services to progress her ROM, pain relief and assist in returning patient to her previous level of function in home and at work                     PT Education - 11/03/21 0715     Education Details exercise technique, TDN    Person(s) Educated Patient    Methods Explanation;Demonstration;Tactile cues;Verbal cues    Comprehension Verbalized  understanding;Returned demonstration;Verbal cues required;Tactile cues required              PT Short Term Goals - 04/19/21 0726       PT SHORT TERM GOAL #1   Title Pt will be independent with HEP in order to improve strength and decrease neck pain in order to improve pain-free function at home and work.    Baseline 10/6: will provide next session 11/21; HEP compliant    Time 4    Period Weeks    Status Achieved    Target Date 04/01/21               PT Long Term Goals - 10/20/21 0829       PT LONG TERM GOAL #1   Title Patient will increase FOTO score to equal to or greater than  61/100   to demonstrate statistically significant improvement in mobility  and quality of life.    Baseline 10/6: 48% 11/21: 46% 1/16: 50% 1/30: 58% 3/22: 58%; 5/24: 54%    Time 8    Period Weeks    Status On-going    Target Date 12/15/21      PT LONG TERM GOAL #2   Title Pt will demonstrate decrease in NDI by at least 19% in order to demonstrate clinically significant reduction in disability related to neck injury/pain    Baseline 10/6: will perform next session 11/21: 39% 1/16: 40% 1/30: 28% 3/22: 22%; 10/20/21: 38%    Time 8    Period Weeks    Status On-going    Target Date 12/15/21      PT LONG TERM GOAL #3   Title Pt will decrease worst neck pain as reported on NPRS by at least 2 points (2/10) in order to demonstrate clinically significant reduction in neck pain.    Baseline 10/6: 4/10 11/21: 8/10 muscle spasm 1/16; 5/10 1/30: 3/10 3/22: 4/10; 10/20/21: didsn't ask    Time 8    Period Weeks    Status On-going    Target Date 12/15/21      PT LONG TERM GOAL #4   Title Patient will demonstrate improved Cervical ROM by 50% in order to improve her functional ability to turn her head or nod for return of functional capabilities including driving, working, and sleeping.    Time 8    Period Weeks    Status On-going    Target Date 12/15/21      PT LONG TERM GOAL #5   Title Patient will  demonstrate decreased frequency of higher pain episodes to improve quality of life and demonstrate improved self maintanance.    Baseline 12/1: painful everyday since recent diagnosis 1/16: slight decrease, able to sleep in bed for past three days 1/30: 2-3 x/week of higher pain 3/22: 3 day sof pain of 4/10 pain    Time 8    Period Weeks    Status On-going    Target Date 12/15/21                   Plan - 11/03/21 0756     Clinical Impression Statement Patient presents with bilateral trap tension that is reduced with TDN. Patient is highly motivated for progression of care at this time. Strengthening tolerated well with cueing for reducing scapular elevation. Patient encouraged to follow up with second opinion as pain continues to persist. She will benefit from continued skilled PT services to progress her ROM, pain relief and assist in returning patient to her previous level of function in home and at work    Personal Factors and Comorbidities Age;Comorbidity 3+;Past/Current Experience;Profession;Time since onset of injury/illness/exacerbation    Comorbidities anxiety, CAD, depression, arthritis of cervical region, gastritis, GERD, cancer, stroke, HLD, HTN, IB, lumbar disc disease (fusion x2 ), migraine, numbness of L hand, headaches, and restless leg syndrome    Examination-Activity Limitations Bathing;Bed Mobility;Caring for Others;Carry;Dressing;Lift;Reach Overhead;Sleep    Examination-Participation Restrictions Cleaning;Community Activity;Driving;Laundry;Occupation;Yard Work;Meal Prep    Stability/Clinical Decision Making Evolving/Moderate complexity    Rehab Potential Good    PT Frequency 1x / week    PT Duration 8 weeks    PT Treatment/Interventions ADLs/Self Care Home Management;Cryotherapy;Electrical Stimulation;Moist Heat;Functional mobility training;Therapeutic activities;Therapeutic exercise;Patient/family education;Manual techniques;Passive range of motion;Dry  needling;Taping;Spinal Manipulations;Joint Manipulations;Traction;Ultrasound;Gait training;Stair training;Energy conservation;Visual/perceptual remediation/compensation    PT Next Visit Plan retrain deep neck flexor funciton, capital flexion ROM and stretching.    Consulted and  Agree with Plan of Care Patient             Patient will benefit from skilled therapeutic intervention in order to improve the following deficits and impairments:  Decreased activity tolerance, Decreased endurance, Decreased mobility, Decreased range of motion, Decreased strength, Hypomobility, Impaired flexibility, Increased muscle spasms, Impaired UE functional use, Postural dysfunction, Improper body mechanics, Pain  Visit Diagnosis: Muscle weakness (generalized)  Abnormal posture  Cervicalgia     Problem List Patient Active Problem List   Diagnosis Date Noted   PFO (patent foramen ovale)    CAD (coronary artery disease), native coronary artery    Acute CVA (cerebrovascular accident) ([place]) 06/15/2018   Depression 10/16/2017   Migraine 10/16/2017   Hyperglycemia, unspecified 07/10/2017   Hyperglycemia 07/10/2017   H/O dizziness 08/02/2016   Cervical disc disorder with radiculopathy of cervical region 04/12/2016   Nonallopathic lesion of thoracic region 04/12/2016   Nonallopathic lesion of rib cage 04/12/2016   Facet arthritis of cervical region 03/21/2016   Hyperlipidemia 01/26/2015   Labral tear of shoulder, right, subsequent encounter 12/04/2014   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

## 2021-11-14 ENCOUNTER — Other Ambulatory Visit: Payer: Self-pay

## 2021-11-15 ENCOUNTER — Other Ambulatory Visit: Payer: Self-pay

## 2021-11-16 ENCOUNTER — Other Ambulatory Visit: Payer: Self-pay

## 2021-11-16 NOTE — Therapy (Signed)
OUTPATIENT PHYSICAL THERAPY TREATMENT NOTE   Patient Name: Lori Morales MRN: [id number] DOB:Feb 09, 1961, 61 y.o., female Today's Date: 11/17/2021  PCP: Adin Hector, MD REFERRING PROVIDER: Adin Hector, MD   PT End of Session - 11/17/21 0717     Visit Number 37    Number of Visits 54    Date for PT Re-Evaluation 12/15/21    Authorization Type [employer]    Authorization Time Period 10/20/21-12/15/21    Progress Note Due on Visit 40    PT Start Time 0716    PT Stop Time 0759    PT Time Calculation (min) 43 min    Activity Tolerance Patient tolerated treatment well;No increased pain    Behavior During Therapy WFL for tasks assessed/performed             Past Medical History:  Diagnosis Date   Arthritis    CAD (coronary artery disease), native coronary artery    mild non calcified plaque in mid LAD and prox LCx on coronary CTA 01/2019   Depression    GERD (gastroesophageal reflux disease)    Headache    migraines   Hx of squamous cell carcinoma 07/11/2016   Right distal dorsum lat forearm    Hypertension    IBS (irritable bowel syndrome)    Palpitations    PFO (patent foramen ovale)    small by cardiac CTA 01/2019   PONV (postoperative nausea and vomiting)    Restless legs syndrome    Squamous cell carcinoma of skin 12/202/2017   right distal dorsum lateral forearm   Past Surgical History:  Procedure Laterality Date   ABDOMINOPLASTY     BACK SURGERY     1 laminectomy, 1 fusion   COLONOSCOPY     COLONOSCOPY WITH PROPOFOL N/A 01/17/2017   Procedure: COLONOSCOPY WITH PROPOFOL;  Surgeon: Lollie Sails, MD;  Location: [facility] ENDOSCOPY;  Service: Endoscopy;  Laterality: N/A;   WISDOM TOOTH EXTRACTION     Patient Active Problem List   Diagnosis Date Noted   PFO (patent foramen ovale)    CAD (coronary artery disease), native coronary artery    Acute CVA (cerebrovascular accident) ([place]) 06/15/2018   Depression 10/16/2017   Migraine 10/16/2017    Hyperglycemia, unspecified 07/10/2017   Hyperglycemia 07/10/2017   H/O dizziness 08/02/2016   Cervical disc disorder with radiculopathy of cervical region 04/12/2016   Nonallopathic lesion of thoracic region 04/12/2016   Nonallopathic lesion of rib cage 04/12/2016   Facet arthritis of cervical region 03/21/2016   Hyperlipidemia 01/26/2015   Labral tear of shoulder, right, subsequent encounter 12/04/2014   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 Patient has a history of C6-7 arthroplasty and has been treated by this clinician before. PMH includes anxiety, CAD, depression, arthritis of cervical region, gastritis, GERD, cancer, stroke, HLD, HTN, IB, lumbar disc disease (fusion x2 ), migraine, numbness of L hand, headaches, and restless leg syndrome. R sided neck pain with L sided tightness, but no radiating to the arm.  PRECAUTIONS:  cervical   SUBJECTIVE: Patient reports her neck has been feeling better. Is having a 1/10 pain today. Is leaving for the beach today.   PAIN:  Are you having pain? Yes: NPRS scale: 1/10 Pain location: bilateral cervical  Pain description: aching  Aggravating factors: yardwork, knitting Relieving factors: heat, rest      TODAY'S TREATMENT:   11/17/2021    Treatment:      Grade II CPA and UPA thoracic and cervical spine 5x 10 seconds each level J mobilization 3x30 second holds  Cervical sidebend with gentle overpressure 3x60  second holds each direction  Cervical rotation with gentle overpressure 3x 60 second holds each direction  L shoulder AP, PA mobilization x 3 minutes R median nerve glide 15x supine position  Overhead Y GTB 15x supine in hooklying position   towel distraction with cervical extension in supine position 10x 3 second holds  towel distraction side bend seated 10x   Pt educated throughout session about proper posture and technique with exercises. Improved exercise technique, movement at target joints, use of target muscles after min to mod verbal, visual, tactile cues.    Trigger Point Dry Needling (TDN), unbilled Education performed with patient regarding potential benefit of TDN. Reviewed precautions and risks with patient. Reviewed special precautions/risks over lung fields which include pneumothorax. Reviewed signs and symptoms of pneumothorax and advised pt to go to ER immediately if these symptoms develop advise them of dry needling treatment. Extensive time spent with pt to ensure full understanding of TDN risks. Pt provided verbal consent to treatment. TDN performed to  with 0.3 x .30 single needle placements with local twitch response (LTR) to bilateral upper traps,  cervical paraspinals.  Pistoning technique utilized. Improved pain-free motion following intervention. x8 minutes  PATIENT EDUCATION: Education details: exercise technique, body mechanics Person educated: Patient Education method: Explanation, Demonstration, Tactile cues, and Verbal cues Education comprehension: verbalized understanding, returned demonstration, verbal cues required, and tactile cues required   HOME EXERCISE PROGRAM: Posture education and technique for trip. Scapular retraction.    PT Short Term Goals      PT SHORT TERM GOAL #1   Title Pt will be independent with HEP in order to improve strength and decrease neck pain in order to improve pain-free function at home and work.    Baseline 10/6: will provide  next session 11/21; HEP compliant    Time 4    Period Weeks    Status Achieved    Target Date 04/01/21              PT Long Term Goals       PT LONG TERM GOAL #1   Title Patient will increase FOTO score to equal to or greater than  61/100   to demonstrate statistically significant improvement in mobility and quality of life.    Baseline 10/6: 48% 11/21: 46% 1/16: 50% 1/30: 58% 3/22: 58%; 5/24: 54%    Time 8    Period Weeks    Status On-going    Target Date 12/15/21      PT LONG TERM GOAL #2   Title Pt will demonstrate decrease in NDI by at least 19% in order to demonstrate clinically significant reduction in disability related to neck injury/pain    Baseline 10/6: will perform next session 11/21: 39% 1/16: 40% 1/30: 28% 3/22: 22%; 10/20/21: 38%    Time 8    Period Weeks    Status On-going    Target Date 12/15/21  PT LONG TERM GOAL #3   Title Pt will decrease worst neck pain as reported on NPRS by at least 2 points (2/10) in order to demonstrate clinically significant reduction in neck pain.    Baseline 10/6: 4/10 11/21: 8/10 muscle spasm 1/16; 5/10 1/30: 3/10 3/22: 4/10; 10/20/21: didsn't ask    Time 8    Period Weeks    Status On-going    Target Date 12/15/21      PT LONG TERM GOAL #4   Title Patient will demonstrate improved Cervical ROM by 50% in order to improve her functional ability to turn her head or nod for return of functional capabilities including driving, working, and sleeping.    Time 8    Period Weeks    Status On-going    Target Date 12/15/21      PT LONG TERM GOAL #5   Title Patient will demonstrate decreased frequency of higher pain episodes to improve quality of life and demonstrate improved self maintanance.    Baseline 12/1: painful everyday since recent diagnosis 1/16: slight decrease, able to sleep in bed for past three days 1/30: 2-3 x/week of higher pain 3/22: 3 day sof pain of 4/10 pain    Time 8    Period Weeks    Status On-going     Target Date 12/15/21              Plan -    Clinical Impression Statement Patient presents with excellent motivation throughout physical therapy session. She has significant trigger points bilaterally with R upper trap > L upper trap. Postural eduction for upcoming trip performed and tolerated. Nerve glides released tension of RUE. She will benefit from continued skilled PT services to progress her ROM, pain relief and assist in returning patient to her previous level of function in home and at work    Personal Factors and Comorbidities Age;Comorbidity 3+;Past/Current Experience;Profession;Time since onset of injury/illness/exacerbation    Comorbidities anxiety, CAD, depression, arthritis of cervical region, gastritis, GERD, cancer, stroke, HLD, HTN, IB, lumbar disc disease (fusion x2 ), migraine, numbness of L hand, headaches, and restless leg syndrome    Examination-Activity Limitations Bathing;Bed Mobility;Caring for Others;Carry;Dressing;Lift;Reach Overhead;Sleep    Examination-Participation Restrictions Cleaning;Community Activity;Driving;Laundry;Occupation;Yard Work;Meal Prep    Stability/Clinical Decision Making Evolving/Moderate complexity    Rehab Potential Good    PT Frequency 1x / week    PT Duration 8 weeks    PT Treatment/Interventions ADLs/Self Care Home Management;Cryotherapy;Electrical Stimulation;Moist Heat;Functional mobility training;Therapeutic activities;Therapeutic exercise;Patient/family education;Manual techniques;Passive range of motion;Dry needling;Taping;Spinal Manipulations;Joint Manipulations;Traction;Ultrasound;Gait training;Stair training;Energy conservation;Visual/perceptual remediation/compensation    PT Next Visit Plan retrain deep neck flexor funciton, capital flexion ROM and stretching.    Consulted and Agree with Plan of Care Patient               Janna Arch, PT, DPT  11/17/2021, 7:59 AM

## 2021-11-17 ENCOUNTER — Ambulatory Visit: Payer: 59

## 2021-11-17 DIAGNOSIS — M542 Cervicalgia: Secondary | ICD-10-CM | POA: Diagnosis not present

## 2021-11-17 DIAGNOSIS — R293 Abnormal posture: Secondary | ICD-10-CM | POA: Diagnosis not present

## 2021-11-17 DIAGNOSIS — M6281 Muscle weakness (generalized): Secondary | ICD-10-CM

## 2021-11-25 NOTE — Therapy (Signed)
OUTPATIENT PHYSICAL THERAPY TREATMENT NOTE   Patient Name: Lori Morales MRN: [id number] DOB:[birth year], 61 y.o., female Today's Date: 11/29/2021  PCP: Adin Hector, MD REFERRING PROVIDER: Adin Hector, MD   PT End of Session - 11/29/21 0740     Visit Number 27    Number of Visits 61    Date for PT Re-Evaluation 12/15/21    Authorization Type [employer]    Authorization Time Period 10/20/21-12/15/21    Progress Note Due on Visit 40    PT Start Time 0718    PT Stop Time 0800    PT Time Calculation (min) 42 min    Activity Tolerance Patient tolerated treatment well;No increased pain    Behavior During Therapy WFL for tasks assessed/performed              Past Medical History:  Diagnosis Date   Arthritis    CAD (coronary artery disease), native coronary artery    mild non calcified plaque in mid LAD and prox LCx on coronary CTA 01/2019   Depression    GERD (gastroesophageal reflux disease)    Headache    migraines   Hx of squamous cell carcinoma 07/11/2016   Right distal dorsum lat forearm    Hypertension    IBS (irritable bowel syndrome)    Palpitations    PFO (patent foramen ovale)    small by cardiac CTA 01/2019   PONV (postoperative nausea and vomiting)    Restless legs syndrome    Squamous cell carcinoma of skin 12/202/2017   right distal dorsum lateral forearm   Past Surgical History:  Procedure Laterality Date   ABDOMINOPLASTY     BACK SURGERY     1 laminectomy, 1 fusion   COLONOSCOPY     COLONOSCOPY WITH PROPOFOL N/A 01/17/2017   Procedure: COLONOSCOPY WITH PROPOFOL;  Surgeon: Lollie Sails, MD;  Location: [facility] ENDOSCOPY;  Service: Endoscopy;  Laterality: N/A;   WISDOM TOOTH EXTRACTION     Patient Active Problem List   Diagnosis Date Noted   PFO (patent foramen ovale)    CAD (coronary artery disease), native coronary artery    Acute CVA (cerebrovascular accident) ([place]) 06/15/2018   Depression 10/16/2017   Migraine 10/16/2017    Hyperglycemia, unspecified 07/10/2017   Hyperglycemia 07/10/2017   H/O dizziness 08/02/2016   Cervical disc disorder with radiculopathy of cervical region 04/12/2016   Nonallopathic lesion of thoracic region 04/12/2016   Nonallopathic lesion of rib cage 04/12/2016   Facet arthritis of cervical region 03/21/2016   Hyperlipidemia 01/26/2015   Labral tear of shoulder, right, subsequent encounter 12/04/2014   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 Patient has a history of C6-7 arthroplasty and has been treated by this clinician before. PMH includes anxiety, CAD, depression, arthritis of cervical region, gastritis, GERD, cancer, stroke, HLD, HTN, IB, lumbar disc disease (fusion x2 ), migraine, numbness of L hand, headaches, and restless leg syndrome. R sided neck pain with L sided tightness, but no radiating to the arm.  PRECAUTIONS: cervical   SUBJECTIVE: Patient returning from trip to [place]. Drove down and up, had multiple days of 6/10 pain from lifting grandson.  PAIN:  Are you having pain? Yes: NPRS scale: 3/10 Pain location: bilateral cervical  Pain description: aching  Aggravating factors: yardwork, knitting Relieving factors: heat, rest      TODAY'S TREATMENT:       Treatment:      Grade II CPA and UPA thoracic and cervical spine 5x 10 seconds each level J mobilization 3x30 second holds  Suboccipital release 3x30 seconds R median nerve glide 15x  supine position  On half foam roller:  -Overhead Y GTB 15x supine in hooklying position  -robber stretch 30 seconds x2 trials -arnold with pectoral stretch 10x  -L stretch 2x 30 seconds -overhead PVC pipe raise 10x -GTB standing straight arm lat pull down 10x  Pt educated throughout session about proper posture and technique with exercises. Improved exercise technique, movement at target joints, use of target muscles after min to mod verbal, visual, tactile cues.    Trigger Point Dry Needling (TDN), unbilled Education performed with patient regarding potential benefit of TDN. Reviewed precautions and risks with patient. Reviewed special precautions/risks over lung fields which include pneumothorax. Reviewed signs and symptoms of pneumothorax and advised pt to go to ER immediately if these symptoms develop advise them of dry needling treatment. Extensive time spent with pt to ensure full understanding of TDN risks. Pt provided verbal consent to treatment. TDN performed to  with 0.3 x .30 single needle placements with local twitch response (LTR) to bilateral upper traps, .  Pistoning technique utilized. Improved pain-free motion following intervention. x10 minutes  PATIENT EDUCATION: Education details: exercise technique, body mechanics Person educated: Patient Education method: Explanation, Demonstration, Tactile cues, and Verbal cues Education comprehension: verbalized understanding, returned demonstration, verbal cues required, and tactile cues required   HOME EXERCISE PROGRAM: Posture education and technique for trip. Scapular retraction.    PT Short Term Goals      PT SHORT TERM GOAL #1   Title Pt will be independent with HEP in order to improve strength and decrease neck pain in order to improve pain-free function at home and work.    Baseline 10/6: will provide next session 11/21; HEP compliant    Time 4    Period Weeks    Status Achieved    Target Date 04/01/21               PT Long Term Goals       PT LONG TERM GOAL #1   Title Patient will increase FOTO score to equal to or greater than  61/100   to demonstrate statistically significant improvement in mobility and quality of life.    Baseline 10/6: 48% 11/21: 46% 1/16: 50% 1/30: 58% 3/22: 58%; 5/24: 54%    Time 8    Period Weeks    Status On-going    Target Date 12/15/21      PT LONG TERM GOAL #2   Title Pt will demonstrate decrease in NDI by at least 19% in order to demonstrate clinically significant reduction in disability related to neck injury/pain    Baseline 10/6: will perform next session 11/21: 39% 1/16: 40% 1/30: 28% 3/22: 22%; 10/20/21: 38%    Time 8    Period Weeks    Status On-going    Target Date 12/15/21      PT LONG TERM GOAL #3   Title Pt will decrease worst neck  pain as reported on NPRS by at least 2 points (2/10) in order to demonstrate clinically significant reduction in neck pain.    Baseline 10/6: 4/10 11/21: 8/10 muscle spasm 1/16; 5/10 1/30: 3/10 3/22: 4/10; 10/20/21: didsn't ask    Time 8    Period Weeks    Status On-going    Target Date 12/15/21      PT LONG TERM GOAL #4   Title Patient will demonstrate improved Cervical ROM by 50% in order to improve her functional ability to turn her head or nod for return of functional capabilities including driving, working, and sleeping.    Time 8    Period Weeks    Status On-going    Target Date 12/15/21      PT LONG TERM GOAL #5   Title Patient will demonstrate decreased frequency of higher pain episodes to improve quality of life and demonstrate improved self maintanance.    Baseline 12/1: painful everyday since recent diagnosis 1/16: slight decrease, able to sleep in bed for past three days 1/30: 2-3 x/week of higher pain 3/22: 3 day sof pain of 4/10 pain    Time 8    Period Weeks    Status On-going    Target Date 12/15/21              Plan -    Clinical Impression Statement Patient presents with increased tension  in bilateral upper trap. Large trigger pints released. Increased pectoral tension noted and released with therex postural interventions. Patient reports relief of symptoms by end of session. She will benefit from continued skilled PT services to progress her ROM, pain relief and assist in returning patient to her previous level of function in home and at work    Personal Factors and Comorbidities Age;Comorbidity 3+;Past/Current Experience;Profession;Time since onset of injury/illness/exacerbation    Comorbidities anxiety, CAD, depression, arthritis of cervical region, gastritis, GERD, cancer, stroke, HLD, HTN, IB, lumbar disc disease (fusion x2 ), migraine, numbness of L hand, headaches, and restless leg syndrome    Examination-Activity Limitations Bathing;Bed Mobility;Caring for Others;Carry;Dressing;Lift;Reach Overhead;Sleep    Examination-Participation Restrictions Cleaning;Community Activity;Driving;Laundry;Occupation;Yard Work;Meal Prep    Stability/Clinical Decision Making Evolving/Moderate complexity    Rehab Potential Good    PT Frequency 1x / week    PT Duration 8 weeks    PT Treatment/Interventions ADLs/Self Care Home Management;Cryotherapy;Electrical Stimulation;Moist Heat;Functional mobility training;Therapeutic activities;Therapeutic exercise;Patient/family education;Manual techniques;Passive range of motion;Dry needling;Taping;Spinal Manipulations;Joint Manipulations;Traction;Ultrasound;Gait training;Stair training;Energy conservation;Visual/perceptual remediation/compensation    PT Next Visit Plan retrain deep neck flexor funciton, capital flexion ROM and stretching.    Consulted and Agree with Plan of Care Patient               Janna Arch, PT, DPT  11/29/2021, 8:00 AM

## 2021-11-29 ENCOUNTER — Ambulatory Visit: Payer: 59 | Attending: Internal Medicine

## 2021-11-29 DIAGNOSIS — M6281 Muscle weakness (generalized): Secondary | ICD-10-CM | POA: Diagnosis not present

## 2021-11-29 DIAGNOSIS — R293 Abnormal posture: Secondary | ICD-10-CM

## 2021-11-29 DIAGNOSIS — M542 Cervicalgia: Secondary | ICD-10-CM

## 2021-12-09 NOTE — Therapy (Signed)
OUTPATIENT PHYSICAL THERAPY TREATMENT NOTE/RECERT   Patient Name: Lori Morales MRN: [id number] DOB:[birth year], 61 y.o., female Today's Date: 12/13/2021  PCP: Adin Hector, MD REFERRING PROVIDER: Adin Hector, MD   PT End of Session - 12/13/21 0729     Visit Number 39    Number of Visits 36    Date for PT Re-Evaluation 01/10/22    Authorization Type [employer]    Authorization Time Period 10/20/21-12/15/21    Progress Note Due on Visit 40    PT Start Time 0715    PT Stop Time 0759    PT Time Calculation (min) 44 min    Activity Tolerance Patient tolerated treatment well;No increased pain    Behavior During Therapy WFL for tasks assessed/performed               Past Medical History:  Diagnosis Date   Arthritis    CAD (coronary artery disease), native coronary artery    mild non calcified plaque in mid LAD and prox LCx on coronary CTA 01/2019   Depression    GERD (gastroesophageal reflux disease)    Headache    migraines   Hx of squamous cell carcinoma 07/11/2016   Right distal dorsum lat forearm    Hypertension    IBS (irritable bowel syndrome)    Palpitations    PFO (patent foramen ovale)    small by cardiac CTA 01/2019   PONV (postoperative nausea and vomiting)    Restless legs syndrome    Squamous cell carcinoma of skin 12/202/2017   right distal dorsum lateral forearm   Past Surgical History:  Procedure Laterality Date   ABDOMINOPLASTY     BACK SURGERY     1 laminectomy, 1 fusion   COLONOSCOPY     COLONOSCOPY WITH PROPOFOL N/A 01/17/2017   Procedure: COLONOSCOPY WITH PROPOFOL;  Surgeon: Lollie Sails, MD;  Location: [facility] ENDOSCOPY;  Service: Endoscopy;  Laterality: N/A;   WISDOM TOOTH EXTRACTION     Patient Active Problem List   Diagnosis Date Noted   PFO (patent foramen ovale)    CAD (coronary artery disease), native coronary artery    Acute CVA (cerebrovascular accident) ([place]) 06/15/2018   Depression 10/16/2017   Migraine  10/16/2017   Hyperglycemia, unspecified 07/10/2017   Hyperglycemia 07/10/2017   H/O dizziness 08/02/2016   Cervical disc disorder with radiculopathy of cervical region 04/12/2016   Nonallopathic lesion of thoracic region 04/12/2016   Nonallopathic lesion of rib cage 04/12/2016   Facet arthritis of cervical region 03/21/2016   Hyperlipidemia 01/26/2015   Labral tear of shoulder, right, subsequent encounter 12/04/2014   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 Patient has a history of C6-7 arthroplasty and has been treated by this clinician before. PMH includes anxiety, CAD, depression, arthritis of cervical region, gastritis, GERD, cancer, stroke, HLD, HTN, IB, lumbar disc disease (fusion x2 ), migraine, numbness of L hand, headaches, and restless leg syndrome. R sided neck pain with L sided tightness, but no radiating to the arm.  PRECAUTIONS: cervical   SUBJECTIVE: Patient reports she has been sewing a little bit over the weekend, was getting "zaps in L shoulder"   PAIN:  Are you having pain? Yes: NPRS scale: 3/10 Pain location: bilateral cervical  Pain description: aching  Aggravating factors: yardwork, knitting Relieving factors: heat, rest      TODAY'S TREATMENT:   Goals: FOTO: 54%  NDI: 30%  VAS: 3/10 Cervical ROM    Right Left  Flexion 45  Extension 30  Side Bending 25 20  Rotation 44 60    Frequency of pain: few times in the past two weeks     Treatment:      Grade II CPA and UPA thoracic and cervical spine 5x 10 seconds each level J mobilization 3x30 second holds  Suboccipital release 3x30 seconds  Postural education and self use of tennis balls for HEP  suboccipital release   Pt educated throughout session about proper posture and technique with exercises. Improved exercise technique, movement at target joints, use of target muscles after min to mod verbal, visual, tactile cues.    Trigger Point Dry Needling (TDN), unbilled Education performed with patient regarding potential benefit of TDN. Reviewed precautions and risks with patient. Reviewed special precautions/risks over lung fields which include pneumothorax. Reviewed signs and symptoms of pneumothorax and advised pt to go to ER immediately if these symptoms develop advise them of dry needling treatment. Extensive time spent with pt to ensure full understanding of TDN risks. Pt provided verbal consent to treatment. TDN performed to  with 0.3 x .30 single needle placements with local twitch response (LTR) to bilateral upper traps, .  Pistoning technique utilized. Improved pain-free motion following intervention. x10 minutes  PATIENT EDUCATION: Education details: exercise technique, body mechanics Person educated: Patient Education method: Explanation, Demonstration, Tactile cues, and Verbal cues Education comprehension: verbalized understanding, returned demonstration, verbal cues required, and tactile cues required   HOME EXERCISE PROGRAM: Posture education and technique for trip. Scapular retraction.    PT Short Term Goals      PT SHORT TERM GOAL #1   Title Pt will be independent with HEP in order to improve strength and decrease neck pain in order to improve pain-free function at home and work.    Baseline 10/6: will provide next session 11/21; HEP compliant    Time 4    Period Weeks    Status Achieved    Target Date 04/01/21              PT Long Term  Goals       PT LONG TERM GOAL #1   Title Patient will increase FOTO score to equal to or greater than  61/100   to demonstrate statistically significant improvement in mobility and quality of life.    Baseline 10/6: 48% 11/21: 46% 1/16: 50% 1/30: 58% 3/22: 58%; 5/24: 54% 7/17: 52%    Time 4    Period Weeks    Status On-going    Target Date 01/10/2022        PT LONG TERM GOAL #2   Title Pt will demonstrate decrease in NDI by at least 19% in order to demonstrate clinically significant reduction in disability related to neck injury/pain    Baseline 10/6: will perform next session 11/21: 39% 1/16: 40% 1/30: 28% 3/22: 22%; 10/20/21: 38% 7/17: 30%    Time 4   Period Weeks    Status On-going    Target Date 01/10/2022       PT  LONG TERM GOAL #3   Title Pt will decrease worst neck pain as reported on NPRS by at least 2 points (2/10) in order to demonstrate clinically significant reduction in neck pain.    Baseline 10/6: 4/10 11/21: 8/10 muscle spasm 1/16; 5/10 1/30: 3/10 3/22: 4/10; 10/20/21: didsn't ask 7/17: 3/10   Time 8    Period Weeks    Status On-going    Target Date 01/10/2022       PT LONG TERM GOAL #4   Title Patient will demonstrate improved Cervical ROM by 50% in order to improve her functional ability to turn her head or nod for return of functional capabilities including driving, working, and sleeping.    Time 4   Period Weeks    Status On-going    Target Date 01/10/2022       PT LONG TERM GOAL #5   Title Patient will demonstrate decreased frequency of higher pain episodes to improve quality of life and demonstrate improved self maintanance.    Baseline 12/1: painful everyday since recent diagnosis 1/16: slight decrease, able to sleep in bed for past three days 1/30: 2-3 x/week of higher pain 3/22: 3 day sof pain of 4/10 pain 7/17: a few times in the past two weeks    Time 4    Period Weeks    Status On-going    Target Date 01/10/2022                Plan -     Clinical Impression Statement Patient will be recerted for every other week for four weeks until she meets with physician about plan of care. She has had some decline since absent from therapy due to a trip indicating therapy not only reduces her pain but also improves her function, without therapy she has significant decline.  She will benefit from continued skilled PT services to progress her ROM, pain relief and assist in returning patient to her previous level of function in home and at work    Personal Factors and Comorbidities Age;Comorbidity 3+;Past/Current Experience;Profession;Time since onset of injury/illness/exacerbation    Comorbidities anxiety, CAD, depression, arthritis of cervical region, gastritis, GERD, cancer, stroke, HLD, HTN, IB, lumbar disc disease (fusion x2 ), migraine, numbness of L hand, headaches, and restless leg syndrome    Examination-Activity Limitations Bathing;Bed Mobility;Caring for Others;Carry;Dressing;Lift;Reach Overhead;Sleep    Examination-Participation Restrictions Cleaning;Community Activity;Driving;Laundry;Occupation;Yard Work;Meal Prep    Stability/Clinical Decision Making Evolving/Moderate complexity    Rehab Potential Good    PT Frequency 1x / week    PT Duration 4 weeks    PT Treatment/Interventions ADLs/Self Care Home Management;Cryotherapy;Electrical Stimulation;Moist Heat;Functional mobility training;Therapeutic activities;Therapeutic exercise;Patient/family education;Manual techniques;Passive range of motion;Dry needling;Taping;Spinal Manipulations;Joint Manipulations;Traction;Ultrasound;Gait training;Stair training;Energy conservation;Visual/perceptual remediation/compensation    PT Next Visit Plan retrain deep neck flexor funciton, capital flexion ROM and stretching.    Consulted and Agree with Plan of Care Patient               Janna Arch, PT, DPT  12/13/2021, 8:48 AM

## 2021-12-10 ENCOUNTER — Other Ambulatory Visit: Payer: Self-pay

## 2021-12-13 ENCOUNTER — Ambulatory Visit: Payer: 59

## 2021-12-13 DIAGNOSIS — M542 Cervicalgia: Secondary | ICD-10-CM

## 2021-12-13 DIAGNOSIS — R293 Abnormal posture: Secondary | ICD-10-CM

## 2021-12-13 DIAGNOSIS — M6281 Muscle weakness (generalized): Secondary | ICD-10-CM | POA: Diagnosis not present

## 2021-12-19 ENCOUNTER — Other Ambulatory Visit: Payer: Self-pay

## 2021-12-20 ENCOUNTER — Other Ambulatory Visit: Payer: Self-pay

## 2021-12-21 ENCOUNTER — Other Ambulatory Visit: Payer: Self-pay

## 2021-12-22 ENCOUNTER — Other Ambulatory Visit: Payer: Self-pay

## 2021-12-23 ENCOUNTER — Other Ambulatory Visit: Payer: Self-pay

## 2021-12-26 NOTE — Therapy (Signed)
OUTPATIENT PHYSICAL THERAPY TREATMENT NOTE/Physical Therapy Progress Note   Dates of reporting period  08/25/21   to   12/27/21    Patient Name: Lori Morales MRN: [id number] DOB:06/07/1960, 61 y.o., female Today's Date: 12/27/2021  PCP: Adin Hector, MD REFERRING PROVIDER: Adin Hector, MD   PT End of Session - 12/27/21 0715     Visit Number 40    Number of Visits 43    Date for PT Re-Evaluation 01/10/22    Authorization Type [employer]; next session 1/10 PN 12/27/21    Authorization Time Period 10/20/21-12/15/21    Progress Note Due on Visit 40    PT Start Time 0715    PT Stop Time 0759    PT Time Calculation (min) 44 min    Activity Tolerance Patient tolerated treatment well;No increased pain    Behavior During Therapy WFL for tasks assessed/performed                Past Medical History:  Diagnosis Date   Arthritis    CAD (coronary artery disease), native coronary artery    mild non calcified plaque in mid LAD and prox LCx on coronary CTA 01/2019   Depression    GERD (gastroesophageal reflux disease)    Headache    migraines   Hx of squamous cell carcinoma 07/11/2016   Right distal dorsum lat forearm    Hypertension    IBS (irritable bowel syndrome)    Palpitations    PFO (patent foramen ovale)    small by cardiac CTA 01/2019   PONV (postoperative nausea and vomiting)    Restless legs syndrome    Squamous cell carcinoma of skin 12/202/2017   right distal dorsum lateral forearm   Past Surgical History:  Procedure Laterality Date   ABDOMINOPLASTY     BACK SURGERY     1 laminectomy, 1 fusion   COLONOSCOPY     COLONOSCOPY WITH PROPOFOL N/A 01/17/2017   Procedure: COLONOSCOPY WITH PROPOFOL;  Surgeon: Lollie Sails, MD;  Location: [facility] ENDOSCOPY;  Service: Endoscopy;  Laterality: N/A;   WISDOM TOOTH EXTRACTION     Patient Active Problem List   Diagnosis Date Noted   PFO (patent foramen ovale)    CAD (coronary artery disease), native  coronary artery    Acute CVA (cerebrovascular accident) ([place]) 06/15/2018   Depression 10/16/2017   Migraine 10/16/2017   Hyperglycemia, unspecified 07/10/2017   Hyperglycemia 07/10/2017   H/O dizziness 08/02/2016   Cervical disc disorder with radiculopathy of cervical region 04/12/2016   Nonallopathic lesion of thoracic region 04/12/2016   Nonallopathic lesion of rib cage 04/12/2016   Facet arthritis of cervical region 03/21/2016   Hyperlipidemia 01/26/2015   Labral tear of shoulder, right, subsequent encounter 12/04/2014   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 Patient has a history of C6-7 arthroplasty and has been treated by this clinician before. PMH includes anxiety, CAD, depression, arthritis of cervical region, gastritis, GERD, cancer, stroke, HLD, HTN, IB, lumbar disc disease (fusion x2 ),  migraine, numbness of L hand, headaches, and restless leg syndrome. R sided neck pain with L sided tightness, but no radiating to the arm.  PRECAUTIONS: cervical   SUBJECTIVE: Patient reports she had a little pain increase from her trip to [place] for a memorial. Still gets intermittent numbness in her R fingers.   PAIN:  Are you having pain? Yes: NPRS scale: 3/10 Pain location: bilateral cervical  Pain description: aching  Aggravating factors: yardwork, knitting Relieving factors: heat, rest      TODAY'S TREATMENT:     Right Left  Flexion 45  Extension 30   Side Bending 25 20  Rotation 44 60    Frequency of pain: few times in the past two weeks    Treatment:      Grade II CPA and UPA thoracic and cervical spine 5x 10 seconds each level J mobilization 3x30 second holds  Suboccipital release 3x30 seconds Lateral side bent with overpressure at occiput and GH 30 seconds x 3 trials each direction Median nerve glide RUE 10x   Standing bicep lengthening stretch on desk 30 seconds x 2 trials L stretch 30 seconds Trunk extension over foam roller 3x20 second holds   Postural education and self use of tennis balls for HEP  suboccipital release   Pt educated throughout session about proper posture and technique with exercises. Improved exercise technique, movement at target joints, use of target muscles after min to mod verbal, visual, tactile cues.    Trigger Point Dry Needling (TDN), unbilled Education performed with patient regarding potential benefit of TDN. Reviewed precautions and risks with patient. Reviewed special precautions/risks over lung fields which include pneumothorax. Reviewed signs and symptoms of pneumothorax and advised pt to go to ER immediately if these symptoms develop advise them of dry needling treatment. Extensive time spent with pt to ensure full understanding of TDN risks. Pt provided verbal consent to treatment. TDN performed to  with 0.3 x .30 single needle placements with local twitch response (LTR) to bilateral upper traps, .  Pistoning technique utilized. Improved pain-free motion following intervention. x10 minutes  PATIENT EDUCATION: Education details: exercise technique, body mechanics Person educated: Patient Education method: Explanation, Demonstration, Tactile cues, and Verbal cues Education comprehension: verbalized understanding, returned demonstration, verbal cues required, and tactile cues required   HOME EXERCISE PROGRAM: Posture education and technique for trip. Scapular retraction.    PT Short Term  Goals      PT SHORT TERM GOAL #1   Title Pt will be independent with HEP in order to improve strength and decrease neck pain in order to improve pain-free function at home and work.    Baseline 10/6: will provide next session 11/21; HEP compliant    Time 4    Period Weeks    Status Achieved    Target Date 04/01/21              PT Long Term Goals       PT LONG TERM GOAL #1   Title Patient will increase FOTO score to equal to or greater than  61/100   to demonstrate statistically significant improvement in mobility and quality of life.    Baseline 10/6: 48% 11/21: 46% 1/16: 50% 1/30: 58% 3/22: 58%; 5/24: 54% 7/17: 52%    Time 4    Period Weeks    Status On-going    Target Date 01/10/2022        PT LONG TERM GOAL #2   Title Pt will demonstrate decrease in  NDI by at least 19% in order to demonstrate clinically significant reduction in disability related to neck injury/pain    Baseline 10/6: will perform next session 11/21: 39% 1/16: 40% 1/30: 28% 3/22: 22%; 10/20/21: 38% 7/17: 30%    Time 4   Period Weeks    Status On-going    Target Date 01/10/2022       PT LONG TERM GOAL #3   Title Pt will decrease worst neck pain as reported on NPRS by at least 2 points (2/10) in order to demonstrate clinically significant reduction in neck pain.    Baseline 10/6: 4/10 11/21: 8/10 muscle spasm 1/16; 5/10 1/30: 3/10 3/22: 4/10; 10/20/21: didsn't ask 7/17: 3/10   Time 8    Period Weeks    Status On-going    Target Date 01/10/2022       PT LONG TERM GOAL #4   Title Patient will demonstrate improved Cervical ROM by 50% in order to improve her functional ability to turn her head or nod for return of functional capabilities including driving, working, and sleeping.    Time 4   Period Weeks    Status On-going    Target Date 01/10/2022       PT LONG TERM GOAL #5   Title Patient will demonstrate decreased frequency of higher pain episodes to improve quality of life and demonstrate improved  self maintanance.    Baseline 12/1: painful everyday since recent diagnosis 1/16: slight decrease, able to sleep in bed for past three days 1/30: 2-3 x/week of higher pain 3/22: 3 day sof pain of 4/10 pain 7/17: a few times in the past two weeks    Time 4    Period Weeks    Status On-going    Target Date 01/10/2022                Plan -    Clinical Impression Statement Patient's goals performed last session on 12/13/21, please refer to this note for further details. Patient educated on standing bicep lengthening stretch to perform in office. She has significant trigger points of upper traps that are reduced with TDN. Patient to see physician this week about cervical pain.  She will benefit from continued skilled PT services to progress her ROM, pain relief and assist in returning patient to her previous level of function in home and at work    Personal Factors and Comorbidities Age;Comorbidity 3+;Past/Current Experience;Profession;Time since onset of injury/illness/exacerbation    Comorbidities anxiety, CAD, depression, arthritis of cervical region, gastritis, GERD, cancer, stroke, HLD, HTN, IB, lumbar disc disease (fusion x2 ), migraine, numbness of L hand, headaches, and restless leg syndrome    Examination-Activity Limitations Bathing;Bed Mobility;Caring for Others;Carry;Dressing;Lift;Reach Overhead;Sleep    Examination-Participation Restrictions Cleaning;Community Activity;Driving;Laundry;Occupation;Yard Work;Meal Prep    Stability/Clinical Decision Making Evolving/Moderate complexity    Rehab Potential Good    PT Frequency 1x / week    PT Duration 4 weeks    PT Treatment/Interventions ADLs/Self Care Home Management;Cryotherapy;Electrical Stimulation;Moist Heat;Functional mobility training;Therapeutic activities;Therapeutic exercise;Patient/family education;Manual techniques;Passive range of motion;Dry needling;Taping;Spinal Manipulations;Joint Manipulations;Traction;Ultrasound;Gait  training;Stair training;Energy conservation;Visual/perceptual remediation/compensation    PT Next Visit Plan retrain deep neck flexor funciton, capital flexion ROM and stretching.    Consulted and Agree with Plan of Care Patient               Janna Arch, PT, DPT  12/27/2021, 8:30 AM

## 2021-12-27 ENCOUNTER — Ambulatory Visit: Payer: 59

## 2021-12-27 DIAGNOSIS — R293 Abnormal posture: Secondary | ICD-10-CM | POA: Diagnosis not present

## 2021-12-27 DIAGNOSIS — M6281 Muscle weakness (generalized): Secondary | ICD-10-CM | POA: Diagnosis not present

## 2021-12-27 DIAGNOSIS — M542 Cervicalgia: Secondary | ICD-10-CM | POA: Diagnosis not present

## 2021-12-29 DIAGNOSIS — M4802 Spinal stenosis, cervical region: Secondary | ICD-10-CM | POA: Diagnosis not present

## 2021-12-29 DIAGNOSIS — M5412 Radiculopathy, cervical region: Secondary | ICD-10-CM | POA: Diagnosis not present

## 2021-12-30 ENCOUNTER — Other Ambulatory Visit: Payer: Self-pay | Admitting: Orthopedic Surgery

## 2021-12-30 DIAGNOSIS — M4802 Spinal stenosis, cervical region: Secondary | ICD-10-CM

## 2022-01-04 ENCOUNTER — Ambulatory Visit
Admission: RE | Admit: 2022-01-04 | Discharge: 2022-01-04 | Disposition: A | Discharge status: Home / Self Care | Payer: 59 | Source: Ambulatory Visit | Attending: Orthopedic Surgery | Admitting: Orthopedic Surgery

## 2022-01-04 DIAGNOSIS — M4802 Spinal stenosis, cervical region: Secondary | ICD-10-CM | POA: Insufficient documentation

## 2022-01-04 DIAGNOSIS — R202 Paresthesia of skin: Secondary | ICD-10-CM | POA: Diagnosis not present

## 2022-01-04 DIAGNOSIS — M47812 Spondylosis without myelopathy or radiculopathy, cervical region: Secondary | ICD-10-CM | POA: Diagnosis not present

## 2022-01-04 DIAGNOSIS — M542 Cervicalgia: Secondary | ICD-10-CM | POA: Diagnosis not present

## 2022-01-04 DIAGNOSIS — R200 Anesthesia of skin: Secondary | ICD-10-CM | POA: Diagnosis not present

## 2022-01-05 ENCOUNTER — Other Ambulatory Visit: Payer: Self-pay

## 2022-01-06 ENCOUNTER — Other Ambulatory Visit: Payer: Self-pay

## 2022-01-06 MED ORDER — LOSARTAN POTASSIUM 25 MG PO TABS
25.0000 mg | ORAL_TABLET | Freq: Every day | ORAL | 1 refills | Status: DC
  Filled 2022-01-06: qty 90, 90d supply, fill #0

## 2022-01-06 MED ORDER — ESTRADIOL-NORETHINDRONE ACET 0.5-0.1 MG PO TABS
1.0000 | ORAL_TABLET | Freq: Every day | ORAL | 0 refills | Status: DC
  Filled 2022-01-06: qty 56, 56d supply, fill #0

## 2022-01-06 MED ORDER — AMITRIPTYLINE HCL 10 MG PO TABS
ORAL_TABLET | ORAL | 0 refills | Status: DC
  Filled 2022-01-06: qty 60, 60d supply, fill #0

## 2022-01-07 ENCOUNTER — Other Ambulatory Visit: Payer: Self-pay

## 2022-01-07 MED ORDER — PRAMIPEXOLE DIHYDROCHLORIDE 0.25 MG PO TABS
ORAL_TABLET | ORAL | 3 refills | Status: AC
  Filled 2022-01-07: qty 90, 90d supply, fill #0

## 2022-01-07 MED ORDER — LOSARTAN POTASSIUM 25 MG PO TABS
25.0000 mg | ORAL_TABLET | Freq: Every day | ORAL | 3 refills | Status: DC
Start: 2022-01-07 — End: 2022-10-07
  Filled 2022-01-07: qty 90, 90d supply, fill #0

## 2022-01-07 MED ORDER — PANTOPRAZOLE SODIUM 40 MG PO TBEC
DELAYED_RELEASE_TABLET | ORAL | 3 refills | Status: AC
  Filled 2022-01-07: qty 90, 90d supply, fill #0

## 2022-01-07 MED ORDER — PRAVASTATIN SODIUM 40 MG PO TABS
ORAL_TABLET | ORAL | 3 refills | Status: DC
  Filled 2022-01-07: qty 90, 90d supply, fill #0

## 2022-01-10 ENCOUNTER — Ambulatory Visit: Payer: 59 | Attending: Internal Medicine

## 2022-01-10 DIAGNOSIS — M6281 Muscle weakness (generalized): Secondary | ICD-10-CM | POA: Diagnosis not present

## 2022-01-10 DIAGNOSIS — M542 Cervicalgia: Secondary | ICD-10-CM | POA: Diagnosis not present

## 2022-01-10 DIAGNOSIS — R293 Abnormal posture: Secondary | ICD-10-CM | POA: Diagnosis not present

## 2022-01-10 NOTE — Therapy (Signed)
OUTPATIENT PHYSICAL THERAPY TREATMENT NOTE/Discharge    Patient Name: Lori Morales MRN: [id number] DOB:1961-03-15, 61 y.o., female Today's Date: 01/10/2022  PCP: Adin Hector, MD REFERRING PROVIDER: Adin Hector, MD   PT End of Session - 01/10/22 0714     Visit Number 41    Number of Visits 33    Date for PT Re-Evaluation 01/10/22    Authorization Type [employer]; next session 1/10 PN 12/27/21    Authorization Time Period 10/20/21-12/15/21    Progress Note Due on Visit 87    PT Start Time 0715    PT Stop Time 0757    PT Time Calculation (min) 42 min    Activity Tolerance Patient tolerated treatment well;No increased pain    Behavior During Therapy WFL for tasks assessed/performed                 Past Medical History:  Diagnosis Date   Arthritis    CAD (coronary artery disease), native coronary artery    mild non calcified plaque in mid LAD and prox LCx on coronary CTA 01/2019   Depression    GERD (gastroesophageal reflux disease)    Headache    migraines   Hx of squamous cell carcinoma 07/11/2016   Right distal dorsum lat forearm    Hypertension    IBS (irritable bowel syndrome)    Palpitations    PFO (patent foramen ovale)    small by cardiac CTA 01/2019   PONV (postoperative nausea and vomiting)    Restless legs syndrome    Squamous cell carcinoma of skin 12/202/2017   right distal dorsum lateral forearm   Past Surgical History:  Procedure Laterality Date   ABDOMINOPLASTY     BACK SURGERY     1 laminectomy, 1 fusion   COLONOSCOPY     COLONOSCOPY WITH PROPOFOL N/A 01/17/2017   Procedure: COLONOSCOPY WITH PROPOFOL;  Surgeon: Lollie Sails, MD;  Location: [facility] ENDOSCOPY;  Service: Endoscopy;  Laterality: N/A;   WISDOM TOOTH EXTRACTION     Patient Active Problem List   Diagnosis Date Noted   PFO (patent foramen ovale)    CAD (coronary artery disease), native coronary artery    Acute CVA (cerebrovascular accident) ([place]) 06/15/2018    Depression 10/16/2017   Migraine 10/16/2017   Hyperglycemia, unspecified 07/10/2017   Hyperglycemia 07/10/2017   H/O dizziness 08/02/2016   Cervical disc disorder with radiculopathy of cervical region 04/12/2016   Nonallopathic lesion of thoracic region 04/12/2016   Nonallopathic lesion of rib cage 04/12/2016   Facet arthritis of cervical region 03/21/2016   Hyperlipidemia 01/26/2015   Labral tear of shoulder, right, subsequent encounter 12/04/2014   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 Patient has a history of C6-7 arthroplasty and has been treated by this clinician before. PMH includes anxiety, CAD, depression, arthritis of cervical region, gastritis, GERD, cancer, stroke, HLD, HTN, IB, lumbar disc disease (fusion x2 ), migraine, numbness of L hand, headaches, and restless leg syndrome. R sided neck pain with L  sided tightness, but no radiating to the arm.  PRECAUTIONS: cervical   SUBJECTIVE: Patient is aware today is discharge day. Has seen secondary specialist since last session, had a CT scan, awaiting results.  PAIN:  Are you having pain? Yes: NPRS scale: 3/10 Pain location: bilateral cervical  Pain description: aching  Aggravating factors: yardwork, knitting Relieving factors: heat, rest      TODAY'S TREATMENT:    Performance of goals: see below:   Frequency of pain: few times in the past two weeks    Treatment:     Grade II CPA and UPA  thoracic and cervical spine 5x 10 seconds each level J mobilization 3x30 second holds    Standing bicep lengthening stretch on desk 30 seconds x 2 trials L stretch 30 seconds Trunk extension over foam roller 3x20 second holds    Pt educated throughout session about proper posture and technique with exercises. Improved exercise technique, movement at target joints, use of target muscles after min to mod verbal, visual, tactile cues.    Trigger Point Dry Needling (TDN), unbilled Education performed with patient regarding potential benefit of TDN. Reviewed precautions and risks with patient. Reviewed special precautions/risks over lung fields which include pneumothorax. Reviewed signs and symptoms of pneumothorax and advised pt to go to ER immediately if these symptoms develop advise them of dry needling treatment. Extensive time spent with pt to ensure full understanding of TDN risks. Pt provided verbal consent to treatment. TDN performed to  with 0.3 x .30 single needle placements with local twitch response (LTR) to bilateral upper traps, .  Pistoning technique utilized. Improved pain-free motion following intervention. x10 minutes  PATIENT EDUCATION: Education details: exercise technique, body mechanics Person educated: Patient Education method: Explanation, Demonstration, Tactile cues, and Verbal cues Education comprehension: verbalized understanding, returned demonstration, verbal cues required, and tactile cues required   HOME EXERCISE PROGRAM: Posture education and technique for trip. Scapular retraction.    PT Short Term Goals      PT SHORT TERM GOAL #1   Title Pt will be independent with HEP in order to improve strength and decrease neck pain in order to improve pain-free function at home and work.    Baseline 10/6: will provide next session 11/21; HEP compliant    Time 4    Period Weeks    Status Achieved    Target Date 04/01/21              PT Long Term Goals        PT LONG TERM GOAL #1   Title Patient will increase FOTO score to equal to or greater than  61/100   to demonstrate statistically significant improvement in mobility and quality of life.    Baseline 10/6: 48% 11/21: 46% 1/16: 50% 1/30: 58% 3/22: 58%; 5/24: 54% 7/17: 52% 8/14: 54%   Time 4    Period Weeks    Status Not Met   Target Date 01/10/2022        PT LONG TERM GOAL #2   Title Pt will demonstrate decrease in NDI by at least 19% in order to demonstrate clinically significant reduction in disability related to neck injury/pain    Baseline 10/6: will perform next session 11/21: 39% 1/16: 40% 1/30: 28% 3/22: 22%; 10/20/21: 38% 7/17: 30% 8/14: 30%   Time 4   Period Weeks    Status Not Met   Target Date 01/10/2022       PT LONG TERM GOAL #3   Title  Pt will decrease worst neck pain as reported on NPRS by at least 2 points (2/10) in order to demonstrate clinically significant reduction in neck pain.    Baseline 10/6: 4/10 11/21: 8/10 muscle spasm 1/16; 5/10 1/30: 3/10 3/22: 4/10; 10/20/21: didsn't ask 7/17: 3/10 8/14: 4/10   Time 8    Period Weeks    Status Not Met    Target Date 01/10/2022       PT LONG TERM GOAL #4   Title Patient will demonstrate improved Cervical ROM by 50% in order to improve her functional ability to turn her head or nod for return of functional capabilities including driving, working, and sleeping.    Time 4   Period Weeks    Status Not Met   Target Date 01/10/2022       PT LONG TERM GOAL #5   Title Patient will demonstrate decreased frequency of higher pain episodes to improve quality of life and demonstrate improved self maintanance.    Baseline 12/1: painful everyday since recent diagnosis 1/16: slight decrease, able to sleep in bed for past three days 1/30: 2-3 x/week of higher pain 3/22: 3 day sof pain of 4/10 pain 7/17: a few times in the past two weeks    Time 4    Period Weeks    Status Not Met   Target Date 01/10/2022                Plan -     Clinical Impression Statement Patient has reached pleateu and will be discharged. Patient agreeable to plan. I will be happy to see patient again in the future.    Personal Factors and Comorbidities Age;Comorbidity 3+;Past/Current Experience;Profession;Time since onset of injury/illness/exacerbation    Comorbidities anxiety, CAD, depression, arthritis of cervical region, gastritis, GERD, cancer, stroke, HLD, HTN, IB, lumbar disc disease (fusion x2 ), migraine, numbness of L hand, headaches, and restless leg syndrome    Examination-Activity Limitations Bathing;Bed Mobility;Caring for Others;Carry;Dressing;Lift;Reach Overhead;Sleep    Examination-Participation Restrictions Cleaning;Community Activity;Driving;Laundry;Occupation;Yard Work;Meal Prep    Stability/Clinical Decision Making Evolving/Moderate complexity    Rehab Potential Good    PT Frequency 1x / week    PT Duration 4 weeks    PT Treatment/Interventions ADLs/Self Care Home Management;Cryotherapy;Electrical Stimulation;Moist Heat;Functional mobility training;Therapeutic activities;Therapeutic exercise;Patient/family education;Manual techniques;Passive range of motion;Dry needling;Taping;Spinal Manipulations;Joint Manipulations;Traction;Ultrasound;Gait training;Stair training;Energy conservation;Visual/perceptual remediation/compensation    PT Next Visit Plan retrain deep neck flexor funciton, capital flexion ROM and stretching.    Consulted and Agree with Plan of Care Patient               Janna Arch, PT, DPT  01/10/2022, 7:57 AM

## 2022-01-23 ENCOUNTER — Other Ambulatory Visit: Payer: Self-pay

## 2022-01-24 ENCOUNTER — Ambulatory Visit: Payer: 59

## 2022-01-24 ENCOUNTER — Other Ambulatory Visit: Payer: Self-pay

## 2022-02-04 DIAGNOSIS — M2578 Osteophyte, vertebrae: Secondary | ICD-10-CM | POA: Diagnosis not present

## 2022-02-04 DIAGNOSIS — M4802 Spinal stenosis, cervical region: Secondary | ICD-10-CM | POA: Diagnosis not present

## 2022-02-04 DIAGNOSIS — M4312 Spondylolisthesis, cervical region: Secondary | ICD-10-CM | POA: Diagnosis not present

## 2022-02-04 DIAGNOSIS — M5412 Radiculopathy, cervical region: Secondary | ICD-10-CM | POA: Diagnosis not present

## 2022-02-04 DIAGNOSIS — M4722 Other spondylosis with radiculopathy, cervical region: Secondary | ICD-10-CM | POA: Diagnosis not present

## 2022-02-15 ENCOUNTER — Other Ambulatory Visit: Payer: Self-pay

## 2022-02-15 DIAGNOSIS — Z01419 Encounter for gynecological examination (general) (routine) without abnormal findings: Secondary | ICD-10-CM | POA: Diagnosis not present

## 2022-02-15 DIAGNOSIS — Z124 Encounter for screening for malignant neoplasm of cervix: Secondary | ICD-10-CM | POA: Diagnosis not present

## 2022-02-15 DIAGNOSIS — Z13820 Encounter for screening for osteoporosis: Secondary | ICD-10-CM | POA: Diagnosis not present

## 2022-02-15 DIAGNOSIS — Z1231 Encounter for screening mammogram for malignant neoplasm of breast: Secondary | ICD-10-CM | POA: Diagnosis not present

## 2022-02-15 MED ORDER — ESTRADIOL 10 MCG VA TABS
ORAL_TABLET | VAGINAL | 4 refills | Status: AC
  Filled 2022-02-15: qty 24, 84d supply, fill #0
  Filled 2022-08-11: qty 24, 84d supply, fill #1

## 2022-02-20 ENCOUNTER — Other Ambulatory Visit: Payer: Self-pay

## 2022-02-21 ENCOUNTER — Other Ambulatory Visit: Payer: Self-pay

## 2022-02-22 ENCOUNTER — Other Ambulatory Visit: Payer: Self-pay

## 2022-02-22 MED ORDER — PANTOPRAZOLE SODIUM 40 MG PO TBEC
DELAYED_RELEASE_TABLET | ORAL | 3 refills | Status: DC
  Filled 2022-02-22: qty 90, 90d supply, fill #0

## 2022-02-23 DIAGNOSIS — M519 Unspecified thoracic, thoracolumbar and lumbosacral intervertebral disc disorder: Secondary | ICD-10-CM | POA: Diagnosis not present

## 2022-02-23 DIAGNOSIS — R739 Hyperglycemia, unspecified: Secondary | ICD-10-CM | POA: Diagnosis not present

## 2022-02-23 DIAGNOSIS — E7849 Other hyperlipidemia: Secondary | ICD-10-CM | POA: Diagnosis not present

## 2022-02-23 DIAGNOSIS — I2510 Atherosclerotic heart disease of native coronary artery without angina pectoris: Secondary | ICD-10-CM | POA: Diagnosis not present

## 2022-03-01 DIAGNOSIS — F3289 Other specified depressive episodes: Secondary | ICD-10-CM | POA: Diagnosis not present

## 2022-03-01 DIAGNOSIS — I2510 Atherosclerotic heart disease of native coronary artery without angina pectoris: Secondary | ICD-10-CM | POA: Diagnosis not present

## 2022-03-01 DIAGNOSIS — I10 Essential (primary) hypertension: Secondary | ICD-10-CM | POA: Diagnosis not present

## 2022-03-01 DIAGNOSIS — R7303 Prediabetes: Secondary | ICD-10-CM | POA: Diagnosis not present

## 2022-03-01 DIAGNOSIS — M519 Unspecified thoracic, thoracolumbar and lumbosacral intervertebral disc disorder: Secondary | ICD-10-CM | POA: Diagnosis not present

## 2022-03-01 DIAGNOSIS — Z8673 Personal history of transient ischemic attack (TIA), and cerebral infarction without residual deficits: Secondary | ICD-10-CM | POA: Diagnosis not present

## 2022-03-01 DIAGNOSIS — E7849 Other hyperlipidemia: Secondary | ICD-10-CM | POA: Diagnosis not present

## 2022-03-06 ENCOUNTER — Other Ambulatory Visit: Payer: Self-pay

## 2022-03-07 ENCOUNTER — Other Ambulatory Visit: Payer: Self-pay

## 2022-03-08 ENCOUNTER — Other Ambulatory Visit: Payer: Self-pay

## 2022-03-08 MED ORDER — LOSARTAN POTASSIUM 25 MG PO TABS
25.0000 mg | ORAL_TABLET | Freq: Every day | ORAL | 3 refills | Status: AC
  Filled 2022-03-08: qty 90, 90d supply, fill #0

## 2022-03-09 ENCOUNTER — Other Ambulatory Visit: Payer: Self-pay

## 2022-03-09 MED ORDER — AMITRIPTYLINE HCL 10 MG PO TABS
10.0000 mg | ORAL_TABLET | Freq: Every day | ORAL | 3 refills | Status: DC
  Filled 2022-03-09: qty 10, 10d supply, fill #0
  Filled 2022-03-09: qty 80, 80d supply, fill #0
  Filled 2022-06-22: qty 90, 90d supply, fill #1
  Filled 2022-09-18: qty 90, 90d supply, fill #2
  Filled 2022-12-15: qty 90, 90d supply, fill #3

## 2022-03-11 DIAGNOSIS — N949 Unspecified condition associated with female genital organs and menstrual cycle: Secondary | ICD-10-CM | POA: Diagnosis not present

## 2022-03-11 DIAGNOSIS — N8110 Cystocele, unspecified: Secondary | ICD-10-CM | POA: Diagnosis not present

## 2022-03-14 ENCOUNTER — Other Ambulatory Visit: Payer: Self-pay | Admitting: Obstetrics and Gynecology

## 2022-03-14 DIAGNOSIS — Z1231 Encounter for screening mammogram for malignant neoplasm of breast: Secondary | ICD-10-CM

## 2022-03-17 ENCOUNTER — Ambulatory Visit: Payer: 59 | Attending: Registered Nurse | Admitting: Physical Therapy

## 2022-03-17 ENCOUNTER — Encounter: Payer: Self-pay | Admitting: Physical Therapy

## 2022-03-17 DIAGNOSIS — M6281 Muscle weakness (generalized): Secondary | ICD-10-CM | POA: Insufficient documentation

## 2022-03-17 DIAGNOSIS — R2689 Other abnormalities of gait and mobility: Secondary | ICD-10-CM | POA: Diagnosis not present

## 2022-03-17 DIAGNOSIS — R278 Other lack of coordination: Secondary | ICD-10-CM | POA: Insufficient documentation

## 2022-03-17 DIAGNOSIS — M533 Sacrococcygeal disorders, not elsewhere classified: Secondary | ICD-10-CM | POA: Insufficient documentation

## 2022-03-17 NOTE — Therapy (Signed)
OUTPATIENT PHYSICAL THERAPY EVALUATION   Patient Name: Lori Morales MRN: [id number] DOB:January 07, 1961, 61 y.o., female Today's Date: 03/17/2022   PT End of Session - 03/17/22 1507     Visit Number 1    Number of Visits 89    Date for PT Re-Evaluation 01/10/22    Authorization Type --    Authorization Time Period --    Progress Note Due on Visit --    PT Start Time 1503    PT Stop Time 1545    PT Time Calculation (min) 42 min    Activity Tolerance Patient tolerated treatment well;No increased pain    Behavior During Therapy WFL for tasks assessed/performed             Past Medical History:  Diagnosis Date   Arthritis    CAD (coronary artery disease), native coronary artery    mild non calcified plaque in mid LAD and prox LCx on coronary CTA 01/2019   Depression    GERD (gastroesophageal reflux disease)    Headache    migraines   Hx of squamous cell carcinoma 07/11/2016   Right distal dorsum lat forearm    Hypertension    IBS (irritable bowel syndrome)    Palpitations    PFO (patent foramen ovale)    small by cardiac CTA 01/2019   PONV (postoperative nausea and vomiting)    Restless legs syndrome    Squamous cell carcinoma of skin 12/202/2017   right distal dorsum lateral forearm   Past Surgical History:  Procedure Laterality Date   ABDOMINOPLASTY     BACK SURGERY     1 laminectomy, 1 fusion   COLONOSCOPY     COLONOSCOPY WITH PROPOFOL N/A 01/17/2017   Procedure: COLONOSCOPY WITH PROPOFOL;  Surgeon: Lollie Sails, MD;  Location: [facility] ENDOSCOPY;  Service: Endoscopy;  Laterality: N/A;   WISDOM TOOTH EXTRACTION     Patient Active Problem List   Diagnosis Date Noted   PFO (patent foramen ovale)    CAD (coronary artery disease), native coronary artery    Acute CVA (cerebrovascular accident) ([place]) 06/15/2018   Depression 10/16/2017   Migraine 10/16/2017   Hyperglycemia, unspecified 07/10/2017   Hyperglycemia 07/10/2017   H/O dizziness 08/02/2016   Cervical  disc disorder with radiculopathy of cervical region 04/12/2016   Nonallopathic lesion of thoracic region 04/12/2016   Nonallopathic lesion of rib cage 04/12/2016   Facet arthritis of cervical region 03/21/2016   Hyperlipidemia 01/26/2015   Labral tear of shoulder, right, subsequent encounter 12/04/2014   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   Impingement syndrome of right shoulder 05/28/2014   Right supraspinatus tenosynovitis 05/28/2014   Arthritis pain 04/03/2014   Fatigue 04/03/2014   Cervical intraepithelial neoplasia grade 1 06/20/2012   IBS (irritable bowel syndrome) 06/20/2012   Lumbar disc disease 06/20/2012    PCP: Caryl Comes  REFERRING PROVIDER: Buscher , NP  REFERRING DIAG: N81.10 (ICD-10-CM) - Cystocele, unspecified   Rationale for Evaluation and Treatment Rehabilitation  THERAPY DIAG:  Sacrococcygeal disorders, not elsewhere classified  Other lack of coordination  Other abnormalities of gait and mobility  ONSET DATE:  03/09/22   SUBJECTIVE:  SUBJECTIVE STATEMENT: 1) Prolapse:   Pt noticed her prolapse when taking a bath. Her gynecologist confirmed a cystocele. Pt has noticed the pressure , mild cramping in low ab area, fullness, and more frequent trips to the bathroom. Pt tries to not push.  Urinary frequency from 4 x / 2hr.    Difficulty with completing urination/ emptying.  Denied SUI, UI. Daily bowel movement with Colace, Type 4 consistency  2) neck pain: with L shoulder and both scapula which worsens as the day goes on . Pt also experiences R tingling sensation along the neck when turning R. Since her epidural, the tingling has improved in her fingers.  See cervical surgeries in Pertinent Hx.  Fitness routine: walking 30 min but in the last 2-3 months , pt has not been as active with neck pain.     Pt used to weight lift, crunches.      PERTINENT HISTORY:   partial abdominoplasty umbilical hernia  back surgery laminectomyL5 and  fusion of L4-S1  neck surgery C6-7 arthoplasty  Arthritis C2-C6  pending neck surgery 06/14/22 for C6-7    PAIN:  Are you having pain? No  PRECAUTIONS: None  WEIGHT BEARING RESTRICTIONS: No  FALLS:  Has patient fallen in last 6 months? 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%   PLOF: Independent  PATIENT GOALS:   Keep the prolapse from worsening    OBJECTIVE:     [facility] PT Assessment - 03/17/22 1538       Observation/Other Assessments   Scoliosis R  iliac crest / L shoulder higher      Strength   Overall Strength Comments R LE:  3+/5, L LE 5/5      Palpation   SI assessment  supine: L low rib, L ASIS/ medial malleoli  higher      Ambulation/Gait   Gait Comments excessive R pelvic sway (R hip higher), minmial trunk rotation/ shoulder rotation               [facility] Adult PT Treatment/Exercise - 03/17/22 1538       Therapeutic Activites    Therapeutic Activities Other Therapeutic Activities    Other Therapeutic Activities see pt education section      Neuro Re-ed    Neuro Re-ed Details  cued for body mechanics to minimzie straining pelvic floor, worsening prolapse                 HOME EXERCISE PROGRAM: See pt instruction section    ASSESSMENT:  CLINICAL IMPRESSION:  Pt is a 61   yo  who presents with prolapse Sx and neck / shoulder pain   which impact QOL, ADL, fitness, and community activities.   Pt's musculoskeletal assessment revealed uneven pelvic girdle and shoulder height, asymmetries to gait pattern, limited spinal /pelvic mobility, dyscoordination and strength of pelvic floor mm, R hip weakness, poor body mechanics which places strain on the abdominal/pelvic floor mm. These  are deficits that indicate an ineffective intraabdominal pressure system associated with increased risk for pt's Sx.    Pt performs many gravity-loaded tasks at work and home. Pt will benefit from proper coordination training and education on fitness and functional positions in order to yield greater outcomes as pt performed sit-ups/ crunches in the past.  Advised pt to not perform sit-ups and crunches as these movement patterns lead to more downward  forces on the pelvic floor, negatively impacting abdominopelvic/spinal dysfunctions.    Pt was provided education on etiology of Sx with anatomy, physiology explanation with images along with the benefits of customized pelvic PT Tx based on pt's medical conditions and musculoskeletal deficits.  Explained the physiology of deep core mm coordination and roles of pelvic floor function in urination, defecation, sexual function, and postural control with deep core mm system.    Regional interdependent approaches will yield greater benefits in pt's POC.  Following Tx, pt demo'd proper body mechanics to minimize worsening of prolapse and neck pain. Plan to assess and Tx scoliosis at next session. Pt benefits from skilled PT.    OBJECTIVE IMPAIRMENTS decreased activity tolerance, decreased coordination, decreased endurance, decreased mobility, difficulty walking, decreased ROM, decreased strength, decreased safety awareness, hypomobility, increased muscle spasms, impaired flexibility, improper body mechanics, postural dysfunction, and pain. scar restrictions   ACTIVITY LIMITATIONS  self-care, sleep, home chores, work tasks    PARTICIPATION LIMITATIONS:  community, gym activities    [place]   scoliosis, past surgeries , standing at work   are also affecting patient's functional outcome.    REHAB POTENTIAL: Good   CLINICAL DECISION MAKING: Evolving/moderate complexity   EVALUATION COMPLEXITY: Moderate    PATIENT EDUCATION:    Education  details: Showed pt anatomy images. Explained muscles attachments/ connection, physiology of deep core system/ spinal- thoracic-pelvis-lower kinetic chain as they relate to pt's presentation, Sx, and past Hx. Explained what and how these areas of deficits need to be restored to balance and function    See Therapeutic activity / neuromuscular re-education section  Answered pt's questions.   Person educated: Patient Education method: Explanation, Demonstration, Tactile cues, Verbal cues, and Handouts Education comprehension: verbalized understanding, returned demonstration, verbal cues required, tactile cues required, and needs further education     PLAN: PT FREQUENCY: 1x/week   PT DURATION: 10 weeks   PLANNED INTERVENTIONS: Therapeutic exercises, Therapeutic activity, Neuromuscular re-education, Balance training, Gait training, Patient/Family education, Self Care, Joint mobilization, Spinal mobilization, Moist heat, Taping, and Manual therapy.   PLAN FOR NEXT SESSION: See clinical impression for plan     GOALS: Goals reviewed with patient? Yes  SHORT TERM GOALS: Target date: 05/26/2022    Pt will demo IND with HEP                    Baseline: Not IND            Goal status: INITIAL   LONG TERM GOALS: Target date: 05/26/2022    1.Pt will demo proper deep core coordination without chest breathing and optimal excursion of diaphragm/pelvic floor in order to promote spinal stability and pelvic floor function  Baseline: dyscoordination Goal status: INITIAL  2.  Pt will demo > 5 pt change on FOTO  to improve QOL and function   Pelvic Pain baseline -13 pts  PFDI Urinary baseline -4 pts   Urinary Problem baseline-63 pts    Goal status: INITIAL  3.  Pt will demo proper body mechanics in against gravity tasks and ADLs  work tasks, fitness  to minimize straining pelvic floor / back                  Baseline: not IND, improper form that places strain on pelvic floor                 Goal status: INITIAL    4. Pt will demo levelled pelvic girdle and shoulder height in  order to progress to deep core strengthening HEP and restore mobility at spine, pelvis, gait, posture   Baseline: R  iliac crest / L shoulder higher  Goal status: INITIAL    5. Pt will demo report complete urination across 75% of the time in order to minimize risk for UTIs  Baseline: Difficulty with completing urination.  Goal status: INITIAL   6.  Pt will report decreased urinary frequency to < 2 x / 2 hours in order to participate in community activities. Baseline:  Urinary frequency from 4 x / 2hr.  Goal status: INITIAL    Jerl Mina, PT 03/17/2022, 3:27 PM

## 2022-03-17 NOTE — Patient Instructions (Signed)
Avoid straining pelvic floor, abdominal muscles , spine  Use log rolling technique instead of getting out of bed with your neck or the sit-up     Log rolling into and out of bed   Log rolling into and out of bed If getting out of bed on R side, Bent knees, scoot hips/ shoulder to L  Raise R arm completely overhead, rolling onto armpit  Then lower bent knees to bed to get into complete side lying position  Then drop legs off bed, and push up onto R elbow/forearm, and use L hand to push onto the bed  __   Proper body mechanics with getting out of a chair to decrease strain  on back &pelvic floor   Avoid holding your breath when Getting out of the chair:  Scoot to front part of chair chair Heels behind knees, feet are hip width apart, nose over toes  Inhale like you are smelling roses Exhale to stand   __  Sitting with feet on ground, four points of contact Catch yourself crossing ankles and thighs  __  

## 2022-03-22 ENCOUNTER — Other Ambulatory Visit: Payer: Self-pay

## 2022-03-22 MED ORDER — HYDROCODONE-ACETAMINOPHEN 5-325 MG PO TABS
ORAL_TABLET | ORAL | 0 refills | Status: DC
  Filled 2022-03-22: qty 20, 5d supply, fill #0

## 2022-03-23 ENCOUNTER — Ambulatory Visit: Payer: 59 | Admitting: Physical Therapy

## 2022-03-23 DIAGNOSIS — R278 Other lack of coordination: Secondary | ICD-10-CM | POA: Diagnosis not present

## 2022-03-23 DIAGNOSIS — R2689 Other abnormalities of gait and mobility: Secondary | ICD-10-CM | POA: Diagnosis not present

## 2022-03-23 DIAGNOSIS — M533 Sacrococcygeal disorders, not elsewhere classified: Secondary | ICD-10-CM

## 2022-03-23 DIAGNOSIS — M6281 Muscle weakness (generalized): Secondary | ICD-10-CM | POA: Diagnosis not present

## 2022-03-23 NOTE — Patient Instructions (Signed)
  Lengthen Back rib by L  shoulder ( elbow circles, from in to out , shoudler blades slides back and down) with L hand on shoulder     Breathing 10 reps  Brushing arm with 3/4 turn onto pillow behind back  Lying on R  side ,Pillow/ Block between knees     dragging top forearm across ribs below breast rotating 3/4 turn,  rotating  _L_ only this week ,  relax onto the pillow behind the back  and then back to other palm , maintain top palm on body whole top and not lift shoulder  ___

## 2022-03-23 NOTE — Therapy (Signed)
OUTPATIENT PHYSICAL THERAPY TREATMENT    Patient Name: Lori Morales MRN: [id number] DOB:[birth year], 61 y.o., female Today's Date: 03/23/2022.pten    PT End of Session - 03/23/22 0807     Visit Number 2    Number of Visits 10    Date for PT Re-Evaluation 05/24/22    PT Start Time 0803    PT Stop Time 0845    PT Time Calculation (min) 42 min    Activity Tolerance Patient tolerated treatment well;No increased pain    Behavior During Therapy WFL for tasks assessed/performed               Past Medical History:  Diagnosis Date   Arthritis    CAD (coronary artery disease), native coronary artery    mild non calcified plaque in mid LAD and prox LCx on coronary CTA 01/2019   Depression    GERD (gastroesophageal reflux disease)    Headache    migraines   Hx of squamous cell carcinoma 07/11/2016   Right distal dorsum lat forearm    Hypertension    IBS (irritable bowel syndrome)    Palpitations    PFO (patent foramen ovale)    small by cardiac CTA 01/2019   PONV (postoperative nausea and vomiting)    Restless legs syndrome    Squamous cell carcinoma of skin 12/202/2017   right distal dorsum lateral forearm   Past Surgical History:  Procedure Laterality Date   ABDOMINOPLASTY     BACK SURGERY     1 laminectomy, 1 fusion   COLONOSCOPY     COLONOSCOPY WITH PROPOFOL N/A 01/17/2017   Procedure: COLONOSCOPY WITH PROPOFOL;  Surgeon: Lollie Sails, MD;  Location: [facility] ENDOSCOPY;  Service: Endoscopy;  Laterality: N/A;   WISDOM TOOTH EXTRACTION     Patient Active Problem List   Diagnosis Date Noted   PFO (patent foramen ovale)    CAD (coronary artery disease), native coronary artery    Acute CVA (cerebrovascular accident) ([place]) 06/15/2018   Depression 10/16/2017   Migraine 10/16/2017   Hyperglycemia, unspecified 07/10/2017   Hyperglycemia 07/10/2017   H/O dizziness 08/02/2016   Cervical disc disorder with radiculopathy of cervical region 04/12/2016   Nonallopathic  lesion of thoracic region 04/12/2016   Nonallopathic lesion of rib cage 04/12/2016   Facet arthritis of cervical region 03/21/2016   Hyperlipidemia 01/26/2015   Labral tear of shoulder, right, subsequent encounter 12/04/2014   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  ONSET DATE:  03/09/22   SUBJECTIVE:               SUBJECTIVE STATEMENT on today 03/23/22  Pt reports radiating  pain down the L shoulder and to wrist have been new this past week. The R radiating pain to her first three fingers persists. Pt had to leave work early to due to pain yesterday.  SUBJECTIVE STATEMENT on EVAL 03/17/22 : 1) Prolapse:   Pt noticed her prolapse when taking a bath. Her gynecologist confirmed a cystocele. Pt has noticed the pressure , mild cramping in low ab area, fullness, and more frequent trips to the bathroom. Pt tries to not push.  Urinary frequency from 4 x / 2hr.    Difficulty with completing urination/ emptying.  Denied SUI, UI. Daily bowel movement with Colace, Type 4 consistency  2) neck pain: with L shoulder and both scapula which worsens as the day goes on . Pt also experiences R tingling sensation along the neck when turning R. Since her epidural, the tingling has improved in her fingers.  See cervical surgeries in Pertinent Hx.  Fitness routine: walking 30  min but in the last 2-3 months , pt has not been as active with neck pain.    Pt used to weight lift, crunches.      PERTINENT HISTORY:   partial abdominoplasty umbilical hernia  back surgery laminectomyL5 and  fusion of L4-S1  neck surgery C6-7 arthoplasty  Arthritis C2-C6  pending neck surgery 06/14/22 for C6-7    PAIN:  Are you having pain? No  PRECAUTIONS: None  WEIGHT BEARING RESTRICTIONS: No  FALLS:  Has patient fallen in last 6 months? 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%   PLOF: Independent  PATIENT GOALS:   Keep the prolapse from worsening    OBJECTIVE:   [facility] PT Assessment - 03/23/22 0811       Sensation   Additional Comments decreased sensation along median nn on L ( pre Tx from upper trap to first 3 digits) , only decreased sensation only at upper trap , centralized post Tx)      Coordination   Coordination and Movement Description limited anterior lateral excursion at rib 6-7 L , chest breathing      Palpation   Spinal mobility T3-5, T9-10 hypomobile tenderness , tightness interspinal/ intercostals/ paraspinal    SI assessment  87 cm B ASIS to medial malleoli,  standing: L iliac crest / L shoulder higher, ( post Tx: levelled pelvic girdle)             [facility] Adult PT Treatment/Exercise - 03/23/22 1012       Therapeutic Activites    Other Therapeutic Activities modification to scapular mobility exerise to minimize shoulder pain      Neuro Re-ed    Neuro Re-ed Details  cued for segmental rotation, less chest breathing,      Manual Therapy   Manual therapy comments STM/MWM at problem areas noted in assessment to promote more posteror rotation of thorax. mobility of scapular, less forward head posture , optimial diaphramgatic excursion for deep core system ( IAP system to minimize worsening of prolapse)                  HOME EXERCISE PROGRAM: See pt instruction section    ASSESSMENT:  CLINICAL IMPRESSION:    Today, assessed and provided Tx for scoliosis to promote more posteror rotation of thorax, mobility of scapular, less forward head posture , and optimize diaphramgatic excursion for deep core system ( IAP system to minimize worsening of prolapse.   Post Tx, pt showed improved T3-5, T9-10 mobility  and decreased interspinal/ paraspinal, intercostal mm mobility. Pt also showed centralization of median nn pain on LUE. Continue to apply  manual Tx next session to this area to realign spine at thorax and C/T junction to improve segmental mobility and optimize diaphragm.  Plan to add cervical/ scapular strengthening and then progress to deep core  system coordination. Plan to test cervical endurance next session.      Pt benefits from skilled PT.    OBJECTIVE IMPAIRMENTS decreased activity tolerance, decreased coordination, decreased endurance, decreased mobility, difficulty walking, decreased ROM, decreased strength, decreased safety awareness, hypomobility, increased muscle spasms, impaired flexibility, improper body mechanics, postural dysfunction, and pain. scar restrictions   ACTIVITY LIMITATIONS  self-care, sleep, home chores, work tasks    PARTICIPATION LIMITATIONS:  community, gym activities    [place]   scoliosis, past surgeries , standing at work   are also affecting patient's functional outcome.    REHAB POTENTIAL: Good   CLINICAL DECISION MAKING: Evolving/moderate complexity   EVALUATION COMPLEXITY: Moderate    PATIENT EDUCATION:    Education details: Showed pt anatomy images. Explained muscles attachments/ connection, physiology of deep core system/ spinal- thoracic-pelvis-lower kinetic chain as they relate to pt's presentation, Sx, and past Hx. Explained what and how these areas of deficits need to be restored to balance and function    See Therapeutic  activity / neuromuscular re-education section  Answered pt's questions.   Person educated: Patient Education method: Explanation, Demonstration, Tactile cues, Verbal cues, and Handouts Education comprehension: verbalized understanding, returned demonstration, verbal cues required, tactile cues required, and needs further education     PLAN: PT FREQUENCY: 1x/week   PT DURATION: 10 weeks   PLANNED INTERVENTIONS: Therapeutic exercises, Therapeutic activity, Neuromuscular re-education, Balance training, Gait training, Patient/Family education, Self Care, Joint mobilization, Spinal mobilization, Moist heat, Taping, and Manual therapy.   PLAN FOR NEXT SESSION: See clinical impression for plan     GOALS: Goals reviewed with patient? Yes  SHORT TERM GOALS: Target date: 05/26/2022    Pt will demo IND with HEP                    Baseline: Not IND            Goal status: INITIAL   LONG TERM GOALS: Target date: 05/26/2022    1.Pt will demo proper deep core coordination without chest breathing and optimal excursion of diaphragm/pelvic floor in order to promote spinal stability and pelvic floor function  Baseline: dyscoordination Goal status: INITIAL  2.  Pt will demo > 5 pt change on FOTO  to improve QOL and function   Pelvic Pain baseline -13 pts  PFDI Urinary baseline -4 pts   Urinary Problem baseline-63 pts    Goal status: INITIAL  3.  Pt will demo proper body mechanics in against gravity tasks and ADLs  work tasks, fitness  to minimize straining pelvic floor / back                  Baseline: not IND, improper form that places strain on pelvic floor                Goal status: INITIAL    4. Pt will demo levelled pelvic girdle and shoulder height in order to progress to deep core strengthening HEP and restore mobility at spine, pelvis, gait, posture   Baseline: R  iliac crest / L shoulder higher  Goal status: INITIAL    5. Pt will demo report complete urination  across 75% of the time in order to minimize risk for UTIs  Baseline: Difficulty with completing urination.  Goal status: INITIAL   6.  Pt will report decreased urinary frequency to < 2 x / 2 hours in order to participate in community activities. Baseline:  Urinary frequency from 4 x / 2hr.  Goal status: INITIAL    Jerl Mina, PT 03/23/2022, 8:13 AM

## 2022-03-24 ENCOUNTER — Other Ambulatory Visit: Payer: Self-pay

## 2022-03-24 MED ORDER — ZOLPIDEM TARTRATE ER 6.25 MG PO TBCR
EXTENDED_RELEASE_TABLET | ORAL | 5 refills | Status: DC
  Filled 2022-03-24: qty 30, 30d supply, fill #0
  Filled 2022-08-11: qty 30, 30d supply, fill #1

## 2022-03-29 DIAGNOSIS — H5213 Myopia, bilateral: Secondary | ICD-10-CM | POA: Diagnosis not present

## 2022-04-07 ENCOUNTER — Other Ambulatory Visit: Payer: Self-pay

## 2022-04-07 ENCOUNTER — Ambulatory Visit: Payer: 59 | Attending: Registered Nurse | Admitting: Physical Therapy

## 2022-04-07 DIAGNOSIS — M533 Sacrococcygeal disorders, not elsewhere classified: Secondary | ICD-10-CM | POA: Diagnosis not present

## 2022-04-07 DIAGNOSIS — R278 Other lack of coordination: Secondary | ICD-10-CM | POA: Diagnosis not present

## 2022-04-07 DIAGNOSIS — M25522 Pain in left elbow: Secondary | ICD-10-CM | POA: Diagnosis not present

## 2022-04-07 DIAGNOSIS — M5412 Radiculopathy, cervical region: Secondary | ICD-10-CM | POA: Diagnosis not present

## 2022-04-07 DIAGNOSIS — M542 Cervicalgia: Secondary | ICD-10-CM | POA: Diagnosis not present

## 2022-04-07 DIAGNOSIS — R293 Abnormal posture: Secondary | ICD-10-CM | POA: Diagnosis not present

## 2022-04-07 DIAGNOSIS — M6281 Muscle weakness (generalized): Secondary | ICD-10-CM | POA: Diagnosis not present

## 2022-04-07 DIAGNOSIS — M5440 Lumbago with sciatica, unspecified side: Secondary | ICD-10-CM | POA: Insufficient documentation

## 2022-04-07 DIAGNOSIS — R2689 Other abnormalities of gait and mobility: Secondary | ICD-10-CM | POA: Diagnosis not present

## 2022-04-07 NOTE — Patient Instructions (Signed)
Stretches :   [employer], dragging wrist and forearms 10 reps    ___    Side of hip stretch:  both sides   Reclined twist for hips and side of the hips/ legs  Lay on your back, knees bend Scoot hips to the R , leave shoulders in place Wobble knees to the L side 45 deg and to midline  10 reps    For 5 min  resting onto pillows to keep leg at the same width of hips Pillow under L thigh to minimize too much strain '  __  Deep core level 1 ( handout)

## 2022-04-07 NOTE — Therapy (Signed)
OUTPATIENT PHYSICAL THERAPY TREATMENT    Patient Name: Lori Morales MRN: [id number] DOB:[birth year], 61 y.o., female Today's Date: 04/07/2022.pten    PT End of Session - 04/07/22 0811     Visit Number 3    Number of Visits 10    Date for PT Re-Evaluation 05/24/22    PT Start Time 0809    PT Stop Time 0848    PT Time Calculation (min) 39 min    Activity Tolerance Patient tolerated treatment well;No increased pain    Behavior During Therapy WFL for tasks assessed/performed               Past Medical History:  Diagnosis Date   Arthritis    CAD (coronary artery disease), native coronary artery    mild non calcified plaque in mid LAD and prox LCx on coronary CTA 01/2019   Depression    GERD (gastroesophageal reflux disease)    Headache    migraines   Hx of squamous cell carcinoma 07/11/2016   Right distal dorsum lat forearm    Hypertension    IBS (irritable bowel syndrome)    Palpitations    PFO (patent foramen ovale)    small by cardiac CTA 01/2019   PONV (postoperative nausea and vomiting)    Restless legs syndrome    Squamous cell carcinoma of skin 12/202/2017   right distal dorsum lateral forearm   Past Surgical History:  Procedure Laterality Date   ABDOMINOPLASTY     BACK SURGERY     1 laminectomy, 1 fusion   COLONOSCOPY     COLONOSCOPY WITH PROPOFOL N/A 01/17/2017   Procedure: COLONOSCOPY WITH PROPOFOL;  Surgeon: Lollie Sails, MD;  Location: [facility] ENDOSCOPY;  Service: Endoscopy;  Laterality: N/A;   WISDOM TOOTH EXTRACTION     Patient Active Problem List   Diagnosis Date Noted   PFO (patent foramen ovale)    CAD (coronary artery disease), native coronary artery    Acute CVA (cerebrovascular accident) ([place]) 06/15/2018   Depression 10/16/2017   Migraine 10/16/2017   Hyperglycemia, unspecified 07/10/2017   Hyperglycemia 07/10/2017   H/O dizziness 08/02/2016   Cervical disc disorder with radiculopathy of cervical region 04/12/2016   Nonallopathic  lesion of thoracic region 04/12/2016   Nonallopathic lesion of rib cage 04/12/2016   Facet arthritis of cervical region 03/21/2016   Hyperlipidemia 01/26/2015   Labral tear of shoulder, right, subsequent encounter 12/04/2014   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   Impingement syndrome of right shoulder 05/28/2014   Right supraspinatus tenosynovitis 05/28/2014   Arthritis pain 04/03/2014   Fatigue 04/03/2014   Cervical intraepithelial neoplasia grade 1 06/20/2012   IBS (irritable bowel syndrome) 06/20/2012   Lumbar disc disease 06/20/2012    PCP: Caryl Comes  REFERRING PROVIDER: Buscher , NP  REFERRING DIAG: N81.10 (ICD-10-CM) - Cystocele, unspecified   Rationale for Evaluation and Treatment Rehabilitation  THERAPY DIAG:  Sacrococcygeal disorders, not elsewhere classified  Other abnormalities of gait and mobility  Other lack of coordination  Muscle weakness (generalized)  ONSET DATE:  03/09/22   SUBJECTIVE:               SUBJECTIVE STATEMENT on today  Pt reports she noticed less pressure sensation with the prolapse.  Today, she feels her neck pain has returned gradually as her epiduralis wearing off. More pain on the L upper shoulder and achiness in her L thumb, numbness along R last 3 fingers.  SUBJECTIVE STATEMENT on EVAL 03/17/22 : 1) Prolapse:   Pt noticed her prolapse when taking a bath. Her gynecologist confirmed a cystocele. Pt has noticed the pressure , mild cramping in low ab area, fullness, and more frequent trips to the bathroom. Pt tries to not push.  Urinary frequency from 4 x / 2hr.    Difficulty with completing urination/ emptying.  Denied SUI, UI. Daily bowel movement with Colace, Type 4 consistency  2) neck pain: with L shoulder and both scapula which worsens as the day goes on . Pt also experiences R  tingling sensation along the neck when turning R. Since her epidural, the tingling has improved in her fingers.  See cervical surgeries in Pertinent Hx.  Fitness routine: walking 30 min but in the last 2-3 months , pt has not been as active with neck pain.    Pt used to weight lift, crunches.      PERTINENT HISTORY:   partial abdominoplasty umbilical hernia  back surgery laminectomyL5 and  fusion of L4-S1  neck surgery C6-7 arthoplasty  Arthritis C2-C6  pending neck surgery 06/14/22 for C6-7    PAIN:  Are you having pain? No  PRECAUTIONS: None  WEIGHT BEARING RESTRICTIONS: No  FALLS:  Has patient fallen in last 6 months? 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%   PLOF: Independent  PATIENT GOALS:   Keep the prolapse from worsening    OBJECTIVE:      [facility] PT Assessment - 04/07/22 0813       Observation/Other Assessments   Scoliosis Levelled pelvic, L shoulder higher      Sensation   Additional Comments decreased sensation along median nn on R ( pre Tx from upper trap to first 3 digits) , only decreased sensation only at upper trap , centralized post Tx)      Coordination   Coordination and Movement Description ab ovberuse with deep core, did not progress to level 2      Palpation   Palpation comment L anterior rib limited excursion, deviated segments to the R at 5-7, tightness along medial /lower scapula L, intercostals L             [facility] Adult PT Treatment/Exercise - 04/07/22 0844       Neuro Re-ed    Neuro Re-ed Details  cued for new HEP and less ab overuse with deep core level 1      Modalities   Modalities Moist Heat      Moist Heat Therapy   Number Minutes Moist Heat 5 Minutes    Moist Heat Location --   L thoracic , during new instruction for deep core level 1     Manual Therapy   Manual therapy comments STM/MWM at  problem areas noted in assessment to promote mobility at thoracic spine to improve neck pain and IAP system              HOME EXERCISE PROGRAM: See pt instruction section    ASSESSMENT:  CLINICAL IMPRESSION:    Further provided manual Tx to address L ribs/ deviated thoracic segments to optimize mobility of scapular and diaphramgatic excursion for deep core system ( IAP system to minimize R radicular cervical pain and worsening of prolapse.   Post Tx, pt showed improved mobility at L thoracic area.  Pt also showed centralization of median nn pain on  RUE.   Continue to apply manual Tx next session to this area to realign spine at thorax and C/T junction to improve segmental mobility and optimize diaphragm.  Progressed to deep core  system coordination but withheld level 2 progression due to pt requiring cues for less ab overuse.    Plan to test cervical endurance next session.   Pt benefits from skilled PT.    OBJECTIVE IMPAIRMENTS decreased activity tolerance, decreased coordination, decreased endurance, decreased mobility, difficulty walking, decreased ROM, decreased strength, decreased safety awareness, hypomobility, increased muscle spasms, impaired flexibility, improper body mechanics, postural dysfunction, and pain. scar restrictions   ACTIVITY LIMITATIONS  self-care, sleep, home chores, work tasks    PARTICIPATION LIMITATIONS:  community, gym activities    [place]   scoliosis, past surgeries , standing at work   are also affecting patient's functional outcome.    REHAB POTENTIAL: Good   CLINICAL DECISION MAKING: Evolving/moderate complexity   EVALUATION COMPLEXITY: Moderate    PATIENT EDUCATION:    Education details: Showed pt anatomy images. Explained muscles attachments/ connection, physiology of deep core system/ spinal- thoracic-pelvis-lower kinetic chain as they relate to pt's presentation, Sx, and past Hx. Explained what and how these areas of  deficits need to be restored to balance and function    See Therapeutic activity / neuromuscular re-education section  Answered pt's questions.   Person educated: Patient Education method: Explanation, Demonstration, Tactile cues, Verbal cues, and Handouts Education comprehension: verbalized understanding, returned demonstration, verbal cues required, tactile cues required, and needs further education     PLAN: PT FREQUENCY: 1x/week   PT DURATION: 10 weeks   PLANNED INTERVENTIONS: Therapeutic exercises, Therapeutic activity, Neuromuscular re-education, Balance training, Gait training, Patient/Family education, Self Care, Joint mobilization, Spinal mobilization, Moist heat, Taping, and Manual therapy.   PLAN FOR NEXT SESSION: See clinical impression for plan     GOALS: Goals reviewed with patient? Yes  SHORT TERM GOALS: Target date: 05/26/2022    Pt will demo IND with HEP                    Baseline: Not IND            Goal status: INITIAL   LONG TERM GOALS: Target date: 05/26/2022    1.Pt will demo proper deep core coordination without chest breathing and optimal excursion of diaphragm/pelvic floor in order to promote spinal stability and pelvic floor function  Baseline: dyscoordination Goal status: INITIAL  2.  Pt will demo > 5 pt change on FOTO  to improve QOL and function   Pelvic Pain baseline -13 pts  PFDI Urinary baseline -4 pts   Urinary Problem baseline-63 pts    Goal status: INITIAL  3.  Pt will demo proper body mechanics in against gravity tasks and ADLs  work tasks, fitness  to minimize straining pelvic floor / back                  Baseline: not IND, improper form that places strain on pelvic floor                Goal status: INITIAL    4. Pt will demo levelled pelvic girdle and shoulder height in order to progress to deep core strengthening HEP and restore mobility at spine, pelvis, gait, posture   Baseline: R  iliac crest / L shoulder higher   Goal status: INITIAL    5. Pt will demo report complete urination across 75% of the time in  order to minimize risk for UTIs  Baseline: Difficulty with completing urination.  Goal status: INITIAL   6.  Pt will report decreased urinary frequency to < 2 x / 2 hours in order to participate in community activities. Baseline:  Urinary frequency from 4 x / 2hr.  Goal status: INITIAL    Jerl Mina, PT 04/07/2022, 8:12 AM

## 2022-04-19 ENCOUNTER — Ambulatory Visit: Payer: 59 | Admitting: Physical Therapy

## 2022-04-19 DIAGNOSIS — M542 Cervicalgia: Secondary | ICD-10-CM

## 2022-04-19 DIAGNOSIS — R278 Other lack of coordination: Secondary | ICD-10-CM | POA: Diagnosis not present

## 2022-04-19 DIAGNOSIS — M6281 Muscle weakness (generalized): Secondary | ICD-10-CM | POA: Diagnosis not present

## 2022-04-19 DIAGNOSIS — M533 Sacrococcygeal disorders, not elsewhere classified: Secondary | ICD-10-CM | POA: Diagnosis not present

## 2022-04-19 DIAGNOSIS — R2689 Other abnormalities of gait and mobility: Secondary | ICD-10-CM | POA: Diagnosis not present

## 2022-04-19 DIAGNOSIS — M25522 Pain in left elbow: Secondary | ICD-10-CM | POA: Diagnosis not present

## 2022-04-19 DIAGNOSIS — M5440 Lumbago with sciatica, unspecified side: Secondary | ICD-10-CM | POA: Diagnosis not present

## 2022-04-19 DIAGNOSIS — M5412 Radiculopathy, cervical region: Secondary | ICD-10-CM | POA: Diagnosis not present

## 2022-04-19 DIAGNOSIS — R293 Abnormal posture: Secondary | ICD-10-CM | POA: Diagnosis not present

## 2022-04-19 NOTE — Patient Instructions (Addendum)
   Double chin, [name]   On belly, palms under armpits, elbows pointed to ceiling  Inhale: lengthen crown of the head away from shoulders Exhale, feel belly hug in and press palms into the floor, squeezing elbows/shoulder blades towards each other while chest lifts about slightlyoff the floor.  Pencils at armpits, shoulders away from ears  You should feel the hinging movement at mid back and not the low back.   10 reps , 3 sec holds   ___     [place] pose  Pillow under hips if needed for decreased low back pain  Palms face midline by hips  Finger tips shooting straight down  Imagine holding pencil under your armpits Draw shoulders away from ears Inhale Exhale lift chest up slightly without feeling it in your back. The bend happens in the midback  (keep chin tucked)   5 sec, 10 reps    ___  Standing mini squat [place]  Standing version with fist pressing against table/ counter, pressing the entire time Inhale into squat, hands wide, exhale standing up with hands come closer to midline while still pressing fists against edge of table   Minisquat: Scoot buttocks back slight, hinge like you are looking at your reflection on a pond  Knees behind toes, feet  slightly wider than hips, toes turned out slightly Inhale to "smell flowers"  Exhale on the rise "like rocket"  Do not lock knees, have more weight across ballmounds of feet, toes relaxed    10 reps

## 2022-04-19 NOTE — Therapy (Signed)
OUTPATIENT PHYSICAL THERAPY TREATMENT    Patient Name: ASTRIA JORDAHL MRN: [id number] DOB:September 13, 1960, 61 y.o., female Today's Date: 04/19/2022.pten   PT End of Session - 04/19/22 0850     Visit Number 4    Number of Visits 10    Date for PT Re-Evaluation 05/24/22    PT Start Time 0845    PT Stop Time 0930    PT Time Calculation (min) 45 min    Activity Tolerance Patient tolerated treatment well;No increased pain    Behavior During Therapy WFL for tasks assessed/performed              Past Medical History:  Diagnosis Date   Arthritis    CAD (coronary artery disease), native coronary artery    mild non calcified plaque in mid LAD and prox LCx on coronary CTA 01/2019   Depression    GERD (gastroesophageal reflux disease)    Headache    migraines   Hx of squamous cell carcinoma 07/11/2016   Right distal dorsum lat forearm    Hypertension    IBS (irritable bowel syndrome)    Palpitations    PFO (patent foramen ovale)    small by cardiac CTA 01/2019   PONV (postoperative nausea and vomiting)    Restless legs syndrome    Squamous cell carcinoma of skin 12/202/2017   right distal dorsum lateral forearm   Past Surgical History:  Procedure Laterality Date   ABDOMINOPLASTY     BACK SURGERY     1 laminectomy, 1 fusion   COLONOSCOPY     COLONOSCOPY WITH PROPOFOL N/A 01/17/2017   Procedure: COLONOSCOPY WITH PROPOFOL;  Surgeon: Lollie Sails, MD;  Location: [facility] END[facility]COPY;  Service: Endoscopy;  Laterality: N/A;   WISDOM TOOTH EXTRACTION     Patient Active Problem List   Diagnosis Date Noted   PFO (patent foramen ovale)    CAD (coronary artery disease), native coronary artery    Acute CVA (cerebrovascular accident) ([place]) 06/15/2018   Depression 10/16/2017   Migraine 10/16/2017   Hyperglycemia, unspecified 07/10/2017   Hyperglycemia 07/10/2017   H/O dizziness 08/02/2016   Cervical disc disorder with radiculopathy of cervical region 04/12/2016   Nonallopathic  lesion of thoracic region 04/12/2016   Nonallopathic lesion of rib cage 04/12/2016   Facet arthritis of cervical region 03/21/2016   Hyperlipidemia 01/26/2015   Labral tear of shoulder, right, subsequent encounter 12/04/2014   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   Impingement syndrome of right shoulder 05/28/2014   Right supraspinatus tenosynovitis 05/28/2014   Arthritis pain 04/03/2014   Fatigue 04/03/2014   Cervical intraepithelial neoplasia grade 1 06/20/2012   IBS (irritable bowel syndrome) 06/20/2012   Lumbar disc disease 06/20/2012    PCP: Caryl Comes  REFERRING PROVIDER: Buscher , NP  REFERRING DIAG: N81.10 (ICD-10-CM) - Cystocele, unspecified   Rationale for Evaluation and Treatment Rehabilitation  THERAPY DIAG:  Sacrococcygeal disorders, not elsewhere classified  Other abnormalities of gait and mobility  Other lack of coordination  Muscle weakness (generalized)  Cervicalgia  Abnormal posture  ONSET DATE:  03/09/22   SUBJECTIVE:               SUBJECTIVE STATEMENT on today  Pt reports she still has shoulder pain on L which is 20% better since last session. Pt is getting the hang of the breathing. Pt did not do it everyday. Pt sneezed and no leakage by pulling up her low ab.  Numbness along B UE and fingers have decreased by 50% frequency and intensity.  SUBJECTIVE STATEMENT on EVAL 03/17/22 : 1) Prolapse:   Pt noticed her prolapse when taking a bath. Her gynecologist confirmed a cystocele. Pt has noticed the pressure , mild cramping in low ab area, fullness, and more frequent trips to the bathroom. Pt tries to not push.  Urinary frequency from 4 x / 2hr.    Difficulty with completing urination/ emptying.  Denied SUI, UI. Daily bowel movement with Colace, Type 4 consistency  2) neck pain: with L shoulder and both scapula which worsens  as the day goes on . Pt also experiences R tingling sensation along the neck when turning R. Since her epidural, the tingling has improved in her fingers.  See cervical surgeries in Pertinent Hx.  Fitness routine: walking 30 min but in the last 2-3 months , pt has not been as active with neck pain.    Pt used to weight lift, crunches.      PERTINENT HISTORY:   partial abdominoplasty umbilical hernia  back surgery laminectomyL5 and  fusion of L4-S1  neck surgery C6-7 arthoplasty  Arthritis C2-C6  pending neck surgery 06/14/22 for C6-7    PAIN:  Are you having pain? No  PRECAUTIONS: None  WEIGHT BEARING RESTRICTIONS: No  FALLS:  Has patient fallen in last 6 months? 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 upper traps not overused      Other:   Other/Comments cervical endurance test 45 sec             [facility] Adult PT Treatment/Exercise - 04/19/22 1043       Therapeutic Activites    Other Therapeutic Activities showed anatomy image for cervical endruance mm, progression from isometrics of cervico-scapular stabilization HEP to resistance band strengthening as alternative to weight lifting to continue helping with deeper posterior chain of mm for less forward head/ deviations of spine      Neuro Re-ed    Neuro Re-ed Details  excessive cues for more cervicoscapular stabilization in against gravity positions , see pt instructions . Cued for less lumbar lordosis in mini squat position                HOME EXERCISE PROGRAM: See pt instruction section    ASSESSMENT:  CLINICAL IMPRESSION:  Pt responded positively to  last Tx as pt reported significantly decreased numbness in her hands in frequency and intensity. Pt 's spine is more medially aligned and  pt showed less upper trap overuse.  Initiated cervical endurance test which showed very low endurance.  Initiated cervicoscapular stabilization HEP which pt required cues for proper co-activation. Cued for less lumbar lordosis in mini squat position. Pt would benefit form multidifis strengthening at next session to address her lumbar lordosis associated with her back surgery.   Pt sneezed and no leakage by pulling up her low ab. Plan to progress with deep core level 2 at next session as well.  Pt benefits from skilled PT.    OBJECTIVE IMPAIRMENTS decreased activity tolerance, decreased coordination, decreased endurance, decreased mobility, difficulty walking, decreased ROM, decreased strength, decreased safety awareness, hypomobility, increased muscle spasms, impaired flexibility, improper body mechanics, postural  dysfunction, and pain. scar restrictions   ACTIVITY LIMITATIONS  self-care, sleep, home chores, work tasks    PARTICIPATION LIMITATIONS:  community, gym activities    [place]   scoliosis, past surgeries , standing at work   are also affecting patient's functional outcome.    REHAB POTENTIAL: Good   CLINICAL DECISION MAKING: Evolving/moderate complexity   EVALUATION COMPLEXITY: Moderate    PATIENT EDUCATION:    Education details: Showed pt anatomy images. Explained muscles attachments/ connection, physiology of deep core system/ spinal- thoracic-pelvis-lower kinetic chain as they relate to pt's presentation, Sx, and past Hx. Explained what and how these areas of deficits need to be restored to balance and function    See Therapeutic activity / neuromuscular re-education section  Answered pt's questions.   Person educated: Patient Education method: Explanation, Demonstration, Tactile cues, Verbal cues, and Handouts Education  comprehension: verbalized understanding, returned demonstration, verbal cues required, tactile cues required, and needs further education     PLAN: PT FREQUENCY: 1x/week   PT DURATION: 10 weeks   PLANNED INTERVENTIONS: Therapeutic exercises, Therapeutic activity, Neuromuscular re-education, Balance training, Gait training, Patient/Family education, Self Care, Joint mobilization, Spinal mobilization, Moist heat, Taping, and Manual therapy.   PLAN FOR NEXT SESSION: See clinical impression for plan     GOALS: Goals reviewed with patient? Yes  SHORT TERM GOALS: Target date: 05/26/2022    Pt will demo IND with HEP                    Baseline: Not IND            Goal status: INITIAL   LONG TERM GOALS: Target date: 05/26/2022    1.Pt will demo proper deep core coordination without chest breathing and optimal excursion of diaphragm/pelvic floor in order to promote spinal stability and pelvic floor function  Baseline: dyscoordination Goal status: INITIAL  2.  Pt will demo > 5 pt change on FOTO  to improve QOL and function   Pelvic Pain baseline -13 pts  PFDI Urinary baseline -4 pts   Urinary Problem baseline-63 pts    Goal status: INITIAL  3.  Pt will demo proper body mechanics in against gravity tasks and ADLs  work tasks, fitness  to minimize straining pelvic floor / back                  Baseline: not IND, improper form that places strain on pelvic floor                Goal status: INITIAL    4. Pt will demo levelled pelvic girdle and shoulder height in order to progress to deep core strengthening HEP and restore mobility at spine, pelvis, gait, posture   Baseline: R  iliac crest / L shoulder higher  Goal status:  MET     5. Pt will demo report complete urination across 75% of the time in order to minimize risk for UTIs  Baseline: Difficulty with completing urination.  Goal status: INITIAL   6.  Pt will report decreased urinary frequency to < 2 x / 2 hours in  order to participate in community activities. Baseline:  Urinary frequency from 4 x / 2hr.  Goal status: INITIAL                           7. Pt will increase her cervical endurance test time to > 90 sec to minimize forward head posture and  optimize IAP system for postural stability, pelvic function, and less neck pain  Baseline:  45 sec              Goal status:  Initial   Jerl Mina, PT 04/19/2022, 10:47 AM

## 2022-04-28 ENCOUNTER — Ambulatory Visit: Payer: 59 | Admitting: Physical Therapy

## 2022-04-28 DIAGNOSIS — R2689 Other abnormalities of gait and mobility: Secondary | ICD-10-CM

## 2022-04-28 DIAGNOSIS — M542 Cervicalgia: Secondary | ICD-10-CM | POA: Diagnosis not present

## 2022-04-28 DIAGNOSIS — M5440 Lumbago with sciatica, unspecified side: Secondary | ICD-10-CM

## 2022-04-28 DIAGNOSIS — R293 Abnormal posture: Secondary | ICD-10-CM

## 2022-04-28 DIAGNOSIS — M533 Sacrococcygeal disorders, not elsewhere classified: Secondary | ICD-10-CM

## 2022-04-28 DIAGNOSIS — M25522 Pain in left elbow: Secondary | ICD-10-CM | POA: Diagnosis not present

## 2022-04-28 DIAGNOSIS — M6281 Muscle weakness (generalized): Secondary | ICD-10-CM

## 2022-04-28 DIAGNOSIS — R278 Other lack of coordination: Secondary | ICD-10-CM

## 2022-04-28 DIAGNOSIS — M5412 Radiculopathy, cervical region: Secondary | ICD-10-CM

## 2022-04-28 NOTE — Patient Instructions (Addendum)
Standing awareness MORE WEIGHT on ballmounds, ribs aligned with ASIS to not have too arched back    BLUE band   Multifidis twist   Band is on doorknob: stand facing perpendicular to door , sit halfway towards front of chair, firm through 4 points of contact at buttocks and feet. Feet are placed hip with apart.   MORE WEIGHT on ballmounds, ribs aligned with ASIS to not have too arched back  Twisting trunk without moving the hips and knees Hold band at the level of ribcage, elbows bent,shoulder blades roll back and down like squeezing a pencil under armpit   Exhale twist,.10-15 deg away from door without moving your hips/ knees, press more weight on the side of the sitting bones/ foot opp of your direction of turn as your counterweight. Continue to maintain equal weight through legs.  Keep knee unlocked.  30 reps    ___  Lying on back, knees bent    band under ballmounds  while laying on back w/ knees bent  "W" exercise  20 reps  Band is placed under feet, knees bent, feet are hip width apart Hold band with thumbs point out, keep upper arm and elbow touching the bed the whole time  - inhale and then exhale pull bands by bending elbows hands move in a "w"  (feel shoulder blades squeezing)   ________________   Discontinuous locust    Continue Brushing Breathing 10 breaths  Belly - cricket

## 2022-04-28 NOTE — Therapy (Signed)
OUTPATIENT PHYSICAL THERAPY TREATMENT    Patient Name: MARYHELEN LINDLER MRN: [id number] DOB:Feb 22, 1961, 61 y.o., female Today's Date: 04/28/2022.pten   PT End of Session - 04/28/22 0809     Visit Number 5    Number of Visits 10    Date for PT Re-Evaluation 05/24/22    PT Start Time 0804    PT Stop Time 0845    PT Time Calculation (min) 41 min    Activity Tolerance Patient tolerated treatment well;No increased pain    Behavior During Therapy WFL for tasks assessed/performed              Past Medical History:  Diagnosis Date   Arthritis    CAD (coronary artery disease), native coronary artery    mild non calcified plaque in mid LAD and prox LCx on coronary CTA 01/2019   Depression    GERD (gastroesophageal reflux disease)    Headache    migraines   Hx of squamous cell carcinoma 07/11/2016   Right distal dorsum lat forearm    Hypertension    IBS (irritable bowel syndrome)    Palpitations    PFO (patent foramen ovale)    small by cardiac CTA 01/2019   PONV (postoperative nausea and vomiting)    Restless legs syndrome    Squamous cell carcinoma of skin 12/202/2017   right distal dorsum lateral forearm   Past Surgical History:  Procedure Laterality Date   ABDOMINOPLASTY     BACK SURGERY     1 laminectomy, 1 fusion   COLONOSCOPY     COLONOSCOPY WITH PROPOFOL N/A 01/17/2017   Procedure: COLONOSCOPY WITH PROPOFOL;  Surgeon: Lollie Sails, MD;  Location: [facility] ENDOSCOPY;  Service: Endoscopy;  Laterality: N/A;   WISDOM TOOTH EXTRACTION     Patient Active Problem List   Diagnosis Date Noted   PFO (patent foramen ovale)    CAD (coronary artery disease), native coronary artery    Acute CVA (cerebrovascular accident) ([place]) 06/15/2018   Depression 10/16/2017   Migraine 10/16/2017   Hyperglycemia, unspecified 07/10/2017   Hyperglycemia 07/10/2017   H/O dizziness 08/02/2016   Cervical disc disorder with radiculopathy of cervical region 04/12/2016   Nonallopathic  lesion of thoracic region 04/12/2016   Nonallopathic lesion of rib cage 04/12/2016   Facet arthritis of cervical region 03/21/2016   Hyperlipidemia 01/26/2015   Labral tear of shoulder, right, subsequent encounter 12/04/2014   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   Impingement syndrome of right shoulder 05/28/2014   Right supraspinatus tenosynovitis 05/28/2014   Arthritis pain 04/03/2014   Fatigue 04/03/2014   Cervical intraepithelial neoplasia grade 1 06/20/2012   IBS (irritable bowel syndrome) 06/20/2012   Lumbar disc disease 06/20/2012    PCP: Caryl Comes  REFERRING PROVIDER: Buscher , NP  REFERRING DIAG: N81.10 (ICD-10-CM) - Cystocele, unspecified   Rationale for Evaluation and Treatment Rehabilitation  THERAPY DIAG:  Sacrococcygeal disorders, not elsewhere classified  Other abnormalities of gait and mobility  Other lack of coordination  Muscle weakness (generalized)  Cervicalgia  Abnormal posture  Pain in left elbow  Radiculopathy, cervical region  Acute left-sided low back pain with sciatica, sciatica laterality unspecified  ONSET DATE:  03/09/22   SUBJECTIVE:               SUBJECTIVE STATEMENT on today  Pt reports since last Tx, pt woke with numbness in R hand only twice instead of 4 x per week.   Pt reported last week her LBP was a 2/10, this week 4/10 pain at  the L/S junction. This is the first time she is having back pain in 10 years. Pt first noticed the back pain returning with the prolapse.                                                                                                                                      The locust exercise triggered more neck pain so she stopped doing that one.   SUBJECTIVE STATEMENT on EVAL 03/17/22 : 1) Prolapse:   Pt noticed her prolapse when taking a bath. Her gynecologist confirmed a cystocele. Pt has noticed the pressure , mild cramping in low ab area, fullness, and more frequent trips to the bathroom. Pt tries to not push.   Urinary frequency from 4 x / 2hr.    Difficulty with completing urination/ emptying.  Denied SUI, UI. Daily bowel movement with Colace, Type 4 consistency  2) neck pain: with L shoulder and both scapula which worsens as the day goes on . Pt also experiences R tingling sensation along the neck when turning R. Since her epidural, the tingling has improved in her fingers.  See cervical surgeries in Pertinent Hx.  Fitness routine: walking 30 min but in the last 2-3 months , pt has not been as active with neck pain.    Pt used to weight lift, crunches.      PERTINENT HISTORY:   partial abdominoplasty umbilical hernia  back surgery laminectomyL5 and  fusion of L4-S1  neck surgery C6-7 arthoplasty  Arthritis C2-C6  pending neck surgery 06/14/22 for C6-7    PAIN:  Are you having pain? No  PRECAUTIONS: None  WEIGHT BEARING RESTRICTIONS: No  FALLS:  Has patient fallen in last 6 months? 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%   PLOF: Independent  PATIENT GOALS:   Keep the prolapse from worsening    OBJECTIVE:     [facility] PT Assessment - 04/28/22 0817       Coordination   Coordination and Movement Description poor dissociation of trunk/ pelvis, posterior COM iwith multidifis, poor scapulothoracic activation with resistance band training,      Posture/Postural Control   Posture Comments posterior COM, hyperextended knees, heel WBing, poor co-activation of deep core      AROM   Overall AROM Comments cervical sideflexion 25 deg B, flexion 40 deg, ext 35 deg,  rotation L 40 deg, R 45 deg             [facility] Adult PT Treatment/Exercise - 04/28/22 0915       Therapeutic Activites    Other Therapeutic Activities showed anatomy/ physiology of multidifis, scapulo-cervico-thoracolumbar strengthening muscles, discussed alternatives to weight lifting and not  overtraining upper trap / neck muscles,      Neuro Re-ed    Neuro Re-ed Details  excessive cues for feet propioception, less hyperextended knees and lumbar lordosis, less chest breathing, and cued for dissaociation of trunk and pelvis.      Exercises   Exercises Other Exercises    Other Exercises  see pt instructions                 HOME EXERCISE PROGRAM: See pt instruction section    ASSESSMENT:  CLINICAL IMPRESSION: Pt experienced 50% less frequency with numbness in RUE last week. Pt's alignment along spine is improving and she has less overuse of upper traps. However,   Pt's cervical sideflexion remains limited bilaterally and will be addressed at upcoming sessions to promote more segmental mobility along spine with considerations of  the limitation with past cervical fusion.   Initiated multidifis strengthening with blue band but required excessive cues for proper technique and propioception along multiple parts of body. Also provided cervico/scapular /thoracic stabilization resistance HEP which pt also required cues to learn coordination of deep core system with exertion with blue band. Pt required cues for more anterior COM and co-activation of deep core system in standing posture which helped to decrease her LBP. PT also required cues for less lumbar lordosis in all exercises and standing posture.   Anticipate today's new exercises will help train deeper musculature to help pt maintain upright posture and minimzie straining pelvic floor/ worsening prolapse. Pt will still need explanation of alternative to weight training to minimzie worsening of prolapse, neck and LPB.    . Plan to progress with deep core level 2 at next session as well.  Pt benefits from skilled PT.    OBJECTIVE IMPAIRMENTS decreased activity tolerance, decreased coordination, decreased endurance, decreased mobility, difficulty walking, decreased ROM, decreased strength, decreased safety awareness,  hypomobility, increased muscle spasms, impaired flexibility, improper body mechanics, postural dysfunction, and pain. scar restrictions   ACTIVITY LIMITATIONS  self-care, sleep, home chores, work tasks    PARTICIPATION LIMITATIONS:  community, gym activities    [place]   scoliosis, past surgeries , standing at work   are also affecting patient's functional outcome.    REHAB POTENTIAL: Good   CLINICAL DECISION MAKING: Evolving/moderate complexity   EVALUATION COMPLEXITY: Moderate    PATIENT EDUCATION:    Education details: Showed pt anatomy images. Explained muscles attachments/ connection, physiology of deep core system/ spinal- thoracic-pelvis-lower kinetic chain as they relate to pt's presentation, Sx, and past Hx. Explained what and how these areas of deficits need to be restored to balance and function    See Therapeutic activity / neuromuscular re-education section  Answered pt's questions.   Person educated: Patient Education method: Explanation, Demonstration, Tactile cues, Verbal cues, and Handouts Education comprehension: verbalized understanding, returned demonstration, verbal cues required, tactile cues required, and needs further education     PLAN: PT FREQUENCY: 1x/week   PT DURATION: 10 weeks   PLANNED INTERVENTIONS: Therapeutic exercises, Therapeutic activity, Neuromuscular re-education, Balance training, Gait training, Patient/Family education, Self Care, Joint mobilization, Spinal mobilization, Moist heat, Taping, and Manual therapy.   PLAN FOR NEXT SESSION: See clinical impression for plan     GOALS: Goals reviewed with patient? Yes  SHORT TERM GOALS: Target date: 05/26/2022    Pt will demo IND with HEP                    Baseline: Not IND            Goal status: INITIAL   LONG TERM GOALS: Target date: 05/26/2022  1.Pt will demo proper deep core coordination without chest breathing and optimal excursion of diaphragm/pelvic floor in  order to promote spinal stability and pelvic floor function  Baseline: dyscoordination Goal status: INITIAL  2.  Pt will demo > 5 pt change on FOTO  to improve QOL and function   Pelvic Pain baseline -13 pts  PFDI Urinary baseline -4 pts   Urinary Problem baseline-63 pts    Goal status: INITIAL  3.  Pt will demo proper body mechanics in against gravity tasks and ADLs  work tasks, fitness  to minimize straining pelvic floor / back                  Baseline: not IND, improper form that places strain on pelvic floor                Goal status: INITIAL    4. Pt will demo levelled pelvic girdle and shoulder height in order to progress to deep core strengthening HEP and restore mobility at spine, pelvis, gait, posture   Baseline: R  iliac crest / L shoulder higher  Goal status:  MET     5. Pt will demo report complete urination across 75% of the time in order to minimize risk for UTIs  Baseline: Difficulty with completing urination.  Goal status: INITIAL   6.  Pt will report decreased urinary frequency to < 2 x / 2 hours in order to participate in community activities. Baseline:  Urinary frequency from 4 x / 2hr.  Goal status: INITIAL                           7. Pt will increase her cervical endurance test time to > 90 sec to minimize forward head posture and optimize IAP system for postural stability, pelvic function, and less neck pain  Baseline:  45 sec              Goal status:  Initial   Jerl Mina, PT 04/28/2022, 8:11 AM

## 2022-05-04 ENCOUNTER — Other Ambulatory Visit: Payer: Self-pay

## 2022-05-04 MED ORDER — CYCLOBENZAPRINE HCL 10 MG PO TABS
10.0000 mg | ORAL_TABLET | Freq: Every evening | ORAL | 5 refills | Status: DC | PRN
  Filled 2022-05-04: qty 20, 20d supply, fill #0
  Filled 2022-08-11 (×2): qty 20, 20d supply, fill #1
  Filled 2022-09-18: qty 20, 20d supply, fill #2

## 2022-05-04 MED ORDER — TIZANIDINE HCL 2 MG PO TABS
2.0000 mg | ORAL_TABLET | Freq: Three times a day (TID) | ORAL | 11 refills | Status: DC | PRN
  Filled 2022-05-04: qty 60, 20d supply, fill #0
  Filled 2022-08-11: qty 60, 20d supply, fill #1
  Filled 2022-09-18: qty 60, 20d supply, fill #2

## 2022-05-04 MED ORDER — ZOLPIDEM TARTRATE ER 6.25 MG PO TBCR
6.2500 mg | EXTENDED_RELEASE_TABLET | Freq: Every evening | ORAL | 5 refills | Status: DC | PRN
  Filled 2022-05-04: qty 30, 30d supply, fill #0

## 2022-05-04 MED ORDER — SUCRALFATE 1 G PO TABS
1.0000 g | ORAL_TABLET | Freq: Three times a day (TID) | ORAL | 2 refills | Status: DC
  Filled 2022-05-04: qty 28, 7d supply, fill #0
  Filled 2022-12-15: qty 28, 7d supply, fill #1
  Filled 2023-02-14: qty 28, 7d supply, fill #2

## 2022-05-06 ENCOUNTER — Ambulatory Visit: Payer: 59 | Attending: Registered Nurse | Admitting: Physical Therapy

## 2022-05-06 DIAGNOSIS — M542 Cervicalgia: Secondary | ICD-10-CM

## 2022-05-06 DIAGNOSIS — R293 Abnormal posture: Secondary | ICD-10-CM | POA: Diagnosis not present

## 2022-05-06 DIAGNOSIS — M533 Sacrococcygeal disorders, not elsewhere classified: Secondary | ICD-10-CM | POA: Diagnosis not present

## 2022-05-06 DIAGNOSIS — R278 Other lack of coordination: Secondary | ICD-10-CM

## 2022-05-06 DIAGNOSIS — R2689 Other abnormalities of gait and mobility: Secondary | ICD-10-CM | POA: Diagnosis not present

## 2022-05-06 DIAGNOSIS — M6281 Muscle weakness (generalized): Secondary | ICD-10-CM | POA: Diagnosis not present

## 2022-05-06 NOTE — Therapy (Addendum)
OUTPATIENT PHYSICAL THERAPY TREATMENT    Patient Name: Lori Morales MRN: [id number] DOB:13-Feb-1961, 61 y.o., female Today's Date: 05/06/2022.pten   PT End of Session - 05/06/22 0807     Visit Number 6    Number of Visits 10    Date for PT Re-Evaluation 05/24/22    PT Start Time 0803    PT Stop Time 0845    PT Time Calculation (min) 42 min    Activity Tolerance Patient tolerated treatment well;No increased pain    Behavior During Therapy WFL for tasks assessed/performed              Past Medical History:  Diagnosis Date   Arthritis    CAD (coronary artery disease), native coronary artery    mild non calcified plaque in mid LAD and prox LCx on coronary CTA 01/2019   Depression    GERD (gastroesophageal reflux disease)    Headache    migraines   Hx of squamous cell carcinoma 07/11/2016   Right distal dorsum lat forearm    Hypertension    IBS (irritable bowel syndrome)    Palpitations    PFO (patent foramen ovale)    small by cardiac CTA 01/2019   PONV (postoperative nausea and vomiting)    Restless legs syndrome    Squamous cell carcinoma of skin 12/202/2017   right distal dorsum lateral forearm   Past Surgical History:  Procedure Laterality Date   ABDOMINOPLASTY     BACK SURGERY     1 laminectomy, 1 fusion   COLONOSCOPY     COLONOSCOPY WITH PROPOFOL N/A 01/17/2017   Procedure: COLONOSCOPY WITH PROPOFOL;  Surgeon: Lollie Sails, MD;  Location: [facility] ENDOSCOPY;  Service: Endoscopy;  Laterality: N/A;   WISDOM TOOTH EXTRACTION     Patient Active Problem List   Diagnosis Date Noted   PFO (patent foramen ovale)    CAD (coronary artery disease), native coronary artery    Acute CVA (cerebrovascular accident) ([place]) 06/15/2018   Depression 10/16/2017   Migraine 10/16/2017   Hyperglycemia, unspecified 07/10/2017   Hyperglycemia 07/10/2017   H/O dizziness 08/02/2016   Cervical disc disorder with radiculopathy of cervical region 04/12/2016   Nonallopathic lesion  of thoracic region 04/12/2016   Nonallopathic lesion of rib cage 04/12/2016   Facet arthritis of cervical region 03/21/2016   Hyperlipidemia 01/26/2015   Labral tear of shoulder, right, subsequent encounter 12/04/2014   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   Impingement syndrome of right shoulder 05/28/2014   Right supraspinatus tenosynovitis 05/28/2014   Arthritis pain 04/03/2014   Fatigue 04/03/2014   Cervical intraepithelial neoplasia grade 1 06/20/2012   IBS (irritable bowel syndrome) 06/20/2012   Lumbar disc disease 06/20/2012    PCP: Caryl Comes  REFERRING PROVIDER: Buscher , NP  REFERRING DIAG: N81.10 (ICD-10-CM) - Cystocele, unspecified   Rationale for Evaluation and Treatment Rehabilitation  THERAPY DIAG:  Sacrococcygeal disorders, not elsewhere classified  Other abnormalities of gait and mobility  Other lack of coordination  Muscle weakness (generalized)  Cervicalgia  Abnormal posture  ONSET DATE:  03/09/22   SUBJECTIVE:               SUBJECTIVE STATEMENT on today  Pt reports her numbness in R hand across 2-3 x instead of 4 x per week.  Only once woke with it with numbness in her hand which is a lot better compared to 4-5 x.    Pt reported last week her LBP was a 2/10, this week 4/10 pain at the L/S junction.  This is the first time she is having back pain in 10 years. Pt first noticed the back pain returning with the prolapse.                                                                                                                                      The locust exercise triggered more neck pain so she stopped doing that one.   SUBJECTIVE STATEMENT on EVAL 03/17/22 : 1) Prolapse:   Pt noticed her prolapse when taking a bath. Her gynecologist confirmed a cystocele. Pt has noticed the pressure , mild cramping in low ab area, fullness, and more frequent trips to the bathroom. Pt tries to not push.  Urinary frequency from 4 x / 2hr.    Difficulty with completing urination/  emptying.  Denied SUI, UI. Daily bowel movement with Colace, Type 4 consistency  2) neck pain: with L shoulder and both scapula which worsens as the day goes on . Pt also experiences R tingling sensation along the neck when turning R. Since her epidural, the tingling has improved in her fingers.  See cervical surgeries in Pertinent Hx.  Fitness routine: walking 30 min but in the last 2-3 months , pt has not been as active with neck pain.    Pt used to weight lift, crunches.      PERTINENT HISTORY:   partial abdominoplasty umbilical hernia  back surgery laminectomyL5 and  fusion of L4-S1  neck surgery C6-7 arthoplasty  Arthritis C2-C6  pending neck surgery 06/14/22 for C6-7    PAIN:  Are you having pain? No  PRECAUTIONS: None  WEIGHT BEARING RESTRICTIONS: No  FALLS:  Has patient fallen in last 6 months? 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%   PLOF: Independent  PATIENT GOALS:   Keep the prolapse from worsening    OBJECTIVE:     [facility] PT Assessment - 05/06/22 0817       Sensation   Additional Comments decreased sensation R upper trap, and only down at thefirst 3 fingers ( Post Tx: small area of decreased sensation at upper trap, and equal sensation at first 3 fingers)      Coordination   Coordination and Movement Description poor coordination of scapular stabilization, initiates with upper traps      Posture/Postural Control   Posture Comments no cues for more anterior COM across ballmounds and heels      AROM   Overall AROM Comments cervical sideflexion 25 deg B, flexion 40 deg, ext 35 deg,  rotation L 45 deg, R 45 deg      Palpation   Palpation comment tightness atong intercostals T7-10, medial /lower scapula, interspinals, supraspinatus, mm attached to occiput/ spine of scapula,  T5-7, T7-10 hypomobile and deviated slightly  [place] [facility] PT Treatment/Exercise - 05/06/22 0817       Neuro Re-ed    Neuro Re-ed Details  excessive cues for scapular stabilization and less upper trap overuse      Exercises   Exercises Other Exercises    Other Exercises  see pt instructions      Modalities   Modalities Moist Heat      Moist Heat Therapy   Number Minutes Moist Heat 5 Minutes    Moist Heat Location --   unbilled , cervica;/ thoracic     Manual Therapy   Manual therapy comments STM/MWM at problem areas noted in assessment to promote mobility at thoracic spine to improve neck pain and IAP system. modified with graded movement with sideflexion at occiput and not at upper thoracic segments to minimize radicular pain)              HOME EXERCISE PROGRAM: See pt instruction section    ASSESSMENT:  CLINICAL IMPRESSION:     Pt continues to have less nights with numbness along R fingers. Pt showed good carry over with increased cervical rotation compared to last session.   Pt's cervical sideflexion was better achieved at occiput in supine compared to seated position after today's session.  Plan to continue to  address at upcoming sessions to promote more segmental mobility along spine with considerations of  the limitation with past cervical fusion.     Required excessive cues for stabilization scapular mm and decrease overuse of upper trap. Guided pt relaxation practice after which pt reported feeling "relaxed and calm and approachable".   Plan to progress with deep core level 2 at next session as well. Pt's pressure sensation is better but given pt's Hx of episiotomy, plan to assess pelvic floor at upcoming sessions to ensure pt is able to be aware of pelvic floor coordination with deep core mm.  Pt benefits from skilled PT.    OBJECTIVE IMPAIRMENTS decreased activity tolerance, decreased coordination, decreased endurance, decreased mobility, difficulty walking, decreased ROM, decreased strength,  decreased safety awareness, hypomobility, increased muscle spasms, impaired flexibility, improper body mechanics, postural dysfunction, and pain. scar restrictions   ACTIVITY LIMITATIONS  self-care, sleep, home chores, work tasks    PARTICIPATION LIMITATIONS:  community, gym activities    Edmund   scoliosis, past surgeries , standing at work   are also affecting patient's functional outcome.    REHAB POTENTIAL: Good   CLINICAL DECISION MAKING: Evolving/moderate complexity   EVALUATION COMPLEXITY: Moderate    PATIENT EDUCATION:    Education details: Showed pt anatomy images. Explained muscles attachments/ connection, physiology of deep core system/ spinal- thoracic-pelvis-lower kinetic chain as they relate to pt's presentation, Sx, and past Hx. Explained what and how these areas of deficits need to be restored to balance and function    See Therapeutic activity / neuromuscular re-education section  Answered pt's questions.   Person educated: Patient Education method: Explanation, Demonstration, Tactile cues, Verbal cues, and Handouts Education comprehension: verbalized understanding, returned demonstration, verbal cues required, tactile cues required, and needs further education     PLAN: PT FREQUENCY: 1x/week   PT DURATION: 10 weeks   PLANNED INTERVENTIONS: Therapeutic exercises, Therapeutic activity, Neuromuscular re-education, Balance training, Gait training, Patient/Family education, Self Care, Joint mobilization, Spinal mobilization, Moist heat, Taping, and Manual therapy.   PLAN FOR NEXT SESSION: See clinical impression for plan     GOALS: Goals reviewed with patient? Yes  SHORT TERM GOALS: Target date: 05/26/2022    Pt  will demo IND with HEP                    Baseline: Not IND            Goal status: INITIAL   LONG TERM GOALS: Target date: 05/26/2022    1.Pt will demo proper deep core coordination without chest breathing and optimal excursion of  diaphragm/pelvic floor in order to promote spinal stability and pelvic floor function  Baseline: dyscoordination Goal status: INITIAL  2.  Pt will demo > 5 pt change on FOTO  to improve QOL and function   Pelvic Pain baseline -13 pts  PFDI Urinary baseline -4 pts   Urinary Problem baseline-63 pts    Goal status: INITIAL  3.  Pt will demo proper body mechanics in against gravity tasks and ADLs  work tasks, fitness  to minimize straining pelvic floor / back                  Baseline: not IND, improper form that places strain on pelvic floor                Goal status: INITIAL    4. Pt will demo levelled pelvic girdle and shoulder height in order to progress to deep core strengthening HEP and restore mobility at spine, pelvis, gait, posture   Baseline: R  iliac crest / L shoulder higher  Goal status:  MET     5. Pt will demo report complete urination across 75% of the time in order to minimize risk for UTIs  Baseline: Difficulty with completing urination.  Goal status: INITIAL   6.  Pt will report decreased urinary frequency to < 2 x / 2 hours in order to participate in community activities. Baseline:  Urinary frequency from 4 x / 2hr.  Goal status: INITIAL                           7. Pt will increase her cervical endurance test time to > 90 sec to minimize forward head posture and optimize IAP system for postural stability, pelvic function, and less neck pain  Baseline:  45 sec              Goal status:  Initial   Jerl Mina, PT 05/06/2022, 9:17 AM

## 2022-05-06 NOTE — Patient Instructions (Signed)
  At work:   Automotive engineer with head turns :  stand perpendicular to the wall, L side to the wall Tilt head to wall  Place L palm at 7 o clock Chin tuck like you are looking into armpit Look at "[place] on giant map  Swivel head with chin tucked to look in upper corner of ceiling as if you are look at  [place] on giant map "   5 -10 reps   Switch direction, R palm on wall at 5 o 'clock   Chin tuck like you are looking into armpit Look at "[place]  on giant map  Swivel head with chin tucked to look in upper corner of ceiling as if you are look at  [facility] on giant map "   5-10 reps    ___

## 2022-05-11 ENCOUNTER — Ambulatory Visit (INDEPENDENT_AMBULATORY_CARE_PROVIDER_SITE_OTHER): Payer: 59 | Admitting: Dermatology

## 2022-05-11 DIAGNOSIS — D485 Neoplasm of uncertain behavior of skin: Secondary | ICD-10-CM | POA: Diagnosis not present

## 2022-05-11 DIAGNOSIS — D229 Melanocytic nevi, unspecified: Secondary | ICD-10-CM | POA: Diagnosis not present

## 2022-05-11 DIAGNOSIS — Z1283 Encounter for screening for malignant neoplasm of skin: Secondary | ICD-10-CM

## 2022-05-11 DIAGNOSIS — L814 Other melanin hyperpigmentation: Secondary | ICD-10-CM

## 2022-05-11 DIAGNOSIS — D1801 Hemangioma of skin and subcutaneous tissue: Secondary | ICD-10-CM | POA: Diagnosis not present

## 2022-05-11 DIAGNOSIS — L820 Inflamed seborrheic keratosis: Secondary | ICD-10-CM | POA: Diagnosis not present

## 2022-05-11 DIAGNOSIS — L821 Other seborrheic keratosis: Secondary | ICD-10-CM

## 2022-05-11 DIAGNOSIS — L578 Other skin changes due to chronic exposure to nonionizing radiation: Secondary | ICD-10-CM | POA: Diagnosis not present

## 2022-05-11 DIAGNOSIS — Z85828 Personal history of other malignant neoplasm of skin: Secondary | ICD-10-CM

## 2022-05-11 NOTE — Patient Instructions (Signed)
Cryotherapy Aftercare  Wash gently with soap and water everyday.   Apply Vaseline and Band-Aid daily until healed.     Due to recent changes in healthcare laws, you may see results of your pathology and/or laboratory studies on MyChart before the doctors have had a chance to review them. We understand that in some cases there may be results that are confusing or concerning to you. Please understand that not all results are received at the same time and often the doctors may need to interpret multiple results in order to provide you with the best plan of care or course of treatment. Therefore, we ask that you please give us 2 business days to thoroughly review all your results before contacting the office for clarification. Should we see a critical lab result, you will be contacted sooner.   If You Need Anything After Your Visit  If you have any questions or concerns for your doctor, please call our main line at [phone] and press option 4 to reach your doctor's medical assistant. If no one answers, please leave a voicemail as directed and we will return your call as soon as possible. Messages left after 4 pm will be answered the following business day.   You may also send us a message via MyChart. We typically respond to MyChart messages within 1-2 business days.  For prescription refills, please ask your pharmacy to contact our office. Our fax number is [phone].  If you have an urgent issue when the clinic is closed that cannot wait until the next business day, you can page your doctor at the number below.    Please note that while we do our best to be available for urgent issues outside of office hours, we are not available 24/7.   If you have an urgent issue and are unable to reach us, you may choose to seek medical care at your doctor's office, retail clinic, urgent care center, or emergency room.  If you have a medical emergency, please immediately call 911 or go to the  emergency department.  Pager Numbers  - Dr. Kowalski: [phone]  - Dr. Moye: [phone]  - Dr. Stewart: [phone]  In the event of inclement weather, please call our main line at [phone] for an update on the status of any delays or closures.  Dermatology Medication Tips: Please keep the boxes that topical medications come in in order to help keep track of the instructions about where and how to use these. Pharmacies typically print the medication instructions only on the boxes and not directly on the medication tubes.   If your medication is too expensive, please contact our office at [phone] option 4 or send us a message through MyChart.   We are unable to tell what your co-pay for medications will be in advance as this is different depending on your insurance coverage. However, we may be able to find a substitute medication at lower cost or fill out paperwork to get insurance to cover a needed medication.   If a prior authorization is required to get your medication covered by your insurance company, please allow us 1-2 business days to complete this process.  Drug prices often vary depending on where the prescription is filled and some pharmacies may offer cheaper prices.  The website www.goodrx.com contains coupons for medications through different pharmacies. The prices here do not account for what the cost may be with help from insurance (it may be cheaper with your insurance), but the website can   give you the price if you did not use any insurance.  - You can print the associated coupon and take it with your prescription to the pharmacy.  - You may also stop by our office during regular business hours and pick up a GoodRx coupon card.  - If you need your prescription sent electronically to a different pharmacy, notify our office through [facility] MyChart or by phone at [phone] option 4.     Si Usted Necesita Algo Despus de Su Visita  Tambin puede  enviarnos un mensaje a travs de MyChart. Por lo general respondemos a los mensajes de MyChart en el transcurso de 1 a 2 das hbiles.  Para renovar recetas, por favor pida a su farmacia que se ponga en contacto con nuestra oficina. Nuestro nmero de fax es el [phone].  Si tiene un asunto urgente cuando la clnica est cerrada y que no puede esperar hasta el siguiente da hbil, puede llamar/localizar a su doctor(a) al nmero que aparece a continuacin.   Por favor, tenga en cuenta que aunque hacemos todo lo posible para estar disponibles para asuntos urgentes fuera del horario de oficina, no estamos disponibles las 24 horas del da, los 7 das de la semana.   Si tiene un problema urgente y no puede comunicarse con nosotros, puede optar por buscar atencin mdica  en el consultorio de su doctor(a), en una clnica privada, en un centro de atencin urgente o en una sala de emergencias.  Si tiene una emergencia mdica, por favor llame inmediatamente al 911 o vaya a la sala de emergencias.  Nmeros de bper  - Dr. Kowalski: [phone]  - Dra. Moye: [phone]  - Dra. Stewart: [phone]  En caso de inclemencias del tiempo, por favor llame a nuestra lnea principal al [phone] para una actualizacin sobre el estado de cualquier retraso o cierre.  Consejos para la medicacin en dermatologa: Por favor, guarde las cajas en las que vienen los medicamentos de uso tpico para ayudarle a seguir las instrucciones sobre dnde y cmo usarlos. Las farmacias generalmente imprimen las instrucciones del medicamento slo en las cajas y no directamente en los tubos del medicamento.   Si su medicamento es muy caro, por favor, pngase en contacto con nuestra oficina llamando al [phone] y presione la opcin 4 o envenos un mensaje a travs de MyChart.   No podemos decirle cul ser su copago por los medicamentos por adelantado ya que esto es diferente dependiendo de la cobertura de su seguro.  Sin embargo, es posible que podamos encontrar un medicamento sustituto a menor costo o llenar un formulario para que el seguro cubra el medicamento que se considera necesario.   Si se requiere una autorizacin previa para que su compaa de seguros cubra su medicamento, por favor permtanos de 1 a 2 das hbiles para completar este proceso.  Los precios de los medicamentos varan con frecuencia dependiendo del lugar de dnde se surte la receta y alguna farmacias pueden ofrecer precios ms baratos.  El sitio web www.goodrx.com tiene cupones para medicamentos de diferentes farmacias. Los precios aqu no tienen en cuenta lo que podra costar con la ayuda del seguro (puede ser ms barato con su seguro), pero el sitio web puede darle el precio si no utiliz ningn seguro.  - Puede imprimir el cupn correspondiente y llevarlo con su receta a la farmacia.  - Tambin puede pasar por nuestra oficina durante el horario de atencin regular y recoger una tarjeta de cupones de GoodRx.  -   Si necesita que su receta se enve electrnicamente a una farmacia diferente, informe a nuestra oficina a travs de MyChart de [place] o por telfono llamando al [phone] y presione la opcin 4.  

## 2022-05-11 NOTE — Progress Notes (Signed)
Follow-Up Visit   Subjective  Lori Morales is a 61 y.o. female who presents for the following: Annual Exam (History of SCC - The patient presents for Total-Body Skin Exam (TBSE) for skin cancer screening and mole check.  The patient has spots, moles and lesions to be evaluated, some may be new or changing and the patient has concerns that these could be cancer./).  The following portions of the chart were reviewed this encounter and updated as appropriate:   Tobacco  Allergies  Meds  Problems  Med Hx  Surg Hx  Fam Hx     Review of Systems:  No other skin or systemic complaints except as noted in HPI or Assessment and Plan.  Objective  Well appearing patient in no apparent distress; mood and affect are within normal limits.  A full examination was performed including scalp, head, eyes, ears, nose, lips, neck, chest, axillae, abdomen, back, buttocks, bilateral upper extremities, bilateral lower extremities, hands, feet, fingers, toes, fingernails, and toenails. All findings within normal limits unless otherwise noted below.  Left Flank Erythematous stuck-on, waxy papule or plaque  Left lat infraclavicular 1.5 mm dark macule        Assessment & Plan   Lentigines - Scattered tan macules - Due to sun exposure - Benign-appearing, observe - Recommend daily broad spectrum sunscreen SPF 30+ to sun-exposed areas, reapply every 2 hours as needed. - Call for any changes  Seborrheic Keratoses - Stuck-on, waxy, tan-brown papules and/or plaques  - Benign-appearing - Discussed benign etiology and prognosis. - Observe - Call for any changes  Melanocytic Nevi - Tan-brown and/or pink-flesh-colored symmetric macules and papules - Benign appearing on exam today - Observation - Call clinic for new or changing moles - Recommend daily use of broad spectrum spf 30+ sunscreen to sun-exposed areas.   Hemangiomas - Red papules - Discussed benign nature - Observe - Call for any  changes  Actinic Damage - Chronic condition, secondary to cumulative UV/sun exposure - diffuse scaly erythematous macules with underlying dyspigmentation - Recommend daily broad spectrum sunscreen SPF 30+ to sun-exposed areas, reapply every 2 hours as needed.  - Staying in the shade or wearing long sleeves, sun glasses (UVA+UVB protection) and wide brim hats (4-inch brim around the entire circumference of the hat) are also recommended for sun protection.  - Call for new or changing lesions.  Skin cancer screening performed today.  Inflamed seborrheic keratosis Left Flank Symptomatic, irritating, patient would like treated.  Destruction of lesion - Left Flank Complexity: simple   Destruction method: cryotherapy   Informed consent: discussed and consent obtained   Timeout:  patient name, date of birth, surgical site, and procedure verified Lesion destroyed using liquid nitrogen: Yes   Region frozen until ice ball extended beyond lesion: Yes   Outcome: patient tolerated procedure well with no complications   Post-procedure details: wound care instructions given    Neoplasm of uncertain behavior of skin Left lat infraclavicular Epidermal / dermal shaving  Lesion diameter (cm):  0.2 Informed consent: discussed and consent obtained   Timeout: patient name, date of birth, surgical site, and procedure verified   Procedure prep:  Patient was prepped and draped in usual sterile fashion Prep type:  Isopropyl alcohol Anesthesia: the lesion was anesthetized in a standard fashion   Anesthetic:  1% lidocaine w/ epinephrine 1-100,000 buffered w/ 8.4% NaHCO3 Instrument used: flexible razor blade   Hemostasis achieved with: pressure, aluminum chloride and electrodesiccation   Outcome: patient tolerated procedure well  Post-procedure details: sterile dressing applied and wound care instructions given   Dressing type: bandage and petrolatum    Specimen 1 - Surgical pathology Differential  Diagnosis: Hemangioma vs other  Check Margins: No  Return in about 1 year (around 05/12/2023) for TBSE.  I, Ashok Cordia, CMA, am acting as scribe for Sarina Ser, MD . Documentation: I have reviewed the above documentation for accuracy and completeness, and I agree with the above.  Sarina Ser, MD

## 2022-05-12 ENCOUNTER — Ambulatory Visit: Payer: 59 | Admitting: Physical Therapy

## 2022-05-12 DIAGNOSIS — M533 Sacrococcygeal disorders, not elsewhere classified: Secondary | ICD-10-CM | POA: Diagnosis not present

## 2022-05-12 DIAGNOSIS — M542 Cervicalgia: Secondary | ICD-10-CM

## 2022-05-12 DIAGNOSIS — R2689 Other abnormalities of gait and mobility: Secondary | ICD-10-CM | POA: Diagnosis not present

## 2022-05-12 DIAGNOSIS — R278 Other lack of coordination: Secondary | ICD-10-CM | POA: Diagnosis not present

## 2022-05-12 DIAGNOSIS — R293 Abnormal posture: Secondary | ICD-10-CM

## 2022-05-12 DIAGNOSIS — M6281 Muscle weakness (generalized): Secondary | ICD-10-CM | POA: Diagnosis not present

## 2022-05-12 NOTE — Patient Instructions (Signed)
Minisquat: Scoot buttocks back slight, hinge like you are looking at your reflection on a pond  Knees behind toes,  Inhale to "smell flowers"  Exhale on the rise "like rocket"  Do not lock knees, have more weight across ballmounds of feet, toes relaxed   THEN HALF step on both feet first lap with left foot leading down a hall way = 1 lap  Repeated with other foot leading =1 lap  2 laps each side  with green band behind shoulders  After squat to stand, then shoulders down chin tuck, pull green band like "w" and apart from midline like wings   __  Seated, thumbs out holding band, elbow by ribs, feet firm, elbow by ribs, shoulders drop chin tuck  Pull on exhale 15 reps   __ Deep core level 1-2  ( daily)

## 2022-05-12 NOTE — Therapy (Signed)
OUTPATIENT PHYSICAL THERAPY TREATMENT    Patient Name: TAKEYLA MILLION MRN: [id number] DOB:09/10/1960, 61 y.o., female Today's Date: 05/12/2022.pten   PT End of Session - 05/12/22 0823     Visit Number 7    Number of Visits 10    Date for PT Re-Evaluation 05/24/22    PT Start Time 0800    PT Stop Time 0845    PT Time Calculation (min) 45 min    Activity Tolerance Patient tolerated treatment well;No increased pain    Behavior During Therapy WFL for tasks assessed/performed              Past Medical History:  Diagnosis Date   Arthritis    CAD (coronary artery disease), native coronary artery    mild non calcified plaque in mid LAD and prox LCx on coronary CTA 01/2019   Depression    GERD (gastroesophageal reflux disease)    Headache    migraines   Hx of squamous cell carcinoma 07/11/2016   Right distal dorsum lat forearm    Hypertension    IBS (irritable bowel syndrome)    Palpitations    PFO (patent foramen ovale)    small by cardiac CTA 01/2019   PONV (postoperative nausea and vomiting)    Restless legs syndrome    Squamous cell carcinoma of skin 12/202/2017   right distal dorsum lateral forearm   Past Surgical History:  Procedure Laterality Date   ABDOMINOPLASTY     BACK SURGERY     1 laminectomy, 1 fusion   COLONOSCOPY     COLONOSCOPY WITH PROPOFOL N/A 01/17/2017   Procedure: COLONOSCOPY WITH PROPOFOL;  Surgeon: Lollie Sails, MD;  Location: [facility] ENDOSCOPY;  Service: Endoscopy;  Laterality: N/A;   WISDOM TOOTH EXTRACTION     Patient Active Problem List   Diagnosis Date Noted   PFO (patent foramen ovale)    CAD (coronary artery disease), native coronary artery    Acute CVA (cerebrovascular accident) ([place]) 06/15/2018   Depression 10/16/2017   Migraine 10/16/2017   Hyperglycemia, unspecified 07/10/2017   Hyperglycemia 07/10/2017   H/O dizziness 08/02/2016   Cervical disc disorder with radiculopathy of cervical region 04/12/2016   Nonallopathic  lesion of thoracic region 04/12/2016   Nonallopathic lesion of rib cage 04/12/2016   Facet arthritis of cervical region 03/21/2016   Hyperlipidemia 01/26/2015   Labral tear of shoulder, right, subsequent encounter 12/04/2014   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   Impingement syndrome of right shoulder 05/28/2014   Right supraspinatus tenosynovitis 05/28/2014   Arthritis pain 04/03/2014   Fatigue 04/03/2014   Cervical intraepithelial neoplasia grade 1 06/20/2012   IBS (irritable bowel syndrome) 06/20/2012   Lumbar disc disease 06/20/2012    PCP: Caryl Comes  REFERRING PROVIDER: Buscher , NP  REFERRING DIAG: N81.10 (ICD-10-CM) - Cystocele, unspecified   Rationale for Evaluation and Treatment Rehabilitation  THERAPY DIAG:  Sacrococcygeal disorders, not elsewhere classified  Other abnormalities of gait and mobility  Other lack of coordination  Muscle weakness (generalized)  Cervicalgia  Abnormal posture  ONSET DATE:  03/09/22   SUBJECTIVE:               SUBJECTIVE STATEMENT on today  Pt reports her numbness in R hand across 2-3 x instead of 4 x per week.  Only once woke with it with numbness in her hand which is a lot better compared to 4-5 x.    Pt reported last week her LBP was a 2/10, this week 4/10 pain at the L/S junction.  This is the first time she is having back pain in 10 years. Pt first noticed the back pain returning with the prolapse.                                                                                                                                      The locust exercise triggered more neck pain so she stopped doing that one.   SUBJECTIVE STATEMENT on EVAL 03/17/22 :  1) Prolapse:   Pt noticed her prolapse when taking a bath. Her gynecologist confirmed a cystocele. Pt has noticed the pressure , mild cramping in low ab area, fullness, and more frequent trips to the bathroom. Pt tries to not push.  Urinary frequency from 4 x / 2hr.    Difficulty with completing  urination/ emptying.  Denied SUI, UI. Daily bowel movement with Colace, Type 4 consistency  2) neck pain: with L shoulder and both scapula which worsens as the day goes on . Pt also experiences R tingling sensation along the neck when turning R. Since her epidural, the tingling has improved in her fingers.  See cervical surgeries in Pertinent Hx.  Fitness routine: walking 30 min but in the last 2-3 months , pt has not been as active with neck pain.    Pt used to weight lift, crunches.      PERTINENT HISTORY:   partial abdominoplasty umbilical hernia  back surgery laminectomyL5 and  fusion of L4-S1  neck surgery C6-7 arthoplasty  Arthritis C2-C6  pending neck surgery 06/14/22 for C6-7    PAIN:  Are you having pain? No  PRECAUTIONS: None  WEIGHT BEARING RESTRICTIONS: No  FALLS:  Has patient fallen in last 6 months? 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 mini squat alignment with less lumbar lordosis, cued for upper trap depression, cued for deep core level 2      Exercises   Other Exercises  see pt instructions              HOME EXERCISE PROGRAM: See pt instruction section    ASSESSMENT:  CLINICAL IMPRESSION:   Advanced pt  with resistance bands for cervicoscapular stabilization to promote more opportunities for strengthening  and improving posture and alignment.   Required excessive cues for stabilization scapular mm and decrease overuse of upper trap.  Progressed with deep core level 2 which pt required cues for technique.    Pt's pressure sensation is better but given pt's Hx of episiotomy, plan to assess pelvic floor at upcoming sessions to ensure pt is able to be aware of pelvic floor coordination with deep core mm.  Pt benefits from skilled PT.    OBJECTIVE IMPAIRMENTS decreased activity tolerance, decreased coordination, decreased endurance, decreased mobility, difficulty walking, decreased ROM, decreased strength, decreased safety awareness, hypomobility, increased muscle spasms, impaired flexibility, improper body mechanics, postural dysfunction, and pain. scar restrictions   ACTIVITY LIMITATIONS  self-care, sleep, home chores, work tasks    PARTICIPATION LIMITATIONS:  community, gym activities    [place]   scoliosis, past surgeries , standing at work   are also affecting patient's functional outcome.    REHAB POTENTIAL: Good   CLINICAL DECISION MAKING: Evolving/moderate complexity   EVALUATION COMPLEXITY: Moderate    PATIENT EDUCATION:    Education details: Showed pt anatomy images. Explained muscles attachments/ connection, physiology of deep core system/ spinal- thoracic-pelvis-lower kinetic chain as they relate to pt's presentation, Sx, and past Hx. Explained what and how these areas of deficits need to be restored to balance and function    See Therapeutic activity / neuromuscular re-education section  Answered pt's questions.   Person educated: Patient Education method: Explanation, Demonstration, Tactile cues, Verbal cues, and Handouts Education comprehension: verbalized understanding, returned demonstration, verbal cues required, tactile cues required, and needs further education     PLAN: PT FREQUENCY: 1x/week   PT DURATION: 10 weeks   PLANNED INTERVENTIONS: Therapeutic  exercises, Therapeutic activity, Neuromuscular re-education, Balance training, Gait training, Patient/Family education, Self Care, Joint mobilization, Spinal mobilization, Moist heat, Taping, and Manual therapy.   PLAN FOR NEXT SESSION: See clinical impression for plan     GOALS: Goals reviewed with patient? Yes  SHORT TERM GOALS: Target date: 05/26/2022    Pt will demo IND with HEP                    Baseline: Not IND            Goal status: INITIAL   LONG TERM GOALS: Target date: 05/26/2022    1.Pt will demo proper deep core coordination without chest breathing and optimal excursion of diaphragm/pelvic floor in order to promote spinal stability and pelvic floor function  Baseline: dyscoordination Goal status: INITIAL  2.  Pt will demo > 5 pt change on FOTO  to improve QOL and function   Pelvic Pain baseline -13 pts  PFDI Urinary baseline -4 pts   Urinary Problem baseline-63 pts    Goal status: INITIAL  3.  Pt will demo proper body mechanics in against gravity tasks and ADLs  work tasks, fitness  to minimize straining pelvic floor / back                  Baseline: not IND, improper form that places strain on pelvic floor                Goal status: INITIAL    4. Pt will demo levelled pelvic girdle and shoulder height in order to progress to deep core strengthening HEP and restore mobility at spine, pelvis, gait, posture   Baseline: R  iliac crest / L  shoulder higher  Goal status:  MET     5. Pt will demo report complete urination across 75% of the time in order to minimize risk for UTIs  Baseline: Difficulty with completing urination.  Goal status: INITIAL   6.  Pt will report decreased urinary frequency to < 2 x / 2 hours in order to participate in community activities. Baseline:  Urinary frequency from 4 x / 2hr.  Goal status: INITIAL                           7. Pt will increase her cervical endurance test time to > 90 sec to minimize forward head  posture and optimize IAP system for postural stability, pelvic function, and less neck pain  Baseline:  45 sec              Goal status:  Initial   Jerl Mina, PT 05/12/2022, 9:39 AM

## 2022-05-18 ENCOUNTER — Ambulatory Visit
Admission: RE | Admit: 2022-05-18 | Discharge: 2022-05-18 | Disposition: A | Discharge status: Home / Self Care | Payer: 59 | Source: Ambulatory Visit | Attending: Obstetrics and Gynecology | Admitting: Obstetrics and Gynecology

## 2022-05-18 ENCOUNTER — Other Ambulatory Visit: Payer: Self-pay

## 2022-05-18 DIAGNOSIS — Z1231 Encounter for screening mammogram for malignant neoplasm of breast: Secondary | ICD-10-CM | POA: Insufficient documentation

## 2022-05-18 MED ORDER — PANTOPRAZOLE SODIUM 40 MG PO TBEC
40.0000 mg | DELAYED_RELEASE_TABLET | Freq: Every day | ORAL | 3 refills | Status: DC
  Filled 2022-05-18: qty 90, 90d supply, fill #0
  Filled 2022-08-01: qty 90, 90d supply, fill #1
  Filled 2022-10-27: qty 90, 90d supply, fill #2

## 2022-05-18 MED ORDER — PRAMIPEXOLE DIHYDROCHLORIDE 0.25 MG PO TABS
0.2500 mg | ORAL_TABLET | Freq: Every day | ORAL | 3 refills | Status: DC
  Filled 2022-05-18: qty 90, 90d supply, fill #0
  Filled 2022-08-01: qty 90, 90d supply, fill #1
  Filled 2022-10-27: qty 90, 90d supply, fill #2

## 2022-05-18 MED ORDER — PRAVASTATIN SODIUM 40 MG PO TABS
40.0000 mg | ORAL_TABLET | Freq: Every day | ORAL | 3 refills | Status: DC
  Filled 2022-05-18: qty 90, 90d supply, fill #0
  Filled 2022-08-01: qty 90, 90d supply, fill #1
  Filled 2022-10-27: qty 90, 90d supply, fill #2
  Filled 2023-01-30 – 2023-02-26 (×2): qty 90, 90d supply, fill #3

## 2022-05-19 ENCOUNTER — Ambulatory Visit: Payer: 59 | Admitting: Physical Therapy

## 2022-05-20 DIAGNOSIS — M5412 Radiculopathy, cervical region: Secondary | ICD-10-CM | POA: Diagnosis not present

## 2022-05-20 DIAGNOSIS — M4722 Other spondylosis with radiculopathy, cervical region: Secondary | ICD-10-CM | POA: Diagnosis not present

## 2022-05-20 DIAGNOSIS — M4802 Spinal stenosis, cervical region: Secondary | ICD-10-CM | POA: Diagnosis not present

## 2022-05-20 DIAGNOSIS — R7303 Prediabetes: Secondary | ICD-10-CM | POA: Diagnosis not present

## 2022-05-20 DIAGNOSIS — F3289 Other specified depressive episodes: Secondary | ICD-10-CM | POA: Diagnosis not present

## 2022-05-20 DIAGNOSIS — M1990 Unspecified osteoarthritis, unspecified site: Secondary | ICD-10-CM | POA: Diagnosis not present

## 2022-05-20 DIAGNOSIS — E7849 Other hyperlipidemia: Secondary | ICD-10-CM | POA: Diagnosis not present

## 2022-05-20 DIAGNOSIS — I10 Essential (primary) hypertension: Secondary | ICD-10-CM | POA: Diagnosis not present

## 2022-05-20 DIAGNOSIS — Z8673 Personal history of transient ischemic attack (TIA), and cerebral infarction without residual deficits: Secondary | ICD-10-CM | POA: Diagnosis not present

## 2022-05-20 DIAGNOSIS — Z01818 Encounter for other preprocedural examination: Secondary | ICD-10-CM | POA: Diagnosis not present

## 2022-05-20 DIAGNOSIS — I2510 Atherosclerotic heart disease of native coronary artery without angina pectoris: Secondary | ICD-10-CM | POA: Diagnosis not present

## 2022-05-20 DIAGNOSIS — M519 Unspecified thoracic, thoracolumbar and lumbosacral intervertebral disc disorder: Secondary | ICD-10-CM | POA: Diagnosis not present

## 2022-05-25 ENCOUNTER — Telehealth: Payer: Self-pay

## 2022-05-25 NOTE — Telephone Encounter (Signed)
-----   Message from Ralene Bathe, MD sent at 05/18/2022  3:49 PM EST ----- Diagnosis Skin , left lat infraclavicular [place]  Benign dilated blood vessel No further treatment needed

## 2022-05-26 ENCOUNTER — Encounter: Payer: Self-pay | Admitting: Dermatology

## 2022-05-26 ENCOUNTER — Ambulatory Visit: Payer: 59 | Admitting: Physical Therapy

## 2022-06-01 ENCOUNTER — Other Ambulatory Visit: Payer: Self-pay | Admitting: Nurse Practitioner

## 2022-06-01 DIAGNOSIS — M5412 Radiculopathy, cervical region: Secondary | ICD-10-CM

## 2022-06-01 DIAGNOSIS — M4802 Spinal stenosis, cervical region: Secondary | ICD-10-CM

## 2022-06-02 ENCOUNTER — Ambulatory Visit: Payer: Commercial Managed Care - PPO | Attending: Internal Medicine | Admitting: Physical Therapy

## 2022-06-02 DIAGNOSIS — M533 Sacrococcygeal disorders, not elsewhere classified: Secondary | ICD-10-CM | POA: Diagnosis not present

## 2022-06-02 DIAGNOSIS — R2689 Other abnormalities of gait and mobility: Secondary | ICD-10-CM | POA: Insufficient documentation

## 2022-06-02 DIAGNOSIS — M542 Cervicalgia: Secondary | ICD-10-CM | POA: Insufficient documentation

## 2022-06-02 DIAGNOSIS — R278 Other lack of coordination: Secondary | ICD-10-CM | POA: Insufficient documentation

## 2022-06-02 DIAGNOSIS — M6281 Muscle weakness (generalized): Secondary | ICD-10-CM | POA: Diagnosis not present

## 2022-06-02 NOTE — Patient Instructions (Signed)
Deep core level `1-2 ( pillow under hips)   Less use of abdominal muscle

## 2022-06-02 NOTE — Therapy (Signed)
OUTPATIENT PHYSICAL THERAPY TREATMENT / Recert     Patient Name: Lori Morales MRN: [id number] DOB:[birth year], 62 y.o., female Today's Date: 06/02/2022.pten   PT End of Session - 06/02/22 0807     Visit Number 8    Number of Visits 18    Date for PT Re-Evaluation 08/11/22    PT Start Time 0804    PT Stop Time 0845    PT Time Calculation (min) 41 min    Activity Tolerance Patient tolerated treatment well;No increased pain    Behavior During Therapy WFL for tasks assessed/performed              Past Medical History:  Diagnosis Date   Arthritis    CAD (coronary artery disease), native coronary artery    mild non calcified plaque in mid LAD and prox LCx on coronary CTA 01/2019   Depression    GERD (gastroesophageal reflux disease)    Headache    migraines   Hx of squamous cell carcinoma 07/11/2016   Right distal dorsum lat forearm    Hypertension    IBS (irritable bowel syndrome)    Palpitations    PFO (patent foramen ovale)    small by cardiac CTA 01/2019   PONV (postoperative nausea and vomiting)    Restless legs syndrome    Squamous cell carcinoma of skin 12/202/2017   right distal dorsum lateral forearm   Past Surgical History:  Procedure Laterality Date   ABDOMINOPLASTY     BACK SURGERY     1 laminectomy, 1 fusion   COLONOSCOPY     COLONOSCOPY WITH PROPOFOL N/A 01/17/2017   Procedure: COLONOSCOPY WITH PROPOFOL;  Surgeon: Lollie Sails, MD;  Location: [facility] ENDOSCOPY;  Service: Endoscopy;  Laterality: N/A;   WISDOM TOOTH EXTRACTION     Patient Active Problem List   Diagnosis Date Noted   PFO (patent foramen ovale)    CAD (coronary artery disease), native coronary artery    Acute CVA (cerebrovascular accident) ([place]) 06/15/2018   Depression 10/16/2017   Migraine 10/16/2017   Hyperglycemia, unspecified 07/10/2017   Hyperglycemia 07/10/2017   H/O dizziness 08/02/2016   Cervical disc disorder with radiculopathy of cervical region 04/12/2016    Nonallopathic lesion of thoracic region 04/12/2016   Nonallopathic lesion of rib cage 04/12/2016   Facet arthritis of cervical region 03/21/2016   Hyperlipidemia 01/26/2015   Labral tear of shoulder, right, subsequent encounter 12/04/2014   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   Impingement syndrome of right shoulder 05/28/2014   Right supraspinatus tenosynovitis 05/28/2014   Arthritis pain 04/03/2014   Fatigue 04/03/2014   Cervical intraepithelial neoplasia grade 1 06/20/2012   IBS (irritable bowel syndrome) 06/20/2012   Lumbar disc disease 06/20/2012    PCP: Caryl Comes  REFERRING PROVIDER: Buscher , NP  REFERRING DIAG: N81.10 (ICD-10-CM) - Cystocele, unspecified   Rationale for Evaluation and Treatment Rehabilitation  THERAPY DIAG:  Sacrococcygeal disorders, not elsewhere classified  Other abnormalities of gait and mobility  Other lack of coordination  Muscle weakness (generalized)  Cervicalgia  ONSET DATE:  03/09/22   SUBJECTIVE:               SUBJECTIVE STATEMENT on today  Pt reports her numbness in R hand and prolapse pressure increased last week with more activities and holding grandbaby  SUBJECTIVE STATEMENT on EVAL 03/17/22 :  1) Prolapse:   Pt noticed her prolapse when taking a bath. Her gynecologist confirmed a cystocele. Pt has noticed the pressure , mild cramping in low ab area,  fullness, and more frequent trips to the bathroom. Pt tries to not push.  Urinary frequency from 4 x / 2hr.    Difficulty with completing urination/ emptying.  Denied SUI, UI. Daily bowel movement with Colace, Type 4 consistency  2) neck pain: with L shoulder and both scapula which worsens as the day goes on . Pt also experiences R tingling sensation along the neck when turning R. Since her epidural, the tingling has improved in her fingers.  See cervical surgeries in Pertinent Hx.  Fitness routine: walking 30 min but in the last 2-3 months , pt has not been as active with neck pain.    Pt used to  weight lift, crunches.     PERTINENT HISTORY:   partial abdominoplasty umbilical hernia  back surgery laminectomyL5 and  fusion of L4-S1  neck surgery C6-7 arthoplasty  Arthritis C2-C6  pending neck surgery 06/14/22 for C6-7    PAIN:  Are you having pain? No  PRECAUTIONS: None  WEIGHT BEARING RESTRICTIONS: No  FALLS:  Has patient fallen in last 6 months? 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%   PLOF: Independent  PATIENT GOALS:   Keep the prolapse from worsening    OBJECTIVE:    Pelvic Floor Special Questions - 06/02/22 1158     Prolapse Anterior Wall;Posterior Wall;Other   inside introitus   Prolapse other standing: dyscoordination when cued for contraction,    Pelvic Floor Internal Exam pt consented verbally without contraindications    Exam Type Vaginal    Palpation tightness along 5-7 o'clock 1-2 nd layer, dyscoordination  ab overuse, noted lowered   slightly lowered urehtra/ bladder, posterior wall            [facility] Adult PT Treatment/Exercise - 06/02/22 1149       Neuro Re-ed    Neuro Re-ed Details  cued for deep core level 2 with pillow under pelvis to minimize dyscoordination / overuse of ab      Manual Therapy   Internal Pelvic Floor STM/MWM at 1-2 second layer at 5 o'clock to minimize tightness             HOME EXERCISE PROGRAM: See pt instruction section    ASSESSMENT:  CLINICAL IMPRESSION:  Pt met 3 goals and partially 3 goals and still working on remaining goals.  Improvements :  Pelvic: Prolapse pressure sensation has improved but it got worse with picking up grandbaby       Able to completely urinate  Urinary frequency decreased    R hand:  Her numbness in R hand across 2-3 x instead of 4 x per week.  Only once woke with it with numbness in her hand which is a lot better compared to 4-5 x.   Pt reported  last week her LBP was a 2/10, this week 4/10 pain at the L/S junction.  Increased cervical endurance strength but still requires more strengthening as pre-hab to her neck surgery    Progressed with deep core level 2 which pt required cues for technique to not overuse her abdominal mm which contributes to downward movement of pelvic organs associated with prolapse Sx. Pelvic floor assessment today showed this dyscoordination and slightly lowered pelvic organs. Pt will require manual Tx to minimize tightness of mm to elicit proper lengthening and coordination before advancing to kegels.    Pt benefits from skilled  PT.    OBJECTIVE IMPAIRMENTS decreased activity tolerance, decreased coordination, decreased endurance, decreased mobility, difficulty walking, decreased ROM, decreased strength, decreased safety awareness, hypomobility, increased muscle spasms, impaired flexibility, improper body mechanics, postural dysfunction, and pain. scar restrictions   ACTIVITY LIMITATIONS  self-care, sleep, home chores, work tasks    PARTICIPATION LIMITATIONS:  community, gym activities    [place]   scoliosis, past surgeries , standing at work   are also affecting patient's functional outcome.    REHAB POTENTIAL: Good   CLINICAL DECISION MAKING: Evolving/moderate complexity   EVALUATION COMPLEXITY: Moderate    PATIENT EDUCATION:    Education details: Showed pt anatomy images. Explained muscles attachments/ connection, physiology of deep core system/ spinal- thoracic-pelvis-lower kinetic chain as they relate to pt's presentation, Sx, and past Hx. Explained what and how these areas of deficits need to be restored to balance and function    See Therapeutic activity / neuromuscular re-education section  Answered pt's questions.   Person educated: Patient Education method: Explanation, Demonstration, Tactile cues, Verbal cues, and Handouts Education comprehension: verbalized understanding,  returned demonstration, verbal cues required, tactile cues required, and needs further education     PLAN: PT FREQUENCY: 1x/week   PT DURATION: 10 weeks   PLANNED INTERVENTIONS: Therapeutic exercises, Therapeutic activity, Neuromuscular re-education, Balance training, Gait training, Patient/Family education, Self Care, Joint mobilization, Spinal mobilization, Moist heat, Taping, and Manual therapy.   PLAN FOR NEXT SESSION: See clinical impression for plan     GOALS: Goals reviewed with patient? Yes  SHORT TERM GOALS: Target date: 05/26/2022    Pt will demo IND with HEP                    Baseline: Not IND            Goal status: MET  LONG TERM GOALS: Target date: 05/26/2022    1.Pt will demo proper deep core coordination without chest breathing and optimal excursion of diaphragm/pelvic floor in order to promote spinal stability and pelvic floor function  Baseline: dyscoordination Goal status: Partially MET   2.  Pt will demo > 5 pt change on FOTO  to improve QOL and function   Pelvic Pain baseline -13 pts  PFDI Urinary baseline -4 pts   Urinary Problem baseline-63 pts    Goal status: Need to administer again  3.  Pt will demo proper body mechanics in against gravity tasks and ADLs  work tasks, fitness  to minimize straining pelvic floor / back                  Baseline: not IND, improper form that places strain on pelvic floor                Goal status: MET     4. Pt will demo levelled pelvic girdle and shoulder height in order to progress to deep core strengthening HEP and restore mobility at spine, pelvis, gait, posture   Baseline: R  iliac crest / L shoulder higher  Goal status:  MET     5. Pt will demo report complete urination across 75% of the time in order to minimize risk for UTIs  Baseline: Difficulty with completing urination.  ( 06/02/21: complete urination 90% of the time)  Goal status: MET    6.  Pt will report decreased urinary frequency to <  2 x / 2 hours in order to participate in community activities. Baseline:  Urinary frequency from 4 x / 2hr.  Goal status: On going                            7. Pt will increase her cervical endurance test time to > 90 sec to minimize forward head posture and optimize IAP system for postural stability, pelvic function, and less neck pain  Baseline:  45 sec  (06/02/22:  69 sec)               Goal status:  Partially Met   Jerl Mina, PT 06/02/2022, 8:14 AM

## 2022-06-03 ENCOUNTER — Other Ambulatory Visit: Payer: Self-pay

## 2022-06-03 ENCOUNTER — Ambulatory Visit
Admission: RE | Admit: 2022-06-03 | Discharge: 2022-06-03 | Disposition: A | Discharge status: Home / Self Care | Payer: Commercial Managed Care - PPO | Source: Ambulatory Visit | Attending: Nurse Practitioner | Admitting: Nurse Practitioner

## 2022-06-03 DIAGNOSIS — M5412 Radiculopathy, cervical region: Secondary | ICD-10-CM | POA: Insufficient documentation

## 2022-06-03 DIAGNOSIS — M4312 Spondylolisthesis, cervical region: Secondary | ICD-10-CM | POA: Diagnosis not present

## 2022-06-03 DIAGNOSIS — M4802 Spinal stenosis, cervical region: Secondary | ICD-10-CM | POA: Diagnosis not present

## 2022-06-06 ENCOUNTER — Other Ambulatory Visit: Payer: Self-pay

## 2022-06-06 MED ORDER — CELECOXIB 200 MG PO CAPS
200.0000 mg | ORAL_CAPSULE | Freq: Two times a day (BID) | ORAL | 3 refills | Status: DC | PRN
  Filled 2022-06-06: qty 180, 90d supply, fill #0
  Filled 2023-01-30 – 2023-02-26 (×2): qty 180, 90d supply, fill #1

## 2022-06-06 MED ORDER — ZOLPIDEM TARTRATE ER 6.25 MG PO TBCR
6.2500 mg | EXTENDED_RELEASE_TABLET | Freq: Every evening | ORAL | 5 refills | Status: DC | PRN
  Filled 2022-06-07: qty 30, 30d supply, fill #0
  Filled 2022-09-18: qty 30, 30d supply, fill #1
  Filled 2022-10-27: qty 30, 30d supply, fill #2

## 2022-06-06 MED ORDER — LOSARTAN POTASSIUM 25 MG PO TABS
25.0000 mg | ORAL_TABLET | Freq: Every day | ORAL | 3 refills | Status: DC
  Filled 2022-06-06: qty 90, 90d supply, fill #0

## 2022-06-07 ENCOUNTER — Other Ambulatory Visit: Payer: Self-pay

## 2022-06-07 ENCOUNTER — Telehealth: Payer: Self-pay

## 2022-06-07 NOTE — Telephone Encounter (Signed)
-----   Message from Ralene Bathe, MD sent at 05/18/2022  3:49 PM EST ----- Diagnosis Skin , left lat infraclavicular [place]  Benign dilated blood vessel No further treatment needed

## 2022-06-07 NOTE — Telephone Encounter (Signed)
Patient advised pathology showed benign dilated blood vessel. Lurlean Horns., RMA

## 2022-06-08 ENCOUNTER — Other Ambulatory Visit: Payer: Self-pay

## 2022-06-08 MED ORDER — OSELTAMIVIR PHOSPHATE 75 MG PO CAPS
ORAL_CAPSULE | ORAL | 0 refills | Status: DC
  Filled 2022-06-08: qty 10, 10d supply, fill #0

## 2022-06-09 ENCOUNTER — Ambulatory Visit: Payer: Commercial Managed Care - PPO | Admitting: Physical Therapy

## 2022-06-14 DIAGNOSIS — E785 Hyperlipidemia, unspecified: Secondary | ICD-10-CM | POA: Diagnosis not present

## 2022-06-14 DIAGNOSIS — M5412 Radiculopathy, cervical region: Secondary | ICD-10-CM | POA: Diagnosis not present

## 2022-06-14 DIAGNOSIS — I10 Essential (primary) hypertension: Secondary | ICD-10-CM | POA: Diagnosis not present

## 2022-06-14 DIAGNOSIS — Z7982 Long term (current) use of aspirin: Secondary | ICD-10-CM | POA: Diagnosis not present

## 2022-06-14 DIAGNOSIS — F32A Depression, unspecified: Secondary | ICD-10-CM | POA: Diagnosis not present

## 2022-06-14 DIAGNOSIS — Z8673 Personal history of transient ischemic attack (TIA), and cerebral infarction without residual deficits: Secondary | ICD-10-CM | POA: Diagnosis not present

## 2022-06-14 DIAGNOSIS — G2581 Restless legs syndrome: Secondary | ICD-10-CM | POA: Diagnosis not present

## 2022-06-14 DIAGNOSIS — F419 Anxiety disorder, unspecified: Secondary | ICD-10-CM | POA: Diagnosis not present

## 2022-06-14 DIAGNOSIS — I2510 Atherosclerotic heart disease of native coronary artery without angina pectoris: Secondary | ICD-10-CM | POA: Diagnosis not present

## 2022-06-14 DIAGNOSIS — M4722 Other spondylosis with radiculopathy, cervical region: Secondary | ICD-10-CM | POA: Diagnosis not present

## 2022-06-14 DIAGNOSIS — K219 Gastro-esophageal reflux disease without esophagitis: Secondary | ICD-10-CM | POA: Diagnosis not present

## 2022-06-16 ENCOUNTER — Other Ambulatory Visit: Payer: Self-pay

## 2022-06-16 MED ORDER — METHYLPREDNISOLONE 4 MG PO TBPK
ORAL_TABLET | ORAL | 0 refills | Status: DC
  Filled 2022-06-16: qty 21, 6d supply, fill #0

## 2022-06-22 ENCOUNTER — Other Ambulatory Visit: Payer: Self-pay

## 2022-07-13 DIAGNOSIS — M5412 Radiculopathy, cervical region: Secondary | ICD-10-CM | POA: Diagnosis not present

## 2022-08-01 ENCOUNTER — Other Ambulatory Visit: Payer: Self-pay

## 2022-08-10 DIAGNOSIS — M5412 Radiculopathy, cervical region: Secondary | ICD-10-CM | POA: Diagnosis not present

## 2022-08-11 ENCOUNTER — Other Ambulatory Visit: Payer: Self-pay

## 2022-08-23 NOTE — Therapy (Signed)
OUTPATIENT PHYSICAL THERAPY CERVICAL EVALUATION   Patient Name: Lori Morales MRN: [id number] DOB:1961/04/07, 62 y.o., female Today's Date: 08/24/2022  END OF SESSION:  PT End of Session - 08/24/22 1254     Visit Number 1    Number of Visits 16    Date for PT Re-Evaluation 10/19/22    PT Start Time 0800    PT Stop Time 0844    PT Time Calculation (min) 44 min    Activity Tolerance Patient tolerated treatment well;Patient limited by pain    Behavior During Therapy [facility] for tasks assessed/performed             Past Medical History:  Diagnosis Date   Arthritis    CAD (coronary artery disease), native coronary artery    mild non calcified plaque in mid LAD and prox LCx on coronary CTA 01/2019   Depression    GERD (gastroesophageal reflux disease)    Headache    migraines   Hx of squamous cell carcinoma 07/11/2016   Right distal dorsum lat forearm    Hypertension    IBS (irritable bowel syndrome)    Palpitations    PFO (patent foramen ovale)    small by cardiac CTA 01/2019   PONV (postoperative nausea and vomiting)    Restless legs syndrome    Squamous cell carcinoma of skin 12/202/2017   right distal dorsum lateral forearm   Past Surgical History:  Procedure Laterality Date   ABDOMINOPLASTY     BACK SURGERY     1 laminectomy, 1 fusion   COLONOSCOPY     COLONOSCOPY WITH PROPOFOL N/A 01/17/2017   Procedure: COLONOSCOPY WITH PROPOFOL;  Surgeon: Lollie Sails, MD;  Location: [facility] ENDOSCOPY;  Service: Endoscopy;  Laterality: N/A;   WISDOM TOOTH EXTRACTION     Patient Active Problem List   Diagnosis Date Noted   PFO (patent foramen ovale)    CAD (coronary artery disease), native coronary artery    Acute CVA (cerebrovascular accident) ([place]) 06/15/2018   Depression 10/16/2017   Migraine 10/16/2017   Hyperglycemia, unspecified 07/10/2017   Hyperglycemia 07/10/2017   H/O dizziness 08/02/2016   Cervical disc disorder with radiculopathy of cervical region  04/12/2016   Nonallopathic lesion of thoracic region 04/12/2016   Nonallopathic lesion of rib cage 04/12/2016   Facet arthritis of cervical region 03/21/2016   Hyperlipidemia 01/26/2015   Labral tear of shoulder, right, subsequent encounter 12/04/2014   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   Impingement syndrome of right shoulder 05/28/2014   Right supraspinatus tenosynovitis 05/28/2014   Arthritis pain 04/03/2014   Fatigue 04/03/2014   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 Hand dominance: Right  PERTINENT HISTORY:  Patient is returning to PT for s/p cervical fusion C5-7 with bilateral foraminotomies at C6-7 on 06/14/22. Patient has a history of C6-7 arthroplasty and has been treated by this clinician before. PMH includes anxiety, CAD, depression, arthritis of cervical region, gastritis, GERD, cancer, stroke, HLD, HTN, IB, lumbar disc disease (fusion x2 ), migraine, numbness of L hand, headaches, and restless leg syndrome.  Patient had posterior cervical fusion from C5-C7 and bilateral foraminotomies at C6-7 on 06/14/22; Only restriction is no lifting >15 lb  PAIN:  Are you having pain? Yes: NPRS scale: 3/10 Pain location: lower cervical and across traps Pain description: aching Aggravating factors: positioning Relieving factors:  positioning, heat  PRECAUTIONS: Cervical  WEIGHT BEARING RESTRICTIONS:  no lifting>15 lb   FALLS:  Has patient fallen in last 6 months? No  LIVING ENVIRONMENT: Lives with: lives with their spouse Lives in: House/apartment Stairs: Yes: External:   steps;     OCCUPATION: Surveyor, quantity   PLOF: Independent  PATIENT GOALS: to have more range of motion  NEXT MD VISIT: July 3rd, 2024  OBJECTIVE:   DIAGNOSTIC FINDINGS:  IMPRESSION: 1. Anterior cervical fusion at C6-7. Susceptibility artifact partially obscures evaluation of the neural foramina and central canal. No significant right foraminal stenosis. Left uncovertebral degenerative changes with moderate left foraminal stenosis. 2. At C2-3 there is a minimal broad-based disc bulge. Moderate right facet arthropathy. Mild right foraminal stenosis. 3. At C3-4 there is a minimal broad-based disc bulge. Moderate right facet arthropathy. Mild left foraminal stenosis. 4. At C4-5 there is a minimal broad-based disc bulge. Mild right facet arthropathy. Moderate left foraminal stenosis. 5. At C7-T1 there is a minimal broad-based disc bulge. Mild right and moderate left foraminal stenosis.    IMPRESSION: 1. Anterior cervical fusion at C6-7. Susceptibility artifact partially obscures evaluation of the neural foramina and central canal. No significant right foraminal stenosis. Left uncovertebral degenerative changes with moderate left foraminal stenosis. 2. At C2-3 there is a minimal broad-based disc bulge. Moderate right facet arthropathy. Mild right foraminal stenosis. 3. At C3-4 there is a minimal broad-based disc bulge. Moderate right facet arthropathy. Mild left foraminal stenosis. 4. At C4-5 there is a minimal broad-based disc bulge. Mild right facet arthropathy. Moderate left foraminal stenosis. 5. At C7-T1 there is a minimal broad-based disc bulge. Mild right and moderate left foraminal stenosis.  PATIENT SURVEYS:   NDI 42% FOTO 45  COGNITION: Overall cognitive status: Within functional limits for tasks assessed  SENSATION: Light touch: WFL; occasional R hand numbness with sleep   POSTURE: rounded shoulders  PALPATION: Hardware present, tenderness at C5-6, C6-7.    CERVICAL ROM:   Active ROM A/PROM (deg) eval  Flexion 24  Extension 25  Right lateral flexion 9  Left lateral flexion 19  Right rotation 18  Left rotation 19   (Blank rows = not tested)  UPPER EXTREMITY ROM:  Active ROM Right eval Left eval  Shoulder flexion [facility]  Shoulder extension    Shoulder abduction 170 140   (Blank rows = not tested)  UPPER EXTREMITY MMT:  MMT Right eval Left eval  Shoulder flexion 4+ 4+  Shoulder extension 4+ 4+  Shoulder abduction 4+ 4+  Shoulder adduction 4+ 4+  Shoulder extension 4+ 4+  Elbow flexion 4+ 4+  Elbow extension 4+ 4+   (Blank rows = not tested)  TODAY'S TREATMENT:                                                                                                                              DATE: 08/24/22  Eval +HEP    PATIENT EDUCATION:  Education details: goals, POC, HEP Person educated: Patient Education method: Explanation, Demonstration, Tactile cues, and Verbal cues Education comprehension: verbalized understanding, returned demonstration, verbal cues required, and tactile cues required  HOME EXERCISE PROGRAM: Access Code: [email or web address] URL: [email or web address] Date: 08/24/2022 Prepared by: Janna Arch  Exercises - Supine Cervical Retraction with Towel  - 1 x daily - 7 x weekly - 2 sets - 10 reps - 5 hold - Seated Scapular Retraction  - 1 x daily - 7 x weekly - 2 sets - 10 reps - 5 hold - Supine Cervical Rotation AROM on Pillow  - 1 x daily - 7 x weekly - 2 sets - 10 reps - 5 hold - Neck Sidebending  - 1 x daily - 7 x weekly - 2 sets - 10 reps - 5 hold - Supine Cervical Flexion Extension on Pillow  - 1 x daily - 7 x weekly - 2 sets -  10 reps - 5 hold  ASSESSMENT:  CLINICAL IMPRESSION: Patient is a 62 y.o. female who was seen today for physical therapy evaluation and treatment for cervical fusion C5-7 with bilateral foraminotomies at C6-7 on 06/14/22. Patient's only restriction at this time is no lifting >15 lb. Her ROM is limited significantly as well as shoulder abduction of LUE due to muscle tissue tension. Patient is educated on HEP and demonstrates excellent motivation for progression of care. Patient will benefit from skilled physical therapy to reduce pain,improve ROM, and return to PLOF  OBJECTIVE IMPAIRMENTS: decreased activity tolerance, decreased endurance, decreased ROM, decreased strength, hypomobility, increased muscle spasms, impaired flexibility, improper body mechanics, postural dysfunction, and pain.   ACTIVITY LIMITATIONS: carrying, lifting, sitting, standing, reach over head, and work  PARTICIPATION LIMITATIONS: meal prep, cleaning, laundry, driving, shopping, community activity, occupation, and yard work  PERSONAL FACTORS: Past/current experiences, Environmental consultant, Sex, Time since onset of injury/illness/exacerbation, and 3+ comorbidities: anxiety, CAD, depression, arthritis of cervical region, gastritis, GERD, cancer, stroke, HLD, HTN, IB, lumbar disc disease (fusion x2 ), migraine, numbness of L hand, headaches, and restless leg syndrome  are also affecting patient's functional outcome.   REHAB POTENTIAL: Fair    CLINICAL DECISION MAKING: Evolving/moderate complexity  EVALUATION COMPLEXITY: Moderate   GOALS: Goals reviewed with patient? Yes  SHORT TERM GOALS: Target date: 09/21/2022    Patient will be independent in home exercise program to improve strength/mobility for better functional independence with ADLs. Baseline: 3/27: given Goal status: INITIAL    LONG TERM GOALS: Target date: 10/19/2022    Patient will increase FOTO score to equal to or greater than  55   to demonstrate statistically  significant improvement in mobility and quality of life.  Baseline: 45 Goal status: INITIAL  2.  Patient will reduce Neck Disability Index score to <10% to demonstrate minimal disability with ADL's including improved sleeping tolerance, sitting tolerance, etc for better mobility at home and work. Baseline: 42% Goal status: INITIAL  3.  Patient will improve cervical ROM by 20 degrees flexion, sidebending, and rotation for improved quality of life and ability to drive.  Baseline: 3/27: see above Goal status: INITIAL  4.  Patient will return to full work and recreational activities without compensation or pain increase of >3/10.  Baseline: 3/27: unable to perform  Goal status: INITIAL    PLAN:  PT FREQUENCY: 2x/week  PT DURATION: 8 weeks  PLANNED INTERVENTIONS: Therapeutic exercises, Therapeutic activity, Neuromuscular re-education, Balance training, Gait training, Patient/Family education, Self Care, Joint mobilization, Stair training, Vestibular training, Canalith repositioning, Dry Needling, Spinal mobilization, Cryotherapy, Moist heat, scar mobilization, Traction, Ultrasound, Manual therapy, and Re-evaluation  PLAN FOR NEXT SESSION: upper trap release, cervical ROM increase, postural strengthening.    Janna Arch, PT 08/24/2022, 1:12 PM

## 2022-08-24 ENCOUNTER — Ambulatory Visit: Payer: Commercial Managed Care - PPO | Attending: Orthopedic Surgery

## 2022-08-24 DIAGNOSIS — M542 Cervicalgia: Secondary | ICD-10-CM | POA: Diagnosis not present

## 2022-08-24 DIAGNOSIS — R293 Abnormal posture: Secondary | ICD-10-CM | POA: Diagnosis not present

## 2022-08-24 DIAGNOSIS — R7303 Prediabetes: Secondary | ICD-10-CM | POA: Diagnosis not present

## 2022-08-24 DIAGNOSIS — E7849 Other hyperlipidemia: Secondary | ICD-10-CM | POA: Diagnosis not present

## 2022-08-25 NOTE — Therapy (Signed)
OUTPATIENT PHYSICAL THERAPY CERVICAL TREATMENT   Patient Name: Lori Morales MRN: [id number] DOB:24-May-1961, 62 y.o., female Today's Date: 08/29/2022  END OF SESSION:  PT End of Session - 08/29/22 0847     Visit Number 2    Number of Visits 16    Date for PT Re-Evaluation 10/19/22    PT Start Time 0845    PT Stop Time 0929    PT Time Calculation (min) 44 min    Activity Tolerance Patient tolerated treatment well;Patient limited by pain    Behavior During Therapy [facility] for tasks assessed/performed              Past Medical History:  Diagnosis Date   Arthritis    CAD (coronary artery disease), native coronary artery    mild non calcified plaque in mid LAD and prox LCx on coronary CTA 01/2019   Depression    GERD (gastroesophageal reflux disease)    Headache    migraines   Hx of squamous cell carcinoma 07/11/2016   Right distal dorsum lat forearm    Hypertension    IBS (irritable bowel syndrome)    Palpitations    PFO (patent foramen ovale)    small by cardiac CTA 01/2019   PONV (postoperative nausea and vomiting)    Restless legs syndrome    Squamous cell carcinoma of skin 12/202/2017   right distal dorsum lateral forearm   Past Surgical History:  Procedure Laterality Date   ABDOMINOPLASTY     BACK SURGERY     1 laminectomy, 1 fusion   COLONOSCOPY     COLONOSCOPY WITH PROPOFOL N/A 01/17/2017   Procedure: COLONOSCOPY WITH PROPOFOL;  Surgeon: Lollie Sails, MD;  Location: [facility] ENDOSCOPY;  Service: Endoscopy;  Laterality: N/A;   WISDOM TOOTH EXTRACTION     Patient Active Problem List   Diagnosis Date Noted   PFO (patent foramen ovale)    CAD (coronary artery disease), native coronary artery    Acute CVA (cerebrovascular accident) 06/15/2018   Depression 10/16/2017   Migraine 10/16/2017   Hyperglycemia, unspecified 07/10/2017   Hyperglycemia 07/10/2017   H/O dizziness 08/02/2016   Cervical disc disorder with radiculopathy of cervical region 04/12/2016    Nonallopathic lesion of thoracic region 04/12/2016   Nonallopathic lesion of rib cage 04/12/2016   Facet arthritis of cervical region 03/21/2016   Hyperlipidemia 01/26/2015   Labral tear of shoulder, right, subsequent encounter 12/04/2014   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   Impingement syndrome of right shoulder 05/28/2014   Right supraspinatus tenosynovitis 05/28/2014   Arthritis pain 04/03/2014   Fatigue 04/03/2014   Cervical intraepithelial neoplasia grade 1 06/20/2012   IBS (irritable bowel syndrome) 06/20/2012   Lumbar disc disease 06/20/2012    PCP: Adin Hector MD  REFERRING PROVIDER: Brown,Christopher R MD  REFERRING DIAG: cervical radiculopathy  THERAPY DIAG:  Cervicalgia  Abnormal posture  Sacrococcygeal disorders, not elsewhere classified  Other abnormalities of gait and mobility  Rationale for Evaluation and Treatment: Rehabilitation  ONSET DATE: 06/14/22  SUBJECTIVE:  SUBJECTIVE STATEMENT: Patient reports some discomfort with stretches afterwards. Has been complient with HEP.  Hand dominance: Right  PERTINENT HISTORY:  Patient is returning to PT for s/p cervical fusion C5-7 with bilateral foraminotomies at C6-7 on 06/14/22. Patient has a history of C6-7 arthroplasty and has been treated by this clinician before. PMH includes anxiety, CAD, depression, arthritis of cervical region, gastritis, GERD, cancer, stroke, HLD, HTN, IB, lumbar disc disease (fusion x2 ), migraine, numbness of L hand, headaches, and restless leg syndrome.  Patient had posterior cervical fusion from C5-C7 and bilateral foraminotomies at C6-7 on 06/14/22; Only restriction is no lifting >15 lb  PAIN:  Are you having pain? Yes: NPRS scale: 3/10 Pain location: lower cervical and across traps Pain  description: aching Aggravating factors: positioning Relieving factors: positioning, heat  PRECAUTIONS: Cervical  WEIGHT BEARING RESTRICTIONS:  no lifting>15 lb   FALLS:  Has patient fallen in last 6 months? No  LIVING ENVIRONMENT: Lives with: lives with their spouse Lives in: House/apartment Stairs: Yes: External:   steps;     OCCUPATION: Surveyor, quantity   PLOF: Independent  PATIENT GOALS: to have more range of motion  NEXT MD VISIT: July 3rd, 2024  OBJECTIVE:   DIAGNOSTIC FINDINGS:  IMPRESSION: 1. Anterior cervical fusion at C6-7. Susceptibility artifact partially obscures evaluation of the neural foramina and central canal. No significant right foraminal stenosis. Left uncovertebral degenerative changes with moderate left foraminal stenosis. 2. At C2-3 there is a minimal broad-based disc bulge. Moderate right facet arthropathy. Mild right foraminal stenosis. 3. At C3-4 there is a minimal broad-based disc bulge. Moderate right facet arthropathy. Mild left foraminal stenosis. 4. At C4-5 there is a minimal broad-based disc bulge. Mild right facet arthropathy. Moderate left foraminal stenosis. 5. At C7-T1 there is a minimal broad-based disc bulge. Mild right and moderate left foraminal stenosis.    IMPRESSION: 1. Anterior cervical fusion at C6-7. Susceptibility artifact partially obscures evaluation of the neural foramina and central canal. No significant right foraminal stenosis. Left uncovertebral degenerative changes with moderate left foraminal stenosis. 2. At C2-3 there is a minimal broad-based disc bulge. Moderate right facet arthropathy. Mild right foraminal stenosis. 3. At C3-4 there is a minimal broad-based disc bulge. Moderate right facet arthropathy. Mild left foraminal stenosis. 4. At C4-5 there is a minimal broad-based disc bulge. Mild right facet arthropathy. Moderate left foraminal stenosis. 5. At C7-T1 there is a minimal broad-based disc bulge.  Mild right and moderate left foraminal stenosis.  PATIENT SURVEYS:  NDI 42% FOTO 45  COGNITION: Overall cognitive status: Within functional limits for tasks assessed  SENSATION: Light touch: WFL; occasional R hand numbness with sleep   POSTURE: rounded shoulders  PALPATION: Hardware present, tenderness at C5-6, C6-7.    CERVICAL ROM:   Active ROM A/PROM (deg) eval  Flexion 24  Extension 25  Right lateral flexion 9  Left lateral flexion 19  Right rotation 18  Left rotation 19   (Blank rows = not tested)  UPPER EXTREMITY ROM:  Active ROM Right eval Left eval  Shoulder flexion [facility]  Shoulder extension    Shoulder abduction 170 140   (Blank rows = not tested)  UPPER EXTREMITY MMT:  MMT Right eval Left eval  Shoulder flexion 4+ 4+  Shoulder extension 4+ 4+  Shoulder abduction 4+ 4+  Shoulder adduction 4+ 4+  Shoulder extension 4+ 4+  Elbow flexion 4+ 4+  Elbow extension 4+ 4+   (Blank rows = not tested)    TODAY'S TREATMENT:  DATE: 08/29/22  Manual: Upper trap massage x8 minutes with implementation of effleurage and pettrisage Median nerve glide 10x each side RUE    TherEx: UBE: level 4; 2:30 minutes forward, 2:30 minutes backwards Scapular retraction with cervical rotation 10x 10 seconds each LE Chin tuck 10x Overhead Y 10x RTB RTB ER 15x  Cervical extension 10x 5 second holds Seated pectoral to trunk extension stretch 10x      PATIENT EDUCATION:  Education details: goals, POC, HEP Person educated: Patient Education method: Explanation, Demonstration, Tactile cues, and Verbal cues Education comprehension: verbalized understanding, returned demonstration, verbal cues required, and tactile cues required  HOME EXERCISE PROGRAM: Access Code: [email or web address] URL: [email or web address] Date:  08/24/2022 Prepared by: Janna Arch  Exercises - Supine Cervical Retraction with Towel  - 1 x daily - 7 x weekly - 2 sets - 10 reps - 5 hold - Seated Scapular Retraction  - 1 x daily - 7 x weekly - 2 sets - 10 reps - 5 hold - Supine Cervical Rotation AROM on Pillow  - 1 x daily - 7 x weekly - 2 sets - 10 reps - 5 hold - Neck Sidebending  - 1 x daily - 7 x weekly - 2 sets - 10 reps - 5 hold - Supine Cervical Flexion Extension on Pillow  - 1 x daily - 7 x weekly - 2 sets - 10 reps - 5 hold  ASSESSMENT:  CLINICAL IMPRESSION: Patient tolerates use of UBE with no pain increase. Gentle progression of ROM tolerated well. Posture education performed with posterior strengthening and pec lengthening. Nerve glide tolerated well with improvement with repetition. Gentle massage for reduction of upper trap tension performed with patient having significant tension noted bilaterally.  Patient will benefit from skilled physical therapy to reduce pain,improve ROM, and return to PLOF  OBJECTIVE IMPAIRMENTS: decreased activity tolerance, decreased endurance, decreased ROM, decreased strength, hypomobility, increased muscle spasms, impaired flexibility, improper body mechanics, postural dysfunction, and pain.   ACTIVITY LIMITATIONS: carrying, lifting, sitting, standing, reach over head, and work  PARTICIPATION LIMITATIONS: meal prep, cleaning, laundry, driving, shopping, community activity, occupation, and yard work  PERSONAL FACTORS: Past/current experiences, Environmental consultant, Sex, Time since onset of injury/illness/exacerbation, and 3+ comorbidities: anxiety, CAD, depression, arthritis of cervical region, gastritis, GERD, cancer, stroke, HLD, HTN, IB, lumbar disc disease (fusion x2 ), migraine, numbness of L hand, headaches, and restless leg syndrome  are also affecting patient's functional outcome.   REHAB POTENTIAL: Fair    CLINICAL DECISION MAKING: Evolving/moderate complexity  EVALUATION COMPLEXITY:  Moderate   GOALS: Goals reviewed with patient? Yes  SHORT TERM GOALS: Target date: 09/21/2022    Patient will be independent in home exercise program to improve strength/mobility for better functional independence with ADLs. Baseline: 3/27: given Goal status: INITIAL    LONG TERM GOALS: Target date: 10/19/2022    Patient will increase FOTO score to equal to or greater than  55   to demonstrate statistically significant improvement in mobility and quality of life.  Baseline: 45 Goal status: INITIAL  2.  Patient will reduce Neck Disability Index score to <10% to demonstrate minimal disability with ADL's including improved sleeping tolerance, sitting tolerance, etc for better mobility at home and work. Baseline: 42% Goal status: INITIAL  3.  Patient will improve cervical ROM by 20 degrees flexion, sidebending, and rotation for improved quality of life and ability to drive.  Baseline: 3/27: see above Goal status: INITIAL  4.  Patient will return to full work and recreational activities  without compensation or pain increase of >3/10.  Baseline: 3/27: unable to perform  Goal status: INITIAL    PLAN:  PT FREQUENCY: 2x/week  PT DURATION: 8 weeks  PLANNED INTERVENTIONS: Therapeutic exercises, Therapeutic activity, Neuromuscular re-education, Balance training, Gait training, Patient/Family education, Self Care, Joint mobilization, Stair training, Vestibular training, Canalith repositioning, Dry Needling, Spinal mobilization, Cryotherapy, Moist heat, scar mobilization, Traction, Ultrasound, Manual therapy, and Re-evaluation  PLAN FOR NEXT SESSION: upper trap release, cervical ROM increase, postural strengthening.    Janna Arch, PT 08/29/2022, 9:29 AM

## 2022-08-29 ENCOUNTER — Ambulatory Visit: Payer: Commercial Managed Care - PPO | Attending: Orthopedic Surgery

## 2022-08-29 DIAGNOSIS — R293 Abnormal posture: Secondary | ICD-10-CM | POA: Insufficient documentation

## 2022-08-29 DIAGNOSIS — M542 Cervicalgia: Secondary | ICD-10-CM | POA: Insufficient documentation

## 2022-08-29 DIAGNOSIS — M533 Sacrococcygeal disorders, not elsewhere classified: Secondary | ICD-10-CM | POA: Insufficient documentation

## 2022-08-29 DIAGNOSIS — R2689 Other abnormalities of gait and mobility: Secondary | ICD-10-CM | POA: Insufficient documentation

## 2022-08-31 ENCOUNTER — Other Ambulatory Visit: Payer: Self-pay

## 2022-08-31 ENCOUNTER — Ambulatory Visit: Payer: Commercial Managed Care - PPO

## 2022-08-31 ENCOUNTER — Telehealth: Payer: Self-pay

## 2022-08-31 DIAGNOSIS — R2689 Other abnormalities of gait and mobility: Secondary | ICD-10-CM | POA: Diagnosis not present

## 2022-08-31 DIAGNOSIS — Z1211 Encounter for screening for malignant neoplasm of colon: Secondary | ICD-10-CM | POA: Diagnosis not present

## 2022-08-31 DIAGNOSIS — Z83719 Family history of colon polyps, unspecified: Secondary | ICD-10-CM

## 2022-08-31 DIAGNOSIS — M542 Cervicalgia: Secondary | ICD-10-CM

## 2022-08-31 DIAGNOSIS — Z86010 Personal history of colonic polyps: Secondary | ICD-10-CM

## 2022-08-31 DIAGNOSIS — M519 Unspecified thoracic, thoracolumbar and lumbosacral intervertebral disc disorder: Secondary | ICD-10-CM | POA: Diagnosis not present

## 2022-08-31 DIAGNOSIS — I10 Essential (primary) hypertension: Secondary | ICD-10-CM | POA: Diagnosis not present

## 2022-08-31 DIAGNOSIS — R293 Abnormal posture: Secondary | ICD-10-CM

## 2022-08-31 DIAGNOSIS — F3289 Other specified depressive episodes: Secondary | ICD-10-CM | POA: Diagnosis not present

## 2022-08-31 DIAGNOSIS — K219 Gastro-esophageal reflux disease without esophagitis: Secondary | ICD-10-CM | POA: Diagnosis not present

## 2022-08-31 DIAGNOSIS — I2510 Atherosclerotic heart disease of native coronary artery without angina pectoris: Secondary | ICD-10-CM | POA: Diagnosis not present

## 2022-08-31 DIAGNOSIS — R7303 Prediabetes: Secondary | ICD-10-CM | POA: Diagnosis not present

## 2022-08-31 DIAGNOSIS — G43909 Migraine, unspecified, not intractable, without status migrainosus: Secondary | ICD-10-CM | POA: Diagnosis not present

## 2022-08-31 DIAGNOSIS — M533 Sacrococcygeal disorders, not elsewhere classified: Secondary | ICD-10-CM | POA: Diagnosis not present

## 2022-08-31 DIAGNOSIS — Z0000 Encounter for general adult medical examination without abnormal findings: Secondary | ICD-10-CM | POA: Diagnosis not present

## 2022-08-31 DIAGNOSIS — E7849 Other hyperlipidemia: Secondary | ICD-10-CM | POA: Diagnosis not present

## 2022-08-31 MED ORDER — NA SULFATE-K SULFATE-MG SULF 17.5-3.13-1.6 GM/177ML PO SOLN
1.0000 | Freq: Once | ORAL | 0 refills | Status: AC
  Filled 2022-08-31: qty 354, 1d supply, fill #0

## 2022-08-31 MED ORDER — LOSARTAN POTASSIUM 50 MG PO TABS
50.0000 mg | ORAL_TABLET | Freq: Every day | ORAL | 11 refills | Status: DC
  Filled 2022-08-31: qty 30, 30d supply, fill #0
  Filled 2022-08-31: qty 28, 28d supply, fill #0
  Filled 2022-08-31: qty 2, 2d supply, fill #0

## 2022-08-31 NOTE — Therapy (Signed)
OUTPATIENT PHYSICAL THERAPY TREATMENT   Patient Name: Lori Morales MRN: [id number] DOB:03-Apr-1961, 62 y.o., female Today's Date: 08/31/2022  END OF SESSION:  PT End of Session - 08/31/22 0852     Visit Number 3    Number of Visits 16    Date for PT Re-Evaluation 10/19/22    Authorization Type [employer] employee [employer]    Authorization Time Period 08/24/22-10/19/22    Progress Note Due on Visit 10    PT Start Time 0845    PT Stop Time 26   leaving early to make appointment   PT Time Calculation (min) 35 min    Activity Tolerance Patient tolerated treatment well;Patient limited by pain    Behavior During Therapy [facility] for tasks assessed/performed              Past Medical History:  Diagnosis Date   Arthritis    CAD (coronary artery disease), native coronary artery    mild non calcified plaque in mid LAD and prox LCx on coronary CTA 01/2019   Depression    GERD (gastroesophageal reflux disease)    Headache    migraines   Hx of squamous cell carcinoma 07/11/2016   Right distal dorsum lat forearm    Hypertension    IBS (irritable bowel syndrome)    Palpitations    PFO (patent foramen ovale)    small by cardiac CTA 01/2019   PONV (postoperative nausea and vomiting)    Restless legs syndrome    Squamous cell carcinoma of skin 12/202/2017   right distal dorsum lateral forearm   Past Surgical History:  Procedure Laterality Date   ABDOMINOPLASTY     BACK SURGERY     1 laminectomy, 1 fusion   COLONOSCOPY     COLONOSCOPY WITH PROPOFOL N/A 01/17/2017   Procedure: COLONOSCOPY WITH PROPOFOL;  Surgeon: Lollie Sails, MD;  Location: [facility] ENDOSCOPY;  Service: Endoscopy;  Laterality: N/A;   WISDOM TOOTH EXTRACTION     Patient Active Problem List   Diagnosis Date Noted   PFO (patent foramen ovale)    CAD (coronary artery disease), native coronary artery    Acute CVA (cerebrovascular accident) 06/15/2018   Depression 10/16/2017   Migraine 10/16/2017   Hyperglycemia, unspecified  07/10/2017   Hyperglycemia 07/10/2017   H/O dizziness 08/02/2016   Cervical disc disorder with radiculopathy of cervical region 04/12/2016   Nonallopathic lesion of thoracic region 04/12/2016   Nonallopathic lesion of rib cage 04/12/2016   Facet arthritis of cervical region 03/21/2016   Hyperlipidemia 01/26/2015   Labral tear of shoulder, right, subsequent encounter 12/04/2014   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 Cont with heat use at home to address stiffness. Has an appointment this morning at 9:30.   PERTINENT HISTORY:  Patient is returning to PT for s/p cervical fusion C5-7 with bilateral foraminotomies at C6-7 on 06/14/22. Patient has a history of C6-7 arthroplasty and has been treated by this clinician before. PMH includes anxiety, CAD, depression, arthritis of cervical region, gastritis, GERD, cancer, stroke, HLD, HTN, IB, lumbar disc disease (fusion x2 ), migraine, numbness of L hand, headaches, and restless leg syndrome.  Patient had posterior cervical fusion from C5-C7 and bilateral foraminotomies at C6-7 on 06/14/22; Only restriction is no lifting >15 lb  PAIN:  Are you having pain? Yes: NPRS scale: 3/10 Pain  location: lower cervical and across traps Pain description: aching Aggravating factors: positioning Relieving factors: positioning, heat  PRECAUTIONS: Cervical  WEIGHT BEARING RESTRICTIONS:  no lifting>15 lb   FALLS:  Has patient fallen in last 6 months? No   PATIENT GOALS: to have more range of motion  NEXT MD VISIT: July 3rd, 2024  OBJECTIVE:   TODAY'S TREATMENT:                                                                                                                              DATE: 08/31/22  -UBE 2 min fwd, 2 min backward c level 4 resistance, seat 5  -Rt lower cervical extensor/levator release, then pin and stretch paired with RUE active shoulder flexion/extension x 15 -supine bilat middle trap prayer stretch 2x30sec -Left levator release, then pin and stretch paired with Rt cervical flexion  -Supine capital extension, capital flexion A/ROM x10 each  -supine bilat isometric shoulder extension into table 15x3secH  -supine cervical rotation 1x10 bilat, alternating sides (pain free range)  -seated forward shoulder rolls, backward shoulder rolls 1x12 each    PATIENT EDUCATION:  Education details: goals, POC, HEP Person educated: Patient Education method: Explanation, Demonstration, Tactile cues, and Verbal cues Education comprehension: verbalized understanding, returned demonstration, verbal cues required, and tactile cues required  HOME EXERCISE PROGRAM: Access Code: [email or web address] URL: [email or web address] Date: 08/24/2022 Prepared by: Janna Arch  Exercises - Supine Cervical Retraction with Towel  - 1 x daily - 7 x weekly - 2 sets - 10 reps - 5 hold - Seated Scapular Retraction  - 1 x daily - 7 x weekly - 2 sets - 10 reps - 5 hold - Supine Cervical Rotation AROM on Pillow  - 1 x daily - 7 x weekly - 2 sets - 10 reps - 5 hold - Neck Sidebending  - 1 x daily - 7 x weekly - 2 sets - 10 reps - 5 hold - Supine Cervical Flexion Extension on Pillow  - 1  x daily - 7 x weekly - 2 sets - 10 reps - 5 hold  ASSESSMENT:  CLINICAL IMPRESSION: Pt continued to manage symptoms and HEP at home quite well. In clinic we continue to advance ROM  and postural motor function as appropriate, tolerated, and permitted. Addressing reactive muscle spasm remains a necessity to optimize tolerance and participation in session. Pt leaves with less pain than entered. Cues given throughout session for best form, neutral posture. Pt progressing well today goals of treatment overall.   OBJECTIVE IMPAIRMENTS: decreased activity tolerance, decreased endurance, decreased ROM, decreased strength, hypomobility, increased muscle spasms, impaired flexibility, improper body mechanics, postural dysfunction, and pain.   ACTIVITY LIMITATIONS: carrying, lifting, sitting, standing, reach over head, and work  PARTICIPATION LIMITATIONS: meal prep, cleaning, laundry, driving, shopping, community activity, occupation, and yard work  PERSONAL FACTORS: Past/current experiences, Environmental consultant, Sex, Time since onset of injury/illness/exacerbation, and 3+ comorbidities: anxiety, CAD, depression, arthritis of cervical region, gastritis, GERD, cancer, stroke, HLD, HTN, IB, lumbar disc disease (fusion x2 ), migraine, numbness of L hand, headaches, and restless leg syndrome  are also affecting patient's functional outcome.   REHAB POTENTIAL: Fair    CLINICAL DECISION MAKING: Evolving/moderate complexity  EVALUATION COMPLEXITY: Moderate   GOALS: Goals reviewed with patient? Yes  SHORT TERM GOALS: Target date: 09/21/2022    Patient will be independent in home exercise program to improve strength/mobility for better functional independence with ADLs. Baseline: 3/27: given Goal status: INITIAL    LONG TERM GOALS: Target date: 10/19/2022    Patient will increase FOTO score to equal to or greater than  55   to demonstrate statistically significant improvement in mobility and quality of life.   Baseline: 45 Goal status: INITIAL  2.  Patient will reduce Neck Disability Index score to <10% to demonstrate minimal disability with ADL's including improved sleeping tolerance, sitting tolerance, etc for better mobility at home and work. Baseline: 42% Goal status: INITIAL  3.  Patient will improve cervical ROM by 20 degrees flexion, sidebending, and rotation for improved quality of life and ability to drive.  Baseline: 3/27: see above Goal status: INITIAL  4.  Patient will return to full work and recreational activities without compensation or pain increase of >3/10.  Baseline: 3/27: unable to perform  Goal status: INITIAL    PLAN:  PT FREQUENCY: 2x/week  PT DURATION: 8 weeks  PLANNED INTERVENTIONS: Therapeutic exercises, Therapeutic activity, Neuromuscular re-education, Balance training, Gait training, Patient/Family education, Self Care, Joint mobilization, Stair training, Vestibular training, Canalith repositioning, Dry Needling, Spinal mobilization, Cryotherapy, Moist heat, scar mobilization, Traction, Ultrasound, Manual therapy, and Re-evaluation  PLAN FOR NEXT SESSION: upper trap, levator, semispinalis release prn, cervical ROM increase, postural strengthening.    Augusto Deckman C, PT 08/31/2022, 9:12 AM   9:13 AM, 08/31/22 Etta Grandchild, PT, DPT Physical Therapist - [place] [phone]

## 2022-08-31 NOTE — Telephone Encounter (Signed)
Gastroenterology Pre-Procedure Review  Request Date: 10/07/22 Requesting Physician: Dr. Allen Norris  PATIENT REVIEW QUESTIONS: The patient responded to the following health history questions as indicated:    1. Are you having any GI issues? no 2. Do you have a personal history of Polyps? yes (last colonoscopy performed by Dr. Gustavo Lah 01/17/17  noted 62mm polyp) 3. Do you have a family history of Colon Cancer or Polyps? yes (mother and father colon polyps, maternal grandmother colon cancer) 82. Diabetes Mellitus? no 5. Joint replacements in the past 12 months?no 6. Major health problems in the past 3 months?no 7. Any artificial heart valves, MVP, or defibrillator?no however patient has cardiac history will route cardiac clearance to Dr. Fletcher Anon    MEDICATIONS & ALLERGIES:    Patient reports the following regarding taking any anticoagulation/antiplatelet therapy:   Plavix, Coumadin, Eliquis, Xarelto, Lovenox, Pradaxa, Brilinta, or Effient? no Aspirin? yes (81mg  daily)  Patient confirms/reports the following medications:  Current Outpatient Medications  Medication Sig Dispense Refill   ALPRAZolam (XANAX) 0.5 MG tablet Take 1 tablet by mouth 1 hour prior to procedure 2 tablet 0   amitriptyline (ELAVIL) 10 MG tablet Take 10 mg by mouth at bedtime.      amitriptyline (ELAVIL) 10 MG tablet Take 1 tablet (10 mg total) by mouth at bedtime 90 tablet 3   aspirin EC 81 MG tablet Take 81 mg by mouth daily.      azithromycin (ZITHROMAX) 250 MG tablet Take 2 tablets (500mg ) by mouth on Day 1. Take 1 tablet by mouth on Days 2-5. 6 tablet 0   cefadroxil (DURICEF) 500 MG capsule Take one capsule by mouth twice a day until gone 6 capsule 0   celecoxib (CELEBREX) 200 MG capsule Take 200 mg by mouth every evening.     celecoxib (CELEBREX) 200 MG capsule Take 1 capsule (200 mg total) by mouth 2 (two) times daily as needed for Pain 180 capsule 3   celecoxib (CELEBREX) 200 MG capsule Take 1 capsule (200 mg total) by  mouth 2 (two) times daily as needed for Pain 180 capsule 3   celecoxib (CELEBREX) 200 MG capsule Take 1 capsule (200 mg total) by mouth 2 (two) times daily as needed for Pain 180 capsule 3   cephALEXin (KEFLEX) 500 MG capsule Take 1 capsule(s) EVERY 6 HOURS by mouth, begin 2 days prior to procedure, take day of procedure and 2 days after procedure 20 capsule 0   cyclobenzaprine (FLEXERIL) 10 MG tablet Take 1 tablet (10 mg total) by mouth at bedtime as needed for Muscle spasms 20 tablet 5   cyclobenzaprine (FLEXERIL) 10 MG tablet Take 1 tablet (10 mg total) by mouth at bedtime as needed for Muscle spasms 20 tablet 5   cyclobenzaprine (FLEXERIL) 10 MG tablet Take 1 tablet (10 mg total) by mouth at bedtime as needed for Muscle spasms. 20 tablet 5   diazepam (VALIUM) 10 MG tablet TAKE ONE TABLET BY MOUTH ONE HOUR PRIOR TO PROCEDURE 2 tablet 0   diazepam (VALIUM) 10 MG tablet take 1 tablet  45 minutes before procedure. 1 tablet 0   estradiol (ESTRACE) 0.1 MG/GM vaginal cream Place 1 gram vaginally twice a week and a small amount externally daily 42.5 g 4   Estradiol 10 MCG TABS vaginal tablet Place 1 tablet (10 mcg total) vaginally twice a week 24 tablet 4   Estradiol-Norethindrone Acet 0.5-0.1 MG tablet Take 1 tablet by mouth once daily 56 tablet 0   HYDROcodone-acetaminophen (NORCO) 10-325 MG tablet  Take 0.5 tablets by mouth every 6 (six) hours as needed for Pain for up to 5 days 10 tablet 0   HYDROcodone-acetaminophen (NORCO/VICODIN) 5-325 MG tablet TAKE 1 TO 2 TABLETS BY MOUTH EVERY 4 TO 6 HOURS AS NEEDED FOR PAIN. MAX 10 TABLETS PER DAY. 30 tablet 0   HYDROcodone-acetaminophen (NORCO/VICODIN) 5-325 MG tablet Take 1 tablet by mouth every 6 (six) hours as needed for Pain for up to 5 days 20 tablet 0   HYDROcodone-acetaminophen (NORCO/VICODIN) 5-325 MG tablet Take 1 tablet by mouth every 6 (six) hours as needed for Pain for up to 5 days 20 tablet 0   losartan (COZAAR) 25 MG tablet Take 1 tablet (25 mg  total) by mouth daily. 30 tablet 1   losartan (COZAAR) 25 MG tablet TAKE 1 TABLET BY MOUTH ONCE DAILY 90 tablet 1   losartan (COZAAR) 25 MG tablet Take 1 tablet (25 mg total) by mouth once daily 90 tablet 1   losartan (COZAAR) 25 MG tablet Take 1 tablet (25 mg total) by mouth once daily 90 tablet 3   losartan (COZAAR) 25 MG tablet Take 1 tablet (25 mg total) by mouth once daily 91 tablet 3   losartan (COZAAR) 50 MG tablet Take 1 tablet (50 mg total) by mouth daily. 30 tablet 11   methylPREDNISolone (MEDROL DOSEPAK) 4 MG TBPK tablet Follow package directions. 21 tablet 0   MIMVEY LO 0.5-0.1 MG per tablet Take 1 tablet by mouth at bedtime.   4   ondansetron (ZOFRAN) 8 MG tablet TAKE ONE TABLET BY MOUTH EVERY 8 HOURS AS NEEDED FOR NAUSEA 15 tablet 0   oseltamivir (TAMIFLU) 75 MG capsule Take 1 capsule (75 mg total) by mouth once daily for 10 days 10 capsule 0   pantoprazole (PROTONIX) 40 MG tablet Take 40 mg by mouth at bedtime.      pantoprazole (PROTONIX) 40 MG tablet TAKE 1 TABLET BY MOUTH ONCE DAILY 90 tablet 1   pantoprazole (PROTONIX) 40 MG tablet Take 1 tablet (40 mg total) by mouth once daily 90 tablet 3   pantoprazole (PROTONIX) 40 MG tablet Take 1 tablet (40 mg total) by mouth once daily 90 tablet 3   pantoprazole (PROTONIX) 40 MG tablet Take 1 tablet (40 mg total) by mouth once daily 90 tablet 3   pantoprazole (PROTONIX) 40 MG tablet Take 1 tablet (40 mg total) by mouth daily. 90 tablet 3   pramipexole (MIRAPEX) 0.25 MG tablet Take 1 to 1 and half tablet daily.     pramipexole (MIRAPEX) 0.25 MG tablet TAKE 2 TABLETS BY MOUTH NIGHTLY FOR 30 DAYS. PLEASE TAKE 1 & 1/2 TABLETS AS NEEDED. 90 tablet 5   pramipexole (MIRAPEX) 0.25 MG tablet TAKE 2 TABLETS BY MOUTH NIGHTLY FOR 30 DAYS. PLEASE TAKE 1 & 1/2 TABLETS AS NEEDED. 90 tablet 5   pramipexole (MIRAPEX) 0.25 MG tablet Take 1 tablet by mouth at bedtime 180 tablet 3   pramipexole (MIRAPEX) 0.25 MG tablet Take 1 tablet (0.25 mg total) by mouth  at bedtime. 180 tablet 3   pravastatin (PRAVACHOL) 40 MG tablet Take 1 tablet (40 mg total) by mouth daily. Please call to schedule office visit for further refills. Thank you! 30 tablet 0   pravastatin (PRAVACHOL) 40 MG tablet TAKE 1 TABLET BY MOUTH ONCE DAILY 90 tablet 1   pravastatin (PRAVACHOL) 40 MG tablet Take 1 tablet (40 mg total) by mouth once daily 90 tablet 3   pravastatin (PRAVACHOL) 40 MG tablet Take 1  tablet (40 mg total) by mouth once daily 90 tablet 3   pravastatin (PRAVACHOL) 40 MG tablet Take 1 tablet (40 mg total) by mouth daily. 90 tablet 3   predniSONE (DELTASONE) 10 MG tablet Take 4 tabs daily for 2 days, then 3 tabs daily for 2 days, then 2 tabs daily for 2 days, then 1 tab daily for 2 days 20 tablet 0   predniSONE (DELTASONE) 10 MG tablet Take 4 tabs daily for 2 days, then 3 tabs daily for 2 days, then 2 tabs daily for 2 days, then 1 tab daily for 2 days 20 tablet 0   predniSONE (DELTASONE) 10 MG tablet Take 4 tabs daily for 2 days, then 3 tabs daily for 2 days, then 2 tabs daily for 2 days, then 1 tab daily for 2 days 20 tablet 0   sucralfate (CARAFATE) 1 g tablet Take 1 tablet (1 g total) by mouth 4 (four) times daily -  before meals and at bedtime for 7 days. 28 tablet 2   tiZANidine (ZANAFLEX) 2 MG tablet Take 1 tablet (2 mg total) by mouth 3 (three) times daily as needed 60 tablet 11   tiZANidine (ZANAFLEX) 2 MG tablet Take 1 tablet (2 mg total) by mouth 3 (three) times daily as needed 60 tablet 11   tiZANidine (ZANAFLEX) 2 MG tablet Take 1 tablet (2 mg total) by mouth 3 (three) times daily as needed. 60 tablet 11   traMADol (ULTRAM) 50 MG tablet Take 1 tablet by mouth every 6 hours 30 tablet 0   triamcinolone cream (KENALOG) 0.1 % Apply daily under bandaid for up to 3 weeks to spot at finger. Avoid applying to face, groin, and axilla. Can also use twice a day as needed for bug bites up to 2 weeks. 30 g 0   zolpidem (AMBIEN CR) 6.25 MG CR tablet Take 6.25 mg by mouth at  bedtime as needed for sleep.      zolpidem (AMBIEN CR) 6.25 MG CR tablet TAKE 1 TABLET BY MOUTH NIGHTLY AS NEEDED FOR SLEEP 30 tablet 5   zolpidem (AMBIEN CR) 6.25 MG CR tablet TAKE 1 TABLET BY MOUTH NIGHTLY AS NEEDED FOR SLEEP 30 tablet 5   zolpidem (AMBIEN CR) 6.25 MG CR tablet Take 1 tablet (6.25 mg total) by mouth at bedtime as needed for Sleep 30 tablet 5   zolpidem (AMBIEN CR) 6.25 MG CR tablet Take 1 tablet (6.25 mg total) by mouth at bedtime as needed for sleep. 30 tablet 5   zolpidem (AMBIEN CR) 6.25 MG CR tablet Take 1 tablet (6.25 mg total) by mouth at bedtime as needed for Sleep 30 tablet 5   No current facility-administered medications for this visit.    Patient confirms/reports the following allergies:  Allergies  Allergen Reactions   Penicillins Hives and Rash   Pristiq [Desvenlafaxine] Swelling and Other (See Comments)    Headaches (reaction to Pristiq)   Rosuvastatin Other (See Comments)    Muscle aches      No orders of the defined types were placed in this encounter.   AUTHORIZATION INFORMATION Primary Insurance: 1D#: Group #:  Secondary Insurance: 1D#: Group #:  SCHEDULE INFORMATION: Date: 10/07/22 Time: Location: [place]

## 2022-09-02 ENCOUNTER — Telehealth: Payer: Self-pay

## 2022-09-02 NOTE — Telephone Encounter (Signed)
I have not seen her since 2020.  I do not think she requires any further cardiac workup as she had previous cardiac CTA in 2020 that showed no significant disease.

## 2022-09-02 NOTE — Telephone Encounter (Signed)
Per Dr. Kirke Corin blood thinner advice received in Epic on 09/02/22  I have not seen her since 2020.  I do not think she requires any further cardiac workup as she had previous cardiac CTA in 2020 that showed no significant disease.    Thanks, Jessie, [place]

## 2022-09-05 ENCOUNTER — Ambulatory Visit: Payer: Commercial Managed Care - PPO

## 2022-09-06 ENCOUNTER — Other Ambulatory Visit: Payer: Self-pay

## 2022-09-06 MED ORDER — LOSARTAN POTASSIUM 100 MG PO TABS
100.0000 mg | ORAL_TABLET | Freq: Every day | ORAL | 11 refills | Status: AC
  Filled 2022-09-06: qty 30, 30d supply, fill #0
  Filled 2022-10-16: qty 30, 30d supply, fill #1

## 2022-09-06 MED ORDER — CIPROFLOXACIN HCL 0.3 % OP SOLN
OPHTHALMIC | 0 refills | Status: DC
  Filled 2022-09-06: qty 10, 7d supply, fill #0

## 2022-09-08 ENCOUNTER — Ambulatory Visit: Payer: Commercial Managed Care - PPO

## 2022-09-08 NOTE — Therapy (Signed)
OUTPATIENT PHYSICAL THERAPY CERVICAL TREATMENT   Patient Name: Lori Morales MRN: [id number] DOB:03-02-1961, 62 y.o., female Today's Date: 09/12/2022  END OF SESSION:  PT End of Session - 09/12/22 0849     Visit Number 4    Number of Visits 16    Date for PT Re-Evaluation 10/19/22    Authorization Type [employer] employee [employer]    Authorization Time Period 08/24/22-10/19/22    Progress Note Due on Visit 10    PT Start Time 0845    PT Stop Time 0929    PT Time Calculation (min) 44 min    Activity Tolerance Patient tolerated treatment well;Patient limited by pain    Behavior During Therapy [facility] for tasks assessed/performed               Past Medical History:  Diagnosis Date   Arthritis    CAD (coronary artery disease), native coronary artery    mild non calcified plaque in mid LAD and prox LCx on coronary CTA 01/2019   Depression    GERD (gastroesophageal reflux disease)    Headache    migraines   Hx of squamous cell carcinoma 07/11/2016   Right distal dorsum lat forearm    Hypertension    IBS (irritable bowel syndrome)    Palpitations    PFO (patent foramen ovale)    small by cardiac CTA 01/2019   PONV (postoperative nausea and vomiting)    Restless legs syndrome    Squamous cell carcinoma of skin 12/202/2017   right distal dorsum lateral forearm   Past Surgical History:  Procedure Laterality Date   ABDOMINOPLASTY     BACK SURGERY     1 laminectomy, 1 fusion   COLONOSCOPY     COLONOSCOPY WITH PROPOFOL N/A 01/17/2017   Procedure: COLONOSCOPY WITH PROPOFOL;  Surgeon: Christena Deem, MD;  Location: [facility] ENDOSCOPY;  Service: Endoscopy;  Laterality: N/A;   WISDOM TOOTH EXTRACTION     Patient Active Problem List   Diagnosis Date Noted   PFO (patent foramen ovale)    CAD (coronary artery disease), native coronary artery    Acute CVA (cerebrovascular accident) 06/15/2018   Depression 10/16/2017   Migraine 10/16/2017   Hyperglycemia, unspecified 07/10/2017    Hyperglycemia 07/10/2017   H/O dizziness 08/02/2016   Cervical disc disorder with radiculopathy of cervical region 04/12/2016   Nonallopathic lesion of thoracic region 04/12/2016   Nonallopathic lesion of rib cage 04/12/2016   Facet arthritis of cervical region 03/21/2016   Hyperlipidemia 01/26/2015   Labral tear of shoulder, right, subsequent encounter 12/04/2014   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   Impingement syndrome of right shoulder 05/28/2014   Right supraspinatus tenosynovitis 05/28/2014   Arthritis pain 04/03/2014   Fatigue 04/03/2014   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 Was able to do some gentle sewing over the weekend.  Hand dominance: Right  PERTINENT HISTORY:  Patient is returning to PT for s/p cervical fusion C5-7 with bilateral foraminotomies at C6-7 on 06/14/22. Patient has a history of C6-7 arthroplasty and has been treated by this clinician before. PMH includes anxiety, CAD, depression, arthritis of cervical region, gastritis, GERD, cancer, stroke, HLD, HTN, IB, lumbar disc disease (fusion x2 ), migraine, numbness of L hand, headaches, and restless leg syndrome.  Patient had posterior cervical fusion from C5-C7 and bilateral  foraminotomies at C6-7 on 06/14/22; Only restriction is no lifting >15 lb  PAIN:  Are you having pain? Yes: NPRS scale: 3/10 Pain location: lower cervical and across traps Pain description: aching Aggravating factors: positioning Relieving factors: positioning, heat  PRECAUTIONS: Cervical  WEIGHT BEARING RESTRICTIONS:  no lifting>15 lb   FALLS:  Has patient fallen in last 6 months? No  LIVING ENVIRONMENT: Lives with: lives with their spouse Lives in: House/apartment Stairs: Yes: External:   steps;     OCCUPATION: Chiropodist   PLOF: Independent  PATIENT GOALS: to have more range of motion  NEXT MD VISIT: July 3rd, 2024  OBJECTIVE:   DIAGNOSTIC FINDINGS:  IMPRESSION: 1. Anterior cervical fusion at C6-7. Susceptibility artifact partially obscures evaluation of the neural foramina and central canal. No significant right foraminal stenosis. Left uncovertebral degenerative changes with moderate left foraminal stenosis. 2. At C2-3 there is a minimal broad-based disc bulge. Moderate right facet arthropathy. Mild right foraminal stenosis. 3. At C3-4 there is a minimal broad-based disc bulge. Moderate right facet arthropathy. Mild left foraminal stenosis. 4. At C4-5 there is a minimal broad-based disc bulge. Mild right facet arthropathy. Moderate left foraminal stenosis. 5. At C7-T1 there is a minimal broad-based disc bulge. Mild right and moderate left foraminal stenosis.    IMPRESSION: 1. Anterior cervical fusion at C6-7. Susceptibility artifact partially obscures evaluation of the neural foramina and central canal. No significant right foraminal stenosis. Left uncovertebral degenerative changes with moderate left foraminal stenosis. 2. At C2-3 there is a minimal broad-based disc bulge. Moderate right facet arthropathy. Mild right foraminal stenosis. 3. At C3-4 there is a minimal broad-based disc bulge. Moderate right facet arthropathy. Mild left foraminal  stenosis. 4. At C4-5 there is a minimal broad-based disc bulge. Mild right facet arthropathy. Moderate left foraminal stenosis. 5. At C7-T1 there is a minimal broad-based disc bulge. Mild right and moderate left foraminal stenosis.  PATIENT SURVEYS:  NDI 42% FOTO 45  COGNITION: Overall cognitive status: Within functional limits for tasks assessed  SENSATION: Light touch: WFL; occasional R hand numbness with sleep   POSTURE: rounded shoulders  PALPATION: Hardware present, tenderness at C5-6, C6-7.    CERVICAL ROM:   Active ROM A/PROM (deg) eval  Flexion 24  Extension 25  Right lateral flexion 9  Left lateral flexion 19  Right rotation 18  Left rotation 19   (Blank rows = not tested)  UPPER EXTREMITY ROM:  Active ROM Right eval Left eval  Shoulder flexion [facility]  Shoulder extension    Shoulder abduction 170 140   (Blank rows = not tested)  UPPER EXTREMITY MMT:  MMT Right eval Left eval  Shoulder flexion 4+ 4+  Shoulder extension 4+ 4+  Shoulder abduction 4+ 4+  Shoulder adduction 4+ 4+  Shoulder extension 4+ 4+  Elbow flexion 4+ 4+  Elbow extension 4+ 4+   (  Blank rows = not tested)    TODAY'S TREATMENT:                                                                                                                              DATE: 09/12/22  Manual: Upper trap massage and scar tissue massage x10 minutes with implementation of effleurage and pettrisage Median nerve glide 10x each side RUE  Cervical side bend with overpressure at [facility] joint 30 seconds x 2 trials each LE   TherEx: UBE: level 4; 2:30 minutes forward, 2:30 minutes backwards Scapular retraction with cervical rotation 10x 10 seconds each LE Chin tuck 10x Overhead Y 10x RTB RTB ER 15x  Cervical extension 10x 5 second holds Cervical flexion 5 second holds x 10x  Seated pectoral to trunk extension stretch 10x      PATIENT EDUCATION:  Education details: goals, POC, HEP Person  educated: Patient Education method: Explanation, Demonstration, Tactile cues, and Verbal cues Education comprehension: verbalized understanding, returned demonstration, verbal cues required, and tactile cues required  HOME EXERCISE PROGRAM: Access Code: [email or web address] URL: [email or web address] Date: 08/24/2022 Prepared by: Precious Bard  Exercises - Supine Cervical Retraction with Towel  - 1 x daily - 7 x weekly - 2 sets - 10 reps - 5 hold - Seated Scapular Retraction  - 1 x daily - 7 x weekly - 2 sets - 10 reps - 5 hold - Supine Cervical Rotation AROM on Pillow  - 1 x daily - 7 x weekly - 2 sets - 10 reps - 5 hold - Neck Sidebending  - 1 x daily - 7 x weekly - 2 sets - 10 reps - 5 hold - Supine Cervical Flexion Extension on Pillow  - 1 x daily - 7 x weekly - 2 sets - 10 reps - 5 hold  ASSESSMENT:  CLINICAL IMPRESSION: Patient presents with excellent motivation. She has limited R cervical motion due to limited muscle tissue length. She is able to improve ROM with repetition of exercises. Postural education tolerated well. Educated on setting timer for 30 minutes while at work to reposition, perform stretches; patient agreeable.  Patient will benefit from skilled physical therapy to reduce pain,improve ROM, and return to PLOF  OBJECTIVE IMPAIRMENTS: decreased activity tolerance, decreased endurance, decreased ROM, decreased strength, hypomobility, increased muscle spasms, impaired flexibility, improper body mechanics, postural dysfunction, and pain.   ACTIVITY LIMITATIONS: carrying, lifting, sitting, standing, reach over head, and work  PARTICIPATION LIMITATIONS: meal prep, cleaning, laundry, driving, shopping, community activity, occupation, and yard work  PERSONAL FACTORS: Past/current experiences, Catering manager, Sex, Time since onset of injury/illness/exacerbation, and 3+ comorbidities: anxiety, CAD, depression, arthritis of cervical region, gastritis, GERD, cancer, stroke, HLD,  HTN, IB, lumbar disc disease (fusion x2 ), migraine, numbness of L hand, headaches, and restless leg syndrome  are also affecting patient's functional outcome.   REHAB POTENTIAL: Fair    CLINICAL DECISION MAKING: Evolving/moderate complexity  EVALUATION COMPLEXITY: Moderate   GOALS: Goals reviewed  with patient? Yes  SHORT TERM GOALS: Target date: 09/21/2022    Patient will be independent in home exercise program to improve strength/mobility for better functional independence with ADLs. Baseline: 3/27: given Goal status: INITIAL    LONG TERM GOALS: Target date: 10/19/2022    Patient will increase FOTO score to equal to or greater than  55   to demonstrate statistically significant improvement in mobility and quality of life.  Baseline: 45 Goal status: INITIAL  2.  Patient will reduce Neck Disability Index score to <10% to demonstrate minimal disability with ADL's including improved sleeping tolerance, sitting tolerance, etc for better mobility at home and work. Baseline: 42% Goal status: INITIAL  3.  Patient will improve cervical ROM by 20 degrees flexion, sidebending, and rotation for improved quality of life and ability to drive.  Baseline: 3/27: see above Goal status: INITIAL  4.  Patient will return to full work and recreational activities without compensation or pain increase of >3/10.  Baseline: 3/27: unable to perform  Goal status: INITIAL    PLAN:  PT FREQUENCY: 2x/week  PT DURATION: 8 weeks  PLANNED INTERVENTIONS: Therapeutic exercises, Therapeutic activity, Neuromuscular re-education, Balance training, Gait training, Patient/Family education, Self Care, Joint mobilization, Stair training, Vestibular training, Canalith repositioning, Dry Needling, Spinal mobilization, Cryotherapy, Moist heat, scar mobilization, Traction, Ultrasound, Manual therapy, and Re-evaluation  PLAN FOR NEXT SESSION: upper trap release, cervical ROM increase, postural strengthening.     Precious BardMarina Taniela Feltus, PT 09/12/2022, 9:31 AM

## 2022-09-12 ENCOUNTER — Ambulatory Visit: Payer: Commercial Managed Care - PPO

## 2022-09-12 DIAGNOSIS — R2689 Other abnormalities of gait and mobility: Secondary | ICD-10-CM | POA: Diagnosis not present

## 2022-09-12 DIAGNOSIS — R293 Abnormal posture: Secondary | ICD-10-CM | POA: Diagnosis not present

## 2022-09-12 DIAGNOSIS — M542 Cervicalgia: Secondary | ICD-10-CM

## 2022-09-12 DIAGNOSIS — M533 Sacrococcygeal disorders, not elsewhere classified: Secondary | ICD-10-CM | POA: Diagnosis not present

## 2022-09-14 NOTE — Therapy (Signed)
OUTPATIENT PHYSICAL THERAPY CERVICAL TREATMENT   Patient Name: Lori Morales MRN: [id number] DOB:04-02-1961, 62 y.o., female Today's Date: 09/15/2022  END OF SESSION:  PT End of Session - 09/15/22 0849     Visit Number 5    Number of Visits 16    Date for PT Re-Evaluation 10/19/22    Authorization Type [employer] employee [employer]    Authorization Time Period 08/24/22-10/19/22    Progress Note Due on Visit 10    PT Start Time 0845    PT Stop Time 0929    PT Time Calculation (min) 44 min    Activity Tolerance Patient tolerated treatment well;Patient limited by pain    Behavior During Therapy [facility] for tasks assessed/performed               Past Medical History:  Diagnosis Date   Arthritis    CAD (coronary artery disease), native coronary artery    mild non calcified plaque in mid LAD and prox LCx on coronary CTA 01/2019   Depression    GERD (gastroesophageal reflux disease)    Headache    migraines   Hx of squamous cell carcinoma 07/11/2016   Right distal dorsum lat forearm    Hypertension    IBS (irritable bowel syndrome)    Palpitations    PFO (patent foramen ovale)    small by cardiac CTA 01/2019   PONV (postoperative nausea and vomiting)    Restless legs syndrome    Squamous cell carcinoma of skin 12/202/2017   right distal dorsum lateral forearm   Past Surgical History:  Procedure Laterality Date   ABDOMINOPLASTY     BACK SURGERY     1 laminectomy, 1 fusion   COLONOSCOPY     COLONOSCOPY WITH PROPOFOL N/A 01/17/2017   Procedure: COLONOSCOPY WITH PROPOFOL;  Surgeon: Christena Deem, MD;  Location: [facility] ENDOSCOPY;  Service: Endoscopy;  Laterality: N/A;   WISDOM TOOTH EXTRACTION     Patient Active Problem List   Diagnosis Date Noted   PFO (patent foramen ovale)    CAD (coronary artery disease), native coronary artery    Acute CVA (cerebrovascular accident) 06/15/2018   Depression 10/16/2017   Migraine 10/16/2017   Hyperglycemia, unspecified 07/10/2017    Hyperglycemia 07/10/2017   H/O dizziness 08/02/2016   Cervical disc disorder with radiculopathy of cervical region 04/12/2016   Nonallopathic lesion of thoracic region 04/12/2016   Nonallopathic lesion of rib cage 04/12/2016   Facet arthritis of cervical region 03/21/2016   Hyperlipidemia 01/26/2015   Labral tear of shoulder, right, subsequent encounter 12/04/2014   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   Impingement syndrome of right shoulder 05/28/2014   Right supraspinatus tenosynovitis 05/28/2014   Arthritis pain 04/03/2014   Fatigue 04/03/2014   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  Hand dominance: Right  PERTINENT HISTORY:  Patient is returning to PT for s/p cervical fusion C5-7 with bilateral foraminotomies at C6-7 on 06/14/22. Patient has a history of C6-7 arthroplasty and has been treated by this clinician before. PMH includes anxiety, CAD, depression, arthritis of cervical region, gastritis, GERD, cancer, stroke, HLD, HTN, IB, lumbar disc disease (fusion x2 ), migraine, numbness of L hand, headaches, and restless leg syndrome.  Patient had posterior cervical fusion from C5-C7 and bilateral foraminotomies at C6-7 on 06/14/22; Only restriction is no lifting >15 lb  PAIN:  Are  you having pain? Yes: NPRS scale: 3/10 Pain location: lower cervical and across traps Pain description: aching Aggravating factors: positioning Relieving factors: positioning, heat  PRECAUTIONS: Cervical  WEIGHT BEARING RESTRICTIONS:  no lifting>15 lb   FALLS:  Has patient fallen in last 6 months? No  LIVING ENVIRONMENT: Lives with: lives with their spouse Lives in: House/apartment Stairs: Yes: External:   steps;     OCCUPATION: Chiropodist   PLOF: Independent  PATIENT GOALS: to have more range of motion  NEXT MD VISIT: July 3rd, 2024  OBJECTIVE:   DIAGNOSTIC FINDINGS:  IMPRESSION: 1. Anterior cervical fusion at C6-7. Susceptibility artifact partially obscures evaluation of the neural foramina and central canal. No significant right foraminal stenosis. Left uncovertebral degenerative changes with moderate left foraminal stenosis. 2. At C2-3 there is a minimal broad-based disc bulge. Moderate right facet arthropathy. Mild right foraminal stenosis. 3. At C3-4 there is a minimal broad-based disc bulge. Moderate right facet arthropathy. Mild left foraminal stenosis. 4. At C4-5 there is a minimal broad-based disc bulge. Mild right facet arthropathy. Moderate left foraminal stenosis. 5. At C7-T1 there is a minimal broad-based disc bulge. Mild right and moderate left foraminal stenosis.    IMPRESSION: 1. Anterior cervical fusion at C6-7. Susceptibility artifact partially obscures evaluation of the neural foramina and central canal. No significant right foraminal stenosis. Left uncovertebral degenerative changes with moderate left foraminal stenosis. 2. At C2-3 there is a minimal broad-based disc bulge. Moderate right facet arthropathy. Mild right foraminal stenosis. 3. At C3-4 there is a minimal broad-based disc bulge. Moderate right facet arthropathy. Mild left foraminal stenosis. 4. At C4-5 there is a minimal broad-based disc bulge. Mild right facet  arthropathy. Moderate left foraminal stenosis. 5. At C7-T1 there is a minimal broad-based disc bulge. Mild right and moderate left foraminal stenosis.  PATIENT SURVEYS:  NDI 42% FOTO 45  COGNITION: Overall cognitive status: Within functional limits for tasks assessed  SENSATION: Light touch: WFL; occasional R hand numbness with sleep   POSTURE: rounded shoulders  PALPATION: Hardware present, tenderness at C5-6, C6-7.    CERVICAL ROM:   Active ROM A/PROM (deg) eval  Flexion 24  Extension 25  Right lateral flexion 9  Left lateral flexion 19  Right rotation 18  Left rotation 19   (Blank rows = not tested)  UPPER EXTREMITY ROM:  Active ROM Right eval Left eval  Shoulder flexion [facility]  Shoulder extension    Shoulder abduction 170 140   (Blank rows = not tested)  UPPER EXTREMITY MMT:  MMT Right eval Left eval  Shoulder flexion 4+ 4+  Shoulder extension 4+ 4+  Shoulder abduction 4+ 4+  Shoulder adduction 4+ 4+  Shoulder extension 4+ 4+  Elbow flexion 4+ 4+  Elbow extension 4+ 4+   (Blank rows = not tested)  TODAY'S TREATMENT:                                                                                                                              DATE: 09/15/22  Manual: Upper trap massage x8 minutes with implementation of effleurage and pettrisage Median nerve glide 10x each side RUE  Cervical rotation with overpressure at [facility] joint 30 seconds x 2 trials each side  TherEx: UBE: level 4; 2:30 minutes forward, 2:30 minutes backwards Scapular retraction with cervical rotation 10x 10 seconds each LE Chin tuck 10x Overhead Y 10x RTB; x 2 sets  RTB ER 15x  Cervical extension 10x 5 second holds Cervical side bend 10x 5 second holds  Seated pectoral to trunk extension stretch 10x      PATIENT EDUCATION:  Education details: goals, POC, HEP Person educated: Patient Education method: Explanation, Demonstration, Tactile cues, and Verbal  cues Education comprehension: verbalized understanding, returned demonstration, verbal cues required, and tactile cues required  HOME EXERCISE PROGRAM: Access Code: [email or web address] URL: [email or web address] Date: 08/24/2022 Prepared by: Precious Bard  Exercises - Supine Cervical Retraction with Towel  - 1 x daily - 7 x weekly - 2 sets - 10 reps - 5 hold - Seated Scapular Retraction  - 1 x daily - 7 x weekly - 2 sets - 10 reps - 5 hold - Supine Cervical Rotation AROM on Pillow  - 1 x daily - 7 x weekly - 2 sets - 10 reps - 5 hold - Neck Sidebending  - 1 x daily - 7 x weekly - 2 sets - 10 reps - 5 hold - Supine Cervical Flexion Extension on Pillow  - 1 x daily - 7 x weekly - 2 sets - 10 reps - 5 hold  ASSESSMENT:  CLINICAL IMPRESSION: Patient presents with excellent motivation but does have increased bilateral upper trap tension with L > R. Patient educated on scapular retraction and depression during work time to reduce tension and improve cervical. Education on need to let physician know of continuation of nerve pains. Patient requires cues for control of  Patient will benefit from skilled physical therapy to reduce pain,improve ROM, and return to PLOF  OBJECTIVE IMPAIRMENTS: decreased activity tolerance, decreased endurance, decreased ROM, decreased strength, hypomobility, increased muscle spasms, impaired flexibility, improper body mechanics, postural dysfunction, and pain.   ACTIVITY LIMITATIONS: carrying, lifting, sitting, standing, reach over head, and work  PARTICIPATION LIMITATIONS: meal prep, cleaning, laundry, driving, shopping, community activity, occupation, and yard work  PERSONAL FACTORS: Past/current experiences, Catering manager, Sex, Time since onset of injury/illness/exacerbation, and 3+ comorbidities: anxiety, CAD, depression, arthritis of cervical region, gastritis, GERD, cancer, stroke, HLD, HTN, IB, lumbar disc disease (fusion x2 ), migraine, numbness of L hand,  headaches, and restless leg syndrome  are also affecting patient's functional outcome.   REHAB POTENTIAL: Fair    CLINICAL DECISION MAKING: Evolving/moderate complexity  EVALUATION COMPLEXITY: Moderate   GOALS: Goals reviewed with patient? Yes  SHORT TERM  GOALS: Target date: 09/21/2022    Patient will be independent in home exercise program to improve strength/mobility for better functional independence with ADLs. Baseline: 3/27: given Goal status: INITIAL    LONG TERM GOALS: Target date: 10/19/2022    Patient will increase FOTO score to equal to or greater than  55   to demonstrate statistically significant improvement in mobility and quality of life.  Baseline: 45 Goal status: INITIAL  2.  Patient will reduce Neck Disability Index score to <10% to demonstrate minimal disability with ADL's including improved sleeping tolerance, sitting tolerance, etc for better mobility at home and work. Baseline: 42% Goal status: INITIAL  3.  Patient will improve cervical ROM by 20 degrees flexion, sidebending, and rotation for improved quality of life and ability to drive.  Baseline: 3/27: see above Goal status: INITIAL  4.  Patient will return to full work and recreational activities without compensation or pain increase of >3/10.  Baseline: 3/27: unable to perform  Goal status: INITIAL    PLAN:  PT FREQUENCY: 2x/week  PT DURATION: 8 weeks  PLANNED INTERVENTIONS: Therapeutic exercises, Therapeutic activity, Neuromuscular re-education, Balance training, Gait training, Patient/Family education, Self Care, Joint mobilization, Stair training, Vestibular training, Canalith repositioning, Dry Needling, Spinal mobilization, Cryotherapy, Moist heat, scar mobilization, Traction, Ultrasound, Manual therapy, and Re-evaluation  PLAN FOR NEXT SESSION: upper trap release, cervical ROM increase, postural strengthening.    Precious Bard, PT 09/15/2022, 9:34 AM

## 2022-09-15 ENCOUNTER — Ambulatory Visit: Payer: Commercial Managed Care - PPO

## 2022-09-15 ENCOUNTER — Other Ambulatory Visit: Payer: Self-pay

## 2022-09-15 DIAGNOSIS — M542 Cervicalgia: Secondary | ICD-10-CM | POA: Diagnosis not present

## 2022-09-15 DIAGNOSIS — R293 Abnormal posture: Secondary | ICD-10-CM

## 2022-09-15 DIAGNOSIS — R2689 Other abnormalities of gait and mobility: Secondary | ICD-10-CM

## 2022-09-15 DIAGNOSIS — M533 Sacrococcygeal disorders, not elsewhere classified: Secondary | ICD-10-CM | POA: Diagnosis not present

## 2022-09-15 MED ORDER — LOSARTAN POTASSIUM 100 MG PO TABS
100.0000 mg | ORAL_TABLET | Freq: Every day | ORAL | 3 refills | Status: DC
  Filled 2022-09-15: qty 90, 90d supply, fill #0

## 2022-09-18 ENCOUNTER — Other Ambulatory Visit: Payer: Self-pay

## 2022-09-19 ENCOUNTER — Ambulatory Visit: Payer: Commercial Managed Care - PPO

## 2022-09-19 ENCOUNTER — Other Ambulatory Visit: Payer: Self-pay

## 2022-09-19 DIAGNOSIS — R2689 Other abnormalities of gait and mobility: Secondary | ICD-10-CM | POA: Diagnosis not present

## 2022-09-19 DIAGNOSIS — R293 Abnormal posture: Secondary | ICD-10-CM | POA: Diagnosis not present

## 2022-09-19 DIAGNOSIS — M533 Sacrococcygeal disorders, not elsewhere classified: Secondary | ICD-10-CM | POA: Diagnosis not present

## 2022-09-19 DIAGNOSIS — M542 Cervicalgia: Secondary | ICD-10-CM | POA: Diagnosis not present

## 2022-09-19 NOTE — Therapy (Signed)
OUTPATIENT PHYSICAL THERAPY CERVICAL TREATMENT   Patient Name: Lori Morales MRN: [id number] DOB:[birth year], 62 y.o., female Today's Date: 09/19/2022  END OF SESSION:  PT End of Session - 09/19/22 0844     Visit Number 6    Number of Visits 16    Date for PT Re-Evaluation 10/19/22    Authorization Type [employer] employee [employer]    Authorization Time Period 08/24/22-10/19/22    Progress Note Due on Visit 10    PT Start Time 0845    PT Stop Time 0928    PT Time Calculation (min) 43 min    Activity Tolerance Patient tolerated treatment well;Patient limited by pain    Behavior During Therapy [facility] for tasks assessed/performed               Past Medical History:  Diagnosis Date   Arthritis    CAD (coronary artery disease), native coronary artery    mild non calcified plaque in mid LAD and prox LCx on coronary CTA 01/2019   Depression    GERD (gastroesophageal reflux disease)    Headache    migraines   Hx of squamous cell carcinoma 07/11/2016   Right distal dorsum lat forearm    Hypertension    IBS (irritable bowel syndrome)    Palpitations    PFO (patent foramen ovale)    small by cardiac CTA 01/2019   PONV (postoperative nausea and vomiting)    Restless legs syndrome    Squamous cell carcinoma of skin 12/202/2017   right distal dorsum lateral forearm   Past Surgical History:  Procedure Laterality Date   ABDOMINOPLASTY     BACK SURGERY     1 laminectomy, 1 fusion   COLONOSCOPY     COLONOSCOPY WITH PROPOFOL N/A 01/17/2017   Procedure: COLONOSCOPY WITH PROPOFOL;  Surgeon: Christena Deem, MD;  Location: [facility] ENDOSCOPY;  Service: Endoscopy;  Laterality: N/A;   WISDOM TOOTH EXTRACTION     Patient Active Problem List   Diagnosis Date Noted   PFO (patent foramen ovale)    CAD (coronary artery disease), native coronary artery    Acute CVA (cerebrovascular accident) 06/15/2018   Depression 10/16/2017   Migraine 10/16/2017   Hyperglycemia, unspecified 07/10/2017    Hyperglycemia 07/10/2017   H/O dizziness 08/02/2016   Cervical disc disorder with radiculopathy of cervical region 04/12/2016   Nonallopathic lesion of thoracic region 04/12/2016   Nonallopathic lesion of rib cage 04/12/2016   Facet arthritis of cervical region 03/21/2016   Hyperlipidemia 01/26/2015   Labral tear of shoulder, right, subsequent encounter 12/04/2014   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 Re-arranged the unit on Friday and did some house cleaning over the weekend and had some "zapping sensations" up to 6x in her neck bilaterally. Up to 5/10 NPS but lasted only seconds. Also dealing with onset of LBP but unsure of it's cause.  Hand dominance: Right  PERTINENT HISTORY:  Patient is returning to PT for s/p cervical fusion C5-7 with bilateral foraminotomies at C6-7 on 06/14/22. Patient has a history of C6-7 arthroplasty and has been treated by this clinician before. PMH includes anxiety, CAD, depression, arthritis of cervical region, gastritis, GERD, cancer, stroke, HLD, HTN, IB, lumbar disc disease (fusion x2 ), migraine, numbness of L hand, headaches, and restless leg syndrome.  Patient had  posterior cervical fusion from C5-C7 and bilateral foraminotomies at C6-7 on 06/14/22; Only restriction is no lifting >15 lb  PAIN:  Are you having pain? Yes: NPRS scale: 3/10 Pain location: lower cervical and across traps Pain description: aching Aggravating factors: positioning Relieving factors: positioning, heat  PRECAUTIONS: Cervical  WEIGHT BEARING RESTRICTIONS:  no lifting>15 lb   FALLS:  Has patient fallen in last 6 months? No  LIVING ENVIRONMENT: Lives with: lives with their spouse Lives in: House/apartment Stairs: Yes: External:   steps;     OCCUPATION: Chiropodist   PLOF: Independent  PATIENT GOALS: to have more range of motion  NEXT MD VISIT: July 3rd, 2024  OBJECTIVE:   DIAGNOSTIC FINDINGS:  IMPRESSION: 1. Anterior cervical fusion at C6-7. Susceptibility artifact partially obscures evaluation of the neural foramina and central canal. No significant right foraminal stenosis. Left uncovertebral degenerative changes with moderate left foraminal stenosis. 2. At C2-3 there is a minimal broad-based disc bulge. Moderate right facet arthropathy. Mild right foraminal stenosis. 3. At C3-4 there is a minimal broad-based disc bulge. Moderate right facet arthropathy. Mild left foraminal stenosis. 4. At C4-5 there is a minimal broad-based disc bulge. Mild right facet arthropathy. Moderate left foraminal stenosis. 5. At C7-T1 there is a minimal broad-based disc bulge. Mild right and moderate left foraminal stenosis.    IMPRESSION: 1. Anterior cervical fusion at C6-7. Susceptibility artifact partially obscures evaluation of the neural foramina and central canal. No significant right foraminal stenosis. Left uncovertebral degenerative changes with moderate left foraminal stenosis. 2. At C2-3 there is a minimal broad-based disc bulge. Moderate right facet arthropathy. Mild right foraminal stenosis. 3. At C3-4 there is a minimal broad-based disc bulge. Moderate  right facet arthropathy. Mild left foraminal stenosis. 4. At C4-5 there is a minimal broad-based disc bulge. Mild right facet arthropathy. Moderate left foraminal stenosis. 5. At C7-T1 there is a minimal broad-based disc bulge. Mild right and moderate left foraminal stenosis.  PATIENT SURVEYS:  NDI 42% FOTO 45  COGNITION: Overall cognitive status: Within functional limits for tasks assessed  SENSATION: Light touch: WFL; occasional R hand numbness with sleep   POSTURE: rounded shoulders  PALPATION: Hardware present, tenderness at C5-6, C6-7.    CERVICAL ROM:   Active ROM A/PROM (deg) eval  Flexion 24  Extension 25  Right lateral flexion 9  Left lateral flexion 19  Right rotation 18  Left rotation 19   (Blank rows = not tested)  UPPER EXTREMITY ROM:  Active ROM Right eval Left eval  Shoulder flexion [facility]  Shoulder extension    Shoulder abduction 170 140   (Blank rows = not tested)  UPPER EXTREMITY MMT:  MMT Right eval Left eval  Shoulder flexion 4+ 4+  Shoulder extension 4+ 4+  Shoulder abduction 4+ 4+  Shoulder adduction 4+ 4+  Shoulder extension 4+ 4+  Elbow flexion 4+ 4+  Elbow extension 4+ 4+   (Blank rows = not tested)    TODAY'S TREATMENT:                                                                                                                              DATE: 09/19/22  Manual Therapy: B UT release in hook lying. x8 minutes with implementation of effleurage and ptrissage to improve cervical AROM.   B UT stretch with OP at GHJ: 3x30 sec/side    There.Ex: UBE: level 4; 2:30 minutes forward, 2:30 minutes backwards.  Hook lying: AAROM shoulder flexion overhead with PVC pipe for pec stretch, thoracic extension, and scapulohumeral rhythm. 2x12  Chin tuck: 2x12  Cervical rotation: x12/side, 2-3 sec holds   Overhead Y RTB for lower trap activation and scapulohumeral rhythm: 3x10  RTB ER: 2x15    PATIENT EDUCATION:   Education details: goals, POC, HEP Person educated: Patient Education method: Explanation, Demonstration, Tactile cues, and Verbal cues Education comprehension: verbalized understanding, returned demonstration, verbal cues required, and tactile cues required  HOME EXERCISE PROGRAM: Access Code: [email or web address] URL: [email or web address] Date: 08/24/2022 Prepared by: Precious Bard  Exercises - Supine Cervical Retraction with Towel  - 1 x daily - 7 x weekly - 2 sets - 10 reps - 5 hold - Seated Scapular Retraction  - 1 x daily - 7 x weekly - 2 sets - 10 reps - 5 hold - Supine Cervical Rotation AROM on Pillow  - 1 x daily - 7 x weekly - 2 sets - 10 reps - 5 hold - Neck Sidebending  - 1 x daily - 7 x weekly - 2 sets - 10 reps - 5 hold - Supine Cervical Flexion Extension on Pillow  - 1 x daily - 7 x weekly - 2 sets - 10 reps - 5 hold  ASSESSMENT:  CLINICAL IMPRESSION: Continuing PT POC with focus on manual techniques t improve cervical AROM and therex for improvement in postural strengthening. Pt remains with limited cervical rotation and side bending with taught muscular bands in B upper traps. Upper trap tension improved post manual intervention. Pt tolerating increased resistance of therex this date without onset of neck pain. Encouraged to continue and f/u with PT per POC. Pt will benefit from skilled physical therapy to reduce pain,improve ROM, and return to PLOF.  OBJECTIVE IMPAIRMENTS: decreased activity tolerance, decreased endurance, decreased ROM, decreased strength, hypomobility, increased muscle spasms, impaired flexibility, improper body mechanics, postural dysfunction, and pain.   ACTIVITY LIMITATIONS: carrying, lifting, sitting, standing, reach over head, and work  PARTICIPATION LIMITATIONS: meal prep, cleaning, laundry, driving, shopping, community activity, occupation, and yard work  PERSONAL FACTORS: Past/current experiences, Profession, Sex, Time since onset of  injury/illness/exacerbation, and 3+ comorbidities: anxiety, CAD, depression, arthritis of cervical region, gastritis, GERD, cancer, stroke, HLD, HTN, IB, lumbar  disc disease (fusion x2 ), migraine, numbness of L hand, headaches, and restless leg syndrome  are also affecting patient's functional outcome.   REHAB POTENTIAL: Fair    CLINICAL DECISION MAKING: Evolving/moderate complexity  EVALUATION COMPLEXITY: Moderate   GOALS: Goals reviewed with patient? Yes  SHORT TERM GOALS: Target date: 09/21/2022    Patient will be independent in home exercise program to improve strength/mobility for better functional independence with ADLs. Baseline: 3/27: given Goal status: INITIAL    LONG TERM GOALS: Target date: 10/19/2022    Patient will increase FOTO score to equal to or greater than  55   to demonstrate statistically significant improvement in mobility and quality of life.  Baseline: 45 Goal status: INITIAL  2.  Patient will reduce Neck Disability Index score to <10% to demonstrate minimal disability with ADL's including improved sleeping tolerance, sitting tolerance, etc for better mobility at home and work. Baseline: 42% Goal status: INITIAL  3.  Patient will improve cervical ROM by 20 degrees flexion, sidebending, and rotation for improved quality of life and ability to drive.  Baseline: 3/27: see above Goal status: INITIAL  4.  Patient will return to full work and recreational activities without compensation or pain increase of >3/10.  Baseline: 3/27: unable to perform  Goal status: INITIAL    PLAN:  PT FREQUENCY: 2x/week  PT DURATION: 8 weeks  PLANNED INTERVENTIONS: Therapeutic exercises, Therapeutic activity, Neuromuscular re-education, Balance training, Gait training, Patient/Family education, Self Care, Joint mobilization, Stair training, Vestibular training, Canalith repositioning, Dry Needling, Spinal mobilization, Cryotherapy, Moist heat, scar mobilization,  Traction, Ultrasound, Manual therapy, and Re-evaluation  PLAN FOR NEXT SESSION: upper trap release, cervical ROM increase, postural strengthening.    Delphia Grates. Fairly IV, PT, DPT Physical Therapist- [place]  [facility]  09/19/2022, 9:44 AM

## 2022-09-20 NOTE — Therapy (Signed)
OUTPATIENT PHYSICAL THERAPY CERVICAL TREATMENT   Patient Name: Lori Morales MRN: [id number] DOB:February 01, 1961, 62 y.o., female Today's Date: 09/21/2022  END OF SESSION:  PT End of Session - 09/21/22 0844     Visit Number 7    Number of Visits 16    Date for PT Re-Evaluation 10/19/22    Authorization Type [employer] employee [employer]    Authorization Time Period 08/24/22-10/19/22    Progress Note Due on Visit 10    PT Start Time 0845    PT Stop Time 0929    PT Time Calculation (min) 44 min    Activity Tolerance Patient tolerated treatment well;Patient limited by pain    Behavior During Therapy [facility] for tasks assessed/performed                Past Medical History:  Diagnosis Date   Arthritis    CAD (coronary artery disease), native coronary artery    mild non calcified plaque in mid LAD and prox LCx on coronary CTA 01/2019   Depression    GERD (gastroesophageal reflux disease)    Headache    migraines   Hx of squamous cell carcinoma 07/11/2016   Right distal dorsum lat forearm    Hypertension    IBS (irritable bowel syndrome)    Palpitations    PFO (patent foramen ovale)    small by cardiac CTA 01/2019   PONV (postoperative nausea and vomiting)    Restless legs syndrome    Squamous cell carcinoma of skin 12/202/2017   right distal dorsum lateral forearm   Past Surgical History:  Procedure Laterality Date   ABDOMINOPLASTY     BACK SURGERY     1 laminectomy, 1 fusion   COLONOSCOPY     COLONOSCOPY WITH PROPOFOL N/A 01/17/2017   Procedure: COLONOSCOPY WITH PROPOFOL;  Surgeon: Christena Deem, MD;  Location: [facility] ENDOSCOPY;  Service: Endoscopy;  Laterality: N/A;   WISDOM TOOTH EXTRACTION     Patient Active Problem List   Diagnosis Date Noted   PFO (patent foramen ovale)    CAD (coronary artery disease), native coronary artery    Acute CVA (cerebrovascular accident) 06/15/2018   Depression 10/16/2017   Migraine 10/16/2017   Hyperglycemia, unspecified 07/10/2017    Hyperglycemia 07/10/2017   H/O dizziness 08/02/2016   Cervical disc disorder with radiculopathy of cervical region 04/12/2016   Nonallopathic lesion of thoracic region 04/12/2016   Nonallopathic lesion of rib cage 04/12/2016   Facet arthritis of cervical region 03/21/2016   Hyperlipidemia 01/26/2015   Labral tear of shoulder, right, subsequent encounter 12/04/2014   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   Impingement syndrome of right shoulder 05/28/2014   Right supraspinatus tenosynovitis 05/28/2014   Arthritis pain 04/03/2014   Fatigue 04/03/2014   Cervical intraepithelial neoplasia grade 1 06/20/2012   IBS (irritable bowel syndrome) 06/20/2012   Lumbar disc disease 06/20/2012    PCP: Lynnea Ferrier MD  REFERRING PROVIDER: Brown,Christopher R MD  REFERRING DIAG: cervical radiculopathy  THERAPY DIAG:  Cervicalgia  Abnormal posture  Other abnormalities of gait and mobility  Rationale for Evaluation and Treatment: Rehabilitation  ONSET DATE: 06/14/22  SUBJECTIVE:  SUBJECTIVE STATEMENT: Patient reports her work has been very stressful. Has been having increased tension.   Hand dominance: Right  PERTINENT HISTORY:  Patient is returning to PT for s/p cervical fusion C5-7 with bilateral foraminotomies at C6-7 on 06/14/22. Patient has a history of C6-7 arthroplasty and has been treated by this clinician before. PMH includes anxiety, CAD, depression, arthritis of cervical region, gastritis, GERD, cancer, stroke, HLD, HTN, IB, lumbar disc disease (fusion x2 ), migraine, numbness of L hand, headaches, and restless leg syndrome.  Patient had posterior cervical fusion from C5-C7 and bilateral foraminotomies at C6-7 on 06/14/22; Only restriction is no lifting >15 lb  PAIN:  Are you having pain? Yes:  NPRS scale: 3/10 Pain location: lower cervical and across traps Pain description: aching Aggravating factors: positioning Relieving factors: positioning, heat  PRECAUTIONS: Cervical  WEIGHT BEARING RESTRICTIONS:  no lifting>15 lb   FALLS:  Has patient fallen in last 6 months? No  LIVING ENVIRONMENT: Lives with: lives with their spouse Lives in: House/apartment Stairs: Yes: External:   steps;     OCCUPATION: Chiropodist   PLOF: Independent  PATIENT GOALS: to have more range of motion  NEXT MD VISIT: July 3rd, 2024  OBJECTIVE:   DIAGNOSTIC FINDINGS:  IMPRESSION: 1. Anterior cervical fusion at C6-7. Susceptibility artifact partially obscures evaluation of the neural foramina and central canal. No significant right foraminal stenosis. Left uncovertebral degenerative changes with moderate left foraminal stenosis. 2. At C2-3 there is a minimal broad-based disc bulge. Moderate right facet arthropathy. Mild right foraminal stenosis. 3. At C3-4 there is a minimal broad-based disc bulge. Moderate right facet arthropathy. Mild left foraminal stenosis. 4. At C4-5 there is a minimal broad-based disc bulge. Mild right facet arthropathy. Moderate left foraminal stenosis. 5. At C7-T1 there is a minimal broad-based disc bulge. Mild right and moderate left foraminal stenosis.    IMPRESSION: 1. Anterior cervical fusion at C6-7. Susceptibility artifact partially obscures evaluation of the neural foramina and central canal. No significant right foraminal stenosis. Left uncovertebral degenerative changes with moderate left foraminal stenosis. 2. At C2-3 there is a minimal broad-based disc bulge. Moderate right facet arthropathy. Mild right foraminal stenosis. 3. At C3-4 there is a minimal broad-based disc bulge. Moderate right facet arthropathy. Mild left foraminal stenosis. 4. At C4-5 there is a minimal broad-based disc bulge. Mild right facet arthropathy. Moderate left  foraminal stenosis. 5. At C7-T1 there is a minimal broad-based disc bulge. Mild right and moderate left foraminal stenosis.  PATIENT SURVEYS:  NDI 42% FOTO 45  COGNITION: Overall cognitive status: Within functional limits for tasks assessed  SENSATION: Light touch: WFL; occasional R hand numbness with sleep   POSTURE: rounded shoulders  PALPATION: Hardware present, tenderness at C5-6, C6-7.    CERVICAL ROM:   Active ROM A/PROM (deg) eval  Flexion 24  Extension 25  Right lateral flexion 9  Left lateral flexion 19  Right rotation 18  Left rotation 19   (Blank rows = not tested)  UPPER EXTREMITY ROM:  Active ROM Right eval Left eval  Shoulder flexion [facility]  Shoulder extension    Shoulder abduction 170 140   (Blank rows = not tested)  UPPER EXTREMITY MMT:  MMT Right eval Left eval  Shoulder flexion 4+ 4+  Shoulder extension 4+ 4+  Shoulder abduction 4+ 4+  Shoulder adduction 4+ 4+  Shoulder extension 4+ 4+  Elbow flexion 4+ 4+  Elbow extension 4+ 4+   (Blank rows = not tested)  TODAY'S TREATMENT:                                                                                                                              DATE: 09/21/22  Manual Therapy: B UT with cervical paraspinals release in seated . x8 minutes with implementation of effleurage and ptrissage to improve cervical AROM.   B UT stretch with OP at GHJ: 3x30 sec/side    There.Ex: UBE: level 4; 2:30 minutes forward, 2:30 minutes backwards.  Hook lying: AAROM shoulder flexion overhead with PVC pipe for pec stretch, thoracic extension, and scapulohumeral rhythm. 2x12  Chin tuck: 2x12  Cervical rotation: x12/side, 2-3 sec holds   Overhead Y RTB for lower trap activation and scapulohumeral rhythm: 3x10  Arnold to [employer] stretch 10x; hold 10 second.     PATIENT EDUCATION:  Education details: goals, POC, HEP Person educated: Patient Education method: Explanation, Demonstration,  Tactile cues, and Verbal cues Education comprehension: verbalized understanding, returned demonstration, verbal cues required, and tactile cues required  HOME EXERCISE PROGRAM: Access Code: [email or web address] URL: [email or web address] Date: 08/24/2022 Prepared by: Precious Bard  Exercises - Supine Cervical Retraction with Towel  - 1 x daily - 7 x weekly - 2 sets - 10 reps - 5 hold - Seated Scapular Retraction  - 1 x daily - 7 x weekly - 2 sets - 10 reps - 5 hold - Supine Cervical Rotation AROM on Pillow  - 1 x daily - 7 x weekly - 2 sets - 10 reps - 5 hold - Neck Sidebending  - 1 x daily - 7 x weekly - 2 sets - 10 reps - 5 hold - Supine Cervical Flexion Extension on Pillow  - 1 x daily - 7 x weekly - 2 sets - 10 reps - 5 hold  ASSESSMENT:  CLINICAL IMPRESSION: Patient has increased muscle tension from carryover of increased stress at work. Patient educated on importance of positioning for reduced tension and improved alignment. Patient eager to progress her pain free mobility. UE ROM tolerated well.  Pt will benefit from skilled physical therapy to reduce pain,improve ROM, and return to PLOF.  OBJECTIVE IMPAIRMENTS: decreased activity tolerance, decreased endurance, decreased ROM, decreased strength, hypomobility, increased muscle spasms, impaired flexibility, improper body mechanics, postural dysfunction, and pain.   ACTIVITY LIMITATIONS: carrying, lifting, sitting, standing, reach over head, and work  PARTICIPATION LIMITATIONS: meal prep, cleaning, laundry, driving, shopping, community activity, occupation, and yard work  PERSONAL FACTORS: Past/current experiences, Catering manager, Sex, Time since onset of injury/illness/exacerbation, and 3+ comorbidities: anxiety, CAD, depression, arthritis of cervical region, gastritis, GERD, cancer, stroke, HLD, HTN, IB, lumbar disc disease (fusion x2 ), migraine, numbness of L hand, headaches, and restless leg syndrome  are also affecting patient's  functional outcome.   REHAB POTENTIAL: Fair    CLINICAL DECISION MAKING: Evolving/moderate complexity  EVALUATION COMPLEXITY: Moderate   GOALS: Goals reviewed with patient? Yes  SHORT TERM GOALS: Target date: 09/21/2022    Patient  will be independent in home exercise program to improve strength/mobility for better functional independence with ADLs. Baseline: 3/27: given Goal status: INITIAL    LONG TERM GOALS: Target date: 10/19/2022    Patient will increase FOTO score to equal to or greater than  55   to demonstrate statistically significant improvement in mobility and quality of life.  Baseline: 45 Goal status: INITIAL  2.  Patient will reduce Neck Disability Index score to <10% to demonstrate minimal disability with ADL's including improved sleeping tolerance, sitting tolerance, etc for better mobility at home and work. Baseline: 42% Goal status: INITIAL  3.  Patient will improve cervical ROM by 20 degrees flexion, sidebending, and rotation for improved quality of life and ability to drive.  Baseline: 3/27: see above Goal status: INITIAL  4.  Patient will return to full work and recreational activities without compensation or pain increase of >3/10.  Baseline: 3/27: unable to perform  Goal status: INITIAL    PLAN:  PT FREQUENCY: 2x/week  PT DURATION: 8 weeks  PLANNED INTERVENTIONS: Therapeutic exercises, Therapeutic activity, Neuromuscular re-education, Balance training, Gait training, Patient/Family education, Self Care, Joint mobilization, Stair training, Vestibular training, Canalith repositioning, Dry Needling, Spinal mobilization, Cryotherapy, Moist heat, scar mobilization, Traction, Ultrasound, Manual therapy, and Re-evaluation  PLAN FOR NEXT SESSION: upper trap release, cervical ROM increase, postural strengthening.   Precious Bard PT  Physical Therapist- [place]  [facility]  09/21/2022, 9:30 AM

## 2022-09-21 ENCOUNTER — Ambulatory Visit: Payer: Commercial Managed Care - PPO

## 2022-09-21 DIAGNOSIS — M542 Cervicalgia: Secondary | ICD-10-CM

## 2022-09-21 DIAGNOSIS — R2689 Other abnormalities of gait and mobility: Secondary | ICD-10-CM

## 2022-09-21 DIAGNOSIS — R293 Abnormal posture: Secondary | ICD-10-CM

## 2022-09-21 DIAGNOSIS — M533 Sacrococcygeal disorders, not elsewhere classified: Secondary | ICD-10-CM | POA: Diagnosis not present

## 2022-09-22 NOTE — Therapy (Signed)
OUTPATIENT PHYSICAL THERAPY CERVICAL TREATMENT   Patient Name: Lori Morales MRN: [id number] DOB:[birth year], 62 y.o., female Today's Date: 09/26/2022  END OF SESSION:  PT End of Session - 09/26/22 0847     Visit Number 8    Number of Visits 16    Date for PT Re-Evaluation 10/19/22    Authorization Type [employer] employee [employer]    Authorization Time Period 08/24/22-10/19/22    Progress Note Due on Visit 10    PT Start Time 0845    PT Stop Time 0929    PT Time Calculation (min) 44 min    Activity Tolerance Patient tolerated treatment well;Patient limited by pain    Behavior During Therapy [facility] for tasks assessed/performed                 Past Medical History:  Diagnosis Date   Arthritis    CAD (coronary artery disease), native coronary artery    mild non calcified plaque in mid LAD and prox LCx on coronary CTA 01/2019   Depression    GERD (gastroesophageal reflux disease)    Headache    migraines   Hx of squamous cell carcinoma 07/11/2016   Right distal dorsum lat forearm    Hypertension    IBS (irritable bowel syndrome)    Palpitations    PFO (patent foramen ovale)    small by cardiac CTA 01/2019   PONV (postoperative nausea and vomiting)    Restless legs syndrome    Squamous cell carcinoma of skin 12/202/2017   right distal dorsum lateral forearm   Past Surgical History:  Procedure Laterality Date   ABDOMINOPLASTY     BACK SURGERY     1 laminectomy, 1 fusion   COLONOSCOPY     COLONOSCOPY WITH PROPOFOL N/A 01/17/2017   Procedure: COLONOSCOPY WITH PROPOFOL;  Surgeon: Christena Deem, MD;  Location: [facility] ENDOSCOPY;  Service: Endoscopy;  Laterality: N/A;   WISDOM TOOTH EXTRACTION     Patient Active Problem List   Diagnosis Date Noted   PFO (patent foramen ovale)    CAD (coronary artery disease), native coronary artery    Acute CVA (cerebrovascular accident) (HCC) 06/15/2018   Depression 10/16/2017   Migraine 10/16/2017   Hyperglycemia, unspecified 07/10/2017    Hyperglycemia 07/10/2017   H/O dizziness 08/02/2016   Cervical disc disorder with radiculopathy of cervical region 04/12/2016   Nonallopathic lesion of thoracic region 04/12/2016   Nonallopathic lesion of rib cage 04/12/2016   Facet arthritis of cervical region 03/21/2016   Hyperlipidemia 01/26/2015   Labral tear of shoulder, right, subsequent encounter 12/04/2014   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   Impingement syndrome of right shoulder 05/28/2014   Right supraspinatus tenosynovitis 05/28/2014   Arthritis pain 04/03/2014   Fatigue 04/03/2014   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 Continuing to have back pain.   Hand dominance: Right  PERTINENT HISTORY:  Patient is returning to PT for s/p cervical fusion C5-7 with bilateral foraminotomies at C6-7 on 06/14/22. Patient has a history of C6-7 arthroplasty and has been treated by this clinician before. PMH includes anxiety, CAD, depression, arthritis of cervical region, gastritis, GERD, cancer, stroke, HLD, HTN, IB, lumbar disc disease (fusion x2 ), migraine, numbness of L hand, headaches, and restless leg syndrome.  Patient had posterior cervical fusion from C5-C7 and bilateral foraminotomies at C6-7 on 06/14/22; Only restriction is no lifting >15 lb  PAIN:  Are you having pain? Yes: NPRS scale:  3/10 Pain location: lower cervical and across traps Pain description: aching Aggravating factors: positioning Relieving factors: positioning, heat  PRECAUTIONS: Cervical  WEIGHT BEARING RESTRICTIONS:  no lifting>15 lb   FALLS:  Has patient fallen in last 6 months? No  LIVING ENVIRONMENT: Lives with: lives with their spouse Lives in: House/apartment Stairs: Yes: External:   steps;     OCCUPATION: Chiropodist   PLOF: Independent  PATIENT GOALS: to have more range of motion  NEXT MD VISIT: July 3rd, 2024  OBJECTIVE:   DIAGNOSTIC FINDINGS:  IMPRESSION: 1. Anterior cervical fusion at C6-7. Susceptibility artifact partially obscures evaluation of the neural foramina and central canal. No significant right foraminal stenosis. Left uncovertebral degenerative changes with moderate left foraminal stenosis. 2. At C2-3 there is a minimal broad-based disc bulge. Moderate right facet arthropathy. Mild right foraminal stenosis. 3. At C3-4 there is a minimal broad-based disc bulge. Moderate right facet arthropathy. Mild left foraminal stenosis. 4. At C4-5 there is a minimal broad-based disc bulge. Mild right facet arthropathy. Moderate left foraminal stenosis. 5. At C7-T1 there is a minimal broad-based disc bulge. Mild right and moderate left foraminal stenosis.    IMPRESSION: 1. Anterior cervical fusion at C6-7. Susceptibility artifact partially obscures evaluation of the neural foramina and central canal. No significant right foraminal stenosis. Left uncovertebral degenerative changes with moderate left foraminal stenosis. 2. At C2-3 there is a minimal broad-based disc bulge. Moderate right facet arthropathy. Mild right foraminal stenosis. 3. At C3-4 there is a minimal broad-based disc bulge. Moderate right facet arthropathy. Mild left foraminal stenosis. 4. At C4-5 there is a minimal broad-based disc bulge. Mild right facet arthropathy. Moderate left foraminal  stenosis. 5. At C7-T1 there is a minimal broad-based disc bulge. Mild right and moderate left foraminal stenosis.  PATIENT SURVEYS:  NDI 42% FOTO 45  COGNITION: Overall cognitive status: Within functional limits for tasks assessed  SENSATION: Light touch: WFL; occasional R hand numbness with sleep   POSTURE: rounded shoulders  PALPATION: Hardware present, tenderness at C5-6, C6-7.    CERVICAL ROM:   Active ROM A/PROM (deg) eval  Flexion 24  Extension 25  Right lateral flexion 9  Left lateral flexion 19  Right rotation 18  Left rotation 19   (Blank rows = not tested)  UPPER EXTREMITY ROM:  Active ROM Right eval Left eval  Shoulder flexion [facility]  Shoulder extension    Shoulder abduction 170 140   (Blank rows = not tested)  UPPER EXTREMITY MMT:  MMT Right eval Left eval  Shoulder flexion 4+ 4+  Shoulder extension 4+ 4+  Shoulder abduction 4+ 4+  Shoulder adduction 4+ 4+  Shoulder extension 4+ 4+  Elbow flexion 4+ 4+  Elbow extension 4+ 4+   (Blank rows = not tested)    TODAY'S TREATMENT:  DATE: 09/26/22  Manual Therapy: B UT with cervical paraspinals release in seated . x8 minutes with implementation of effleurage and ptrissage to improve cervical AROM.   B UT stretch with OP at GHJ: 3x30 sec/side  There.Ex: UBE: level 4; 2:30 minutes forward, 2:30 minutes backwards.  Chin tuck: 2x12  Cervical rotation: x12/side, 2-3 sec holds   Overhead Y RTB for lower trap activation and scapulohumeral rhythm: 3x10  Arnold to [employer] stretch 10x; hold 10 second.   Back screen: Hip testing: negative Piriformis tightness: postitive SI joint screen : negative CPA L4-5, L1-2; painful; UPA nonpainful Piriformis stretch seated x 2 x 30 seconds Posterior pelvic tilt 10x   PATIENT EDUCATION:  Education details: goals, POC, HEP Person  educated: Patient Education method: Explanation, Demonstration, Tactile cues, and Verbal cues Education comprehension: verbalized understanding, returned demonstration, verbal cues required, and tactile cues required  HOME EXERCISE PROGRAM: Access Code: [email or web address] URL: [email or web address] Date: 08/24/2022 Prepared by: Precious Bard  Exercises - Supine Cervical Retraction with Towel  - 1 x daily - 7 x weekly - 2 sets - 10 reps - 5 hold - Seated Scapular Retraction  - 1 x daily - 7 x weekly - 2 sets - 10 reps - 5 hold - Supine Cervical Rotation AROM on Pillow  - 1 x daily - 7 x weekly - 2 sets - 10 reps - 5 hold - Neck Sidebending  - 1 x daily - 7 x weekly - 2 sets - 10 reps - 5 hold - Supine Cervical Flexion Extension on Pillow  - 1 x daily - 7 x weekly - 2 sets - 10 reps - 5 hold  ASSESSMENT:  CLINICAL IMPRESSION:  Patient back screen, educated on need to follow up with physician due to excessive tenderness with CPA at L4-5 and L1-2;  Patient educated on piriformis stretching and posterior pelvic tilt. Patient agreeable to discuss new onset of pain with physician. Pt will benefit from skilled physical therapy to reduce pain,improve ROM, and return to PLOF.  OBJECTIVE IMPAIRMENTS: decreased activity tolerance, decreased endurance, decreased ROM, decreased strength, hypomobility, increased muscle spasms, impaired flexibility, improper body mechanics, postural dysfunction, and pain.   ACTIVITY LIMITATIONS: carrying, lifting, sitting, standing, reach over head, and work  PARTICIPATION LIMITATIONS: meal prep, cleaning, laundry, driving, shopping, community activity, occupation, and yard work  PERSONAL FACTORS: Past/current experiences, Catering manager, Sex, Time since onset of injury/illness/exacerbation, and 3+ comorbidities: anxiety, CAD, depression, arthritis of cervical region, gastritis, GERD, cancer, stroke, HLD, HTN, IB, lumbar disc disease (fusion x2 ), migraine, numbness of L  hand, headaches, and restless leg syndrome  are also affecting patient's functional outcome.   REHAB POTENTIAL: Fair    CLINICAL DECISION MAKING: Evolving/moderate complexity  EVALUATION COMPLEXITY: Moderate   GOALS: Goals reviewed with patient? Yes  SHORT TERM GOALS: Target date: 09/21/2022    Patient will be independent in home exercise program to improve strength/mobility for better functional independence with ADLs. Baseline: 3/27: given Goal status: INITIAL    LONG TERM GOALS: Target date: 10/19/2022    Patient will increase FOTO score to equal to or greater than  55   to demonstrate statistically significant improvement in mobility and quality of life.  Baseline: 45 Goal status: INITIAL  2.  Patient will reduce Neck Disability Index score to <10% to demonstrate minimal disability with ADL's including improved sleeping tolerance, sitting tolerance, etc for better mobility at home and work. Baseline: 42% Goal status: INITIAL  3.  Patient will improve cervical ROM by  20 degrees flexion, sidebending, and rotation for improved quality of life and ability to drive.  Baseline: 3/27: see above Goal status: INITIAL  4.  Patient will return to full work and recreational activities without compensation or pain increase of >3/10.  Baseline: 3/27: unable to perform  Goal status: INITIAL    PLAN:  PT FREQUENCY: 2x/week  PT DURATION: 8 weeks  PLANNED INTERVENTIONS: Therapeutic exercises, Therapeutic activity, Neuromuscular re-education, Balance training, Gait training, Patient/Family education, Self Care, Joint mobilization, Stair training, Vestibular training, Canalith repositioning, Dry Needling, Spinal mobilization, Cryotherapy, Moist heat, scar mobilization, Traction, Ultrasound, Manual therapy, and Re-evaluation  PLAN FOR NEXT SESSION: upper trap release, cervical ROM increase, postural strengthening.   Precious Bard PT  Physical Therapist- [place]  [facility]  09/26/2022, 10:02 AM

## 2022-09-26 ENCOUNTER — Ambulatory Visit: Payer: Commercial Managed Care - PPO

## 2022-09-26 DIAGNOSIS — M542 Cervicalgia: Secondary | ICD-10-CM

## 2022-09-26 DIAGNOSIS — R2689 Other abnormalities of gait and mobility: Secondary | ICD-10-CM | POA: Diagnosis not present

## 2022-09-26 DIAGNOSIS — R293 Abnormal posture: Secondary | ICD-10-CM

## 2022-09-26 DIAGNOSIS — M533 Sacrococcygeal disorders, not elsewhere classified: Secondary | ICD-10-CM | POA: Diagnosis not present

## 2022-09-27 NOTE — Therapy (Signed)
OUTPATIENT PHYSICAL THERAPY CERVICAL TREATMENT   Patient Name: Lori Morales MRN: [id number] DOB:Jun 04, 1960, 62 y.o., female Today's Date: 09/28/2022  END OF SESSION:  PT End of Session - 09/28/22 0717     Visit Number 9    Number of Visits 16    Date for PT Re-Evaluation 10/19/22    Authorization Type [employer] employee [employer]    Authorization Time Period 08/24/22-10/19/22    Progress Note Due on Visit 10    PT Start Time 0715    PT Stop Time 0759    PT Time Calculation (min) 44 min    Activity Tolerance Patient tolerated treatment well;Patient limited by pain    Behavior During Therapy [facility] for tasks assessed/performed                 Past Medical History:  Diagnosis Date   Arthritis    CAD (coronary artery disease), native coronary artery    mild non calcified plaque in mid LAD and prox LCx on coronary CTA 01/2019   Depression    GERD (gastroesophageal reflux disease)    Headache    migraines   Hx of squamous cell carcinoma 07/11/2016   Right distal dorsum lat forearm    Hypertension    IBS (irritable bowel syndrome)    Palpitations    PFO (patent foramen ovale)    small by cardiac CTA 01/2019   PONV (postoperative nausea and vomiting)    Restless legs syndrome    Squamous cell carcinoma of skin 12/202/2017   right distal dorsum lateral forearm   Past Surgical History:  Procedure Laterality Date   ABDOMINOPLASTY     BACK SURGERY     1 laminectomy, 1 fusion   COLONOSCOPY     COLONOSCOPY WITH PROPOFOL N/A 01/17/2017   Procedure: COLONOSCOPY WITH PROPOFOL;  Surgeon: Christena Deem, MD;  Location: [facility] ENDOSCOPY;  Service: Endoscopy;  Laterality: N/A;   WISDOM TOOTH EXTRACTION     Patient Active Problem List   Diagnosis Date Noted   PFO (patent foramen ovale)    CAD (coronary artery disease), native coronary artery    Acute CVA (cerebrovascular accident) (HCC) 06/15/2018   Depression 10/16/2017   Migraine 10/16/2017   Hyperglycemia, unspecified 07/10/2017    Hyperglycemia 07/10/2017   H/O dizziness 08/02/2016   Cervical disc disorder with radiculopathy of cervical region 04/12/2016   Nonallopathic lesion of thoracic region 04/12/2016   Nonallopathic lesion of rib cage 04/12/2016   Facet arthritis of cervical region 03/21/2016   Hyperlipidemia 01/26/2015   Labral tear of shoulder, right, subsequent encounter 12/04/2014   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   Impingement syndrome of right shoulder 05/28/2014   Right supraspinatus tenosynovitis 05/28/2014   Arthritis pain 04/03/2014   Fatigue 04/03/2014   Cervical intraepithelial neoplasia grade 1 06/20/2012   IBS (irritable bowel syndrome) 06/20/2012   Lumbar disc disease 06/20/2012    PCP: Lynnea Ferrier MD  REFERRING PROVIDER: Brown,Christopher R MD  REFERRING DIAG: cervical radiculopathy  THERAPY DIAG:  Cervicalgia  Abnormal posture  Other abnormalities of gait and mobility  Rationale for Evaluation and Treatment: Rehabilitation  ONSET DATE: 06/14/22  SUBJECTIVE:  SUBJECTIVE STATEMENT: Patient has open house at work tonight. Sees a doctor tomorrow for her new onset of low back pain.   Hand dominance: Right  PERTINENT HISTORY:  Patient is returning to PT for s/p cervical fusion C5-7 with bilateral foraminotomies at C6-7 on 06/14/22. Patient has a history of C6-7 arthroplasty and has been treated by this clinician before. PMH includes anxiety, CAD, depression, arthritis of cervical region, gastritis, GERD, cancer, stroke, HLD, HTN, IB, lumbar disc disease (fusion x2 ), migraine, numbness of L hand, headaches, and restless leg syndrome.  Patient had posterior cervical fusion from C5-C7 and bilateral foraminotomies at C6-7 on 06/14/22; Only restriction is no lifting >15 lb  PAIN:  Are you having  pain? Yes: NPRS scale: 3/10 Pain location: lower cervical and across traps Pain description: aching Aggravating factors: positioning Relieving factors: positioning, heat  PRECAUTIONS: Cervical  WEIGHT BEARING RESTRICTIONS:  no lifting>15 lb   FALLS:  Has patient fallen in last 6 months? No  LIVING ENVIRONMENT: Lives with: lives with their spouse Lives in: House/apartment Stairs: Yes: External:   steps;     OCCUPATION: Chiropodist   PLOF: Independent  PATIENT GOALS: to have more range of motion  NEXT MD VISIT: July 3rd, 2024  OBJECTIVE:   DIAGNOSTIC FINDINGS:  IMPRESSION: 1. Anterior cervical fusion at C6-7. Susceptibility artifact partially obscures evaluation of the neural foramina and central canal. No significant right foraminal stenosis. Left uncovertebral degenerative changes with moderate left foraminal stenosis. 2. At C2-3 there is a minimal broad-based disc bulge. Moderate right facet arthropathy. Mild right foraminal stenosis. 3. At C3-4 there is a minimal broad-based disc bulge. Moderate right facet arthropathy. Mild left foraminal stenosis. 4. At C4-5 there is a minimal broad-based disc bulge. Mild right facet arthropathy. Moderate left foraminal stenosis. 5. At C7-T1 there is a minimal broad-based disc bulge. Mild right and moderate left foraminal stenosis.    IMPRESSION: 1. Anterior cervical fusion at C6-7. Susceptibility artifact partially obscures evaluation of the neural foramina and central canal. No significant right foraminal stenosis. Left uncovertebral degenerative changes with moderate left foraminal stenosis. 2. At C2-3 there is a minimal broad-based disc bulge. Moderate right facet arthropathy. Mild right foraminal stenosis. 3. At C3-4 there is a minimal broad-based disc bulge. Moderate right facet arthropathy. Mild left foraminal stenosis. 4. At C4-5 there is a minimal broad-based disc bulge. Mild right facet arthropathy. Moderate  left foraminal stenosis. 5. At C7-T1 there is a minimal broad-based disc bulge. Mild right and moderate left foraminal stenosis.  PATIENT SURVEYS:  NDI 42% FOTO 45  COGNITION: Overall cognitive status: Within functional limits for tasks assessed  SENSATION: Light touch: WFL; occasional R hand numbness with sleep   POSTURE: rounded shoulders  PALPATION: Hardware present, tenderness at C5-6, C6-7.    CERVICAL ROM:   Active ROM A/PROM (deg) eval  Flexion 24  Extension 25  Right lateral flexion 9  Left lateral flexion 19  Right rotation 18  Left rotation 19   (Blank rows = not tested)  UPPER EXTREMITY ROM:  Active ROM Right eval Left eval  Shoulder flexion [facility]  Shoulder extension    Shoulder abduction 170 140   (Blank rows = not tested)  UPPER EXTREMITY MMT:  MMT Right eval Left eval  Shoulder flexion 4+ 4+  Shoulder extension 4+ 4+  Shoulder abduction 4+ 4+  Shoulder adduction 4+ 4+  Shoulder extension 4+ 4+  Elbow flexion 4+ 4+  Elbow extension 4+ 4+   (Blank rows =  not tested)    TODAY'S TREATMENT:                                                                                                                              DATE: 09/28/22  Manual Therapy: B UT with cervical paraspinals release in seated . x8 minutes with implementation of effleurage and ptrissage to improve cervical AROM.   B UT stretch with OP at GHJ: 3x30 sec/side    There.Ex: UBE: level 4; 2:30 minutes forward, 2:30 minutes backwards. Supine:  Chin tuck: 2x12 Cervical rotation: x12/side, 2-3 sec holds  Overhead Y RTB for lower trap activation and scapulohumeral rhythm: 3x10 Cervical extension 10x   Seated: Cervical rotation 10x each side Cervical side bend with tactile cue to shoulder 10x each side Shoulder rolls   PATIENT EDUCATION:  Education details: goals, POC, HEP Person educated: Patient Education method: Explanation, Demonstration, Tactile cues, and Verbal  cues Education comprehension: verbalized understanding, returned demonstration, verbal cues required, and tactile cues required  HOME EXERCISE PROGRAM: Access Code: [email or web address] URL: [email or web address] Date: 08/24/2022 Prepared by: Precious Bard  Exercises - Supine Cervical Retraction with Towel  - 1 x daily - 7 x weekly - 2 sets - 10 reps - 5 hold - Seated Scapular Retraction  - 1 x daily - 7 x weekly - 2 sets - 10 reps - 5 hold - Supine Cervical Rotation AROM on Pillow  - 1 x daily - 7 x weekly - 2 sets - 10 reps - 5 hold - Neck Sidebending  - 1 x daily - 7 x weekly - 2 sets - 10 reps - 5 hold - Supine Cervical Flexion Extension on Pillow  - 1 x daily - 7 x weekly - 2 sets - 10 reps - 5 hold  ASSESSMENT:  CLINICAL IMPRESSION: Patient has increased tension that improved by end of session. Education on postural stabilization. Patient tolerates progression to seated ROM interventions which is added to POC for home. Patient is able to maintain neutral shoulder alignment when moving cervical spine with minimal tactile cueing.  Pt will benefit from skilled physical therapy to reduce pain,improve ROM, and return to PLOF.  OBJECTIVE IMPAIRMENTS: decreased activity tolerance, decreased endurance, decreased ROM, decreased strength, hypomobility, increased muscle spasms, impaired flexibility, improper body mechanics, postural dysfunction, and pain.   ACTIVITY LIMITATIONS: carrying, lifting, sitting, standing, reach over head, and work  PARTICIPATION LIMITATIONS: meal prep, cleaning, laundry, driving, shopping, community activity, occupation, and yard work  PERSONAL FACTORS: Past/current experiences, Catering manager, Sex, Time since onset of injury/illness/exacerbation, and 3+ comorbidities: anxiety, CAD, depression, arthritis of cervical region, gastritis, GERD, cancer, stroke, HLD, HTN, IB, lumbar disc disease (fusion x2 ), migraine, numbness of L hand, headaches, and restless leg syndrome   are also affecting patient's functional outcome.   REHAB POTENTIAL: Fair    CLINICAL DECISION MAKING: Evolving/moderate complexity  EVALUATION COMPLEXITY: Moderate   GOALS: Goals reviewed with patient? Yes  SHORT TERM GOALS: Target  date: 09/21/2022    Patient will be independent in home exercise program to improve strength/mobility for better functional independence with ADLs. Baseline: 3/27: given Goal status: INITIAL    LONG TERM GOALS: Target date: 10/19/2022    Patient will increase FOTO score to equal to or greater than  55   to demonstrate statistically significant improvement in mobility and quality of life.  Baseline: 45 Goal status: INITIAL  2.  Patient will reduce Neck Disability Index score to <10% to demonstrate minimal disability with ADL's including improved sleeping tolerance, sitting tolerance, etc for better mobility at home and work. Baseline: 42% Goal status: INITIAL  3.  Patient will improve cervical ROM by 20 degrees flexion, sidebending, and rotation for improved quality of life and ability to drive.  Baseline: 3/27: see above Goal status: INITIAL  4.  Patient will return to full work and recreational activities without compensation or pain increase of >3/10.  Baseline: 3/27: unable to perform  Goal status: INITIAL    PLAN:  PT FREQUENCY: 2x/week  PT DURATION: 8 weeks  PLANNED INTERVENTIONS: Therapeutic exercises, Therapeutic activity, Neuromuscular re-education, Balance training, Gait training, Patient/Family education, Self Care, Joint mobilization, Stair training, Vestibular training, Canalith repositioning, Dry Needling, Spinal mobilization, Cryotherapy, Moist heat, scar mobilization, Traction, Ultrasound, Manual therapy, and Re-evaluation  PLAN FOR NEXT SESSION: upper trap release, cervical ROM increase, postural strengthening.   Precious Bard PT  Physical Therapist- [place]  [facility]  09/28/2022, 8:02 AM

## 2022-09-28 ENCOUNTER — Ambulatory Visit: Payer: Commercial Managed Care - PPO | Attending: Orthopedic Surgery

## 2022-09-28 DIAGNOSIS — M542 Cervicalgia: Secondary | ICD-10-CM | POA: Insufficient documentation

## 2022-09-28 DIAGNOSIS — R2689 Other abnormalities of gait and mobility: Secondary | ICD-10-CM | POA: Diagnosis not present

## 2022-09-28 DIAGNOSIS — R293 Abnormal posture: Secondary | ICD-10-CM | POA: Diagnosis not present

## 2022-09-29 ENCOUNTER — Other Ambulatory Visit: Payer: Self-pay

## 2022-09-29 DIAGNOSIS — Z8673 Personal history of transient ischemic attack (TIA), and cerebral infarction without residual deficits: Secondary | ICD-10-CM | POA: Diagnosis not present

## 2022-09-29 DIAGNOSIS — I10 Essential (primary) hypertension: Secondary | ICD-10-CM | POA: Diagnosis not present

## 2022-09-29 DIAGNOSIS — M519 Unspecified thoracic, thoracolumbar and lumbosacral intervertebral disc disorder: Secondary | ICD-10-CM | POA: Diagnosis not present

## 2022-09-29 DIAGNOSIS — M47816 Spondylosis without myelopathy or radiculopathy, lumbar region: Secondary | ICD-10-CM | POA: Diagnosis not present

## 2022-09-29 DIAGNOSIS — I2510 Atherosclerotic heart disease of native coronary artery without angina pectoris: Secondary | ICD-10-CM | POA: Diagnosis not present

## 2022-09-29 MED ORDER — PREDNISONE 10 MG PO TABS
ORAL_TABLET | ORAL | 0 refills | Status: DC
  Filled 2022-09-29: qty 20, 8d supply, fill #0

## 2022-09-30 ENCOUNTER — Other Ambulatory Visit: Payer: Self-pay

## 2022-09-30 MED ORDER — HYDROCODONE-ACETAMINOPHEN 5-325 MG PO TABS
1.0000 | ORAL_TABLET | Freq: Four times a day (QID) | ORAL | 0 refills | Status: DC | PRN
  Filled 2022-09-30: qty 20, 5d supply, fill #0

## 2022-10-03 NOTE — Anesthesia Preprocedure Evaluation (Addendum)
Anesthesia Evaluation  Patient identified by MRN, date of birth, ID band Patient awake    Reviewed: Allergy & Precautions, H&P , NPO status , Patient's Chart, lab work & pertinent test results  Airway Mallampati: I  TM Distance: >3 FB Neck ROM: Full    Dental no notable dental hx.    Pulmonary neg pulmonary ROS   Pulmonary exam normal breath sounds clear to auscultation       Cardiovascular hypertension, negative cardio ROS Normal cardiovascular exam Rhythm:Regular Rate:Normal     Neuro/Psych negative neurological ROS  negative psych ROS   GI/Hepatic negative GI ROS, Neg liver ROS,,,  Endo/Other  negative endocrine ROS    Renal/GU negative Renal ROS  negative genitourinary   Musculoskeletal negative musculoskeletal ROS (+)    Abdominal   Peds negative pediatric ROS (+)  Hematology negative hematology ROS (+)   Anesthesia Other Findings Headache Depression  Restless legs syndrome IBS (irritable bowel syndrome)  Palpitations GERD (gastroesophageal reflux disease) Arthritis PONV (postoperative nausea and vomiting) Hypertension Hx of squamous cell carcinoma  PFO (patent foramen ovale) CAD (coronary artery disease),  native coronary artery  Squamous cell carcinoma of skin    Reproductive/Obstetrics negative OB ROS                             Anesthesia Physical Anesthesia Plan  ASA: 3  Anesthesia Plan: General   Post-op Pain Management:    Induction: Intravenous  PONV Risk Score and Plan:   Airway Management Planned: Natural Airway and Nasal Cannula  Additional Equipment:   Intra-op Plan:   Post-operative Plan:   Informed Consent: I have reviewed the patients History and Physical, chart, labs and discussed the procedure including the risks, benefits and alternatives for the proposed anesthesia with the patient or authorized representative who has indicated his/her  understanding and acceptance.     Dental Advisory Given  Plan Discussed with: Anesthesiologist, CRNA and Surgeon  Anesthesia Plan Comments: (Patient consented for risks of anesthesia including but not limited to:  - adverse reactions to medications - risk of airway placement if required - damage to eyes, teeth, lips or other oral mucosa - nerve damage due to positioning  - sore throat or hoarseness - Damage to heart, brain, nerves, lungs, other parts of body or loss of life  Patient voiced understanding.)       Anesthesia Quick Evaluation

## 2022-10-07 ENCOUNTER — Ambulatory Visit: Payer: Commercial Managed Care - PPO | Admitting: Anesthesiology

## 2022-10-07 ENCOUNTER — Other Ambulatory Visit: Payer: Self-pay

## 2022-10-07 ENCOUNTER — Ambulatory Visit
Admission: RE | Admit: 2022-10-07 | Discharge: 2022-10-07 | Disposition: A | Discharge status: Home / Self Care | Payer: Commercial Managed Care - PPO | Attending: Gastroenterology | Admitting: Gastroenterology

## 2022-10-07 ENCOUNTER — Encounter: Payer: Self-pay | Admitting: Gastroenterology

## 2022-10-07 ENCOUNTER — Encounter
Admission: RE | Disposition: A | Discharge status: Home / Self Care | Payer: Self-pay | Source: Home / Self Care | Attending: Gastroenterology

## 2022-10-07 DIAGNOSIS — K640 First degree hemorrhoids: Secondary | ICD-10-CM | POA: Diagnosis not present

## 2022-10-07 DIAGNOSIS — I10 Essential (primary) hypertension: Secondary | ICD-10-CM | POA: Diagnosis not present

## 2022-10-07 DIAGNOSIS — Z860100 Personal history of colon polyps, unspecified: Secondary | ICD-10-CM

## 2022-10-07 DIAGNOSIS — Z1211 Encounter for screening for malignant neoplasm of colon: Secondary | ICD-10-CM | POA: Insufficient documentation

## 2022-10-07 DIAGNOSIS — Z83719 Family history of colon polyps, unspecified: Secondary | ICD-10-CM

## 2022-10-07 HISTORY — PX: COLONOSCOPY WITH PROPOFOL: SHX5780

## 2022-10-07 SURGERY — COLONOSCOPY WITH PROPOFOL
Anesthesia: General | Site: Rectum

## 2022-10-07 MED ORDER — STERILE WATER FOR IRRIGATION IR SOLN
Status: DC | PRN
  Administered 2022-10-07: 150 mL

## 2022-10-07 MED ORDER — LIDOCAINE HCL (CARDIAC) PF 100 MG/5ML IV SOSY
PREFILLED_SYRINGE | INTRAVENOUS | Status: DC | PRN
  Administered 2022-10-07: 40 mg via INTRATRACHEAL

## 2022-10-07 MED ORDER — SODIUM CHLORIDE 0.9 % IV SOLN: INTRAVENOUS | Status: DC

## 2022-10-07 MED ORDER — LACTATED RINGERS IV SOLN: INTRAVENOUS | Status: DC

## 2022-10-07 MED ORDER — PROPOFOL 10 MG/ML IV BOLUS
INTRAVENOUS | Status: DC | PRN
  Administered 2022-10-07 (×6): 30 mg via INTRAVENOUS
  Administered 2022-10-07: 80 mg via INTRAVENOUS

## 2022-10-07 SURGICAL SUPPLY — 21 items
CLIP HMST 235XBRD CATH ROT (MISCELLANEOUS) IMPLANT
CLIP RESOLUTION 360 11X235 (MISCELLANEOUS)
ELECT REM PT RETURN 9FT ADLT (ELECTROSURGICAL)
ELECTRODE REM PT RTRN 9FT ADLT (ELECTROSURGICAL) IMPLANT
FORCEPS BIOP RAD 4 LRG CAP 4 (CUTTING FORCEPS) IMPLANT
GOWN CVR UNV OPN BCK APRN NK (MISCELLANEOUS) ×4 IMPLANT
GOWN ISOL THUMB LOOP REG UNIV (MISCELLANEOUS) ×2
INJECTOR VARIJECT VIN23 (MISCELLANEOUS) IMPLANT
KIT DEFENDO VALVE AND CONN (KITS) IMPLANT
KIT PRC NS LF DISP ENDO (KITS) ×2 IMPLANT
KIT PROCEDURE OLYMPUS (KITS) ×1
MANIFOLD NEPTUNE II (INSTRUMENTS) ×2 IMPLANT
MARKER SPOT ENDO TATTOO 5ML (MISCELLANEOUS) IMPLANT
PROBE APC STR FIRE (PROBE) IMPLANT
RETRIEVER NET ROTH 2.5X230 LF (MISCELLANEOUS) IMPLANT
SNARE COLD EXACTO (MISCELLANEOUS) IMPLANT
SNARE SHORT THROW 13M SML OVAL (MISCELLANEOUS) IMPLANT
SNARE SNG USE RND 15MM (INSTRUMENTS) IMPLANT
TRAP ETRAP POLY (MISCELLANEOUS) IMPLANT
VARIJECT INJECTOR VIN23 (MISCELLANEOUS)
WATER STERILE IRR 250ML POUR (IV SOLUTION) ×2 IMPLANT

## 2022-10-07 NOTE — Op Note (Signed)
[facility] Patient Name: Lori Morales Procedure Date: 10/07/2022 8:14 AM MRN: [id number] Account #: [id number] Date of Birth: [birth year] Admit Type: Outpatient Age: 62 Room: [facility] OR ROOM 01 Gender: Female Note Status: Finalized Instrument Name: [id number] Procedure:             Colonoscopy Indications:           High risk colon cancer surveillance: Personal history                         of colonic polyps Providers:             [name] MD, MD Referring MD:          [name] MD, MD (Referring MD), Daniel Nones, MD                         (Referring MD) Medicines:             Propofol per Anesthesia Complications:         No immediate complications. Procedure:             Pre-Anesthesia Assessment:                        - Prior to the procedure, a History and Physical was                         performed, and patient medications and allergies were                         reviewed. The patient's tolerance of previous                         anesthesia was also reviewed. The risks and benefits                         of the procedure and the sedation options and risks                         were discussed with the patient. All questions were                         answered, and informed consent was obtained. Prior                         Anticoagulants: The patient has taken no anticoagulant                         or antiplatelet agents. ASA Grade Assessment: II - A                         patient with mild systemic disease. After reviewing                         the risks and benefits, the patient was deemed in                         satisfactory condition to undergo the procedure.  After obtaining informed consent, the colonoscope was                         passed under direct vision. Throughout the procedure,                         the patient's blood pressure, pulse, and oxygen                          saturations were monitored continuously. The                         Colonoscope was introduced through the anus and                         advanced to the the cecum, identified by appendiceal                         orifice and ileocecal valve. The colonoscopy was                         performed without difficulty. The patient tolerated                         the procedure well. The quality of the bowel                         preparation was excellent. Findings:      The perianal and digital rectal examinations were normal.      Non-bleeding internal hemorrhoids were found during retroflexion. The       hemorrhoids were Grade I (internal hemorrhoids that do not prolapse). Impression:            - Non-bleeding internal hemorrhoids.                        - No specimens collected. Recommendation:        - Discharge patient to home.                        - Resume previous diet.                        - Continue present medications.                        - Await pathology results.                        - Repeat colonoscopy in 5 years for surveillance. Procedure Code(s):     --- Professional ---                        [phone], Colonoscopy, flexible; diagnostic, including                         collection of specimen(s) by brushing or washing, when                         performed (separate procedure) Diagnosis Code(s):     --- Professional ---  Z86.010, Personal history of colonic polyps CPT copyright 2022 American Medical Association. All rights reserved. The codes documented in this report are preliminary and upon coder review may  be revised to meet current compliance requirements. [name] MD, MD 10/07/2022 8:42:26 AM This report has been signed electronically. Number of Addenda: 0 Note Initiated On: 10/07/2022 8:14 AM Scope Withdrawal Time: 0 hours 9 minutes 16 seconds  Total Procedure Duration: 0 hours 16 minutes 8 seconds  Estimated Blood Loss:   Estimated blood loss: none.      [facility]

## 2022-10-07 NOTE — H&P (Signed)
Lori Minium, MD [facility] [address], [address] [place], [place] [address] Phone:[phone] Fax : [phone]  Primary Care Physician:  Lynnea Ferrier, MD Primary Gastroenterologist:  Dr. Servando Snare  Pre-Procedure History & Physical: HPI:  Lori Morales is a 62 y.o. female is here for an colonoscopy.   Past Medical History:  Diagnosis Date   Arthritis    CAD (coronary artery disease), native coronary artery    mild non calcified plaque in mid LAD and prox LCx on coronary CTA 01/2019   Depression    GERD (gastroesophageal reflux disease)    Headache    migraines   Hx of squamous cell carcinoma 07/11/2016   Right distal dorsum lat forearm    Hypertension    IBS (irritable bowel syndrome)    Palpitations    PFO (patent foramen ovale)    small by cardiac CTA 01/2019   PONV (postoperative nausea and vomiting)    Restless legs syndrome    Squamous cell carcinoma of skin 12/202/2017   right distal dorsum lateral forearm    Past Surgical History:  Procedure Laterality Date   ABDOMINOPLASTY     BACK SURGERY     1 laminectomy, 1 fusion   COLONOSCOPY     COLONOSCOPY WITH PROPOFOL N/A 01/17/2017   Procedure: COLONOSCOPY WITH PROPOFOL;  Surgeon: Christena Deem, MD;  Location: [facility] ENDOSCOPY;  Service: Endoscopy;  Laterality: N/A;   WISDOM TOOTH EXTRACTION      Prior to Admission medications   Medication Sig Start Date End Date Taking? Authorizing Provider  amitriptyline (ELAVIL) 10 MG tablet Take 10 mg by mouth at bedtime.    Yes [provider]  amitriptyline (ELAVIL) 10 MG tablet Take 1 tablet (10 mg total) by mouth at bedtime 03/09/22  Yes   aspirin EC 81 MG tablet Take 81 mg by mouth daily.  06/16/18  Yes [provider]  celecoxib (CELEBREX) 200 MG capsule Take 200 mg by mouth every evening.   Yes [provider]  celecoxib (CELEBREX) 200 MG capsule Take 1 capsule (200 mg total) by mouth 2 (two) times daily as needed for Pain 08/19/21  Yes    celecoxib (CELEBREX) 200 MG capsule Take 1 capsule (200 mg total) by mouth 2 (two) times daily as needed for Pain 06/06/22  Yes   cyclobenzaprine (FLEXERIL) 10 MG tablet Take 1 tablet (10 mg total) by mouth at bedtime as needed for Muscle spasms 04/19/21  Yes   Estradiol 10 MCG TABS vaginal tablet Place 1 tablet (10 mcg total) vaginally twice a week 02/15/22  Yes   HYDROcodone-acetaminophen (NORCO) 10-325 MG tablet Take 0.5 tablets by mouth every 6 (six) hours as needed for Pain for up to 5 days 09/09/21  Yes   losartan (COZAAR) 100 MG tablet Take 1 tablet (100 mg total) by mouth daily. 09/06/22  Yes   methylPREDNISolone (MEDROL DOSEPAK) 4 MG TBPK tablet Follow package directions. 06/16/22  Yes   MIMVEY LO 0.5-0.1 MG per tablet Take 1 tablet by mouth at bedtime.  10/22/14  Yes [provider]  pantoprazole (PROTONIX) 40 MG tablet Take 40 mg by mouth at bedtime.    Yes [provider]  pantoprazole (PROTONIX) 40 MG tablet Take 1 tablet (40 mg total) by mouth once daily 01/07/22  Yes   pramipexole (MIRAPEX) 0.25 MG tablet Take 1 to 1 and half tablet daily.   Yes [provider]  pramipexole (MIRAPEX) 0.25 MG tablet Take 1 tablet (0.25 mg total)  by mouth at bedtime. 05/18/22  Yes   pravastatin (PRAVACHOL) 40 MG tablet Take 1 tablet (40 mg total) by mouth daily. Please call to schedule office visit for further refills. Thank you! 11/28/19  Yes Iran Ouch, MD  predniSONE (DELTASONE) 10 MG tablet Take 4 tabs daily for 2 days, then 3 tabs daily for 2 days, then 2 tabs daily for 2 days, then 1 tab daily for 2 days 10/27/20  Yes   tiZANidine (ZANAFLEX) 2 MG tablet Take 1 tablet (2 mg total) by mouth 3 (three) times daily as needed 04/19/21  Yes   zolpidem (AMBIEN CR) 6.25 MG CR tablet Take 6.25 mg by mouth at bedtime as needed for sleep.    Yes [provider]  zolpidem (AMBIEN CR) 6.25 MG CR tablet TAKE 1 TABLET BY MOUTH NIGHTLY AS NEEDED FOR SLEEP 01/11/21  Yes   ALPRAZolam  (XANAX) 0.5 MG tablet Take 1 tablet by mouth 1 hour prior to procedure Patient not taking: Reported on 10/03/2022 12/15/20     azithromycin (ZITHROMAX) 250 MG tablet Take 2 tablets (500mg ) by mouth on Day 1. Take 1 tablet by mouth on Days 2-5. Patient not taking: Reported on 10/03/2022 06/01/21     cefadroxil (DURICEF) 500 MG capsule Take one capsule by mouth twice a day until gone Patient not taking: Reported on 10/03/2022 10/06/20     celecoxib (CELEBREX) 200 MG capsule Take 1 capsule (200 mg total) by mouth 2 (two) times daily as needed for Pain 11/23/20     cephALEXin (KEFLEX) 500 MG capsule Take 1 capsule(s) EVERY 6 HOURS by mouth, begin 2 days prior to procedure, take day of procedure and 2 days after procedure Patient not taking: Reported on 10/03/2022 01/28/21     ciprofloxacin (CILOXAN) 0.3 % ophthalmic solution Place 1 drop in affected eye(s) every 2 hours while awake for 2 days. Then 1 drop every 4 hours while awake for the next 5 days. Patient not taking: Reported on 10/03/2022 09/06/22     cyclobenzaprine (FLEXERIL) 10 MG tablet Take 1 tablet (10 mg total) by mouth at bedtime as needed for Muscle spasms Patient not taking: Reported on 10/03/2022 08/19/21     cyclobenzaprine (FLEXERIL) 10 MG tablet Take 1 tablet (10 mg total) by mouth at bedtime as needed for Muscle spasms. 05/04/22     diazepam (VALIUM) 10 MG tablet TAKE ONE TABLET BY MOUTH ONE HOUR PRIOR TO PROCEDURE Patient not taking: Reported on 10/03/2022 10/06/20     diazepam (VALIUM) 10 MG tablet take 1 tablet  45 minutes before procedure. Patient not taking: Reported on 10/03/2022 06/09/21     estradiol (ESTRACE) 0.1 MG/GM vaginal cream Place 1 gram vaginally twice a week and a small amount externally daily Patient not taking: Reported on 10/03/2022 10/22/19     Estradiol-Norethindrone Acet 0.5-0.1 MG tablet Take 1 tablet by mouth once daily Patient not taking: Reported on 10/03/2022 01/06/22     HYDROcodone-acetaminophen (NORCO/VICODIN) 5-325 MG tablet TAKE  1 TO 2 TABLETS BY MOUTH EVERY 4 TO 6 HOURS AS NEEDED FOR PAIN. MAX 10 TABLETS PER DAY. Patient not taking: Reported on 10/03/2022 10/06/20     HYDROcodone-acetaminophen (NORCO/VICODIN) 5-325 MG tablet Take 1 tablet by mouth every 6 (six) hours as needed for Pain for up to 5 days Patient not taking: Reported on 10/03/2022 05/19/21     HYDROcodone-acetaminophen (NORCO/VICODIN) 5-325 MG tablet Take 1 tablet by mouth every 6 (six) hours as needed for Pain for up to 5  days Patient not taking: Reported on 10/03/2022 03/22/22     HYDROcodone-acetaminophen (NORCO/VICODIN) 5-325 MG tablet Take 1 tablet by mouth every 6 (six) hours as needed for Pain for up to 5 days Patient not taking: Reported on 10/03/2022 09/30/22     losartan (COZAAR) 100 MG tablet Take 1 tablet (100 mg total) by mouth daily. Patient not taking: Reported on 10/03/2022 09/15/22     losartan (COZAAR) 25 MG tablet Take 1 tablet (25 mg total) by mouth daily. 06/16/18 12/13/18  Houston Siren, MD  losartan (COZAAR) 25 MG tablet TAKE 1 TABLET BY MOUTH ONCE DAILY 04/22/20 04/22/21  Lynnea Ferrier, MD  losartan (COZAAR) 25 MG tablet Take 1 tablet (25 mg total) by mouth once daily 01/06/22     losartan (COZAAR) 25 MG tablet Take 1 tablet (25 mg total) by mouth once daily 01/07/22     losartan (COZAAR) 25 MG tablet Take 1 tablet (25 mg total) by mouth once daily 03/08/22     ondansetron (ZOFRAN) 8 MG tablet TAKE ONE TABLET BY MOUTH EVERY 8 HOURS AS NEEDED FOR NAUSEA Patient not taking: Reported on 10/03/2022 10/06/20     oseltamivir (TAMIFLU) 75 MG capsule Take 1 capsule (75 mg total) by mouth once daily for 10 days 06/08/22     pantoprazole (PROTONIX) 40 MG tablet TAKE 1 TABLET BY MOUTH ONCE DAILY 08/11/20 08/11/21  Curtis Sites III, MD  pantoprazole (PROTONIX) 40 MG tablet Take 1 tablet (40 mg total) by mouth once daily Patient not taking: Reported on 10/03/2022 08/19/21     pantoprazole (PROTONIX) 40 MG tablet Take 1 tablet (40 mg total) by mouth once  daily Patient not taking: Reported on 10/03/2022 02/22/22     pantoprazole (PROTONIX) 40 MG tablet Take 1 tablet (40 mg total) by mouth daily. Patient not taking: Reported on 10/03/2022 05/18/22     pramipexole (MIRAPEX) 0.25 MG tablet TAKE 2 TABLETS BY MOUTH NIGHTLY FOR 30 DAYS. PLEASE TAKE 1 & 1/2 TABLETS AS NEEDED. 03/03/20 03/13/21  Curtis Sites III, MD  pramipexole (MIRAPEX) 0.25 MG tablet TAKE 2 TABLETS BY MOUTH NIGHTLY FOR 30 DAYS. PLEASE TAKE 1 & 1/2 TABLETS AS NEEDED. Patient not taking: Reported on 10/03/2022 03/08/21     pramipexole (MIRAPEX) 0.25 MG tablet Take 1 tablet by mouth at bedtime 01/07/22     pravastatin (PRAVACHOL) 40 MG tablet TAKE 1 TABLET BY MOUTH ONCE DAILY 03/03/20 03/03/21  Curtis Sites III, MD  pravastatin (PRAVACHOL) 40 MG tablet Take 1 tablet (40 mg total) by mouth once daily Patient not taking: Reported on 10/03/2022 08/19/21     pravastatin (PRAVACHOL) 40 MG tablet Take 1 tablet (40 mg total) by mouth once daily Patient not taking: Reported on 10/03/2022 01/07/22     pravastatin (PRAVACHOL) 40 MG tablet Take 1 tablet (40 mg total) by mouth daily. Patient not taking: Reported on 10/03/2022 05/18/22     predniSONE (DELTASONE) 10 MG tablet Take 4 tabs daily for 2 days, then 3 tabs daily for 2 days, then 2 tabs daily for 2 days, then 1 tab daily for 2 days Patient not taking: Reported on 10/03/2022 04/20/21     predniSONE (DELTASONE) 10 MG tablet Take 4 tabs daily for 2 days, then 3 tabs daily for 2 days, then 2 tabs daily for 2 days, then 1 tab daily for 2 days Patient not taking: Reported on 10/03/2022 08/26/21     predniSONE (DELTASONE) 10 MG tablet Take 4 tablets by  mouth daily for 2 days, 3 tabs for 2 days, 2 tabs for 2 days, then 1 tab daily for 2 days Patient not taking: Reported on 10/03/2022 09/29/22     sucralfate (CARAFATE) 1 g tablet Take 1 tablet (1 g total) by mouth 4 (four) times daily -  before meals and at bedtime for 7 days. Patient not taking: Reported on 10/03/2022 05/04/22      tiZANidine (ZANAFLEX) 2 MG tablet Take 1 tablet (2 mg total) by mouth 3 (three) times daily as needed Patient not taking: Reported on 10/03/2022 08/19/21     tiZANidine (ZANAFLEX) 2 MG tablet Take 1 tablet (2 mg total) by mouth 3 (three) times daily as needed. Patient not taking: Reported on 10/03/2022 05/04/22     traMADol (ULTRAM) 50 MG tablet Take 1 tablet by mouth every 6 hours Patient not taking: Reported on 10/03/2022 01/28/21     triamcinolone cream (KENALOG) 0.1 % Apply daily under bandaid for up to 3 weeks to spot at finger. Avoid applying to face, groin, and axilla. Can also use twice a day as needed for bug bites up to 2 weeks. Patient not taking: Reported on 10/03/2022 10/01/20   Neale Burly, [place], MD  zolpidem (AMBIEN CR) 6.25 MG CR tablet TAKE 1 TABLET BY MOUTH NIGHTLY AS NEEDED FOR SLEEP 07/08/20 01/04/21  Curtis Sites III, MD  zolpidem (AMBIEN CR) 6.25 MG CR tablet Take 1 tablet (6.25 mg total) by mouth at bedtime as needed for Sleep Patient not taking: Reported on 10/03/2022 03/24/22     zolpidem (AMBIEN CR) 6.25 MG CR tablet Take 1 tablet (6.25 mg total) by mouth at bedtime as needed for sleep. Patient not taking: Reported on 10/03/2022 05/04/22     zolpidem (AMBIEN CR) 6.25 MG CR tablet Take 1 tablet (6.25 mg total) by mouth at bedtime as needed for Sleep Patient not taking: Reported on 10/03/2022 06/06/22       Allergies as of 08/31/2022 - Review Complete 08/29/2022  Allergen Reaction Noted   Penicillins Hives and Rash 03/13/2012   Pristiq [desvenlafaxine] Swelling and Other (See Comments) 12/29/2014   Rosuvastatin Other (See Comments) 07/09/2018    Family History  Problem Relation Age of Onset   CAD Mother    Stroke Mother    Heart attack Father    Breast cancer Maternal Aunt 40   Breast cancer Cousin     Social History   Socioeconomic History   Marital status: Married    Spouse name: Not on file   Number of children: Not on file   Years of education: Not on file   Highest  education level: Not on file  Occupational History   Not on file  Tobacco Use   Smoking status: Never   Smokeless tobacco: Never  Substance and Sexual Activity   Alcohol use: No   Drug use: No   Sexual activity: Not on file  Other Topics Concern   Not on file  Social History Narrative   Not on file   Social Determinants of Health   Financial Resource Strain: Not on file  Food Insecurity: Not on file  Transportation Needs: Not on file  Physical Activity: Not on file  Stress: Not on file  Social Connections: Not on file  Intimate Partner Violence: Not on file    Review of Systems: See HPI, otherwise negative ROS  Physical Exam: Ht 5\' 5"  (1.651 m)   Wt 77.1 kg   LMP 02/13/2012   BMI 28.29 kg/m  General:   Alert,  pleasant and cooperative in NAD Head:  Normocephalic and atraumatic. Neck:  Supple; no masses or thyromegaly. Lungs:  Clear throughout to auscultation.    Heart:  Regular rate and rhythm. Abdomen:  Soft, nontender and nondistended. Normal bowel sounds, without guarding, and without rebound.   Neurologic:  Alert and  oriented x4;  grossly normal neurologically.  Impression/Plan: Lori Morales is here for an colonoscopy to be performed for a history of adenomatous polyps on 2018   Risks, benefits, limitations, and alternatives regarding  colonoscopy have been reviewed with the patient.  Questions have been answered.  All parties agreeable.   Lori Minium, MD  10/07/2022, 7:49 AM

## 2022-10-07 NOTE — Transfer of Care (Signed)
Immediate Anesthesia Transfer of Care Note  Patient: Lori Morales  Procedure(s) Performed: COLONOSCOPY WITH PROPOFOL (Rectum)  Patient Location: PACU  Anesthesia Type: General  Level of Consciousness: awake, alert  and patient cooperative  Airway and Oxygen Therapy: Patient Spontanous Breathing and Patient connected to supplemental oxygen  Post-op Assessment: Post-op Vital signs reviewed, Patient's Cardiovascular Status Stable, Respiratory Function Stable, Patent Airway and No signs of Nausea or vomiting  Post-op Vital Signs: Reviewed and stable  Complications: No notable events documented.

## 2022-10-07 NOTE — Anesthesia Postprocedure Evaluation (Signed)
Anesthesia Post Note  Patient: Lori Morales  Procedure(s) Performed: COLONOSCOPY WITH PROPOFOL (Rectum)  Patient location during evaluation: PACU Anesthesia Type: General Level of consciousness: awake and alert Pain management: pain level controlled Vital Signs Assessment: post-procedure vital signs reviewed and stable Respiratory status: spontaneous breathing, nonlabored ventilation, respiratory function stable and patient connected to nasal cannula oxygen Cardiovascular status: blood pressure returned to baseline and stable Postop Assessment: no apparent nausea or vomiting Anesthetic complications: no   No notable events documented.   Last Vitals:  Vitals:   10/07/22 0845 10/07/22 0851  BP: 119/76 124/83  Pulse: 76 77  Resp: 15 14  Temp:  (!) 36.3 C  SpO2: 100% 99%    Last Pain:  Vitals:   10/07/22 0851  TempSrc:   PainSc: 0-No pain                 Marisue Humble

## 2022-10-10 ENCOUNTER — Encounter: Payer: Self-pay | Admitting: Gastroenterology

## 2022-10-12 ENCOUNTER — Ambulatory Visit: Payer: Commercial Managed Care - PPO

## 2022-10-12 DIAGNOSIS — R2689 Other abnormalities of gait and mobility: Secondary | ICD-10-CM | POA: Diagnosis not present

## 2022-10-12 DIAGNOSIS — M542 Cervicalgia: Secondary | ICD-10-CM

## 2022-10-12 DIAGNOSIS — R293 Abnormal posture: Secondary | ICD-10-CM

## 2022-10-12 NOTE — Therapy (Signed)
OUTPATIENT PHYSICAL THERAPY CERVICAL TREATMENT/ Physical Therapy Progress Note   Dates of reporting period  08/24/22   to   10/12/22    Patient Name: Lori Morales MRN: [id number] DOB:[birth year], 62 y.o., female Today's Date: 10/12/2022  END OF SESSION:  PT End of Session - 10/12/22 0714     Visit Number 10    Number of Visits 16    Date for PT Re-Evaluation 10/19/22    Authorization Type [employer] employee [employer]    Authorization Time Period 08/24/22-10/19/22    Progress Note Due on Visit 10    PT Start Time 0715    PT Stop Time 0759    PT Time Calculation (min) 44 min    Activity Tolerance Patient tolerated treatment well;Patient limited by pain    Behavior During Therapy [facility] for tasks assessed/performed                  Past Medical History:  Diagnosis Date   Arthritis    CAD (coronary artery disease), native coronary artery    mild non calcified plaque in mid LAD and prox LCx on coronary CTA 01/2019   Depression    GERD (gastroesophageal reflux disease)    Headache    migraines   Hx of squamous cell carcinoma 07/11/2016   Right distal dorsum lat forearm    Hypertension    IBS (irritable bowel syndrome)    Palpitations    PFO (patent foramen ovale)    small by cardiac CTA 01/2019   PONV (postoperative nausea and vomiting)    Restless legs syndrome    Squamous cell carcinoma of skin 12/202/2017   right distal dorsum lateral forearm   Past Surgical History:  Procedure Laterality Date   ABDOMINOPLASTY     BACK SURGERY     1 laminectomy, 1 fusion   COLONOSCOPY     COLONOSCOPY WITH PROPOFOL N/A 01/17/2017   Procedure: COLONOSCOPY WITH PROPOFOL;  Surgeon: Christena Deem, MD;  Location: [facility] ENDOSCOPY;  Service: Endoscopy;  Laterality: N/A;   COLONOSCOPY WITH PROPOFOL N/A 10/07/2022   Procedure: COLONOSCOPY WITH PROPOFOL;  Surgeon: [name], MD;  Location: [facility];  Service: Endoscopy;  Laterality: N/A;   WISDOM TOOTH EXTRACTION     Patient Active  Problem List   Diagnosis Date Noted   Personal history of colonic polyps 10/07/2022   PFO (patent foramen ovale)    CAD (coronary artery disease), native coronary artery    Acute CVA (cerebrovascular accident) (HCC) 06/15/2018   Depression 10/16/2017   Migraine 10/16/2017   Hyperglycemia, unspecified 07/10/2017   Hyperglycemia 07/10/2017   H/O dizziness 08/02/2016   Cervical disc disorder with radiculopathy of cervical region 04/12/2016   Nonallopathic lesion of thoracic region 04/12/2016   Nonallopathic lesion of rib cage 04/12/2016   Facet arthritis of cervical region 03/21/2016   Hyperlipidemia 01/26/2015   Labral tear of shoulder, right, subsequent encounter 12/04/2014   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   Impingement syndrome of right shoulder 05/28/2014   Right supraspinatus tenosynovitis 05/28/2014   Arthritis pain 04/03/2014   Fatigue 04/03/2014   Cervical intraepithelial neoplasia grade 1 06/20/2012   IBS (irritable bowel syndrome) 06/20/2012   Lumbar disc disease 06/20/2012    PCP: Lynnea Ferrier MD  REFERRING PROVIDER: Brown,Christopher R MD  REFERRING DIAG: cervical radiculopathy  THERAPY DIAG:  Cervicalgia  Abnormal posture  Other abnormalities of gait and mobility  Rationale for Evaluation and Treatment: Rehabilitation  ONSET DATE: 06/14/22  SUBJECTIVE:  SUBJECTIVE STATEMENT: Patient was put on prednisone which helped her neurological symptoms for a short period. Yesterday her low back has been impacting her; had her x ray. Has been compliant with HEP.   Hand dominance: Right  PERTINENT HISTORY:  Patient is returning to PT for s/p cervical fusion C5-7 with bilateral foraminotomies at C6-7 on 06/14/22. Patient has a history of C6-7 arthroplasty and has been treated by  this clinician before. PMH includes anxiety, CAD, depression, arthritis of cervical region, gastritis, GERD, cancer, stroke, HLD, HTN, IB, lumbar disc disease (fusion x2 ), migraine, numbness of L hand, headaches, and restless leg syndrome.  Patient had posterior cervical fusion from C5-C7 and bilateral foraminotomies at C6-7 on 06/14/22; Only restriction is no lifting >15 lb  PAIN:  Are you having pain? Yes: NPRS scale: 3/10 Pain location: lower cervical and across traps Pain description: aching Aggravating factors: positioning Relieving factors: positioning, heat  PRECAUTIONS: Cervical  WEIGHT BEARING RESTRICTIONS:  no lifting>15 lb   FALLS:  Has patient fallen in last 6 months? No  LIVING ENVIRONMENT: Lives with: lives with their spouse Lives in: House/apartment Stairs: Yes: External:   steps;     OCCUPATION: Chiropodist   PLOF: Independent  PATIENT GOALS: to have more range of motion  NEXT MD VISIT: July 3rd, 2024  OBJECTIVE:   DIAGNOSTIC FINDINGS:  IMPRESSION: 1. Anterior cervical fusion at C6-7. Susceptibility artifact partially obscures evaluation of the neural foramina and central canal. No significant right foraminal stenosis. Left uncovertebral degenerative changes with moderate left foraminal stenosis. 2. At C2-3 there is a minimal broad-based disc bulge. Moderate right facet arthropathy. Mild right foraminal stenosis. 3. At C3-4 there is a minimal broad-based disc bulge. Moderate right facet arthropathy. Mild left foraminal stenosis. 4. At C4-5 there is a minimal broad-based disc bulge. Mild right facet arthropathy. Moderate left foraminal stenosis. 5. At C7-T1 there is a minimal broad-based disc bulge. Mild right and moderate left foraminal stenosis.    IMPRESSION: 1. Anterior cervical fusion at C6-7. Susceptibility artifact partially obscures evaluation of the neural foramina and central canal. No significant right foraminal stenosis. Left  uncovertebral degenerative changes with moderate left foraminal stenosis. 2. At C2-3 there is a minimal broad-based disc bulge. Moderate right facet arthropathy. Mild right foraminal stenosis. 3. At C3-4 there is a minimal broad-based disc bulge. Moderate right facet arthropathy. Mild left foraminal stenosis. 4. At C4-5 there is a minimal broad-based disc bulge. Mild right facet arthropathy. Moderate left foraminal stenosis. 5. At C7-T1 there is a minimal broad-based disc bulge. Mild right and moderate left foraminal stenosis.  PATIENT SURVEYS:  NDI 42% FOTO 45  COGNITION: Overall cognitive status: Within functional limits for tasks assessed  SENSATION: Light touch: WFL; occasional R hand numbness with sleep   POSTURE: rounded shoulders  PALPATION: Hardware present, tenderness at C5-6, C6-7.    CERVICAL ROM:   Active ROM A/PROM (deg) eval PN 5/15:  Flexion 24 39  Extension 25 30  Right lateral flexion 9 20  Left lateral flexion 19 21  Right rotation 18 30  Left rotation 19 31   (Blank rows = not tested)  UPPER EXTREMITY ROM:  Active ROM Right eval Left eval  Shoulder flexion [facility]  Shoulder extension    Shoulder abduction 170 140   (Blank rows = not tested)  UPPER EXTREMITY MMT:  MMT Right eval Left eval  Shoulder flexion 4+ 4+  Shoulder extension 4+ 4+  Shoulder abduction 4+ 4+  Shoulder adduction 4+  4+  Shoulder extension 4+ 4+  Elbow flexion 4+ 4+  Elbow extension 4+ 4+   (Blank rows = not tested)    TODAY'S TREATMENT:                                                                                                                              DATE: 10/12/22 Goals: See below for details  Manual Therapy: B UT with cervical paraspinals release in seated . x8 minutes with implementation of effleurage and ptrissage to improve cervical AROM.  Occiput release 3x30 seconds     There.Ex: UBE: level 4; 2:30 minutes forward, 2:30 minutes  backwards. Supine:  Chin tuck: x12 Cervical rotation: x12/side, 2-3 sec holds  Overhead Y RTB for lower trap activation and scapulohumeral rhythm: 3x10 Cervical extension 10x   Seated: Trunk extension with pectoral opening 12x   PATIENT EDUCATION:  Education details: goals, POC, HEP Person educated: Patient Education method: Explanation, Demonstration, Tactile cues, and Verbal cues Education comprehension: verbalized understanding, returned demonstration, verbal cues required, and tactile cues required  HOME EXERCISE PROGRAM: Access Code: [email or web address] URL: [email or web address] Date: 08/24/2022 Prepared by: Precious Bard  Exercises - Supine Cervical Retraction with Towel  - 1 x daily - 7 x weekly - 2 sets - 10 reps - 5 hold - Seated Scapular Retraction  - 1 x daily - 7 x weekly - 2 sets - 10 reps - 5 hold - Supine Cervical Rotation AROM on Pillow  - 1 x daily - 7 x weekly - 2 sets - 10 reps - 5 hold - Neck Sidebending  - 1 x daily - 7 x weekly - 2 sets - 10 reps - 5 hold - Supine Cervical Flexion Extension on Pillow  - 1 x daily - 7 x weekly - 2 sets - 10 reps - 5 hold  ASSESSMENT:  CLINICAL IMPRESSION: Patient has made significant progress in all goals. Patient has significant improvement of cervical ROM, FOTO, NDI, and return to activity.  Patient's condition has the potential to improve in response to therapy. Maximum improvement is yet to be obtained. The anticipated improvement is attainable and reasonable in a generally predictable time. Pt will benefit from skilled physical therapy to reduce pain,improve ROM, and return to PLOF.  OBJECTIVE IMPAIRMENTS: decreased activity tolerance, decreased endurance, decreased ROM, decreased strength, hypomobility, increased muscle spasms, impaired flexibility, improper body mechanics, postural dysfunction, and pain.   ACTIVITY LIMITATIONS: carrying, lifting, sitting, standing, reach over head, and work  PARTICIPATION  LIMITATIONS: meal prep, cleaning, laundry, driving, shopping, community activity, occupation, and yard work  PERSONAL FACTORS: Past/current experiences, Catering manager, Sex, Time since onset of injury/illness/exacerbation, and 3+ comorbidities: anxiety, CAD, depression, arthritis of cervical region, gastritis, GERD, cancer, stroke, HLD, HTN, IB, lumbar disc disease (fusion x2 ), migraine, numbness of L hand, headaches, and restless leg syndrome  are also affecting patient's functional outcome.   REHAB POTENTIAL: Fair    CLINICAL DECISION MAKING: Evolving/moderate complexity  EVALUATION COMPLEXITY: Moderate   GOALS: Goals reviewed with patient? Yes  SHORT TERM GOALS: Target date: 09/21/2022    Patient will be independent in home exercise program to improve strength/mobility for better functional independence with ADLs. Baseline: 3/27: given 5/15; in progress  Goal status: IN PROGRESS    LONG TERM GOALS: Target date: 10/19/2022    Patient will increase FOTO score to equal to or greater than  55   to demonstrate statistically significant improvement in mobility and quality of life.  Baseline: 45 5/15: 53%  Goal status: Partially Met   2.  Patient will reduce Neck Disability Index score to <10% to demonstrate minimal disability with ADL's including improved sleeping tolerance, sitting tolerance, etc for better mobility at home and work. Baseline: 42% 5/15: 24%  Goal status: Partially Met  3.  Patient will improve cervical ROM by 20 degrees flexion, sidebending, and rotation for improved quality of life and ability to drive.  Baseline: 3/27: see above 5/15: see above Goal status: Partially Met  4.  Patient will return to full work and recreational activities without compensation or pain increase of >3/10.  Baseline: 3/27: unable to perform 5/15: returned to work, beginning recreation of sewing but needs to take breaks.  Goal status: Partially Met    PLAN:  PT FREQUENCY:  2x/week  PT DURATION: 8 weeks  PLANNED INTERVENTIONS: Therapeutic exercises, Therapeutic activity, Neuromuscular re-education, Balance training, Gait training, Patient/Family education, Self Care, Joint mobilization, Stair training, Vestibular training, Canalith repositioning, Dry Needling, Spinal mobilization, Cryotherapy, Moist heat, scar mobilization, Traction, Ultrasound, Manual therapy, and Re-evaluation  PLAN FOR NEXT SESSION: upper trap release, cervical ROM increase, postural strengthening.   Precious Bard PT  Physical Therapist- [place]  [facility]  10/12/2022, 8:05 AM

## 2022-10-16 ENCOUNTER — Other Ambulatory Visit: Payer: Self-pay

## 2022-10-17 ENCOUNTER — Other Ambulatory Visit: Payer: Self-pay

## 2022-10-18 NOTE — Therapy (Signed)
OUTPATIENT PHYSICAL THERAPY CERVICAL TREATMENT/ RECERT   Patient Name: Lori Morales MRN: [id number] DOB:Dec 09, 1960, 62 y.o., female Today's Date: 10/19/2022  END OF SESSION:  PT End of Session - 10/19/22 0713     Visit Number 11    Number of Visits 27    Date for PT Re-Evaluation 12/14/22    Authorization Type [employer] employee [employer]    Authorization Time Period 08/24/22-10/19/22    Progress Note Due on Visit 10    PT Start Time 0714    PT Stop Time 0759    PT Time Calculation (min) 45 min    Activity Tolerance Patient tolerated treatment well;Patient limited by pain    Behavior During Therapy [facility] for tasks assessed/performed                   Past Medical History:  Diagnosis Date   Arthritis    CAD (coronary artery disease), native coronary artery    mild non calcified plaque in mid LAD and prox LCx on coronary CTA 01/2019   Depression    GERD (gastroesophageal reflux disease)    Headache    migraines   Hx of squamous cell carcinoma 07/11/2016   Right distal dorsum lat forearm    Hypertension    IBS (irritable bowel syndrome)    Palpitations    PFO (patent foramen ovale)    small by cardiac CTA 01/2019   PONV (postoperative nausea and vomiting)    Restless legs syndrome    Squamous cell carcinoma of skin 12/202/2017   right distal dorsum lateral forearm   Past Surgical History:  Procedure Laterality Date   ABDOMINOPLASTY     BACK SURGERY     1 laminectomy, 1 fusion   COLONOSCOPY     COLONOSCOPY WITH PROPOFOL N/A 01/17/2017   Procedure: COLONOSCOPY WITH PROPOFOL;  Surgeon: Christena Deem, MD;  Location: [facility] ENDOSCOPY;  Service: Endoscopy;  Laterality: N/A;   COLONOSCOPY WITH PROPOFOL N/A 10/07/2022   Procedure: COLONOSCOPY WITH PROPOFOL;  Surgeon: [name], MD;  Location: [facility];  Service: Endoscopy;  Laterality: N/A;   WISDOM TOOTH EXTRACTION     Patient Active Problem List   Diagnosis Date Noted   Personal history of colonic polyps  10/07/2022   PFO (patent foramen ovale)    CAD (coronary artery disease), native coronary artery    Acute CVA (cerebrovascular accident) (HCC) 06/15/2018   Depression 10/16/2017   Migraine 10/16/2017   Hyperglycemia, unspecified 07/10/2017   Hyperglycemia 07/10/2017   H/O dizziness 08/02/2016   Cervical disc disorder with radiculopathy of cervical region 04/12/2016   Nonallopathic lesion of thoracic region 04/12/2016   Nonallopathic lesion of rib cage 04/12/2016   Facet arthritis of cervical region 03/21/2016   Hyperlipidemia 01/26/2015   Labral tear of shoulder, right, subsequent encounter 12/04/2014   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   Impingement syndrome of right shoulder 05/28/2014   Right supraspinatus tenosynovitis 05/28/2014   Arthritis pain 04/03/2014   Fatigue 04/03/2014   Cervical intraepithelial neoplasia grade 1 06/20/2012   IBS (irritable bowel syndrome) 06/20/2012   Lumbar disc disease 06/20/2012    PCP: Lynnea Ferrier MD  REFERRING PROVIDER: Brown,Christopher R MD  REFERRING DIAG: cervical radiculopathy  THERAPY DIAG:  Cervicalgia  Abnormal posture  Other abnormalities of gait and mobility  Rationale for Evaluation and Treatment: Rehabilitation  ONSET DATE: 06/14/22  SUBJECTIVE:  SUBJECTIVE STATEMENT: Patient presents with increased stress from work. Has been compliant with HEP.   Hand dominance: Right  PERTINENT HISTORY:  Patient is returning to PT for s/p cervical fusion C5-7 with bilateral foraminotomies at C6-7 on 06/14/22. Patient has a history of C6-7 arthroplasty and has been treated by this clinician before. PMH includes anxiety, CAD, depression, arthritis of cervical region, gastritis, GERD, cancer, stroke, HLD, HTN, IB, lumbar disc disease (fusion x2 ),  migraine, numbness of L hand, headaches, and restless leg syndrome.  Patient had posterior cervical fusion from C5-C7 and bilateral foraminotomies at C6-7 on 06/14/22; Only restriction is no lifting >15 lb  PAIN:  Are you having pain? Yes: NPRS scale: 3/10 Pain location: lower cervical and across traps Pain description: aching Aggravating factors: positioning Relieving factors: positioning, heat  PRECAUTIONS: Cervical  WEIGHT BEARING RESTRICTIONS:  no lifting>15 lb   FALLS:  Has patient fallen in last 6 months? No  LIVING ENVIRONMENT: Lives with: lives with their spouse Lives in: House/apartment Stairs: Yes: External:   steps;     OCCUPATION: Chiropodist   PLOF: Independent  PATIENT GOALS: to have more range of motion  NEXT MD VISIT: July 3rd, 2024  OBJECTIVE:   DIAGNOSTIC FINDINGS:  IMPRESSION: 1. Anterior cervical fusion at C6-7. Susceptibility artifact partially obscures evaluation of the neural foramina and central canal. No significant right foraminal stenosis. Left uncovertebral degenerative changes with moderate left foraminal stenosis. 2. At C2-3 there is a minimal broad-based disc bulge. Moderate right facet arthropathy. Mild right foraminal stenosis. 3. At C3-4 there is a minimal broad-based disc bulge. Moderate right facet arthropathy. Mild left foraminal stenosis. 4. At C4-5 there is a minimal broad-based disc bulge. Mild right facet arthropathy. Moderate left foraminal stenosis. 5. At C7-T1 there is a minimal broad-based disc bulge. Mild right and moderate left foraminal stenosis.    IMPRESSION: 1. Anterior cervical fusion at C6-7. Susceptibility artifact partially obscures evaluation of the neural foramina and central canal. No significant right foraminal stenosis. Left uncovertebral degenerative changes with moderate left foraminal stenosis. 2. At C2-3 there is a minimal broad-based disc bulge. Moderate right facet arthropathy. Mild right  foraminal stenosis. 3. At C3-4 there is a minimal broad-based disc bulge. Moderate right facet arthropathy. Mild left foraminal stenosis. 4. At C4-5 there is a minimal broad-based disc bulge. Mild right facet arthropathy. Moderate left foraminal stenosis. 5. At C7-T1 there is a minimal broad-based disc bulge. Mild right and moderate left foraminal stenosis.  PATIENT SURVEYS:  NDI 42% FOTO 45  COGNITION: Overall cognitive status: Within functional limits for tasks assessed  SENSATION: Light touch: WFL; occasional R hand numbness with sleep   POSTURE: rounded shoulders  PALPATION: Hardware present, tenderness at C5-6, C6-7.    CERVICAL ROM:   Active ROM A/PROM (deg) eval PN 5/15:  Flexion 24 39  Extension 25 30  Right lateral flexion 9 20  Left lateral flexion 19 21  Right rotation 18 30  Left rotation 19 31   (Blank rows = not tested)  UPPER EXTREMITY ROM:  Active ROM Right eval Left eval  Shoulder flexion [facility]  Shoulder extension    Shoulder abduction 170 140   (Blank rows = not tested)  UPPER EXTREMITY MMT:  MMT Right eval Left eval  Shoulder flexion 4+ 4+  Shoulder extension 4+ 4+  Shoulder abduction 4+ 4+  Shoulder adduction 4+ 4+  Shoulder extension 4+ 4+  Elbow flexion 4+ 4+  Elbow extension 4+ 4+   (Blank  rows = not tested)    TODAY'S TREATMENT:                                                                                                                              DATE: 10/19/22   Manual Therapy:  Sub-Occiput release 5x30 seconds  Cervical rotation with overpressure at [facility] joint 3x30 seconds     There.Ex: UBE: level 4; 2:30 minutes forward, 2:30 minutes backwards. Supine:  Chin tuck: x15 Cervical rotation: x15/side, 2-3 sec holds  Overhead Y RTB for lower trap activation and scapulohumeral rhythm: 3x10 RTB ER 15x  Cervical extension 15x   Seated: GTB row 15x   Trigger Point Dry Needling (TDN), unbilled Education performed  with patient regarding potential benefit of TDN. Reviewed precautions and risks with patient. Reviewed special precautions/risks over lung fields which include pneumothorax. Reviewed signs and symptoms of pneumothorax and advised pt to go to ER immediately if these symptoms develop advise them of dry needling treatment. Extensive time spent with pt to ensure full understanding of TDN risks. Pt provided verbal consent to treatment. TDN performed to  with 0.25 x 40 single needle placements with local twitch response (LTR). Pistoning technique utilized. Improved pain-free motion following intervention. Bilateral upper trap x 3 minutes   PATIENT EDUCATION:  Education details: goals, POC, HEP Person educated: Patient Education method: Explanation, Demonstration, Tactile cues, and Verbal cues Education comprehension: verbalized understanding, returned demonstration, verbal cues required, and tactile cues required  HOME EXERCISE PROGRAM: Access Code: [email or web address] URL: [email or web address] Date: 08/24/2022 Prepared by: Precious Bard  Exercises - Supine Cervical Retraction with Towel  - 1 x daily - 7 x weekly - 2 sets - 10 reps - 5 hold - Seated Scapular Retraction  - 1 x daily - 7 x weekly - 2 sets - 10 reps - 5 hold - Supine Cervical Rotation AROM on Pillow  - 1 x daily - 7 x weekly - 2 sets - 10 reps - 5 hold - Neck Sidebending  - 1 x daily - 7 x weekly - 2 sets - 10 reps - 5 hold - Supine Cervical Flexion Extension on Pillow  - 1 x daily - 7 x weekly - 2 sets - 10 reps - 5 hold  ASSESSMENT:  CLINICAL IMPRESSION:  Goals performed previous session, please look at note from 10/12/22 for full details. Patient is progressing with functional strength and ROM. Her bilateral upper trap are limited by muscle tissue length and guarding and responded well to TDN. Patient requires decreased external cueing for postural correction this session demonstrating improved intrinsic cueing. Pt will benefit  from skilled physical therapy to reduce pain,improve ROM, and return to PLOF.  OBJECTIVE IMPAIRMENTS: decreased activity tolerance, decreased endurance, decreased ROM, decreased strength, hypomobility, increased muscle spasms, impaired flexibility, improper body mechanics, postural dysfunction, and pain.   ACTIVITY LIMITATIONS: carrying, lifting, sitting, standing, reach over head, and work  PARTICIPATION LIMITATIONS: meal prep, cleaning, laundry, driving, shopping,  community activity, occupation, and yard work  PERSONAL FACTORS: Past/current experiences, Catering manager, Sex, Time since onset of injury/illness/exacerbation, and 3+ comorbidities: anxiety, CAD, depression, arthritis of cervical region, gastritis, GERD, cancer, stroke, HLD, HTN, IB, lumbar disc disease (fusion x2 ), migraine, numbness of L hand, headaches, and restless leg syndrome  are also affecting patient's functional outcome.   REHAB POTENTIAL: Fair    CLINICAL DECISION MAKING: Evolving/moderate complexity  EVALUATION COMPLEXITY: Moderate   GOALS: Goals reviewed with patient? Yes  SHORT TERM GOALS: Target date: 11/16/2022      Patient will be independent in home exercise program to improve strength/mobility for better functional independence with ADLs. Baseline: 3/27: given 5/15; in progress  Goal status: IN PROGRESS    LONG TERM GOALS: Target date: 12/14/2022      Patient will increase FOTO score to equal to or greater than  55   to demonstrate statistically significant improvement in mobility and quality of life.  Baseline: 45 5/15: 53%  Goal status: Partially Met   2.  Patient will reduce Neck Disability Index score to <10% to demonstrate minimal disability with ADL's including improved sleeping tolerance, sitting tolerance, etc for better mobility at home and work. Baseline: 42% 5/15: 24%  Goal status: Partially Met  3.  Patient will improve cervical ROM by 20 degrees flexion, sidebending, and rotation  for improved quality of life and ability to drive.  Baseline: 3/27: see above 5/15: see above Goal status: Partially Met  4.  Patient will return to full work and recreational activities without compensation or pain increase of >3/10.  Baseline: 3/27: unable to perform 5/15: returned to work, beginning recreation of sewing but needs to take breaks.  Goal status: Partially Met    PLAN:  PT FREQUENCY: 2x/week  PT DURATION: 8 weeks  PLANNED INTERVENTIONS: Therapeutic exercises, Therapeutic activity, Neuromuscular re-education, Balance training, Gait training, Patient/Family education, Self Care, Joint mobilization, Stair training, Vestibular training, Canalith repositioning, Dry Needling, Spinal mobilization, Cryotherapy, Moist heat, scar mobilization, Traction, Ultrasound, Manual therapy, and Re-evaluation  PLAN FOR NEXT SESSION: upper trap release, cervical ROM increase, postural strengthening.   Precious Bard PT  Physical Therapist- [place]  [facility]  10/19/2022, 8:02 AM

## 2022-10-19 ENCOUNTER — Ambulatory Visit: Payer: Commercial Managed Care - PPO

## 2022-10-19 DIAGNOSIS — R293 Abnormal posture: Secondary | ICD-10-CM | POA: Diagnosis not present

## 2022-10-19 DIAGNOSIS — R2689 Other abnormalities of gait and mobility: Secondary | ICD-10-CM | POA: Diagnosis not present

## 2022-10-19 DIAGNOSIS — M542 Cervicalgia: Secondary | ICD-10-CM

## 2022-10-25 NOTE — Therapy (Signed)
OUTPATIENT PHYSICAL THERAPY CERVICAL TREATMENT   Patient Name: Lori Morales MRN: [id number] DOB:1961/01/26, 62 y.o., female Today's Date: 10/26/2022  END OF SESSION:  PT End of Session - 10/26/22 0716     Visit Number 12    Number of Visits 27    Date for PT Re-Evaluation 12/14/22    Authorization Type [employer] employee [employer]    Authorization Time Period 08/24/22-10/19/22    Progress Note Due on Visit 10    PT Start Time 0716    PT Stop Time 0759    PT Time Calculation (min) 43 min    Activity Tolerance Patient tolerated treatment well;Patient limited by pain    Behavior During Therapy [facility] for tasks assessed/performed                    Past Medical History:  Diagnosis Date   Arthritis    CAD (coronary artery disease), native coronary artery    mild non calcified plaque in mid LAD and prox LCx on coronary CTA 01/2019   Depression    GERD (gastroesophageal reflux disease)    Headache    migraines   Hx of squamous cell carcinoma 07/11/2016   Right distal dorsum lat forearm    Hypertension    IBS (irritable bowel syndrome)    Palpitations    PFO (patent foramen ovale)    small by cardiac CTA 01/2019   PONV (postoperative nausea and vomiting)    Restless legs syndrome    Squamous cell carcinoma of skin 12/202/2017   right distal dorsum lateral forearm   Past Surgical History:  Procedure Laterality Date   ABDOMINOPLASTY     BACK SURGERY     1 laminectomy, 1 fusion   COLONOSCOPY     COLONOSCOPY WITH PROPOFOL N/A 01/17/2017   Procedure: COLONOSCOPY WITH PROPOFOL;  Surgeon: Christena Deem, MD;  Location: [facility];  Service: Endoscopy;  Laterality: N/A;   COLONOSCOPY WITH PROPOFOL N/A 10/07/2022   Procedure: COLONOSCOPY WITH PROPOFOL;  Surgeon: [name], MD;  Location: [facility];  Service: Endoscopy;  Laterality: N/A;   WISDOM TOOTH EXTRACTION     Patient Active Problem List   Diagnosis Date Noted   Personal history of colonic polyps  10/07/2022   PFO (patent foramen ovale)    CAD (coronary artery disease), native coronary artery    Acute CVA (cerebrovascular accident) (HCC) 06/15/2018   Depression 10/16/2017   Migraine 10/16/2017   Hyperglycemia, unspecified 07/10/2017   Hyperglycemia 07/10/2017   H/O dizziness 08/02/2016   Cervical disc disorder with radiculopathy of cervical region 04/12/2016   Nonallopathic lesion of thoracic region 04/12/2016   Nonallopathic lesion of rib cage 04/12/2016   Facet arthritis of cervical region 03/21/2016   Hyperlipidemia 01/26/2015   Labral tear of shoulder, right, subsequent encounter 12/04/2014   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 Reports dry needling helped but has re-tightened up.    Hand dominance: Right  PERTINENT HISTORY:  Patient is returning to PT for s/p cervical fusion C5-7 with bilateral foraminotomies at C6-7 on 06/14/22. Patient has a history of C6-7 arthroplasty and has been treated by this clinician before. PMH includes anxiety, CAD, depression, arthritis of cervical region, gastritis, GERD, cancer, stroke, HLD, HTN, IB, lumbar disc disease (fusion x2 ), migraine, numbness of L hand, headaches,  and restless leg syndrome.  Patient had posterior cervical fusion from C5-C7 and bilateral foraminotomies at C6-7 on 06/14/22; Only restriction is no lifting >15 lb  PAIN:  Are you having pain? Yes: NPRS scale: 3/10 Pain location: lower cervical and across traps Pain description: aching Aggravating factors: positioning Relieving factors: positioning, heat  PRECAUTIONS: Cervical  WEIGHT BEARING RESTRICTIONS:  no lifting>15 lb   FALLS:  Has patient fallen in last 6 months? No  LIVING ENVIRONMENT: Lives with: lives with their spouse Lives in: House/apartment Stairs: Yes: External:   steps;     OCCUPATION: Chiropodist   PLOF: Independent  PATIENT GOALS: to have more range of motion  NEXT MD VISIT: July 3rd, 2024  OBJECTIVE:   DIAGNOSTIC FINDINGS:  IMPRESSION: 1. Anterior cervical fusion at C6-7. Susceptibility artifact partially obscures evaluation of the neural foramina and central canal. No significant right foraminal stenosis. Left uncovertebral degenerative changes with moderate left foraminal stenosis. 2. At C2-3 there is a minimal broad-based disc bulge. Moderate right facet arthropathy. Mild right foraminal stenosis. 3. At C3-4 there is a minimal broad-based disc bulge. Moderate right facet arthropathy. Mild left foraminal stenosis. 4. At C4-5 there is a minimal broad-based disc bulge. Mild right facet arthropathy. Moderate left foraminal stenosis. 5. At C7-T1 there is a minimal broad-based disc bulge. Mild right and moderate left foraminal stenosis.    IMPRESSION: 1. Anterior cervical fusion at C6-7. Susceptibility artifact partially obscures evaluation of the neural foramina and central canal. No significant right foraminal stenosis. Left uncovertebral degenerative changes with moderate left foraminal stenosis. 2. At C2-3 there is a minimal broad-based disc bulge. Moderate right facet arthropathy. Mild right foraminal stenosis. 3. At C3-4 there is a  minimal broad-based disc bulge. Moderate right facet arthropathy. Mild left foraminal stenosis. 4. At C4-5 there is a minimal broad-based disc bulge. Mild right facet arthropathy. Moderate left foraminal stenosis. 5. At C7-T1 there is a minimal broad-based disc bulge. Mild right and moderate left foraminal stenosis.  PATIENT SURVEYS:  NDI 42% FOTO 45  COGNITION: Overall cognitive status: Within functional limits for tasks assessed  SENSATION: Light touch: WFL; occasional R hand numbness with sleep   POSTURE: rounded shoulders  PALPATION: Hardware present, tenderness at C5-6, C6-7.    CERVICAL ROM:   Active ROM A/PROM (deg) eval PN 5/15:  Flexion 24 39  Extension 25 30  Right lateral flexion 9 20  Left lateral flexion 19 21  Right rotation 18 30  Left rotation 19 31   (Blank rows = not tested)  UPPER EXTREMITY ROM:  Active ROM Right eval Left eval  Shoulder flexion [facility]  Shoulder extension    Shoulder abduction 170 140   (Blank rows = not tested)  UPPER EXTREMITY MMT:  MMT Right eval Left eval  Shoulder flexion 4+ 4+  Shoulder extension 4+ 4+  Shoulder abduction 4+ 4+  Shoulder adduction 4+ 4+  Shoulder extension 4+ 4+  Elbow flexion 4+ 4+  Elbow extension  4+ 4+   (Blank rows = not tested)    TODAY'S TREATMENT:                                                                                                                              DATE: 10/26/22   Manual Therapy:  Sub-Occiput release 5x30 seconds  Cervical rotation with overpressure at [facility] joint 3x30 seconds     There.Ex: UBE: level 4; 2:30 minutes forward, 2:30 minutes backwards. Supine:  Chin tuck: x15 Cervical rotation: x15/side, 2-3 sec holds  Overhead Y GTB for lower trap activation and scapulohumeral rhythm: 3x10 GTB ER 15x  Cervical extension with towel15x   Seated: GTB row 15x  Shoulder rolls 15x  Trigger Point Dry Needling (TDN), unbilled Education performed with patient  regarding potential benefit of TDN. Reviewed precautions and risks with patient. Reviewed special precautions/risks over lung fields which include pneumothorax. Reviewed signs and symptoms of pneumothorax and advised pt to go to ER immediately if these symptoms develop advise them of dry needling treatment. Extensive time spent with pt to ensure full understanding of TDN risks. Pt provided verbal consent to treatment. TDN performed to  with 0.25 x 40 single needle placements with local twitch response (LTR). Pistoning technique utilized. Improved pain-free motion following intervention. Bilateral upper trap in supine position x 3 minutes   PATIENT EDUCATION:  Education details: goals, POC, HEP Person educated: Patient Education method: Explanation, Demonstration, Tactile cues, and Verbal cues Education comprehension: verbalized understanding, returned demonstration, verbal cues required, and tactile cues required  HOME EXERCISE PROGRAM: Access Code: [email or web address] URL: [email or web address] Date: 08/24/2022 Prepared by: Precious Bard  Exercises - Supine Cervical Retraction with Towel  - 1 x daily - 7 x weekly - 2 sets - 10 reps - 5 hold - Seated Scapular Retraction  - 1 x daily - 7 x weekly - 2 sets - 10 reps - 5 hold - Supine Cervical Rotation AROM on Pillow  - 1 x daily - 7 x weekly - 2 sets - 10 reps - 5 hold - Neck Sidebending  - 1 x daily - 7 x weekly - 2 sets - 10 reps - 5 hold - Supine Cervical Flexion Extension on Pillow  - 1 x daily - 7 x weekly - 2 sets - 10 reps - 5 hold  ASSESSMENT:  CLINICAL IMPRESSION: Patient presents with excellent motivation. Large trigger point of R upper trap released with TDN, Postural strengthening tolerated well with decreased need for eternal cueing.  Progression of overhead movements with red theraband to green theraband. Pt will benefit from skilled physical therapy to reduce pain,improve ROM, and return to PLOF.  OBJECTIVE IMPAIRMENTS:  decreased activity tolerance, decreased endurance, decreased ROM, decreased strength, hypomobility, increased muscle spasms, impaired flexibility, improper body mechanics, postural dysfunction, and pain.   ACTIVITY LIMITATIONS: carrying, lifting, sitting, standing, reach over head, and work  PARTICIPATION LIMITATIONS: meal prep, cleaning, laundry, driving, shopping, community activity, occupation, and yard  work  PERSONAL FACTORS: Past/current experiences, Profession, Sex, Time since onset of injury/illness/exacerbation, and 3+ comorbidities: anxiety, CAD, depression, arthritis of cervical region, gastritis, GERD, cancer, stroke, HLD, HTN, IB, lumbar disc disease (fusion x2 ), migraine, numbness of L hand, headaches, and restless leg syndrome  are also affecting patient's functional outcome.   REHAB POTENTIAL: Fair    CLINICAL DECISION MAKING: Evolving/moderate complexity  EVALUATION COMPLEXITY: Moderate   GOALS: Goals reviewed with patient? Yes  SHORT TERM GOALS: Target date: 11/16/2022      Patient will be independent in home exercise program to improve strength/mobility for better functional independence with ADLs. Baseline: 3/27: given 5/15; in progress  Goal status: IN PROGRESS    LONG TERM GOALS: Target date: 12/14/2022      Patient will increase FOTO score to equal to or greater than  55   to demonstrate statistically significant improvement in mobility and quality of life.  Baseline: 45 5/15: 53%  Goal status: Partially Met   2.  Patient will reduce Neck Disability Index score to <10% to demonstrate minimal disability with ADL's including improved sleeping tolerance, sitting tolerance, etc for better mobility at home and work. Baseline: 42% 5/15: 24%  Goal status: Partially Met  3.  Patient will improve cervical ROM by 20 degrees flexion, sidebending, and rotation for improved quality of life and ability to drive.  Baseline: 3/27: see above 5/15: see above Goal  status: Partially Met  4.  Patient will return to full work and recreational activities without compensation or pain increase of >3/10.  Baseline: 3/27: unable to perform 5/15: returned to work, beginning recreation of sewing but needs to take breaks.  Goal status: Partially Met    PLAN:  PT FREQUENCY: 2x/week  PT DURATION: 8 weeks  PLANNED INTERVENTIONS: Therapeutic exercises, Therapeutic activity, Neuromuscular re-education, Balance training, Gait training, Patient/Family education, Self Care, Joint mobilization, Stair training, Vestibular training, Canalith repositioning, Dry Needling, Spinal mobilization, Cryotherapy, Moist heat, scar mobilization, Traction, Ultrasound, Manual therapy, and Re-evaluation  PLAN FOR NEXT SESSION: upper trap release, cervical ROM increase, postural strengthening.   Precious Bard PT  Physical Therapist- [place]  [facility]  10/26/2022, 8:00 AM

## 2022-10-26 ENCOUNTER — Ambulatory Visit: Payer: Commercial Managed Care - PPO

## 2022-10-26 DIAGNOSIS — R293 Abnormal posture: Secondary | ICD-10-CM | POA: Diagnosis not present

## 2022-10-26 DIAGNOSIS — M542 Cervicalgia: Secondary | ICD-10-CM

## 2022-10-26 DIAGNOSIS — R2689 Other abnormalities of gait and mobility: Secondary | ICD-10-CM | POA: Diagnosis not present

## 2022-10-27 ENCOUNTER — Other Ambulatory Visit: Payer: Self-pay

## 2022-11-01 NOTE — Therapy (Signed)
OUTPATIENT PHYSICAL THERAPY CERVICAL TREATMENT   Patient Name: Lori Morales MRN: [id number] DOB:[birth year], 62 y.o., female Today's Date: 11/02/2022  END OF SESSION:  PT End of Session - 11/02/22 0715     Visit Number 13    Number of Visits 27    Date for PT Re-Evaluation 12/14/22    Authorization Type [employer] employee [employer]    Authorization Time Period 08/24/22-10/19/22    Progress Note Due on Visit 10    PT Start Time 0715    PT Stop Time 0759    PT Time Calculation (min) 44 min    Activity Tolerance Patient tolerated treatment well;Patient limited by pain    Behavior During Therapy [facility] for tasks assessed/performed                     Past Medical History:  Diagnosis Date   Arthritis    CAD (coronary artery disease), native coronary artery    mild non calcified plaque in mid LAD and prox LCx on coronary CTA 01/2019   Depression    GERD (gastroesophageal reflux disease)    Headache    migraines   Hx of squamous cell carcinoma 07/11/2016   Right distal dorsum lat forearm    Hypertension    IBS (irritable bowel syndrome)    Palpitations    PFO (patent foramen ovale)    small by cardiac CTA 01/2019   PONV (postoperative nausea and vomiting)    Restless legs syndrome    Squamous cell carcinoma of skin 12/202/2017   right distal dorsum lateral forearm   Past Surgical History:  Procedure Laterality Date   ABDOMINOPLASTY     BACK SURGERY     1 laminectomy, 1 fusion   COLONOSCOPY     COLONOSCOPY WITH PROPOFOL N/A 01/17/2017   Procedure: COLONOSCOPY WITH PROPOFOL;  Surgeon: Christena Deem, MD;  Location: [facility];  Service: Endoscopy;  Laterality: N/A;   COLONOSCOPY WITH PROPOFOL N/A 10/07/2022   Procedure: COLONOSCOPY WITH PROPOFOL;  Surgeon: [name], MD;  Location: [facility];  Service: Endoscopy;  Laterality: N/A;   WISDOM TOOTH EXTRACTION     Patient Active Problem List   Diagnosis Date Noted   Personal history of colonic polyps  10/07/2022   PFO (patent foramen ovale)    CAD (coronary artery disease), native coronary artery    Acute CVA (cerebrovascular accident) (HCC) 06/15/2018   Depression 10/16/2017   Migraine 10/16/2017   Hyperglycemia, unspecified 07/10/2017   Hyperglycemia 07/10/2017   H/O dizziness 08/02/2016   Cervical disc disorder with radiculopathy of cervical region 04/12/2016   Nonallopathic lesion of thoracic region 04/12/2016   Nonallopathic lesion of rib cage 04/12/2016   Facet arthritis of cervical region 03/21/2016   Hyperlipidemia 01/26/2015   Labral tear of shoulder, right, subsequent encounter 12/04/2014   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   Impingement syndrome of right shoulder 05/28/2014   Right supraspinatus tenosynovitis 05/28/2014   Arthritis pain 04/03/2014   Fatigue 04/03/2014   Cervical intraepithelial neoplasia grade 1 06/20/2012   IBS (irritable bowel syndrome) 06/20/2012   Lumbar disc disease 06/20/2012    PCP: Lynnea Ferrier MD  REFERRING PROVIDER: Brown,Christopher R MD  REFERRING DIAG: cervical radiculopathy  THERAPY DIAG:  Cervicalgia  Abnormal posture  Other abnormalities of gait and mobility  Rationale for Evaluation and Treatment: Rehabilitation  ONSET DATE: 06/14/22  SUBJECTIVE:  SUBJECTIVE STATEMENT: Patient leaves to [employer]. Has new onset of severe R scapular pain   Hand dominance: Right  PERTINENT HISTORY:  Patient is returning to PT for s/p cervical fusion C5-7 with bilateral foraminotomies at C6-7 on 06/14/22. Patient has a history of C6-7 arthroplasty and has been treated by this clinician before. PMH includes anxiety, CAD, depression, arthritis of cervical region, gastritis, GERD, cancer, stroke, HLD, HTN, IB, lumbar disc disease (fusion x2 ),  migraine, numbness of L hand, headaches, and restless leg syndrome.  Patient had posterior cervical fusion from C5-C7 and bilateral foraminotomies at C6-7 on 06/14/22; Only restriction is no lifting >15 lb  PAIN:  Are you having pain? Yes: NPRS scale: 3/10 Pain location: lower cervical and across traps Pain description: aching Aggravating factors: positioning Relieving factors: positioning, heat  PRECAUTIONS: Cervical  WEIGHT BEARING RESTRICTIONS:  no lifting>15 lb   FALLS:  Has patient fallen in last 6 months? No  LIVING ENVIRONMENT: Lives with: lives with their spouse Lives in: House/apartment Stairs: Yes: External:   steps;     OCCUPATION: Chiropodist   PLOF: Independent  PATIENT GOALS: to have more range of motion  NEXT MD VISIT: July 3rd, 2024  OBJECTIVE:   DIAGNOSTIC FINDINGS:  IMPRESSION: 1. Anterior cervical fusion at C6-7. Susceptibility artifact partially obscures evaluation of the neural foramina and central canal. No significant right foraminal stenosis. Left uncovertebral degenerative changes with moderate left foraminal stenosis. 2. At C2-3 there is a minimal broad-based disc bulge. Moderate right facet arthropathy. Mild right foraminal stenosis. 3. At C3-4 there is a minimal broad-based disc bulge. Moderate right facet arthropathy. Mild left foraminal stenosis. 4. At C4-5 there is a minimal broad-based disc bulge. Mild right facet arthropathy. Moderate left foraminal stenosis. 5. At C7-T1 there is a minimal broad-based disc bulge. Mild right and moderate left foraminal stenosis.    IMPRESSION: 1. Anterior cervical fusion at C6-7. Susceptibility artifact partially obscures evaluation of the neural foramina and central canal. No significant right foraminal stenosis. Left uncovertebral degenerative changes with moderate left foraminal stenosis. 2. At C2-3 there is a minimal broad-based disc bulge. Moderate right facet arthropathy. Mild right  foraminal stenosis. 3. At C3-4 there is a minimal broad-based disc bulge. Moderate right facet arthropathy. Mild left foraminal stenosis. 4. At C4-5 there is a minimal broad-based disc bulge. Mild right facet arthropathy. Moderate left foraminal stenosis. 5. At C7-T1 there is a minimal broad-based disc bulge. Mild right and moderate left foraminal stenosis.  PATIENT SURVEYS:  NDI 42% FOTO 45  COGNITION: Overall cognitive status: Within functional limits for tasks assessed  SENSATION: Light touch: WFL; occasional R hand numbness with sleep   POSTURE: rounded shoulders  PALPATION: Hardware present, tenderness at C5-6, C6-7.    CERVICAL ROM:   Active ROM A/PROM (deg) eval PN 5/15:  Flexion 24 39  Extension 25 30  Right lateral flexion 9 20  Left lateral flexion 19 21  Right rotation 18 30  Left rotation 19 31   (Blank rows = not tested)  UPPER EXTREMITY ROM:  Active ROM Right eval Left eval  Shoulder flexion [facility]  Shoulder extension    Shoulder abduction 170 140   (Blank rows = not tested)  UPPER EXTREMITY MMT:  MMT Right eval Left eval  Shoulder flexion 4+ 4+  Shoulder extension 4+ 4+  Shoulder abduction 4+ 4+  Shoulder adduction 4+ 4+  Shoulder extension 4+ 4+  Elbow flexion 4+ 4+  Elbow extension 4+ 4+   (  Blank rows = not tested)    TODAY'S TREATMENT:                                                                                                                              DATE: 11/02/22   Manual Therapy:  Sub-Occiput release 5x30 seconds  Cervical rotation with overpressure at [facility] joint 3x30 seconds  STM to R superior scapular angle to inferior angle for scar tissue and fascia manipulation x23 minutes with significant reduction of pain and noticeable scapular depression s/p performance.    There.Ex: Supine : RTB Y 20x  Standing:  Wall ball rollout x 5 Wall low back posture x2 minutes   Trigger Point Dry Needling (TDN),  unbilled Education performed with patient regarding potential benefit of TDN. Reviewed precautions and risks with patient. Reviewed special precautions/risks over lung fields which include pneumothorax. Reviewed signs and symptoms of pneumothorax and advised pt to go to ER immediately if these symptoms develop advise them of dry needling treatment. Extensive time spent with pt to ensure full understanding of TDN risks. Pt provided verbal consent to treatment. TDN performed to  with 0.25 x 40 single needle placements with local twitch response (LTR). Pistoning technique utilized. Improved pain-free motion following intervention. R upper trap in prone position, R piriformis x 3 minutes   PATIENT EDUCATION:  Education details: goals, POC, HEP Person educated: Patient Education method: Explanation, Demonstration, Tactile cues, and Verbal cues Education comprehension: verbalized understanding, returned demonstration, verbal cues required, and tactile cues required  HOME EXERCISE PROGRAM: Access Code: [email or web address] URL: [email or web address] Date: 08/24/2022 Prepared by: Precious Bard  Exercises - Supine Cervical Retraction with Towel  - 1 x daily - 7 x weekly - 2 sets - 10 reps - 5 hold - Seated Scapular Retraction  - 1 x daily - 7 x weekly - 2 sets - 10 reps - 5 hold - Supine Cervical Rotation AROM on Pillow  - 1 x daily - 7 x weekly - 2 sets - 10 reps - 5 hold - Neck Sidebending  - 1 x daily - 7 x weekly - 2 sets - 10 reps - 5 hold - Supine Cervical Flexion Extension on Pillow  - 1 x daily - 7 x weekly - 2 sets - 10 reps - 5 hold  ASSESSMENT:  CLINICAL IMPRESSION: Patient requires extensive manual intervention this session due to significant R medial scapula border impairment. Patient reports significant pain reduction and has visual depression of scapula by end of manual session. Education and performance of trigger point release with balls against surface performed for patient to  perform while on trip. Pt will benefit from skilled physical therapy to reduce pain,improve ROM, and return to PLOF.  OBJECTIVE IMPAIRMENTS: decreased activity tolerance, decreased endurance, decreased ROM, decreased strength, hypomobility, increased muscle spasms, impaired flexibility, improper body mechanics, postural dysfunction, and pain.   ACTIVITY LIMITATIONS: carrying, lifting, sitting, standing, reach over head, and work  PARTICIPATION LIMITATIONS: meal  prep, cleaning, laundry, driving, shopping, community activity, occupation, and yard work  PERSONAL FACTORS: Past/current experiences, Profession, Sex, Time since onset of injury/illness/exacerbation, and 3+ comorbidities: anxiety, CAD, depression, arthritis of cervical region, gastritis, GERD, cancer, stroke, HLD, HTN, IB, lumbar disc disease (fusion x2 ), migraine, numbness of L hand, headaches, and restless leg syndrome  are also affecting patient's functional outcome.   REHAB POTENTIAL: Fair    CLINICAL DECISION MAKING: Evolving/moderate complexity  EVALUATION COMPLEXITY: Moderate   GOALS: Goals reviewed with patient? Yes  SHORT TERM GOALS: Target date: 11/16/2022      Patient will be independent in home exercise program to improve strength/mobility for better functional independence with ADLs. Baseline: 3/27: given 5/15; in progress  Goal status: IN PROGRESS    LONG TERM GOALS: Target date: 12/14/2022      Patient will increase FOTO score to equal to or greater than  55   to demonstrate statistically significant improvement in mobility and quality of life.  Baseline: 45 5/15: 53%  Goal status: Partially Met   2.  Patient will reduce Neck Disability Index score to <10% to demonstrate minimal disability with ADL's including improved sleeping tolerance, sitting tolerance, etc for better mobility at home and work. Baseline: 42% 5/15: 24%  Goal status: Partially Met  3.  Patient will improve cervical ROM by 20  degrees flexion, sidebending, and rotation for improved quality of life and ability to drive.  Baseline: 3/27: see above 5/15: see above Goal status: Partially Met  4.  Patient will return to full work and recreational activities without compensation or pain increase of >3/10.  Baseline: 3/27: unable to perform 5/15: returned to work, beginning recreation of sewing but needs to take breaks.  Goal status: Partially Met    PLAN:  PT FREQUENCY: 2x/week  PT DURATION: 8 weeks  PLANNED INTERVENTIONS: Therapeutic exercises, Therapeutic activity, Neuromuscular re-education, Balance training, Gait training, Patient/Family education, Self Care, Joint mobilization, Stair training, Vestibular training, Canalith repositioning, Dry Needling, Spinal mobilization, Cryotherapy, Moist heat, scar mobilization, Traction, Ultrasound, Manual therapy, and Re-evaluation  PLAN FOR NEXT SESSION: upper trap release, cervical ROM increase, postural strengthening.   Precious Bard PT  Physical Therapist- [place]  [facility]  11/02/2022, 9:21 AM

## 2022-11-02 ENCOUNTER — Ambulatory Visit: Payer: Commercial Managed Care - PPO | Attending: Orthopedic Surgery

## 2022-11-02 DIAGNOSIS — R293 Abnormal posture: Secondary | ICD-10-CM | POA: Diagnosis not present

## 2022-11-02 DIAGNOSIS — M542 Cervicalgia: Secondary | ICD-10-CM | POA: Diagnosis not present

## 2022-11-02 DIAGNOSIS — R2689 Other abnormalities of gait and mobility: Secondary | ICD-10-CM | POA: Diagnosis not present

## 2022-11-15 NOTE — Therapy (Signed)
OUTPATIENT PHYSICAL THERAPY CERVICAL TREATMENT   Patient Name: Lori Morales MRN: [id number] DOB:September 02, 1960, 62 y.o., female Today's Date: 11/16/2022  END OF SESSION:  PT End of Session - 11/16/22 0714     Visit Number 14    Number of Visits 27    Date for PT Re-Evaluation 12/14/22    Authorization Type [employer] employee [employer]    Authorization Time Period 08/24/22-10/19/22    Progress Note Due on Visit 10    PT Start Time 0714    PT Stop Time 0758    PT Time Calculation (min) 44 min    Activity Tolerance Patient tolerated treatment well;Patient limited by pain    Behavior During Therapy [facility] for tasks assessed/performed                      Past Medical History:  Diagnosis Date   Arthritis    CAD (coronary artery disease), native coronary artery    mild non calcified plaque in mid LAD and prox LCx on coronary CTA 01/2019   Depression    GERD (gastroesophageal reflux disease)    Headache    migraines   Hx of squamous cell carcinoma 07/11/2016   Right distal dorsum lat forearm    Hypertension    IBS (irritable bowel syndrome)    Palpitations    PFO (patent foramen ovale)    small by cardiac CTA 01/2019   PONV (postoperative nausea and vomiting)    Restless legs syndrome    Squamous cell carcinoma of skin 12/202/2017   right distal dorsum lateral forearm   Past Surgical History:  Procedure Laterality Date   ABDOMINOPLASTY     BACK SURGERY     1 laminectomy, 1 fusion   COLONOSCOPY     COLONOSCOPY WITH PROPOFOL N/A 01/17/2017   Procedure: COLONOSCOPY WITH PROPOFOL;  Surgeon: Christena Deem, MD;  Location: [facility] ENDOSCOPY;  Service: Endoscopy;  Laterality: N/A;   COLONOSCOPY WITH PROPOFOL N/A 10/07/2022   Procedure: COLONOSCOPY WITH PROPOFOL;  Surgeon: [name], MD;  Location: [facility];  Service: Endoscopy;  Laterality: N/A;   WISDOM TOOTH EXTRACTION     Patient Active Problem List   Diagnosis Date Noted   Personal history of colonic polyps  10/07/2022   PFO (patent foramen ovale)    CAD (coronary artery disease), native coronary artery    Acute CVA (cerebrovascular accident) (HCC) 06/15/2018   Depression 10/16/2017   Migraine 10/16/2017   Hyperglycemia, unspecified 07/10/2017   Hyperglycemia 07/10/2017   H/O dizziness 08/02/2016   Cervical disc disorder with radiculopathy of cervical region 04/12/2016   Nonallopathic lesion of thoracic region 04/12/2016   Nonallopathic lesion of rib cage 04/12/2016   Facet arthritis of cervical region 03/21/2016   Hyperlipidemia 01/26/2015   Labral tear of shoulder, right, subsequent encounter 12/04/2014   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   Impingement syndrome of right shoulder 05/28/2014   Right supraspinatus tenosynovitis 05/28/2014   Arthritis pain 04/03/2014   Fatigue 04/03/2014   Cervical intraepithelial neoplasia grade 1 06/20/2012   IBS (irritable bowel syndrome) 06/20/2012   Lumbar disc disease 06/20/2012    PCP: Lynnea Ferrier MD  REFERRING PROVIDER: Brown,Christopher R MD  REFERRING DIAG: cervical radiculopathy  THERAPY DIAG:  Cervicalgia  Abnormal posture  Other abnormalities of gait and mobility  Rationale for Evaluation and Treatment: Rehabilitation  ONSET DATE: 06/14/22  SUBJECTIVE:  SUBJECTIVE STATEMENT: Patient has returned from her trip to [place]. Having significant R upper trap tightness.   Hand dominance: Right  PERTINENT HISTORY:  Patient is returning to PT for s/p cervical fusion C5-7 with bilateral foraminotomies at C6-7 on 06/14/22. Patient has a history of C6-7 arthroplasty and has been treated by this clinician before. PMH includes anxiety, CAD, depression, arthritis of cervical region, gastritis, GERD, cancer, stroke, HLD, HTN, IB, lumbar disc disease  (fusion x2 ), migraine, numbness of L hand, headaches, and restless leg syndrome.  Patient had posterior cervical fusion from C5-C7 and bilateral foraminotomies at C6-7 on 06/14/22; Only restriction is no lifting >15 lb  PAIN:  Are you having pain? Yes: NPRS scale: 3/10 Pain location: lower cervical and across traps Pain description: aching Aggravating factors: positioning Relieving factors: positioning, heat  PRECAUTIONS: Cervical  WEIGHT BEARING RESTRICTIONS:  no lifting>15 lb   FALLS:  Has patient fallen in last 6 months? No  LIVING ENVIRONMENT: Lives with: lives with their spouse Lives in: House/apartment Stairs: Yes: External:   steps;     OCCUPATION: Chiropodist   PLOF: Independent  PATIENT GOALS: to have more range of motion  NEXT MD VISIT: July 3rd, 2024  OBJECTIVE:   DIAGNOSTIC FINDINGS:  IMPRESSION: 1. Anterior cervical fusion at C6-7. Susceptibility artifact partially obscures evaluation of the neural foramina and central canal. No significant right foraminal stenosis. Left uncovertebral degenerative changes with moderate left foraminal stenosis. 2. At C2-3 there is a minimal broad-based disc bulge. Moderate right facet arthropathy. Mild right foraminal stenosis. 3. At C3-4 there is a minimal broad-based disc bulge. Moderate right facet arthropathy. Mild left foraminal stenosis. 4. At C4-5 there is a minimal broad-based disc bulge. Mild right facet arthropathy. Moderate left foraminal stenosis. 5. At C7-T1 there is a minimal broad-based disc bulge. Mild right and moderate left foraminal stenosis.    IMPRESSION: 1. Anterior cervical fusion at C6-7. Susceptibility artifact partially obscures evaluation of the neural foramina and central canal. No significant right foraminal stenosis. Left uncovertebral degenerative changes with moderate left foraminal stenosis. 2. At C2-3 there is a minimal broad-based disc bulge. Moderate right facet arthropathy.  Mild right foraminal stenosis. 3. At C3-4 there is a minimal broad-based disc bulge. Moderate right facet arthropathy. Mild left foraminal stenosis. 4. At C4-5 there is a minimal broad-based disc bulge. Mild right facet arthropathy. Moderate left foraminal stenosis. 5. At C7-T1 there is a minimal broad-based disc bulge. Mild right and moderate left foraminal stenosis.  PATIENT SURVEYS:  NDI 42% FOTO 45  COGNITION: Overall cognitive status: Within functional limits for tasks assessed  SENSATION: Light touch: WFL; occasional R hand numbness with sleep   POSTURE: rounded shoulders  PALPATION: Hardware present, tenderness at C5-6, C6-7.    CERVICAL ROM:   Active ROM A/PROM (deg) eval PN 5/15:  Flexion 24 39  Extension 25 30  Right lateral flexion 9 20  Left lateral flexion 19 21  Right rotation 18 30  Left rotation 19 31   (Blank rows = not tested)  UPPER EXTREMITY ROM:  Active ROM Right eval Left eval  Shoulder flexion [facility]  Shoulder extension    Shoulder abduction 170 140   (Blank rows = not tested)  UPPER EXTREMITY MMT:  MMT Right eval Left eval  Shoulder flexion 4+ 4+  Shoulder extension 4+ 4+  Shoulder abduction 4+ 4+  Shoulder adduction 4+ 4+  Shoulder extension 4+ 4+  Elbow flexion 4+ 4+  Elbow extension 4+ 4+   (  Blank rows = not tested)    TODAY'S TREATMENT:                                                                                                                              DATE: 11/16/22   Manual Therapy:  Sub-Occiput release 5x30 seconds  Cervical rotation with overpressure at [facility] joint 3x30 seconds    There.Ex: Supine : RTB Y 20x ER RTB 20x  Cervical extension with towel distraction 15x   Seated  Row GTB 15x Wall low back posture x2 minutes  L stretch 30 seconds x 2 trials   Trigger Point Dry Needling (TDN), unbilled Education performed with patient regarding potential benefit of TDN. Reviewed precautions and risks with  patient. Reviewed special precautions/risks over lung fields which include pneumothorax. Reviewed signs and symptoms of pneumothorax and advised pt to go to ER immediately if these symptoms develop advise them of dry needling treatment. Extensive time spent with pt to ensure full understanding of TDN risks. Pt provided verbal consent to treatment. TDN performed to  with 0.25 x 40 single needle placements with local twitch response (LTR). Pistoning technique utilized. Improved pain-free motion following intervention. Bilateral upper trap in prone and supine position x 4 minutes   PATIENT EDUCATION:  Education details: goals, POC, HEP Person educated: Patient Education method: Explanation, Demonstration, Tactile cues, and Verbal cues Education comprehension: verbalized understanding, returned demonstration, verbal cues required, and tactile cues required  HOME EXERCISE PROGRAM: Access Code: [email or web address] URL: [email or web address] Date: 08/24/2022 Prepared by: Precious Bard  Exercises - Supine Cervical Retraction with Towel  - 1 x daily - 7 x weekly - 2 sets - 10 reps - 5 hold - Seated Scapular Retraction  - 1 x daily - 7 x weekly - 2 sets - 10 reps - 5 hold - Supine Cervical Rotation AROM on Pillow  - 1 x daily - 7 x weekly - 2 sets - 10 reps - 5 hold - Neck Sidebending  - 1 x daily - 7 x weekly - 2 sets - 10 reps - 5 hold - Supine Cervical Flexion Extension on Pillow  - 1 x daily - 7 x weekly - 2 sets - 10 reps - 5 hold  ASSESSMENT:  CLINICAL IMPRESSION: Patient has returned from her trip to [place] with significant tension of R upper trap. She requires extensive TDN and muscle tissue lengthening to correct muscle alignment and posture for pain reduction. Posture strengthening interventions tolerated well. Patient educated on utilization of posture interventions during work week such as wall reset.  Pt will benefit from skilled physical therapy to reduce pain,improve ROM, and  return to PLOF.  OBJECTIVE IMPAIRMENTS: decreased activity tolerance, decreased endurance, decreased ROM, decreased strength, hypomobility, increased muscle spasms, impaired flexibility, improper body mechanics, postural dysfunction, and pain.   ACTIVITY LIMITATIONS: carrying, lifting, sitting, standing, reach over head, and work  PARTICIPATION LIMITATIONS: meal prep, cleaning, laundry, driving, shopping, community activity, occupation, and yard  work  PERSONAL FACTORS: Past/current experiences, Profession, Sex, Time since onset of injury/illness/exacerbation, and 3+ comorbidities: anxiety, CAD, depression, arthritis of cervical region, gastritis, GERD, cancer, stroke, HLD, HTN, IB, lumbar disc disease (fusion x2 ), migraine, numbness of L hand, headaches, and restless leg syndrome  are also affecting patient's functional outcome.   REHAB POTENTIAL: Fair    CLINICAL DECISION MAKING: Evolving/moderate complexity  EVALUATION COMPLEXITY: Moderate   GOALS: Goals reviewed with patient? Yes  SHORT TERM GOALS: Target date: 11/16/2022      Patient will be independent in home exercise program to improve strength/mobility for better functional independence with ADLs. Baseline: 3/27: given 5/15; in progress  Goal status: IN PROGRESS    LONG TERM GOALS: Target date: 12/14/2022      Patient will increase FOTO score to equal to or greater than  55   to demonstrate statistically significant improvement in mobility and quality of life.  Baseline: 45 5/15: 53%  Goal status: Partially Met   2.  Patient will reduce Neck Disability Index score to <10% to demonstrate minimal disability with ADL's including improved sleeping tolerance, sitting tolerance, etc for better mobility at home and work. Baseline: 42% 5/15: 24%  Goal status: Partially Met  3.  Patient will improve cervical ROM by 20 degrees flexion, sidebending, and rotation for improved quality of life and ability to drive.  Baseline:  3/27: see above 5/15: see above Goal status: Partially Met  4.  Patient will return to full work and recreational activities without compensation or pain increase of >3/10.  Baseline: 3/27: unable to perform 5/15: returned to work, beginning recreation of sewing but needs to take breaks.  Goal status: Partially Met    PLAN:  PT FREQUENCY: 2x/week  PT DURATION: 8 weeks  PLANNED INTERVENTIONS: Therapeutic exercises, Therapeutic activity, Neuromuscular re-education, Balance training, Gait training, Patient/Family education, Self Care, Joint mobilization, Stair training, Vestibular training, Canalith repositioning, Dry Needling, Spinal mobilization, Cryotherapy, Moist heat, scar mobilization, Traction, Ultrasound, Manual therapy, and Re-evaluation  PLAN FOR NEXT SESSION: upper trap release, cervical ROM increase, postural strengthening.   Precious Bard PT  Physical Therapist- [place]  [facility]  11/16/2022, 7:59 AM

## 2022-11-16 ENCOUNTER — Ambulatory Visit: Payer: Commercial Managed Care - PPO

## 2022-11-16 DIAGNOSIS — M542 Cervicalgia: Secondary | ICD-10-CM

## 2022-11-16 DIAGNOSIS — R293 Abnormal posture: Secondary | ICD-10-CM

## 2022-11-16 DIAGNOSIS — R2689 Other abnormalities of gait and mobility: Secondary | ICD-10-CM

## 2022-11-17 ENCOUNTER — Other Ambulatory Visit: Payer: Self-pay

## 2022-11-17 MED ORDER — LOSARTAN POTASSIUM 100 MG PO TABS
100.0000 mg | ORAL_TABLET | Freq: Every day | ORAL | 3 refills | Status: DC
  Filled 2022-11-17: qty 90, 90d supply, fill #0

## 2022-11-18 ENCOUNTER — Other Ambulatory Visit: Payer: Self-pay

## 2022-11-18 MED ORDER — ESTRADIOL 10 MCG VA TABS
10.0000 ug | ORAL_TABLET | VAGINAL | 4 refills | Status: AC
  Filled 2022-11-18: qty 24, 84d supply, fill #0

## 2022-11-18 MED ORDER — AMITRIPTYLINE HCL 10 MG PO TABS
10.0000 mg | ORAL_TABLET | Freq: Every day | ORAL | 3 refills | Status: DC
  Filled 2022-11-18: qty 90, 90d supply, fill #0

## 2022-11-21 ENCOUNTER — Other Ambulatory Visit: Payer: Self-pay

## 2022-11-21 NOTE — Therapy (Signed)
OUTPATIENT PHYSICAL THERAPY CERVICAL TREATMENT   Patient Name: Lori Morales MRN: [id number] DOB:02-13-1961, 62 y.o., female Today's Date: 11/22/2022  END OF SESSION:  PT End of Session - 11/22/22 0709     Visit Number 15    Number of Visits 27    Date for PT Re-Evaluation 12/14/22    Authorization Type [employer] employee [employer]    Authorization Time Period 08/24/22-10/19/22    Progress Note Due on Visit 10    PT Start Time 0710    PT Stop Time 0755    PT Time Calculation (min) 45 min    Activity Tolerance Patient tolerated treatment well;Patient limited by pain    Behavior During Therapy [facility] for tasks assessed/performed                       Past Medical History:  Diagnosis Date   Arthritis    CAD (coronary artery disease), native coronary artery    mild non calcified plaque in mid LAD and prox LCx on coronary CTA 01/2019   Depression    GERD (gastroesophageal reflux disease)    Headache    migraines   Hx of squamous cell carcinoma 07/11/2016   Right distal dorsum lat forearm    Hypertension    IBS (irritable bowel syndrome)    Palpitations    PFO (patent foramen ovale)    small by cardiac CTA 01/2019   PONV (postoperative nausea and vomiting)    Restless legs syndrome    Squamous cell carcinoma of skin 12/202/2017   right distal dorsum lateral forearm   Past Surgical History:  Procedure Laterality Date   ABDOMINOPLASTY     BACK SURGERY     1 laminectomy, 1 fusion   COLONOSCOPY     COLONOSCOPY WITH PROPOFOL N/A 01/17/2017   Procedure: COLONOSCOPY WITH PROPOFOL;  Surgeon: Christena Deem, MD;  Location: [facility] ENDOSCOPY;  Service: Endoscopy;  Laterality: N/A;   COLONOSCOPY WITH PROPOFOL N/A 10/07/2022   Procedure: COLONOSCOPY WITH PROPOFOL;  Surgeon: [name], MD;  Location: [facility];  Service: Endoscopy;  Laterality: N/A;   WISDOM TOOTH EXTRACTION     Patient Active Problem List   Diagnosis Date Noted   Personal history of colonic polyps  10/07/2022   PFO (patent foramen ovale)    CAD (coronary artery disease), native coronary artery    Acute CVA (cerebrovascular accident) (HCC) 06/15/2018   Depression 10/16/2017   Migraine 10/16/2017   Hyperglycemia, unspecified 07/10/2017   Hyperglycemia 07/10/2017   H/O dizziness 08/02/2016   Cervical disc disorder with radiculopathy of cervical region 04/12/2016   Nonallopathic lesion of thoracic region 04/12/2016   Nonallopathic lesion of rib cage 04/12/2016   Facet arthritis of cervical region 03/21/2016   Hyperlipidemia 01/26/2015   Labral tear of shoulder, right, subsequent encounter 12/04/2014   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   Impingement syndrome of right shoulder 05/28/2014   Right supraspinatus tenosynovitis 05/28/2014   Arthritis pain 04/03/2014   Fatigue 04/03/2014   Cervical intraepithelial neoplasia grade 1 06/20/2012   IBS (irritable bowel syndrome) 06/20/2012   Lumbar disc disease 06/20/2012    PCP: Lynnea Ferrier MD  REFERRING PROVIDER: Brown,Christopher R MD  REFERRING DIAG: cervical radiculopathy  THERAPY DIAG:  Cervicalgia  Abnormal posture  Other abnormalities of gait and mobility  Rationale for Evaluation and Treatment: Rehabilitation  ONSET DATE: 06/14/22  SUBJECTIVE:  SUBJECTIVE STATEMENT: Patient reports L shoulder pain s/p cleaning bathroom. 4/10 pain  Hand dominance: Right  PERTINENT HISTORY:  Patient is returning to PT for s/p cervical fusion C5-7 with bilateral foraminotomies at C6-7 on 06/14/22. Patient has a history of C6-7 arthroplasty and has been treated by this clinician before. PMH includes anxiety, CAD, depression, arthritis of cervical region, gastritis, GERD, cancer, stroke, HLD, HTN, IB, lumbar disc disease (fusion x2 ), migraine,  numbness of L hand, headaches, and restless leg syndrome.  Patient had posterior cervical fusion from C5-C7 and bilateral foraminotomies at C6-7 on 06/14/22; Only restriction is no lifting >15 lb  PAIN:  Are you having pain? Yes: NPRS scale: 3/10 Pain location: lower cervical and across traps Pain description: aching Aggravating factors: positioning Relieving factors: positioning, heat  PRECAUTIONS: Cervical  WEIGHT BEARING RESTRICTIONS:  no lifting>15 lb   FALLS:  Has patient fallen in last 6 months? No  LIVING ENVIRONMENT: Lives with: lives with their spouse Lives in: House/apartment Stairs: Yes: External:   steps;     OCCUPATION: Chiropodist   PLOF: Independent  PATIENT GOALS: to have more range of motion  NEXT MD VISIT: July 3rd, 2024  OBJECTIVE:   DIAGNOSTIC FINDINGS:  IMPRESSION: 1. Anterior cervical fusion at C6-7. Susceptibility artifact partially obscures evaluation of the neural foramina and central canal. No significant right foraminal stenosis. Left uncovertebral degenerative changes with moderate left foraminal stenosis. 2. At C2-3 there is a minimal broad-based disc bulge. Moderate right facet arthropathy. Mild right foraminal stenosis. 3. At C3-4 there is a minimal broad-based disc bulge. Moderate right facet arthropathy. Mild left foraminal stenosis. 4. At C4-5 there is a minimal broad-based disc bulge. Mild right facet arthropathy. Moderate left foraminal stenosis. 5. At C7-T1 there is a minimal broad-based disc bulge. Mild right and moderate left foraminal stenosis.    IMPRESSION: 1. Anterior cervical fusion at C6-7. Susceptibility artifact partially obscures evaluation of the neural foramina and central canal. No significant right foraminal stenosis. Left uncovertebral degenerative changes with moderate left foraminal stenosis. 2. At C2-3 there is a minimal broad-based disc bulge. Moderate right facet arthropathy. Mild right foraminal  stenosis. 3. At C3-4 there is a minimal broad-based disc bulge. Moderate right facet arthropathy. Mild left foraminal stenosis. 4. At C4-5 there is a minimal broad-based disc bulge. Mild right facet arthropathy. Moderate left foraminal stenosis. 5. At C7-T1 there is a minimal broad-based disc bulge. Mild right and moderate left foraminal stenosis.  PATIENT SURVEYS:  NDI 42% FOTO 45  COGNITION: Overall cognitive status: Within functional limits for tasks assessed  SENSATION: Light touch: WFL; occasional R hand numbness with sleep   POSTURE: rounded shoulders  PALPATION: Hardware present, tenderness at C5-6, C6-7.    CERVICAL ROM:   Active ROM A/PROM (deg) eval PN 5/15:  Flexion 24 39  Extension 25 30  Right lateral flexion 9 20  Left lateral flexion 19 21  Right rotation 18 30  Left rotation 19 31   (Blank rows = not tested)  UPPER EXTREMITY ROM:  Active ROM Right eval Left eval  Shoulder flexion [facility]  Shoulder extension    Shoulder abduction 170 140   (Blank rows = not tested)  UPPER EXTREMITY MMT:  MMT Right eval Left eval  Shoulder flexion 4+ 4+  Shoulder extension 4+ 4+  Shoulder abduction 4+ 4+  Shoulder adduction 4+ 4+  Shoulder extension 4+ 4+  Elbow flexion 4+ 4+  Elbow extension 4+ 4+   (Blank rows = not  tested)    TODAY'S TREATMENT:                                                                                                                              DATE: 11/22/22   Manual Therapy:  Sub-Occiput release 5x30 seconds  Cervical rotation with overpressure at [facility] joint 3x30 seconds  Grade II thoracic mobilization x 3 minutes   There.Ex: Supine : RTB Y 20x ER RTB 20x  Chin tuck 12x 5 second holds    Standing : 12.5 lb row 12x; 2 sets ; two handles   SciFit: posterior rotation for posterior chain recruitment 3 minutes x 2 trials for focus on posterior stabilization and posture. Lvl 4; seat position 4   Trigger Point Dry  Needling (TDN), unbilled Education performed with patient regarding potential benefit of TDN. Reviewed precautions and risks with patient. Reviewed special precautions/risks over lung fields which include pneumothorax. Reviewed signs and symptoms of pneumothorax and advised pt to go to ER immediately if these symptoms develop advise them of dry needling treatment. Extensive time spent with pt to ensure full understanding of TDN risks. Pt provided verbal consent to treatment. TDN performed to  with 0.25 x 40 single needle placements with local twitch response (LTR). Pistoning technique utilized. Improved pain-free motion following intervention. Prone position: L upper trap and infraspinatus x 4 minutes   PATIENT EDUCATION:  Education details: goals, POC, HEP Person educated: Patient Education method: Explanation, Demonstration, Tactile cues, and Verbal cues Education comprehension: verbalized understanding, returned demonstration, verbal cues required, and tactile cues required  HOME EXERCISE PROGRAM: Access Code: [email or web address] URL: [email or web address] Date: 08/24/2022 Prepared by: Precious Bard  Exercises - Supine Cervical Retraction with Towel  - 1 x daily - 7 x weekly - 2 sets - 10 reps - 5 hold - Seated Scapular Retraction  - 1 x daily - 7 x weekly - 2 sets - 10 reps - 5 hold - Supine Cervical Rotation AROM on Pillow  - 1 x daily - 7 x weekly - 2 sets - 10 reps - 5 hold - Neck Sidebending  - 1 x daily - 7 x weekly - 2 sets - 10 reps - 5 hold - Supine Cervical Flexion Extension on Pillow  - 1 x daily - 7 x weekly - 2 sets - 10 reps - 5 hold  ASSESSMENT:  CLINICAL IMPRESSION: Patient has significant L infraspinatus and upper trap tension with large trigger poits released with TDN. Posterior chain recruitment and posture stabilization/strengthening exercises tolerated well.   Pt will benefit from skilled physical therapy to reduce pain,improve ROM, and return to PLOF.  OBJECTIVE  IMPAIRMENTS: decreased activity tolerance, decreased endurance, decreased ROM, decreased strength, hypomobility, increased muscle spasms, impaired flexibility, improper body mechanics, postural dysfunction, and pain.   ACTIVITY LIMITATIONS: carrying, lifting, sitting, standing, reach over head, and work  PARTICIPATION LIMITATIONS: meal prep, cleaning, laundry, driving, shopping, community activity, occupation, and yard work  PERSONAL FACTORS:  Past/current experiences, Profession, Sex, Time since onset of injury/illness/exacerbation, and 3+ comorbidities: anxiety, CAD, depression, arthritis of cervical region, gastritis, GERD, cancer, stroke, HLD, HTN, IB, lumbar disc disease (fusion x2 ), migraine, numbness of L hand, headaches, and restless leg syndrome  are also affecting patient's functional outcome.   REHAB POTENTIAL: Fair    CLINICAL DECISION MAKING: Evolving/moderate complexity  EVALUATION COMPLEXITY: Moderate   GOALS: Goals reviewed with patient? Yes  SHORT TERM GOALS: Target date: 11/16/2022      Patient will be independent in home exercise program to improve strength/mobility for better functional independence with ADLs. Baseline: 3/27: given 5/15; in progress  Goal status: IN PROGRESS    LONG TERM GOALS: Target date: 12/14/2022      Patient will increase FOTO score to equal to or greater than  55   to demonstrate statistically significant improvement in mobility and quality of life.  Baseline: 45 5/15: 53%  Goal status: Partially Met   2.  Patient will reduce Neck Disability Index score to <10% to demonstrate minimal disability with ADL's including improved sleeping tolerance, sitting tolerance, etc for better mobility at home and work. Baseline: 42% 5/15: 24%  Goal status: Partially Met  3.  Patient will improve cervical ROM by 20 degrees flexion, sidebending, and rotation for improved quality of life and ability to drive.  Baseline: 3/27: see above 5/15: see  above Goal status: Partially Met  4.  Patient will return to full work and recreational activities without compensation or pain increase of >3/10.  Baseline: 3/27: unable to perform 5/15: returned to work, beginning recreation of sewing but needs to take breaks.  Goal status: Partially Met    PLAN:  PT FREQUENCY: 2x/week  PT DURATION: 8 weeks  PLANNED INTERVENTIONS: Therapeutic exercises, Therapeutic activity, Neuromuscular re-education, Balance training, Gait training, Patient/Family education, Self Care, Joint mobilization, Stair training, Vestibular training, Canalith repositioning, Dry Needling, Spinal mobilization, Cryotherapy, Moist heat, scar mobilization, Traction, Ultrasound, Manual therapy, and Re-evaluation  PLAN FOR NEXT SESSION: upper trap release, cervical ROM increase, postural strengthening.   Precious Bard PT  Physical Therapist- [place]  [facility]  11/22/2022, 7:56 AM

## 2022-11-22 ENCOUNTER — Ambulatory Visit: Payer: Commercial Managed Care - PPO

## 2022-11-22 DIAGNOSIS — R2689 Other abnormalities of gait and mobility: Secondary | ICD-10-CM

## 2022-11-22 DIAGNOSIS — M542 Cervicalgia: Secondary | ICD-10-CM

## 2022-11-22 DIAGNOSIS — R293 Abnormal posture: Secondary | ICD-10-CM

## 2022-11-28 NOTE — Therapy (Signed)
OUTPATIENT PHYSICAL THERAPY CERVICAL TREATMENT   Patient Name: Lori Morales MRN: [id number] DOB:12/20/1960, 62 y.o., female Today's Date: 11/29/2022  END OF SESSION:  PT End of Session - 11/29/22 0715     Visit Number 16    Number of Visits 27    Date for PT Re-Evaluation 12/14/22    Authorization Type [employer] employee [employer]    Authorization Time Period 08/24/22-10/19/22    Progress Note Due on Visit 10    PT Start Time 0715    PT Stop Time 0759    PT Time Calculation (min) 44 min    Activity Tolerance Patient tolerated treatment well;Patient limited by pain    Behavior During Therapy [facility] for tasks assessed/performed                        Past Medical History:  Diagnosis Date   Arthritis    CAD (coronary artery disease), native coronary artery    mild non calcified plaque in mid LAD and prox LCx on coronary CTA 01/2019   Depression    GERD (gastroesophageal reflux disease)    Headache    migraines   Hx of squamous cell carcinoma 07/11/2016   Right distal dorsum lat forearm    Hypertension    IBS (irritable bowel syndrome)    Palpitations    PFO (patent foramen ovale)    small by cardiac CTA 01/2019   PONV (postoperative nausea and vomiting)    Restless legs syndrome    Squamous cell carcinoma of skin 12/202/2017   right distal dorsum lateral forearm   Past Surgical History:  Procedure Laterality Date   ABDOMINOPLASTY     BACK SURGERY     1 laminectomy, 1 fusion   COLONOSCOPY     COLONOSCOPY WITH PROPOFOL N/A 01/17/2017   Procedure: COLONOSCOPY WITH PROPOFOL;  Surgeon: Christena Deem, MD;  Location: [facility]COPY;  Service: Endoscopy;  Laterality: N/A;   COLONOSCOPY WITH PROPOFOL N/A 10/07/2022   Procedure: COLONOSCOPY WITH PROPOFOL;  Surgeon: [name], MD;  Location: [facility];  Service: Endoscopy;  Laterality: N/A;   WISDOM TOOTH EXTRACTION     Patient Active Problem List   Diagnosis Date Noted   Personal history of colonic polyps  10/07/2022   PFO (patent foramen ovale)    CAD (coronary artery disease), native coronary artery    Acute CVA (cerebrovascular accident) (HCC) 06/15/2018   Depression 10/16/2017   Migraine 10/16/2017   Hyperglycemia, unspecified 07/10/2017   Hyperglycemia 07/10/2017   H/O dizziness 08/02/2016   Cervical disc disorder with radiculopathy of cervical region 04/12/2016   Nonallopathic lesion of thoracic region 04/12/2016   Nonallopathic lesion of rib cage 04/12/2016   Facet arthritis of cervical region 03/21/2016   Hyperlipidemia 01/26/2015   Labral tear of shoulder, right, subsequent encounter 12/04/2014   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   Impingement syndrome of right shoulder 05/28/2014   Right supraspinatus tenosynovitis 05/28/2014   Arthritis pain 04/03/2014   Fatigue 04/03/2014   Cervical intraepithelial neoplasia grade 1 06/20/2012   IBS (irritable bowel syndrome) 06/20/2012   Lumbar disc disease 06/20/2012    PCP: Lynnea Ferrier MD  REFERRING PROVIDER: Brown,Christopher R MD  REFERRING DIAG: cervical radiculopathy  THERAPY DIAG:  Cervicalgia  Abnormal posture  Other abnormalities of gait and mobility  Rationale for Evaluation and Treatment: Rehabilitation  ONSET DATE: 06/14/22  SUBJECTIVE:  SUBJECTIVE STATEMENT: Patient reports she did some quilting work. Continues to have intermittent tightness, is trying to remember to stretch during sessions.   Hand dominance: Right  PERTINENT HISTORY:  Patient is returning to PT for s/p cervical fusion C5-7 with bilateral foraminotomies at C6-7 on 06/14/22. Patient has a history of C6-7 arthroplasty and has been treated by this clinician before. PMH includes anxiety, CAD, depression, arthritis of cervical region, gastritis, GERD, cancer,  stroke, HLD, HTN, IB, lumbar disc disease (fusion x2 ), migraine, numbness of L hand, headaches, and restless leg syndrome.  Patient had posterior cervical fusion from C5-C7 and bilateral foraminotomies at C6-7 on 06/14/22; Only restriction is no lifting >15 lb  PAIN:  Are you having pain? Yes: NPRS scale: 3/10 Pain location: lower cervical and across traps Pain description: aching Aggravating factors: positioning Relieving factors: positioning, heat  PRECAUTIONS: Cervical  WEIGHT BEARING RESTRICTIONS:  no lifting>15 lb   FALLS:  Has patient fallen in last 6 months? No  LIVING ENVIRONMENT: Lives with: lives with their spouse Lives in: House/apartment Stairs: Yes: External:   steps;     OCCUPATION: Chiropodist   PLOF: Independent  PATIENT GOALS: to have more range of motion  NEXT MD VISIT: July 3rd, 2024  OBJECTIVE:   DIAGNOSTIC FINDINGS:  IMPRESSION: 1. Anterior cervical fusion at C6-7. Susceptibility artifact partially obscures evaluation of the neural foramina and central canal. No significant right foraminal stenosis. Left uncovertebral degenerative changes with moderate left foraminal stenosis. 2. At C2-3 there is a minimal broad-based disc bulge. Moderate right facet arthropathy. Mild right foraminal stenosis. 3. At C3-4 there is a minimal broad-based disc bulge. Moderate right facet arthropathy. Mild left foraminal stenosis. 4. At C4-5 there is a minimal broad-based disc bulge. Mild right facet arthropathy. Moderate left foraminal stenosis. 5. At C7-T1 there is a minimal broad-based disc bulge. Mild right and moderate left foraminal stenosis.    IMPRESSION: 1. Anterior cervical fusion at C6-7. Susceptibility artifact partially obscures evaluation of the neural foramina and central canal. No significant right foraminal stenosis. Left uncovertebral degenerative changes with moderate left foraminal stenosis. 2. At C2-3 there is a minimal broad-based disc  bulge. Moderate right facet arthropathy. Mild right foraminal stenosis. 3. At C3-4 there is a minimal broad-based disc bulge. Moderate right facet arthropathy. Mild left foraminal stenosis. 4. At C4-5 there is a minimal broad-based disc bulge. Mild right facet arthropathy. Moderate left foraminal stenosis. 5. At C7-T1 there is a minimal broad-based disc bulge. Mild right and moderate left foraminal stenosis.  PATIENT SURVEYS:  NDI 42% FOTO 45  COGNITION: Overall cognitive status: Within functional limits for tasks assessed  SENSATION: Light touch: WFL; occasional R hand numbness with sleep   POSTURE: rounded shoulders  PALPATION: Hardware present, tenderness at C5-6, C6-7.    CERVICAL ROM:   Active ROM A/PROM (deg) eval PN 5/15:  Flexion 24 39  Extension 25 30  Right lateral flexion 9 20  Left lateral flexion 19 21  Right rotation 18 30  Left rotation 19 31   (Blank rows = not tested)  UPPER EXTREMITY ROM:  Active ROM Right eval Left eval  Shoulder flexion [facility]  Shoulder extension    Shoulder abduction 170 140   (Blank rows = not tested)  UPPER EXTREMITY MMT:  MMT Right eval Left eval  Shoulder flexion 4+ 4+  Shoulder extension 4+ 4+  Shoulder abduction 4+ 4+  Shoulder adduction 4+ 4+  Shoulder extension 4+ 4+  Elbow flexion 4+ 4+  Elbow extension 4+ 4+   (Blank rows = not tested)    TODAY'S TREATMENT:                                                                                                                              DATE: 11/29/22   Manual Therapy:  Sub-Occiput release 5x30 seconds  Cervical PA mobs supine x 2 minutes Grade II thoracic mobilization x 3 minutes   There.Ex: Prone over green ball: -I', Y', W's 10x each   Standing at wall: -modified I's, Y's W's 5x each   Education on upper trap stretching for reduction of tightening throughout session.    Trigger Point Dry Needling (TDN), unbilled Education performed with  patient regarding potential benefit of TDN. Reviewed precautions and risks with patient. Reviewed special precautions/risks over lung fields which include pneumothorax. Reviewed signs and symptoms of pneumothorax and advised pt to go to ER immediately if these symptoms develop advise them of dry needling treatment. Extensive time spent with pt to ensure full understanding of TDN risks. Pt provided verbal consent to treatment. TDN performed to  with 0.25 x 40 single needle placements with local twitch response (LTR). Pistoning technique utilized. Improved pain-free motion following intervention. Prone position: L and R  upper trap x 4 minutes   PATIENT EDUCATION:  Education details: goals, POC, HEP Person educated: Patient Education method: Explanation, Demonstration, Tactile cues, and Verbal cues Education comprehension: verbalized understanding, returned demonstration, verbal cues required, and tactile cues required  HOME EXERCISE PROGRAM: Access Code: [email or web address] URL: [email or web address] Date: 08/24/2022 Prepared by: Precious Bard  Exercises - Supine Cervical Retraction with Towel  - 1 x daily - 7 x weekly - 2 sets - 10 reps - 5 hold - Seated Scapular Retraction  - 1 x daily - 7 x weekly - 2 sets - 10 reps - 5 hold - Supine Cervical Rotation AROM on Pillow  - 1 x daily - 7 x weekly - 2 sets - 10 reps - 5 hold - Neck Sidebending  - 1 x daily - 7 x weekly - 2 sets - 10 reps - 5 hold - Supine Cervical Flexion Extension on Pillow  - 1 x daily - 7 x weekly - 2 sets - 10 reps - 5 hold  ASSESSMENT:  CLINICAL IMPRESSION: Patient is highly motivated throughout session. Education on postural strengthening performed well with patient reporting fatigue. Due to patient not having ball at home, wall positioned exercise without equipment added to POC.    Pt will benefit from skilled physical therapy to reduce pain,improve ROM, and return to PLOF.  OBJECTIVE IMPAIRMENTS: decreased activity  tolerance, decreased endurance, decreased ROM, decreased strength, hypomobility, increased muscle spasms, impaired flexibility, improper body mechanics, postural dysfunction, and pain.   ACTIVITY LIMITATIONS: carrying, lifting, sitting, standing, reach over head, and work  PARTICIPATION LIMITATIONS: meal prep, cleaning, laundry, driving, shopping, community activity, occupation, and yard work  PERSONAL FACTORS: Past/current experiences, Profession, Sex, Time  since onset of injury/illness/exacerbation, and 3+ comorbidities: anxiety, CAD, depression, arthritis of cervical region, gastritis, GERD, cancer, stroke, HLD, HTN, IB, lumbar disc disease (fusion x2 ), migraine, numbness of L hand, headaches, and restless leg syndrome  are also affecting patient's functional outcome.   REHAB POTENTIAL: Fair    CLINICAL DECISION MAKING: Evolving/moderate complexity  EVALUATION COMPLEXITY: Moderate   GOALS: Goals reviewed with patient? Yes  SHORT TERM GOALS: Target date: 11/16/2022      Patient will be independent in home exercise program to improve strength/mobility for better functional independence with ADLs. Baseline: 3/27: given 5/15; in progress  Goal status: IN PROGRESS    LONG TERM GOALS: Target date: 12/14/2022      Patient will increase FOTO score to equal to or greater than  55   to demonstrate statistically significant improvement in mobility and quality of life.  Baseline: 45 5/15: 53%  Goal status: Partially Met   2.  Patient will reduce Neck Disability Index score to <10% to demonstrate minimal disability with ADL's including improved sleeping tolerance, sitting tolerance, etc for better mobility at home and work. Baseline: 42% 5/15: 24%  Goal status: Partially Met  3.  Patient will improve cervical ROM by 20 degrees flexion, sidebending, and rotation for improved quality of life and ability to drive.  Baseline: 3/27: see above 5/15: see above Goal status: Partially  Met  4.  Patient will return to full work and recreational activities without compensation or pain increase of >3/10.  Baseline: 3/27: unable to perform 5/15: returned to work, beginning recreation of sewing but needs to take breaks.  Goal status: Partially Met    PLAN:  PT FREQUENCY: 2x/week  PT DURATION: 8 weeks  PLANNED INTERVENTIONS: Therapeutic exercises, Therapeutic activity, Neuromuscular re-education, Balance training, Gait training, Patient/Family education, Self Care, Joint mobilization, Stair training, Vestibular training, Canalith repositioning, Dry Needling, Spinal mobilization, Cryotherapy, Moist heat, scar mobilization, Traction, Ultrasound, Manual therapy, and Re-evaluation  PLAN FOR NEXT SESSION: upper trap release, cervical ROM increase, postural strengthening.   Precious Bard PT  Physical Therapist- [place]  [facility]  11/29/2022, 8:03 AM

## 2022-11-29 ENCOUNTER — Ambulatory Visit: Payer: Commercial Managed Care - PPO | Attending: Orthopedic Surgery

## 2022-11-29 DIAGNOSIS — R293 Abnormal posture: Secondary | ICD-10-CM | POA: Diagnosis not present

## 2022-11-29 DIAGNOSIS — R2689 Other abnormalities of gait and mobility: Secondary | ICD-10-CM | POA: Insufficient documentation

## 2022-11-29 DIAGNOSIS — M6281 Muscle weakness (generalized): Secondary | ICD-10-CM | POA: Diagnosis not present

## 2022-11-29 DIAGNOSIS — M542 Cervicalgia: Secondary | ICD-10-CM | POA: Insufficient documentation

## 2022-11-30 DIAGNOSIS — M4802 Spinal stenosis, cervical region: Secondary | ICD-10-CM | POA: Diagnosis not present

## 2022-11-30 DIAGNOSIS — M5412 Radiculopathy, cervical region: Secondary | ICD-10-CM | POA: Diagnosis not present

## 2022-12-05 NOTE — Therapy (Incomplete)
OUTPATIENT PHYSICAL THERAPY CERVICAL TREATMENT   Patient Name: Lori Morales MRN: [id number] DOB:[birth year], 62 y.o., female Today's Date: 12/06/2022  END OF SESSION:  PT End of Session - 12/06/22 0716     Visit Number 17    Number of Visits 27    Date for PT Re-Evaluation 12/14/22    Authorization Type [employer] employee [employer]    Authorization Time Period 08/24/22-10/19/22    Progress Note Due on Visit 10    PT Start Time 0714    PT Stop Time 0756    PT Time Calculation (min) 42 min    Activity Tolerance Patient tolerated treatment well;Patient limited by pain    Behavior During Therapy [facility] for tasks assessed/performed                         Past Medical History:  Diagnosis Date   Arthritis    CAD (coronary artery disease), native coronary artery    mild non calcified plaque in mid LAD and prox LCx on coronary CTA 01/2019   Depression    GERD (gastroesophageal reflux disease)    Headache    migraines   Hx of squamous cell carcinoma 07/11/2016   Right distal dorsum lat forearm    Hypertension    IBS (irritable bowel syndrome)    Palpitations    PFO (patent foramen ovale)    small by cardiac CTA 01/2019   PONV (postoperative nausea and vomiting)    Restless legs syndrome    Squamous cell carcinoma of skin 12/202/2017   right distal dorsum lateral forearm   Past Surgical History:  Procedure Laterality Date   ABDOMINOPLASTY     BACK SURGERY     1 laminectomy, 1 fusion   COLONOSCOPY     COLONOSCOPY WITH PROPOFOL N/A 01/17/2017   Procedure: COLONOSCOPY WITH PROPOFOL;  Surgeon: Christena Deem, MD;  Location: [facility] ENDOSCOPY;  Service: Endoscopy;  Laterality: N/A;   COLONOSCOPY WITH PROPOFOL N/A 10/07/2022   Procedure: COLONOSCOPY WITH PROPOFOL;  Surgeon: [name], MD;  Location: [facility];  Service: Endoscopy;  Laterality: N/A;   WISDOM TOOTH EXTRACTION     Patient Active Problem List   Diagnosis Date Noted   Personal history of colonic polyps  10/07/2022   PFO (patent foramen ovale)    CAD (coronary artery disease), native coronary artery    Acute CVA (cerebrovascular accident) (HCC) 06/15/2018   Depression 10/16/2017   Migraine 10/16/2017   Hyperglycemia, unspecified 07/10/2017   Hyperglycemia 07/10/2017   H/O dizziness 08/02/2016   Cervical disc disorder with radiculopathy of cervical region 04/12/2016   Nonallopathic lesion of thoracic region 04/12/2016   Nonallopathic lesion of rib cage 04/12/2016   Facet arthritis of cervical region 03/21/2016   Hyperlipidemia 01/26/2015   Labral tear of shoulder, right, subsequent encounter 12/04/2014   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   Impingement syndrome of right shoulder 05/28/2014   Right supraspinatus tenosynovitis 05/28/2014   Arthritis pain 04/03/2014   Fatigue 04/03/2014   Cervical intraepithelial neoplasia grade 1 06/20/2012   IBS (irritable bowel syndrome) 06/20/2012   Lumbar disc disease 06/20/2012    PCP: Lynnea Ferrier MD  REFERRING PROVIDER: Brown,Christopher R MD  REFERRING DIAG: cervical radiculopathy  THERAPY DIAG:  Cervicalgia  Abnormal posture  Other abnormalities of gait and mobility  Rationale for Evaluation and Treatment: Rehabilitation  ONSET DATE: 06/14/22  SUBJECTIVE:  SUBJECTIVE STATEMENT: Patient presents from a good weekend, was able to do some quilting.   Hand dominance: Right  PERTINENT HISTORY:  Patient is returning to PT for s/p cervical fusion C5-7 with bilateral foraminotomies at C6-7 on 06/14/22. Patient has a history of C6-7 arthroplasty and has been treated by this clinician before. PMH includes anxiety, CAD, depression, arthritis of cervical region, gastritis, GERD, cancer, stroke, HLD, HTN, IB, lumbar disc disease (fusion x2 ), migraine,  numbness of L hand, headaches, and restless leg syndrome.  Patient had posterior cervical fusion from C5-C7 and bilateral foraminotomies at C6-7 on 06/14/22; Only restriction is no lifting >15 lb  PAIN:  Are you having pain? Yes: NPRS scale: 3/10 Pain location: lower cervical and across traps Pain description: aching Aggravating factors: positioning Relieving factors: positioning, heat  PRECAUTIONS: Cervical  WEIGHT BEARING RESTRICTIONS:  no lifting>15 lb   FALLS:  Has patient fallen in last 6 months? No  LIVING ENVIRONMENT: Lives with: lives with their spouse Lives in: House/apartment Stairs: Yes: External:   steps;     OCCUPATION: Chiropodist   PLOF: Independent  PATIENT GOALS: to have more range of motion  NEXT MD VISIT: July 3rd, 2024  OBJECTIVE:   DIAGNOSTIC FINDINGS:  IMPRESSION: 1. Anterior cervical fusion at C6-7. Susceptibility artifact partially obscures evaluation of the neural foramina and central canal. No significant right foraminal stenosis. Left uncovertebral degenerative changes with moderate left foraminal stenosis. 2. At C2-3 there is a minimal broad-based disc bulge. Moderate right facet arthropathy. Mild right foraminal stenosis. 3. At C3-4 there is a minimal broad-based disc bulge. Moderate right facet arthropathy. Mild left foraminal stenosis. 4. At C4-5 there is a minimal broad-based disc bulge. Mild right facet arthropathy. Moderate left foraminal stenosis. 5. At C7-T1 there is a minimal broad-based disc bulge. Mild right and moderate left foraminal stenosis.    IMPRESSION: 1. Anterior cervical fusion at C6-7. Susceptibility artifact partially obscures evaluation of the neural foramina and central canal. No significant right foraminal stenosis. Left uncovertebral degenerative changes with moderate left foraminal stenosis. 2. At C2-3 there is a minimal broad-based disc bulge. Moderate right facet arthropathy. Mild right foraminal  stenosis. 3. At C3-4 there is a minimal broad-based disc bulge. Moderate right facet arthropathy. Mild left foraminal stenosis. 4. At C4-5 there is a minimal broad-based disc bulge. Mild right facet arthropathy. Moderate left foraminal stenosis. 5. At C7-T1 there is a minimal broad-based disc bulge. Mild right and moderate left foraminal stenosis.  PATIENT SURVEYS:  NDI 42% FOTO 45  COGNITION: Overall cognitive status: Within functional limits for tasks assessed  SENSATION: Light touch: WFL; occasional R hand numbness with sleep   POSTURE: rounded shoulders  PALPATION: Hardware present, tenderness at C5-6, C6-7.    CERVICAL ROM:   Active ROM A/PROM (deg) eval PN 5/15:  Flexion 24 39  Extension 25 30  Right lateral flexion 9 20  Left lateral flexion 19 21  Right rotation 18 30  Left rotation 19 31   (Blank rows = not tested)  UPPER EXTREMITY ROM:  Active ROM Right eval Left eval  Shoulder flexion [facility]  Shoulder extension    Shoulder abduction 170 140   (Blank rows = not tested)  UPPER EXTREMITY MMT:  MMT Right eval Left eval  Shoulder flexion 4+ 4+  Shoulder extension 4+ 4+  Shoulder abduction 4+ 4+  Shoulder adduction 4+ 4+  Shoulder extension 4+ 4+  Elbow flexion 4+ 4+  Elbow extension 4+ 4+   (Blank  rows = not tested)    TODAY'S TREATMENT:                                                                                                                              DATE: 12/06/22   Manual Therapy:  Sub-Occiput release 5x30 seconds  Cervical PA mobs supine x 2 minutes Grade II thoracic mobilization x 3 minutes   There.Ex: Prone over green ball: -I', Y', W's 10x each   Supine: GTB Y 20x  UBE backward row 3 minutes LVL 4 cues for upright posture   Education on upper trap stretching for reduction of tightening throughout session.    Trigger Point Dry Needling (TDN), unbilled Education performed with patient regarding potential benefit  of TDN. Reviewed precautions and risks with patient. Reviewed special precautions/risks over lung fields which include pneumothorax. Reviewed signs and symptoms of pneumothorax and advised pt to go to ER immediately if these symptoms develop advise them of dry needling treatment. Extensive time spent with pt to ensure full understanding of TDN risks. Pt provided verbal consent to treatment. TDN performed to  with 0.25 x 40 single needle placements with local twitch response (LTR). Pistoning technique utilized. Improved pain-free motion following intervention. Prone position: L  upper trap x 2 minutes   PATIENT EDUCATION:  Education details: goals, POC, HEP Person educated: Patient Education method: Explanation, Demonstration, Tactile cues, and Verbal cues Education comprehension: verbalized understanding, returned demonstration, verbal cues required, and tactile cues required  HOME EXERCISE PROGRAM: Access Code: [email or web address] URL: [email or web address] Date: 08/24/2022 Prepared by: Precious Bard  Exercises - Supine Cervical Retraction with Towel  - 1 x daily - 7 x weekly - 2 sets - 10 reps - 5 hold - Seated Scapular Retraction  - 1 x daily - 7 x weekly - 2 sets - 10 reps - 5 hold - Supine Cervical Rotation AROM on Pillow  - 1 x daily - 7 x weekly - 2 sets - 10 reps - 5 hold - Neck Sidebending  - 1 x daily - 7 x weekly - 2 sets - 10 reps - 5 hold - Supine Cervical Flexion Extension on Pillow  - 1 x daily - 7 x weekly - 2 sets - 10 reps - 5 hold  ASSESSMENT:  CLINICAL IMPRESSION: Patient has a large trigger point released with upper trap TDN. She demonstrates carryover from previous session with decreased cueing required for body mechanics of posterior chain strengthening exercises. She is more challenged with W exercise this session due to fatigue however I's and Y's were improved. Pt will benefit from skilled physical therapy to reduce pain,improve ROM, and return to  PLOF.  OBJECTIVE IMPAIRMENTS: decreased activity tolerance, decreased endurance, decreased ROM, decreased strength, hypomobility, increased muscle spasms, impaired flexibility, improper body mechanics, postural dysfunction, and pain.   ACTIVITY LIMITATIONS: carrying, lifting, sitting, standing, reach over head, and work  PARTICIPATION LIMITATIONS: meal prep, cleaning, laundry, driving, shopping, community activity, occupation, and  yard work  PERSONAL FACTORS: Past/current experiences, Profession, Sex, Time since onset of injury/illness/exacerbation, and 3+ comorbidities: anxiety, CAD, depression, arthritis of cervical region, gastritis, GERD, cancer, stroke, HLD, HTN, IB, lumbar disc disease (fusion x2 ), migraine, numbness of L hand, headaches, and restless leg syndrome  are also affecting patient's functional outcome.   REHAB POTENTIAL: Fair    CLINICAL DECISION MAKING: Evolving/moderate complexity  EVALUATION COMPLEXITY: Moderate   GOALS: Goals reviewed with patient? Yes  SHORT TERM GOALS: Target date: 11/16/2022      Patient will be independent in home exercise program to improve strength/mobility for better functional independence with ADLs. Baseline: 3/27: given 5/15; in progress  Goal status: IN PROGRESS    LONG TERM GOALS: Target date: 12/14/2022      Patient will increase FOTO score to equal to or greater than  55   to demonstrate statistically significant improvement in mobility and quality of life.  Baseline: 45 5/15: 53%  Goal status: Partially Met   2.  Patient will reduce Neck Disability Index score to <10% to demonstrate minimal disability with ADL's including improved sleeping tolerance, sitting tolerance, etc for better mobility at home and work. Baseline: 42% 5/15: 24%  Goal status: Partially Met  3.  Patient will improve cervical ROM by 20 degrees flexion, sidebending, and rotation for improved quality of life and ability to drive.  Baseline: 3/27: see  above 5/15: see above Goal status: Partially Met  4.  Patient will return to full work and recreational activities without compensation or pain increase of >3/10.  Baseline: 3/27: unable to perform 5/15: returned to work, beginning recreation of sewing but needs to take breaks.  Goal status: Partially Met    PLAN:  PT FREQUENCY: 2x/week  PT DURATION: 8 weeks  PLANNED INTERVENTIONS: Therapeutic exercises, Therapeutic activity, Neuromuscular re-education, Balance training, Gait training, Patient/Family education, Self Care, Joint mobilization, Stair training, Vestibular training, Canalith repositioning, Dry Needling, Spinal mobilization, Cryotherapy, Moist heat, scar mobilization, Traction, Ultrasound, Manual therapy, and Re-evaluation  PLAN FOR NEXT SESSION: upper trap release,  postural strengthening.   Precious Bard PT  Physical Therapist- [place]  [facility]  12/06/2022, 8:02 AM

## 2022-12-06 ENCOUNTER — Ambulatory Visit: Payer: Commercial Managed Care - PPO

## 2022-12-06 DIAGNOSIS — R2689 Other abnormalities of gait and mobility: Secondary | ICD-10-CM

## 2022-12-06 DIAGNOSIS — R293 Abnormal posture: Secondary | ICD-10-CM

## 2022-12-06 DIAGNOSIS — M6281 Muscle weakness (generalized): Secondary | ICD-10-CM | POA: Diagnosis not present

## 2022-12-06 DIAGNOSIS — M542 Cervicalgia: Secondary | ICD-10-CM

## 2022-12-12 ENCOUNTER — Ambulatory Visit: Payer: Commercial Managed Care - PPO

## 2022-12-12 DIAGNOSIS — M6281 Muscle weakness (generalized): Secondary | ICD-10-CM

## 2022-12-12 DIAGNOSIS — M542 Cervicalgia: Secondary | ICD-10-CM | POA: Diagnosis not present

## 2022-12-12 DIAGNOSIS — R2689 Other abnormalities of gait and mobility: Secondary | ICD-10-CM | POA: Diagnosis not present

## 2022-12-12 DIAGNOSIS — R293 Abnormal posture: Secondary | ICD-10-CM | POA: Diagnosis not present

## 2022-12-12 NOTE — Therapy (Signed)
OUTPATIENT PHYSICAL THERAPY CERVICAL TREATMENT/RECERT   Patient Name: Lori Morales MRN: [id number] DOB:1960/09/27, 62 y.o., female Today's Date: 12/12/2022  END OF SESSION:  PT End of Session - 12/12/22 0714     Visit Number 18    Number of Visits 34    Date for PT Re-Evaluation 02/06/23    Authorization Type [employer] employee [employer]    Authorization Time Period 08/24/22-10/19/22    Progress Note Due on Visit 10    PT Start Time 0714    PT Stop Time 0755    PT Time Calculation (min) 41 min    Activity Tolerance Patient tolerated treatment well;Patient limited by pain    Behavior During Therapy [facility] for tasks assessed/performed                          Past Medical History:  Diagnosis Date   Arthritis    CAD (coronary artery disease), native coronary artery    mild non calcified plaque in mid LAD and prox LCx on coronary CTA 01/2019   Depression    GERD (gastroesophageal reflux disease)    Headache    migraines   Hx of squamous cell carcinoma 07/11/2016   Right distal dorsum lat forearm    Hypertension    IBS (irritable bowel syndrome)    Palpitations    PFO (patent foramen ovale)    small by cardiac CTA 01/2019   PONV (postoperative nausea and vomiting)    Restless legs syndrome    Squamous cell carcinoma of skin 12/202/2017   right distal dorsum lateral forearm   Past Surgical History:  Procedure Laterality Date   ABDOMINOPLASTY     BACK SURGERY     1 laminectomy, 1 fusion   COLONOSCOPY     COLONOSCOPY WITH PROPOFOL N/A 01/17/2017   Procedure: COLONOSCOPY WITH PROPOFOL;  Surgeon: Christena Deem, MD;  Location: [facility] ENDOSCOPY;  Service: Endoscopy;  Laterality: N/A;   COLONOSCOPY WITH PROPOFOL N/A 10/07/2022   Procedure: COLONOSCOPY WITH PROPOFOL;  Surgeon: [name], MD;  Location: [facility];  Service: Endoscopy;  Laterality: N/A;   WISDOM TOOTH EXTRACTION     Patient Active Problem List   Diagnosis Date Noted   Personal history of  colonic polyps 10/07/2022   PFO (patent foramen ovale)    CAD (coronary artery disease), native coronary artery    Acute CVA (cerebrovascular accident) (HCC) 06/15/2018   Depression 10/16/2017   Migraine 10/16/2017   Hyperglycemia, unspecified 07/10/2017   Hyperglycemia 07/10/2017   H/O dizziness 08/02/2016   Cervical disc disorder with radiculopathy of cervical region 04/12/2016   Nonallopathic lesion of thoracic region 04/12/2016   Nonallopathic lesion of rib cage 04/12/2016   Facet arthritis of cervical region 03/21/2016   Hyperlipidemia 01/26/2015   Labral tear of shoulder, right, subsequent encounter 12/04/2014   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   Impingement syndrome of right shoulder 05/28/2014   Right supraspinatus tenosynovitis 05/28/2014   Arthritis pain 04/03/2014   Fatigue 04/03/2014   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   Hand dominance: Right  PERTINENT HISTORY:  Patient is returning to PT for s/p cervical fusion C5-7 with bilateral foraminotomies at C6-7 on 06/14/22. Patient has a history of C6-7 arthroplasty and has been treated by this clinician before. PMH includes anxiety, CAD, depression, arthritis of cervical region, gastritis, GERD, cancer, stroke, HLD, HTN, IB, lumbar disc  disease (fusion x2 ), migraine, numbness of L hand, headaches, and restless leg syndrome.  Patient had posterior cervical fusion from C5-C7 and bilateral foraminotomies at C6-7 on 06/14/22; Only restriction is no lifting >15 lb  PAIN:  Are you having pain? Yes: NPRS scale: 3/10 Pain location: lower cervical and across traps Pain description: aching Aggravating factors: positioning Relieving factors: positioning, heat  PRECAUTIONS: Cervical  WEIGHT BEARING RESTRICTIONS:  no lifting>15 lb   FALLS:  Has patient fallen in last 6 months? No  LIVING ENVIRONMENT: Lives with: lives with their spouse Lives in: House/apartment Stairs: Yes: External:   steps;     OCCUPATION: Chiropodist   PLOF: Independent  PATIENT GOALS: to have more range of motion  NEXT MD VISIT: July 3rd, 2024  OBJECTIVE:   DIAGNOSTIC FINDINGS:  IMPRESSION: 1. Anterior cervical fusion at C6-7. Susceptibility artifact partially obscures evaluation of the neural foramina and central canal. No significant right foraminal stenosis. Left uncovertebral degenerative changes with moderate left foraminal stenosis. 2. At C2-3 there is a minimal broad-based disc bulge. Moderate right facet arthropathy. Mild right foraminal stenosis. 3. At C3-4 there is a minimal broad-based disc bulge. Moderate right facet arthropathy. Mild left foraminal stenosis. 4. At C4-5 there is a minimal broad-based disc bulge. Mild right facet arthropathy. Moderate left foraminal stenosis. 5. At C7-T1 there is a minimal broad-based disc bulge. Mild right and moderate left foraminal stenosis.    IMPRESSION: 1. Anterior cervical fusion at C6-7. Susceptibility artifact partially obscures evaluation of the neural foramina and central canal. No significant right foraminal stenosis. Left uncovertebral degenerative changes with moderate left foraminal stenosis. 2. At C2-3 there is a minimal broad-based disc bulge. Moderate right facet  arthropathy. Mild right foraminal stenosis. 3. At C3-4 there is a minimal broad-based disc bulge. Moderate right facet arthropathy. Mild left foraminal stenosis. 4. At C4-5 there is a minimal broad-based disc bulge. Mild right facet arthropathy. Moderate left foraminal stenosis. 5. At C7-T1 there is a minimal broad-based disc bulge. Mild right and moderate left foraminal stenosis.  PATIENT SURVEYS:  NDI 42% FOTO 45  COGNITION: Overall cognitive status: Within functional limits for tasks assessed  SENSATION: Light touch: WFL; occasional R hand numbness with sleep   POSTURE: rounded shoulders  PALPATION: Hardware present, tenderness at C5-6, C6-7.    CERVICAL ROM:   Active ROM A/PROM (deg) eval PN 5/15: RECERT 7/15  Flexion 24 39 40  Extension 25 30 31   Right lateral flexion 9 20 19   Left lateral flexion 19 21 21   Right rotation 18 30 35  Left rotation 19 31 39   (Blank rows = not tested)  UPPER EXTREMITY ROM:  Active ROM Right eval Left eval  Shoulder flexion [facility]  Shoulder extension    Shoulder abduction 170 140   (Blank rows = not tested)  UPPER EXTREMITY MMT:  MMT Right eval Left eval  Shoulder flexion 4+ 4+  Shoulder extension 4+ 4+  Shoulder abduction 4+ 4+  Shoulder adduction 4+ 4+  Shoulder extension 4+ 4+  Elbow flexion 4+ 4+  Elbow extension  4+ 4+   (Blank rows = not tested)    TODAY'S TREATMENT:                                                                                                                              DATE: 12/12/22  Goals performed: see below:   Manual Therapy:  Sub-Occiput release 5x30 seconds  Cervical PA mobs supine x 2 minutes Grade II thoracic mobilization x 3 minutes   There.Ex: Prone over green ball: -I', Y', W's 10x each  -Standing GTB lat pulldown 15x  Education on upper trap stretching for reduction of tightening throughout session.    Trigger Point Dry Needling (TDN), unbilled Education performed  with patient regarding potential benefit of TDN. Reviewed precautions and risks with patient. Reviewed special precautions/risks over lung fields which include pneumothorax. Reviewed signs and symptoms of pneumothorax and advised pt to go to ER immediately if these symptoms develop advise them of dry needling treatment. Extensive time spent with pt to ensure full understanding of TDN risks. Pt provided verbal consent to treatment. TDN performed to  with 0.25 x 40 single needle placements with local twitch response (LTR). Pistoning technique utilized. Improved pain-free motion following intervention. Prone position: L and R upper trap x 3 minutes   PATIENT EDUCATION:  Education details: goals, POC, HEP Person educated: Patient Education method: Explanation, Demonstration, Tactile cues, and Verbal cues Education comprehension: verbalized understanding, returned demonstration, verbal cues required, and tactile cues required  HOME EXERCISE PROGRAM: Access Code: [email or web address] URL: [email or web address] Date: 08/24/2022 Prepared by: Precious Bard  Exercises - Supine Cervical Retraction with Towel  - 1 x daily - 7 x weekly - 2 sets - 10 reps - 5 hold - Seated Scapular Retraction  - 1 x daily - 7 x weekly - 2 sets - 10 reps - 5 hold - Supine Cervical Rotation AROM on Pillow  - 1 x daily - 7 x weekly - 2 sets - 10 reps - 5 hold - Neck Sidebending  - 1 x daily - 7 x weekly - 2 sets - 10 reps - 5 hold - Supine Cervical Flexion Extension on Pillow  - 1 x daily - 7 x weekly - 2 sets - 10 reps - 5 hold  ASSESSMENT:  CLINICAL IMPRESSION: Patient is progressing with functional ROM and decreased pain with functional activity. She is able to perform work and activities of daily living with decreased frequency of pain increase. She does continue to have stiffness with prolonged performance of activities and is not yet quite to a functional baseline. Patient will benefit from an additional round of POC.  Pt will benefit from skilled physical therapy to reduce pain,improve ROM, and return to PLOF.  OBJECTIVE IMPAIRMENTS: decreased activity tolerance, decreased endurance, decreased ROM, decreased strength, hypomobility, increased muscle spasms, impaired flexibility, improper body mechanics, postural dysfunction, and pain.   ACTIVITY LIMITATIONS: carrying, lifting, sitting, standing, reach over head, and work  PARTICIPATION LIMITATIONS: meal  prep, cleaning, laundry, driving, shopping, community activity, occupation, and yard work  PERSONAL FACTORS: Past/current experiences, Profession, Sex, Time since onset of injury/illness/exacerbation, and 3+ comorbidities: anxiety, CAD, depression, arthritis of cervical region, gastritis, GERD, cancer, stroke, HLD, HTN, IB, lumbar disc disease (fusion x2 ), migraine, numbness of L hand, headaches, and restless leg syndrome  are also affecting patient's functional outcome.   REHAB POTENTIAL: Fair    CLINICAL DECISION MAKING: Evolving/moderate complexity  EVALUATION COMPLEXITY: Moderate   GOALS: Goals reviewed with patient? Yes  SHORT TERM GOALS: Target date: 11/16/2022      Patient will be independent in home exercise program to improve strength/mobility for better functional independence with ADLs. Baseline: 3/27: given 5/15; in progress  7/15: compliant  Goal status: MET    LONG TERM GOALS: Target date: 02/06/2023    Patient will increase FOTO score to equal to or greater than  55   to demonstrate statistically significant improvement in mobility and quality of life.  Baseline: 45 5/15: 53% 7/15: 50% Goal status: Partially Met   2.  Patient will reduce Neck Disability Index score to <10% to demonstrate minimal disability with ADL's including improved sleeping tolerance, sitting tolerance, etc for better mobility at home and work. Baseline: 42% 5/15: 24% 7/15: 20%  Goal status: Partially Met  3.  Patient will improve cervical ROM by 20  degrees flexion, sidebending, and rotation for improved quality of life and ability to drive.  Baseline: 3/27: see above 5/15: see above 7/15: see above Goal status: Partially Met  4.  Patient will return to full work and recreational activities without compensation or pain increase of >3/10.  Baseline: 3/27: unable to perform 5/15: returned to work, beginning recreation of sewing but needs to take breaks. 7/15: 3/10 pain with quilting and bird watching  Goal status: Partially Met    PLAN:  PT FREQUENCY: 2x/week  PT DURATION: 8 weeks  PLANNED INTERVENTIONS: Therapeutic exercises, Therapeutic activity, Neuromuscular re-education, Balance training, Gait training, Patient/Family education, Self Care, Joint mobilization, Stair training, Vestibular training, Canalith repositioning, Dry Needling, Spinal mobilization, Cryotherapy, Moist heat, scar mobilization, Traction, Ultrasound, Manual therapy, and Re-evaluation  PLAN FOR NEXT SESSION: upper trap release,  postural strengthening.   Precious Bard PT  Physical Therapist- [place]  [facility]  12/12/2022, 8:16 AM

## 2022-12-14 ENCOUNTER — Ambulatory Visit: Payer: Commercial Managed Care - PPO

## 2022-12-15 ENCOUNTER — Other Ambulatory Visit: Payer: Self-pay

## 2022-12-15 MED ORDER — ZOLPIDEM TARTRATE ER 6.25 MG PO TBCR
6.2500 mg | EXTENDED_RELEASE_TABLET | Freq: Every evening | ORAL | 5 refills | Status: DC | PRN
  Filled 2022-12-15: qty 30, 30d supply, fill #0
  Filled 2023-01-30 – 2023-02-22 (×2): qty 30, 30d supply, fill #1

## 2022-12-15 NOTE — Therapy (Signed)
OUTPATIENT PHYSICAL THERAPY CERVICAL TREATMENT   Patient Name: Lori Morales MRN: [id number] DOB:11-24-1960, 62 y.o., female Today's Date: 12/19/2022  END OF SESSION:  PT End of Session - 12/19/22 0711     Visit Number 19    Number of Visits 34    Date for PT Re-Evaluation 02/06/23    Authorization Type [employer] employee [employer]    Authorization Time Period 08/24/22-10/19/22    Progress Note Due on Visit 10    PT Start Time 0714    PT Stop Time 0757    PT Time Calculation (min) 43 min    Activity Tolerance Patient tolerated treatment well;Patient limited by pain    Behavior During Therapy [facility] for tasks assessed/performed                           Past Medical History:  Diagnosis Date   Arthritis    CAD (coronary artery disease), native coronary artery    mild non calcified plaque in mid LAD and prox LCx on coronary CTA 01/2019   Depression    GERD (gastroesophageal reflux disease)    Headache    migraines   Hx of squamous cell carcinoma 07/11/2016   Right distal dorsum lat forearm    Hypertension    IBS (irritable bowel syndrome)    Palpitations    PFO (patent foramen ovale)    small by cardiac CTA 01/2019   PONV (postoperative nausea and vomiting)    Restless legs syndrome    Squamous cell carcinoma of skin 12/202/2017   right distal dorsum lateral forearm   Past Surgical History:  Procedure Laterality Date   ABDOMINOPLASTY     BACK SURGERY     1 laminectomy, 1 fusion   COLONOSCOPY     COLONOSCOPY WITH PROPOFOL N/A 01/17/2017   Procedure: COLONOSCOPY WITH PROPOFOL;  Surgeon: Christena Deem, MD;  Location: [facility];  Service: Endoscopy;  Laterality: N/A;   COLONOSCOPY WITH PROPOFOL N/A 10/07/2022   Procedure: COLONOSCOPY WITH PROPOFOL;  Surgeon: [name], MD;  Location: [facility];  Service: Endoscopy;  Laterality: N/A;   WISDOM TOOTH EXTRACTION     Patient Active Problem List   Diagnosis Date Noted   Personal history of colonic  polyps 10/07/2022   PFO (patent foramen ovale)    CAD (coronary artery disease), native coronary artery    Acute CVA (cerebrovascular accident) (HCC) 06/15/2018   Depression 10/16/2017   Migraine 10/16/2017   Hyperglycemia, unspecified 07/10/2017   Hyperglycemia 07/10/2017   H/O dizziness 08/02/2016   Cervical disc disorder with radiculopathy of cervical region 04/12/2016   Nonallopathic lesion of thoracic region 04/12/2016   Nonallopathic lesion of rib cage 04/12/2016   Facet arthritis of cervical region 03/21/2016   Hyperlipidemia 01/26/2015   Labral tear of shoulder, right, subsequent encounter 12/04/2014   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   Impingement syndrome of right shoulder 05/28/2014   Right supraspinatus tenosynovitis 05/28/2014   Arthritis pain 04/03/2014   Fatigue 04/03/2014   Cervical intraepithelial neoplasia grade 1 06/20/2012   IBS (irritable bowel syndrome) 06/20/2012   Lumbar disc disease 06/20/2012    PCP: Lynnea Ferrier MD  REFERRING PROVIDER: Brown,Christopher R MD  REFERRING DIAG: cervical radiculopathy  THERAPY DIAG:  Cervicalgia  Abnormal posture  Other abnormalities of gait and mobility  Muscle weakness (generalized)  Rationale for Evaluation and Treatment: Rehabilitation  ONSET DATE: 06/14/22  SUBJECTIVE:  SUBJECTIVE STATEMENT: Patient has new scapular pain on L scapula musculature.   Hand dominance: Right  PERTINENT HISTORY:  Patient is returning to PT for s/p cervical fusion C5-7 with bilateral foraminotomies at C6-7 on 06/14/22. Patient has a history of C6-7 arthroplasty and has been treated by this clinician before. PMH includes anxiety, CAD, depression, arthritis of cervical region, gastritis, GERD, cancer, stroke, HLD, HTN, IB, lumbar disc disease  (fusion x2 ), migraine, numbness of L hand, headaches, and restless leg syndrome.  Patient had posterior cervical fusion from C5-C7 and bilateral foraminotomies at C6-7 on 06/14/22; Only restriction is no lifting >15 lb  PAIN:  Are you having pain? Yes: NPRS scale: 3/10 Pain location: lower cervical and across traps Pain description: aching Aggravating factors: positioning Relieving factors: positioning, heat  PRECAUTIONS: Cervical  WEIGHT BEARING RESTRICTIONS:  no lifting>15 lb   FALLS:  Has patient fallen in last 6 months? No  LIVING ENVIRONMENT: Lives with: lives with their spouse Lives in: House/apartment Stairs: Yes: External:   steps;     OCCUPATION: Chiropodist   PLOF: Independent  PATIENT GOALS: to have more range of motion  NEXT MD VISIT: July 3rd, 2024  OBJECTIVE:   DIAGNOSTIC FINDINGS:  IMPRESSION: 1. Anterior cervical fusion at C6-7. Susceptibility artifact partially obscures evaluation of the neural foramina and central canal. No significant right foraminal stenosis. Left uncovertebral degenerative changes with moderate left foraminal stenosis. 2. At C2-3 there is a minimal broad-based disc bulge. Moderate right facet arthropathy. Mild right foraminal stenosis. 3. At C3-4 there is a minimal broad-based disc bulge. Moderate right facet arthropathy. Mild left foraminal stenosis. 4. At C4-5 there is a minimal broad-based disc bulge. Mild right facet arthropathy. Moderate left foraminal stenosis. 5. At C7-T1 there is a minimal broad-based disc bulge. Mild right and moderate left foraminal stenosis.    IMPRESSION: 1. Anterior cervical fusion at C6-7. Susceptibility artifact partially obscures evaluation of the neural foramina and central canal. No significant right foraminal stenosis. Left uncovertebral degenerative changes with moderate left foraminal stenosis. 2. At C2-3 there is a minimal broad-based disc bulge. Moderate right facet arthropathy.  Mild right foraminal stenosis. 3. At C3-4 there is a minimal broad-based disc bulge. Moderate right facet arthropathy. Mild left foraminal stenosis. 4. At C4-5 there is a minimal broad-based disc bulge. Mild right facet arthropathy. Moderate left foraminal stenosis. 5. At C7-T1 there is a minimal broad-based disc bulge. Mild right and moderate left foraminal stenosis.  PATIENT SURVEYS:  NDI 42% FOTO 45  COGNITION: Overall cognitive status: Within functional limits for tasks assessed  SENSATION: Light touch: WFL; occasional R hand numbness with sleep   POSTURE: rounded shoulders  PALPATION: Hardware present, tenderness at C5-6, C6-7.    CERVICAL ROM:   Active ROM A/PROM (deg) eval PN 5/15: RECERT 7/15  Flexion 24 39 40  Extension 25 30 31   Right lateral flexion 9 20 19   Left lateral flexion 19 21 21   Right rotation 18 30 35  Left rotation 19 31 39   (Blank rows = not tested)  UPPER EXTREMITY ROM:  Active ROM Right eval Left eval  Shoulder flexion [facility]  Shoulder extension    Shoulder abduction 170 140   (Blank rows = not tested)  UPPER EXTREMITY MMT:  MMT Right eval Left eval  Shoulder flexion 4+ 4+  Shoulder extension 4+ 4+  Shoulder abduction 4+ 4+  Shoulder adduction 4+ 4+  Shoulder extension 4+ 4+  Elbow flexion 4+ 4+  Elbow extension  4+ 4+   (Blank rows = not tested)    TODAY'S TREATMENT:                                                                                                                              DATE: 12/19/22  Goals performed: see below:   Manual Therapy:  Sub-Occiput release 5x30 seconds  Cervical and thoracic grade II mobs x 8 minutes Cervical sidebend with rotation 3x30 seconds STM with implementation of effleurage and pettrisage medial border of L scapula to superior border and L cervical and thoracic paraspinal borer x22 minutes  There.Ex: GTB straight arm lat pulldown supine position with scapular retraction  12x GTB Y overhead 12x  Education on upper trap stretching for reduction of tightening throughout session.     PATIENT EDUCATION:  Education details: goals, POC, HEP Person educated: Patient Education method: Explanation, Demonstration, Tactile cues, and Verbal cues Education comprehension: verbalized understanding, returned demonstration, verbal cues required, and tactile cues required  HOME EXERCISE PROGRAM: Access Code: [email or web address] URL: [email or web address] Date: 08/24/2022 Prepared by: Precious Bard  Exercises - Supine Cervical Retraction with Towel  - 1 x daily - 7 x weekly - 2 sets - 10 reps - 5 hold - Seated Scapular Retraction  - 1 x daily - 7 x weekly - 2 sets - 10 reps - 5 hold - Supine Cervical Rotation AROM on Pillow  - 1 x daily - 7 x weekly - 2 sets - 10 reps - 5 hold - Neck Sidebending  - 1 x daily - 7 x weekly - 2 sets - 10 reps - 5 hold - Supine Cervical Flexion Extension on Pillow  - 1 x daily - 7 x weekly - 2 sets - 10 reps - 5 hold  ASSESSMENT:  CLINICAL IMPRESSION: Patient requires extensive manual treatment this session for scapular border pain. Patient reports complete resolution of symptoms by end of session. Scapular stabilization intervention tolerated well after manual with no pain increase. Patient educated on positioning when at work and during sewing for reduction of aggravation. Pt will benefit from skilled physical therapy to reduce pain,improve ROM, and return to PLOF.  OBJECTIVE IMPAIRMENTS: decreased activity tolerance, decreased endurance, decreased ROM, decreased strength, hypomobility, increased muscle spasms, impaired flexibility, improper body mechanics, postural dysfunction, and pain.   ACTIVITY LIMITATIONS: carrying, lifting, sitting, standing, reach over head, and work  PARTICIPATION LIMITATIONS: meal prep, cleaning, laundry, driving, shopping, community activity, occupation, and yard work  PERSONAL FACTORS: Past/current  experiences, Catering manager, Sex, Time since onset of injury/illness/exacerbation, and 3+ comorbidities: anxiety, CAD, depression, arthritis of cervical region, gastritis, GERD, cancer, stroke, HLD, HTN, IB, lumbar disc disease (fusion x2 ), migraine, numbness of L hand, headaches, and restless leg syndrome  are also affecting patient's functional outcome.   REHAB POTENTIAL: Fair    CLINICAL DECISION MAKING: Evolving/moderate complexity  EVALUATION COMPLEXITY: Moderate   GOALS: Goals reviewed with patient? Yes  SHORT TERM GOALS: Target date: 11/16/2022  Patient will be independent in home exercise program to improve strength/mobility for better functional independence with ADLs. Baseline: 3/27: given 5/15; in progress  7/15: compliant  Goal status: MET    LONG TERM GOALS: Target date: 02/06/2023    Patient will increase FOTO score to equal to or greater than  55   to demonstrate statistically significant improvement in mobility and quality of life.  Baseline: 45 5/15: 53% 7/15: 50% Goal status: Partially Met   2.  Patient will reduce Neck Disability Index score to <10% to demonstrate minimal disability with ADL's including improved sleeping tolerance, sitting tolerance, etc for better mobility at home and work. Baseline: 42% 5/15: 24% 7/15: 20%  Goal status: Partially Met  3.  Patient will improve cervical ROM by 20 degrees flexion, sidebending, and rotation for improved quality of life and ability to drive.  Baseline: 3/27: see above 5/15: see above 7/15: see above Goal status: Partially Met  4.  Patient will return to full work and recreational activities without compensation or pain increase of >3/10.  Baseline: 3/27: unable to perform 5/15: returned to work, beginning recreation of sewing but needs to take breaks. 7/15: 3/10 pain with quilting and bird watching  Goal status: Partially Met    PLAN:  PT FREQUENCY: 2x/week  PT DURATION: 8 weeks  PLANNED  INTERVENTIONS: Therapeutic exercises, Therapeutic activity, Neuromuscular re-education, Balance training, Gait training, Patient/Family education, Self Care, Joint mobilization, Stair training, Vestibular training, Canalith repositioning, Dry Needling, Spinal mobilization, Cryotherapy, Moist heat, scar mobilization, Traction, Ultrasound, Manual therapy, and Re-evaluation  PLAN FOR NEXT SESSION: upper trap release,  postural strengthening.   Precious Bard PT  Physical Therapist- [place]  [facility]  12/19/2022, 7:59 AM

## 2022-12-19 ENCOUNTER — Ambulatory Visit: Payer: Commercial Managed Care - PPO

## 2022-12-19 DIAGNOSIS — M542 Cervicalgia: Secondary | ICD-10-CM | POA: Diagnosis not present

## 2022-12-19 DIAGNOSIS — M6281 Muscle weakness (generalized): Secondary | ICD-10-CM | POA: Diagnosis not present

## 2022-12-19 DIAGNOSIS — R2689 Other abnormalities of gait and mobility: Secondary | ICD-10-CM

## 2022-12-19 DIAGNOSIS — R293 Abnormal posture: Secondary | ICD-10-CM | POA: Diagnosis not present

## 2022-12-20 NOTE — Therapy (Signed)
OUTPATIENT PHYSICAL THERAPY CERVICAL TREATMENT/ Physical Therapy Progress Note   Dates of reporting period  10/12/22   to   12/21/22    Patient Name: Lori Morales MRN: [id number] DOB:09-21-1960, 62 y.o., female Today's Date: 12/21/2022  END OF SESSION:  PT End of Session - 12/21/22 0714     Visit Number 20    Number of Visits 34    Date for PT Re-Evaluation 02/06/23    Authorization Type [employer] employee [employer]    Authorization Time Period 08/24/22-10/19/22    Progress Note Due on Visit 10    PT Start Time 0714    PT Stop Time 0757    PT Time Calculation (min) 43 min    Activity Tolerance Patient tolerated treatment well;Patient limited by pain    Behavior During Therapy [facility] for tasks assessed/performed                            Past Medical History:  Diagnosis Date   Arthritis    CAD (coronary artery disease), native coronary artery    mild non calcified plaque in mid LAD and prox LCx on coronary CTA 01/2019   Depression    GERD (gastroesophageal reflux disease)    Headache    migraines   Hx of squamous cell carcinoma 07/11/2016   Right distal dorsum lat forearm    Hypertension    IBS (irritable bowel syndrome)    Palpitations    PFO (patent foramen ovale)    small by cardiac CTA 01/2019   PONV (postoperative nausea and vomiting)    Restless legs syndrome    Squamous cell carcinoma of skin 12/202/2017   right distal dorsum lateral forearm   Past Surgical History:  Procedure Laterality Date   ABDOMINOPLASTY     BACK SURGERY     1 laminectomy, 1 fusion   COLONOSCOPY     COLONOSCOPY WITH PROPOFOL N/A 01/17/2017   Procedure: COLONOSCOPY WITH PROPOFOL;  Surgeon: Christena Deem, MD;  Location: [facility] ENDOSCOPY;  Service: Endoscopy;  Laterality: N/A;   COLONOSCOPY WITH PROPOFOL N/A 10/07/2022   Procedure: COLONOSCOPY WITH PROPOFOL;  Surgeon: [name], MD;  Location: [facility];  Service: Endoscopy;  Laterality: N/A;   WISDOM TOOTH EXTRACTION      Patient Active Problem List   Diagnosis Date Noted   Personal history of colonic polyps 10/07/2022   PFO (patent foramen ovale)    CAD (coronary artery disease), native coronary artery    Acute CVA (cerebrovascular accident) (HCC) 06/15/2018   Depression 10/16/2017   Migraine 10/16/2017   Hyperglycemia, unspecified 07/10/2017   Hyperglycemia 07/10/2017   H/O dizziness 08/02/2016   Cervical disc disorder with radiculopathy of cervical region 04/12/2016   Nonallopathic lesion of thoracic region 04/12/2016   Nonallopathic lesion of rib cage 04/12/2016   Facet arthritis of cervical region 03/21/2016   Hyperlipidemia 01/26/2015   Labral tear of shoulder, right, subsequent encounter 12/04/2014   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   Impingement syndrome of right shoulder 05/28/2014   Right supraspinatus tenosynovitis 05/28/2014   Arthritis pain 04/03/2014   Fatigue 04/03/2014   Cervical intraepithelial neoplasia grade 1 06/20/2012   IBS (irritable bowel syndrome) 06/20/2012   Lumbar disc disease 06/20/2012    PCP: Lynnea Ferrier MD  REFERRING PROVIDER: Brown,Christopher R MD  REFERRING DIAG: cervical radiculopathy  THERAPY DIAG:  Cervicalgia  Abnormal posture  Other abnormalities of gait and mobility  Muscle weakness (generalized)  Rationale for  Evaluation and Treatment: Rehabilitation  ONSET DATE: 06/14/22  SUBJECTIVE:                                                                                                                                                                                                         SUBJECTIVE STATEMENT: Patient reports her pain has changed, primarily in upper trap and rhomboids, lower scapula pain resolved.   Hand dominance: Right  PERTINENT HISTORY:  Patient is returning to PT for s/p cervical fusion C5-7 with bilateral foraminotomies at C6-7 on 06/14/22. Patient has a history of C6-7 arthroplasty and has been treated by this clinician  before. PMH includes anxiety, CAD, depression, arthritis of cervical region, gastritis, GERD, cancer, stroke, HLD, HTN, IB, lumbar disc disease (fusion x2 ), migraine, numbness of L hand, headaches, and restless leg syndrome.  Patient had posterior cervical fusion from C5-C7 and bilateral foraminotomies at C6-7 on 06/14/22; Only restriction is no lifting >15 lb  PAIN:  Are you having pain? Yes: NPRS scale: 3/10 Pain location: lower cervical and across traps Pain description: aching Aggravating factors: positioning Relieving factors: positioning, heat  PRECAUTIONS: Cervical  WEIGHT BEARING RESTRICTIONS:  no lifting>15 lb   FALLS:  Has patient fallen in last 6 months? No  LIVING ENVIRONMENT: Lives with: lives with their spouse Lives in: House/apartment Stairs: Yes: External:   steps;     OCCUPATION: Chiropodist   PLOF: Independent  PATIENT GOALS: to have more range of motion  NEXT MD VISIT: July 3rd, 2024  OBJECTIVE:   DIAGNOSTIC FINDINGS:  IMPRESSION: 1. Anterior cervical fusion at C6-7. Susceptibility artifact partially obscures evaluation of the neural foramina and central canal. No significant right foraminal stenosis. Left uncovertebral degenerative changes with moderate left foraminal stenosis. 2. At C2-3 there is a minimal broad-based disc bulge. Moderate right facet arthropathy. Mild right foraminal stenosis. 3. At C3-4 there is a minimal broad-based disc bulge. Moderate right facet arthropathy. Mild left foraminal stenosis. 4. At C4-5 there is a minimal broad-based disc bulge. Mild right facet arthropathy. Moderate left foraminal stenosis. 5. At C7-T1 there is a minimal broad-based disc bulge. Mild right and moderate left foraminal stenosis.    IMPRESSION: 1. Anterior cervical fusion at C6-7. Susceptibility artifact partially obscures evaluation of the neural foramina and central canal. No significant right foraminal stenosis. Left  uncovertebral degenerative changes with moderate left foraminal stenosis. 2. At C2-3 there is a minimal broad-based disc bulge. Moderate right facet arthropathy. Mild right foraminal stenosis. 3. At C3-4 there is a minimal broad-based disc bulge. Moderate right facet arthropathy.  Mild left foraminal stenosis. 4. At C4-5 there is a minimal broad-based disc bulge. Mild right facet arthropathy. Moderate left foraminal stenosis. 5. At C7-T1 there is a minimal broad-based disc bulge. Mild right and moderate left foraminal stenosis.  PATIENT SURVEYS:  NDI 42% FOTO 45  COGNITION: Overall cognitive status: Within functional limits for tasks assessed  SENSATION: Light touch: WFL; occasional R hand numbness with sleep   POSTURE: rounded shoulders  PALPATION: Hardware present, tenderness at C5-6, C6-7.    CERVICAL ROM:   Active ROM A/PROM (deg) eval PN 5/15: RECERT 7/15  Flexion 24 39 40  Extension 25 30 31   Right lateral flexion 9 20 19   Left lateral flexion 19 21 21   Right rotation 18 30 35  Left rotation 19 31 39   (Blank rows = not tested)  UPPER EXTREMITY ROM:  Active ROM Right eval Left eval  Shoulder flexion [facility]  Shoulder extension    Shoulder abduction 170 140   (Blank rows = not tested)  UPPER EXTREMITY MMT:  MMT Right eval Left eval  Shoulder flexion 4+ 4+  Shoulder extension 4+ 4+  Shoulder abduction 4+ 4+  Shoulder adduction 4+ 4+  Shoulder extension 4+ 4+  Elbow flexion 4+ 4+  Elbow extension 4+ 4+   (Blank rows = not tested)    TODAY'S TREATMENT:                                                                                                                              DATE: 12/21/22    Manual Therapy:  Sub-Occiput release 5x30 seconds  Cervical and thoracic grade II mobs x 4 minutes Cervical sidebend with rotation 3x30 seconds STM with implementation of effleurage and pettrisage medial border of L scapula to superior border and L  cervical and thoracic paraspinal borer x18 minutes  There.Ex: Green swiss ball:  -I's, Y's, W's 10x each; rest break between -TrA activation 10x -forward trunk rollouts 10x  Education on upper trap stretching for reduction of tightening throughout session.   Trigger Point Dry Needling (TDN), unbilled Education performed with patient regarding potential benefit of TDN. Reviewed precautions and risks with patient. Reviewed special precautions/risks over lung fields which include pneumothorax. Reviewed signs and symptoms of pneumothorax and advised pt to go to ER immediately if these symptoms develop advise them of dry needling treatment. Extensive time spent with pt to ensure full understanding of TDN risks. Pt provided verbal consent to treatment. TDN performed to  with 0.25 x 40 single needle placements with local twitch response (LTR). Pistoning technique utilized. Improved pain-free motion following intervention. L upper trap x 2 minutes      PATIENT EDUCATION:  Education details: goals, POC, HEP Person educated: Patient Education method: Explanation, Demonstration, Tactile cues, and Verbal cues Education comprehension: verbalized understanding, returned demonstration, verbal cues required, and tactile cues required  HOME EXERCISE PROGRAM: Access Code: [email or web address] URL: [email or web address] Date: 08/24/2022 Prepared by: Precious Bard  Exercises - Supine Cervical Retraction with Towel  - 1 x daily - 7 x weekly - 2 sets - 10 reps - 5 hold - Seated Scapular Retraction  - 1 x daily - 7 x weekly - 2 sets - 10 reps - 5 hold - Supine Cervical Rotation AROM on Pillow  - 1 x daily - 7 x weekly - 2 sets - 10 reps - 5 hold - Neck Sidebending  - 1 x daily - 7 x weekly - 2 sets - 10 reps - 5 hold - Supine Cervical Flexion Extension on Pillow  - 1 x daily - 7 x weekly - 2 sets - 10 reps - 5 hold  ASSESSMENT:  CLINICAL IMPRESSION:  Goals performed 12/12/22, please refer to this note  for further details. Patient's condition has the potential to improve in response to therapy. Maximum improvement is yet to be obtained. The anticipated improvement is attainable and reasonable in a generally predictable time. Patient is highly motivated throughout session for pain reduction and muscle activation. Patient to be obtaining a swiss ball for home, educated on exercises to perform. Pt will benefit from skilled physical therapy to reduce pain,improve ROM, and return to PLOF.  OBJECTIVE IMPAIRMENTS: decreased activity tolerance, decreased endurance, decreased ROM, decreased strength, hypomobility, increased muscle spasms, impaired flexibility, improper body mechanics, postural dysfunction, and pain.   ACTIVITY LIMITATIONS: carrying, lifting, sitting, standing, reach over head, and work  PARTICIPATION LIMITATIONS: meal prep, cleaning, laundry, driving, shopping, community activity, occupation, and yard work  PERSONAL FACTORS: Past/current experiences, Catering manager, Sex, Time since onset of injury/illness/exacerbation, and 3+ comorbidities: anxiety, CAD, depression, arthritis of cervical region, gastritis, GERD, cancer, stroke, HLD, HTN, IB, lumbar disc disease (fusion x2 ), migraine, numbness of L hand, headaches, and restless leg syndrome  are also affecting patient's functional outcome.   REHAB POTENTIAL: Fair    CLINICAL DECISION MAKING: Evolving/moderate complexity  EVALUATION COMPLEXITY: Moderate   GOALS: Goals reviewed with patient? Yes  SHORT TERM GOALS: Target date: 11/16/2022      Patient will be independent in home exercise program to improve strength/mobility for better functional independence with ADLs. Baseline: 3/27: given 5/15; in progress  7/15: compliant  Goal status: MET    LONG TERM GOALS: Target date: 02/06/2023    Patient will increase FOTO score to equal to or greater than  55   to demonstrate statistically significant improvement in mobility and quality  of life.  Baseline: 45 5/15: 53% 7/15: 50% Goal status: Partially Met   2.  Patient will reduce Neck Disability Index score to <10% to demonstrate minimal disability with ADL's including improved sleeping tolerance, sitting tolerance, etc for better mobility at home and work. Baseline: 42% 5/15: 24% 7/15: 20%  Goal status: Partially Met  3.  Patient will improve cervical ROM by 20 degrees flexion, sidebending, and rotation for improved quality of life and ability to drive.  Baseline: 3/27: see above 5/15: see above 7/15: see above Goal status: Partially Met  4.  Patient will return to full work and recreational activities without compensation or pain increase of >3/10.  Baseline: 3/27: unable to perform 5/15: returned to work, beginning recreation of sewing but needs to take breaks. 7/15: 3/10 pain with quilting and bird watching  Goal status: Partially Met    PLAN:  PT FREQUENCY: 2x/week  PT DURATION: 8 weeks  PLANNED INTERVENTIONS: Therapeutic exercises, Therapeutic activity, Neuromuscular re-education, Balance training, Gait training, Patient/Family education, Self Care, Joint mobilization, Stair training, Vestibular training, Canalith  repositioning, Dry Needling, Spinal mobilization, Cryotherapy, Moist heat, scar mobilization, Traction, Ultrasound, Manual therapy, and Re-evaluation  PLAN FOR NEXT SESSION: upper trap release,  postural strengthening.   Precious Bard PT  Physical Therapist- [place]  [facility]  12/21/2022, 9:00 AM

## 2022-12-21 ENCOUNTER — Ambulatory Visit: Payer: Commercial Managed Care - PPO

## 2022-12-21 DIAGNOSIS — R293 Abnormal posture: Secondary | ICD-10-CM | POA: Diagnosis not present

## 2022-12-21 DIAGNOSIS — M542 Cervicalgia: Secondary | ICD-10-CM

## 2022-12-21 DIAGNOSIS — R2689 Other abnormalities of gait and mobility: Secondary | ICD-10-CM | POA: Diagnosis not present

## 2022-12-21 DIAGNOSIS — M6281 Muscle weakness (generalized): Secondary | ICD-10-CM

## 2022-12-26 ENCOUNTER — Ambulatory Visit: Payer: Commercial Managed Care - PPO

## 2022-12-27 NOTE — Therapy (Signed)
OUTPATIENT PHYSICAL THERAPY CERVICAL TREATMENT   Patient Name: Lori Morales MRN: [id number] DOB:18-Jan-1961, 62 y.o., female Today's Date: 12/28/2022  END OF SESSION:  PT End of Session - 12/28/22 9629     Visit Number 21    Number of Visits 34    Date for PT Re-Evaluation 02/06/23    Authorization Type [employer] employee [employer]    Authorization Time Period 08/24/22-10/19/22    Progress Note Due on Visit 10    PT Start Time 0713    PT Stop Time 0757    PT Time Calculation (min) 44 min    Activity Tolerance Patient tolerated treatment well;Patient limited by pain    Behavior During Therapy [facility] for tasks assessed/performed                             Past Medical History:  Diagnosis Date   Arthritis    CAD (coronary artery disease), native coronary artery    mild non calcified plaque in mid LAD and prox LCx on coronary CTA 01/2019   Depression    GERD (gastroesophageal reflux disease)    Headache    migraines   Hx of squamous cell carcinoma 07/11/2016   Right distal dorsum lat forearm    Hypertension    IBS (irritable bowel syndrome)    Palpitations    PFO (patent foramen ovale)    small by cardiac CTA 01/2019   PONV (postoperative nausea and vomiting)    Restless legs syndrome    Squamous cell carcinoma of skin 12/202/2017   right distal dorsum lateral forearm   Past Surgical History:  Procedure Laterality Date   ABDOMINOPLASTY     BACK SURGERY     1 laminectomy, 1 fusion   COLONOSCOPY     COLONOSCOPY WITH PROPOFOL N/A 01/17/2017   Procedure: COLONOSCOPY WITH PROPOFOL;  Surgeon: Christena Deem, MD;  Location: [facility] ENDOSCOPY;  Service: Endoscopy;  Laterality: N/A;   COLONOSCOPY WITH PROPOFOL N/A 10/07/2022   Procedure: COLONOSCOPY WITH PROPOFOL;  Surgeon: [name], MD;  Location: [facility];  Service: Endoscopy;  Laterality: N/A;   WISDOM TOOTH EXTRACTION     Patient Active Problem List   Diagnosis Date Noted   Personal history of  colonic polyps 10/07/2022   PFO (patent foramen ovale)    CAD (coronary artery disease), native coronary artery    Acute CVA (cerebrovascular accident) (HCC) 06/15/2018   Depression 10/16/2017   Migraine 10/16/2017   Hyperglycemia, unspecified 07/10/2017   Hyperglycemia 07/10/2017   H/O dizziness 08/02/2016   Cervical disc disorder with radiculopathy of cervical region 04/12/2016   Nonallopathic lesion of thoracic region 04/12/2016   Nonallopathic lesion of rib cage 04/12/2016   Facet arthritis of cervical region 03/21/2016   Hyperlipidemia 01/26/2015   Labral tear of shoulder, right, subsequent encounter 12/04/2014   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   Impingement syndrome of right shoulder 05/28/2014   Right supraspinatus tenosynovitis 05/28/2014   Arthritis pain 04/03/2014   Fatigue 04/03/2014   Cervical intraepithelial neoplasia grade 1 06/20/2012   IBS (irritable bowel syndrome) 06/20/2012   Lumbar disc disease 06/20/2012    PCP: Lynnea Ferrier MD  REFERRING PROVIDER: Brown,Christopher R MD  REFERRING DIAG: cervical radiculopathy  THERAPY DIAG:  Cervicalgia  Abnormal posture  Other abnormalities of gait and mobility  Muscle weakness (generalized)  Rationale for Evaluation and Treatment: Rehabilitation  ONSET DATE: 06/14/22  SUBJECTIVE:  SUBJECTIVE STATEMENT: Patient had a stressful weekend due to work. Having 2.5/10 pain in neck/upper trap/supraspinatus .  Hand dominance: Right  PERTINENT HISTORY:  Patient is returning to PT for s/p cervical fusion C5-7 with bilateral foraminotomies at C6-7 on 06/14/22. Patient has a history of C6-7 arthroplasty and has been treated by this clinician before. PMH includes anxiety, CAD, depression, arthritis of cervical region, gastritis, GERD,  cancer, stroke, HLD, HTN, IB, lumbar disc disease (fusion x2 ), migraine, numbness of L hand, headaches, and restless leg syndrome.  Patient had posterior cervical fusion from C5-C7 and bilateral foraminotomies at C6-7 on 06/14/22; Only restriction is no lifting >15 lb  PAIN:  Are you having pain? Yes: NPRS scale: 3/10 Pain location: lower cervical and across traps Pain description: aching Aggravating factors: positioning Relieving factors: positioning, heat  PRECAUTIONS: Cervical  WEIGHT BEARING RESTRICTIONS:  no lifting>15 lb   FALLS:  Has patient fallen in last 6 months? No  LIVING ENVIRONMENT: Lives with: lives with their spouse Lives in: House/apartment Stairs: Yes: External:   steps;     OCCUPATION: Chiropodist   PLOF: Independent  PATIENT GOALS: to have more range of motion  NEXT MD VISIT: July 3rd, 2024  OBJECTIVE:   DIAGNOSTIC FINDINGS:  IMPRESSION: 1. Anterior cervical fusion at C6-7. Susceptibility artifact partially obscures evaluation of the neural foramina and central canal. No significant right foraminal stenosis. Left uncovertebral degenerative changes with moderate left foraminal stenosis. 2. At C2-3 there is a minimal broad-based disc bulge. Moderate right facet arthropathy. Mild right foraminal stenosis. 3. At C3-4 there is a minimal broad-based disc bulge. Moderate right facet arthropathy. Mild left foraminal stenosis. 4. At C4-5 there is a minimal broad-based disc bulge. Mild right facet arthropathy. Moderate left foraminal stenosis. 5. At C7-T1 there is a minimal broad-based disc bulge. Mild right and moderate left foraminal stenosis.    IMPRESSION: 1. Anterior cervical fusion at C6-7. Susceptibility artifact partially obscures evaluation of the neural foramina and central canal. No significant right foraminal stenosis. Left uncovertebral degenerative changes with moderate left foraminal stenosis. 2. At C2-3 there is a minimal  broad-based disc bulge. Moderate right facet arthropathy. Mild right foraminal stenosis. 3. At C3-4 there is a minimal broad-based disc bulge. Moderate right facet arthropathy. Mild left foraminal stenosis. 4. At C4-5 there is a minimal broad-based disc bulge. Mild right facet arthropathy. Moderate left foraminal stenosis. 5. At C7-T1 there is a minimal broad-based disc bulge. Mild right and moderate left foraminal stenosis.  PATIENT SURVEYS:  NDI 42% FOTO 45  COGNITION: Overall cognitive status: Within functional limits for tasks assessed  SENSATION: Light touch: WFL; occasional R hand numbness with sleep   POSTURE: rounded shoulders  PALPATION: Hardware present, tenderness at C5-6, C6-7.    CERVICAL ROM:   Active ROM A/PROM (deg) eval PN 5/15: RECERT 7/15  Flexion 24 39 40  Extension 25 30 31   Right lateral flexion 9 20 19   Left lateral flexion 19 21 21   Right rotation 18 30 35  Left rotation 19 31 39   (Blank rows = not tested)  UPPER EXTREMITY ROM:  Active ROM Right eval Left eval  Shoulder flexion [facility]  Shoulder extension    Shoulder abduction 170 140   (Blank rows = not tested)  UPPER EXTREMITY MMT:  MMT Right eval Left eval  Shoulder flexion 4+ 4+  Shoulder extension 4+ 4+  Shoulder abduction 4+ 4+  Shoulder adduction 4+ 4+  Shoulder extension 4+ 4+  Elbow flexion  4+ 4+  Elbow extension 4+ 4+   (Blank rows = not tested)    TODAY'S TREATMENT:                                                                                                                              DATE: 12/28/22    Manual Therapy:  Sub-Occiput release 5x30 seconds  Cervical and thoracic grade II mobs x 4 minutes Cervical sidebend with rotation 3x30 seconds   There.Ex: Green swiss ball:  -I's, Y's, W's 10x each; rest break between  Standing wall posture 5x20 seconds Standing Y's with GTB 10x  Supine: scapular retraction with depression and lat pulldown GTB 10x  each UE   Education on upper trap stretching for reduction of tightening throughout session.   Trigger Point Dry Needling (TDN), unbilled Education performed with patient regarding potential benefit of TDN. Reviewed precautions and risks with patient. Reviewed special precautions/risks over lung fields which include pneumothorax. Reviewed signs and symptoms of pneumothorax and advised pt to go to ER immediately if these symptoms develop advise them of dry needling treatment. Extensive time spent with pt to ensure full understanding of TDN risks. Pt provided verbal consent to treatment. TDN performed to  with 0.25 x 40 single needle placements with local twitch response (LTR). Pistoning technique utilized. Improved pain-free motion following intervention. L upper trap x 2 minutes      PATIENT EDUCATION:  Education details: goals, POC, HEP Person educated: Patient Education method: Explanation, Demonstration, Tactile cues, and Verbal cues Education comprehension: verbalized understanding, returned demonstration, verbal cues required, and tactile cues required  HOME EXERCISE PROGRAM: Access Code: [email or web address] URL: [email or web address] Date: 08/24/2022 Prepared by: Precious Bard  Exercises - Supine Cervical Retraction with Towel  - 1 x daily - 7 x weekly - 2 sets - 10 reps - 5 hold - Seated Scapular Retraction  - 1 x daily - 7 x weekly - 2 sets - 10 reps - 5 hold - Supine Cervical Rotation AROM on Pillow  - 1 x daily - 7 x weekly - 2 sets - 10 reps - 5 hold - Neck Sidebending  - 1 x daily - 7 x weekly - 2 sets - 10 reps - 5 hold - Supine Cervical Flexion Extension on Pillow  - 1 x daily - 7 x weekly - 2 sets - 10 reps - 5 hold  ASSESSMENT:  CLINICAL IMPRESSION: Patient presents with significant trigger point of L upper trap and supraspinatus that is resolved by end of session. Patient is able to tolerate increased postural strengthening with decreased need for rest breaks and  decreased cueing for scap and GH positioning.  Pt will benefit from skilled physical therapy to reduce pain,improve ROM, and return to PLOF.  OBJECTIVE IMPAIRMENTS: decreased activity tolerance, decreased endurance, decreased ROM, decreased strength, hypomobility, increased muscle spasms, impaired flexibility, improper body mechanics, postural dysfunction, and pain.   ACTIVITY LIMITATIONS: carrying, lifting, sitting, standing, reach over head,  and work  PARTICIPATION LIMITATIONS: meal prep, cleaning, laundry, driving, shopping, community activity, occupation, and yard work  PERSONAL FACTORS: Past/current experiences, Catering manager, Sex, Time since onset of injury/illness/exacerbation, and 3+ comorbidities: anxiety, CAD, depression, arthritis of cervical region, gastritis, GERD, cancer, stroke, HLD, HTN, IB, lumbar disc disease (fusion x2 ), migraine, numbness of L hand, headaches, and restless leg syndrome  are also affecting patient's functional outcome.   REHAB POTENTIAL: Fair    CLINICAL DECISION MAKING: Evolving/moderate complexity  EVALUATION COMPLEXITY: Moderate   GOALS: Goals reviewed with patient? Yes  SHORT TERM GOALS: Target date: 11/16/2022      Patient will be independent in home exercise program to improve strength/mobility for better functional independence with ADLs. Baseline: 3/27: given 5/15; in progress  7/15: compliant  Goal status: MET    LONG TERM GOALS: Target date: 02/06/2023    Patient will increase FOTO score to equal to or greater than  55   to demonstrate statistically significant improvement in mobility and quality of life.  Baseline: 45 5/15: 53% 7/15: 50% Goal status: Partially Met   2.  Patient will reduce Neck Disability Index score to <10% to demonstrate minimal disability with ADL's including improved sleeping tolerance, sitting tolerance, etc for better mobility at home and work. Baseline: 42% 5/15: 24% 7/15: 20%  Goal status: Partially Met  3.   Patient will improve cervical ROM by 20 degrees flexion, sidebending, and rotation for improved quality of life and ability to drive.  Baseline: 3/27: see above 5/15: see above 7/15: see above Goal status: Partially Met  4.  Patient will return to full work and recreational activities without compensation or pain increase of >3/10.  Baseline: 3/27: unable to perform 5/15: returned to work, beginning recreation of sewing but needs to take breaks. 7/15: 3/10 pain with quilting and bird watching  Goal status: Partially Met    PLAN:  PT FREQUENCY: 2x/week  PT DURATION: 8 weeks  PLANNED INTERVENTIONS: Therapeutic exercises, Therapeutic activity, Neuromuscular re-education, Balance training, Gait training, Patient/Family education, Self Care, Joint mobilization, Stair training, Vestibular training, Canalith repositioning, Dry Needling, Spinal mobilization, Cryotherapy, Moist heat, scar mobilization, Traction, Ultrasound, Manual therapy, and Re-evaluation  PLAN FOR NEXT SESSION: upper trap release,  postural strengthening.   Precious Bard PT  Physical Therapist- [place]  [facility]  12/28/2022, 7:58 AM

## 2022-12-28 ENCOUNTER — Ambulatory Visit: Payer: Commercial Managed Care - PPO

## 2022-12-28 DIAGNOSIS — M542 Cervicalgia: Secondary | ICD-10-CM

## 2022-12-28 DIAGNOSIS — R2689 Other abnormalities of gait and mobility: Secondary | ICD-10-CM | POA: Diagnosis not present

## 2022-12-28 DIAGNOSIS — M6281 Muscle weakness (generalized): Secondary | ICD-10-CM

## 2022-12-28 DIAGNOSIS — R293 Abnormal posture: Secondary | ICD-10-CM | POA: Diagnosis not present
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
## 2023-01-04 ENCOUNTER — Ambulatory Visit: Payer: Commercial Managed Care - PPO | Attending: Orthopedic Surgery

## 2023-01-04 DIAGNOSIS — R2689 Other abnormalities of gait and mobility: Secondary | ICD-10-CM | POA: Insufficient documentation

## 2023-01-04 DIAGNOSIS — R293 Abnormal posture: Secondary | ICD-10-CM | POA: Diagnosis not present

## 2023-01-04 DIAGNOSIS — M542 Cervicalgia: Secondary | ICD-10-CM | POA: Insufficient documentation

## 2023-01-04 DIAGNOSIS — M6281 Muscle weakness (generalized): Secondary | ICD-10-CM | POA: Diagnosis not present

## 2023-01-05 ENCOUNTER — Other Ambulatory Visit: Payer: Self-pay

## 2023-01-05 MED ORDER — CYCLOBENZAPRINE HCL 10 MG PO TABS
10.0000 mg | ORAL_TABLET | Freq: Every evening | ORAL | 1 refills | Status: AC | PRN
  Filled 2023-01-05: qty 20, 20d supply, fill #0
  Filled 2023-05-07 – 2023-05-19 (×2): qty 20, 20d supply, fill #1

## 2023-01-05 MED ORDER — PRAMIPEXOLE DIHYDROCHLORIDE 0.25 MG PO TABS
0.2500 mg | ORAL_TABLET | Freq: Every day | ORAL | 3 refills | Status: AC
  Filled 2023-01-05: qty 90, 90d supply, fill #0

## 2023-01-05 MED ORDER — TIZANIDINE HCL 2 MG PO TABS
2.0000 mg | ORAL_TABLET | Freq: Three times a day (TID) | ORAL | 1 refills | Status: DC | PRN
  Filled 2023-01-05: qty 60, 20d supply, fill #0

## 2023-01-16 ENCOUNTER — Ambulatory Visit: Payer: Commercial Managed Care - PPO

## 2023-01-17 NOTE — Therapy (Signed)
OUTPATIENT PHYSICAL THERAPY CERVICAL TREATMENT   Patient Name: Lori Morales MRN: [id number] DOB:08-14-1960, 62 y.o., female Today's Date: 01/18/2023  END OF SESSION:  PT End of Session - 01/18/23 0714     Visit Number 23    Number of Visits 34    Date for PT Re-Evaluation 02/06/23    Authorization Type [employer] employee [employer]    Authorization Time Period 08/24/22-10/19/22    Progress Note Due on Visit 10    PT Start Time 0714    PT Stop Time 0758    PT Time Calculation (min) 44 min    Activity Tolerance Patient tolerated treatment well;Patient limited by pain    Behavior During Therapy [facility] for tasks assessed/performed                               Past Medical History:  Diagnosis Date   Arthritis    CAD (coronary artery disease), native coronary artery    mild non calcified plaque in mid LAD and prox LCx on coronary CTA 01/2019   Depression    GERD (gastroesophageal reflux disease)    Headache    migraines   Hx of squamous cell carcinoma 07/11/2016   Right distal dorsum lat forearm    Hypertension    IBS (irritable bowel syndrome)    Palpitations    PFO (patent foramen ovale)    small by cardiac CTA 01/2019   PONV (postoperative nausea and vomiting)    Restless legs syndrome    Squamous cell carcinoma of skin 12/202/2017   right distal dorsum lateral forearm   Past Surgical History:  Procedure Laterality Date   ABDOMINOPLASTY     BACK SURGERY     1 laminectomy, 1 fusion   COLONOSCOPY     COLONOSCOPY WITH PROPOFOL N/A 01/17/2017   Procedure: COLONOSCOPY WITH PROPOFOL;  Surgeon: Christena Deem, MD;  Location: [facility] ENDOSCOPY;  Service: Endoscopy;  Laterality: N/A;   COLONOSCOPY WITH PROPOFOL N/A 10/07/2022   Procedure: COLONOSCOPY WITH PROPOFOL;  Surgeon: [name], MD;  Location: [facility];  Service: Endoscopy;  Laterality: N/A;   WISDOM TOOTH EXTRACTION     Patient Active Problem List   Diagnosis Date Noted   Personal history of  colonic polyps 10/07/2022   PFO (patent foramen ovale)    CAD (coronary artery disease), native coronary artery    Acute CVA (cerebrovascular accident) (HCC) 06/15/2018   Depression 10/16/2017   Migraine 10/16/2017   Hyperglycemia, unspecified 07/10/2017   Hyperglycemia 07/10/2017   H/O dizziness 08/02/2016   Cervical disc disorder with radiculopathy of cervical region 04/12/2016   Nonallopathic lesion of thoracic region 04/12/2016   Nonallopathic lesion of rib cage 04/12/2016   Facet arthritis of cervical region 03/21/2016   Hyperlipidemia 01/26/2015   Labral tear of shoulder, right, subsequent encounter 12/04/2014   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   Impingement syndrome of right shoulder 05/28/2014   Right supraspinatus tenosynovitis 05/28/2014   Arthritis pain 04/03/2014   Fatigue 04/03/2014   Cervical intraepithelial neoplasia grade 1 06/20/2012   IBS (irritable bowel syndrome) 06/20/2012   Lumbar disc disease 06/20/2012    PCP: Lynnea Ferrier MD  REFERRING PROVIDER: Brown,Christopher R MD  REFERRING DIAG: cervical radiculopathy  THERAPY DIAG:  Cervicalgia  Abnormal posture  Other abnormalities of gait and mobility  Muscle weakness (generalized)  Rationale for Evaluation and Treatment: Rehabilitation  ONSET DATE: 06/14/22  SUBJECTIVE:  SUBJECTIVE STATEMENT: Patient reports increased stiffness due to not being seen for over a week.   Hand dominance: Right  PERTINENT HISTORY:  Patient is returning to PT for s/p cervical fusion C5-7 with bilateral foraminotomies at C6-7 on 06/14/22. Patient has a history of C6-7 arthroplasty and has been treated by this clinician before. PMH includes anxiety, CAD, depression, arthritis of cervical region, gastritis, GERD, cancer, stroke, HLD,  HTN, IB, lumbar disc disease (fusion x2 ), migraine, numbness of L hand, headaches, and restless leg syndrome.  Patient had posterior cervical fusion from C5-C7 and bilateral foraminotomies at C6-7 on 06/14/22; Only restriction is no lifting >15 lb  PAIN:  Are you having pain? Yes: NPRS scale: 3/10 Pain location: lower cervical and across traps Pain description: aching Aggravating factors: positioning Relieving factors: positioning, heat  PRECAUTIONS: Cervical  WEIGHT BEARING RESTRICTIONS:  no lifting>15 lb   FALLS:  Has patient fallen in last 6 months? No  LIVING ENVIRONMENT: Lives with: lives with their spouse Lives in: House/apartment Stairs: Yes: External:   steps;     OCCUPATION: Chiropodist   PLOF: Independent  PATIENT GOALS: to have more range of motion  NEXT MD VISIT: July 3rd, 2024  OBJECTIVE:   DIAGNOSTIC FINDINGS:  IMPRESSION: 1. Anterior cervical fusion at C6-7. Susceptibility artifact partially obscures evaluation of the neural foramina and central canal. No significant right foraminal stenosis. Left uncovertebral degenerative changes with moderate left foraminal stenosis. 2. At C2-3 there is a minimal broad-based disc bulge. Moderate right facet arthropathy. Mild right foraminal stenosis. 3. At C3-4 there is a minimal broad-based disc bulge. Moderate right facet arthropathy. Mild left foraminal stenosis. 4. At C4-5 there is a minimal broad-based disc bulge. Mild right facet arthropathy. Moderate left foraminal stenosis. 5. At C7-T1 there is a minimal broad-based disc bulge. Mild right and moderate left foraminal stenosis.    IMPRESSION: 1. Anterior cervical fusion at C6-7. Susceptibility artifact partially obscures evaluation of the neural foramina and central canal. No significant right foraminal stenosis. Left uncovertebral degenerative changes with moderate left foraminal stenosis. 2. At C2-3 there is a minimal broad-based disc bulge.  Moderate right facet arthropathy. Mild right foraminal stenosis. 3. At C3-4 there is a minimal broad-based disc bulge. Moderate right facet arthropathy. Mild left foraminal stenosis. 4. At C4-5 there is a minimal broad-based disc bulge. Mild right facet arthropathy. Moderate left foraminal stenosis. 5. At C7-T1 there is a minimal broad-based disc bulge. Mild right and moderate left foraminal stenosis.  PATIENT SURVEYS:  NDI 42% FOTO 45  COGNITION: Overall cognitive status: Within functional limits for tasks assessed  SENSATION: Light touch: WFL; occasional R hand numbness with sleep   POSTURE: rounded shoulders  PALPATION: Hardware present, tenderness at C5-6, C6-7.    CERVICAL ROM:   Active ROM A/PROM (deg) eval PN 5/15: RECERT 7/15  Flexion 24 39 40  Extension 25 30 31   Right lateral flexion 9 20 19   Left lateral flexion 19 21 21   Right rotation 18 30 35  Left rotation 19 31 39   (Blank rows = not tested)  UPPER EXTREMITY ROM:  Active ROM Right eval Left eval  Shoulder flexion [facility]  Shoulder extension    Shoulder abduction 170 140   (Blank rows = not tested)  UPPER EXTREMITY MMT:  MMT Right eval Left eval  Shoulder flexion 4+ 4+  Shoulder extension 4+ 4+  Shoulder abduction 4+ 4+  Shoulder adduction 4+ 4+  Shoulder extension 4+ 4+  Elbow flexion 4+  4+  Elbow extension 4+ 4+   (Blank rows = not tested)    TODAY'S TREATMENT:                                                                                                                              DATE: 01/18/23    Manual Therapy:  Sub-Occiput release 5x30 seconds  Cervical and thoracic grade II mobs x 4 minutes Cervical sidebend with rotation 4x30 seconds   There.Ex: Supine:  Cervical rotation with scapular retraction 10x  Chin tuck 10x  scapular retraction with depression and lat pulldown GTB 10x each UE  GTB overhead & 10x Wall abduction flys  Seated: Towel distraction cervical  extension 10x Towel distraction with side bend 10x   Education on upper trap stretching for reduction of tightening throughout session.   Trigger Point Dry Needling (TDN), unbilled Education performed with patient regarding potential benefit of TDN. Reviewed precautions and risks with patient. Reviewed special precautions/risks over lung fields which include pneumothorax. Reviewed signs and symptoms of pneumothorax and advised pt to go to ER immediately if these symptoms develop advise them of dry needling treatment. Extensive time spent with pt to ensure full understanding of TDN risks. Pt provided verbal consent to treatment. TDN performed to  with 0.25 x 40 single needle placements with local twitch response (LTR). Pistoning technique utilized. Improved pain-free motion following intervention. L and R upper trap x 2 minutes      PATIENT EDUCATION:  Education details: goals, POC, HEP Person educated: Patient Education method: Explanation, Demonstration, Tactile cues, and Verbal cues Education comprehension: verbalized understanding, returned demonstration, verbal cues required, and tactile cues required  HOME EXERCISE PROGRAM: Access Code: [email or web address] URL: [email or web address] Date: 08/24/2022 Prepared by: Precious Bard  Exercises - Supine Cervical Retraction with Towel  - 1 x daily - 7 x weekly - 2 sets - 10 reps - 5 hold - Seated Scapular Retraction  - 1 x daily - 7 x weekly - 2 sets - 10 reps - 5 hold - Supine Cervical Rotation AROM on Pillow  - 1 x daily - 7 x weekly - 2 sets - 10 reps - 5 hold - Neck Sidebending  - 1 x daily - 7 x weekly - 2 sets - 10 reps - 5 hold - Supine Cervical Flexion Extension on Pillow  - 1 x daily - 7 x weekly - 2 sets - 10 reps - 5 hold  ASSESSMENT:  CLINICAL IMPRESSION: Patient has significant large trigger points in bilateral upper traps released with TDN. She is highly motivated throughout session. She requires scapular retraction  cueing for postural correction. Pt will benefit from skilled physical therapy to reduce pain,improve ROM, and return to PLOF.  OBJECTIVE IMPAIRMENTS: decreased activity tolerance, decreased endurance, decreased ROM, decreased strength, hypomobility, increased muscle spasms, impaired flexibility, improper body mechanics, postural dysfunction, and pain.   ACTIVITY LIMITATIONS: carrying, lifting, sitting, standing, reach over head, and work  PARTICIPATION LIMITATIONS:  meal prep, cleaning, laundry, driving, shopping, community activity, occupation, and yard work  PERSONAL FACTORS: Past/current experiences, Profession, Sex, Time since onset of injury/illness/exacerbation, and 3+ comorbidities: anxiety, CAD, depression, arthritis of cervical region, gastritis, GERD, cancer, stroke, HLD, HTN, IB, lumbar disc disease (fusion x2 ), migraine, numbness of L hand, headaches, and restless leg syndrome  are also affecting patient's functional outcome.   REHAB POTENTIAL: Fair    CLINICAL DECISION MAKING: Evolving/moderate complexity  EVALUATION COMPLEXITY: Moderate   GOALS: Goals reviewed with patient? Yes  SHORT TERM GOALS: Target date: 11/16/2022      Patient will be independent in home exercise program to improve strength/mobility for better functional independence with ADLs. Baseline: 3/27: given 5/15; in progress  7/15: compliant  Goal status: MET    LONG TERM GOALS: Target date: 02/06/2023    Patient will increase FOTO score to equal to or greater than  55   to demonstrate statistically significant improvement in mobility and quality of life.  Baseline: 45 5/15: 53% 7/15: 50% Goal status: Partially Met   2.  Patient will reduce Neck Disability Index score to <10% to demonstrate minimal disability with ADL's including improved sleeping tolerance, sitting tolerance, etc for better mobility at home and work. Baseline: 42% 5/15: 24% 7/15: 20%  Goal status: Partially Met  3.  Patient will  improve cervical ROM by 20 degrees flexion, sidebending, and rotation for improved quality of life and ability to drive.  Baseline: 3/27: see above 5/15: see above 7/15: see above Goal status: Partially Met  4.  Patient will return to full work and recreational activities without compensation or pain increase of >3/10.  Baseline: 3/27: unable to perform 5/15: returned to work, beginning recreation of sewing but needs to take breaks. 7/15: 3/10 pain with quilting and bird watching  Goal status: Partially Met    PLAN:  PT FREQUENCY: 2x/week  PT DURATION: 8 weeks  PLANNED INTERVENTIONS: Therapeutic exercises, Therapeutic activity, Neuromuscular re-education, Balance training, Gait training, Patient/Family education, Self Care, Joint mobilization, Stair training, Vestibular training, Canalith repositioning, Dry Needling, Spinal mobilization, Cryotherapy, Moist heat, scar mobilization, Traction, Ultrasound, Manual therapy, and Re-evaluation  PLAN FOR NEXT SESSION: upper trap release,  postural strengthening.   Precious Bard PT  Physical Therapist- [place]  [facility]  01/18/2023, 7:59 AM

## 2023-01-18 ENCOUNTER — Ambulatory Visit: Payer: Commercial Managed Care - PPO

## 2023-01-18 DIAGNOSIS — R2689 Other abnormalities of gait and mobility: Secondary | ICD-10-CM

## 2023-01-18 DIAGNOSIS — M542 Cervicalgia: Secondary | ICD-10-CM

## 2023-01-18 DIAGNOSIS — M6281 Muscle weakness (generalized): Secondary | ICD-10-CM | POA: Diagnosis not present

## 2023-01-18 DIAGNOSIS — R293 Abnormal posture: Secondary | ICD-10-CM

## 2023-01-23 ENCOUNTER — Ambulatory Visit: Payer: Commercial Managed Care - PPO

## 2023-01-25 ENCOUNTER — Ambulatory Visit: Payer: Commercial Managed Care - PPO

## 2023-01-25 DIAGNOSIS — M6281 Muscle weakness (generalized): Secondary | ICD-10-CM

## 2023-01-25 DIAGNOSIS — R2689 Other abnormalities of gait and mobility: Secondary | ICD-10-CM | POA: Diagnosis not present

## 2023-01-25 DIAGNOSIS — M542 Cervicalgia: Secondary | ICD-10-CM

## 2023-01-25 DIAGNOSIS — R293 Abnormal posture: Secondary | ICD-10-CM | POA: Diagnosis not present

## 2023-01-25 NOTE — Therapy (Signed)
OUTPATIENT PHYSICAL THERAPY CERVICAL TREATMENT   Patient Name: Lori Morales MRN: [id number] DOB:06-Apr-1961, 62 y.o., female Today's Date: 01/25/2023  END OF SESSION:  PT End of Session - 01/25/23 0714     Visit Number 24    Number of Visits 34    Date for PT Re-Evaluation 02/06/23    Authorization Type [employer] employee [employer]    Authorization Time Period 08/24/22-10/19/22    Progress Note Due on Visit 10    PT Start Time 0714    PT Stop Time 0759    PT Time Calculation (min) 45 min    Activity Tolerance Patient tolerated treatment well;Patient limited by pain    Behavior During Therapy [facility] for tasks assessed/performed                               Past Medical History:  Diagnosis Date   Arthritis    CAD (coronary artery disease), native coronary artery    mild non calcified plaque in mid LAD and prox LCx on coronary CTA 01/2019   Depression    GERD (gastroesophageal reflux disease)    Headache    migraines   Hx of squamous cell carcinoma 07/11/2016   Right distal dorsum lat forearm    Hypertension    IBS (irritable bowel syndrome)    Palpitations    PFO (patent foramen ovale)    small by cardiac CTA 01/2019   PONV (postoperative nausea and vomiting)    Restless legs syndrome    Squamous cell carcinoma of skin 12/202/2017   right distal dorsum lateral forearm   Past Surgical History:  Procedure Laterality Date   ABDOMINOPLASTY     BACK SURGERY     1 laminectomy, 1 fusion   COLONOSCOPY     COLONOSCOPY WITH PROPOFOL N/A 01/17/2017   Procedure: COLONOSCOPY WITH PROPOFOL;  Surgeon: Christena Deem, MD;  Location: [facility] END[facility];  Service: Endoscopy;  Laterality: N/A;   COLONOSCOPY WITH PROPOFOL N/A 10/07/2022   Procedure: COLONOSCOPY WITH PROPOFOL;  Surgeon: [name], MD;  Location: [facility];  Service: Endoscopy;  Laterality: N/A;   WISDOM TOOTH EXTRACTION     Patient Active Problem List   Diagnosis Date Noted   Personal history of  colonic polyps 10/07/2022   PFO (patent foramen ovale)    CAD (coronary artery disease), native coronary artery    Acute CVA (cerebrovascular accident) (HCC) 06/15/2018   Depression 10/16/2017   Migraine 10/16/2017   Hyperglycemia, unspecified 07/10/2017   Hyperglycemia 07/10/2017   H/O dizziness 08/02/2016   Cervical disc disorder with radiculopathy of cervical region 04/12/2016   Nonallopathic lesion of thoracic region 04/12/2016   Nonallopathic lesion of rib cage 04/12/2016   Facet arthritis of cervical region 03/21/2016   Hyperlipidemia 01/26/2015   Labral tear of shoulder, right, subsequent encounter 12/04/2014   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   Impingement syndrome of right shoulder 05/28/2014   Right supraspinatus tenosynovitis 05/28/2014   Arthritis pain 04/03/2014   Fatigue 04/03/2014   Cervical intraepithelial neoplasia grade 1 06/20/2012   IBS (irritable bowel syndrome) 06/20/2012   Lumbar disc disease 06/20/2012    PCP: Lynnea Ferrier MD  REFERRING PROVIDER: Brown,Christopher R MD  REFERRING DIAG: cervical radiculopathy  THERAPY DIAG:  Cervicalgia  Abnormal posture  Other abnormalities of gait and mobility  Muscle weakness (generalized)  Rationale for Evaluation and Treatment: Rehabilitation  ONSET DATE: 06/14/22  SUBJECTIVE:  SUBJECTIVE STATEMENT: Patient has R sided neck tightness. Is aware that she is coming close to discharge.   Hand dominance: Right  PERTINENT HISTORY:  Patient is returning to PT for s/p cervical fusion C5-7 with bilateral foraminotomies at C6-7 on 06/14/22. Patient has a history of C6-7 arthroplasty and has been treated by this clinician before. PMH includes anxiety, CAD, depression, arthritis of cervical region, gastritis, GERD, cancer,  stroke, HLD, HTN, IB, lumbar disc disease (fusion x2 ), migraine, numbness of L hand, headaches, and restless leg syndrome.  Patient had posterior cervical fusion from C5-C7 and bilateral foraminotomies at C6-7 on 06/14/22; Only restriction is no lifting >15 lb  PAIN:  Are you having pain? Yes: NPRS scale: 3/10 Pain location: lower cervical and across traps Pain description: aching Aggravating factors: positioning Relieving factors: positioning, heat  PRECAUTIONS: Cervical  WEIGHT BEARING RESTRICTIONS:  no lifting>15 lb   FALLS:  Has patient fallen in last 6 months? No  LIVING ENVIRONMENT: Lives with: lives with their spouse Lives in: House/apartment Stairs: Yes: External:   steps;     OCCUPATION: Chiropodist   PLOF: Independent  PATIENT GOALS: to have more range of motion  NEXT MD VISIT: July 3rd, 2024  OBJECTIVE:   DIAGNOSTIC FINDINGS:  IMPRESSION: 1. Anterior cervical fusion at C6-7. Susceptibility artifact partially obscures evaluation of the neural foramina and central canal. No significant right foraminal stenosis. Left uncovertebral degenerative changes with moderate left foraminal stenosis. 2. At C2-3 there is a minimal broad-based disc bulge. Moderate right facet arthropathy. Mild right foraminal stenosis. 3. At C3-4 there is a minimal broad-based disc bulge. Moderate right facet arthropathy. Mild left foraminal stenosis. 4. At C4-5 there is a minimal broad-based disc bulge. Mild right facet arthropathy. Moderate left foraminal stenosis. 5. At C7-T1 there is a minimal broad-based disc bulge. Mild right and moderate left foraminal stenosis.    IMPRESSION: 1. Anterior cervical fusion at C6-7. Susceptibility artifact partially obscures evaluation of the neural foramina and central canal. No significant right foraminal stenosis. Left uncovertebral degenerative changes with moderate left foraminal stenosis. 2. At C2-3 there is a minimal broad-based disc  bulge. Moderate right facet arthropathy. Mild right foraminal stenosis. 3. At C3-4 there is a minimal broad-based disc bulge. Moderate right facet arthropathy. Mild left foraminal stenosis. 4. At C4-5 there is a minimal broad-based disc bulge. Mild right facet arthropathy. Moderate left foraminal stenosis. 5. At C7-T1 there is a minimal broad-based disc bulge. Mild right and moderate left foraminal stenosis.  PATIENT SURVEYS:  NDI 42% FOTO 45  COGNITION: Overall cognitive status: Within functional limits for tasks assessed  SENSATION: Light touch: WFL; occasional R hand numbness with sleep   POSTURE: rounded shoulders  PALPATION: Hardware present, tenderness at C5-6, C6-7.    CERVICAL ROM:   Active ROM A/PROM (deg) eval PN 5/15: RECERT 7/15  Flexion 24 39 40  Extension 25 30 31   Right lateral flexion 9 20 19   Left lateral flexion 19 21 21   Right rotation 18 30 35  Left rotation 19 31 39   (Blank rows = not tested)  UPPER EXTREMITY ROM:  Active ROM Right eval Left eval  Shoulder flexion [facility]  Shoulder extension    Shoulder abduction 170 140   (Blank rows = not tested)  UPPER EXTREMITY MMT:  MMT Right eval Left eval  Shoulder flexion 4+ 4+  Shoulder extension 4+ 4+  Shoulder abduction 4+ 4+  Shoulder adduction 4+ 4+  Shoulder extension 4+ 4+  Elbow  flexion 4+ 4+  Elbow extension 4+ 4+   (Blank rows = not tested)    TODAY'S TREATMENT:                                                                                                                              DATE: 01/25/23    Manual Therapy:  Sub-Occiput release 3x30 seconds  Cervical sidebend with rotation 4x30 seconds Palpation for tension   There.Ex: Supine:  Cervical rotation with scapular retraction 10x  Chin tuck 10x 10 second holds  scapular retraction with depression and lat pulldown GTB 10x each UE   Wall: Scapular retraction/protraction pushes 10x  Ball alphabet each UE;  challenging to maintain scapular position.  PVC overhead raise 10x  Seated: GTB row 15x Cervical circles 10x each direction,  Upper trap stretch 30 seconds each direction   Education on upper trap stretching for reduction of tightening throughout session.   Trigger Point Dry Needling (TDN), unbilled Education performed with patient regarding potential benefit of TDN. Reviewed precautions and risks with patient. Reviewed special precautions/risks over lung fields which include pneumothorax. Reviewed signs and symptoms of pneumothorax and advised pt to go to ER immediately if these symptoms develop advise them of dry needling treatment. Extensive time spent with pt to ensure full understanding of TDN risks. Pt provided verbal consent to treatment. TDN performed to  with 0.25 x 40 single needle placements with local twitch response (LTR). Pistoning technique utilized. Improved pain-free motion following intervention. R upper trap x 2 minutes      PATIENT EDUCATION:  Education details: goals, POC, HEP Person educated: Patient Education method: Explanation, Demonstration, Tactile cues, and Verbal cues Education comprehension: verbalized understanding, returned demonstration, verbal cues required, and tactile cues required  HOME EXERCISE PROGRAM: Access Code: [email or web address] URL: [email or web address] Date: 08/24/2022 Prepared by: Precious Bard  Exercises - Supine Cervical Retraction with Towel  - 1 x daily - 7 x weekly - 2 sets - 10 reps - 5 hold - Seated Scapular Retraction  - 1 x daily - 7 x weekly - 2 sets - 10 reps - 5 hold - Supine Cervical Rotation AROM on Pillow  - 1 x daily - 7 x weekly - 2 sets - 10 reps - 5 hold - Neck Sidebending  - 1 x daily - 7 x weekly - 2 sets - 10 reps - 5 hold - Supine Cervical Flexion Extension on Pillow  - 1 x daily - 7 x weekly - 2 sets - 10 reps - 5 hold  ASSESSMENT:  CLINICAL IMPRESSION: Patient tolerates progressive postural and scapular  stabilization training interventions. She tolerates prolonged holds with neck musculature interventions. Patient aware of upcoming end of cert. Agreeable to discharge at end of cert pending any new changes.  Pt will benefit from skilled physical therapy to reduce pain,improve ROM, and return to PLOF.  OBJECTIVE IMPAIRMENTS: decreased activity tolerance, decreased endurance, decreased ROM, decreased strength, hypomobility, increased muscle spasms,  impaired flexibility, improper body mechanics, postural dysfunction, and pain.   ACTIVITY LIMITATIONS: carrying, lifting, sitting, standing, reach over head, and work  PARTICIPATION LIMITATIONS: meal prep, cleaning, laundry, driving, shopping, community activity, occupation, and yard work  PERSONAL FACTORS: Past/current experiences, Catering manager, Sex, Time since onset of injury/illness/exacerbation, and 3+ comorbidities: anxiety, CAD, depression, arthritis of cervical region, gastritis, GERD, cancer, stroke, HLD, HTN, IB, lumbar disc disease (fusion x2 ), migraine, numbness of L hand, headaches, and restless leg syndrome  are also affecting patient's functional outcome.   REHAB POTENTIAL: Fair    CLINICAL DECISION MAKING: Evolving/moderate complexity  EVALUATION COMPLEXITY: Moderate   GOALS: Goals reviewed with patient? Yes  SHORT TERM GOALS: Target date: 11/16/2022      Patient will be independent in home exercise program to improve strength/mobility for better functional independence with ADLs. Baseline: 3/27: given 5/15; in progress  7/15: compliant  Goal status: MET    LONG TERM GOALS: Target date: 02/06/2023    Patient will increase FOTO score to equal to or greater than  55   to demonstrate statistically significant improvement in mobility and quality of life.  Baseline: 45 5/15: 53% 7/15: 50% Goal status: Partially Met   2.  Patient will reduce Neck Disability Index score to <10% to demonstrate minimal disability with ADL's  including improved sleeping tolerance, sitting tolerance, etc for better mobility at home and work. Baseline: 42% 5/15: 24% 7/15: 20%  Goal status: Partially Met  3.  Patient will improve cervical ROM by 20 degrees flexion, sidebending, and rotation for improved quality of life and ability to drive.  Baseline: 3/27: see above 5/15: see above 7/15: see above Goal status: Partially Met  4.  Patient will return to full work and recreational activities without compensation or pain increase of >3/10.  Baseline: 3/27: unable to perform 5/15: returned to work, beginning recreation of sewing but needs to take breaks. 7/15: 3/10 pain with quilting and bird watching  Goal status: Partially Met    PLAN:  PT FREQUENCY: 2x/week  PT DURATION: 8 weeks  PLANNED INTERVENTIONS: Therapeutic exercises, Therapeutic activity, Neuromuscular re-education, Balance training, Gait training, Patient/Family education, Self Care, Joint mobilization, Stair training, Vestibular training, Canalith repositioning, Dry Needling, Spinal mobilization, Cryotherapy, Moist heat, scar mobilization, Traction, Ultrasound, Manual therapy, and Re-evaluation  PLAN FOR NEXT SESSION: upper trap release,  postural strengthening.   Precious Bard PT  Physical Therapist- [place]  [facility]  01/25/2023, 8:38 AM

## 2023-01-31 ENCOUNTER — Other Ambulatory Visit: Payer: Self-pay

## 2023-02-01 ENCOUNTER — Ambulatory Visit: Payer: Commercial Managed Care - PPO | Attending: Internal Medicine

## 2023-02-01 DIAGNOSIS — R2689 Other abnormalities of gait and mobility: Secondary | ICD-10-CM | POA: Diagnosis not present

## 2023-02-01 DIAGNOSIS — R293 Abnormal posture: Secondary | ICD-10-CM | POA: Diagnosis not present

## 2023-02-01 DIAGNOSIS — M542 Cervicalgia: Secondary | ICD-10-CM | POA: Insufficient documentation

## 2023-02-01 DIAGNOSIS — M6281 Muscle weakness (generalized): Secondary | ICD-10-CM | POA: Diagnosis not present

## 2023-02-01 NOTE — Therapy (Signed)
OUTPATIENT PHYSICAL THERAPY CERVICAL TREATMENT   Patient Name: Lori Morales MRN: [id number] DOB:31-Jan-1961, 62 y.o., female Today's Date: 02/01/2023  END OF SESSION:  PT End of Session - 02/01/23 0723     Visit Number 25    Number of Visits 34    Date for PT Re-Evaluation 02/06/23    Authorization Type [employer] employee [employer]    Authorization Time Period 08/24/22-10/19/22    Progress Note Due on Visit 10    PT Start Time 0719    PT Stop Time 0800    PT Time Calculation (min) 41 min    Activity Tolerance Patient tolerated treatment well;Patient limited by pain    Behavior During Therapy [facility] for tasks assessed/performed               Past Medical History:  Diagnosis Date   Arthritis    CAD (coronary artery disease), native coronary artery    mild non calcified plaque in mid LAD and prox LCx on coronary CTA 01/2019   Depression    GERD (gastroesophageal reflux disease)    Headache    migraines   Hx of squamous cell carcinoma 07/11/2016   Right distal dorsum lat forearm    Hypertension    IBS (irritable bowel syndrome)    Palpitations    PFO (patent foramen ovale)    small by cardiac CTA 01/2019   PONV (postoperative nausea and vomiting)    Restless legs syndrome    Squamous cell carcinoma of skin 12/202/2017   right distal dorsum lateral forearm   Past Surgical History:  Procedure Laterality Date   ABDOMINOPLASTY     BACK SURGERY     1 laminectomy, 1 fusion   COLONOSCOPY     COLONOSCOPY WITH PROPOFOL N/A 01/17/2017   Procedure: COLONOSCOPY WITH PROPOFOL;  Surgeon: Christena Deem, MD;  Location: [facility] ENDOSCOPY;  Service: Endoscopy;  Laterality: N/A;   COLONOSCOPY WITH PROPOFOL N/A 10/07/2022   Procedure: COLONOSCOPY WITH PROPOFOL;  Surgeon: [name], MD;  Location: [facility];  Service: Endoscopy;  Laterality: N/A;   WISDOM TOOTH EXTRACTION     Patient Active Problem List   Diagnosis Date Noted   Personal history of colonic polyps 10/07/2022   PFO  (patent foramen ovale)    CAD (coronary artery disease), native coronary artery    Acute CVA (cerebrovascular accident) (HCC) 06/15/2018   Depression 10/16/2017   Migraine 10/16/2017   Hyperglycemia, unspecified 07/10/2017   Hyperglycemia 07/10/2017   H/O dizziness 08/02/2016   Cervical disc disorder with radiculopathy of cervical region 04/12/2016   Nonallopathic lesion of thoracic region 04/12/2016   Nonallopathic lesion of rib cage 04/12/2016   Facet arthritis of cervical region 03/21/2016   Hyperlipidemia 01/26/2015   Labral tear of shoulder, right, subsequent encounter 12/04/2014   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   Impingement syndrome of right shoulder 05/28/2014   Right supraspinatus tenosynovitis 05/28/2014   Arthritis pain 04/03/2014   Fatigue 04/03/2014   Cervical intraepithelial neoplasia grade 1 06/20/2012   IBS (irritable bowel syndrome) 06/20/2012   Lumbar disc disease 06/20/2012    PCP: Lynnea Ferrier MD  REFERRING PROVIDER: Brown,Christopher R MD  REFERRING DIAG: cervical radiculopathy  THERAPY DIAG:  Cervicalgia  Abnormal posture  Other abnormalities of gait and mobility  Muscle weakness (generalized)  Rationale for Evaluation and Treatment: Rehabilitation  ONSET DATE: 06/14/22  SUBJECTIVE:  SUBJECTIVE STATEMENT:  Pt notes that she feels ok, noting that this is likely the way things will be from here on out.  Pt still noting 5/10 as her worst pain, but typically remains at a 3/10 pain level, mostly in the R UT region.  Hand dominance: Right  PERTINENT HISTORY:  Patient is returning to PT for s/p cervical fusion C5-7 with bilateral foraminotomies at C6-7 on 06/14/22. Patient has a history of C6-7 arthroplasty and has been treated by this clinician before. PMH  includes anxiety, CAD, depression, arthritis of cervical region, gastritis, GERD, cancer, stroke, HLD, HTN, IB, lumbar disc disease (fusion x2 ), migraine, numbness of L hand, headaches, and restless leg syndrome.  Patient had posterior cervical fusion from C5-C7 and bilateral foraminotomies at C6-7 on 06/14/22; Only restriction is no lifting >15 lb  PAIN:  Are you having pain? Yes: NPRS scale: 3/10 Pain location: lower cervical and across traps Pain description: aching Aggravating factors: positioning Relieving factors: positioning, heat  PRECAUTIONS: Cervical  WEIGHT BEARING RESTRICTIONS:  no lifting>15 lb   FALLS:  Has patient fallen in last 6 months? No  LIVING ENVIRONMENT: Lives with: lives with their spouse Lives in: House/apartment Stairs: Yes: External:   steps;     OCCUPATION: Chiropodist   PLOF: Independent  PATIENT GOALS: to have more range of motion  NEXT MD VISIT: July 3rd, 2024  OBJECTIVE:   DIAGNOSTIC FINDINGS:  IMPRESSION: 1. Anterior cervical fusion at C6-7. Susceptibility artifact partially obscures evaluation of the neural foramina and central canal. No significant right foraminal stenosis. Left uncovertebral degenerative changes with moderate left foraminal stenosis. 2. At C2-3 there is a minimal broad-based disc bulge. Moderate right facet arthropathy. Mild right foraminal stenosis. 3. At C3-4 there is a minimal broad-based disc bulge. Moderate right facet arthropathy. Mild left foraminal stenosis. 4. At C4-5 there is a minimal broad-based disc bulge. Mild right facet arthropathy. Moderate left foraminal stenosis. 5. At C7-T1 there is a minimal broad-based disc bulge. Mild right and moderate left foraminal stenosis.    IMPRESSION: 1. Anterior cervical fusion at C6-7. Susceptibility artifact partially obscures evaluation of the neural foramina and central canal. No significant right foraminal stenosis. Left uncovertebral degenerative  changes with moderate left foraminal stenosis. 2. At C2-3 there is a minimal broad-based disc bulge. Moderate right facet arthropathy. Mild right foraminal stenosis. 3. At C3-4 there is a minimal broad-based disc bulge. Moderate right facet arthropathy. Mild left foraminal stenosis. 4. At C4-5 there is a minimal broad-based disc bulge. Mild right facet arthropathy. Moderate left foraminal stenosis. 5. At C7-T1 there is a minimal broad-based disc bulge. Mild right and moderate left foraminal stenosis.  PATIENT SURVEYS:  NDI 42% FOTO 45  COGNITION: Overall cognitive status: Within functional limits for tasks assessed  SENSATION: Light touch: WFL; occasional R hand numbness with sleep   POSTURE: rounded shoulders  PALPATION: Hardware present, tenderness at C5-6, C6-7.    CERVICAL ROM:   Active ROM A/PROM (deg) eval PN 5/15: RECERT 7/15 D/C 9/4  Flexion 24 39 40 35  Extension 25 30 31 23   Right lateral flexion 9 20 19 15   Left lateral flexion 19 21 21 14   Right rotation 18 30 35 36  Left rotation 19 31 39 39   (Blank rows = not tested)  UPPER EXTREMITY ROM:  Active ROM Right eval Left eval  Shoulder flexion [facility]  Shoulder extension    Shoulder abduction 170 140   (Blank rows = not tested)  UPPER EXTREMITY MMT:  MMT Right eval Left eval  Shoulder flexion 4+ 4+  Shoulder extension 4+ 4+  Shoulder abduction 4+ 4+  Shoulder adduction 4+ 4+  Shoulder extension 4+ 4+  Elbow flexion 4+ 4+  Elbow extension 4+ 4+   (Blank rows = not tested)    TODAY'S TREATMENT: DATE: 02/01/23   Manual Therapy:   STM with TP release to the R UT for pain modulation and increased tissue extensibility Palpation for tension   Trigger Point Dry Needling (TDN), unbilled Education performed with patient regarding potential benefit of TDN. Reviewed precautions and risks with patient. Reviewed special precautions/risks over lung fields which include pneumothorax. Reviewed signs  and symptoms of pneumothorax and advised pt to go to ER immediately if these symptoms develop advise them of dry needling treatment. Extensive time spent with pt to ensure full understanding of TDN risks. Pt provided verbal consent to treatment. TDN performed to  with 0.25 x 40 single needle placements with local twitch response (LTR). Pistoning technique utilized. Improved pain-free motion following intervention. R upper trap x 2 minutes     TherAct:  Goal assessment performed and noted below:   PATIENT EDUCATION:  Education details: goals, POC, HEP Person educated: Patient Education method: Explanation, Demonstration, Tactile cues, and Verbal cues Education comprehension: verbalized understanding, returned demonstration, verbal cues required, and tactile cues required  HOME EXERCISE PROGRAM: Access Code: [email or web address] URL: [email or web address] Date: 08/24/2022 Prepared by: Precious Bard  Exercises - Supine Cervical Retraction with Towel  - 1 x daily - 7 x weekly - 2 sets - 10 reps - 5 hold - Seated Scapular Retraction  - 1 x daily - 7 x weekly - 2 sets - 10 reps - 5 hold - Supine Cervical Rotation AROM on Pillow  - 1 x daily - 7 x weekly - 2 sets - 10 reps - 5 hold - Neck Sidebending  - 1 x daily - 7 x weekly - 2 sets - 10 reps - 5 hold - Supine Cervical Flexion Extension on Pillow  - 1 x daily - 7 x weekly - 2 sets - 10 reps - 5 hold  ASSESSMENT:  CLINICAL IMPRESSION:  Pt responded well to the manual treatment today and has made considerable progress towards goals.  Pt does still have mild pain throughout the day, but is overall doing well and is happy with current progress.  Pt has great understanding of the healing process and is happy with the improvements made thus far post surgical.  Pt understands HEP and is independent with it and will continue to perform in order to improve overall functional mobility and reduce pain.  Pt is discharged at this time.    OBJECTIVE  IMPAIRMENTS: decreased activity tolerance, decreased endurance, decreased ROM, decreased strength, hypomobility, increased muscle spasms, impaired flexibility, improper body mechanics, postural dysfunction, and pain.   ACTIVITY LIMITATIONS: carrying, lifting, sitting, standing, reach over head, and work  PARTICIPATION LIMITATIONS: meal prep, cleaning, laundry, driving, shopping, community activity, occupation, and yard work  PERSONAL FACTORS: Past/current experiences, Catering manager, Sex, Time since onset of injury/illness/exacerbation, and 3+ comorbidities: anxiety, CAD, depression, arthritis of cervical region, gastritis, GERD, cancer, stroke, HLD, HTN, IB, lumbar disc disease (fusion x2 ), migraine, numbness of L hand, headaches, and restless leg syndrome  are also affecting patient's functional outcome.   REHAB POTENTIAL: Fair    CLINICAL DECISION MAKING: Evolving/moderate complexity  EVALUATION COMPLEXITY: Moderate   GOALS: Goals reviewed with patient? Yes  SHORT TERM GOALS: Target date:  11/16/2022  Patient will be independent in home exercise program to improve strength/mobility for better functional independence with ADLs. Baseline: 3/27: given 5/15; in progress  7/15: compliant  Goal status: MET    LONG TERM GOALS: Target date: 02/06/2023  Patient will increase FOTO score to equal to or greater than  55   to demonstrate statistically significant improvement in mobility and quality of life.  Baseline: 45 5/15: 53% 7/15: 50%; 9/4: 48% Goal status: Partially Met   2.  Patient will reduce Neck Disability Index score to <10% to demonstrate minimal disability with ADL's including improved sleeping tolerance, sitting tolerance, etc for better mobility at home and work. Baseline: 42% 5/15: 24% 7/15: 20%; 9/4: 32% Goal status: Partially Met  3.  Patient will improve cervical ROM by 20 degrees flexion, sidebending, and rotation for improved quality of life and ability to drive.  Baseline:  3/27: see above 5/15: see above 7/15: see above; 9/4: see above Goal status: Partially Met  4.  Patient will return to full work and recreational activities without compensation or pain increase of >3/10.  Baseline: 3/27: unable to perform 5/15: returned to work, beginning recreation of sewing but needs to take breaks. 7/15: 3/10 pain with quilting and bird watching; 02/01/23: 3/10 pain with working and other hobbies listed previously Goal status: Partially Met    PLAN:  PT FREQUENCY: 2x/week  PT DURATION: 8 weeks  PLANNED INTERVENTIONS: Therapeutic exercises, Therapeutic activity, Neuromuscular re-education, Balance training, Gait training, Patient/Family education, Self Care, Joint mobilization, Stair training, Vestibular training, Canalith repositioning, Dry Needling, Spinal mobilization, Cryotherapy, Moist heat, scar mobilization, Traction, Ultrasound, Manual therapy, and Re-evaluation  PLAN FOR NEXT SESSION: d/c at this time.  Phineas Real PT  Physical Therapist- [place]  [facility]  02/01/2023, 7:24 AM

## 2023-02-03 ENCOUNTER — Other Ambulatory Visit: Payer: Self-pay

## 2023-02-03 MED ORDER — PRAMIPEXOLE DIHYDROCHLORIDE 0.25 MG PO TABS
0.2500 mg | ORAL_TABLET | Freq: Every evening | ORAL | 3 refills | Status: DC
  Filled 2023-02-03: qty 135, 90d supply, fill #0

## 2023-02-03 MED ORDER — PRAVASTATIN SODIUM 40 MG PO TABS
40.0000 mg | ORAL_TABLET | Freq: Every day | ORAL | 3 refills | Status: AC
  Filled 2023-02-03: qty 90, 90d supply, fill #0

## 2023-02-06 ENCOUNTER — Ambulatory Visit: Payer: Commercial Managed Care - PPO

## 2023-02-06 ENCOUNTER — Other Ambulatory Visit: Payer: Self-pay

## 2023-02-06 MED ORDER — AMITRIPTYLINE HCL 10 MG PO TABS
10.0000 mg | ORAL_TABLET | Freq: Every day | ORAL | 0 refills | Status: DC
  Filled 2023-02-06: qty 30, 30d supply, fill #0

## 2023-02-06 MED ORDER — TIZANIDINE HCL 2 MG PO TABS
2.0000 mg | ORAL_TABLET | Freq: Three times a day (TID) | ORAL | 1 refills | Status: DC | PRN
  Filled 2023-02-06: qty 60, 20d supply, fill #0
  Filled 2023-09-10: qty 60, 20d supply, fill #1

## 2023-02-06 MED ORDER — ZOLPIDEM TARTRATE ER 6.25 MG PO TBCR
6.2500 mg | EXTENDED_RELEASE_TABLET | Freq: Every evening | ORAL | 5 refills | Status: DC | PRN
  Filled 2023-02-06 – 2023-03-22 (×4): qty 30, 30d supply, fill #0
  Filled 2023-04-25: qty 30, 30d supply, fill #1
  Filled 2023-05-21 – 2023-05-22 (×2): qty 30, 30d supply, fill #2
  Filled 2023-07-02: qty 30, 30d supply, fill #3

## 2023-02-06 MED ORDER — LOSARTAN POTASSIUM 100 MG PO TABS
100.0000 mg | ORAL_TABLET | Freq: Every day | ORAL | 3 refills | Status: DC
  Filled 2023-02-06: qty 90, 90d supply, fill #0
  Filled 2023-05-07 – 2023-05-19 (×2): qty 90, 90d supply, fill #1
  Filled 2023-08-07: qty 90, 90d supply, fill #2

## 2023-02-06 MED ORDER — PANTOPRAZOLE SODIUM 40 MG PO TBEC
DELAYED_RELEASE_TABLET | ORAL | 3 refills | Status: AC
  Filled 2023-02-06: qty 90, 90d supply, fill #0
  Filled 2023-05-07 – 2023-05-19 (×2): qty 90, 90d supply, fill #1

## 2023-02-08 ENCOUNTER — Ambulatory Visit: Payer: Commercial Managed Care - PPO

## 2023-02-09 ENCOUNTER — Other Ambulatory Visit: Payer: Self-pay | Admitting: Obstetrics and Gynecology

## 2023-02-09 ENCOUNTER — Other Ambulatory Visit: Payer: Self-pay

## 2023-02-09 DIAGNOSIS — Z13820 Encounter for screening for osteoporosis: Secondary | ICD-10-CM

## 2023-02-09 MED ORDER — PREDNISONE 10 MG PO TABS
ORAL_TABLET | ORAL | 0 refills | Status: DC
  Filled 2023-02-09: qty 20, 8d supply, fill #0

## 2023-02-10 ENCOUNTER — Other Ambulatory Visit: Payer: Self-pay

## 2023-02-13 ENCOUNTER — Ambulatory Visit: Payer: Commercial Managed Care - PPO

## 2023-02-13 ENCOUNTER — Other Ambulatory Visit: Payer: Self-pay

## 2023-02-14 ENCOUNTER — Other Ambulatory Visit: Payer: Self-pay

## 2023-02-15 ENCOUNTER — Ambulatory Visit: Payer: Commercial Managed Care - PPO

## 2023-02-20 ENCOUNTER — Ambulatory Visit: Payer: Commercial Managed Care - PPO

## 2023-02-22 ENCOUNTER — Ambulatory Visit: Payer: Commercial Managed Care - PPO

## 2023-02-22 ENCOUNTER — Other Ambulatory Visit: Payer: Self-pay

## 2023-02-26 ENCOUNTER — Other Ambulatory Visit: Payer: Self-pay

## 2023-02-28 DIAGNOSIS — R7303 Prediabetes: Secondary | ICD-10-CM | POA: Diagnosis not present

## 2023-02-28 DIAGNOSIS — E7849 Other hyperlipidemia: Secondary | ICD-10-CM | POA: Diagnosis not present

## 2023-03-09 ENCOUNTER — Other Ambulatory Visit: Payer: Self-pay

## 2023-03-09 DIAGNOSIS — Z8673 Personal history of transient ischemic attack (TIA), and cerebral infarction without residual deficits: Secondary | ICD-10-CM | POA: Diagnosis not present

## 2023-03-09 DIAGNOSIS — F419 Anxiety disorder, unspecified: Secondary | ICD-10-CM | POA: Diagnosis not present

## 2023-03-09 DIAGNOSIS — E7849 Other hyperlipidemia: Secondary | ICD-10-CM | POA: Diagnosis not present

## 2023-03-09 DIAGNOSIS — I10 Essential (primary) hypertension: Secondary | ICD-10-CM | POA: Diagnosis not present

## 2023-03-09 DIAGNOSIS — M519 Unspecified thoracic, thoracolumbar and lumbosacral intervertebral disc disorder: Secondary | ICD-10-CM | POA: Diagnosis not present

## 2023-03-09 DIAGNOSIS — F3289 Other specified depressive episodes: Secondary | ICD-10-CM | POA: Diagnosis not present

## 2023-03-09 DIAGNOSIS — R7303 Prediabetes: Secondary | ICD-10-CM | POA: Diagnosis not present

## 2023-03-09 DIAGNOSIS — I2510 Atherosclerotic heart disease of native coronary artery without angina pectoris: Secondary | ICD-10-CM | POA: Diagnosis not present

## 2023-03-09 DIAGNOSIS — K589 Irritable bowel syndrome without diarrhea: Secondary | ICD-10-CM | POA: Diagnosis not present

## 2023-03-09 DIAGNOSIS — K219 Gastro-esophageal reflux disease without esophagitis: Secondary | ICD-10-CM | POA: Diagnosis not present

## 2023-03-09 MED ORDER — PRAMIPEXOLE DIHYDROCHLORIDE 0.25 MG PO TABS
0.2500 mg | ORAL_TABLET | Freq: Every day | ORAL | 3 refills | Status: DC
  Filled 2023-03-09 – 2023-03-14 (×2): qty 135, 90d supply, fill #0
  Filled 2023-06-08: qty 135, 90d supply, fill #1
  Filled 2023-09-10: qty 135, 90d supply, fill #2
  Filled 2023-12-17: qty 135, 90d supply, fill #3

## 2023-03-13 ENCOUNTER — Other Ambulatory Visit: Payer: Self-pay | Admitting: Internal Medicine

## 2023-03-13 ENCOUNTER — Other Ambulatory Visit: Payer: Self-pay

## 2023-03-13 DIAGNOSIS — I10 Essential (primary) hypertension: Secondary | ICD-10-CM

## 2023-03-13 DIAGNOSIS — M519 Unspecified thoracic, thoracolumbar and lumbosacral intervertebral disc disorder: Secondary | ICD-10-CM

## 2023-03-14 ENCOUNTER — Other Ambulatory Visit: Payer: Self-pay

## 2023-03-14 ENCOUNTER — Ambulatory Visit
Admission: RE | Admit: 2023-03-14 | Discharge: 2023-03-14 | Disposition: A | Discharge status: Home / Self Care | Payer: Commercial Managed Care - PPO | Source: Ambulatory Visit | Attending: Internal Medicine | Admitting: Internal Medicine

## 2023-03-14 DIAGNOSIS — M519 Unspecified thoracic, thoracolumbar and lumbosacral intervertebral disc disorder: Secondary | ICD-10-CM | POA: Insufficient documentation

## 2023-03-14 DIAGNOSIS — I10 Essential (primary) hypertension: Secondary | ICD-10-CM | POA: Insufficient documentation

## 2023-03-14 DIAGNOSIS — M5126 Other intervertebral disc displacement, lumbar region: Secondary | ICD-10-CM | POA: Diagnosis not present

## 2023-03-14 DIAGNOSIS — Z981 Arthrodesis status: Secondary | ICD-10-CM | POA: Diagnosis not present

## 2023-03-14 DIAGNOSIS — M47816 Spondylosis without myelopathy or radiculopathy, lumbar region: Secondary | ICD-10-CM | POA: Diagnosis not present

## 2023-03-14 MED ORDER — HYDROCODONE-ACETAMINOPHEN 5-325 MG PO TABS
1.0000 | ORAL_TABLET | Freq: Four times a day (QID) | ORAL | 0 refills | Status: DC | PRN
  Filled 2023-03-14: qty 20, 5d supply, fill #0

## 2023-03-15 ENCOUNTER — Other Ambulatory Visit: Payer: Self-pay | Admitting: Obstetrics and Gynecology

## 2023-03-15 ENCOUNTER — Other Ambulatory Visit: Payer: Self-pay

## 2023-03-15 DIAGNOSIS — N94819 Vulvodynia, unspecified: Secondary | ICD-10-CM | POA: Diagnosis not present

## 2023-03-15 DIAGNOSIS — Z01419 Encounter for gynecological examination (general) (routine) without abnormal findings: Secondary | ICD-10-CM | POA: Diagnosis not present

## 2023-03-15 DIAGNOSIS — Z124 Encounter for screening for malignant neoplasm of cervix: Secondary | ICD-10-CM | POA: Diagnosis not present

## 2023-03-15 DIAGNOSIS — Z1231 Encounter for screening mammogram for malignant neoplasm of breast: Secondary | ICD-10-CM | POA: Diagnosis not present

## 2023-03-15 DIAGNOSIS — N814 Uterovaginal prolapse, unspecified: Secondary | ICD-10-CM | POA: Diagnosis not present

## 2023-03-15 DIAGNOSIS — Z13820 Encounter for screening for osteoporosis: Secondary | ICD-10-CM | POA: Diagnosis not present

## 2023-03-15 MED ORDER — AMITRIPTYLINE HCL 25 MG PO TABS
25.0000 mg | ORAL_TABLET | Freq: Every day | ORAL | 3 refills | Status: DC
  Filled 2023-03-15: qty 90, 90d supply, fill #0
  Filled 2023-06-08: qty 90, 90d supply, fill #1
  Filled 2023-10-15: qty 90, 90d supply, fill #2
  Filled 2024-01-25: qty 90, 90d supply, fill #3

## 2023-03-15 MED ORDER — ESTRADIOL 10 MCG VA TABS
10.0000 ug | ORAL_TABLET | VAGINAL | 4 refills | Status: DC
  Filled 2023-03-15: qty 24, 84d supply, fill #0

## 2023-03-16 ENCOUNTER — Other Ambulatory Visit: Payer: Self-pay

## 2023-03-16 MED ORDER — SUCRALFATE 1 G PO TABS
1.0000 g | ORAL_TABLET | Freq: Three times a day (TID) | ORAL | 7 refills | Status: AC
  Filled 2023-03-16: qty 28, 7d supply, fill #0
  Filled 2023-05-07 – 2023-05-19 (×2): qty 28, 7d supply, fill #1
  Filled 2023-07-16: qty 28, 7d supply, fill #2

## 2023-03-21 ENCOUNTER — Other Ambulatory Visit: Payer: Self-pay

## 2023-03-22 ENCOUNTER — Other Ambulatory Visit: Payer: Self-pay

## 2023-03-23 ENCOUNTER — Other Ambulatory Visit (HOSPITAL_BASED_OUTPATIENT_CLINIC_OR_DEPARTMENT_OTHER): Payer: Self-pay

## 2023-03-27 DIAGNOSIS — M722 Plantar fascial fibromatosis: Secondary | ICD-10-CM | POA: Diagnosis not present

## 2023-04-06 ENCOUNTER — Other Ambulatory Visit: Payer: Commercial Managed Care - PPO

## 2023-04-12 DIAGNOSIS — M5412 Radiculopathy, cervical region: Secondary | ICD-10-CM | POA: Diagnosis not present

## 2023-04-12 DIAGNOSIS — M4802 Spinal stenosis, cervical region: Secondary | ICD-10-CM | POA: Diagnosis not present

## 2023-04-12 DIAGNOSIS — Z981 Arthrodesis status: Secondary | ICD-10-CM | POA: Diagnosis not present

## 2023-04-25 ENCOUNTER — Other Ambulatory Visit: Payer: Self-pay

## 2023-05-08 ENCOUNTER — Other Ambulatory Visit: Payer: Self-pay

## 2023-05-09 DIAGNOSIS — H524 Presbyopia: Secondary | ICD-10-CM | POA: Diagnosis not present

## 2023-05-09 DIAGNOSIS — H5213 Myopia, bilateral: Secondary | ICD-10-CM | POA: Diagnosis not present

## 2023-05-09 DIAGNOSIS — H52223 Regular astigmatism, bilateral: Secondary | ICD-10-CM | POA: Diagnosis not present

## 2023-05-17 ENCOUNTER — Ambulatory Visit
Admission: RE | Admit: 2023-05-17 | Discharge: 2023-05-17 | Disposition: A | Discharge status: Home / Self Care | Payer: Commercial Managed Care - PPO | Source: Ambulatory Visit | Attending: Obstetrics and Gynecology | Admitting: Obstetrics and Gynecology

## 2023-05-17 DIAGNOSIS — Z780 Asymptomatic menopausal state: Secondary | ICD-10-CM | POA: Diagnosis not present

## 2023-05-17 DIAGNOSIS — Z13820 Encounter for screening for osteoporosis: Secondary | ICD-10-CM | POA: Diagnosis not present

## 2023-05-18 ENCOUNTER — Ambulatory Visit: Payer: Commercial Managed Care - PPO | Admitting: Dermatology

## 2023-05-18 DIAGNOSIS — B070 Plantar wart: Secondary | ICD-10-CM

## 2023-05-18 DIAGNOSIS — D2372 Other benign neoplasm of skin of left lower limb, including hip: Secondary | ICD-10-CM | POA: Diagnosis not present

## 2023-05-18 DIAGNOSIS — L821 Other seborrheic keratosis: Secondary | ICD-10-CM

## 2023-05-18 DIAGNOSIS — Z1283 Encounter for screening for malignant neoplasm of skin: Secondary | ICD-10-CM

## 2023-05-18 DIAGNOSIS — W908XXA Exposure to other nonionizing radiation, initial encounter: Secondary | ICD-10-CM | POA: Diagnosis not present

## 2023-05-18 DIAGNOSIS — L578 Other skin changes due to chronic exposure to nonionizing radiation: Secondary | ICD-10-CM

## 2023-05-18 DIAGNOSIS — L814 Other melanin hyperpigmentation: Secondary | ICD-10-CM

## 2023-05-18 DIAGNOSIS — D229 Melanocytic nevi, unspecified: Secondary | ICD-10-CM

## 2023-05-18 DIAGNOSIS — Z85858 Personal history of malignant neoplasm of other endocrine glands: Secondary | ICD-10-CM

## 2023-05-18 DIAGNOSIS — D1801 Hemangioma of skin and subcutaneous tissue: Secondary | ICD-10-CM | POA: Diagnosis not present

## 2023-05-18 DIAGNOSIS — L820 Inflamed seborrheic keratosis: Secondary | ICD-10-CM | POA: Diagnosis not present

## 2023-05-18 DIAGNOSIS — Z8589 Personal history of malignant neoplasm of other organs and systems: Secondary | ICD-10-CM

## 2023-05-18 NOTE — Patient Instructions (Addendum)

## 2023-05-18 NOTE — Progress Notes (Signed)
Follow-Up Visit   Subjective  Lori Morales is a 62 y.o. female who presents for the following: Skin Cancer Screening and Full Body Skin Exam, hx of SCC, check spot L ant thigh, itches prn  The patient presents for Total-Body Skin Exam (TBSE) for skin cancer screening and mole check. The patient has spots, moles and lesions to be evaluated, some may be new or changing and the patient may have concern these could be cancer.  The following portions of the chart were reviewed this encounter and updated as appropriate: medications, allergies, medical history  Review of Systems:  No other skin or systemic complaints except as noted in HPI or Assessment and Plan.  Objective  Well appearing patient in no apparent distress; mood and affect are within normal limits.  A full examination was performed including scalp, head, eyes, ears, nose, lips, neck, chest, axillae, abdomen, back, buttocks, bilateral upper extremities, bilateral lower extremities, hands, feet, fingers, toes, fingernails, and toenails. All findings within normal limits unless otherwise noted below.   Relevant physical exam findings are noted in the Assessment and Plan.  R plantar great toe x 1 Verrucous papules -- Discussed viral etiology and contagion.  L ant thigh x 1 Stuck on waxy paps with erythema   Assessment & Plan   SKIN CANCER SCREENING PERFORMED TODAY.  ACTINIC DAMAGE - Chronic condition, secondary to cumulative UV/sun exposure - diffuse scaly erythematous macules with underlying dyspigmentation - Recommend daily broad spectrum sunscreen SPF 30+ to sun-exposed areas, reapply every 2 hours as needed.  - Staying in the shade or wearing long sleeves, sun glasses (UVA+UVB protection) and wide brim hats (4-inch brim around the entire circumference of the hat) are also recommended for sun protection.  - Call for new or changing lesions.  LENTIGINES, SEBORRHEIC KERATOSES, HEMANGIOMAS - Benign normal skin lesions -  Benign-appearing - Call for any changes  MELANOCYTIC NEVI - Tan-brown and/or pink-flesh-colored symmetric macules and papules - Benign appearing on exam today - Observation - Call clinic for new or changing moles - Recommend daily use of broad spectrum spf 30+ sunscreen to sun-exposed areas.   HISTORY OF SQUAMOUS CELL CARCINOMA OF THE SKIN - No evidence of recurrence today - No lymphadenopathy - Recommend regular full body skin exams - Recommend daily broad spectrum sunscreen SPF 30+ to sun-exposed areas, reapply every 2 hours as needed.  - Call if any new or changing lesions are noted between office visits - R distal dorsum lat forearm   BLUE NEVUS L ant medial ankle Present for many years without change Exam: blue gray macule 0.3cm L ant medial ankle Treatment Plan: Benign-appearing.  Observation.  Call clinic for new or changing moles.  Recommend daily use of broad spectrum spf 30+ sunscreen to sun-exposed areas.   PLANTAR WART R plantar great toe x 1 Viral Wart (HPV) Counseling  Discussed viral / HPV (Human Papilloma Virus) etiology and risk of spread /infectivity to other areas of body as well as to other people.  Multiple treatments and methods may be required to clear warts and it is possible treatment may not be successful.  Treatment risks include discoloration; scarring and there is still potential for wart recurrence. Destruction of lesion - R plantar great toe x 1 Complexity: simple   Destruction method: cryotherapy   Informed consent: discussed and consent obtained   Timeout:  patient name, date of birth, surgical site, and procedure verified Lesion destroyed using liquid nitrogen: Yes   Region frozen until ice  ball extended beyond lesion: Yes   Outcome: patient tolerated procedure well with no complications   Post-procedure details: wound care instructions given   INFLAMED SEBORRHEIC KERATOSIS L ant thigh x 1 Symptomatic, irritating, patient would like  treated. Destruction of lesion - L ant thigh x 1 Complexity: simple   Destruction method: cryotherapy   Informed consent: discussed and consent obtained   Timeout:  patient name, date of birth, surgical site, and procedure verified Lesion destroyed using liquid nitrogen: Yes   Region frozen until ice ball extended beyond lesion: Yes   Outcome: patient tolerated procedure well with no complications   Post-procedure details: wound care instructions given   Return in about 1 year (around 05/17/2024) for TBSE, Hx of SCC.  I, Ardis Rowan, RMA, am acting as scribe for Armida Sans, MD .   Documentation: I have reviewed the above documentation for accuracy and completeness, and I agree with the above.  Armida Sans, MD

## 2023-05-19 ENCOUNTER — Other Ambulatory Visit: Payer: Self-pay

## 2023-05-19 MED ORDER — CEPHALEXIN 250 MG PO CAPS
250.0000 mg | ORAL_CAPSULE | Freq: Four times a day (QID) | ORAL | 0 refills | Status: AC
  Filled 2023-05-19: qty 28, 7d supply, fill #0

## 2023-05-21 ENCOUNTER — Other Ambulatory Visit: Payer: Self-pay

## 2023-05-22 ENCOUNTER — Ambulatory Visit
Admission: RE | Admit: 2023-05-22 | Discharge: 2023-05-22 | Disposition: A | Discharge status: Home / Self Care | Payer: Commercial Managed Care - PPO | Source: Ambulatory Visit | Attending: Obstetrics and Gynecology | Admitting: Obstetrics and Gynecology

## 2023-05-22 ENCOUNTER — Other Ambulatory Visit: Payer: Self-pay

## 2023-05-22 DIAGNOSIS — Z1231 Encounter for screening mammogram for malignant neoplasm of breast: Secondary | ICD-10-CM | POA: Insufficient documentation

## 2023-05-22 MED ORDER — PRAVASTATIN SODIUM 40 MG PO TABS
40.0000 mg | ORAL_TABLET | Freq: Every day | ORAL | 3 refills | Status: DC
  Filled 2023-05-22: qty 90, 90d supply, fill #0
  Filled 2023-08-07: qty 90, 90d supply, fill #1
  Filled 2023-10-29: qty 90, 90d supply, fill #2
  Filled 2024-01-25: qty 90, 90d supply, fill #3

## 2023-05-28 ENCOUNTER — Encounter: Payer: Self-pay | Admitting: Dermatology

## 2023-06-09 ENCOUNTER — Other Ambulatory Visit: Payer: Self-pay

## 2023-06-12 ENCOUNTER — Other Ambulatory Visit: Payer: Self-pay

## 2023-06-12 MED ORDER — AZITHROMYCIN 250 MG PO TABS
ORAL_TABLET | ORAL | 0 refills | Status: DC
  Filled 2023-06-12: qty 6, 5d supply, fill #0

## 2023-06-22 ENCOUNTER — Other Ambulatory Visit: Payer: Self-pay

## 2023-06-22 MED ORDER — CHLORHEXIDINE GLUCONATE 0.12 % MT SOLN
Freq: Two times a day (BID) | OROMUCOSAL | 0 refills | Status: DC
  Filled 2023-06-22: qty 473, 16d supply, fill #0

## 2023-07-02 ENCOUNTER — Other Ambulatory Visit: Payer: Self-pay

## 2023-07-16 ENCOUNTER — Other Ambulatory Visit: Payer: Self-pay

## 2023-07-26 ENCOUNTER — Other Ambulatory Visit: Payer: Self-pay

## 2023-07-26 DIAGNOSIS — K219 Gastro-esophageal reflux disease without esophagitis: Secondary | ICD-10-CM | POA: Diagnosis not present

## 2023-07-26 DIAGNOSIS — I2510 Atherosclerotic heart disease of native coronary artery without angina pectoris: Secondary | ICD-10-CM | POA: Diagnosis not present

## 2023-07-26 DIAGNOSIS — I10 Essential (primary) hypertension: Secondary | ICD-10-CM | POA: Diagnosis not present

## 2023-07-26 DIAGNOSIS — R110 Nausea: Secondary | ICD-10-CM | POA: Diagnosis not present

## 2023-07-26 MED ORDER — SUCRALFATE 1 G PO TABS
1.0000 g | ORAL_TABLET | Freq: Four times a day (QID) | ORAL | 6 refills | Status: AC
  Filled 2023-07-26: qty 60, 15d supply, fill #0
  Filled 2023-08-30: qty 60, 15d supply, fill #1
  Filled 2024-01-25: qty 60, 15d supply, fill #2
  Filled 2024-04-28: qty 60, 15d supply, fill #3

## 2023-07-26 MED ORDER — PANTOPRAZOLE SODIUM 40 MG PO TBEC
40.0000 mg | DELAYED_RELEASE_TABLET | Freq: Two times a day (BID) | ORAL | 3 refills | Status: AC
  Filled 2023-07-26: qty 180, 90d supply, fill #0
  Filled 2023-12-17: qty 180, 90d supply, fill #1
  Filled 2024-04-28: qty 180, 90d supply, fill #2

## 2023-07-31 ENCOUNTER — Other Ambulatory Visit: Payer: Self-pay

## 2023-07-31 MED ORDER — HYDROCODONE-ACETAMINOPHEN 5-325 MG PO TABS
1.0000 | ORAL_TABLET | Freq: Four times a day (QID) | ORAL | 0 refills | Status: DC | PRN
Start: 2023-07-31 — End: 2023-08-24
  Filled 2023-07-31: qty 20, 5d supply, fill #0

## 2023-07-31 MED ORDER — PREDNISONE 20 MG PO TABS
ORAL_TABLET | ORAL | 0 refills | Status: DC
  Filled 2023-07-31: qty 15, 10d supply, fill #0

## 2023-08-01 ENCOUNTER — Telehealth: Payer: Self-pay

## 2023-08-01 ENCOUNTER — Other Ambulatory Visit: Payer: Self-pay

## 2023-08-01 ENCOUNTER — Telehealth: Payer: Self-pay | Admitting: Gastroenterology

## 2023-08-01 NOTE — Telephone Encounter (Signed)
 error

## 2023-08-01 NOTE — Telephone Encounter (Signed)
 We received a referral from patient for GERD. Patient states she is also having some nausea. Patient was last seen at [facility] in 08/2022 and had colonoscopy done in May of 2024. Patient also stated that they will be closing and they will be moving to [facility] but would still like to be seen under the [facility] system for insurance. Patient is requesting to be seen with Dr. Adela Lank. Records are being sent over for review.

## 2023-08-07 ENCOUNTER — Other Ambulatory Visit: Payer: Self-pay

## 2023-08-08 ENCOUNTER — Other Ambulatory Visit: Payer: Self-pay

## 2023-08-08 MED ORDER — ZOLPIDEM TARTRATE ER 6.25 MG PO TBCR
6.2500 mg | EXTENDED_RELEASE_TABLET | Freq: Every evening | ORAL | 5 refills | Status: DC | PRN
  Filled 2023-08-08: qty 30, 30d supply, fill #0

## 2023-08-24 ENCOUNTER — Encounter: Payer: Self-pay | Admitting: Cardiovascular Disease

## 2023-08-24 ENCOUNTER — Other Ambulatory Visit: Payer: Self-pay

## 2023-08-24 ENCOUNTER — Ambulatory Visit: Attending: Cardiovascular Disease | Admitting: Cardiovascular Disease

## 2023-08-24 VITALS — BP 150/80 | HR 69 | Ht 65.0 in | Wt 178.2 lb

## 2023-08-24 DIAGNOSIS — I10 Essential (primary) hypertension: Secondary | ICD-10-CM | POA: Diagnosis not present

## 2023-08-24 DIAGNOSIS — I2510 Atherosclerotic heart disease of native coronary artery without angina pectoris: Secondary | ICD-10-CM

## 2023-08-24 DIAGNOSIS — I25118 Atherosclerotic heart disease of native coronary artery with other forms of angina pectoris: Secondary | ICD-10-CM

## 2023-08-24 DIAGNOSIS — R072 Precordial pain: Secondary | ICD-10-CM

## 2023-08-24 DIAGNOSIS — E785 Hyperlipidemia, unspecified: Secondary | ICD-10-CM

## 2023-08-24 DIAGNOSIS — R0602 Shortness of breath: Secondary | ICD-10-CM | POA: Diagnosis not present

## 2023-08-24 MED ORDER — METOPROLOL TARTRATE 100 MG PO TABS
ORAL_TABLET | ORAL | 0 refills | Status: AC
  Filled 2023-08-24: qty 1, 1d supply, fill #0

## 2023-08-24 NOTE — Progress Notes (Unsigned)
 Cardiology Office Note   Date:  08/25/2023   ID:  [name], [name] [birth year], MRN [id number]  PCP:  Lynnea Ferrier, MD  Cardiologist:   Lorine Bears, MD   Chief Complaint  Patient presents with   Follow-up    Some chest pressure a few weeks ago       History of Present Illness: Lori Morales is a 63 y.o. female who is self-referred for evaluation of chest pain.   She has chronic medical conditions that include essential hypertension, mild hyperlipidemia, migraine and strong family history of vascular disease. She was hospitalized at [facility] in January 2020 with right arm weakness, numbness and gait instability.  MRI showed acute left thalamic stroke.  She was seen by neurology who felt that she had small vessel CVA.  She was started on aspirin and statin.  She was switched from Toprol to losartan due to bradycardia.  Echocardiogram was normal.  Hypercoagulable work-up as an outpatient was negative.  She was seen by me at that time in 2020 and had outpatient monitor done that showed no evidence of atrial fibrillation.  She subsequently had episode of atypical chest pain and underwent cardiac CTA in September 2020 which showed mild noncalcified plaque in the mid LAD and proximal left circumflex.  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  CT FFR was negative.  She is here for evaluation of intermittent episodes of chest pain which started few weeks ago.  She had 2 episodes at home of substernal chest tightness with exertion that lasted only few minutes.  They resolved without intervention.  She had a third episode while she was at work when she was under significant stress and the episode lasted for about 15 minutes and was described as heavy tightness sensation.  She hooked herself to an EKG machine and noted some ST depression but this was not recorded.  She went on a trip to [place] recently and noted increased exertional dyspnea.  Past Medical  History:  Diagnosis Date   Arthritis    CAD (coronary artery disease), native coronary artery    mild non calcified plaque in mid LAD and prox LCx on coronary CTA 01/2019   Depression    GERD (gastroesophageal reflux disease)    Headache    migraines   Hx of squamous cell carcinoma 07/11/2016   Right distal dorsum lat forearm    Hypertension    IBS (irritable bowel syndrome)    Palpitations    PFO (patent foramen ovale)    small by cardiac CTA 01/2019   PONV (postoperative nausea and vomiting)    Restless legs syndrome    Squamous cell carcinoma of skin 12/202/2017   right distal dorsum lateral forearm    Past Surgical History:  Procedure Laterality Date   ABDOMINOPLASTY     BACK SURGERY     1 laminectomy, 1 fusion   COLONOSCOPY     COLONOSCOPY WITH PROPOFOL N/A 01/17/2017   Procedure: COLONOSCOPY WITH PROPOFOL;  Surgeon: Christena Deem, MD;  Location[facility] ENDOSCOPY;  Service: Endoscopy;  Laterality: N/A;   COLONOSCOPY WITH PROPOFOL N/A 10/07/2022   Procedure: COLONOSCOPY WITH PROPOFOL;  Surgeon: [name], MD;  Location: [facility];  Service: Endoscopy;  Laterality: N/A;   WISDOM TOOTH EXTRACTION       Current Outpatient Medications  Medication Sig Dispense Refill   amitriptyline (ELAVIL) 25 MG tablet Take 1 tablet (25 mg total) by  mouth at bedtime. 90 tablet 3   celecoxib (CELEBREX) 200 MG capsule Take 1 capsule (200 mg total) by mouth 2 (two) times daily as needed for Pain 180 capsule 3   metoprolol tartrate (LOPRESSOR) 100 MG tablet Take one tablet two hours prior to the test 1 tablet 0   amitriptyline (ELAVIL) 10 MG tablet Take 10 mg by mouth at bedtime.      aspirin EC 81 MG tablet Take 81 mg by mouth daily.      cephALEXin (KEFLEX) 250 MG capsule Take 1 capsule (250 mg total) by mouth 4 (four) times daily (every 6 hours) until finished. 28 capsule 0   chlorhexidine (PERIDEX) 0.12 % solution Rinse with mouthwash for 30 seconds after brushing and flossing  2 (two) times daily. Do not rinse afterwards. 473 mL 0   cyclobenzaprine (FLEXERIL) 10 MG tablet Take 1 tablet (10 mg total) by mouth at bedtime as needed. 20 tablet 1   Estradiol 10 MCG TABS vaginal tablet Place 1 tablet (10 mcg total) vaginally twice a week 24 tablet 4   Estradiol 10 MCG TABS vaginal tablet Place 1 tablet (10 mcg total) vaginally 2 (two) times a week. 24 tablet 4   HYDROcodone-acetaminophen (NORCO) 10-325 MG tablet Take 0.5 tablets by mouth every 6 (six) hours as needed for Pain for up to 5 days 10 tablet 0   losartan (COZAAR) 100 MG tablet Take 1 tablet (100 mg total) by mouth daily. 30 tablet 11   losartan (COZAAR) 25 MG tablet Take 1 tablet (25 mg total) by mouth once daily 91 tablet 3   pantoprazole (PROTONIX) 40 MG tablet Take 1 tablet (40 mg total) by mouth once daily 90 tablet 3   pantoprazole (PROTONIX) 40 MG tablet Take 1 tablet (40 mg total) by mouth once daily 90 tablet 3   pantoprazole (PROTONIX) 40 MG tablet Take 1 tablet (40 mg total) by mouth 2 (two) times daily before meals. 180 tablet 3   pramipexole (MIRAPEX) 0.25 MG tablet TAKE 2 TABLETS BY MOUTH NIGHTLY FOR 30 DAYS. PLEASE TAKE 1 & 1/2 TABLETS AS NEEDED. 90 tablet 5   pramipexole (MIRAPEX) 0.25 MG tablet Take 1 tablet by mouth at bedtime 180 tablet 3   pramipexole (MIRAPEX) 0.25 MG tablet Take 1 tablet (0.25 mg total) by mouth at bedtime. 180 tablet 3   pramipexole (MIRAPEX) 0.25 MG tablet Take 1-1.5 tablets (0.25-0.375 mg total) by mouth at bedtime. 135 tablet 3   pravastatin (PRAVACHOL) 40 MG tablet Take 1 tablet (40 mg total) by mouth daily. Please call to schedule office visit for further refills. Thank you! 30 tablet 0   pravastatin (PRAVACHOL) 40 MG tablet Take 1 tablet (40 mg total) by mouth at bedtime. 90 tablet 3   pravastatin (PRAVACHOL) 40 MG tablet Take 1 tablet (40 mg total) by mouth at bedtime 90 tablet 3   sucralfate (CARAFATE) 1 g tablet Take 1 tablet (1 g total) by mouth 4 (four) times daily -   before meals and at bedtime for 7 days. 28 tablet 7   sucralfate (CARAFATE) 1 g tablet Take 1 tablet (1 g total) by mouth 4 (four) times daily. 60 tablet 6   tiZANidine (ZANAFLEX) 2 MG tablet Take 1 tablet (2 mg total) by mouth 3 (three) times daily as needed. 60 tablet 1   zolpidem (AMBIEN CR) 6.25 MG CR tablet Take 6.25 mg by mouth at bedtime as needed for sleep.      zolpidem (AMBIEN CR) 6.25  MG CR tablet TAKE 1 TABLET BY MOUTH NIGHTLY AS NEEDED FOR SLEEP 30 tablet 5   No current facility-administered medications for this visit.    Allergies:   Penicillins, Pristiq [desvenlafaxine], and Rosuvastatin    Social History:  The patient  reports that she has never smoked. She has never used smokeless tobacco. She reports that she does not drink alcohol and does not use drugs.   Family History:  The patient's family history includes Breast cancer in her cousin; Breast cancer (age of onset: 17) in her maternal aunt; CAD in her mother; Heart attack in her father; Stroke in her mother.    ROS:  Please see the history of present illness.   Otherwise, review of systems are positive for none.   All other systems are reviewed and negative.    PHYSICAL EXAM: VS:  BP (!) 150/80 (BP Location: Left Arm, Patient Position: Sitting)   Pulse 69   Ht 5\' 5"  (1.651 m)   Wt 178 lb 3.2 oz (80.8 kg)   LMP 02/13/2012   SpO2 99%   BMI 29.65 kg/m  , BMI Body mass index is 29.65 kg/m. GEN: Well nourished, well developed, in no acute distress  HEENT: normal  Neck: no JVD, carotid bruits, or masses Cardiac: RRR; no murmurs, rubs, or gallops,no edema  Respiratory:  clear to auscultation bilaterally, normal work of breathing GI: soft, nontender, nondistended, + BS MS: no deformity or atrophy  Skin: warm and dry, no rash Neuro:  Strength and sensation are intact Psych: euthymic mood, full affect   EKG:  EKG is ordered today. The ekg ordered today demonstrates: Normal sinus rhythm with sinus arrhythmia Low  voltage QRS       Recent Labs: 08/24/2023: BUN 12; Creatinine, Ser 0.67; Potassium 3.9; Sodium 142    Lipid Panel    Component Value Date/Time   CHOL 211 (H) 06/15/2018 1953   CHOL 169 09/12/2011 0820   TRIG 58 06/15/2018 1953   TRIG 77 09/12/2011 0820   HDL 65 06/15/2018 1953   HDL 60 09/12/2011 0820   CHOLHDL 3.2 06/15/2018 1953   VLDL 12 06/15/2018 1953   VLDL 15 09/12/2011 0820   LDLCALC 134 (H) 06/15/2018 1953   LDLCALC 94 09/12/2011 0820      Wt Readings from Last 3 Encounters:  08/24/23 178 lb 3.2 oz (80.8 kg)  10/07/22 169 lb 12.1 oz (77 kg)  12/13/18 163 lb 12 oz (74.3 kg)          12/13/2018    2:16 PM  PAD Screen  Previous PAD dx? No  Previous surgical procedure? No  Pain with walking? No  Feet/toe relief with dangling? No  Painful, non-healing ulcers? No  Extremities discolored? No      ASSESSMENT AND PLAN:  1.  Coronary artery disease involving native coronary arteries with other forms of angina: Previous cardiac CTA in 2020 showed mild nonobstructive disease.  She is now presenting with recent anginal symptoms which requires further investigation.  I recommend evaluation with cardiac CTA.  Continue low-dose aspirin. Given associated exertional dyspnea and previous PFO on CT, I requested an echocardiogram.  2. Status post thalamic stroke: Most likely due to small vessel disease.  No evidence of atrial fibrillation so far although she does report intermittent palpitations mainly when she is on an airplane  3.  Hyperlipidemia: I reviewed most recent lipid profile which showed an LDL of 74.  If cardiac CTA shows progression of coronary artery disease, we  will plan on switching her to a more potent statin.  4.  Essential hypertension: Blood pressure is mildly elevated but will monitor for now.  Continue losartan.    Disposition:   FU with me in 6 months.  Signed,  Lorine Bears, MD  08/25/2023 7:59 AM    [facility]

## 2023-08-24 NOTE — Patient Instructions (Addendum)
 Medication Instructions:  No changes *If you need a refill on your cardiac medications before your next appointment, please call your pharmacy*   Lab Work: You may need to get a BMET prior to the CT If you have labs (blood work) drawn today and your tests are completely normal, you will receive your results only by: MyChart Message (if you have MyChart) OR A paper copy in the mail If you have any lab test that is abnormal or we need to change your treatment, we will call you to review the results.   Testing/Procedures: Your physician has requested that you have an echocardiogram. Echocardiography is a painless test that uses sound waves to create images of your heart. It provides your doctor with information about the size and shape of your heart and how well your heart's chambers and valves are working.   You may receive an ultrasound enhancing agent through an IV if needed to better visualize your heart during the echo. This procedure takes approximately one hour.  There are no restrictions for this procedure.  This will take place at [address] [facility] [address] [facility], [place] [address]  Please note: We ask at that you not bring children with you during ultrasound (echo/ vascular) testing. Due to room size and safety concerns, children are not allowed in the ultrasound rooms during exams. Our front office staff cannot provide observation of children in our lobby area while testing is being conducted. An adult accompanying a patient to their appointment will only be allowed in the ultrasound room at the discretion of the ultrasound technician under special circumstances. We apologize for any inconvenience.    Follow-Up: At [facility], you and your health needs are our priority.  As part of our continuing mission to provide you with exceptional heart care, we have created designated Provider Care Teams.  These Care Teams include your primary Cardiologist  (physician) and Advanced Practice Providers (APPs -  Physician Assistants and Nurse Practitioners) who all work together to provide you with the care you need, when you need it.  We recommend signing up for the patient portal called "MyChart".  Sign up information is provided on this After Visit Summary.  MyChart is used to connect with patients for Virtual Visits (Telemedicine).  Patients are able to view lab/test results, encounter notes, upcoming appointments, etc.  Non-urgent messages can be sent to your provider as well.   To learn more about what you can do with MyChart, go to [email or web address].    Your next appointment:   6 month(s)  Provider:   Dr. Kirke Corin  Other Instructions   Your cardiac CT will be scheduled at one of the below locations:   [facility] [address] [place], [place] [address] [phone]  OR  [facility] [address] [place], [place] [address] [phone]  OR   [facility] [address] [place], [place] [address] [phone]  OR   [facility] [address] [place], [place] [address] [phone]  If scheduled at [facility], please arrive at the [facility] and Children's Entrance (Entrance C2) of [facility] 30 minutes prior to test start time. You can use the FREE valet parking offered at entrance C (encouraged to control the heart rate for the test)  Proceed to the [facility] Radiology Department (first floor) to check-in and test prep.  All radiology patients and guests should use  entrance C2 at [facility], accessed from [facility], even though the hospital's physical address listed is [address]    If scheduled at [facility] or [facility], please arrive 15 mins early for check-in and test prep.  There is spacious parking and easy  access to the radiology department from the [facility] Heart and Vascular entrance. Please enter here and check-in with the desk attendant.   If scheduled at [facility], please arrive 30 minutes early for check-in and test prep.  Please follow these instructions carefully (unless otherwise directed):  An IV will be required for this test and Nitroglycerin will be given.   On the Night Before the Test: Be sure to Drink plenty of water. Do not consume any caffeinated/decaffeinated beverages or chocolate 12 hours prior to your test. Do not take any antihistamines 12 hours prior to your test.   On the Day of the Test: Drink plenty of water until 1 hour prior to the test. Do not eat any food 1 hour prior to test. You may take your regular medications prior to the test.  Take metoprolol (Lopressor) two hours prior to test. Patients who wear a continuous glucose monitor MUST remove the device prior to scanning. FEMALES- please wear underwire-free bra if available, avoid dresses & tight clothing  After the Test: Drink plenty of water. After receiving IV contrast, you may experience a mild flushed feeling. This is normal. On occasion, you may experience a mild rash up to 24 hours after the test. This is not dangerous. If this occurs, you can take Benadryl 25 mg, Zyrtec, Claritin, or Allegra and increase your fluid intake. (Patients taking Tikosyn should avoid Benadryl, and may take Zyrtec, Claritin, or Allegra) If you experience trouble breathing, this can be serious. If it is severe call 911 IMMEDIATELY. If it is mild, please call our office.  We will call to schedule your test 2-4 weeks out understanding that some insurance companies will need an authorization prior to the service being performed.   For more information and frequently asked questions, please visit our website : [email or web address]  For non-scheduling related questions, please contact the cardiac imaging  nurse navigator should you have any questions/concerns: Cardiac Imaging Nurse Navigators Direct Office Dial: [phone]   For scheduling needs, including cancellations and rescheduling, please call [place], [phone].

## 2023-08-25 LAB — BASIC METABOLIC PANEL WITH GFR
BUN/Creatinine Ratio: 18 (ref 12–28)
BUN: 12 mg/dL (ref 8–27)
CO2: 22 mmol/L (ref 20–29)
Calcium: 9.3 mg/dL (ref 8.7–10.3)
Chloride: 105 mmol/L (ref 96–106)
Creatinine, Ser: 0.67 mg/dL (ref 0.57–1.00)
Glucose: 92 mg/dL (ref 70–99)
Potassium: 3.9 mmol/L (ref 3.5–5.2)
Sodium: 142 mmol/L (ref 134–144)
eGFR: 99 mL/min/{1.73_m2} (ref >59)

## 2023-08-29 ENCOUNTER — Encounter (HOSPITAL_COMMUNITY): Payer: Self-pay

## 2023-08-30 ENCOUNTER — Telehealth (HOSPITAL_COMMUNITY): Payer: Self-pay | Admitting: *Deleted

## 2023-08-30 ENCOUNTER — Other Ambulatory Visit: Payer: Self-pay

## 2023-08-30 NOTE — Telephone Encounter (Signed)

## 2023-08-31 ENCOUNTER — Ambulatory Visit
Admission: RE | Admit: 2023-08-31 | Discharge: 2023-08-31 | Disposition: A | Discharge status: Home / Self Care | Source: Ambulatory Visit | Attending: Cardiovascular Disease | Admitting: Cardiovascular Disease

## 2023-08-31 DIAGNOSIS — R072 Precordial pain: Secondary | ICD-10-CM | POA: Insufficient documentation

## 2023-08-31 MED ORDER — DILTIAZEM HCL 25 MG/5ML IV SOLN: 10.0000 mg | INTRAVENOUS | Status: DC | PRN

## 2023-08-31 MED ORDER — NITROGLYCERIN 0.4 MG SL SUBL
0.8000 mg | SUBLINGUAL_TABLET | Freq: Once | SUBLINGUAL | Status: AC
  Administered 2023-08-31: 0.8 mg via SUBLINGUAL

## 2023-08-31 MED ORDER — IOHEXOL 350 MG/ML SOLN
75.0000 mL | Freq: Once | INTRAVENOUS | Status: AC | PRN
  Administered 2023-08-31: 75 mL via INTRAVENOUS

## 2023-08-31 MED ORDER — METOPROLOL TARTRATE 5 MG/5ML IV SOLN
10.0000 mg | Freq: Once | INTRAVENOUS | Status: AC | PRN
  Administered 2023-08-31: 10 mg via INTRAVENOUS

## 2023-08-31 MED ORDER — SODIUM CHLORIDE 0.9 % IV SOLN: INTRAVENOUS | Status: DC

## 2023-08-31 NOTE — Progress Notes (Signed)
 Patient tolerated CT well. Drank water after. Vital signs stable encourage to drink water throughout day.Reasons explained and verbalized understanding. Ambulated steady gait.

## 2023-09-07 ENCOUNTER — Other Ambulatory Visit: Payer: Self-pay

## 2023-09-07 ENCOUNTER — Ambulatory Visit: Admission: RE | Admit: 2023-09-07 | Source: Ambulatory Visit

## 2023-09-07 MED ORDER — AZITHROMYCIN 250 MG PO TABS
ORAL_TABLET | ORAL | 0 refills | Status: DC
Start: 2023-09-07 — End: 2023-11-28
  Filled 2023-09-07: qty 6, 5d supply, fill #0

## 2023-09-07 MED ORDER — CHLORHEXIDINE GLUCONATE 0.12 % MT SOLN
15.0000 mL | Freq: Two times a day (BID) | OROMUCOSAL | 12 refills | Status: AC
  Filled 2023-09-07: qty 473, 16d supply, fill #0

## 2023-09-11 ENCOUNTER — Other Ambulatory Visit: Payer: Self-pay

## 2023-09-11 DIAGNOSIS — R7303 Prediabetes: Secondary | ICD-10-CM | POA: Diagnosis not present

## 2023-09-11 DIAGNOSIS — E7849 Other hyperlipidemia: Secondary | ICD-10-CM | POA: Diagnosis not present

## 2023-09-11 MED ORDER — ZOLPIDEM TARTRATE ER 6.25 MG PO TBCR
6.2500 mg | EXTENDED_RELEASE_TABLET | Freq: Every evening | ORAL | 5 refills | Status: DC | PRN
Start: 2023-09-11 — End: 2024-03-29
  Filled 2023-09-12: qty 30, 30d supply, fill #0
  Filled 2023-10-15: qty 30, 30d supply, fill #1
  Filled 2023-11-14: qty 30, 30d supply, fill #2
  Filled 2023-12-17 – 2023-12-22 (×2): qty 30, 30d supply, fill #3
  Filled 2024-01-25: qty 30, 30d supply, fill #4
  Filled 2024-02-26: qty 30, 30d supply, fill #5

## 2023-09-12 ENCOUNTER — Other Ambulatory Visit: Payer: Self-pay

## 2023-09-15 ENCOUNTER — Ambulatory Visit: Attending: Cardiovascular Disease

## 2023-09-15 DIAGNOSIS — R0602 Shortness of breath: Secondary | ICD-10-CM

## 2023-09-15 LAB — ECHOCARDIOGRAM COMPLETE
AR max vel: 3.48 cm2
AV Area VTI: 4 cm2
AV Area mean vel: 3.43 cm2
AV Mean grad: 3 mmHg
AV Peak grad: 5.8 mmHg
Ao pk vel: 1.2 m/s
Area-P 1/2: 4.89 cm2
Calc EF: 55.4 %
S' Lateral: 3 cm
Single Plane A2C EF: 52.6 %
Single Plane A4C EF: 54.8 %

## 2023-09-18 DIAGNOSIS — E7849 Other hyperlipidemia: Secondary | ICD-10-CM | POA: Diagnosis not present

## 2023-09-18 DIAGNOSIS — I10 Essential (primary) hypertension: Secondary | ICD-10-CM | POA: Diagnosis not present

## 2023-09-18 DIAGNOSIS — I2510 Atherosclerotic heart disease of native coronary artery without angina pectoris: Secondary | ICD-10-CM | POA: Diagnosis not present

## 2023-09-18 DIAGNOSIS — Z8673 Personal history of transient ischemic attack (TIA), and cerebral infarction without residual deficits: Secondary | ICD-10-CM | POA: Diagnosis not present

## 2023-09-18 DIAGNOSIS — M519 Unspecified thoracic, thoracolumbar and lumbosacral intervertebral disc disorder: Secondary | ICD-10-CM | POA: Diagnosis not present

## 2023-09-18 DIAGNOSIS — F3289 Other specified depressive episodes: Secondary | ICD-10-CM | POA: Diagnosis not present

## 2023-09-18 DIAGNOSIS — K219 Gastro-esophageal reflux disease without esophagitis: Secondary | ICD-10-CM | POA: Diagnosis not present

## 2023-09-18 DIAGNOSIS — Z0000 Encounter for general adult medical examination without abnormal findings: Secondary | ICD-10-CM | POA: Diagnosis not present

## 2023-09-18 DIAGNOSIS — R7303 Prediabetes: Secondary | ICD-10-CM | POA: Diagnosis not present

## 2023-10-15 ENCOUNTER — Other Ambulatory Visit: Payer: Self-pay

## 2023-10-16 ENCOUNTER — Other Ambulatory Visit: Payer: Self-pay

## 2023-10-16 MED ORDER — ESTRADIOL 10 MCG VA TABS
10.0000 ug | ORAL_TABLET | VAGINAL | 0 refills | Status: DC
  Filled 2023-10-16: qty 24, 84d supply, fill #0

## 2023-10-30 ENCOUNTER — Other Ambulatory Visit: Payer: Self-pay

## 2023-10-31 ENCOUNTER — Other Ambulatory Visit: Payer: Self-pay

## 2023-10-31 MED ORDER — LOSARTAN POTASSIUM 100 MG PO TABS
100.0000 mg | ORAL_TABLET | Freq: Every day | ORAL | 3 refills | Status: AC
  Filled 2023-10-31: qty 90, 90d supply, fill #0
  Filled 2024-01-25: qty 90, 90d supply, fill #1
  Filled 2024-04-28: qty 90, 90d supply, fill #2

## 2023-10-31 MED ORDER — PREDNISONE 10 MG PO TABS
ORAL_TABLET | ORAL | 0 refills | Status: DC
  Filled 2023-10-31: qty 20, 8d supply, fill #0

## 2023-11-15 ENCOUNTER — Other Ambulatory Visit: Payer: Self-pay

## 2023-11-28 ENCOUNTER — Other Ambulatory Visit: Payer: Self-pay

## 2023-11-28 DIAGNOSIS — I2510 Atherosclerotic heart disease of native coronary artery without angina pectoris: Secondary | ICD-10-CM | POA: Diagnosis not present

## 2023-11-28 DIAGNOSIS — I10 Essential (primary) hypertension: Secondary | ICD-10-CM | POA: Diagnosis not present

## 2023-11-28 DIAGNOSIS — R7303 Prediabetes: Secondary | ICD-10-CM | POA: Diagnosis not present

## 2023-11-28 DIAGNOSIS — Z03818 Encounter for observation for suspected exposure to other biological agents ruled out: Secondary | ICD-10-CM | POA: Diagnosis not present

## 2023-11-28 DIAGNOSIS — J069 Acute upper respiratory infection, unspecified: Secondary | ICD-10-CM | POA: Diagnosis not present

## 2023-11-28 MED ORDER — IPRATROPIUM BROMIDE 0.06 % NA SOLN
2.0000 | Freq: Two times a day (BID) | NASAL | 1 refills | Status: AC
  Filled 2023-11-28: qty 15, 38d supply, fill #0

## 2023-11-28 MED ORDER — PREDNISONE 10 MG PO TABS
ORAL_TABLET | ORAL | 0 refills | Status: AC
  Filled 2023-11-28: qty 20, 8d supply, fill #0

## 2023-11-28 MED ORDER — AZITHROMYCIN 250 MG PO TABS
ORAL_TABLET | ORAL | 0 refills | Status: AC
  Filled 2023-11-28: qty 6, 5d supply, fill #0

## 2023-11-28 MED ORDER — HYDROCOD POLI-CHLORPHE POLI ER 10-8 MG/5ML PO SUER
5.0000 mL | Freq: Two times a day (BID) | ORAL | 0 refills | Status: DC | PRN
  Filled 2023-11-28: qty 115, 12d supply, fill #0

## 2023-12-17 ENCOUNTER — Other Ambulatory Visit: Payer: Self-pay

## 2023-12-18 ENCOUNTER — Other Ambulatory Visit: Payer: Self-pay

## 2023-12-22 ENCOUNTER — Other Ambulatory Visit: Payer: Self-pay

## 2024-01-19 ENCOUNTER — Other Ambulatory Visit: Payer: Self-pay

## 2024-01-19 MED ORDER — CELECOXIB 200 MG PO CAPS
200.0000 mg | ORAL_CAPSULE | Freq: Two times a day (BID) | ORAL | 3 refills | Status: AC | PRN
  Filled 2024-01-19: qty 180, 90d supply, fill #0

## 2024-01-26 ENCOUNTER — Other Ambulatory Visit: Payer: Self-pay

## 2024-02-26 ENCOUNTER — Other Ambulatory Visit: Payer: Self-pay

## 2024-02-26 MED ORDER — PRAMIPEXOLE DIHYDROCHLORIDE 0.25 MG PO TABS
0.2500 mg | ORAL_TABLET | Freq: Every day | ORAL | 3 refills | Status: AC
  Filled 2024-02-26 – 2024-02-27 (×2): qty 135, 90d supply, fill #0
  Filled 2024-04-28: qty 135, 90d supply, fill #1
  Filled 2024-06-05: qty 45, 30d supply, fill #1
  Filled ????-??-??: fill #1

## 2024-02-27 ENCOUNTER — Other Ambulatory Visit: Payer: Self-pay

## 2024-03-12 DIAGNOSIS — R7303 Prediabetes: Secondary | ICD-10-CM | POA: Diagnosis not present

## 2024-03-12 DIAGNOSIS — E7849 Other hyperlipidemia: Secondary | ICD-10-CM | POA: Diagnosis not present

## 2024-03-12 DIAGNOSIS — I10 Essential (primary) hypertension: Secondary | ICD-10-CM | POA: Diagnosis not present

## 2024-03-19 ENCOUNTER — Other Ambulatory Visit: Payer: Self-pay

## 2024-03-19 DIAGNOSIS — M2550 Pain in unspecified joint: Secondary | ICD-10-CM | POA: Diagnosis not present

## 2024-03-19 DIAGNOSIS — K219 Gastro-esophageal reflux disease without esophagitis: Secondary | ICD-10-CM | POA: Diagnosis not present

## 2024-03-19 DIAGNOSIS — Z1331 Encounter for screening for depression: Secondary | ICD-10-CM | POA: Diagnosis not present

## 2024-03-19 DIAGNOSIS — E7849 Other hyperlipidemia: Secondary | ICD-10-CM | POA: Diagnosis not present

## 2024-03-19 DIAGNOSIS — F3289 Other specified depressive episodes: Secondary | ICD-10-CM | POA: Diagnosis not present

## 2024-03-19 DIAGNOSIS — M519 Unspecified thoracic, thoracolumbar and lumbosacral intervertebral disc disorder: Secondary | ICD-10-CM | POA: Diagnosis not present

## 2024-03-19 DIAGNOSIS — I2510 Atherosclerotic heart disease of native coronary artery without angina pectoris: Secondary | ICD-10-CM | POA: Diagnosis not present

## 2024-03-19 DIAGNOSIS — I10 Essential (primary) hypertension: Secondary | ICD-10-CM | POA: Diagnosis not present

## 2024-03-19 DIAGNOSIS — R7303 Prediabetes: Secondary | ICD-10-CM | POA: Diagnosis not present

## 2024-03-19 DIAGNOSIS — Z8673 Personal history of transient ischemic attack (TIA), and cerebral infarction without residual deficits: Secondary | ICD-10-CM | POA: Diagnosis not present

## 2024-03-19 MED ORDER — PREDNISONE 10 MG PO TABS
ORAL_TABLET | ORAL | 0 refills | Status: AC
  Filled 2024-03-19: qty 20, 8d supply, fill #0

## 2024-03-28 ENCOUNTER — Other Ambulatory Visit: Payer: Self-pay

## 2024-03-29 ENCOUNTER — Other Ambulatory Visit: Payer: Self-pay

## 2024-03-29 MED ORDER — ZOLPIDEM TARTRATE ER 6.25 MG PO TBCR
6.2500 mg | EXTENDED_RELEASE_TABLET | Freq: Every evening | ORAL | 5 refills | Status: AC | PRN
  Filled 2024-03-29: qty 30, 30d supply, fill #0
  Filled 2024-04-28: qty 30, 30d supply, fill #1
  Filled 2024-06-05: qty 30, 30d supply, fill #2

## 2024-04-28 ENCOUNTER — Other Ambulatory Visit: Payer: Self-pay

## 2024-04-29 ENCOUNTER — Other Ambulatory Visit: Payer: Self-pay

## 2024-04-29 MED ORDER — TIZANIDINE HCL 2 MG PO TABS
2.0000 mg | ORAL_TABLET | Freq: Three times a day (TID) | ORAL | 1 refills | Status: AC | PRN
  Filled 2024-04-29: qty 60, 20d supply, fill #0

## 2024-04-29 MED ORDER — PRAVASTATIN SODIUM 40 MG PO TABS
40.0000 mg | ORAL_TABLET | Freq: Every day | ORAL | 3 refills | Status: AC
  Filled 2024-04-29: qty 90, 90d supply, fill #0

## 2024-04-30 ENCOUNTER — Other Ambulatory Visit: Payer: Self-pay

## 2024-04-30 MED ORDER — PREDNISONE 10 MG PO TABS
ORAL_TABLET | ORAL | 0 refills | Status: AC
  Filled 2024-04-30: qty 20, 8d supply, fill #0

## 2024-04-30 MED ORDER — ESTRADIOL 10 MCG VA TABS
10.0000 ug | ORAL_TABLET | VAGINAL | 0 refills | Status: AC
  Filled 2024-04-30: qty 24, 84d supply, fill #0

## 2024-04-30 MED ORDER — AMITRIPTYLINE HCL 25 MG PO TABS
25.0000 mg | ORAL_TABLET | Freq: Every day | ORAL | 0 refills | Status: AC
  Filled 2024-04-30: qty 90, 90d supply, fill #0

## 2024-06-05 ENCOUNTER — Ambulatory Visit: Payer: Commercial Managed Care - PPO | Admitting: Dermatology

## 2024-06-05 ENCOUNTER — Encounter: Payer: Self-pay | Admitting: Dermatology

## 2024-06-05 DIAGNOSIS — L820 Inflamed seborrheic keratosis: Secondary | ICD-10-CM

## 2024-06-05 DIAGNOSIS — L821 Other seborrheic keratosis: Secondary | ICD-10-CM

## 2024-06-05 DIAGNOSIS — Z1283 Encounter for screening for malignant neoplasm of skin: Secondary | ICD-10-CM

## 2024-06-05 DIAGNOSIS — L814 Other melanin hyperpigmentation: Secondary | ICD-10-CM

## 2024-06-05 DIAGNOSIS — D1801 Hemangioma of skin and subcutaneous tissue: Secondary | ICD-10-CM | POA: Diagnosis not present

## 2024-06-05 DIAGNOSIS — Z7189 Other specified counseling: Secondary | ICD-10-CM

## 2024-06-05 DIAGNOSIS — W908XXA Exposure to other nonionizing radiation, initial encounter: Secondary | ICD-10-CM

## 2024-06-05 DIAGNOSIS — D2372 Other benign neoplasm of skin of left lower limb, including hip: Secondary | ICD-10-CM

## 2024-06-05 DIAGNOSIS — Z85828 Personal history of other malignant neoplasm of skin: Secondary | ICD-10-CM | POA: Diagnosis not present

## 2024-06-05 DIAGNOSIS — Z8589 Personal history of malignant neoplasm of other organs and systems: Secondary | ICD-10-CM

## 2024-06-05 DIAGNOSIS — L578 Other skin changes due to chronic exposure to nonionizing radiation: Secondary | ICD-10-CM

## 2024-06-05 DIAGNOSIS — D229 Melanocytic nevi, unspecified: Secondary | ICD-10-CM

## 2024-06-05 NOTE — Progress Notes (Signed)
 "  Follow-Up Visit   Subjective  Lori Morales is a 64 y.o. female who presents for the following: Skin Cancer Screening and Full Body Skin Exam, hx of SCC.  The patient presents for Total-Body Skin Exam (TBSE) for skin cancer screening and mole check. The patient has spots, moles and lesions to be evaluated, some may be new or changing and the patient may have concern these could be cancer.  The following portions of the chart were reviewed this encounter and updated as appropriate: medications, allergies, medical history  Review of Systems:  No other skin or systemic complaints except as noted in HPI or Assessment and Plan.  Objective  Well appearing patient in no apparent distress; mood and affect are within normal limits.  A full examination was performed including scalp, head, eyes, ears, nose, lips, neck, chest, axillae, abdomen, back, buttocks, bilateral upper extremities, bilateral lower extremities, hands, feet, fingers, toes, fingernails, and toenails. All findings within normal limits unless otherwise noted below.   Relevant physical exam findings are noted in the Assessment and Plan.  Exam of nails limited by presence of nail polish.  R pretibial x1, R ant shoulder x1, L cheek x1, mid chest x1 (4) Erythematous keratotic or waxy stuck-on papule or plaque.   Assessment & Plan   SKIN CANCER SCREENING PERFORMED TODAY.  ACTINIC DAMAGE - Chronic condition, secondary to cumulative UV/sun exposure - diffuse scaly erythematous macules with underlying dyspigmentation - Recommend daily broad spectrum sunscreen SPF 30+ to sun-exposed areas, reapply every 2 hours as needed.  - Staying in the shade or wearing long sleeves, sun glasses (UVA+UVB protection) and wide brim hats (4-inch brim around the entire circumference of the hat) are also recommended for sun protection.  - Call for new or changing lesions.  LENTIGINES, SEBORRHEIC KERATOSES, HEMANGIOMAS - Benign normal skin  lesions - Benign-appearing - Call for any changes  MELANOCYTIC NEVI - Tan-brown and/or pink-flesh-colored symmetric macules and papules - Benign appearing on exam today - Observation - Call clinic for new or changing moles - Recommend daily use of broad spectrum spf 30+ sunscreen to sun-exposed areas.  - Check nails when remove polish.   HISTORY OF SQUAMOUS CELL CARCINOMA OF THE SKIN. Right distal dorsum lateral forearm. Tx 07/11/2016. - No evidence of recurrence today - No lymphadenopathy - Recommend regular full body skin exams - Recommend daily broad spectrum sunscreen SPF 30+ to sun-exposed areas, reapply every 2 hours as needed.  - Call if any new or changing lesions are noted between office visits    BLUE NEVUS L ant medial ankle Present for many years without change Exam: blue gray macule 0.3cm L ant medial ankle Treatment Plan: Benign-appearing.  Observation.  Call clinic for new or changing moles.  Recommend daily use of broad spectrum spf 30+ sunscreen to sun-exposed areas.   LENTIGINES Exam: scattered tan macules Due to sun exposure Treatment Plan: Benign-appearing, observe. Recommend daily broad spectrum sunscreen SPF 30+ to sun-exposed areas, reapply every 2 hours as needed.  Call for any changes  Counseling for BBL / IPL / Laser and Coordination of Care Discussed the treatment option of Broad Band Light (BBL) /Intense Pulsed Light (IPL)/ Laser for skin discoloration, including brown spots and redness.  Typically we recommend at least 1-3 treatment sessions about 5-8 weeks apart for best results.  Cannot have tanned skin when BBL performed, and regular use of sunscreen/photoprotection is advised after the procedure to help maintain results. The patient's condition may also require maintenance  treatments in the future.  The fee for BBL / laser treatments is $350 per treatment session for the whole face.  A fee can be quoted for other parts of the body.  Insurance  typically does not pay for BBL/laser treatments and therefore the fee is an out-of-pocket cost. Recommend prophylactic valtrex  treatment. Once scheduled for procedure, will send Rx in prior to patient's appointment.   INFLAMED SEBORRHEIC KERATOSIS (4) R pretibial x1, R ant shoulder x1, L cheek x1, mid chest x1 (4) Symptomatic, irritating, patient would like treated. - Destruction of lesion - R pretibial x1, R ant shoulder x1, L cheek x1, mid chest x1 (4) Complexity: simple   Destruction method: cryotherapy   Informed consent: discussed and consent obtained   Timeout:  patient name, date of birth, surgical site, and procedure verified Lesion destroyed using liquid nitrogen: Yes   Region frozen until ice ball extended beyond lesion: Yes   Outcome: patient tolerated procedure well with no complications   Post-procedure details: wound care instructions given   Additional details:  Prior to procedure, discussed risks of blister formation, small wound, skin dyspigmentation, or rare scar following cryotherapy. Recommend Vaseline ointment to treated areas while healing.   Return in about 1 year (around 06/05/2025) for TBSE, HxSCC.  I, Jill Parcell, CMA, am acting as scribe for Alm Rhyme, MD.    Documentation: I have reviewed the above documentation for accuracy and completeness, and I agree with the above.  Alm Rhyme, MD    "

## 2024-06-05 NOTE — Patient Instructions (Addendum)
 Cryotherapy Aftercare  Wash gently with soap and water everyday.   Apply Vaseline jelly daily until healed.     Recommend daily broad spectrum sunscreen SPF 30+ to sun-exposed areas, reapply every 2 hours as needed. Call for new or changing lesions.  Staying in the shade or wearing long sleeves, sun glasses (UVA+UVB protection) and wide brim hats (4-inch brim around the entire circumference of the hat) are also recommended for sun protection.      Melanoma ABCDEs  Melanoma is the most dangerous type of skin cancer, and is the leading cause of death from skin disease.  You are more likely to develop melanoma if you: Have light-colored skin, light-colored eyes, or red or blond hair Spend a lot of time in the sun Tan regularly, either outdoors or in a tanning bed Have had blistering sunburns, especially during childhood Have a close family member who has had a melanoma Have atypical moles or large birthmarks  Early detection of melanoma is key since treatment is typically straightforward and cure rates are extremely high if we catch it early.   The first sign of melanoma is often a change in a mole or a new dark spot.  The ABCDE system is a way of remembering the signs of melanoma.  A for asymmetry:  The two halves do not match. B for border:  The edges of the growth are irregular. C for color:  A mixture of colors are present instead of an even brown color. D for diameter:  Melanomas are usually (but not always) greater than 6mm - the size of a pencil eraser. E for evolution:  The spot keeps changing in size, shape, and color.  Please check your skin once per month between visits. You can use a small mirror in front and a large mirror behind you to keep an eye on the back side or your body.   If you see any new or changing lesions before your next follow-up, please call to schedule a visit.  Please continue daily skin protection including broad spectrum sunscreen SPF 30+ to  sun-exposed areas, reapplying every 2 hours as needed when you're outdoors.   Staying in the shade or wearing long sleeves, sun glasses (UVA+UVB protection) and wide brim hats (4-inch brim around the entire circumference of the hat) are also recommended for sun protection.      Due to recent changes in healthcare laws, you may see results of your pathology and/or laboratory studies on MyChart before the doctors have had a chance to review them. We understand that in some cases there may be results that are confusing or concerning to you. Please understand that not all results are received at the same time and often the doctors may need to interpret multiple results in order to provide you with the best plan of care or course of treatment. Therefore, we ask that you please give us  2 business days to thoroughly review all your results before contacting the office for clarification. Should we see a critical lab result, you will be contacted sooner.   If You Need Anything After Your Visit  If you have any questions or concerns for your doctor, please call our main line at [phone] and press option 4 to reach your doctor's medical assistant. If no one answers, please leave a voicemail as directed and we will return your call as soon as possible. Messages left after 4 pm will be answered the following business day.   You may also send  us  a message via MyChart. We typically respond to MyChart messages within 1-2 business days.  For prescription refills, please ask your pharmacy to contact our office. Our fax number is [phone].  If you have an urgent issue when the clinic is closed that cannot wait until the next business day, you can page your doctor at the number below.    Please note that while we do our best to be available for urgent issues outside of office hours, we are not available 24/7.   If you have an urgent issue and are unable to reach us , you may choose to seek medical care at  your doctor's office, retail clinic, urgent care center, or emergency room.  If you have a medical emergency, please immediately call 911 or go to the emergency department.  Pager Numbers  - Dr. Hester: [phone]  - Dr. Jackquline: [phone]  - Dr. Claudene: [phone]   - Dr. Raymund: [phone]  In the event of inclement weather, please call our main line at [phone] for an update on the status of any delays or closures.  Dermatology Medication Tips: Please keep the boxes that topical medications come in in order to help keep track of the instructions about where and how to use these. Pharmacies typically print the medication instructions only on the boxes and not directly on the medication tubes.   If your medication is too expensive, please contact our office at [phone] option 4 or send us  a message through MyChart.   We are unable to tell what your co-pay for medications will be in advance as this is different depending on your insurance coverage. However, we may be able to find a substitute medication at lower cost or fill out paperwork to get insurance to cover a needed medication.   If a prior authorization is required to get your medication covered by your insurance company, please allow us  1-2 business days to complete this process.  Drug prices often vary depending on where the prescription is filled and some pharmacies may offer cheaper prices.  The website www.goodrx.com contains coupons for medications through different pharmacies. The prices here do not account for what the cost may be with help from insurance (it may be cheaper with your insurance), but the website can give you the price if you did not use any insurance.  - You can print the associated coupon and take it with your prescription to the pharmacy.  - You may also stop by our office during regular business hours and pick up a GoodRx coupon card.  - If you need your prescription sent  electronically to a different pharmacy, notify our office through [facility] or by phone at [phone] option 4.     Si Usted Necesita Algo Despus de Su Visita  Tambin puede enviarnos un mensaje a travs de Clinical Cytogeneticist. Por lo general respondemos a los mensajes de MyChart en el transcurso de 1 a 2 das hbiles.  Para renovar recetas, por favor pida a su farmacia que se ponga en contacto con nuestra oficina. Randi lakes de fax es Francis [phone].  Si tiene un asunto urgente cuando la clnica est cerrada y que no puede esperar hasta el siguiente da hbil, puede llamar/localizar a su doctor(a) al nmero que aparece a continuacin.   Por favor, tenga en cuenta que aunque hacemos todo lo posible para estar disponibles para asuntos urgentes fuera del horario de [place], no estamos disponibles las 24 horas del da, los 7 [address]  [address] de la  [place]a.   Si tiene un problema urgente y no puede comunicarse con nosotros, puede optar por buscar atencin mdica  en el consultorio de su doctor(a), en una clnica privada, en un centro de atencin urgente o en una sala de emergencias.  Si tiene engineer, drilling, por favor llame inmediatamente al 911 o vaya a la sala de emergencias.  Nmeros de bper  - Dr. Hester: [phone]  - Dra. Jackquline: [phone]  - Dr. Claudene: [phone]  - Dra. Kitts: [phone]  En caso de inclemencias del [place], por favor llame a nuestra lnea principal al [phone] para una actualizacin sobre el estado de cualquier retraso o cierre.  Consejos para la medicacin en dermatologa: Por favor, guarde las cajas en las que vienen los medicamentos de uso tpico para ayudarle a seguir las instrucciones sobre dnde y cmo usarlos. Las farmacias generalmente imprimen las instrucciones del medicamento slo en las cajas y no directamente en los tubos del [place].   Si su medicamento es muy caro, por favor, pngase en contacto con landry rieger llamando al  [phone] y presione la opcin 4 o envenos un mensaje a travs de Clinical Cytogeneticist.   No podemos decirle cul ser su copago por los medicamentos por adelantado ya que esto es diferente dependiendo de la cobertura de su seguro. Sin embargo, es posible que podamos encontrar un medicamento sustituto a audiological scientist un formulario para que el seguro cubra el medicamento que se considera necesario.   Si se requiere una autorizacin previa para que su compaa de seguros cubra su medicamento, por favor permtanos de 1 a 2 das hbiles para completar este proceso.  Los precios de los medicamentos varan con frecuencia dependiendo del environmental consultant de dnde se surte la receta y alguna farmacias pueden ofrecer precios ms baratos.  El sitio web www.goodrx.com tiene cupones para medicamentos de health and safety inspector. Los precios aqu no tienen en cuenta lo que podra costar con la ayuda del seguro (puede ser ms barato con su seguro), pero el sitio web puede darle el precio si no utiliz tourist information centre manager.  - Puede imprimir el cupn correspondiente y llevarlo con su receta a la farmacia.  - Tambin puede pasar por nuestra oficina durante el horario de atencin regular y education officer, museum una tarjeta de cupones de GoodRx.  - Si necesita que su receta se enve electrnicamente a una farmacia diferente, informe a nuestra oficina a travs de MyChart de [place] o por telfono llamando al [phone] y presione la opcin 4.

## 2024-06-06 ENCOUNTER — Other Ambulatory Visit: Payer: Self-pay

## 2024-06-06 ENCOUNTER — Other Ambulatory Visit: Payer: Self-pay | Admitting: Rheumatology

## 2024-06-06 DIAGNOSIS — R911 Solitary pulmonary nodule: Secondary | ICD-10-CM

## 2024-06-07 ENCOUNTER — Encounter: Payer: Self-pay | Admitting: Rheumatology

## 2024-06-11 ENCOUNTER — Inpatient Hospital Stay: Admission: RE | Admit: 2024-06-11 | Discharge: 2024-06-11 | Attending: Rheumatology | Admitting: Rheumatology

## 2024-06-11 DIAGNOSIS — R911 Solitary pulmonary nodule: Secondary | ICD-10-CM

## 2025-06-05 ENCOUNTER — Ambulatory Visit: Admitting: Dermatology
